# Patient Record
Sex: Female | Born: 1960 | Race: Black or African American | Hispanic: No | Marital: Married | Smoking: Never smoker
Health system: Southern US, Community
[De-identification: ages and names within clinical notes are randomized; demographics above are authoritative.]

## PROBLEM LIST (undated history)

## (undated) DIAGNOSIS — G4733 Obstructive sleep apnea (adult) (pediatric): Secondary | ICD-10-CM

## (undated) DIAGNOSIS — R0602 Shortness of breath: Secondary | ICD-10-CM

## (undated) DIAGNOSIS — M1990 Unspecified osteoarthritis, unspecified site: Secondary | ICD-10-CM

## (undated) DIAGNOSIS — I9589 Other hypotension: Secondary | ICD-10-CM

## (undated) DIAGNOSIS — D509 Iron deficiency anemia, unspecified: Secondary | ICD-10-CM

## (undated) DIAGNOSIS — E669 Obesity, unspecified: Secondary | ICD-10-CM

## (undated) DIAGNOSIS — Z9989 Dependence on other enabling machines and devices: Secondary | ICD-10-CM

## (undated) DIAGNOSIS — K219 Gastro-esophageal reflux disease without esophagitis: Secondary | ICD-10-CM

## (undated) DIAGNOSIS — N186 End stage renal disease: Secondary | ICD-10-CM

## (undated) DIAGNOSIS — Z992 Dependence on renal dialysis: Secondary | ICD-10-CM

## (undated) DIAGNOSIS — I10 Essential (primary) hypertension: Secondary | ICD-10-CM

## (undated) DIAGNOSIS — E1169 Type 2 diabetes mellitus with other specified complication: Secondary | ICD-10-CM

## (undated) DIAGNOSIS — I2699 Other pulmonary embolism without acute cor pulmonale: Secondary | ICD-10-CM

## (undated) DIAGNOSIS — I871 Compression of vein: Secondary | ICD-10-CM

## (undated) DIAGNOSIS — T4145XA Adverse effect of unspecified anesthetic, initial encounter: Secondary | ICD-10-CM

## (undated) DIAGNOSIS — J449 Chronic obstructive pulmonary disease, unspecified: Secondary | ICD-10-CM

## (undated) DIAGNOSIS — J189 Pneumonia, unspecified organism: Secondary | ICD-10-CM

## (undated) HISTORY — PX: THROMBECTOMY AND REVISION OF ARTERIOVENTOUS (AV) GORETEX  GRAFT: SHX6120

## (undated) HISTORY — PX: CARPAL TUNNEL RELEASE: SHX101

## (undated) HISTORY — PX: ARTERIOVENOUS GRAFT PLACEMENT: SUR1029

## (undated) HISTORY — PX: VASCULAR SURGERY: SHX849

---

## 1992-12-10 DIAGNOSIS — Z992 Dependence on renal dialysis: Secondary | ICD-10-CM | POA: Insufficient documentation

## 1993-02-07 HISTORY — PX: PARATHYROIDECTOMY: SHX19

## 1998-12-14 DIAGNOSIS — Z9851 Tubal ligation status: Secondary | ICD-10-CM | POA: Insufficient documentation

## 1998-12-14 DIAGNOSIS — I120 Hypertensive chronic kidney disease with stage 5 chronic kidney disease or end stage renal disease: Secondary | ICD-10-CM | POA: Insufficient documentation

## 1998-12-14 DIAGNOSIS — G5600 Carpal tunnel syndrome, unspecified upper limb: Secondary | ICD-10-CM | POA: Insufficient documentation

## 1998-12-14 DIAGNOSIS — N269 Renal sclerosis, unspecified: Secondary | ICD-10-CM | POA: Insufficient documentation

## 1999-05-17 ENCOUNTER — Ambulatory Visit (HOSPITAL_COMMUNITY): Admission: RE | Admit: 1999-05-17 | Discharge: 1999-05-17 | Payer: Self-pay | Admitting: Nephrology

## 2000-03-20 ENCOUNTER — Ambulatory Visit: Admission: RE | Admit: 2000-03-20 | Discharge: 2000-03-20 | Payer: Self-pay | Admitting: Internal Medicine

## 2000-11-11 ENCOUNTER — Encounter: Admission: RE | Admit: 2000-11-11 | Discharge: 2001-02-09 | Payer: Self-pay | Admitting: *Deleted

## 2001-02-27 ENCOUNTER — Emergency Department (HOSPITAL_COMMUNITY): Admission: EM | Admit: 2001-02-27 | Discharge: 2001-02-27 | Payer: Self-pay | Admitting: Emergency Medicine

## 2001-02-28 ENCOUNTER — Encounter: Payer: Self-pay | Admitting: Nephrology

## 2001-02-28 ENCOUNTER — Inpatient Hospital Stay (HOSPITAL_COMMUNITY): Admission: EM | Admit: 2001-02-28 | Discharge: 2001-03-02 | Payer: Self-pay | Admitting: Emergency Medicine

## 2001-03-01 ENCOUNTER — Encounter: Payer: Self-pay | Admitting: Nephrology

## 2001-03-19 ENCOUNTER — Ambulatory Visit (HOSPITAL_COMMUNITY): Admission: RE | Admit: 2001-03-19 | Discharge: 2001-03-19 | Payer: Self-pay | Admitting: Nephrology

## 2001-03-19 ENCOUNTER — Encounter: Payer: Self-pay | Admitting: Nephrology

## 2001-05-16 ENCOUNTER — Encounter: Payer: Self-pay | Admitting: Nephrology

## 2001-05-16 ENCOUNTER — Encounter: Admission: RE | Admit: 2001-05-16 | Discharge: 2001-05-16 | Payer: Self-pay | Admitting: Nephrology

## 2001-07-02 ENCOUNTER — Ambulatory Visit (HOSPITAL_COMMUNITY): Admission: RE | Admit: 2001-07-02 | Discharge: 2001-07-02 | Payer: Self-pay | Admitting: Nephrology

## 2002-11-17 ENCOUNTER — Ambulatory Visit (HOSPITAL_COMMUNITY): Admission: RE | Admit: 2002-11-17 | Discharge: 2002-11-17 | Payer: Self-pay | Admitting: Nephrology

## 2003-01-25 ENCOUNTER — Encounter: Payer: Self-pay | Admitting: Vascular Surgery

## 2003-01-25 ENCOUNTER — Ambulatory Visit (HOSPITAL_COMMUNITY): Admission: RE | Admit: 2003-01-25 | Discharge: 2003-01-26 | Payer: Self-pay | Admitting: Vascular Surgery

## 2003-03-08 ENCOUNTER — Ambulatory Visit (HOSPITAL_COMMUNITY): Admission: RE | Admit: 2003-03-08 | Discharge: 2003-03-08 | Payer: Self-pay | Admitting: Nephrology

## 2003-03-19 ENCOUNTER — Emergency Department (HOSPITAL_COMMUNITY): Admission: EM | Admit: 2003-03-19 | Discharge: 2003-03-19 | Payer: Self-pay | Admitting: Emergency Medicine

## 2003-04-28 ENCOUNTER — Ambulatory Visit (HOSPITAL_COMMUNITY): Admission: RE | Admit: 2003-04-28 | Discharge: 2003-04-28 | Payer: Self-pay | Admitting: Nephrology

## 2003-05-14 ENCOUNTER — Ambulatory Visit (HOSPITAL_COMMUNITY): Admission: RE | Admit: 2003-05-14 | Discharge: 2003-05-14 | Payer: Self-pay | Admitting: Nephrology

## 2003-05-14 ENCOUNTER — Encounter: Payer: Self-pay | Admitting: Nephrology

## 2003-05-17 ENCOUNTER — Ambulatory Visit (HOSPITAL_COMMUNITY): Admission: RE | Admit: 2003-05-17 | Discharge: 2003-05-17 | Payer: Self-pay | Admitting: Vascular Surgery

## 2003-06-02 ENCOUNTER — Encounter: Payer: Self-pay | Admitting: Vascular Surgery

## 2003-06-02 ENCOUNTER — Ambulatory Visit (HOSPITAL_COMMUNITY): Admission: RE | Admit: 2003-06-02 | Discharge: 2003-06-02 | Payer: Self-pay | Admitting: Vascular Surgery

## 2003-06-04 ENCOUNTER — Ambulatory Visit (HOSPITAL_COMMUNITY): Admission: RE | Admit: 2003-06-04 | Discharge: 2003-06-04 | Payer: Self-pay | Admitting: Vascular Surgery

## 2003-07-21 ENCOUNTER — Ambulatory Visit (HOSPITAL_COMMUNITY): Admission: RE | Admit: 2003-07-21 | Discharge: 2003-07-21 | Payer: Self-pay | Admitting: *Deleted

## 2003-08-11 ENCOUNTER — Encounter: Payer: Self-pay | Admitting: Nephrology

## 2003-08-11 ENCOUNTER — Ambulatory Visit (HOSPITAL_COMMUNITY): Admission: RE | Admit: 2003-08-11 | Discharge: 2003-08-11 | Payer: Self-pay | Admitting: Nephrology

## 2004-01-24 ENCOUNTER — Ambulatory Visit (HOSPITAL_COMMUNITY): Admission: RE | Admit: 2004-01-24 | Discharge: 2004-01-24 | Payer: Self-pay | Admitting: Nephrology

## 2004-01-25 ENCOUNTER — Inpatient Hospital Stay (HOSPITAL_COMMUNITY): Admission: RE | Admit: 2004-01-25 | Discharge: 2004-01-27 | Payer: Self-pay | Admitting: Vascular Surgery

## 2004-02-28 ENCOUNTER — Ambulatory Visit (HOSPITAL_COMMUNITY): Admission: RE | Admit: 2004-02-28 | Discharge: 2004-02-28 | Payer: Self-pay | Admitting: Nephrology

## 2004-03-02 ENCOUNTER — Ambulatory Visit (HOSPITAL_COMMUNITY): Admission: RE | Admit: 2004-03-02 | Discharge: 2004-03-02 | Payer: Self-pay | Admitting: Vascular Surgery

## 2004-03-22 ENCOUNTER — Ambulatory Visit (HOSPITAL_COMMUNITY): Admission: RE | Admit: 2004-03-22 | Discharge: 2004-03-22 | Payer: Self-pay | Admitting: Nephrology

## 2004-03-23 ENCOUNTER — Encounter: Payer: Self-pay | Admitting: Vascular Surgery

## 2004-03-23 ENCOUNTER — Inpatient Hospital Stay (HOSPITAL_COMMUNITY): Admission: AD | Admit: 2004-03-23 | Discharge: 2004-03-23 | Payer: Self-pay | Admitting: Nephrology

## 2004-05-19 ENCOUNTER — Ambulatory Visit (HOSPITAL_COMMUNITY): Admission: RE | Admit: 2004-05-19 | Discharge: 2004-05-21 | Payer: Self-pay | Admitting: Vascular Surgery

## 2004-06-17 ENCOUNTER — Inpatient Hospital Stay (HOSPITAL_COMMUNITY): Admission: EM | Admit: 2004-06-17 | Discharge: 2004-06-22 | Payer: Self-pay | Admitting: Emergency Medicine

## 2004-09-22 ENCOUNTER — Ambulatory Visit (HOSPITAL_COMMUNITY): Admission: RE | Admit: 2004-09-22 | Discharge: 2004-09-22 | Payer: Self-pay | Admitting: Nephrology

## 2004-09-28 ENCOUNTER — Ambulatory Visit (HOSPITAL_COMMUNITY)
Admission: RE | Admit: 2004-09-28 | Discharge: 2004-09-28 | Payer: Self-pay | Admitting: Physical Medicine & Rehabilitation

## 2004-09-29 ENCOUNTER — Inpatient Hospital Stay (HOSPITAL_COMMUNITY): Admission: AD | Admit: 2004-09-29 | Discharge: 2004-10-06 | Payer: Self-pay | Admitting: Nephrology

## 2004-10-09 ENCOUNTER — Inpatient Hospital Stay (HOSPITAL_COMMUNITY): Admission: AD | Admit: 2004-10-09 | Discharge: 2004-10-11 | Payer: Self-pay | Admitting: Vascular Surgery

## 2004-10-10 ENCOUNTER — Encounter (INDEPENDENT_AMBULATORY_CARE_PROVIDER_SITE_OTHER): Payer: Self-pay | Admitting: Cardiology

## 2004-11-15 ENCOUNTER — Ambulatory Visit (HOSPITAL_COMMUNITY): Admission: RE | Admit: 2004-11-15 | Discharge: 2004-11-16 | Payer: Self-pay | Admitting: Nephrology

## 2004-12-13 ENCOUNTER — Ambulatory Visit (HOSPITAL_COMMUNITY): Admission: RE | Admit: 2004-12-13 | Discharge: 2004-12-13 | Payer: Self-pay | Admitting: Vascular Surgery

## 2005-01-11 ENCOUNTER — Inpatient Hospital Stay (HOSPITAL_COMMUNITY): Admission: AD | Admit: 2005-01-11 | Discharge: 2005-01-15 | Payer: Self-pay | Admitting: *Deleted

## 2005-01-11 ENCOUNTER — Encounter: Payer: Self-pay | Admitting: Nephrology

## 2005-01-29 ENCOUNTER — Ambulatory Visit (HOSPITAL_COMMUNITY): Admission: RE | Admit: 2005-01-29 | Discharge: 2005-01-29 | Payer: Self-pay | Admitting: Nephrology

## 2005-02-10 ENCOUNTER — Ambulatory Visit (HOSPITAL_COMMUNITY): Admission: AD | Admit: 2005-02-10 | Discharge: 2005-02-10 | Payer: Self-pay | Admitting: Vascular Surgery

## 2005-02-28 ENCOUNTER — Ambulatory Visit (HOSPITAL_COMMUNITY): Admission: RE | Admit: 2005-02-28 | Discharge: 2005-02-28 | Payer: Self-pay | Admitting: Nephrology

## 2005-03-14 ENCOUNTER — Ambulatory Visit (HOSPITAL_COMMUNITY): Admission: RE | Admit: 2005-03-14 | Discharge: 2005-03-15 | Payer: Self-pay | Admitting: Nephrology

## 2005-03-28 ENCOUNTER — Ambulatory Visit (HOSPITAL_COMMUNITY): Admission: RE | Admit: 2005-03-28 | Discharge: 2005-03-28 | Payer: Self-pay | Admitting: Nephrology

## 2005-04-20 ENCOUNTER — Ambulatory Visit (HOSPITAL_COMMUNITY): Admission: RE | Admit: 2005-04-20 | Discharge: 2005-04-20 | Payer: Self-pay | Admitting: Vascular Surgery

## 2005-04-27 ENCOUNTER — Ambulatory Visit (HOSPITAL_COMMUNITY): Admission: RE | Admit: 2005-04-27 | Discharge: 2005-04-27 | Payer: Self-pay | Admitting: Nephrology

## 2005-04-30 ENCOUNTER — Ambulatory Visit (HOSPITAL_COMMUNITY): Admission: RE | Admit: 2005-04-30 | Discharge: 2005-04-30 | Payer: Self-pay | Admitting: Vascular Surgery

## 2005-05-03 ENCOUNTER — Emergency Department (HOSPITAL_COMMUNITY): Admission: EM | Admit: 2005-05-03 | Discharge: 2005-05-03 | Payer: Self-pay | Admitting: Emergency Medicine

## 2005-05-09 ENCOUNTER — Inpatient Hospital Stay (HOSPITAL_COMMUNITY): Admission: EM | Admit: 2005-05-09 | Discharge: 2005-05-13 | Payer: Self-pay | Admitting: Emergency Medicine

## 2005-06-06 IMAGING — XA IR VENTRICULOPERITONEALSHUNT EVAL/INJ
1 series · 15 of 24 positions shown · non-contrast
Comparison: none

CLINICAL DATA: The patient has a history of end stage renal disease and is dialyzed through a left thigh gortex graft.  She has decreased clearances from the graft during hemodialysis.
PROCEDURE
LEFT THIGH DIALYSIS LEFT AV SHUNTOGRAM
ANGIOPLASTY OF ARTERIAL ANASTOMOSIS
ANGIOPLASTY OF VENOUS LIMB OF DIALYSIS GRAFT, 01/24/04
Comparison prior shuntogram performed at [HOSPITAL] on 08/11/03.
LEFT THIGH AV SHUNTOGRAM
An 18-gauge Angiocath was introduced along the arterial side of a loop graft in the left thigh. Contrast shuntogram was performed with full visualization of venous outflow through to the level of the right atrium of the heart.  The arterial anastomosis was studied during temporary pressure held at the apex of the graft.

[Series 1: run · 15 of 38 slices shown]
[im 1/38]
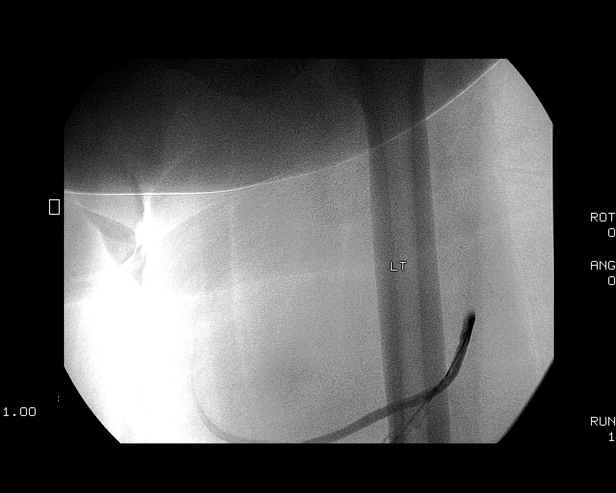
[im 4/38]
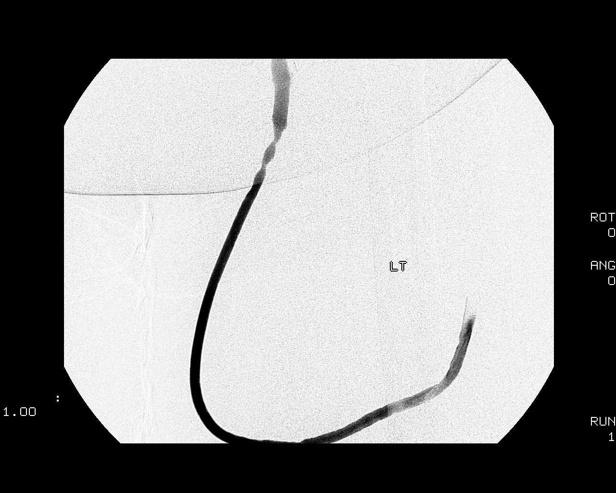
[im 7/38]
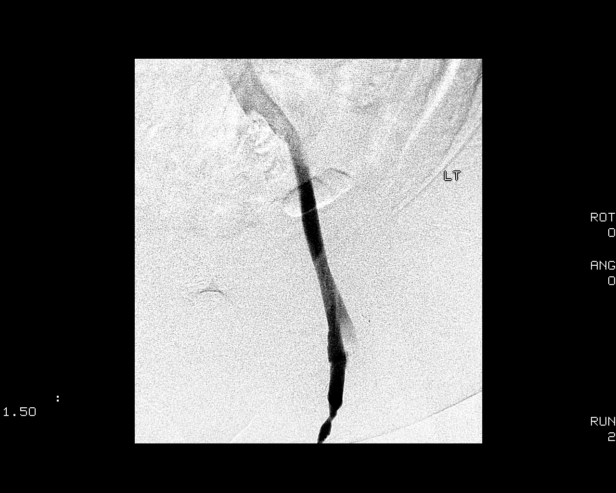
[im 9/38]
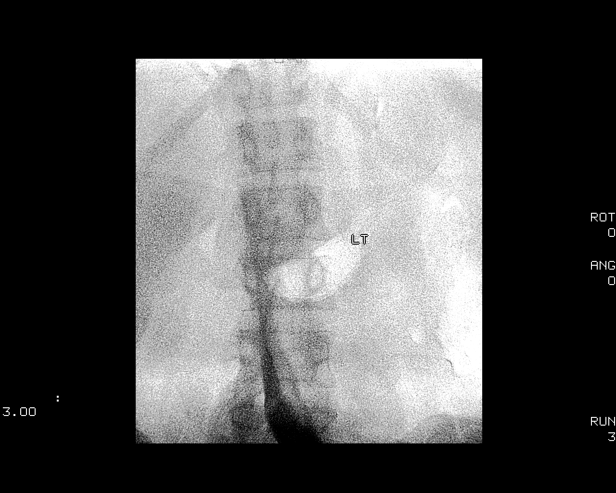
[im 12/38]
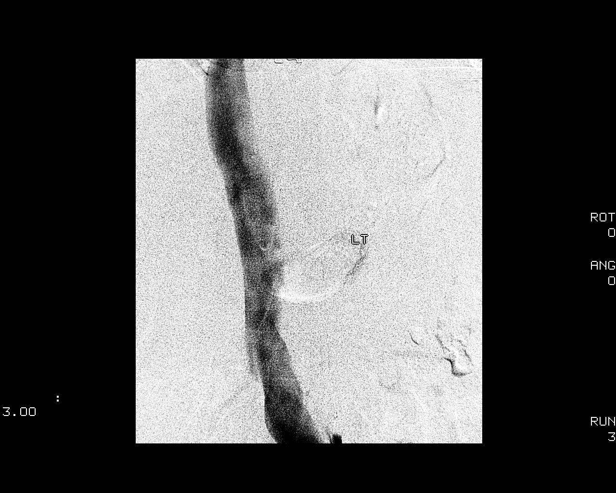
[im 13/38]
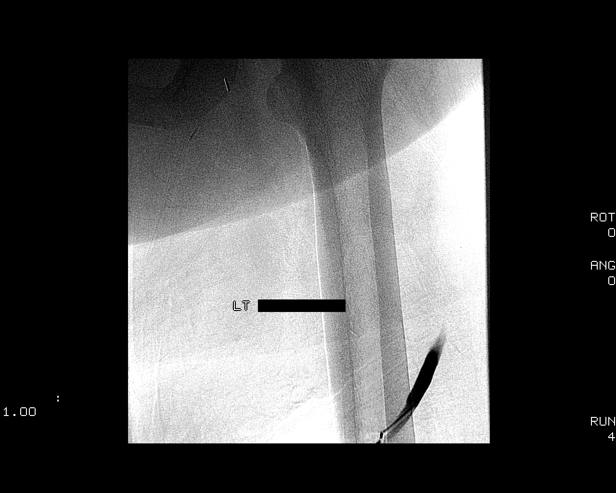
[im 17/38]
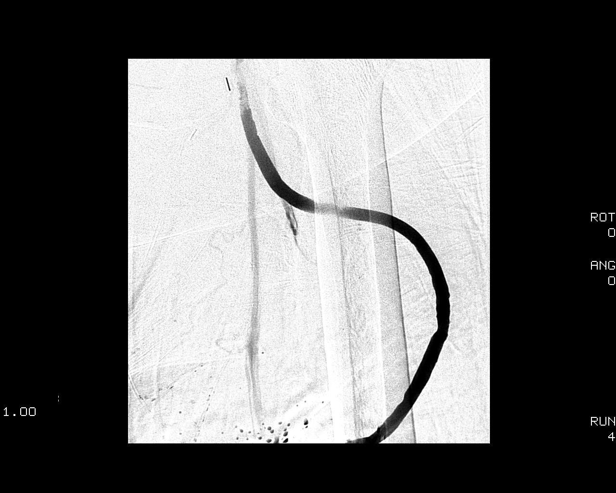
[im 20/38]
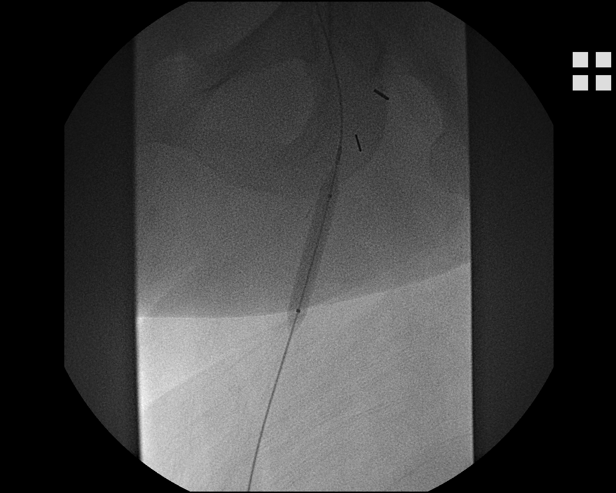
[im 21/38]
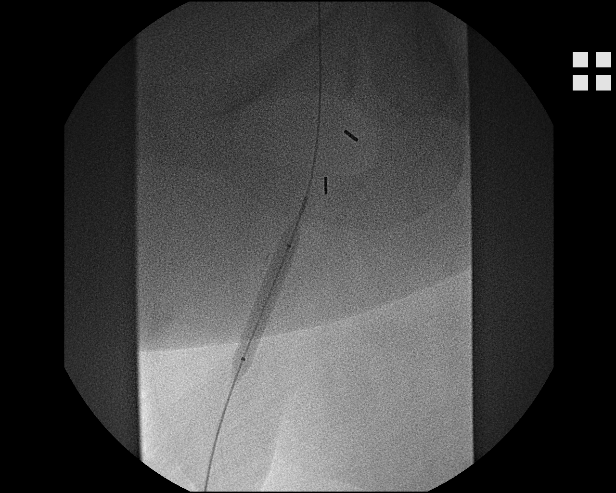
[im 25/38]
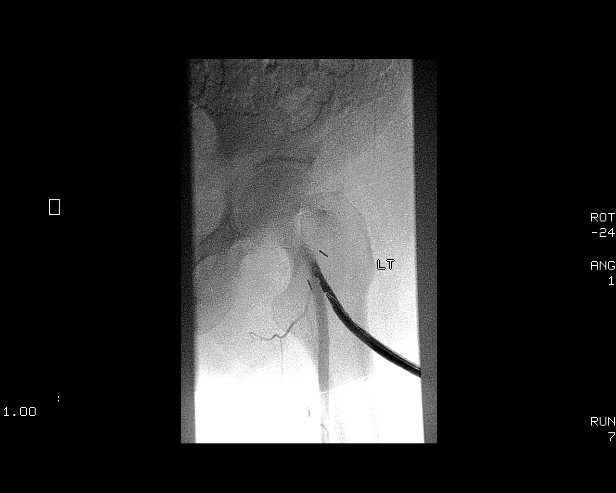
[im 26/38]
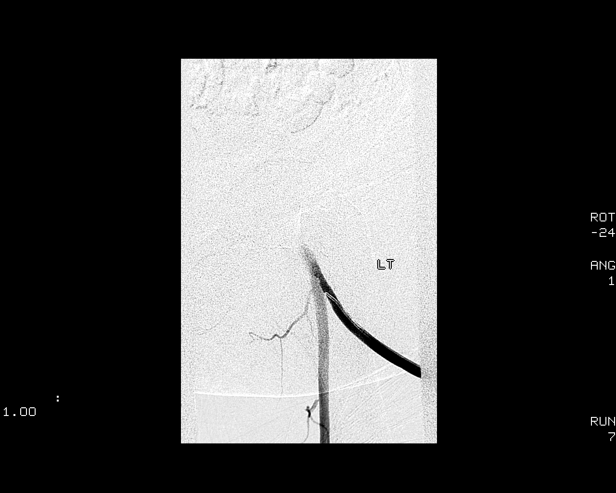
[im 29/38]
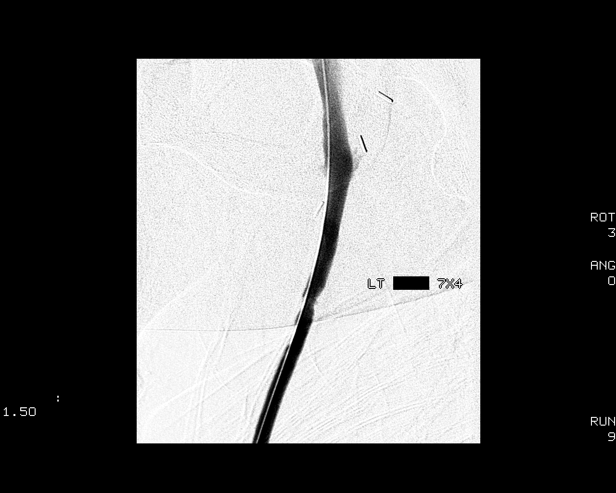
[im 33/38]
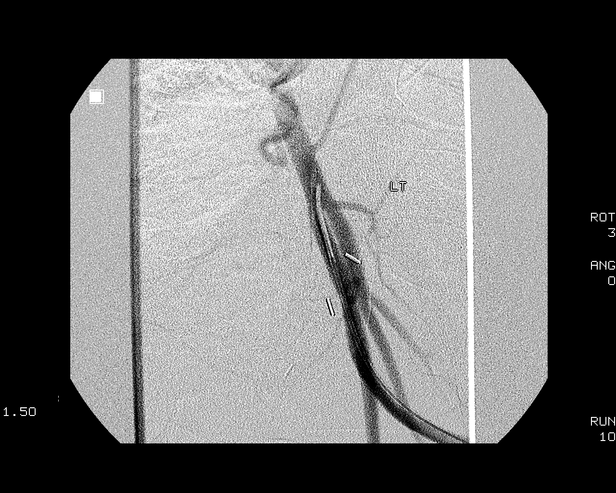
[im 34/38]
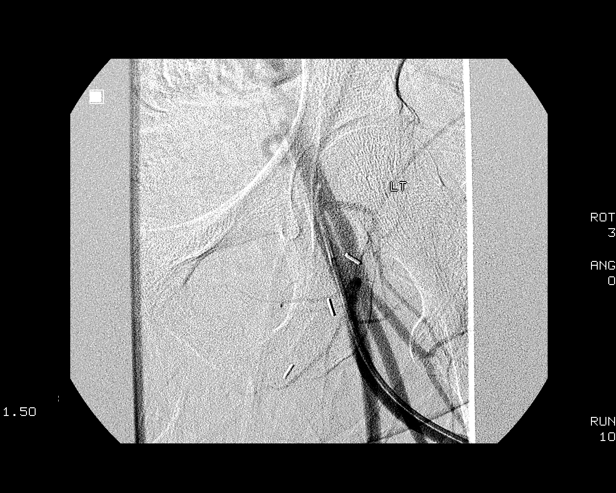
[im 38/38]
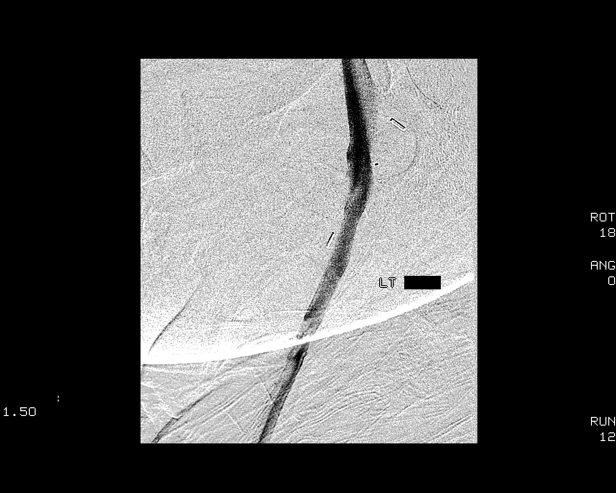

[15 of 24 positions shown; findings below may reference images not displayed]

FINDINGS: There are two significant tandem areas of stenosis, each approaching 75 percent in caliber within the distal end of the loop graft just prior to the venous anastomosis.  These are new when compared to the prior shuntogram.  The rest of the venous outflow is widely patent including the entire IVC.  
There remains irregular stenosis at the arterial anastomosis of the graft which was further characterized with a diagnostic catheter injection prior to angioplasty.  This stenosis does appear to have progressed since the prior shuntogram in [DATE] and is now estimated to be approximately 75 percent in caliber.  
IMPRESSION 
Significant stenosis at the arterial anastomosis which has progressed since the prior shuntogram and now is estimated at 75 percent.  
Tandem areas of 75 percent stenosis involving the distal end of the bypass graft just proximal to the venous anastomosis.
ANGIOPLASTY OF ARTERIAL ANASTOMOSIS
From a retrograde stick along the arterial limb of the graft used for the shuntogram, a guidewire was advanced into the graft.  A 6 French vascular sheath was placed.  A diagnostic catheter was advanced to the level of the arterial anastomosis and additional oblique angiography performed.  Balloon angioplasty was then performed across the arterial anastomosis utilizing a 6 mm x 4 cm high pressure Conquest angioplasty balloon.  This was inflated a single time then removed.  Additional follow-up angiography was then performed through a diagnostic catheter advanced just below the arterial anastomosis.
Prior to angioplasty, the patient received 4999 units of heparin through the graft.
FINDINGS: The arterial anastomotic stenosis did show response to angioplasty improving from an irregular roughly 75 percent stenosis to a final mild and smoother estimated 30 percent stenosis.  Although the graft material is 7 mm in diameter, the patient did experience focal pain during inflation of a 6 mm balloon and further dilatation to 7 mm was not performed, given the improved result.  
At some point after angioplasty of the arterial anastomosis, the patient did start to experience an allergic type reaction, complaining of throat swelling and itching.  This did improve after administration of 50 mg of Benadryl directly into the graft.  It is unclear whether this mild allergic response occurred as a result of the heparin administered or contrast.  The patient was observed for over one hour following the procedure and was stable.
IMPRESSION 
Improvement in stenosis of the arterial anastomosis after 6 mm angioplasty with reduction in stenosis from 75 percent to approximately 30 percent final residual estimated stenosis.  As above, the patient did have a mild allergic reaction to either heparin or contrast which did respond to administration of intravenous Benadryl.
ANGIOPLASTY OF VENOUS LIMB OF DIALYSIS GRAFT
A second puncture of the dialysis graft was performed near the apex in an antegrade direction with a micropuncture needle.  Eventually, a 6 French sheath was placed over a guidewire and angiography perfor[REDACTED]ed over the distal segment of the thigh graft.  Balloon angioplasty was performed of   the tandem areas of stenosis within the distal graft utilizing a 7 mm x 4 cm Conquest angioplasty balloon.  After two sustained inflations, the balloon was removed and repeat angiography performed through the sheath.
Upon completion of the procedure, both 6 French sheaths placed into the graft were removed and 0 silk purse string sutures at the exit sites.
FINDINGS: Areas of stenosis within the distal graft did respond to 7 mm angioplasty.  The higher of the two areas of stenosis showed essentially complete response with no residual stenosis remaining.  The area of stenosis lower in the thigh did show improvement with residual 25 to 30 percent stenosis remaining.  
IMPRESSION 
Improvement in tandem areas of stenosis within the distal segment of the dialysis graft.  One of these areas completely resolved after 7 mm angioplasty and the second area showed mild residual 25 to 30 percent stenosis remaining after balloon angioplasty.
cc: [REDACTED]

## 2005-06-08 IMAGING — CR DG CHEST 1V PORT
1 series · 1 of 1 positions shown · non-contrast
Comparison: 06/02/03.

CLINICAL DATA: Diatek catheter insertion.   
C-ARM FLUOROSCOPY 1-60 MINUTES
Fluoroscopy was utilized by the requesting physician.
IMPRESSION
No radiographic interpretation.
PORTABLE CHEST

[view not recorded]
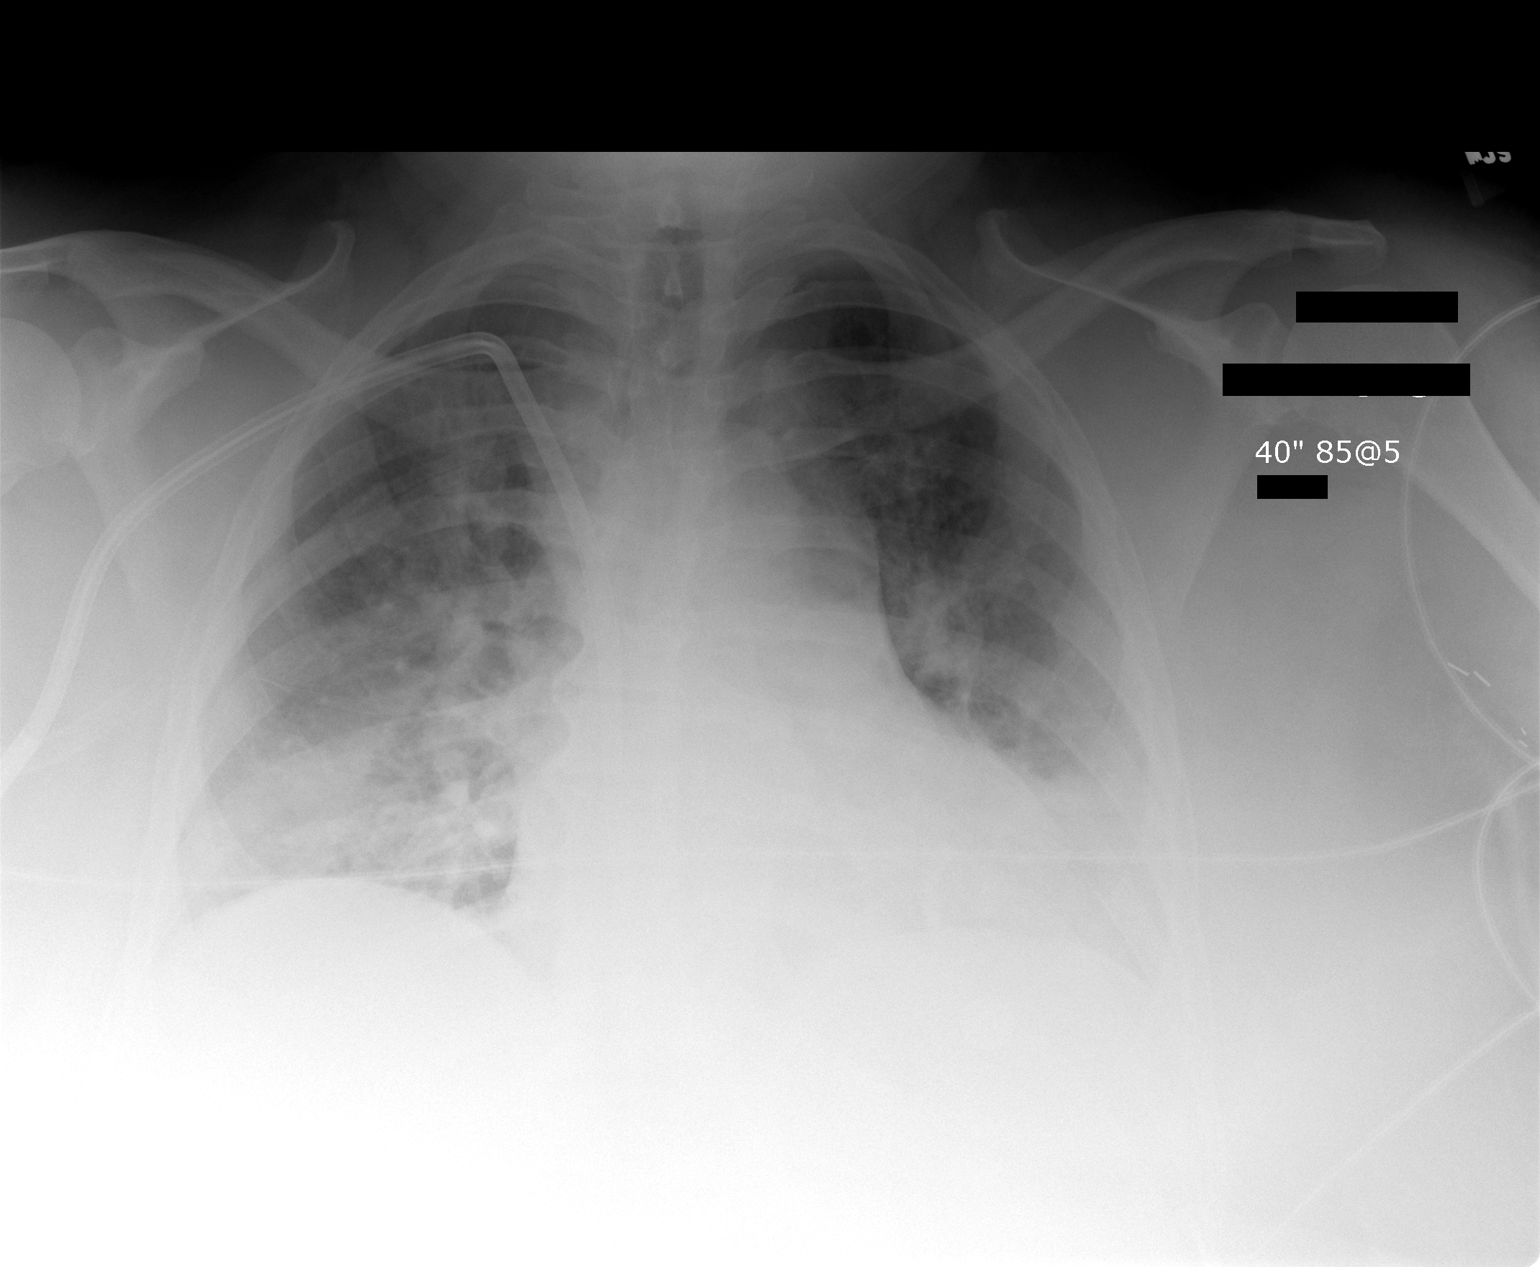

[1 of 1 positions shown; findings below may reference images not displayed]

AP semiupright chest obtained at [DATE] hours shows a left subclavian dialysis catheter in place.  The catheter extends into the right atrium although the extreme distal tip of the catheter cannot be resolved.  There is cardiomegaly with pulmonary edema and low lung volumes.  No evidence for pneumothorax. 
IMPRESSION
Right subclavian dialysis catheter extends into the right atrium although the very tip of the catheter cannot be discerned.  There is no evidence for pneumothorax. 
Pulmonary edema

## 2005-06-27 ENCOUNTER — Ambulatory Visit (HOSPITAL_COMMUNITY): Admission: RE | Admit: 2005-06-27 | Discharge: 2005-06-27 | Payer: Self-pay | Admitting: Nephrology

## 2005-06-28 ENCOUNTER — Inpatient Hospital Stay (HOSPITAL_COMMUNITY): Admission: AD | Admit: 2005-06-28 | Discharge: 2005-07-03 | Payer: Self-pay | Admitting: *Deleted

## 2005-08-04 IMAGING — XA IR VENTRICULOPERITONEALSHUNT EVAL/INJ
1 series · 12 of 24 positions shown · non-contrast
Comparison: none

CLINICAL DATA: Left thigh graft thrombosed.  
 LEFT THIGH AV GRAFT THROMBECTOMY AND THROMBOLYSIS, VENOUS ANGIOPLASTY, AV SHUNTOGRAM ([DATE] HOURS)
 Procedure:
 The procedure, its benefits and alternatives were explained to the patient.  She understood and consented. 
 The left thigh was prepped and draped in a sterile fashion.  Lidocaine was utilized for local anesthesia.  A micropuncture needle was inserted into the graft directed towards the venous anastomosis.  An 018 wire was advanced through the needle into the graft.  A micropuncture dilator was advanced over the wire to the graft.  A similar procedure was performed towards the arterial anastomosis.  4 mg TPA was administered into the graft. 
 The micropuncture dilators were exchanged over Bentson wire for six French sheaths.  Kumpe catheters were then utilized to advance Tiger wires across the venous end arterial anastomosis.  Zorniza Gonnelli catheter was advanced over the wire into the left common iliac vein.  Central venography was performed demonstrating patency.
 A 7 mm angioplasty device was then advanced to the venous anastomosis.  Angioplasty was performed. 
 A Fogarty balloon was then utilized to sweep the arterial limb of the graft and arterial plug.  A Fogarty balloon was then utilized to sweep the graft in an antegrade fashion across the venous anastomosis.  Repeat imaging demonstrates a focal persistent stenosis in the arterial limb of the graft.  Repeat angioplasty of the arterial limb of the graft was performed in this focal stenosis.  Repeat imaging demonstrates patency.  The sheaths were removed and hemostasis was achieved with two 3-0 Monocryl figure-of-eight stitches.  No complications were encountered.

[Series 1: run · 12 of 56 slices shown]
[im 3/56]
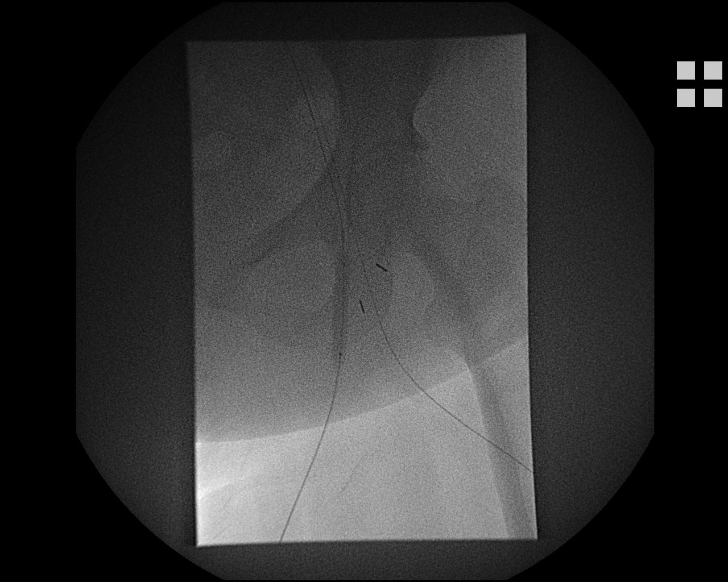
[im 8/56]
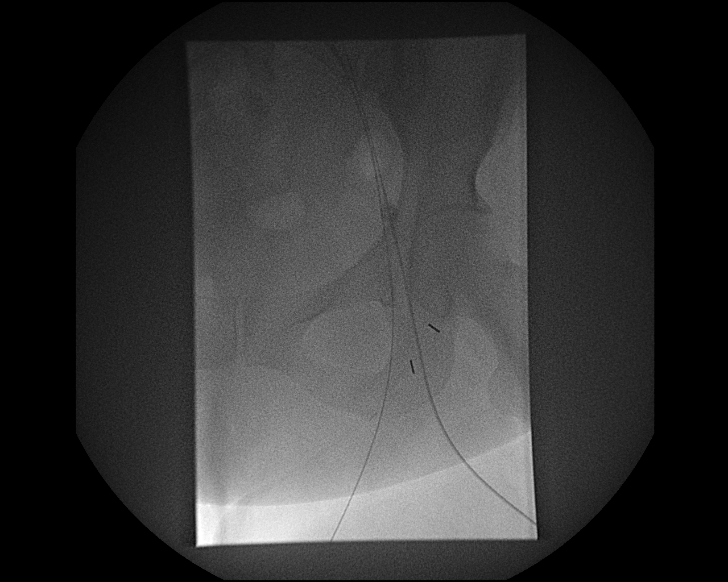
[im 12/56]
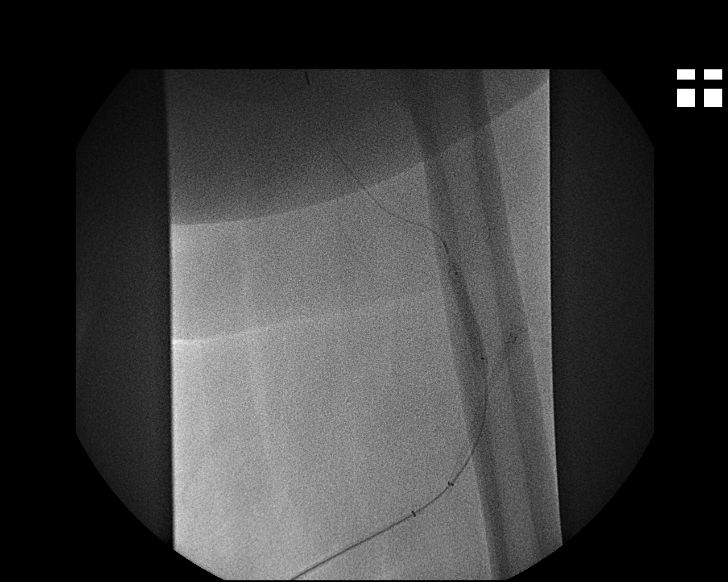
[im 17/56]
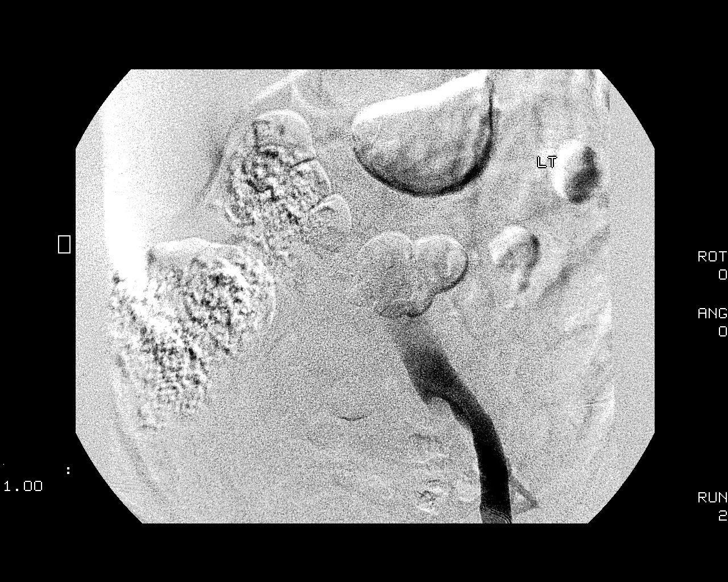
[im 22/56]
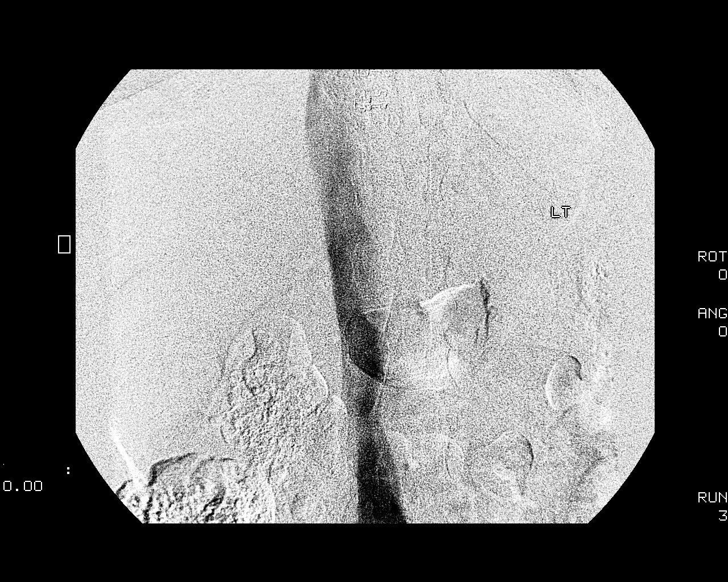
[im 27/56]
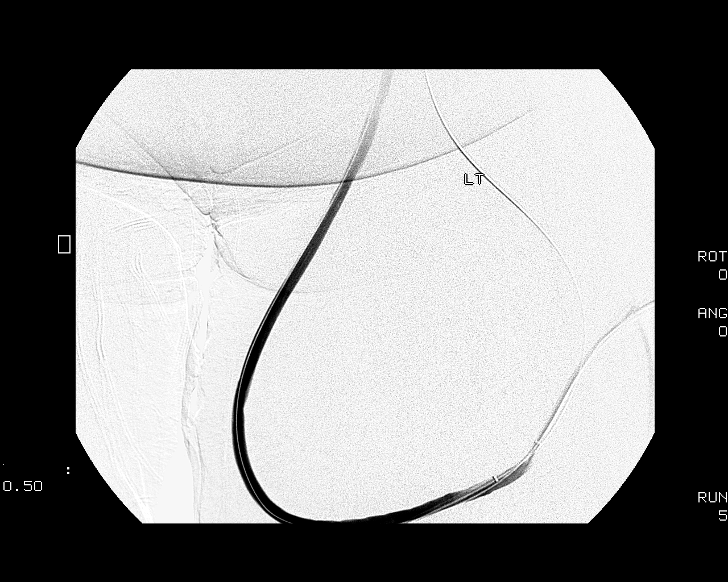
[im 32/56]
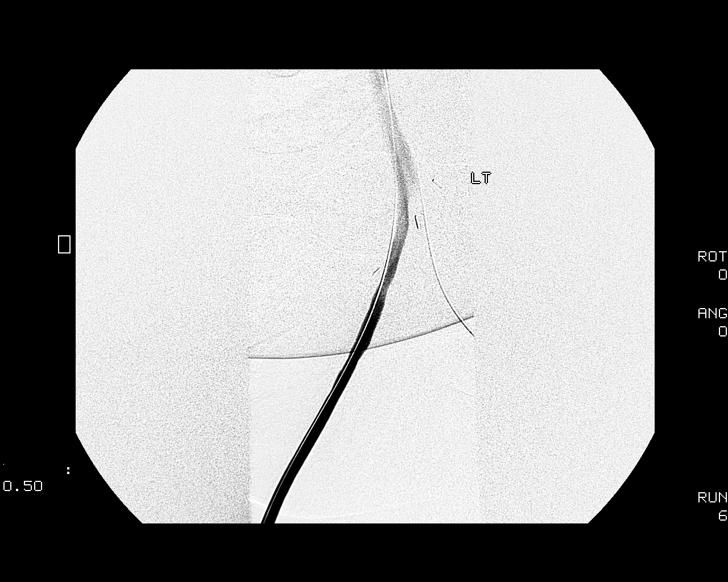
[im 36/56]
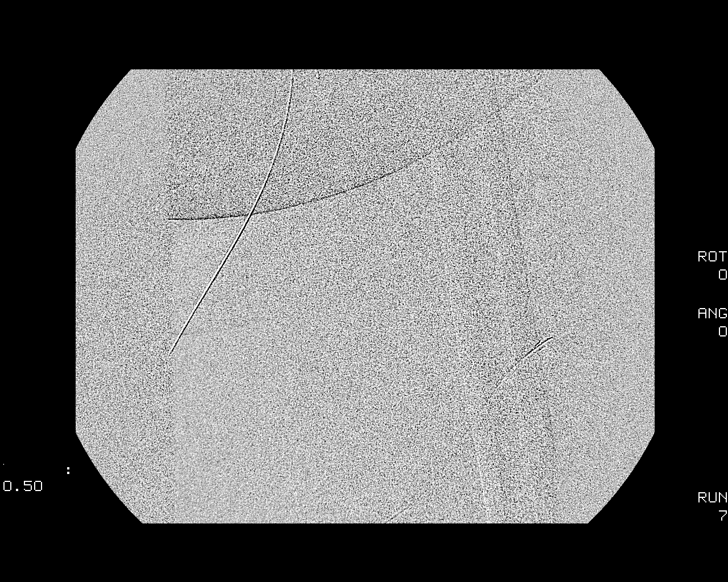
[im 41/56]
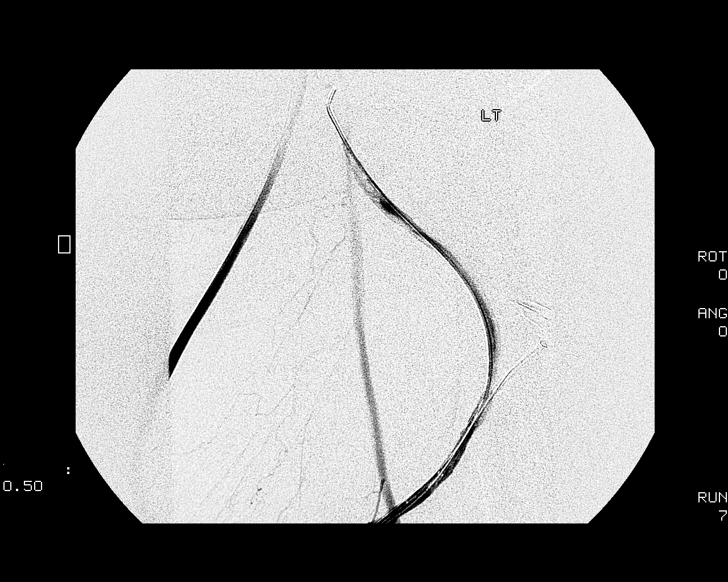
[im 46/56]
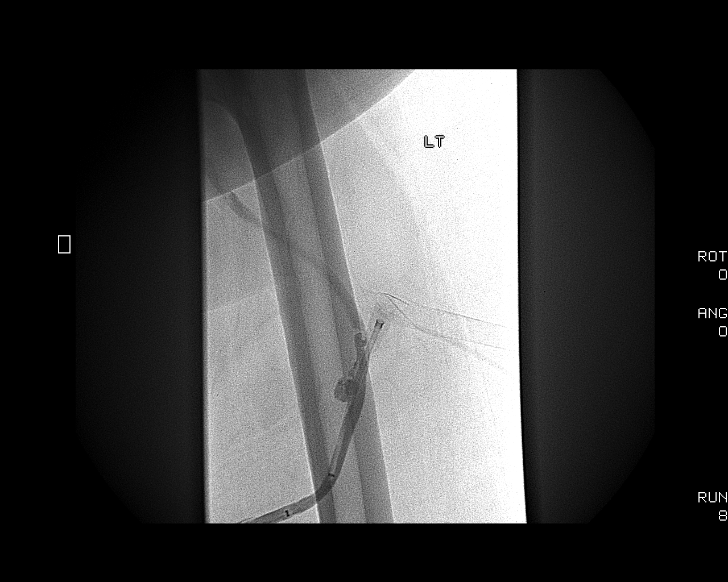
[im 51/56]
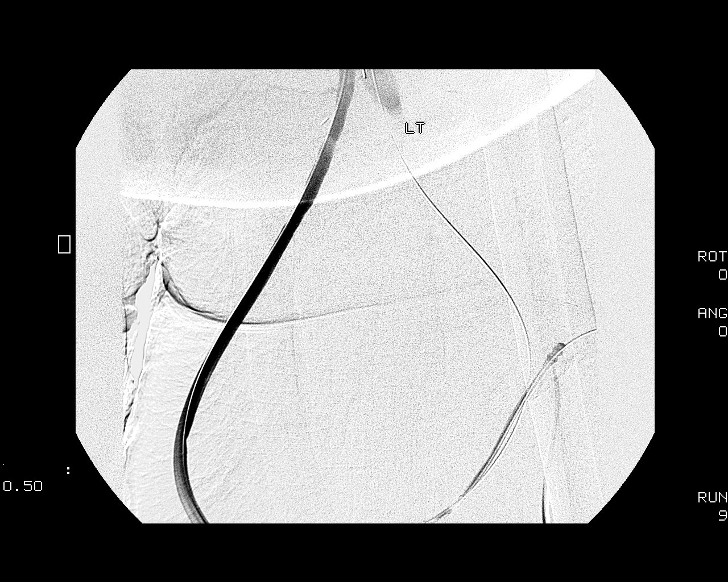
[im 56/56]
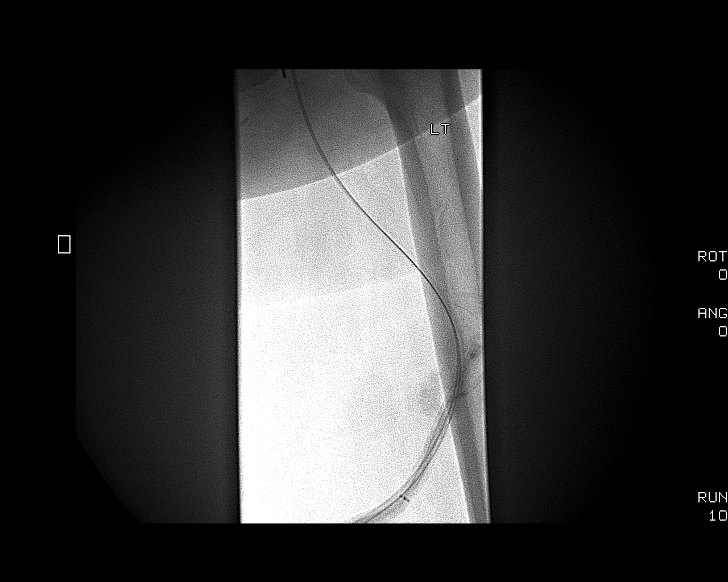

[12 of 24 positions shown; findings below may reference images not displayed]

FINDINGS: Central venography demonstrates patency of the left iliac venous system and IVC. 
 The initial post-thrombectomy angiogram demonstrates patency of the arterial and venous anastomosis and a focal stenosis in the arterial limb of the graft.  
 After angioplasty of the graft stenosis in the arterial limb, post-angioplasty imaging demonstrates wide patency of the graft. 
 IMPRESSION
 Successful left thigh AV graft thrombectomy and venous angioplasty as described.

## 2005-08-04 IMAGING — CR DG CHEST 1V PORT
1 series · 1 of 1 positions shown · non-contrast
Comparison: none

CLINICAL DATA: Clotted graft. Diatek catheter
 PORTABLE CHEST ONE VIEW (0334)
 Right side dialysis catheter is in place.  The tips are in the right atrium.  No pneumothorax.  There is mild vascular congestion and pulmonary edema, which is decreased since the prior study from 01/26/04.
 IMPRESSION
 Right Diatek catheter as above.  No pneumothorax.
 Mild edema.

[view not recorded]
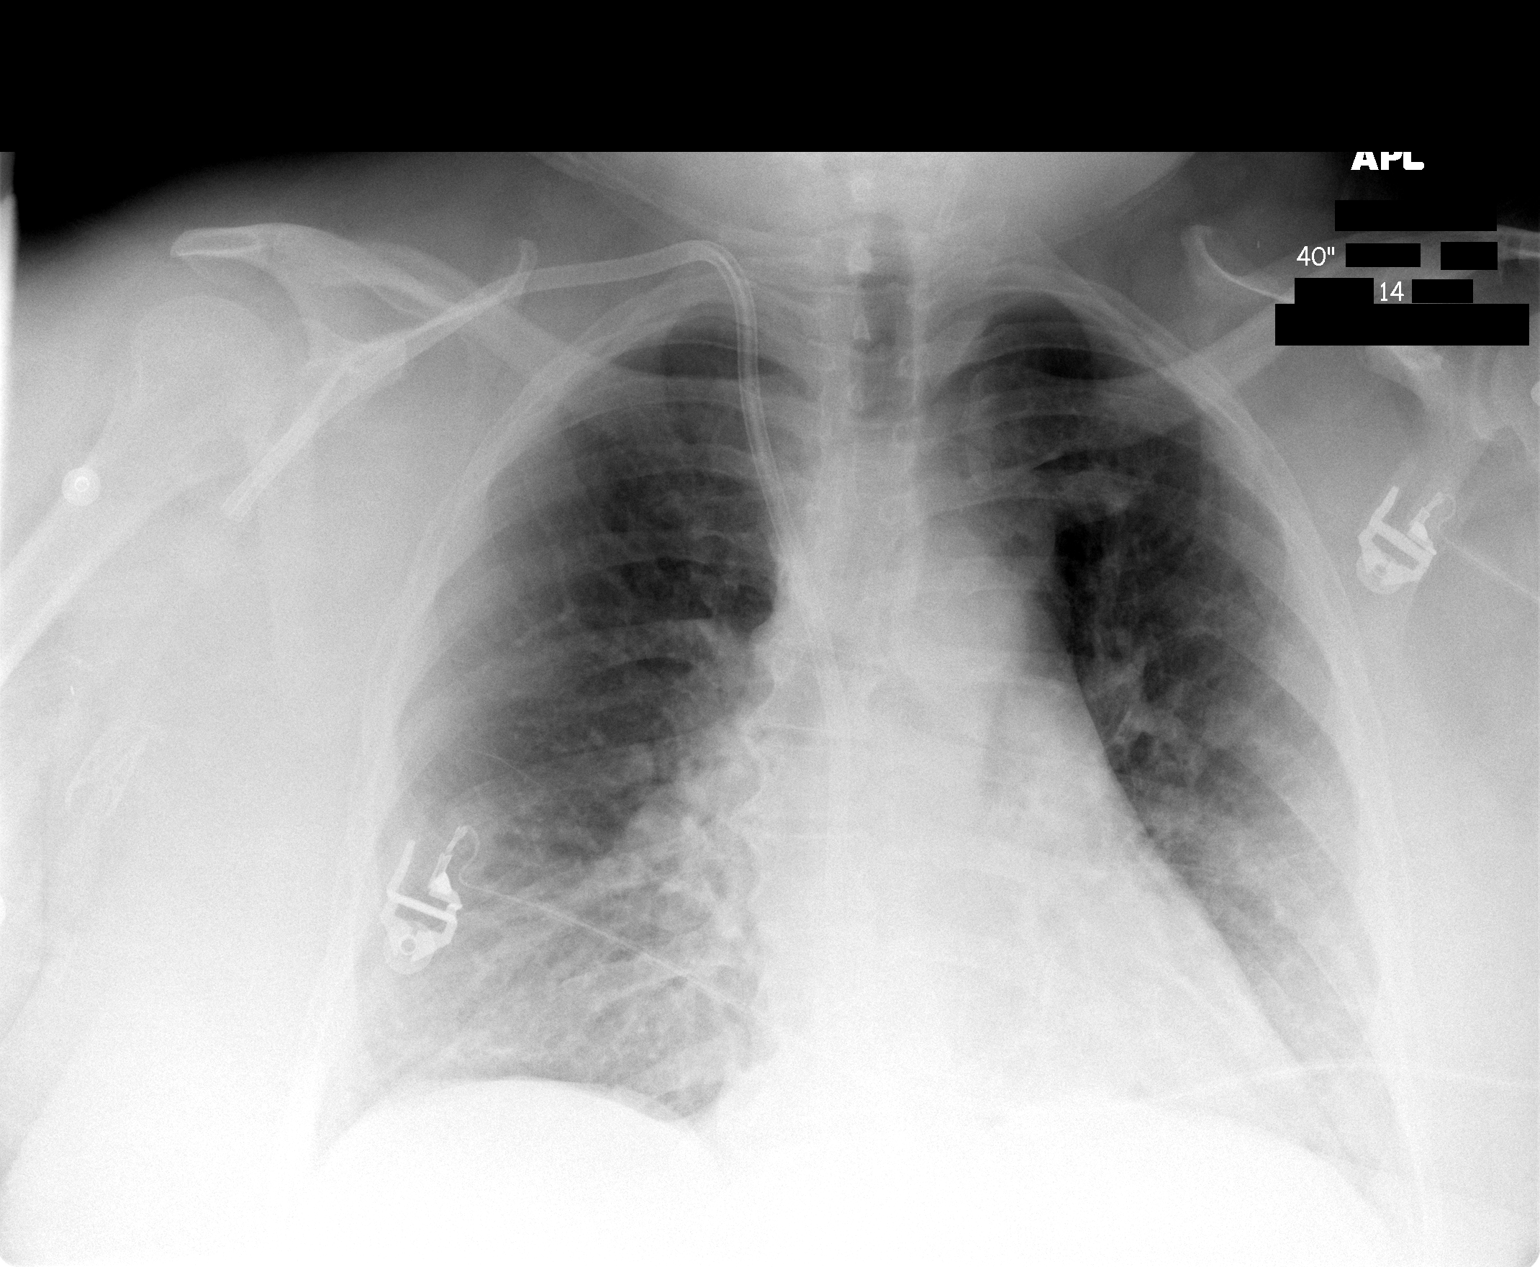

[1 of 1 positions shown; findings below may reference images not displayed]

## 2005-08-17 ENCOUNTER — Ambulatory Visit (HOSPITAL_COMMUNITY): Admission: RE | Admit: 2005-08-17 | Discharge: 2005-08-17 | Payer: Self-pay | Admitting: Nephrology

## 2005-08-28 ENCOUNTER — Inpatient Hospital Stay (HOSPITAL_COMMUNITY): Admission: AD | Admit: 2005-08-28 | Discharge: 2005-09-01 | Payer: Self-pay | Admitting: Nephrology

## 2005-08-29 ENCOUNTER — Ambulatory Visit: Payer: Self-pay | Admitting: Cardiology

## 2005-08-29 ENCOUNTER — Encounter: Payer: Self-pay | Admitting: Cardiology

## 2005-10-10 ENCOUNTER — Emergency Department (HOSPITAL_COMMUNITY): Admission: EM | Admit: 2005-10-10 | Discharge: 2005-10-10 | Payer: Self-pay | Admitting: Emergency Medicine

## 2005-10-12 ENCOUNTER — Ambulatory Visit (HOSPITAL_COMMUNITY): Admission: RE | Admit: 2005-10-12 | Discharge: 2005-10-12 | Payer: Self-pay | Admitting: Vascular Surgery

## 2005-10-29 ENCOUNTER — Inpatient Hospital Stay (HOSPITAL_COMMUNITY): Admission: EM | Admit: 2005-10-29 | Discharge: 2005-11-07 | Payer: Self-pay | Admitting: Emergency Medicine

## 2005-10-29 IMAGING — CR DG CHEST 1V PORT
1 series · 1 of 1 positions shown · non-contrast
Comparison: none

CLINICAL DATA: Dyspnea.  History of asthma.  Renal failure on dialysis.  
 PORTABLE CHEST ONE VIEW
 Portable exam at 0705 hours without priors for comparison.  Right jugular central venous catheter tip SVC.  Numerous cardiac monitoring lines project over chest.  Cardiomegaly.  No gross infiltrate or effusion.  However exam is under penetrated due to patient?s size.  
 IMPRESSION
 Cardiomegaly.  No definite acute process.

[view not recorded]
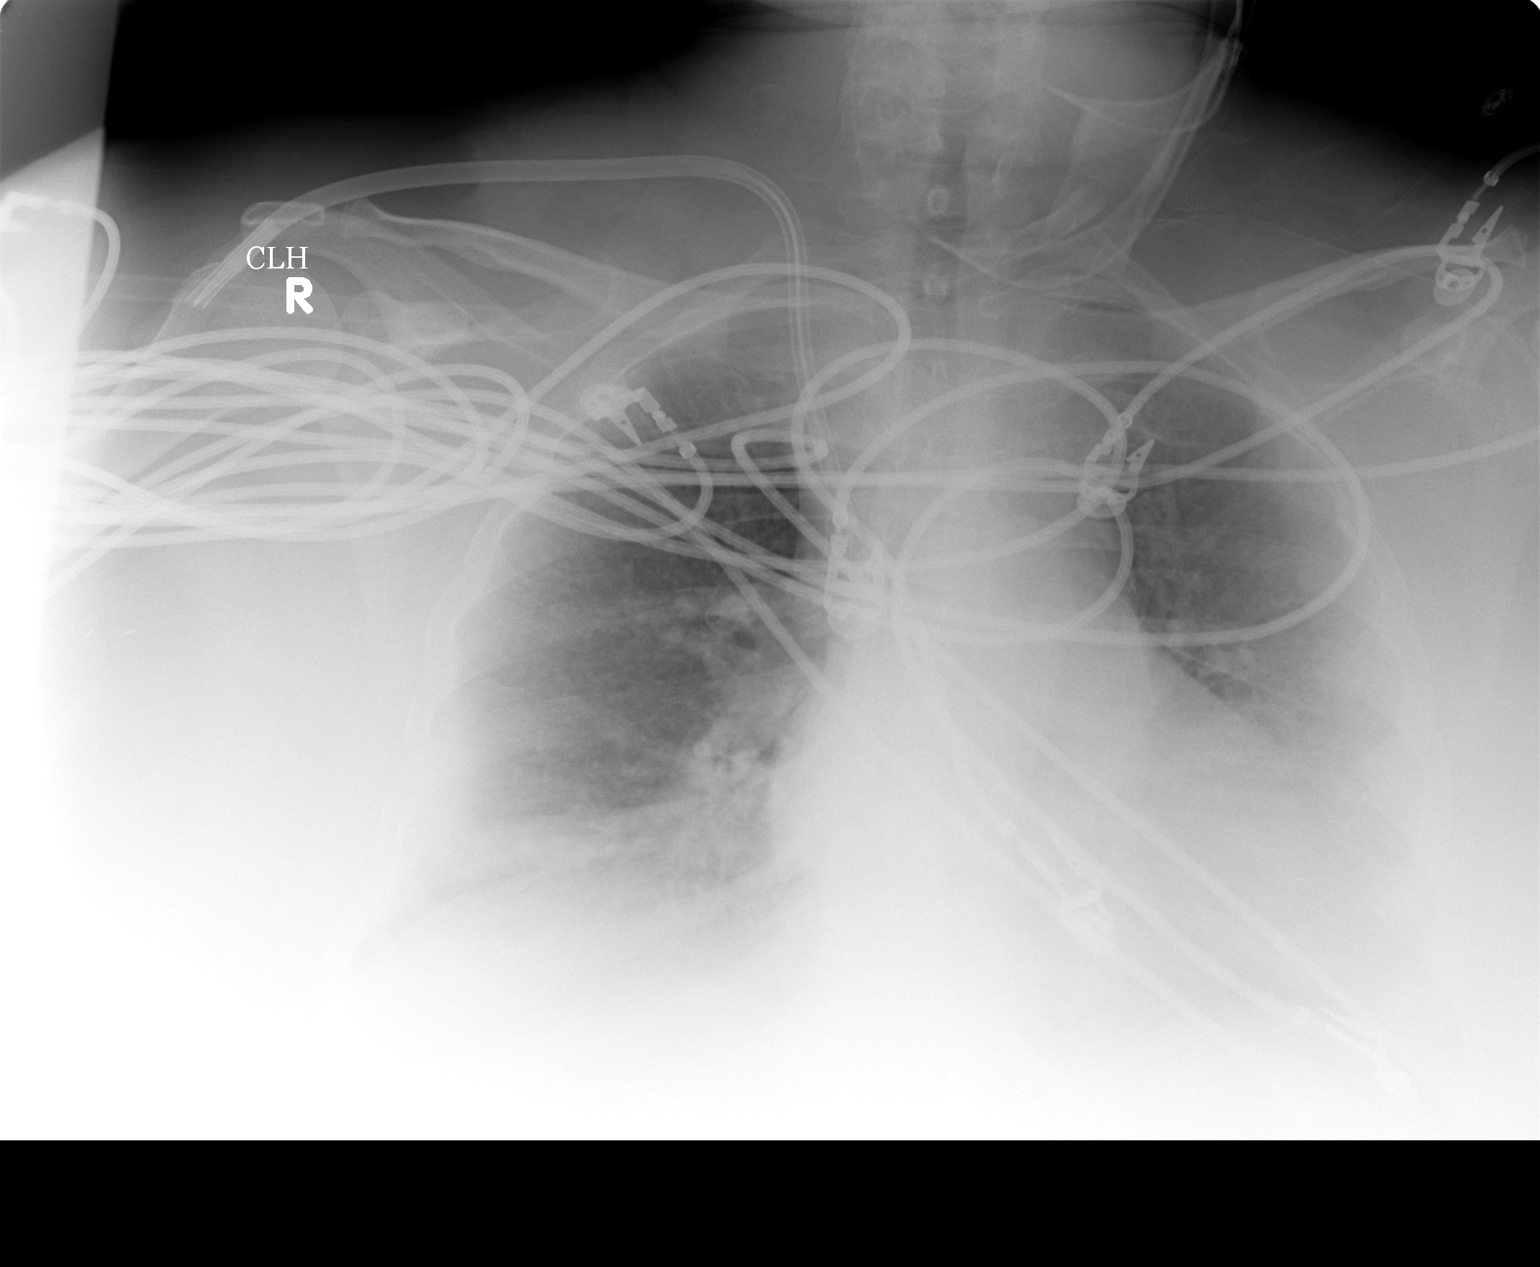

[1 of 1 positions shown; findings below may reference images not displayed]

## 2005-10-30 ENCOUNTER — Encounter (INDEPENDENT_AMBULATORY_CARE_PROVIDER_SITE_OTHER): Payer: Self-pay | Admitting: Cardiology

## 2005-10-30 IMAGING — CR DG CHEST 1V PORT
1 series · 1 of 1 positions shown · non-contrast
Comparison: none

CLINICAL DATA: Sepsis, central line placement. 
 PORTABLE CHEST (ONE VIEW)
 Portable exam 1515 hours compared to 06/17/2004.  Right jugular central venous catheter tip right atrium.  Left jugular central venous catheter, tip right subclavian vein.  No pneumothorax.  Cardiomegaly.  Bilateral infiltrates increased since previous study. 
 IMPRESSION
 Diffuse bilateral pulmonary infiltrates new since previous exam. 
 Stable appearance right internal jugular catheter tip right atrium. 
 Tip of left jugular central venous catheter is in the right subclavian vein.

[view not recorded]
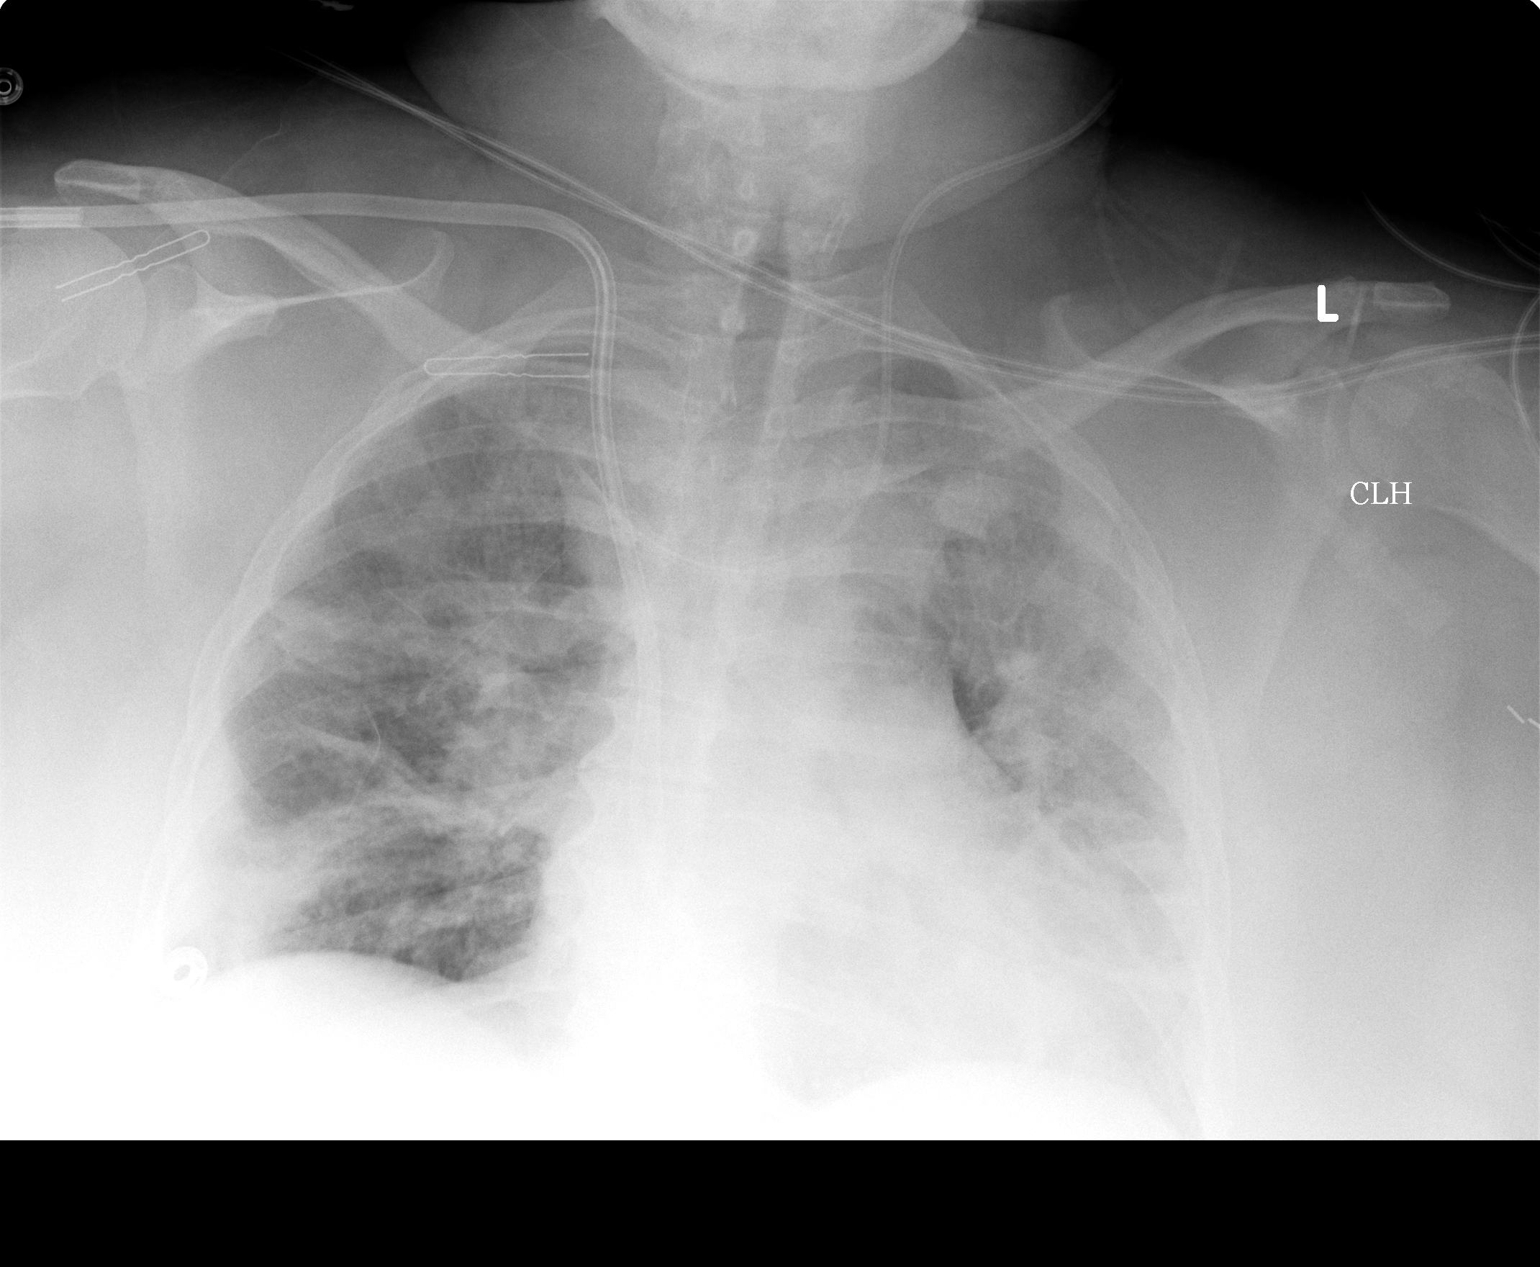

[1 of 1 positions shown; findings below may reference images not displayed]

## 2005-10-30 IMAGING — CR DG CHEST 1V PORT
1 series · 1 of 1 positions shown · non-contrast
Comparison: none

CLINICAL DATA: Renal failure.  Status post dialysis.  Sepsis. 
 PORTABLE CHEST 06/19/03, [DATE] HOURS
 Compared to the prior study today at 4846 hours, diffuse, patchy, bilateral airspace disease has not specifically changed.  Cardiomegaly is stable.  Bilateral central venous catheters remain in stable position.

[view not recorded]
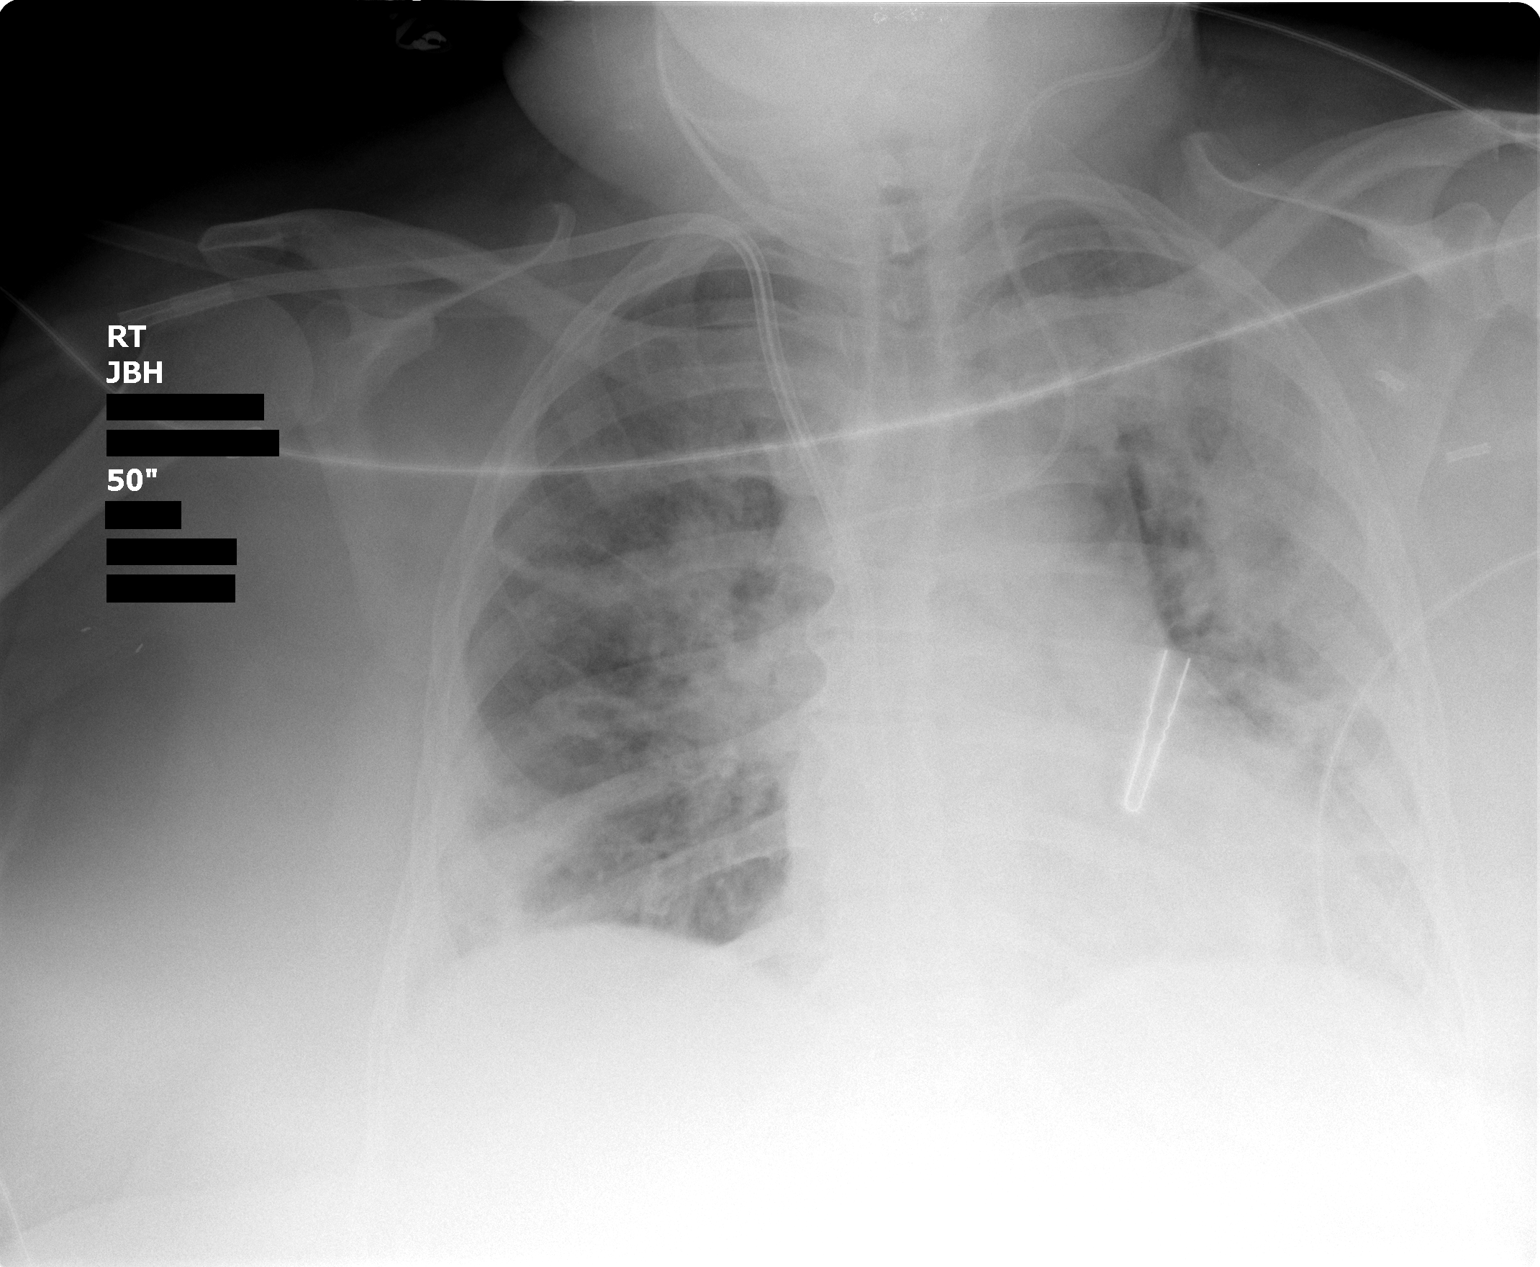

[1 of 1 positions shown; findings below may reference images not displayed]

IMPRESSION: Heterogeneous bilateral airspace opacity, without significant interval change.  Stable mild cardiomegaly.

## 2005-11-01 IMAGING — CT CT ANGIO CHEST
4 of 8 series · 16 of 32 positions shown · IV contrast (TEST INJECTION)
Comparison: none

CLINICAL DATA: Chest pain, dyspnea, sepsis. 
CT ANGIO CHEST WITH CONTRAST ? 06/20/04
FINDINGS
TECHNIQUE 
Multidetector helical CT scanning obtained through the chest following administration of 150 cc of intravenous Omnipaque 300 using pulmonary embolus protocol.   Sagittal and coronal reformatted images of the chest performed. The patient was premedicated with prednisone and Benadryl prior to the study secondary to patient?s IV dye allergy history.

[Series 5: r/o pe · axial · 0.70mm/px · z∈[-186,-21]mm · 5 of 220 slices shown (1 of 2)]
[im 44/220  lung]
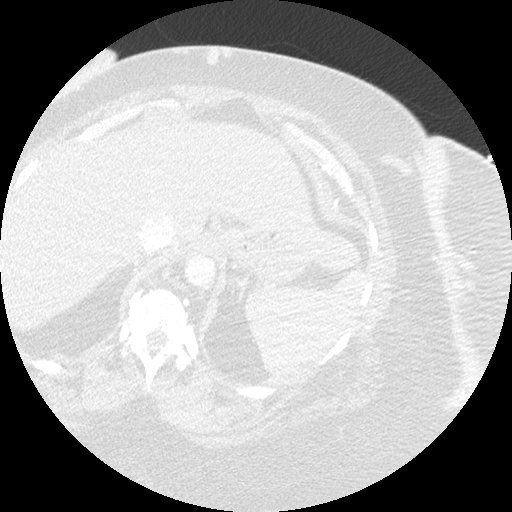
[im 88/220  lung]
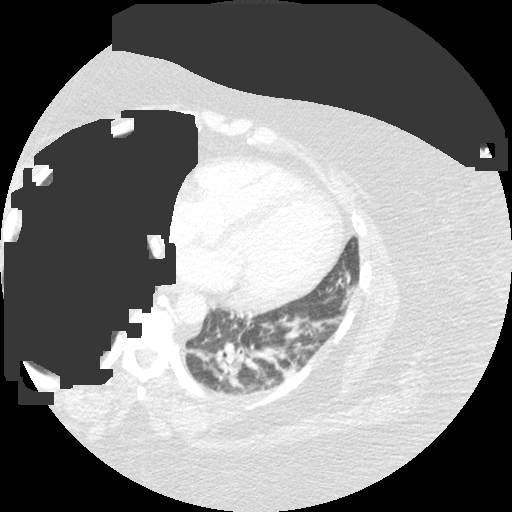
[im 128/220  lung]
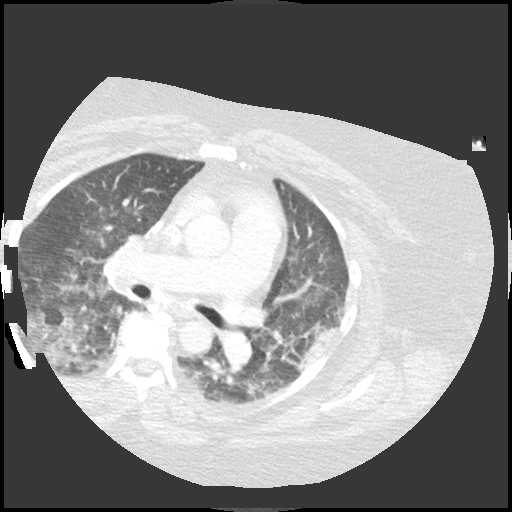
[im 132/220  lung]
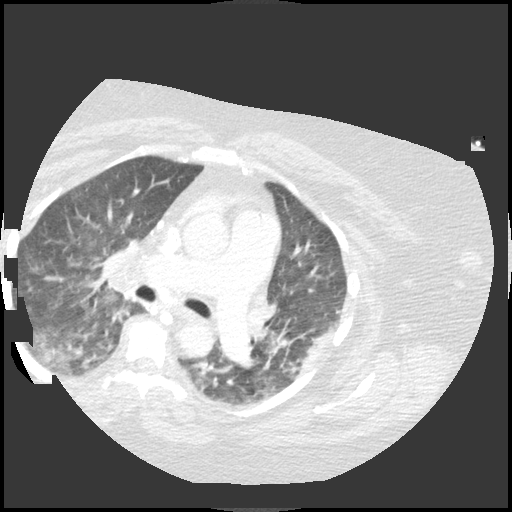
[im 176/220  lung]
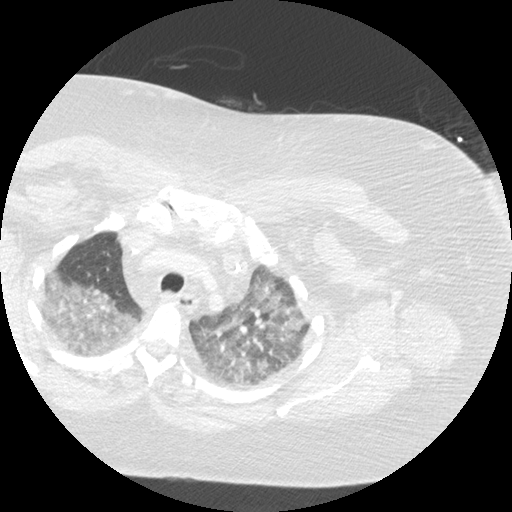

[Series 6: recon 2: r/o pe · axial · 0.70mm/px · z∈[-148,-58]mm · 3 of 110 slices shown]
[im 37/110  lung]
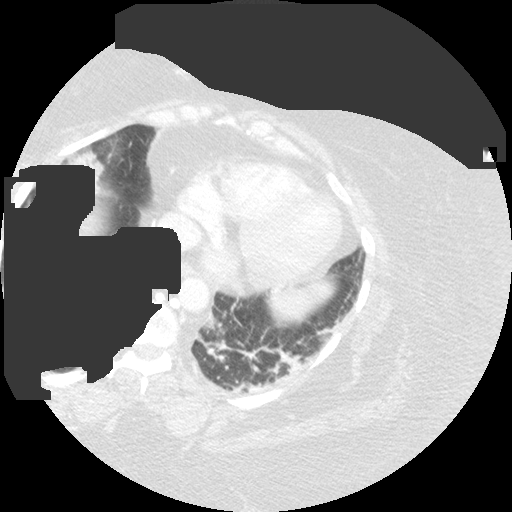
[im 64/110  lung]
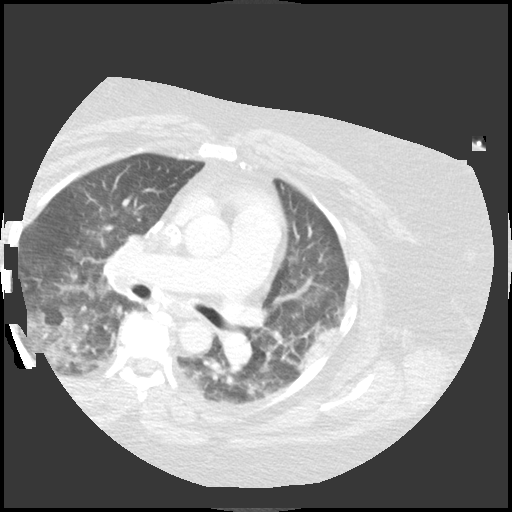
[im 73/110  lung]
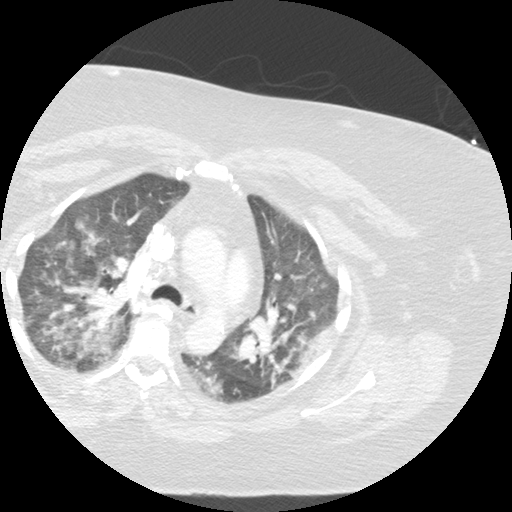

[Series 105: r/o pe · axial · 0.78mm/px · z∈[-194,-12]mm · 6 of 220 slices shown (2 of 2)]
[im 37/220  lung]
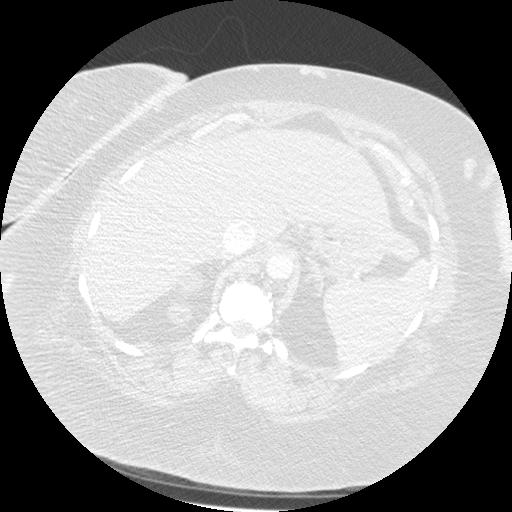
[im 74/220  mediastinal]
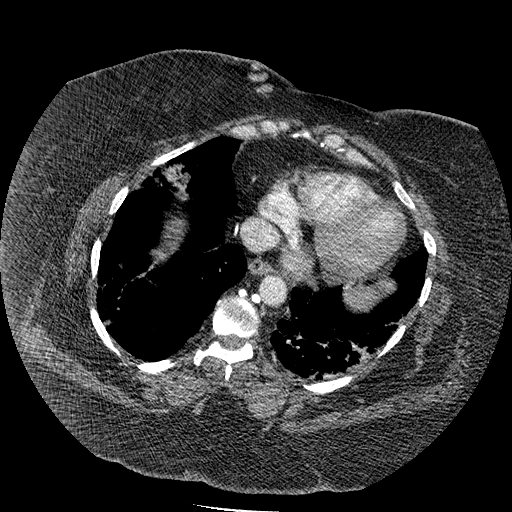
[im 110/220  lung]
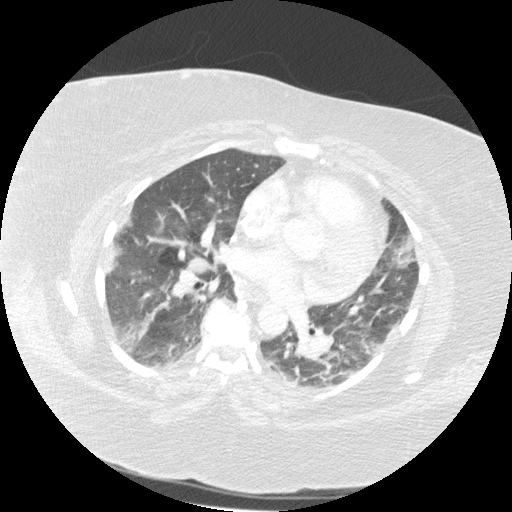
[im 128/220  mediastinal]
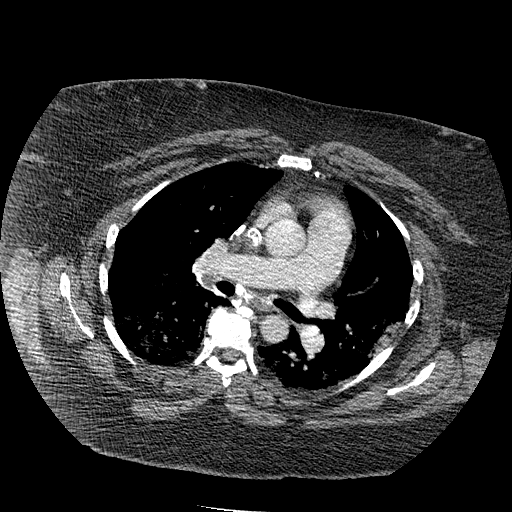
[im 147/220  lung]
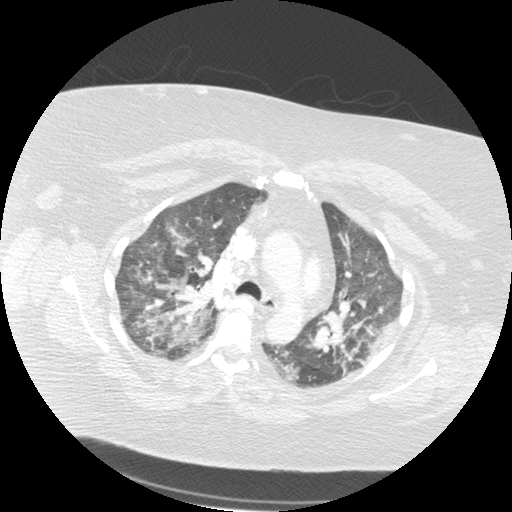
[im 183/220  mediastinal]
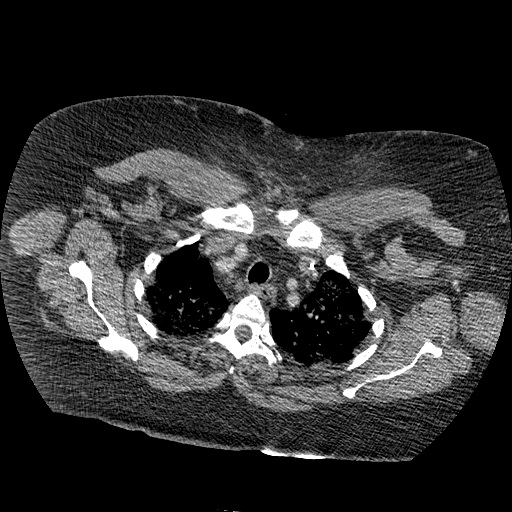

[Series 106: reformatted · sagittal · 0.78mm/px · 2 of 130 slices shown]
[im 44/130  lung]
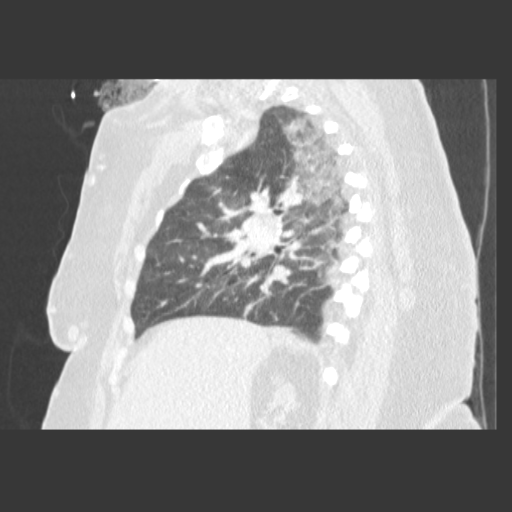
[im 87/130  lung]
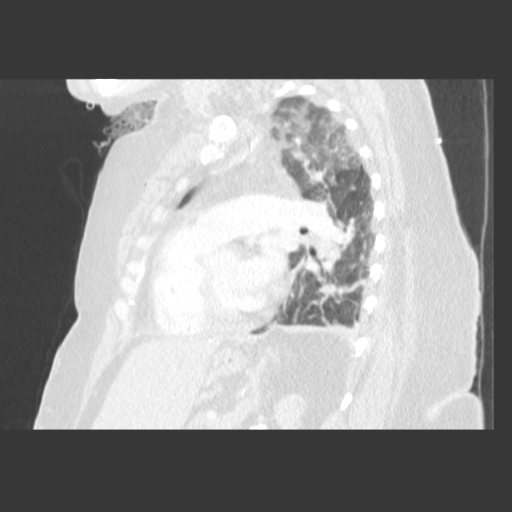

[16 of 32 positions shown; findings below may reference images not displayed]

FINDINGS: No definite filling defects within the central pulmonary arteries are noted to suggest pulmonary embolus.  Some segmental arteries are not well evaluated secondary to patient body habitus, decreased rate of contrast injection (injected through central venous catheter) and small motion artifact.  No evidence of pleural or pericardial effusions. Shotty mediastinal axillary lymph nodes noted without enlarged lymph nodes present. There is no evidence of aortic dissection of aneurysm.  Chest wall venous collaterals are present.  Scattered air space disease, ground-glass opacities within the lungs; greatest in the upper lung is noted.   Scattered areas of atelectasis/scarring are noted.  Peribronchial thickening is present.   Small atrophic kidneys are noted with probable renal cysts.  Reflux of intravenous contrast in the azygos system into the abdomen, left renal vein and IVC noted.  

IMPRESSION 
No evidence of large/central pulmonary embolus. Please note sensitivity is decreased for segmental arteries and smaller as described. 
Scattered diffuse air space and ground glass opacities.  
Scattered areas of scarring/atelectasis.    
Mild cardiomegaly, atrophic kidneys bilaterally with probable renal cysts.

## 2005-12-07 ENCOUNTER — Ambulatory Visit (HOSPITAL_COMMUNITY): Admission: RE | Admit: 2005-12-07 | Discharge: 2005-12-07 | Payer: Self-pay | Admitting: Nephrology

## 2006-01-09 ENCOUNTER — Ambulatory Visit (HOSPITAL_COMMUNITY): Admission: RE | Admit: 2006-01-09 | Discharge: 2006-01-09 | Payer: Self-pay | Admitting: Nephrology

## 2006-01-16 ENCOUNTER — Ambulatory Visit: Admission: RE | Admit: 2006-01-16 | Discharge: 2006-01-16 | Payer: Self-pay | Admitting: Nephrology

## 2006-02-03 IMAGING — XA IR ANGIO/A/V SHUNT*R*
1 series · 13 of 24 positions shown · IV contrast (omnipaque)
Comparison: none

CLINICAL DATA: History of end-stage renal disease.  Clotted right thigh dialysis graft.
 PROCEDURES ? 09/22/04
 1.  ULTRASOUND GUIDANCE FOR DIALYSIS GRAFT ACCESS X 2
 2.  DIALYSIS GRAFT DECLOT PROCEDURE INCLUDING TPA DWELL, BALLOON MACERATION AND FOGARTY THROMBECTOMY
 3.  VENOUS ANGIOPLASTY OF VENOUS ANASTOMOSIS OF GRAFT 
 4.  COMPLETION SHUNTOGRAM AFTER DECLOT
 Informed consent was obtained.
 Contrast:  140 cc Omnipaque 300.
 Fluoro Time:  6.8 minutes.
 Initial palpation was performed of the right thigh Gortex graft.  The loop graft was sterilely prepped and draped.  Local anesthesia was provided with 1% lidocaine.  
 Under direct ultrasound guidance, 18 gauge Angiocath were introduced into both arterial and venous lumens of the graft directed towards the apex.  After confirming intraluminal position, 1 mg of TPA in 1 cc volume was injected via each Angiocath.  
 These catheters were then removed over guidewires and 6 French vascular sheaths placed at both access sites.  A diagnostic catheter was advanced out to the level of the patent right common femoral vein.  The patient received 5222 units of heparin as well as 50 mg of IV Benadryl for known allergy to pork heparin.  Contrast outflow venogram was then performed of the femoral vein, iliac veins, and entire IVC.
 The catheter was then brought back to the venous anastomosis and contrast injection performed.
 Venous angioplasty was then performed utilizing a 7 mm x 4 cm Conquest balloon at the level of the anastomosis.  After sustained inflation the balloon was deflated.  The balloon was then inflated serially along the venous limb of the graft and aspiration performed through the sheath.
 Fogarty thrombectomy was performed with a 4 French Fogarty catheter advanced through the arterial anastomosis to the level of patent artery.  The balloon was inflated and brought across the arterial plug and also used to performed thrombectomy in the arterial limb to the level of the sheath where aspiration was performed.  Additional shuntogram evaluation was performed via catheter positioned just beyond the arterial anastomosis. 
 Balloon maceration was performed along the venous limb.  Additional Fogarty thrombectomy was also performed near the apex of the graft.  A second balloon dilatation of the venous anastomosis and distal graft was also performed with the 7 mm Conquest balloon.  
 Completion shuntogram was performed.  Both sheaths were removed and 0 silk purse string sutures applied.
 Complications:  None.

[Series 1000: run · 0.10mm/px · 13 of 37 slices shown]
[im 1/37]
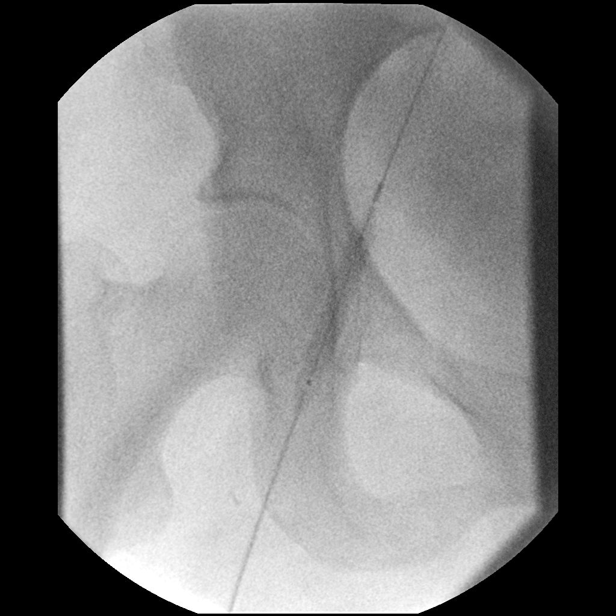
[im 4/37]
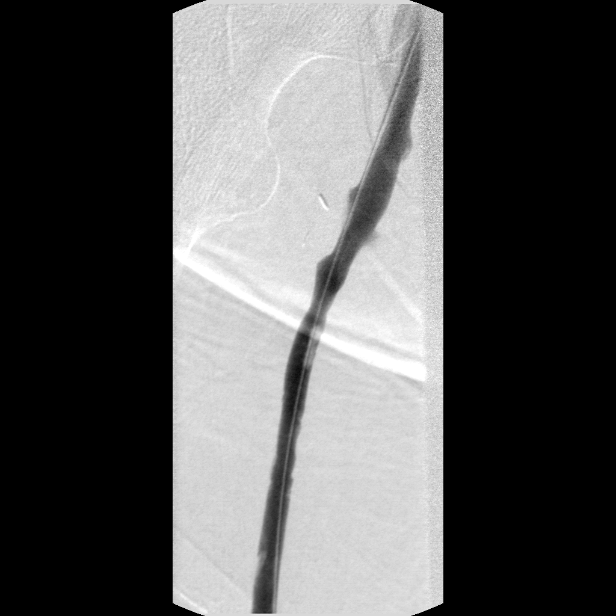
[im 7/37]
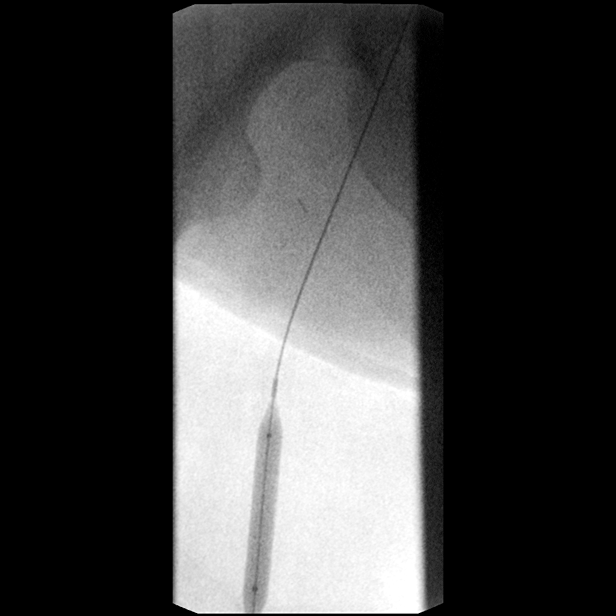
[im 10/37]
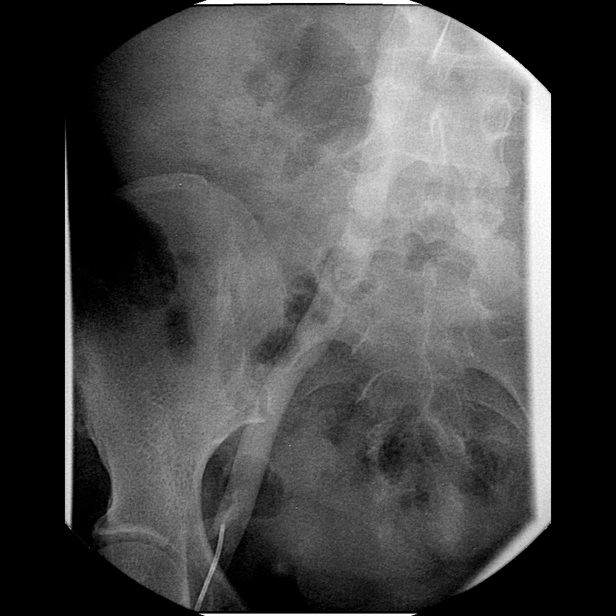
[im 13/37]
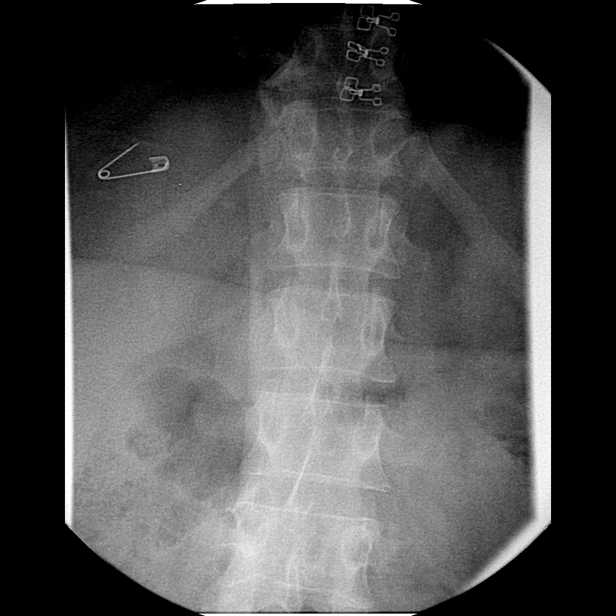
[im 16/37]
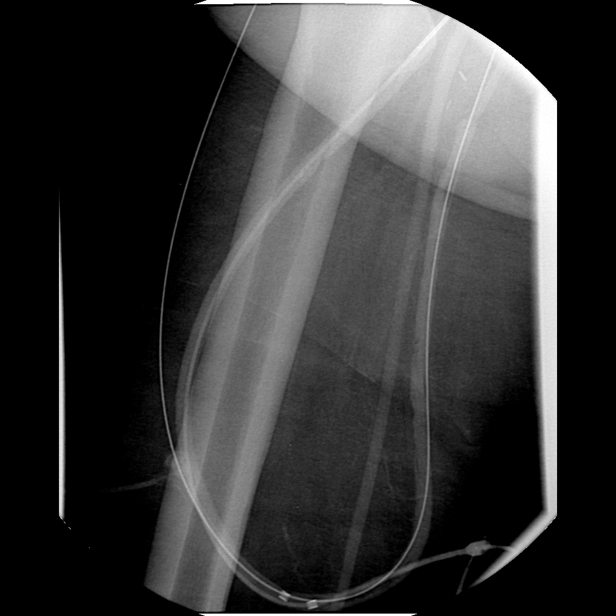
[im 19/37]
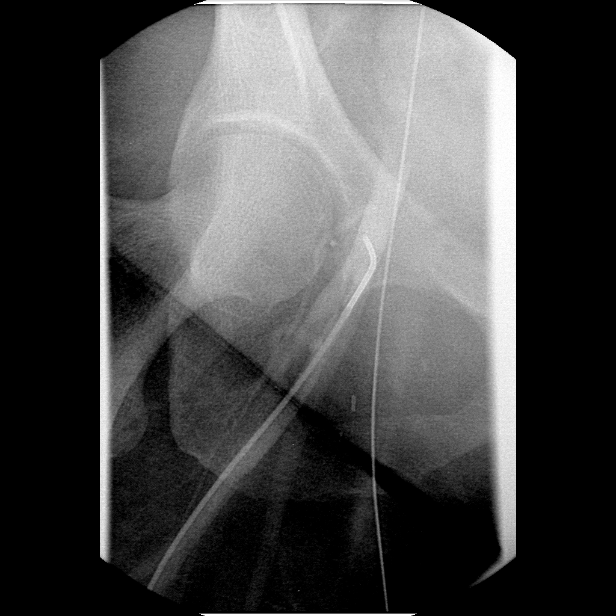
[im 21/37]
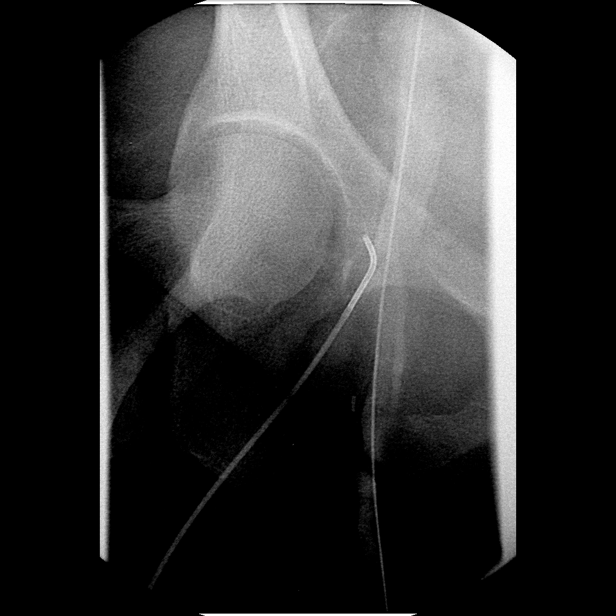
[im 24/37]
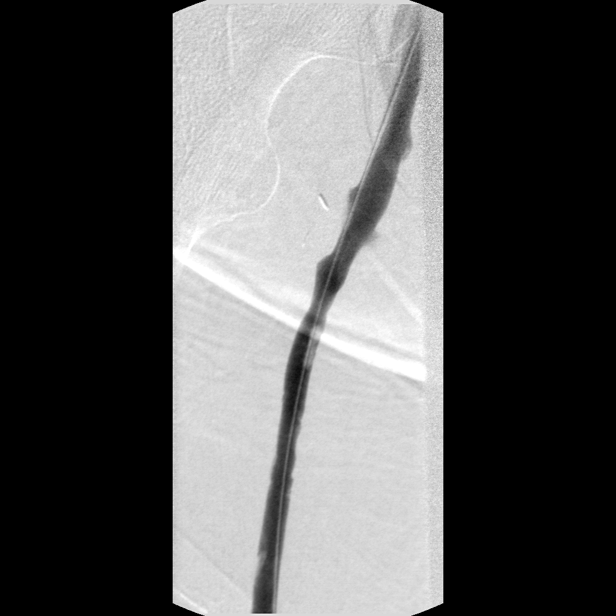
[im 27/37]
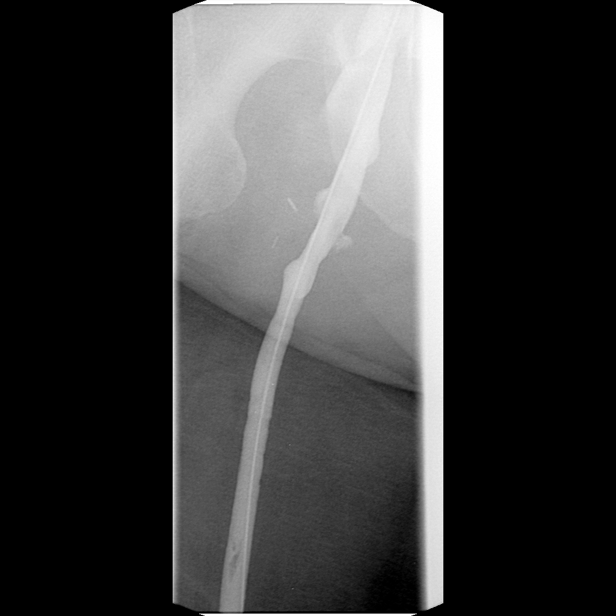
[im 30/37]
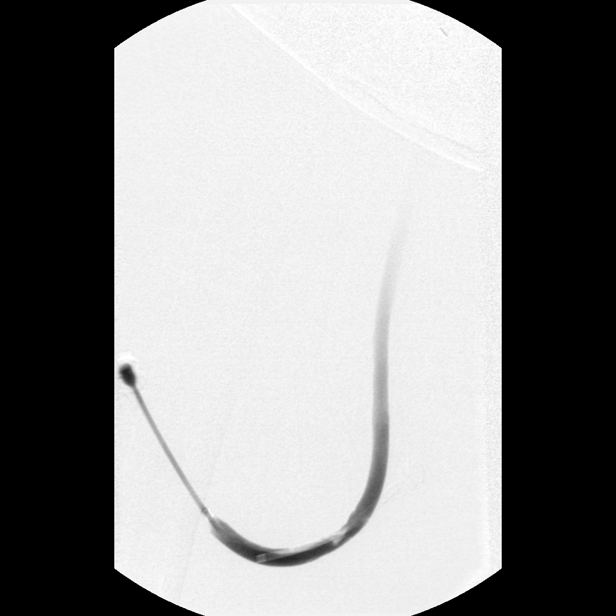
[im 33/37]
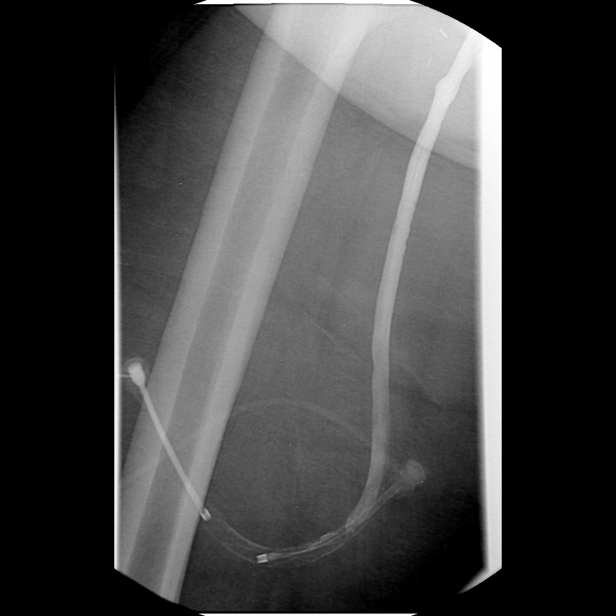
[im 37/37]
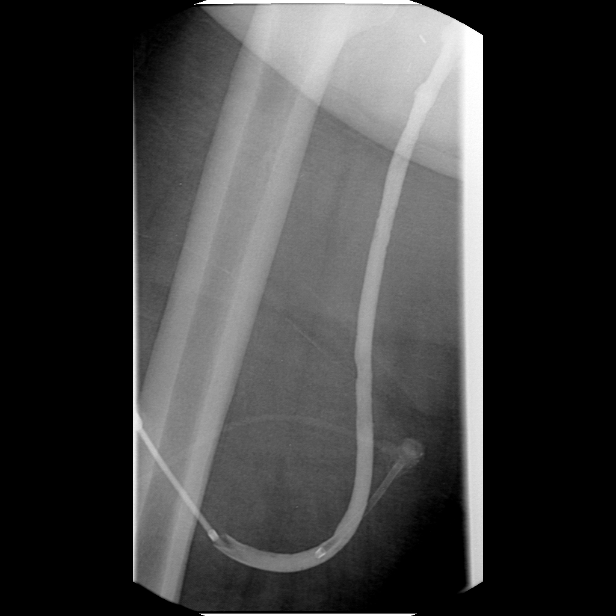

[13 of 24 positions shown; findings below may reference images not displayed]

FINDINGS: Initial palpation confirms thrombosis of the right thigh graft.  Contrast injection shows a tight stenosis at the venous anastomosis as the cause of thrombosis.  After establishing graft patency and performing additional thrombectomy and balloon maceration the graft is now widely patent.  After two separate sustained dilatations across the venous anastomotic stenosis, there now is a widely patent anastomosis present with no evidence of residual stenosis.
IMPRESSION: Successful declot procedure of thrombosed right thigh graft as above.  Underlying etiology was secondary to a tight stenosis at the venous anastomosis which responded very well to 7 mm balloon angioplasty with no further residual stenosis remaining.

## 2006-02-03 IMAGING — US IR US GUIDE VASC ACCESS RIGHT
1 series · 2 of 2 positions shown · IV contrast (omnipaque)
Comparison: none

CLINICAL DATA: History of end-stage renal disease.  Clotted right thigh dialysis graft.
 PROCEDURES ? 09/22/04
 1.  ULTRASOUND GUIDANCE FOR DIALYSIS GRAFT ACCESS X 2
 2.  DIALYSIS GRAFT DECLOT PROCEDURE INCLUDING TPA DWELL, BALLOON MACERATION AND FOGARTY THROMBECTOMY
 3.  VENOUS ANGIOPLASTY OF VENOUS ANASTOMOSIS OF GRAFT 
 4.  COMPLETION SHUNTOGRAM AFTER DECLOT
 Informed consent was obtained.
 Contrast:  140 cc Omnipaque 300.
 Fluoro Time:  6.8 minutes.
 Initial palpation was performed of the right thigh Gortex graft.  The loop graft was sterilely prepped and draped.  Local anesthesia was provided with 1% lidocaine.  
 Under direct ultrasound guidance, 18 gauge Angiocath were introduced into both arterial and venous lumens of the graft directed towards the apex.  After confirming intraluminal position, 1 mg of TPA in 1 cc volume was injected via each Angiocath.  
 These catheters were then removed over guidewires and 6 French vascular sheaths placed at both access sites.  A diagnostic catheter was advanced out to the level of the patent right common femoral vein.  The patient received 5222 units of heparin as well as 50 mg of IV Benadryl for known allergy to pork heparin.  Contrast outflow venogram was then performed of the femoral vein, iliac veins, and entire IVC.
 The catheter was then brought back to the venous anastomosis and contrast injection performed.
 Venous angioplasty was then performed utilizing a 7 mm x 4 cm Conquest balloon at the level of the anastomosis.  After sustained inflation the balloon was deflated.  The balloon was then inflated serially along the venous limb of the graft and aspiration performed through the sheath.
 Fogarty thrombectomy was performed with a 4 French Fogarty catheter advanced through the arterial anastomosis to the level of patent artery.  The balloon was inflated and brought across the arterial plug and also used to performed thrombectomy in the arterial limb to the level of the sheath where aspiration was performed.  Additional shuntogram evaluation was performed via catheter positioned just beyond the arterial anastomosis. 
 Balloon maceration was performed along the venous limb.  Additional Fogarty thrombectomy was also performed near the apex of the graft.  A second balloon dilatation of the venous anastomosis and distal graft was also performed with the 7 mm Conquest balloon.  
 Completion shuntogram was performed.  Both sheaths were removed and 0 silk purse string sutures applied.
 Complications:  None.

[Series 1: sp us guide vasc access*right* · 2 of 2 slices shown]
[im 1/2]
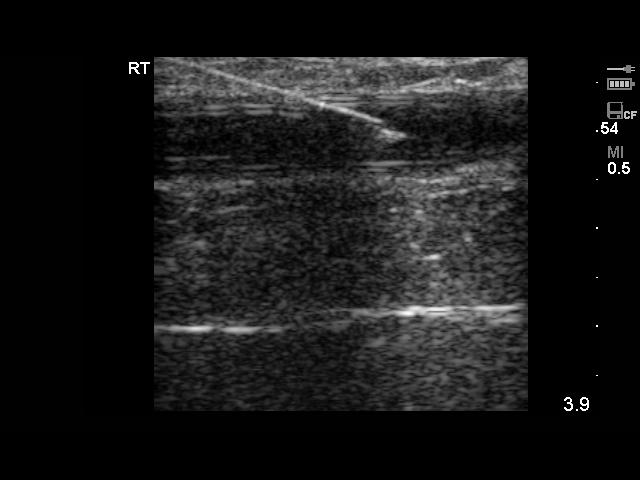
[im 2/2]
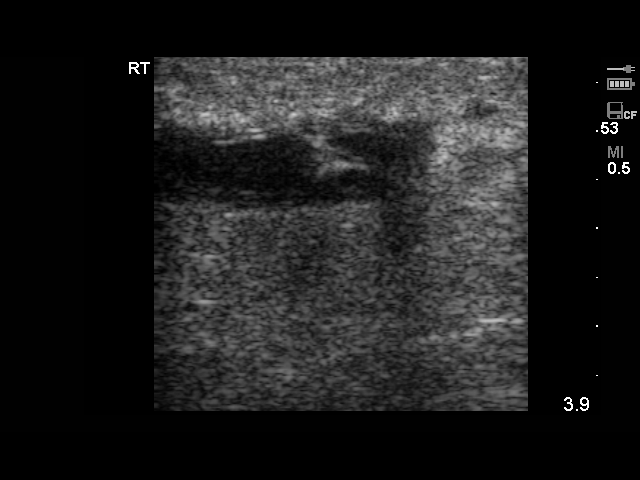

[2 of 2 positions shown; findings below may reference images not displayed]

FINDINGS: Initial palpation confirms thrombosis of the right thigh graft.  Contrast injection shows a tight stenosis at the venous anastomosis as the cause of thrombosis.  After establishing graft patency and performing additional thrombectomy and balloon maceration the graft is now widely patent.  After two separate sustained dilatations across the venous anastomotic stenosis, there now is a widely patent anastomosis present with no evidence of residual stenosis.
IMPRESSION: Successful declot procedure of thrombosed right thigh graft as above.  Underlying etiology was secondary to a tight stenosis at the venous anastomosis which responded very well to 7 mm balloon angioplasty with no further residual stenosis remaining.

## 2006-02-10 IMAGING — XA DG AV DIALYSIS GRAFT DECLOT OR
1 series · 12 of 24 positions shown · non-contrast
Comparison: none

CLINICAL DATA: Recurrent thrombosis of right thigh dialysis graft.  This was declotted on 09/22/04 as well as 09/28/04.  She is now in need of emergent dialysis due to fluid overload.  
 PROCEDURES ? 09/29/04
 1.  DIALYSIS GRAFT DECLOT PROCEDURE
 2.  VENOUS ANGIOPLASTY
 3.  COMPLETION SHUNTOGRAM
 Informed consent was obtained.  The entire right thigh graft was sterilely prepped and draped.  An 18 gauge Angiocath was introduced in antegrade direction in both limbs and directed towards the apex.  1 mg of TPA was then injected via both Angiocaths for a total dose of 2 mg.  The Angiocaths were exchanged for 6 French sheaths over guidewires.  A diagnostic catheter was advanced into patent femoral venous outflow.  The patient received 50 mg IV Versed, 7000 units IV heparin, 0.5 mg IV Versed, and 50 mcg IV fentanyl.  Outflow venogram was performed.  7 mm balloon angioplasty was performed across the venous anastomosis and into the venous limb of the dialysis graft.  Fogarty balloon thrombectomy was then performed twice across the arterial anastomosis and through the entire arterial limb.  Additional balloon maceration was performed at one portion of the venous limb of the graft.  Completion shuntogram was performed through a diagnostic catheter positioned at the arterial anastomosis.  
 After the procedure the sheaths were removed and 0 silk purse string sutures applied.

[Series 1: run · 12 of 26 slices shown]
[im 2/26]
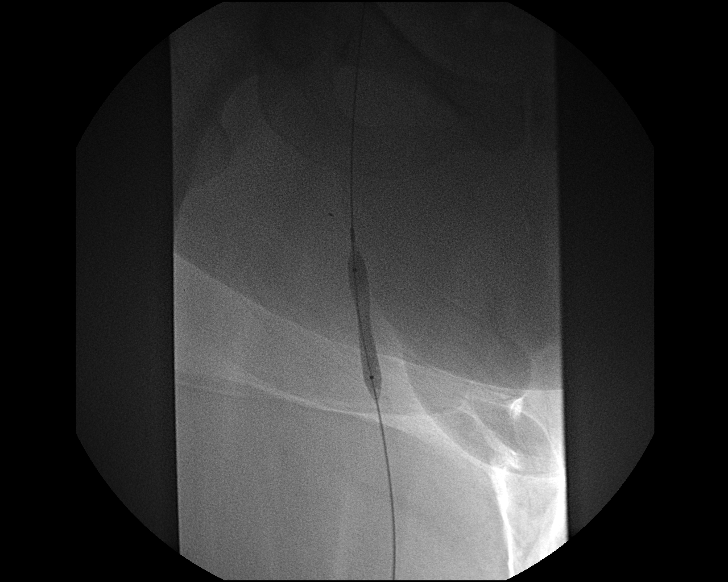
[im 4/26]
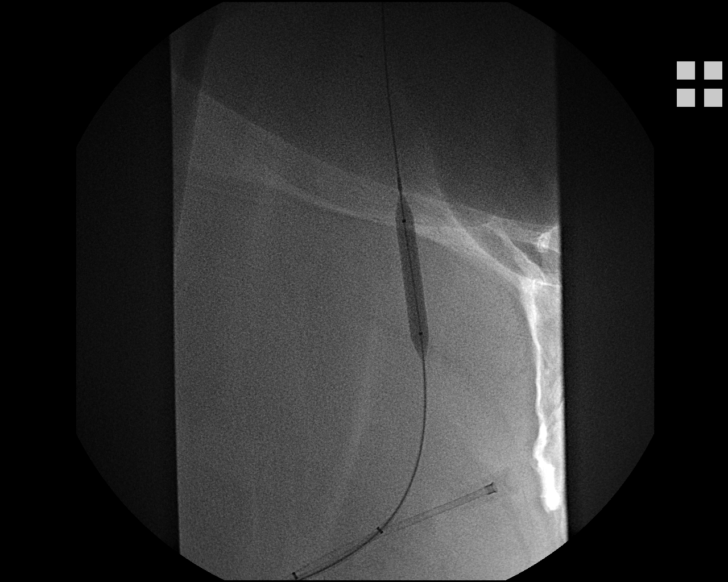
[im 6/26]
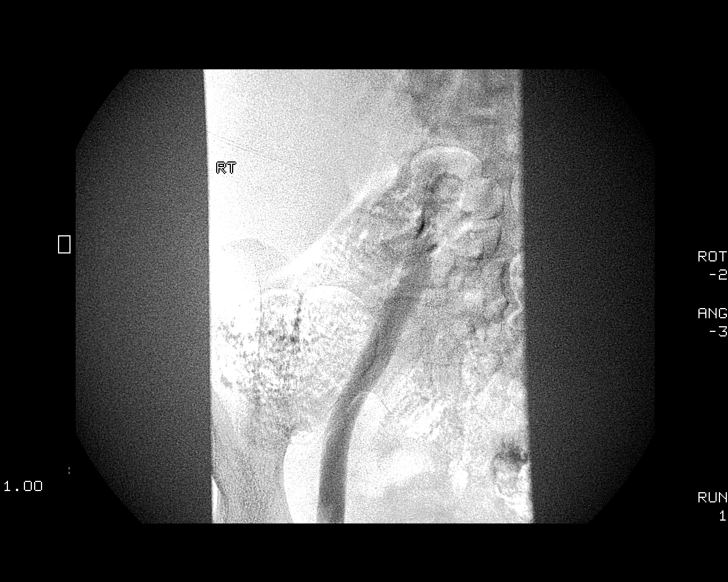
[im 8/26]
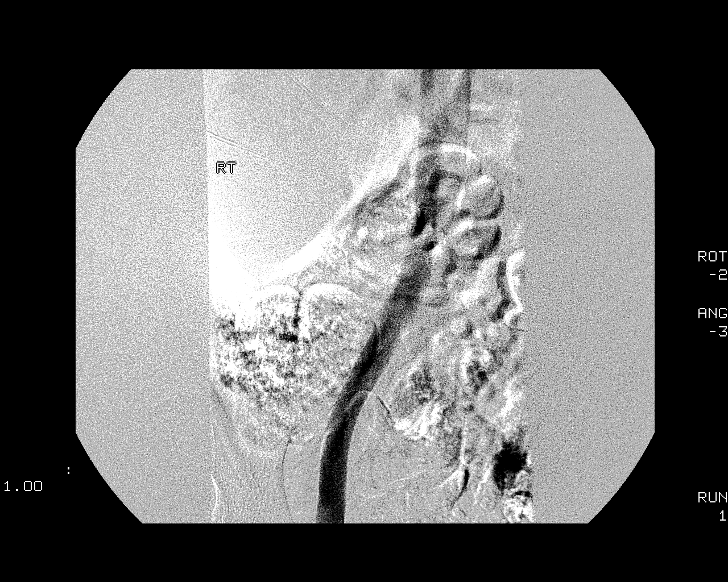
[im 10/26]
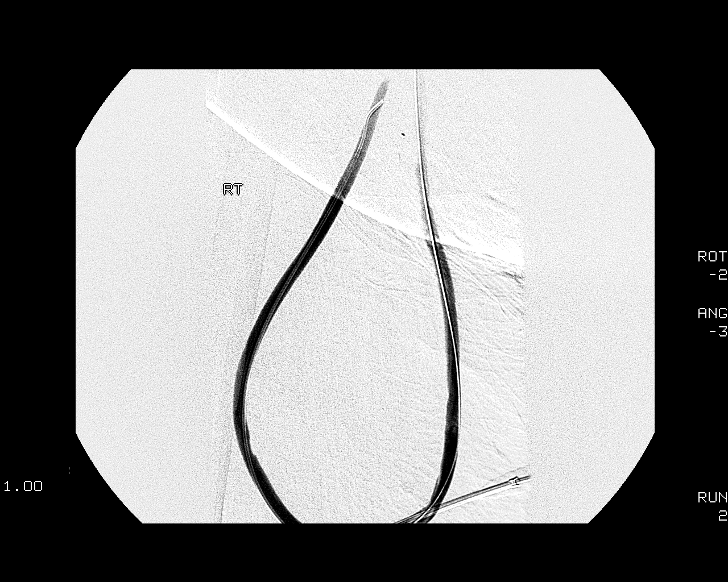
[im 12/26]
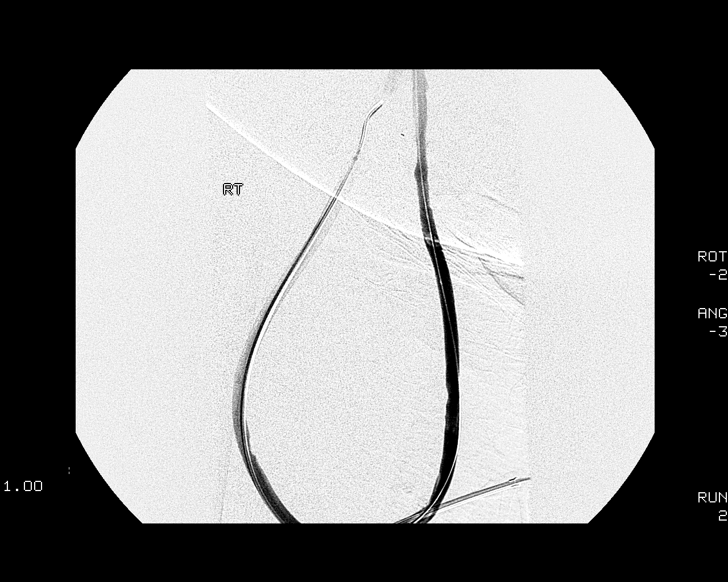
[im 15/26]
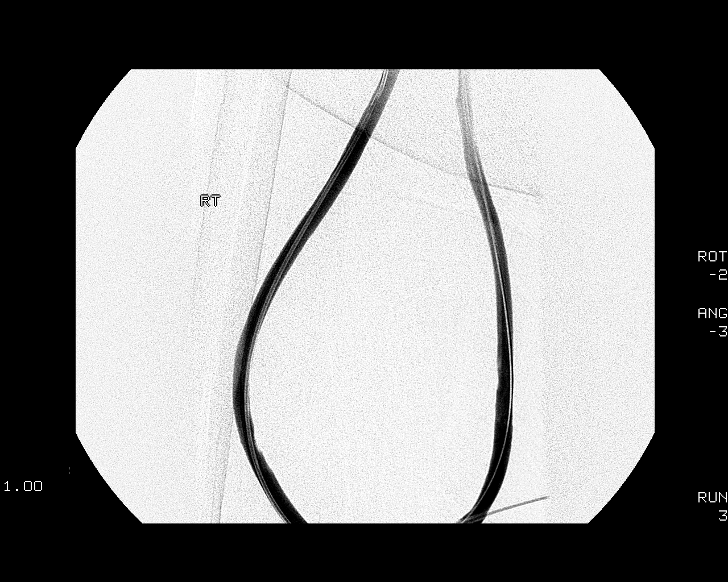
[im 17/26]
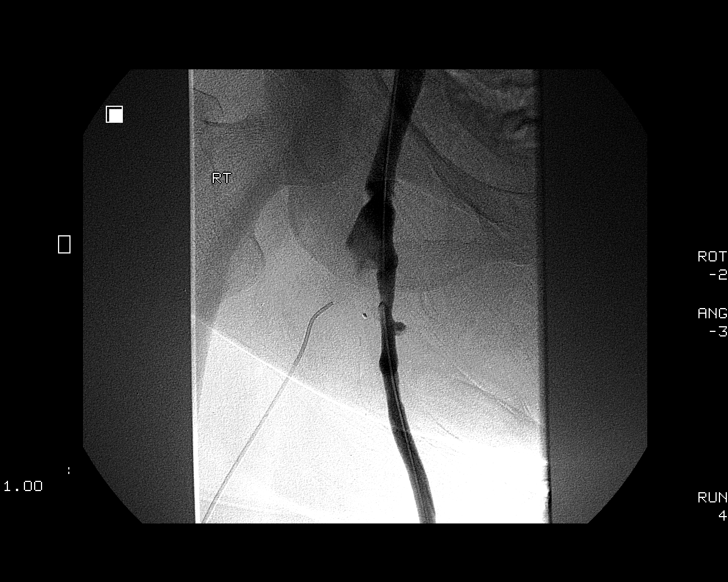
[im 19/26]
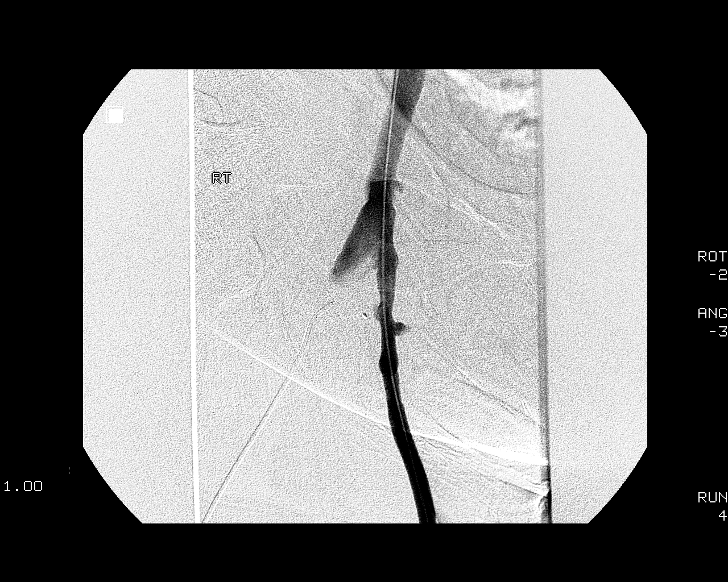
[im 21/26]
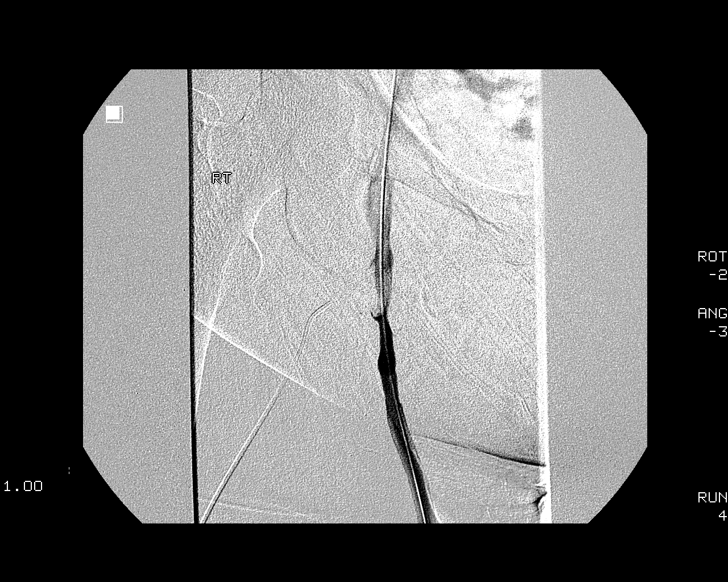
[im 23/26]
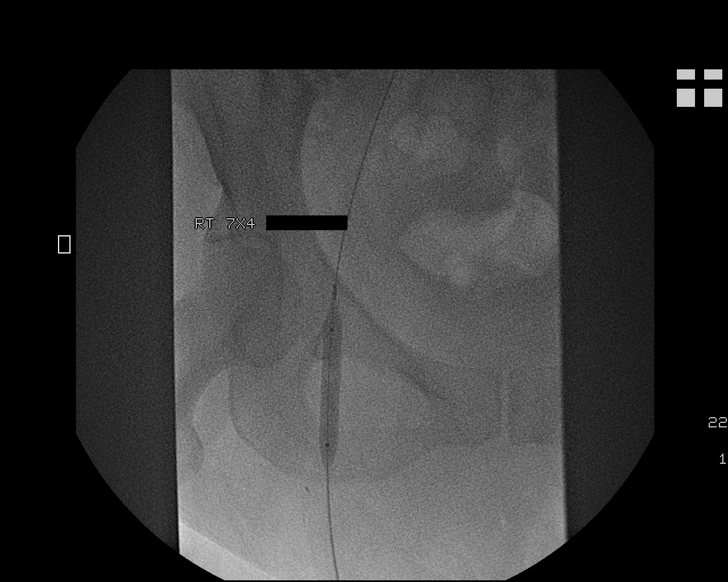
[im 26/26]
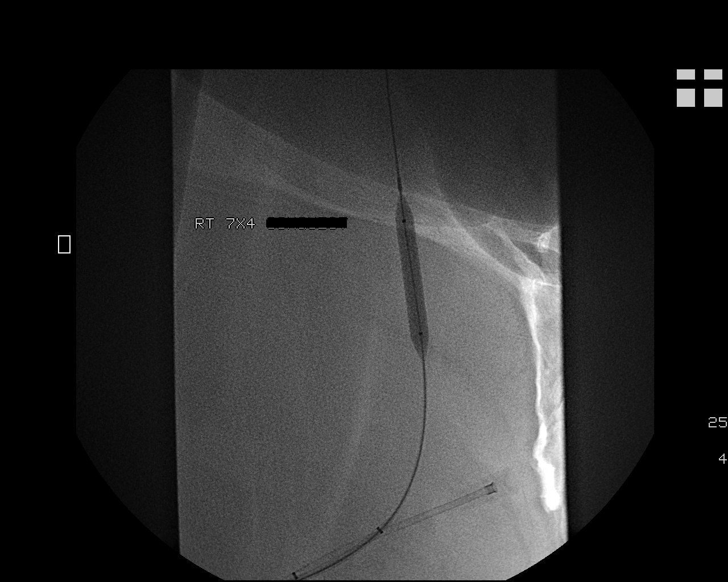

[12 of 24 positions shown; findings below may reference images not displayed]

FINDINGS: The graft shows recurrent thrombosis in less than 24 hours after the most recent declot procedure.  Acute thrombus was very rapidly eliminated and there did not appear to be a significant stenosis at the venous anastomosis at this time.  The arterial anastomosis is widely patent after thrombectomy and the graft now shows normal patency without areas of stenosis.  Venous outflow also remains widely patent.  Appearance of graft and acute rethrombosis does suggest that recurrent thrombosis is likely due to a combination of poor inflow and known low blood pressure.
IMPRESSION: Dialysis graft declot procedure as detailed above.  The angioplasty balloon did not show a substantial waist of venous anastomosis and the graft was easily cleared of acute thrombus.  Based on thrombosis in less than 24 hours, it is felt likely that the cause of thrombosis is due to poor arterial inflow and hypotension rather than any structural flow limitation at the level of the graft or venous outflow.

## 2006-02-12 IMAGING — CR DG CHEST 1V PORT
1 series · 1 of 1 positions shown · non-contrast
Comparison: none

CLINICAL DATA: Clotted graft.  Diatek insertion.
 PORTABLE CHEST -  10/01/04 AT 2609 HOURS
 Compared to a portable chest x-ray from yesterday, a Diatek catheter has been inserted from the left.  The tip is in the region of the left innominate vein.  No pneumothorax is seen.  There is cardiomegaly with mild congestion.

[view not recorded]
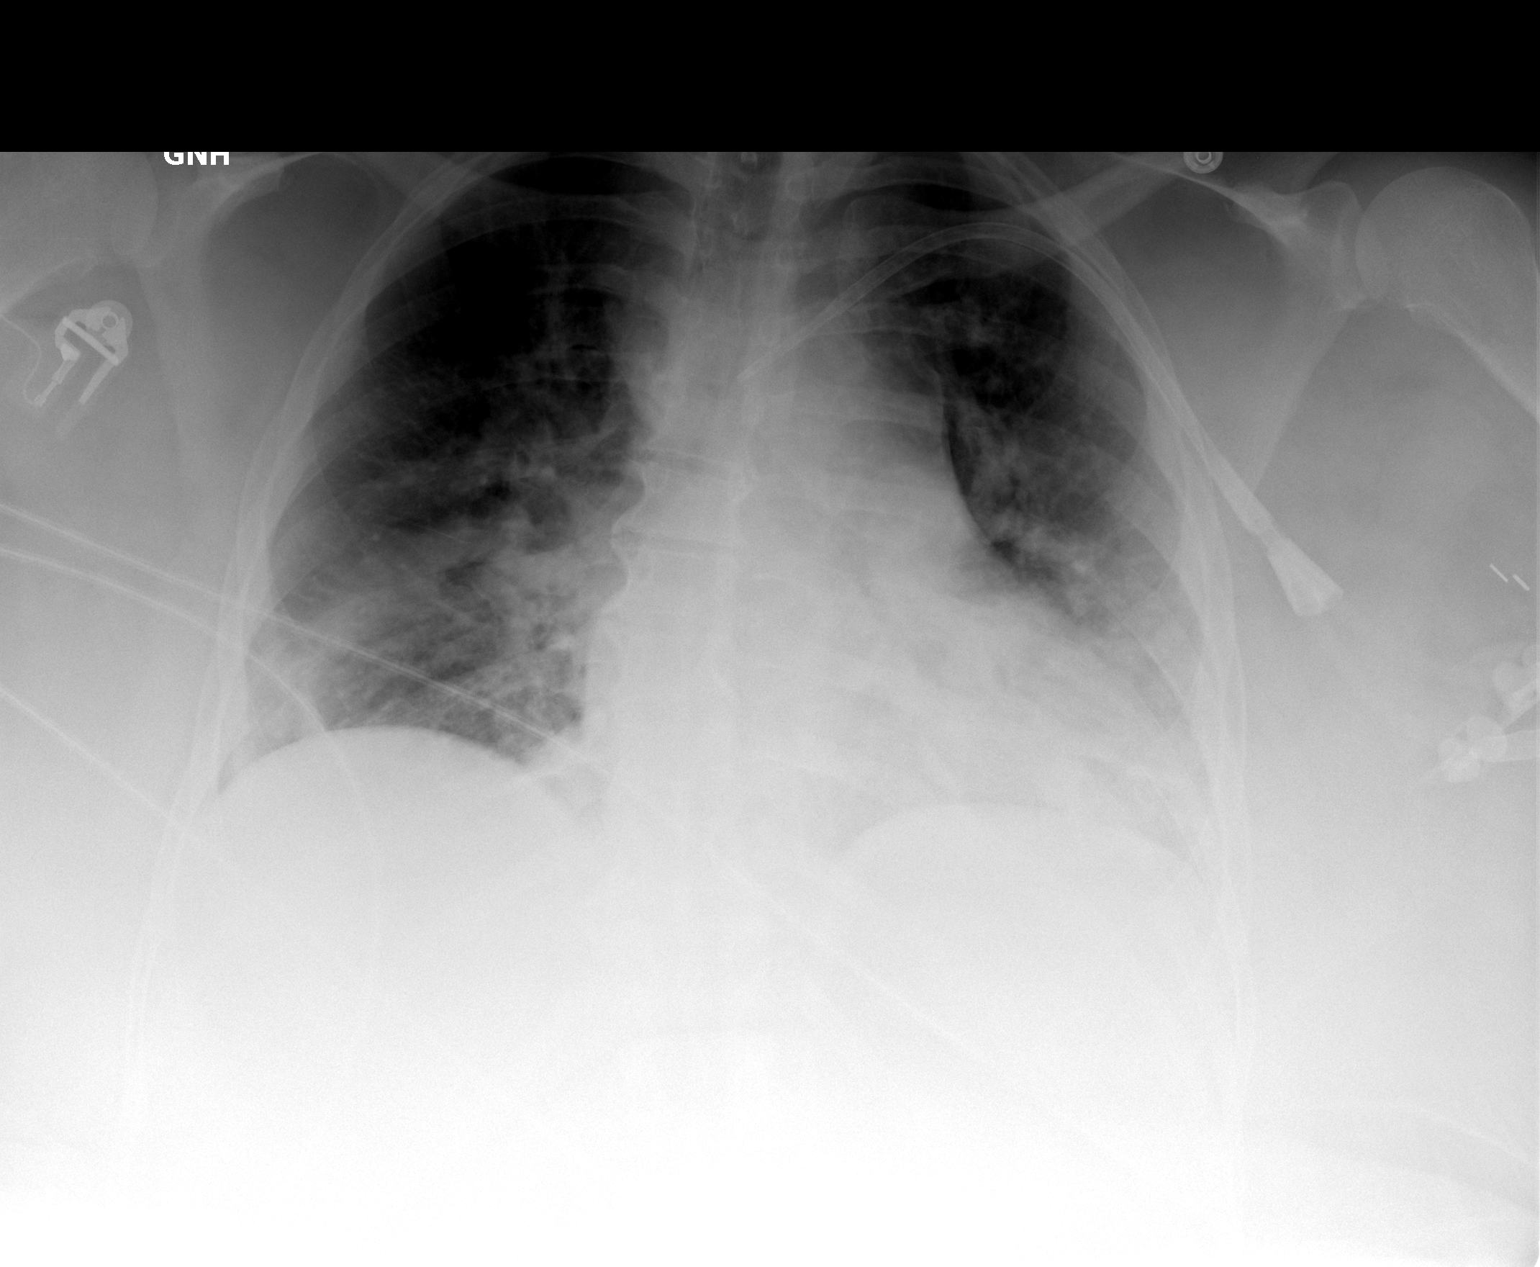

[1 of 1 positions shown; findings below may reference images not displayed]

IMPRESSION: 1.  Diatek catheter tip in left innominate vein.  No pneumothorax.
 2.  No change in cardiomegaly and congestion.

## 2006-02-14 IMAGING — CR DG CHEST 1V PORT
1 series · 1 of 1 positions shown · non-contrast
Comparison: 06/18/2004.

CLINICAL DATA: Status post Diatek catheter placement.

PORTABLE CHEST - 1 VIEW

[view not recorded]
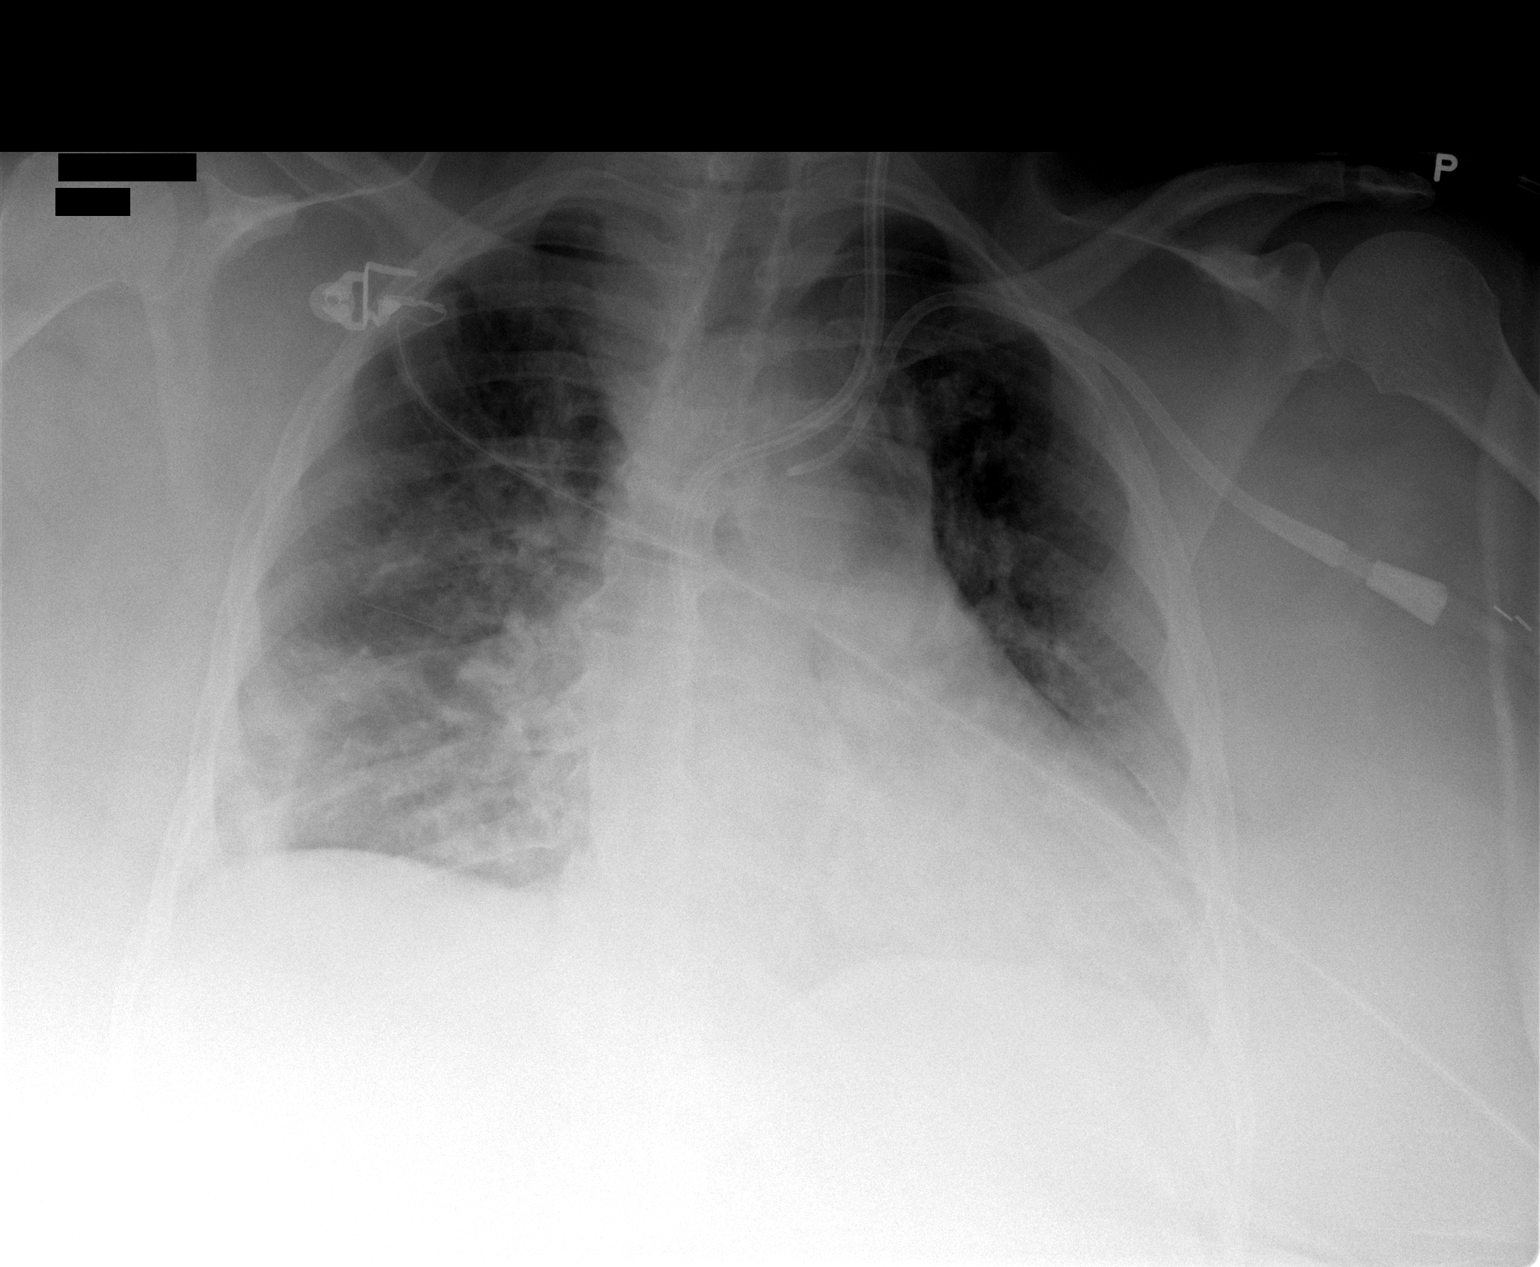

[1 of 1 positions shown; findings below may reference images not displayed]

FINDINGS: Left jugular Diatek catheter lumen tips in the superior vena cava. Left subclavian
catheter tip in the left innominate vein. No pneumothorax. Stable enlarged cardiac silhouette.
Diffusely prominent pulmonary vasculature and interstitial markings with resolved bilateral
airspace opacity. Stable mild opacity at the right lung base laterally. Stable thoracic spine
degenerative changes.  

IMPRESSION

1. Left jugular Diatek catheter lumen tips in the superior vena cava. 

2. Left subclavian catheter tip in the left innominate vein.

3. Stable cardiomegaly, pulmonary vascular congestion, chronic interstitial lung disease and
scarring at the right lateral lung base.

## 2006-02-15 IMAGING — XA IR [PERSON_NAME]/EXT/BI
1 series · 12 of 24 positions shown · non-contrast
Comparison: none

CLINICAL DATA: End stage renal disease, access planning.  Clotted AV graft. 
ULTRASOUND GUIDED BILATERAL UPPER EXTREMITY VENOGRAPHY 
Radiologist:  Vivianne Nacarate, M.D.
Guidance:  Ultrasound and fluoroscopic.
Complications:  No immediate complications.
Procedure/Findings:
Initially, the right brachial vein was visualized with ultrasound. Under sterile conditions and local anesthesia, micropuncture access was performed. Imaging was obtained for documentation.  An 018 guidewire was advanced centrally followed by a stiff microdilator set.  The catheter was secured to the skin with [place].  
  In a similar fashion, the left brachial vein was identified and patent. Under sterile conditions and local anesthesia, the left brachial vein was accessed with a micropuncture needle. Images were obtained for documentation.  An 018 guidewire was advanced centrally followed by a stiff microdilator set.  
Contrast was injected for bilateral upper extremity and central venography.  The catheters were removed.  Hemostasis was obtained with compression. 
RIGHT UPPER EXTREMITY
The right brachial vein, axillary vein, and subclavian vein are all patent. The right innominate vein is patent.  At the level of the azygos vein there appears to be a moderately severe nearly occlusive stenosis of the SVC through which a double lumen catheter passes.  The azygos vein is enlarged and does opacify.  The majority of the venous outflow centrally is through the azygos system.
LEFT UPPER EXTREMITY 
The left brachial venous system is paired.  The left brachial, axillary, and subclavian veins are patent. The subclavian vein at the junction of the internal jugular vein has two catheters passing through it.  One is via the left internal jugular vein and the second catheter through the left subclavian vein.  Through this region there is significant narrowing related to the two catheters and there is surrounding nonocclusive thrombus where the catheters pass through the subclavian internal jugular venous junction.  The innominate vein is mildly narrowed distally.  More centrally, the innominate vein is patent to the level of the azygos vein.  Again, the azygos vein is enlarged and well opacified. There is a small amount of contrast flow into a narrowed SVC as described. 
IMPRESSION
1.  Patent right upper extremity venous anatomy to the level of the azygos vein.
2.  Suspect moderately severe nearly occlusive stenosis of the SVC through which a double lumen catheter has been inserted.  The azygos vein is enlarged and is the dominant central collateral vessel in the chest. 
3.  Nonocclusive thrombus at the subclavian internal jugular venous junction on the left side where there are also two intravenous catheters with narrowing in this region.  Despite this, the left upper extremity venous anatomy remains patent again to the level of the azygos vein.

[Series 1: run · 12 of 43 slices shown]
[im 2/43]
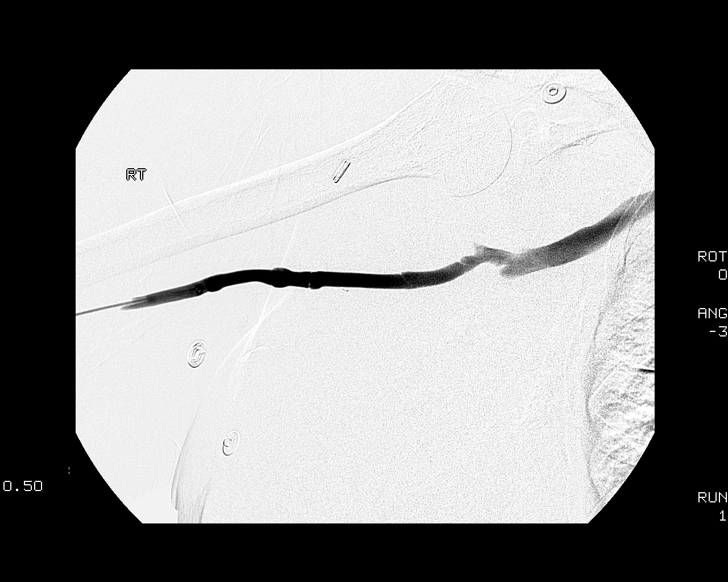
[im 6/43]
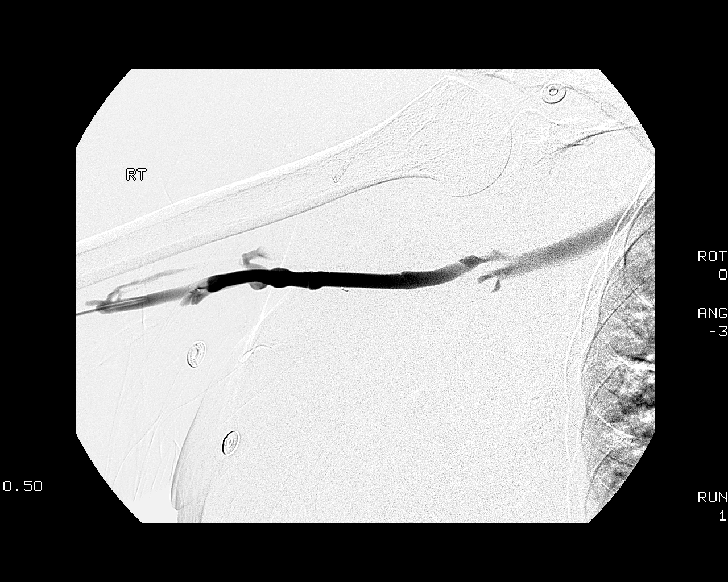
[im 10/43]
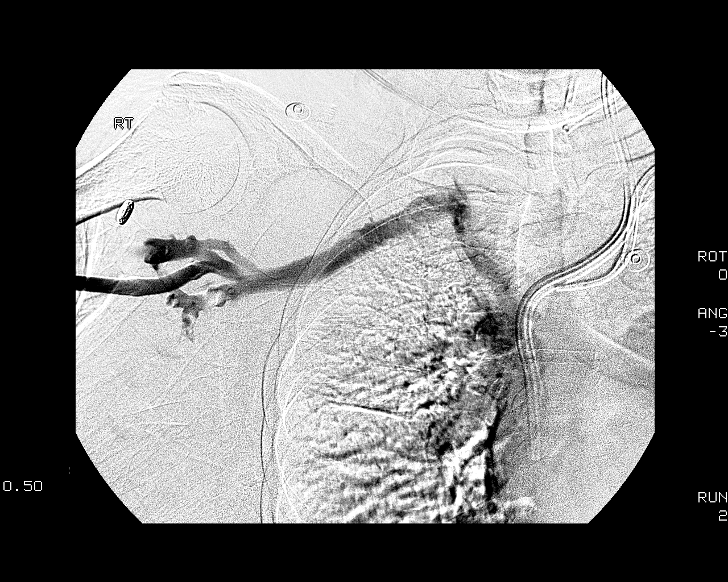
[im 13/43]
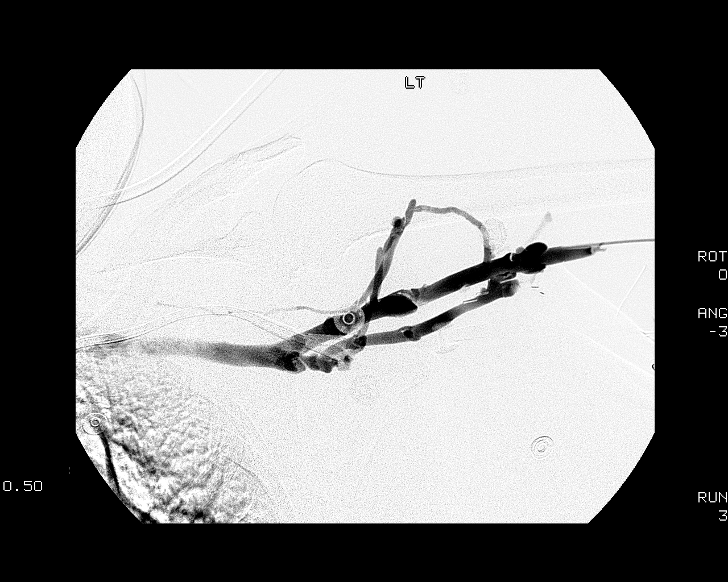
[im 17/43]
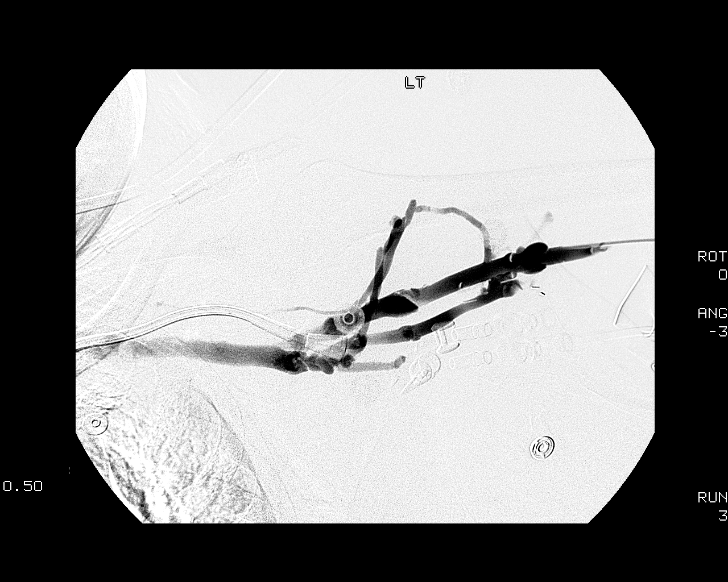
[im 21/43]
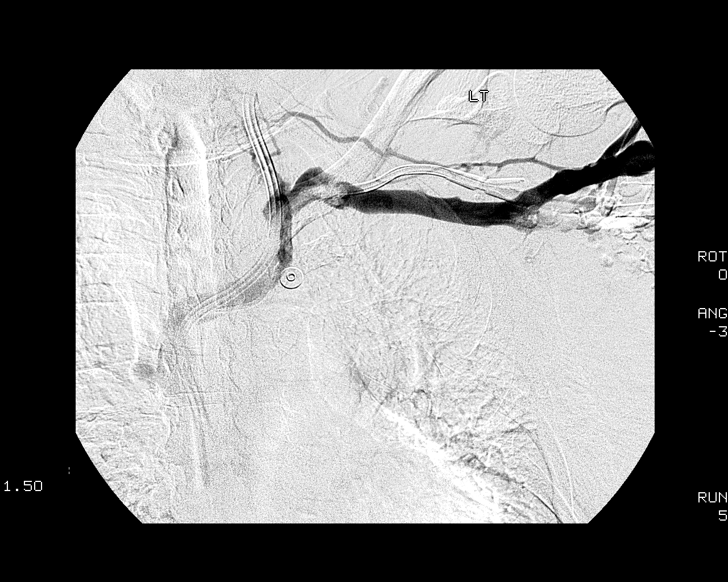
[im 24/43]
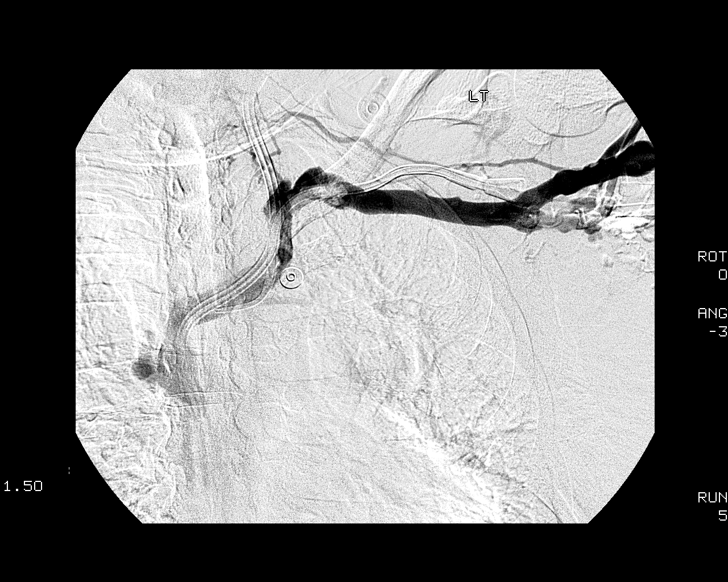
[im 28/43]
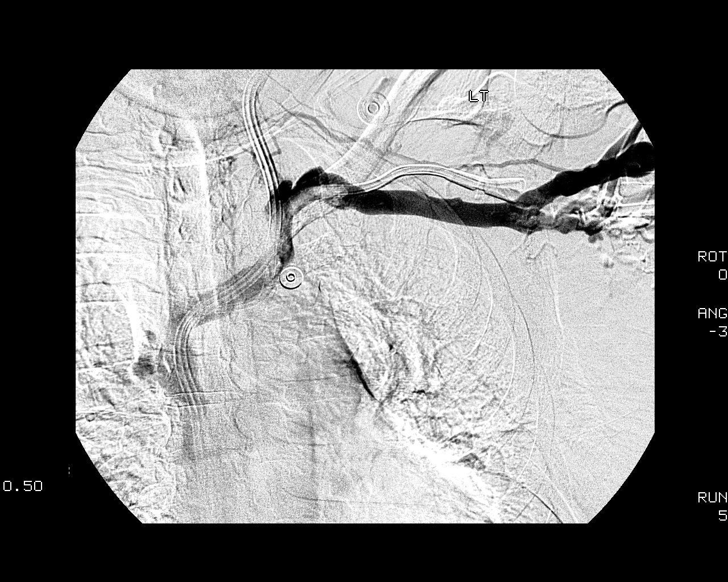
[im 32/43]
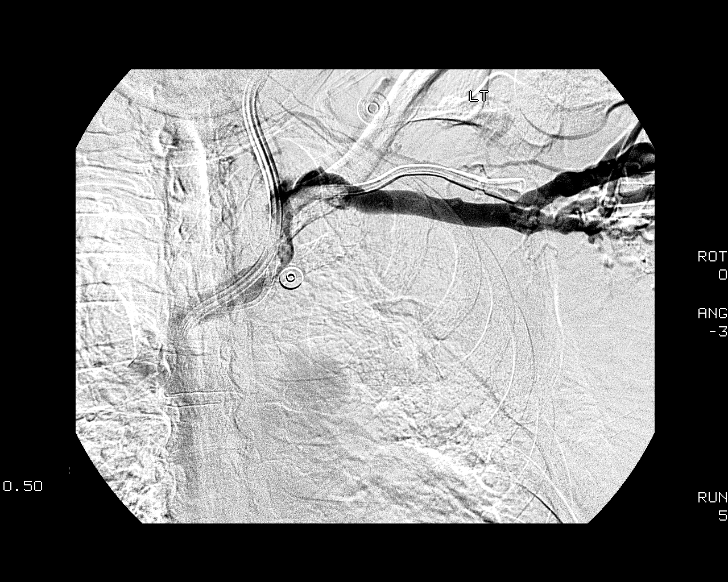
[im 35/43]
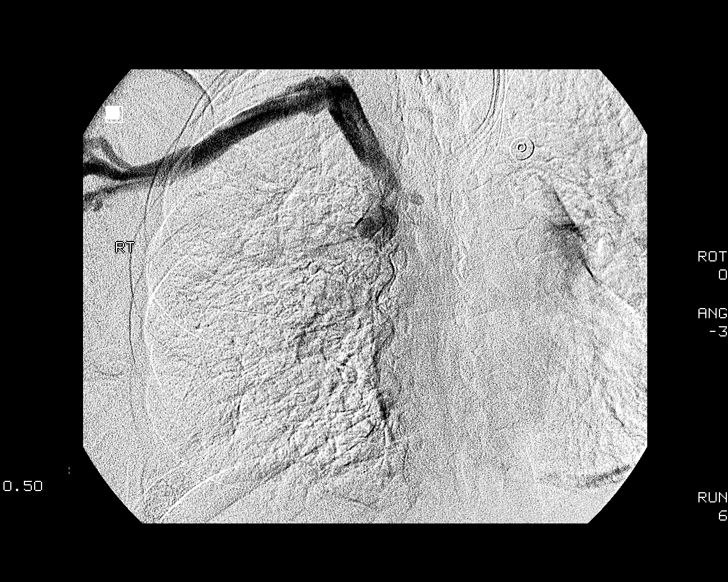
[im 39/43]
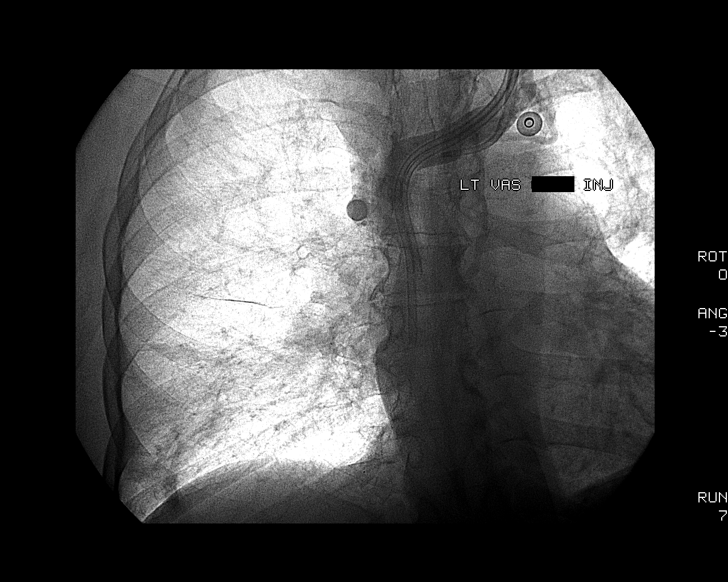
[im 43/43]
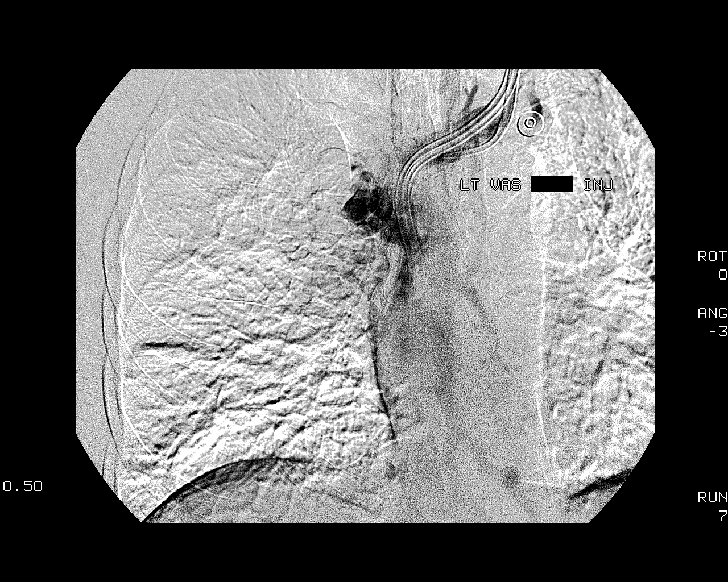

[12 of 24 positions shown; findings below may reference images not displayed]

## 2006-02-18 ENCOUNTER — Ambulatory Visit (HOSPITAL_COMMUNITY): Admission: RE | Admit: 2006-02-18 | Discharge: 2006-02-18 | Payer: Self-pay | Admitting: Nephrology

## 2006-02-20 IMAGING — CR DG CHEST 2V
2 series · 2 of 2 positions shown · non-contrast
Comparison: none

CLINICAL DATA: Renal failure.  History of   congestive heart failure.  Non-smoker.  Preop for operating [HOSPITAL]/[DATE].  
 TWO VIEW CHEST:
 PA and lateral views of the chest are made and are compared to previous study of 10/03/04 at 5753 hours using portable techniques and show the Diatek catheters to remain in good position in the superior vena cava.  There is no pneumothorax.  The heart is within normal limits.  There is no definite edema.  There is generalized peribronchial thickening and mild flattening of the hemidiaphragms.  There is some increase in AP diameter of the chest, suggesting a mild degree of chronic obstructive pulmonary disease.

[view not recorded (1 of 2)]
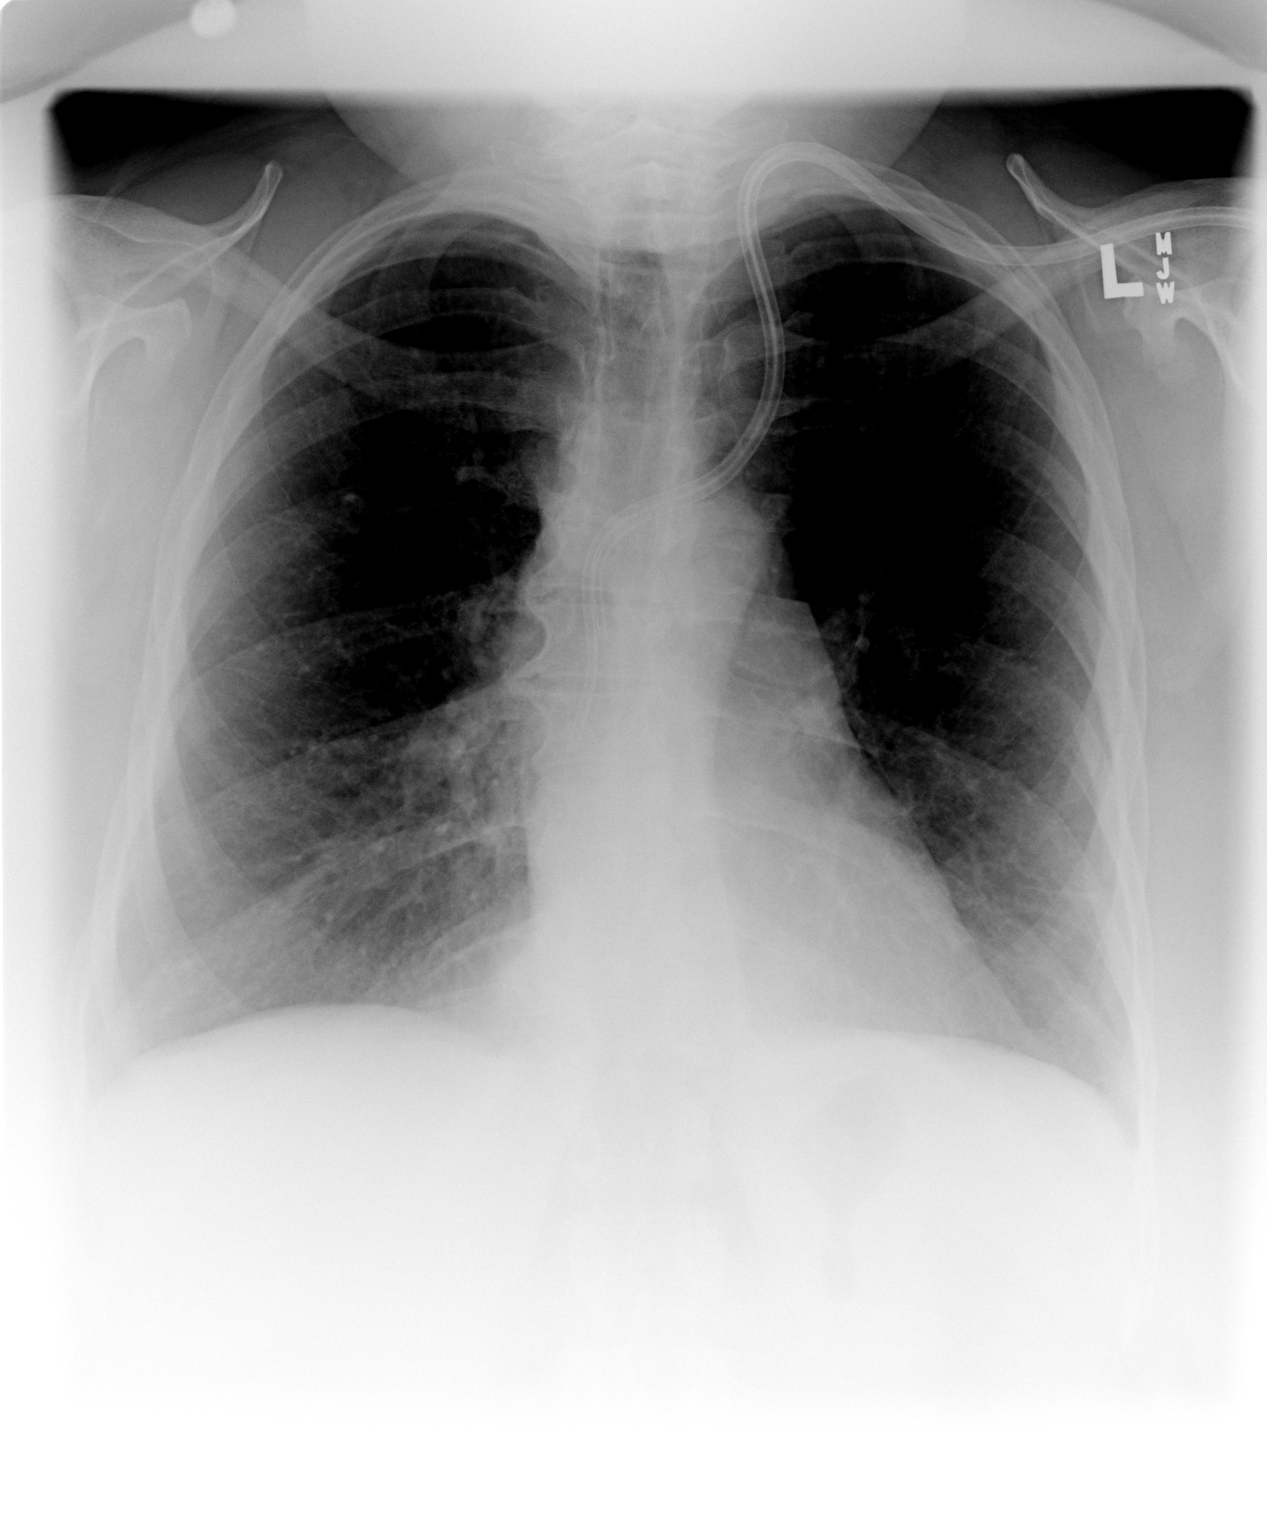

[view not recorded (2 of 2)]
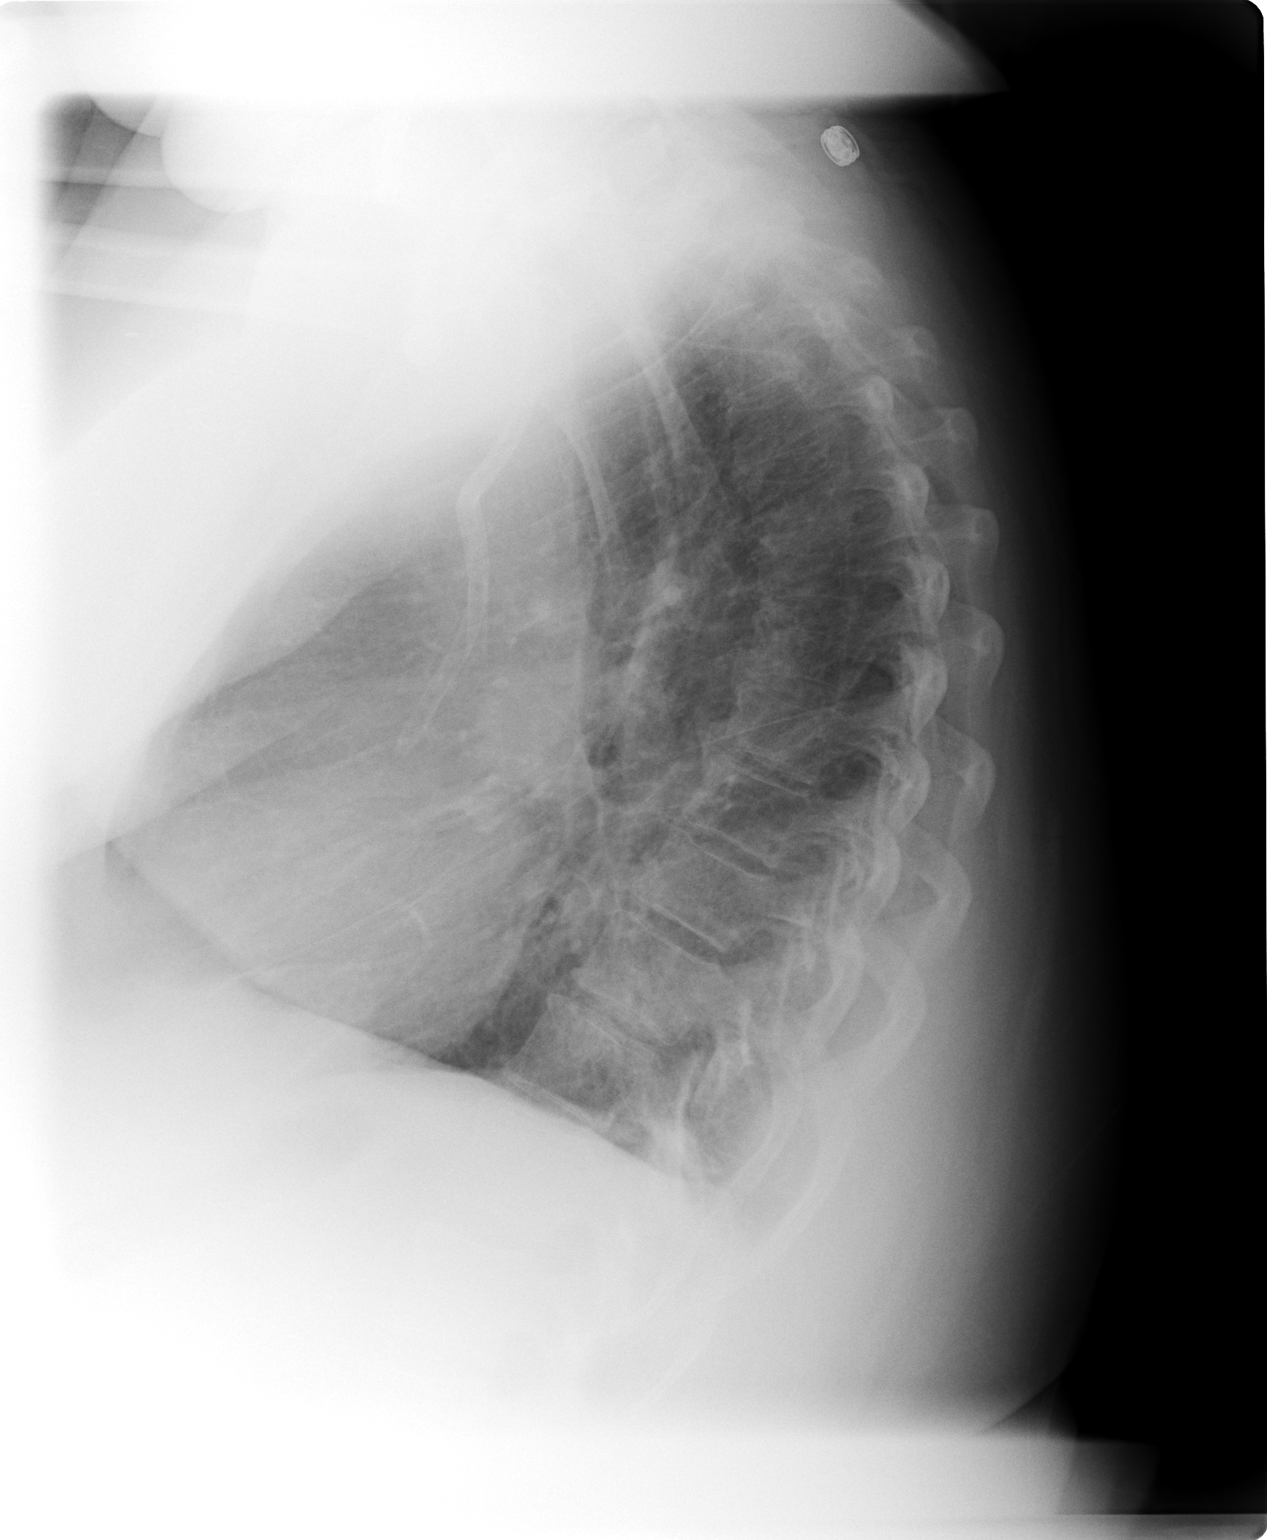

[2 of 2 positions shown; findings below may reference images not displayed]

IMPRESSION: No evidence of active cardiopulmonary disease.  Diatek catheters appear to be in good position.

## 2006-05-25 IMAGING — CR DG CHEST 1V PORT
1 series · 1 of 1 positions shown · non-contrast
Comparison: 2 view chest x-ray 10/09/2004.

CLINICAL DATA: Cujic catheter placement. Ventilator dependent respiratory
failure.

PORTABLE CHEST - 1 VIEW  [DATE]/4000 9000 hours:

[view not recorded]
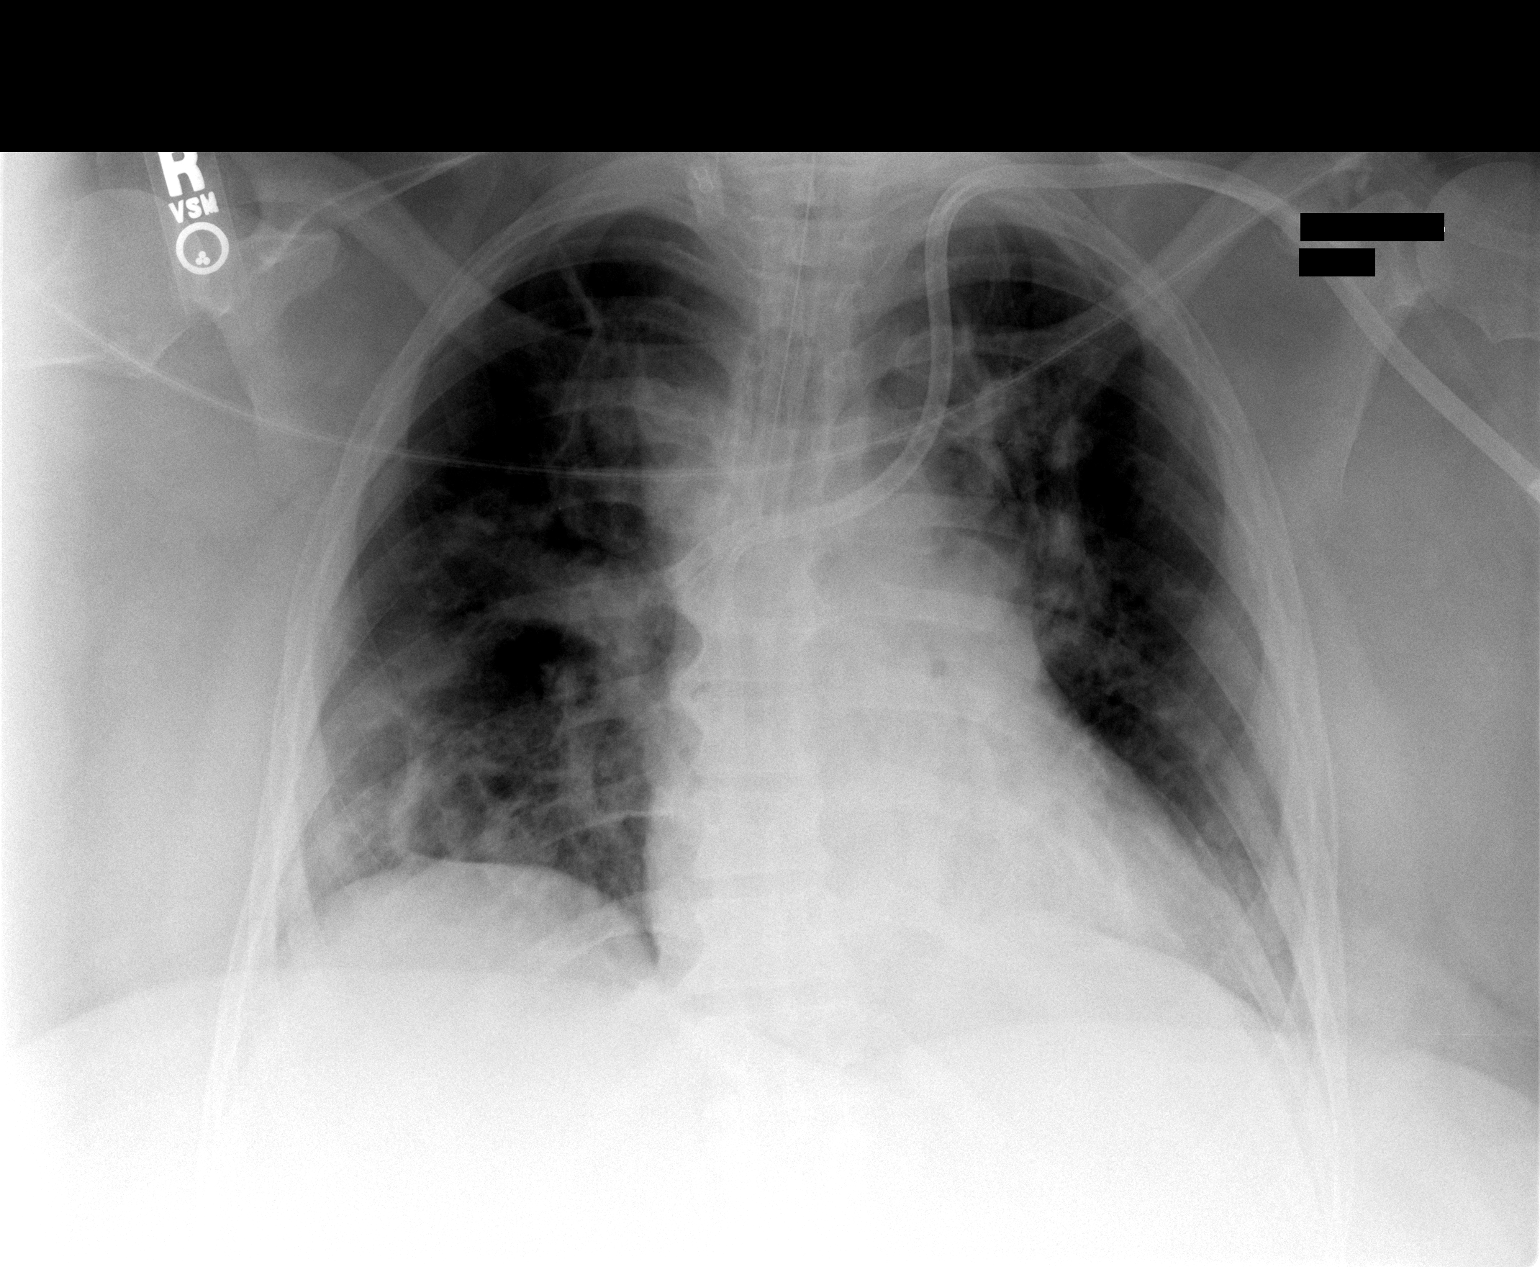

[1 of 1 positions shown; findings below may reference images not displayed]

FINDINGS: The left jugular dialysis catheter tips are at the junction of the
left innominate vein with the SVC and in the mid SVC. There is no pneumothorax
or mediastinal hematoma. The endotracheal tube is low, at the carina. Airspace
opacities are present in both lungs. The heart is upper normal in size perhaps
slightly large for the AP portable technique.
IMPRESSION: 1. The dialysis catheter tips are at the junction of the left innominate vein
with the SVC and in the mid SVC. No pneumothorax.

2. Endotracheal tube low, at the level of the carina. This should be withdrawn
approximately 4 cm.

3. Bilateral airspace opacities which are more consistent with pneumonia than
edema.

## 2006-05-25 IMAGING — CR DG CHEST 1V PORT
1 series · 1 of 1 positions shown · non-contrast
Comparison: none

CLINICAL DATA: Reintubation. 
PORTABLE CHEST ? 01/11/05 ([DATE] HOURS):
Compared to a portable film from earlier today, the tip of the endotracheal tube is approximately 3.3 cm above the carina.   Patchy opacities remain bilaterally, which may represent atelectasis, edema or pneumonia.  The central venous catheter is unchanged.

[view not recorded]
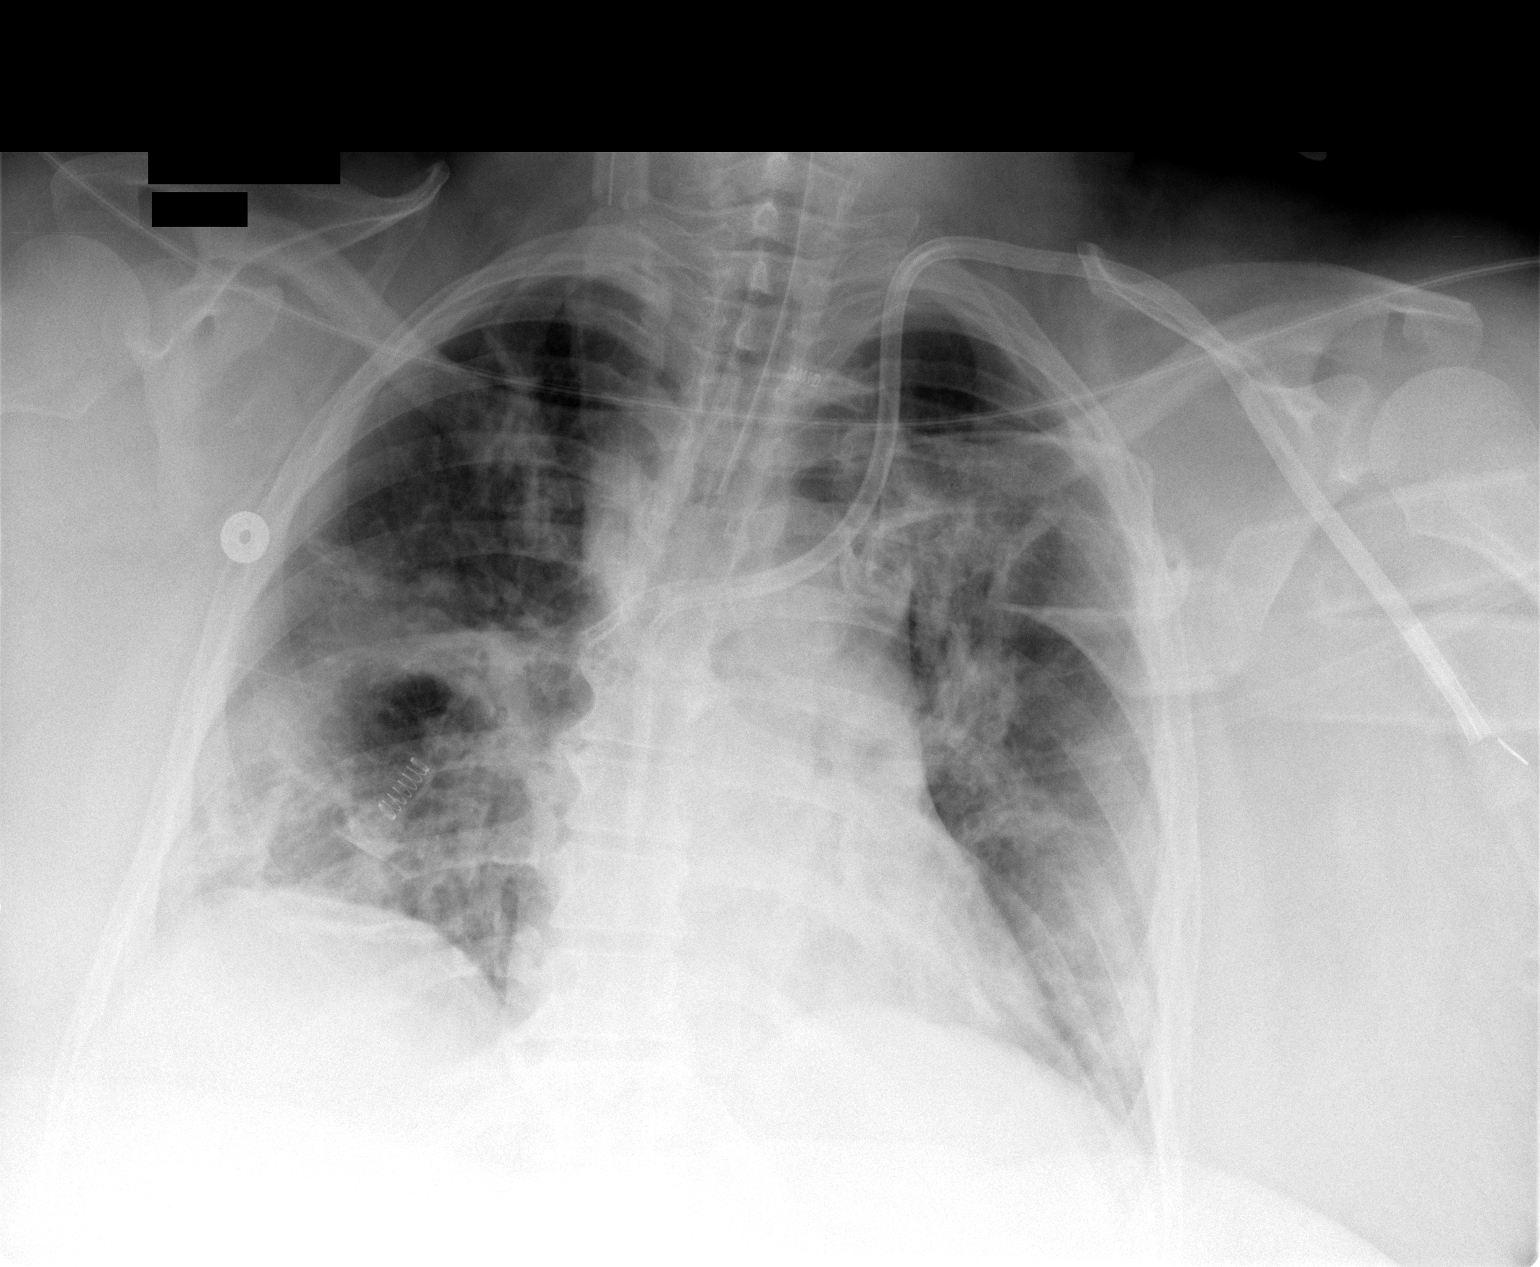

[1 of 1 positions shown; findings below may reference images not displayed]

IMPRESSION: Endotracheal tube tip 3.3 cm above the carina.  Increase in patchy lung opacities consistent with atelectasis, pneumonia or asymmetrical edema.

## 2006-05-28 IMAGING — CR DG CHEST 1V PORT
1 series · 1 of 1 positions shown · non-contrast
Comparison: none

CLINICAL DATA: 43 year-old with clotted graft
 PORTABLE CHEST:
 Single portable view of the chest is compared to previous study from yesterday.  The left IJ catheter is stable.  Endotracheal tube has been removed.  Persistent patchy interstitial and airspace process probably a combination of edema and infiltrates.

[view not recorded]
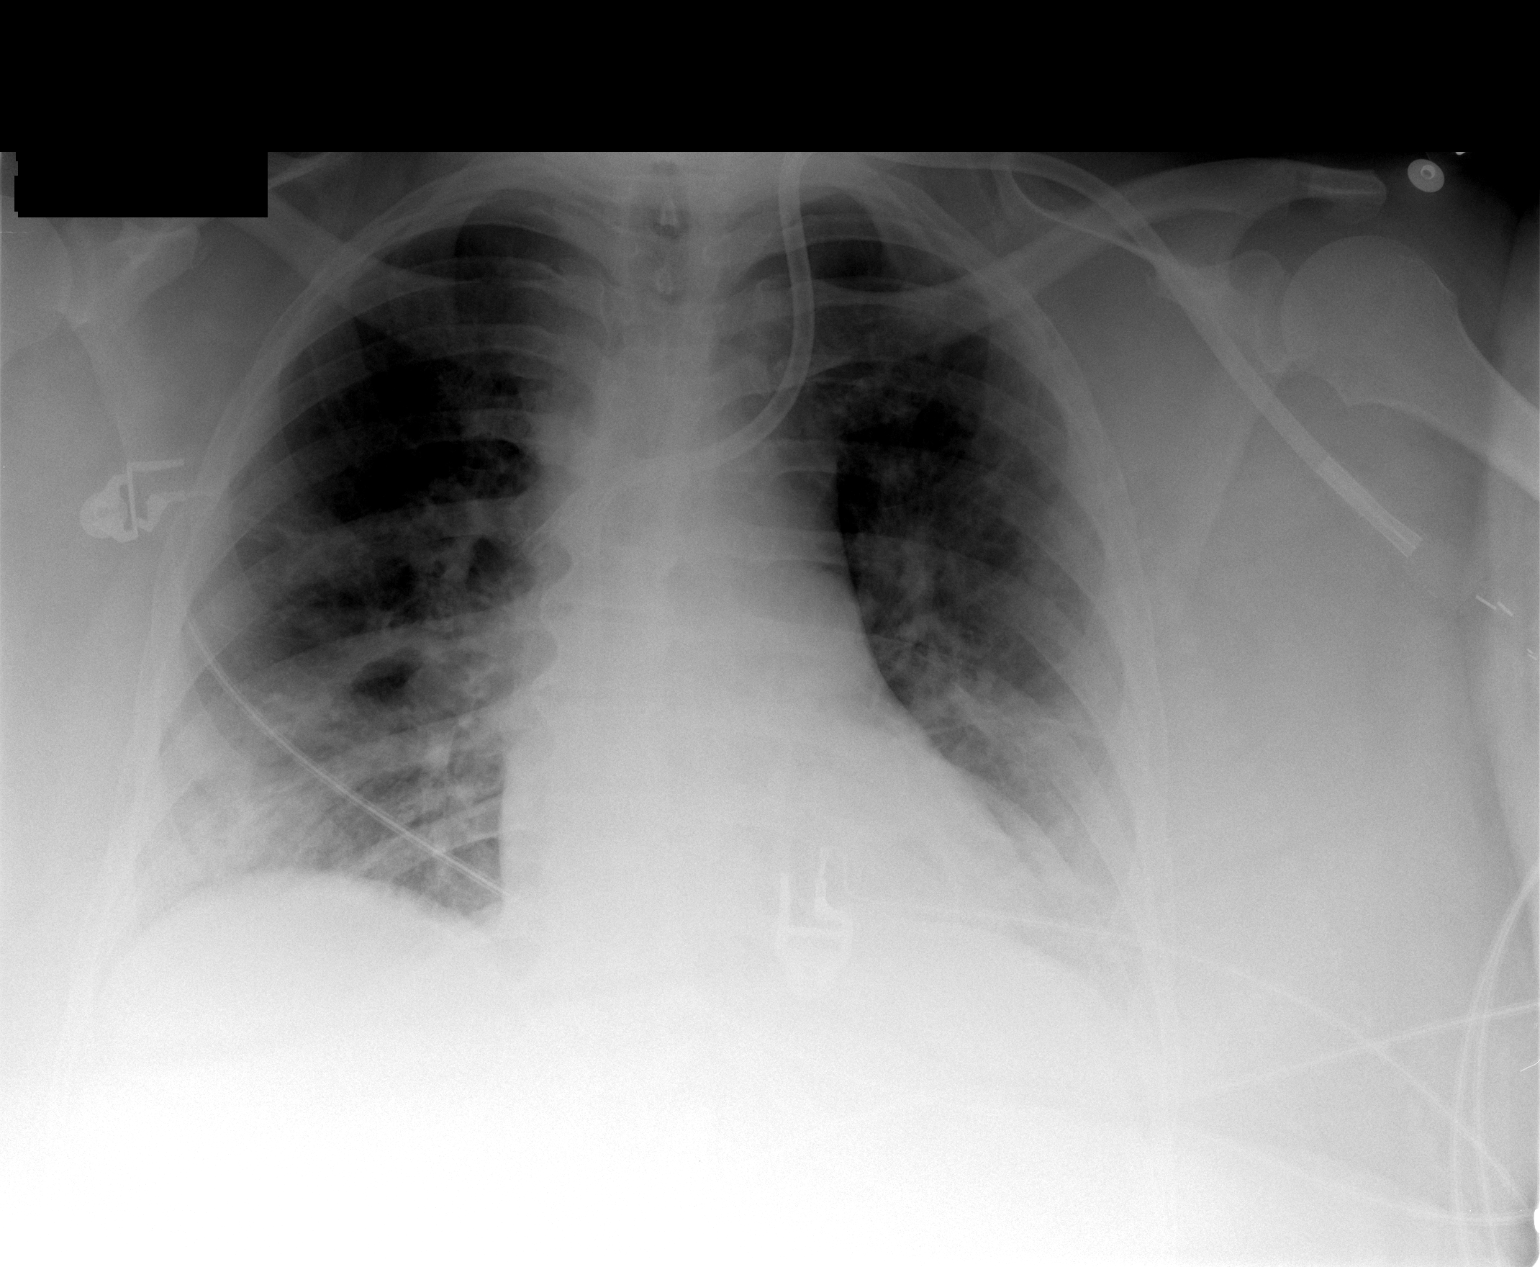

[1 of 1 positions shown; findings below may reference images not displayed]

IMPRESSION: Persistent interstitial and airspace process probably a combination of edema and infiltrates.

## 2006-06-22 ENCOUNTER — Inpatient Hospital Stay (HOSPITAL_COMMUNITY): Admission: EM | Admit: 2006-06-22 | Discharge: 2006-06-28 | Payer: Self-pay | Admitting: Emergency Medicine

## 2006-08-20 ENCOUNTER — Emergency Department (HOSPITAL_COMMUNITY): Admission: EM | Admit: 2006-08-20 | Discharge: 2006-08-21 | Payer: Self-pay | Admitting: Emergency Medicine

## 2006-08-21 ENCOUNTER — Encounter: Payer: Self-pay | Admitting: Vascular Surgery

## 2006-08-21 ENCOUNTER — Inpatient Hospital Stay (HOSPITAL_COMMUNITY): Admission: EM | Admit: 2006-08-21 | Discharge: 2006-08-23 | Payer: Self-pay | Admitting: Emergency Medicine

## 2006-08-21 ENCOUNTER — Ambulatory Visit (HOSPITAL_COMMUNITY): Admission: RE | Admit: 2006-08-21 | Discharge: 2006-08-21 | Payer: Self-pay | Admitting: Emergency Medicine

## 2006-08-28 ENCOUNTER — Encounter: Admission: RE | Admit: 2006-08-28 | Discharge: 2006-08-28 | Payer: Self-pay | Admitting: Nephrology

## 2006-09-01 IMAGING — XA IR [PERSON_NAME]/EXT/BI
1 series · 15 of 24 positions shown · IV contrast (omnipaque)
Comparison: none

CLINICAL DATA: The patient has a history of end-stage renal disease and numerous failed dialysis grafts.  She is currently dialyzed through a left jugular tunneled catheter.  Further venogram assessment is now performed prior to planned dialysis graft placement.  The patient has also had a failed prior graft placement in the left thigh.
1.  BILATERAL UPPER EXTREMITY VENOGRAPHY.
2.  BILATERAL LOWER EXTREMITY VENOGRAPHY WITH ULTRASOUND-GUIDED VASCULAR ACCESS OF BOTH FEMORAL VEINS:
3.  IVC VENOGRAM:
Date:  04/20/05: 
Contrast:  75 cc Omnipaque 300.
Fluoro Time:  3.1 minutes.
Due to known contrast allergy the patient was fully premedicated per protocol which included administration of prednisone and Benadryl.  During the procedure the patient also received 25 mg of IV Benadryl.
PROCEDURE:
Bilateral upper extremity venous access was established and upper extremity venograms performed with visualization of venous flow into the chest.
The femoral veins were then localized with ultrasound.  Both groins were sterilely prepped and draped.  Local anesthesia was provided with 1% lidocaine.  Under direct ultrasound guidance both the right common femoral vein and the left common femoral vein were punctured with 21 gauge needles in their inferior aspects.  Image documentation was performed.  5 French micropuncture dilators were then place over guidewires.  These were utilized in performing bilateral lower extremity venography from the level of the common femoral veins to the IVC.  Additional full venographic assessment of the inferior vena cava was also performed.  After the procedure venous access was removed and compression held over both groin regions.

[Series 1: run · 15 of 139 slices shown]
[im 1/139]
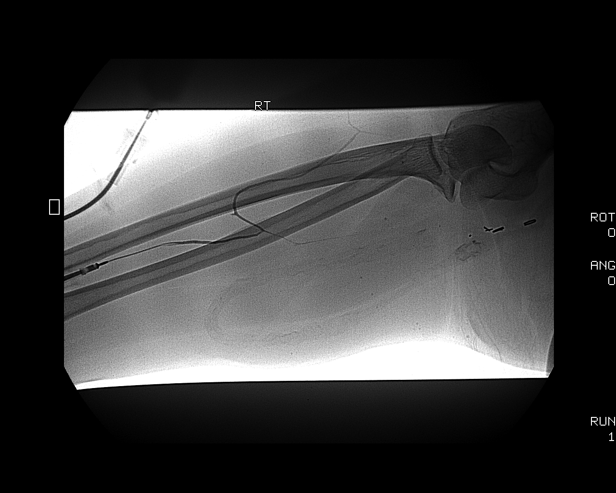
[im 13/139]
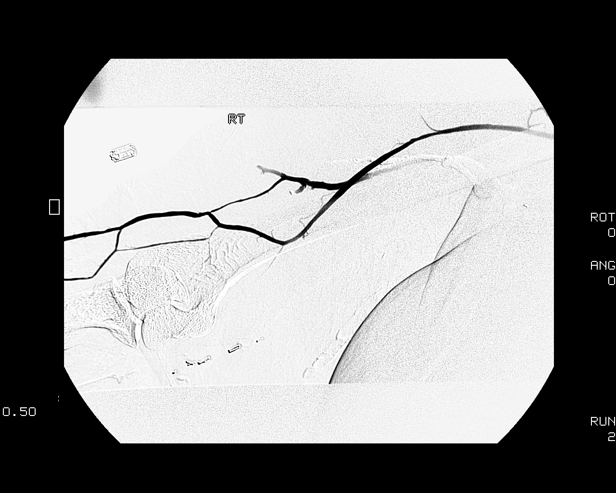
[im 25/139]
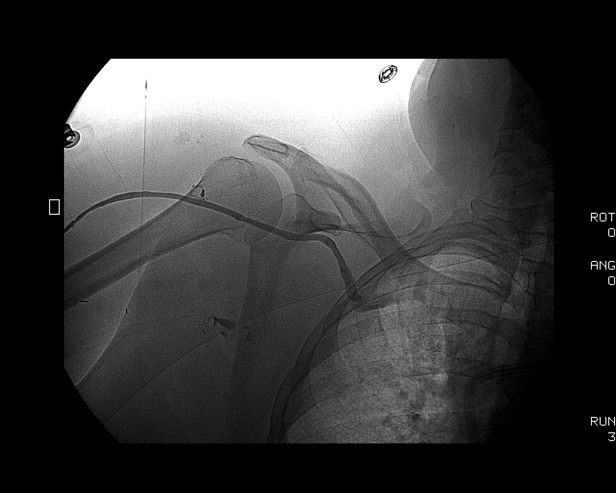
[im 31/139]
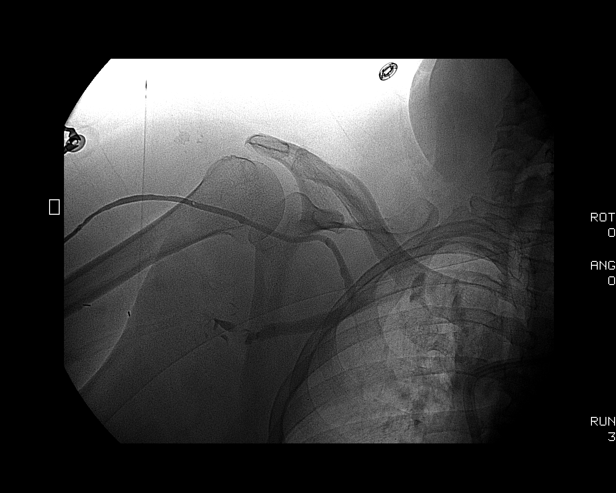
[im 43/139]
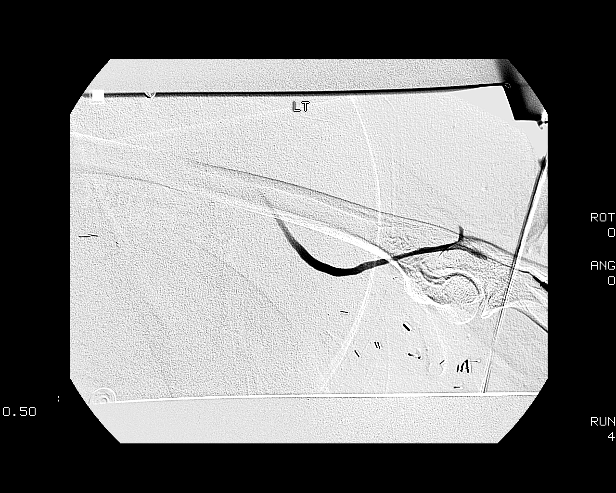
[im 49/139]
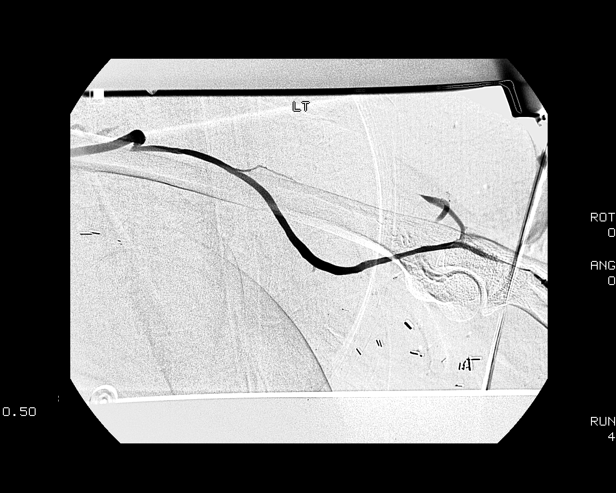
[im 61/139]
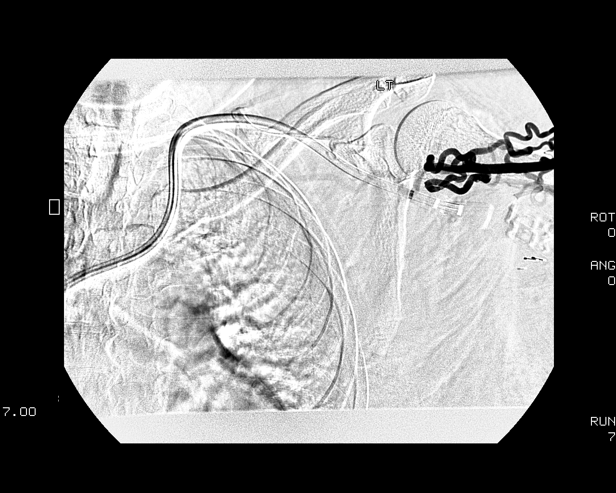
[im 73/139]
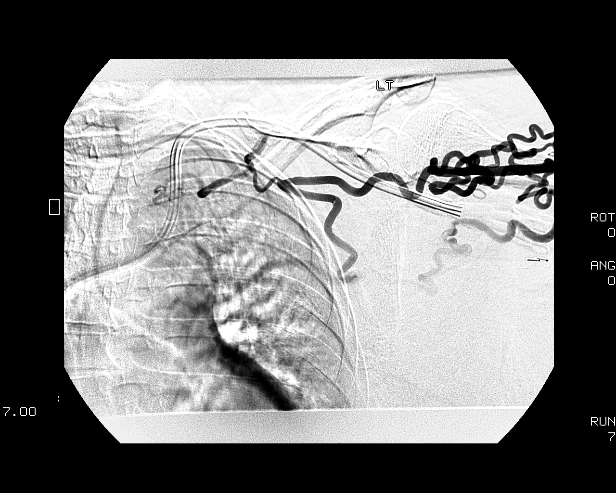
[im 79/139]
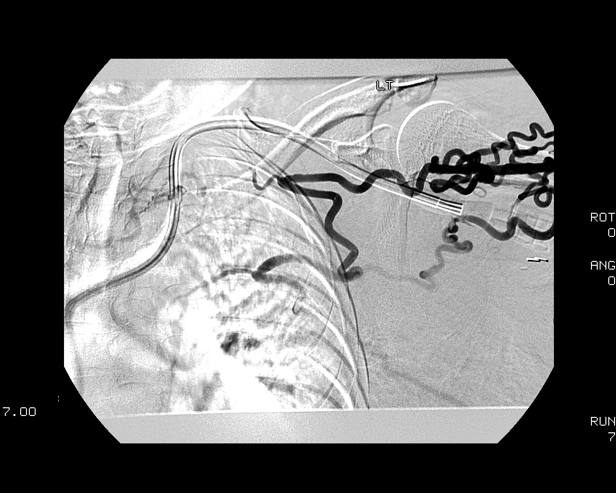
[im 91/139]
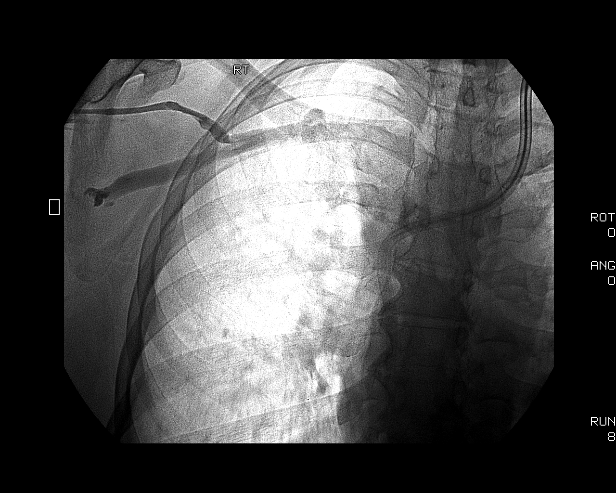
[im 97/139]
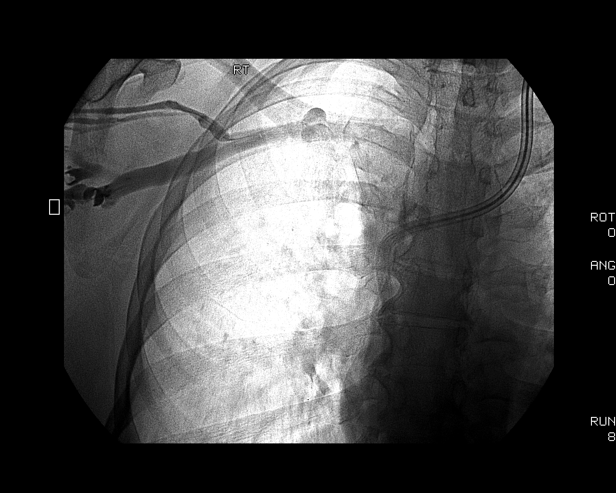
[im 109/139]
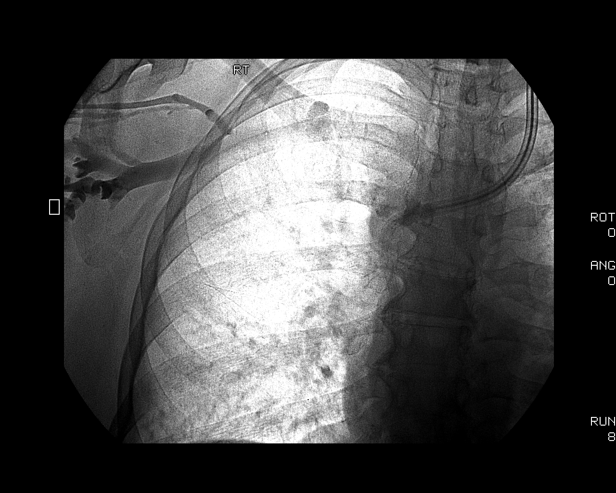
[im 121/139]
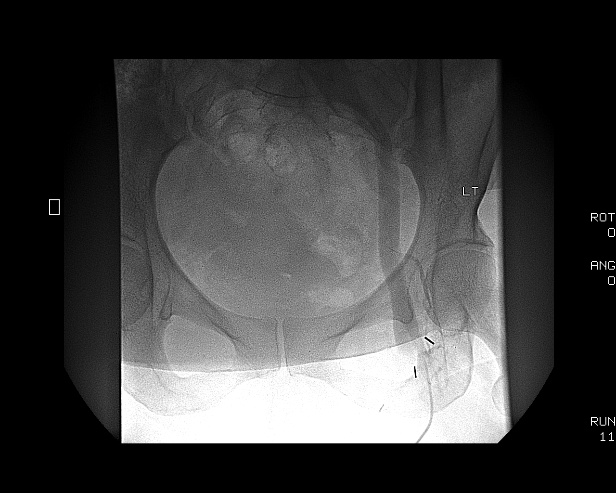
[im 127/139]
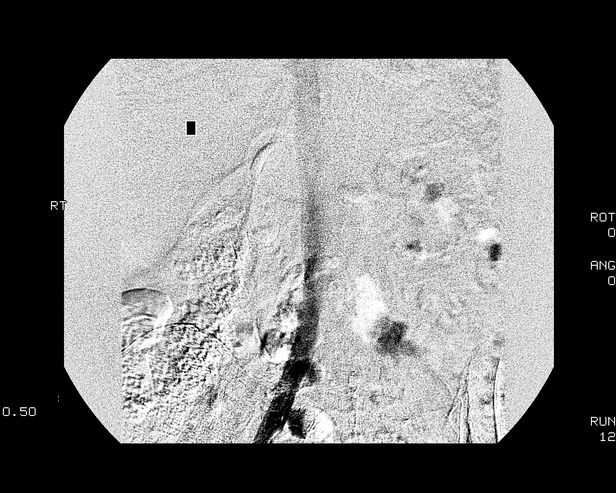
[im 139/139]
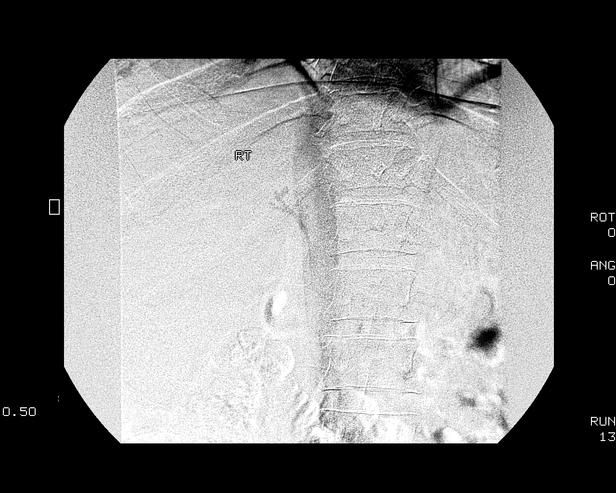

[15 of 24 positions shown; findings below may reference images not displayed]

FINDINGS: RIGHT UPPER EXTREMITY:
Access was accomplished in a very tiny forearm vein.  With injection extremely small veins are noted primarily filling superficial channels and eventually draining via the cephalic vein into the chest.  This cephalic vein is noted to be very diffusely small in caliber.  As contrast enters the right axillary and subclavian venous segments, there is very slow flow present.  The right subclavian vein is not stenotic.  The right brachiocephalic vein appears mildly and diffusely narrowed.  It was not however, able to be completely filled with contrast due to slow flow.  No collateral flow was present.  The SVC was also difficult to fully opacify but did show normal flow.
LEFT UPPER EXTREMITY:
A vein near the antecubital fossa was able to be cannulated.  Injection shows filling of a fairly small caliber upper arm vein which is superficial in location.  Numerous superficial collaterals then begin to fill in a very slow fashion around the shoulder and injected contrast then fills only small collateral veins.  There is no significant reconstitution of the left subclavian or brachiocephalic veins and the findings are consistent with chronic left axillo-subclavian occlusion.  The left brachiocephalic vein may also be essentially occluded around the pre-existing dialysis catheter.  
RIGHT LOWER EXTREMITY:
The right femoral vein was able to be punctured well below the femoral head and likely just inferior to the saphenofemoral junction.  The common femoral vein, external iliac vein, and common iliac vein are widely patent and show no evidence of stenosis.  Rapid flow is present into the inferior vena cava.
LEFT LOWER EXTREMITY:
The left common femoral vein, external iliac vein, and common iliac vein are widely patent.  Ultrasound did show a short segment of residual old graft material in the left thigh.  Clips are also seen related to prior graft placement.  Subjectively flow in the left femoral vein and iliac veins did not appear quite as rapid as on the right side although a part of this may relate to slightly longer and more tortuous course of the iliac veins on the left compared to the right.  
INFERIOR VENA CAVA:
The IVC is well demonstrated and widely patent throughout its entire course all the way to the level of the right atrium.
IMPRESSION: 1.  Right upper extremity shows very poor small caliber veins and slow flow through the subclavian and brachiocephalic venous segments without identifiable significant central venous stenosis or occlusion.  It likely would be a poor candidate for a graft given the appearance of the veins.
2.  Left upper extremity shows chronic axillosubclavian venous occlusion with filling of only collateral veins.  The brachiocephalic vein may also be essentially occluded and/or stenotic around a pre-existing left jugular dialysis catheter.
3.  Widely patent right-sided femoral and iliac veins.  The right thigh appears to be the best site for a dialysis graft.  
4.  Patent left common femoral and iliac veins.  The patient has had a prior failed graft in the left thigh.  Widely patent flow through the left iliac system is noted to be subjectively slightly slower than on the right.
5.  Normally patent IVC.

## 2006-09-07 DIAGNOSIS — M25419 Effusion, unspecified shoulder: Secondary | ICD-10-CM | POA: Insufficient documentation

## 2006-09-09 ENCOUNTER — Inpatient Hospital Stay (HOSPITAL_COMMUNITY): Admission: EM | Admit: 2006-09-09 | Discharge: 2006-09-17 | Payer: Self-pay | Admitting: Emergency Medicine

## 2006-09-11 IMAGING — CR DG CHEST 1V PORT
1 series · 1 of 1 positions shown · non-contrast
Comparison: 01/14/05.

CLINICAL DATA: Renal failure.  Right femoral Diatek catheter placement.
PORTABLE [id number] VIEW - 04/30/05 - 5404 HOURS:

[view not recorded]
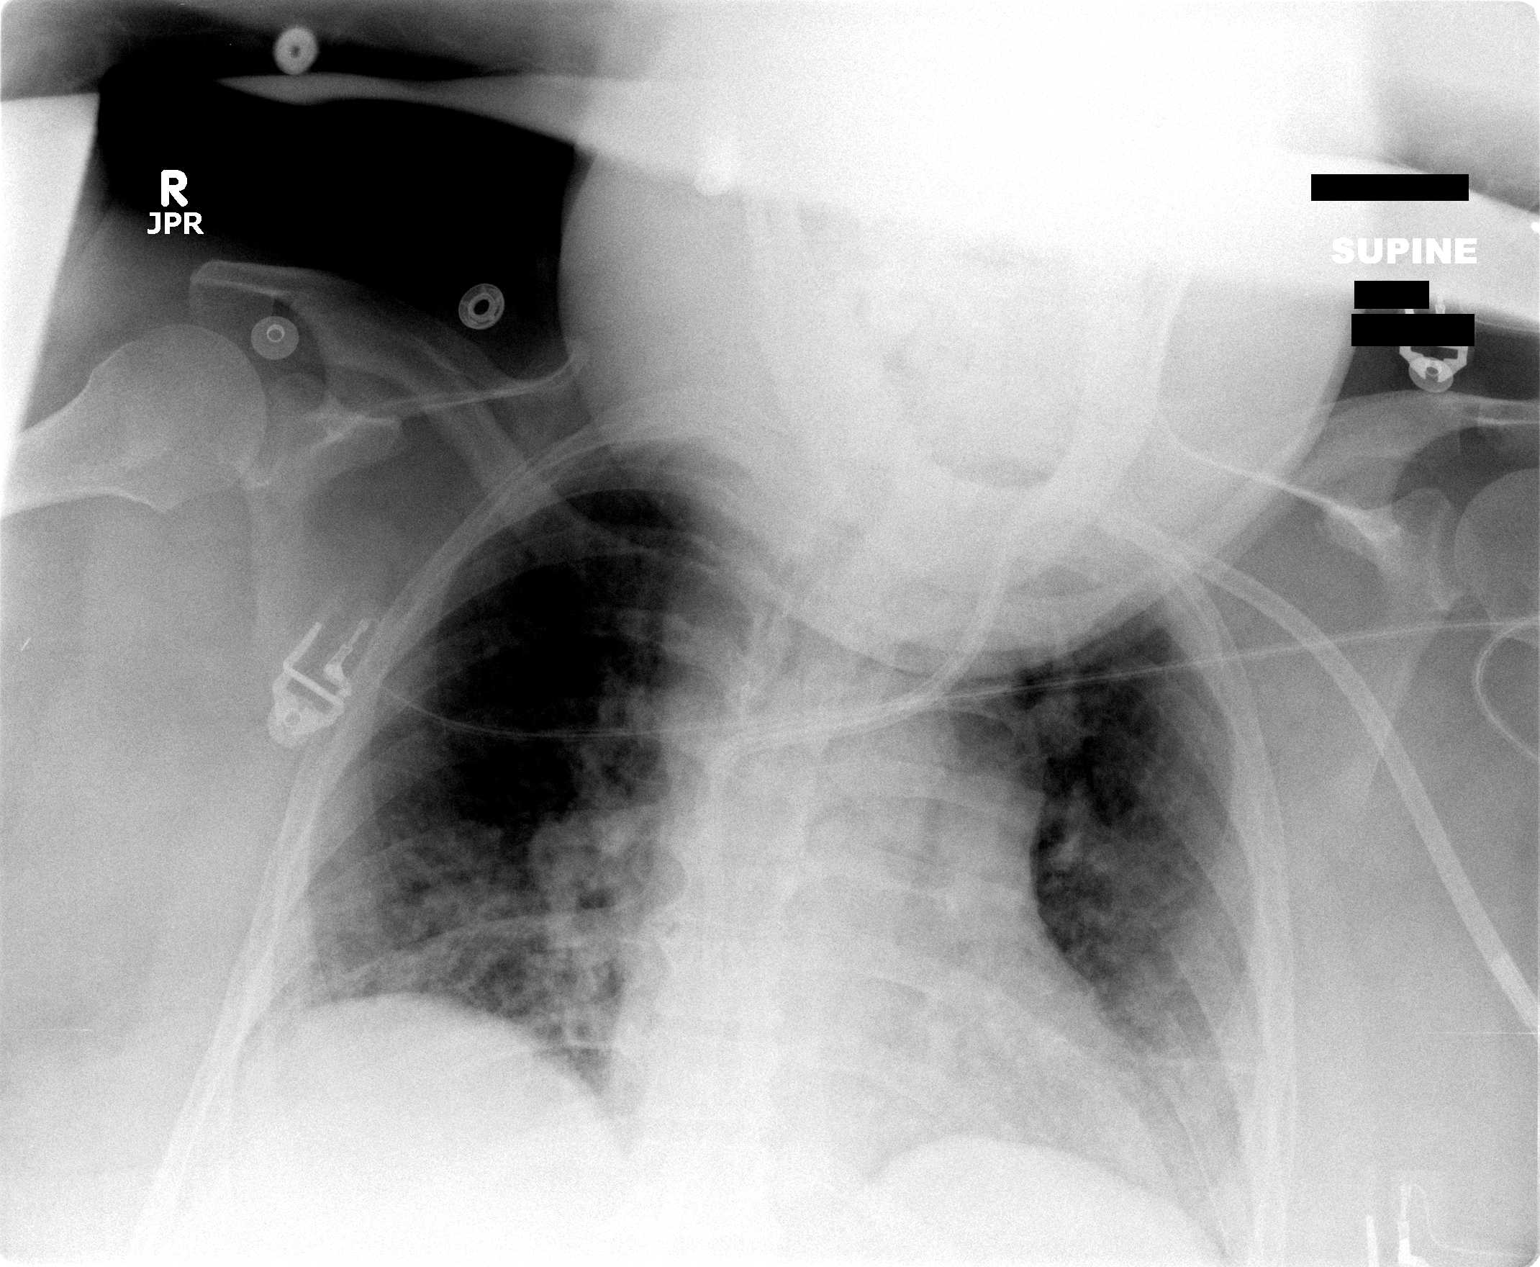

[1 of 1 positions shown; findings below may reference images not displayed]

There appears to have been interval revision of the pre-existing left internal jugular dialysis catheter with tips extending into the right atrium.  There does appear to be a second catheter extending superiorly from below the diaphragm, compatible with the given history of recently placed femoral Diatek catheter.  This appears to overlap the aforementioned catheter in the right atrium.  These catheter tips are difficult to visualize on this portable exam due to the patient?s body habitus.  There are low lung volumes with bibasilar atelectasis and vascular congestion.  Cardiomediastinal contours appear stable.
IMPRESSION: Both the left internal jugular and the right femoral dialysis catheter tips appear to be within the right atrium.  Visualization is limited and radiographic followup is suggested.  There is bibasilar atelectasis with probable mild edema.

## 2006-09-12 ENCOUNTER — Encounter (INDEPENDENT_AMBULATORY_CARE_PROVIDER_SITE_OTHER): Payer: Self-pay | Admitting: *Deleted

## 2006-09-20 IMAGING — CR DG CHEST 2V
3 series · 3 of 3 positions shown · non-contrast
Comparison: none

CLINICAL DATA: 43 year-old, chest pain, shortness of breath. Patient had dialysis yesterday.
 [id number] VIEW:
 Two views of the chest compared with prior films from 01/14/05 and 04/30/05.
 The left IJ Diatek catheter is in good position.  The heart size is normal.  No definite edema or effusions. Chronic stable interstitial scarring changes.

[view not recorded (1 of 3)]
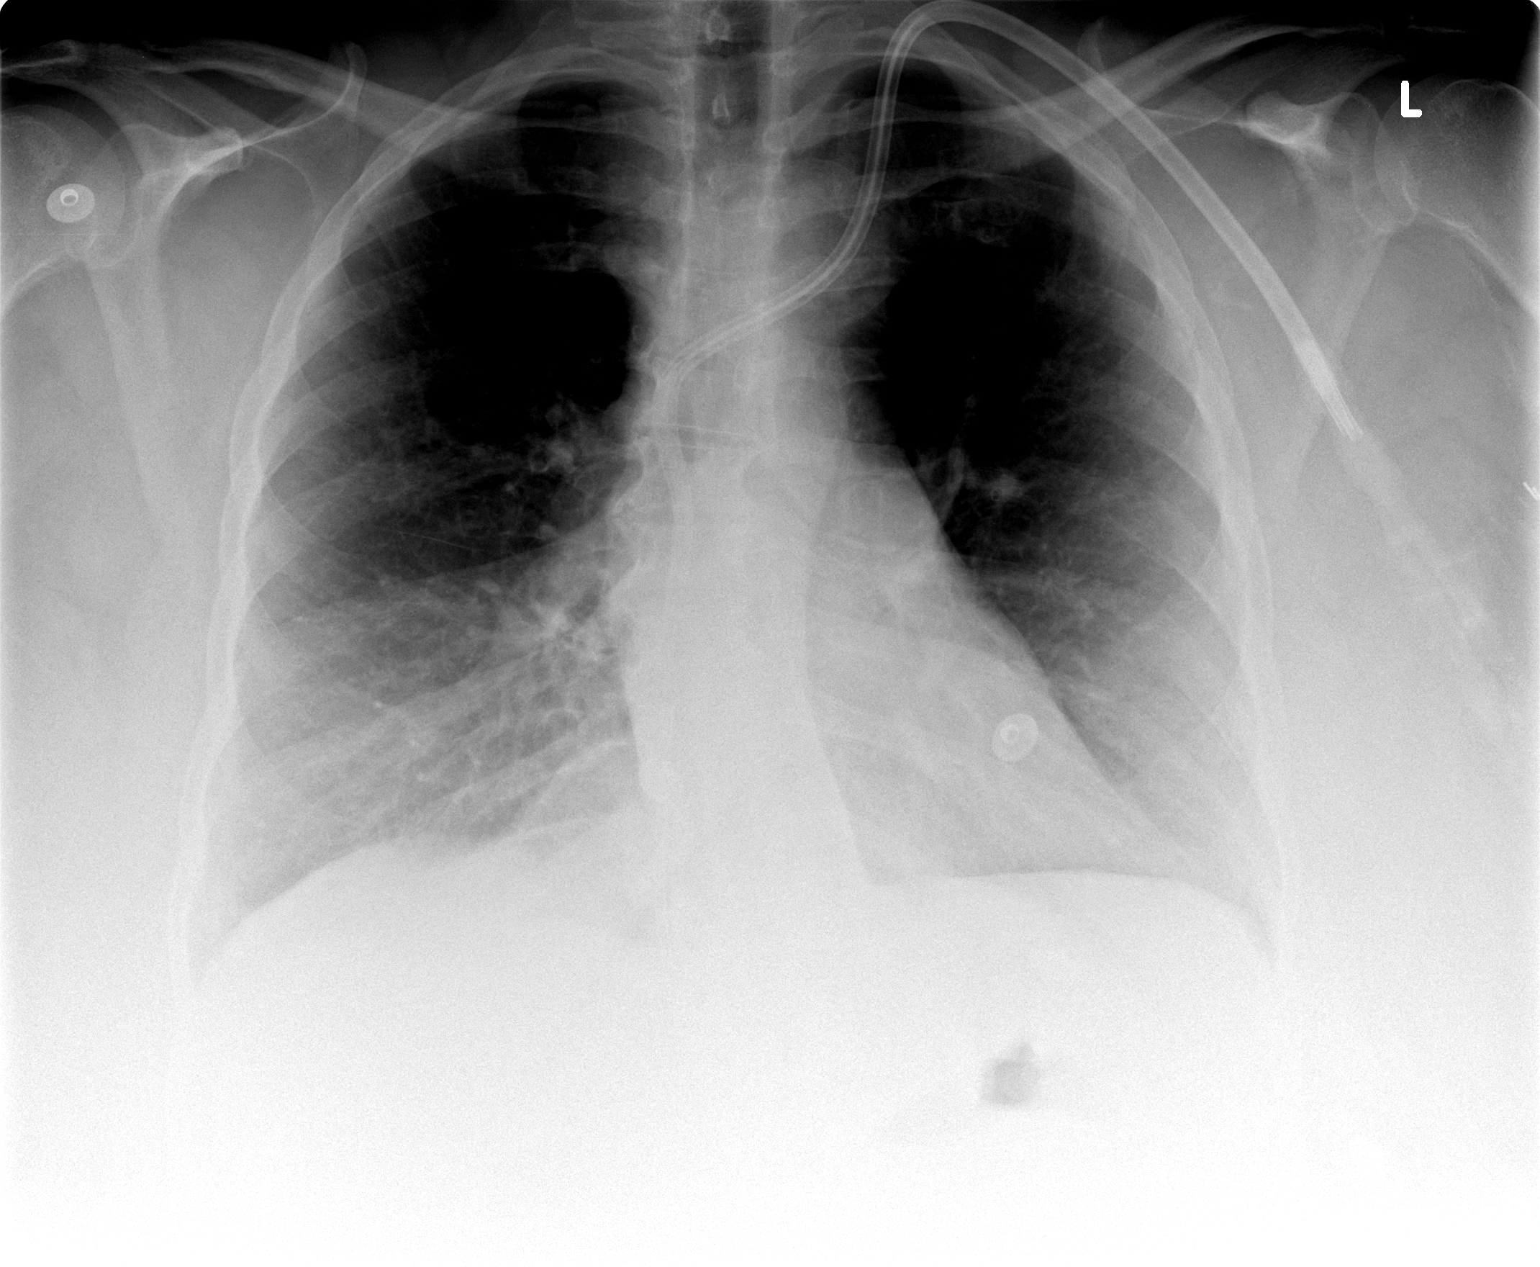

[view not recorded (2 of 3)]
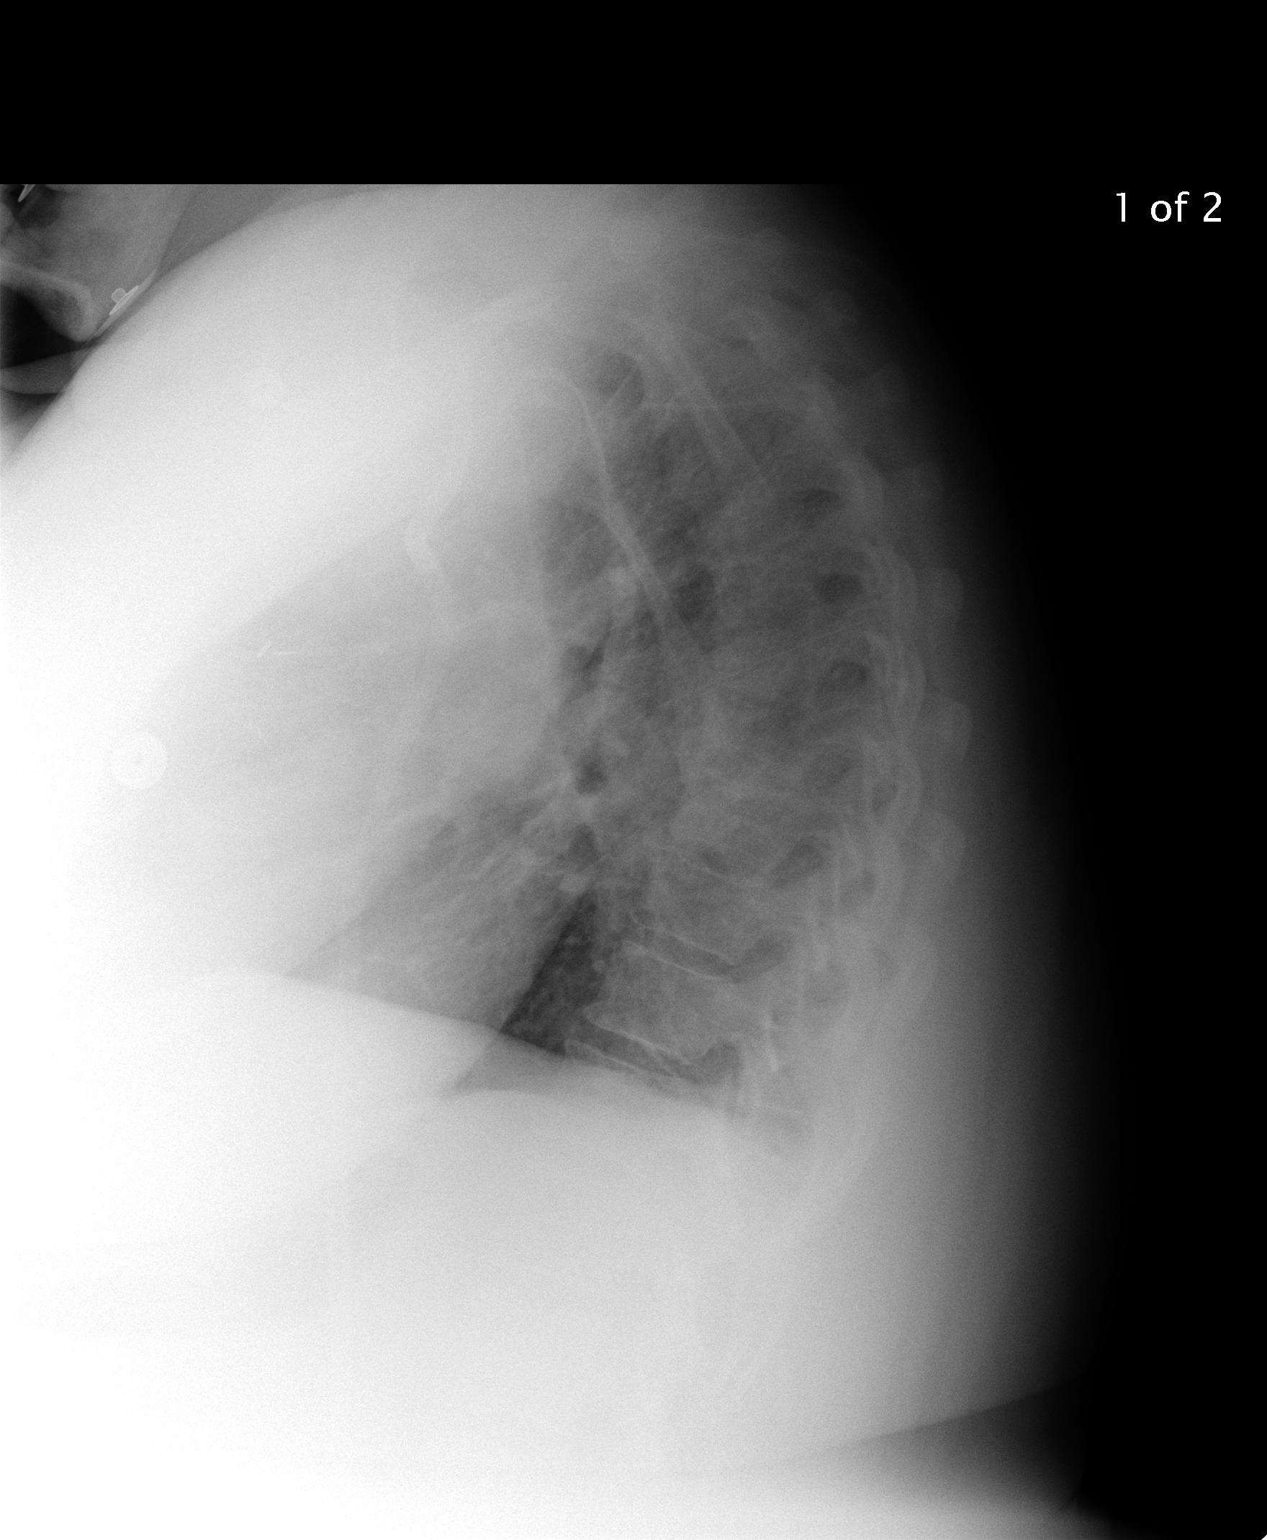

[view not recorded (3 of 3)]
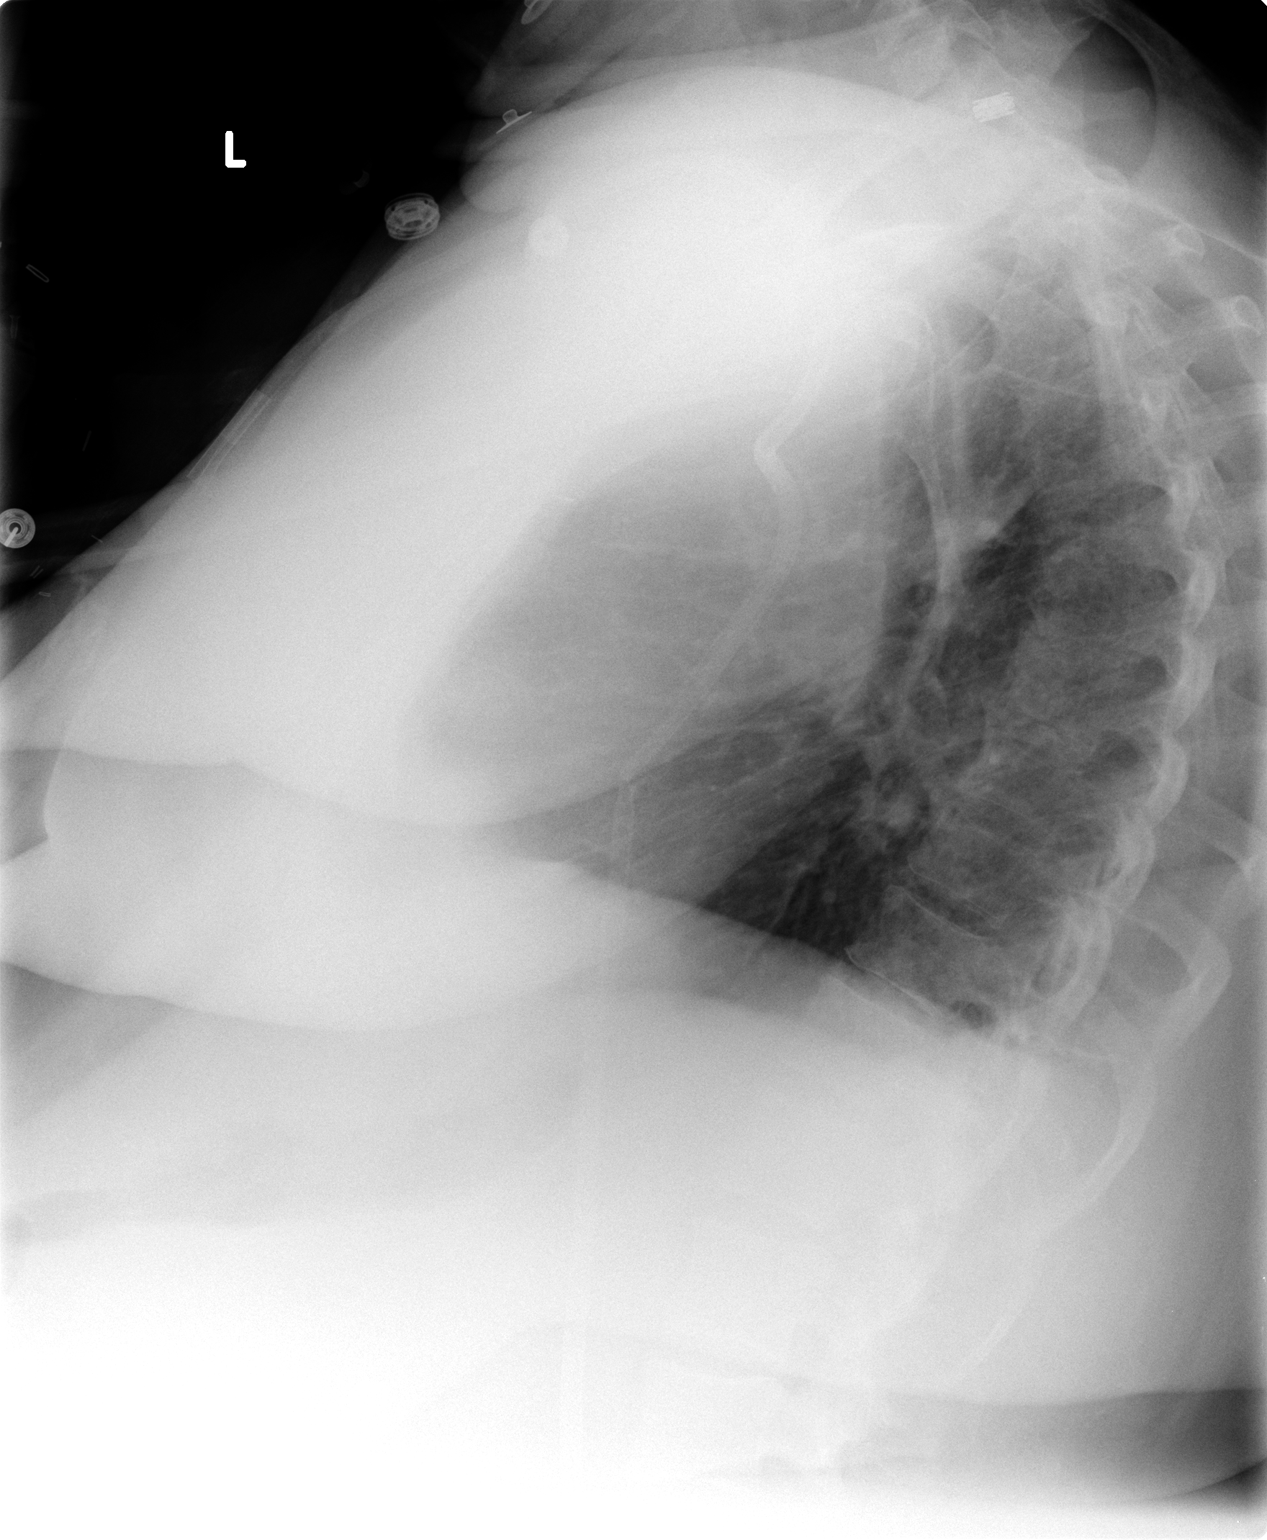

[3 of 3 positions shown; findings below may reference images not displayed]

IMPRESSION: No infiltrates or edema. Stable scarring changes.
 Normal heart size.

## 2006-11-09 IMAGING — CR DG CHEST 2V
2 series · 2 of 2 positions shown · non-contrast
Comparison: [DATE].

CLINICAL DATA: Dialysis catheter exchange, preoperative evaluation. 
 CHEST - 2 VIEW:

[view not recorded (1 of 2)]
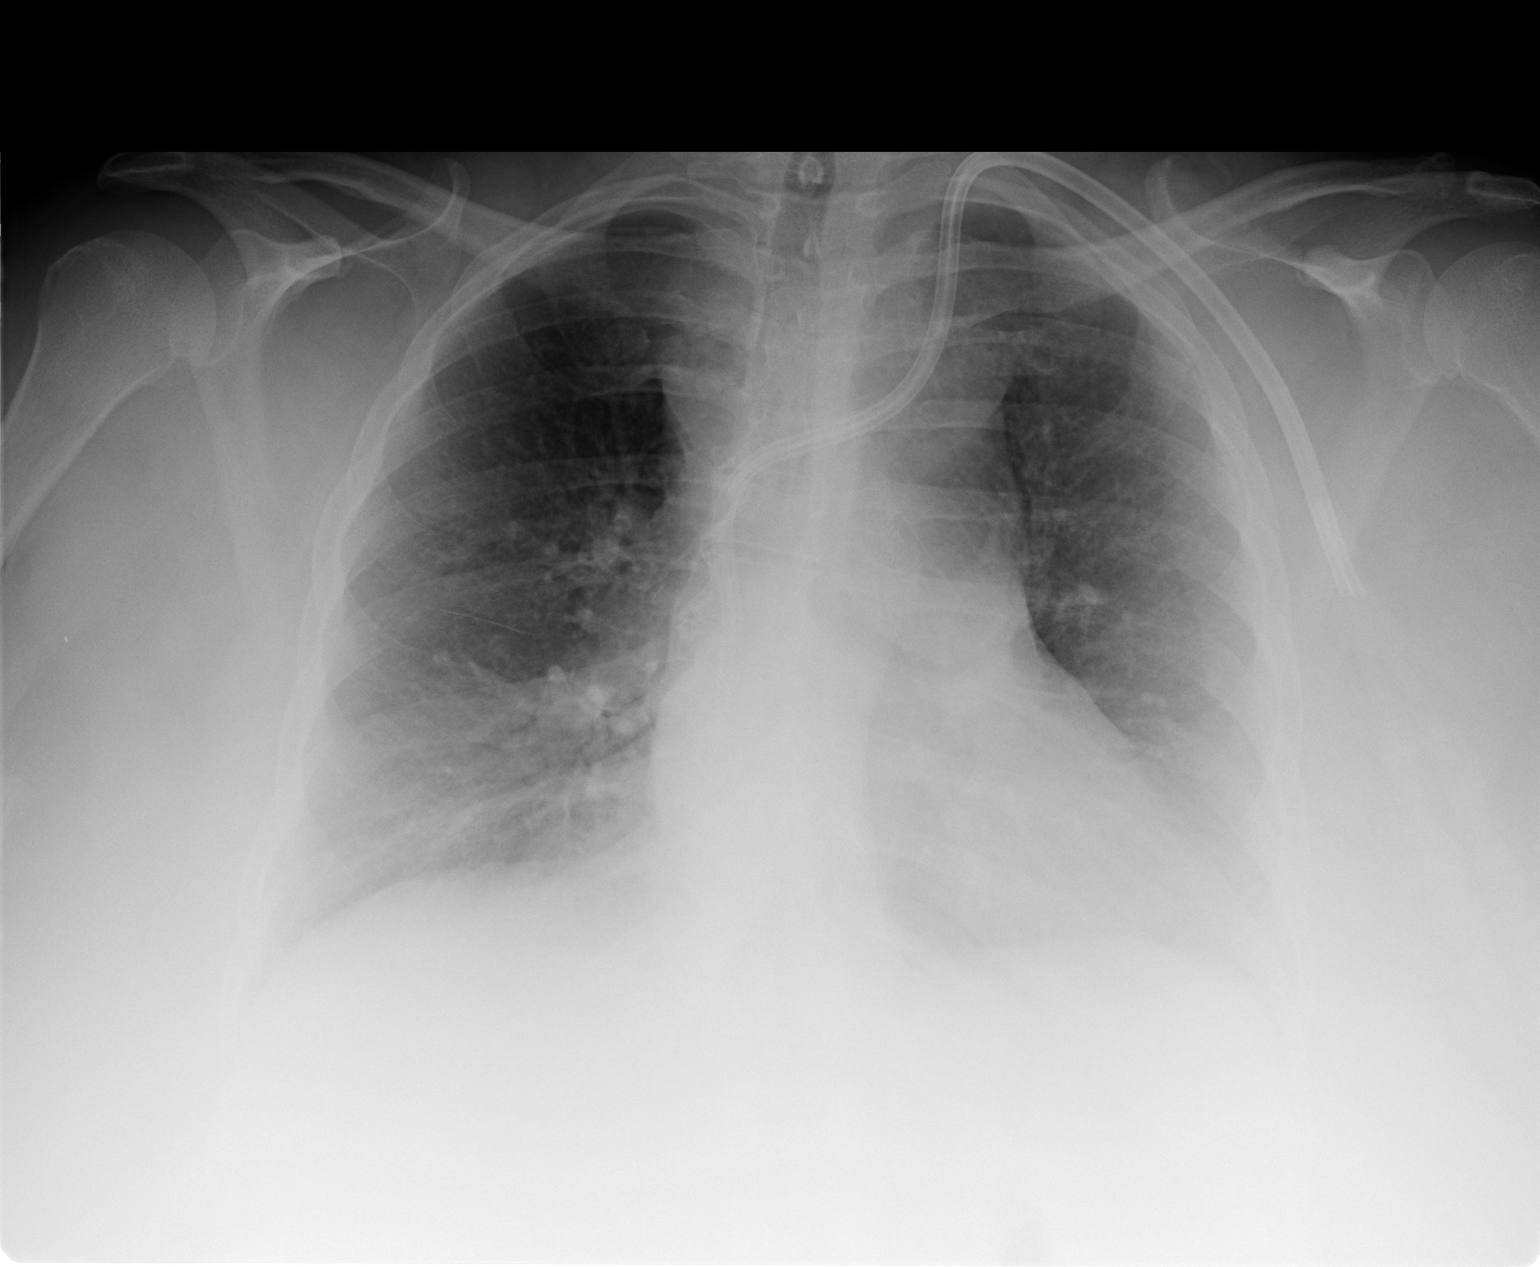

[view not recorded (2 of 2)]
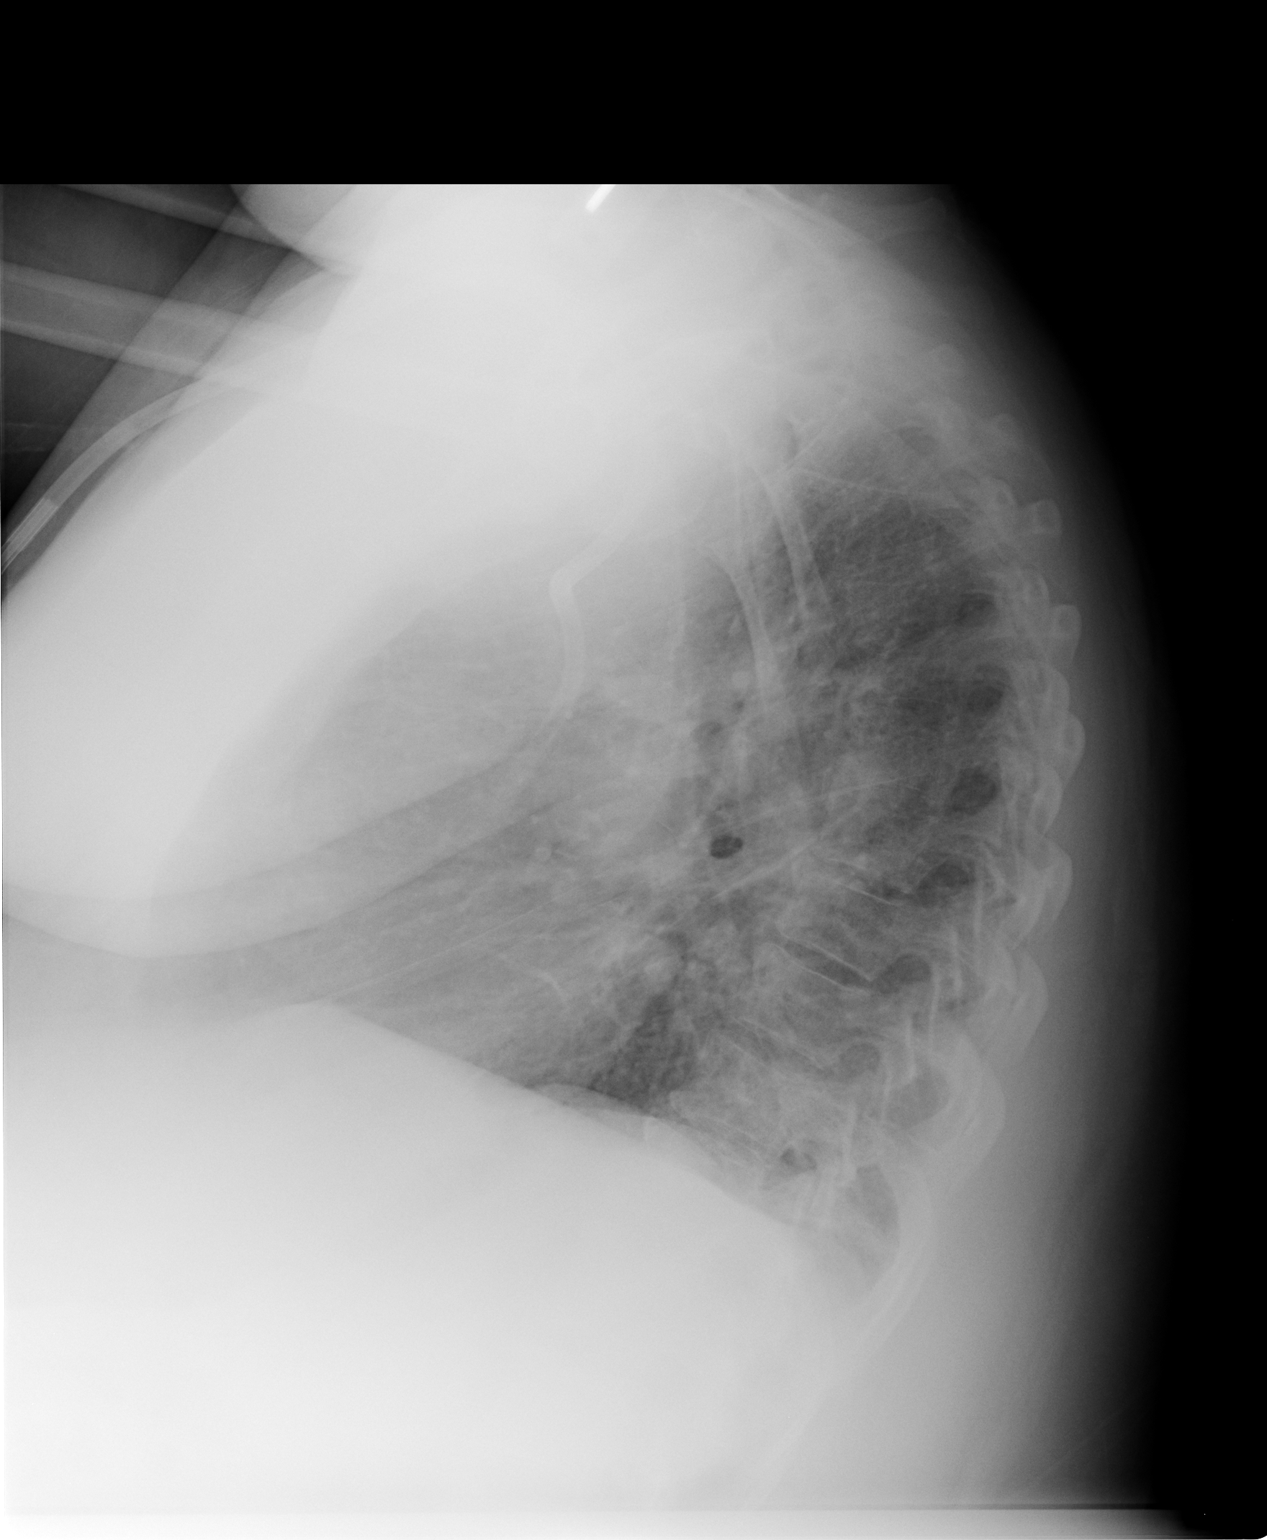

[2 of 2 positions shown; findings below may reference images not displayed]

The heart size is normal. The mediastinum is unremarkable. The dual lumen catheter has its tip at the SVC/RA junction. The lungs are clear except for some chronically increased interstitial markings. No effusions.  Bony structures are unremarkable.
IMPRESSION: No active disease.

## 2006-11-13 ENCOUNTER — Ambulatory Visit (HOSPITAL_COMMUNITY): Admission: RE | Admit: 2006-11-13 | Discharge: 2006-11-13 | Payer: Self-pay | Admitting: Nephrology

## 2006-11-13 DIAGNOSIS — K219 Gastro-esophageal reflux disease without esophagitis: Secondary | ICD-10-CM | POA: Insufficient documentation

## 2006-11-13 DIAGNOSIS — T82898D Other specified complication of vascular prosthetic devices, implants and grafts, subsequent encounter: Secondary | ICD-10-CM | POA: Insufficient documentation

## 2006-11-13 IMAGING — CR DG CHEST 1V PORT
2 series · 2 of 2 positions shown · non-contrast
Comparison: none

CLINICAL DATA: Dialysis catheter exchange.
 PORTABLE CHEST - 1 VIEW - 07/02/05:

[view not recorded (1 of 2)]
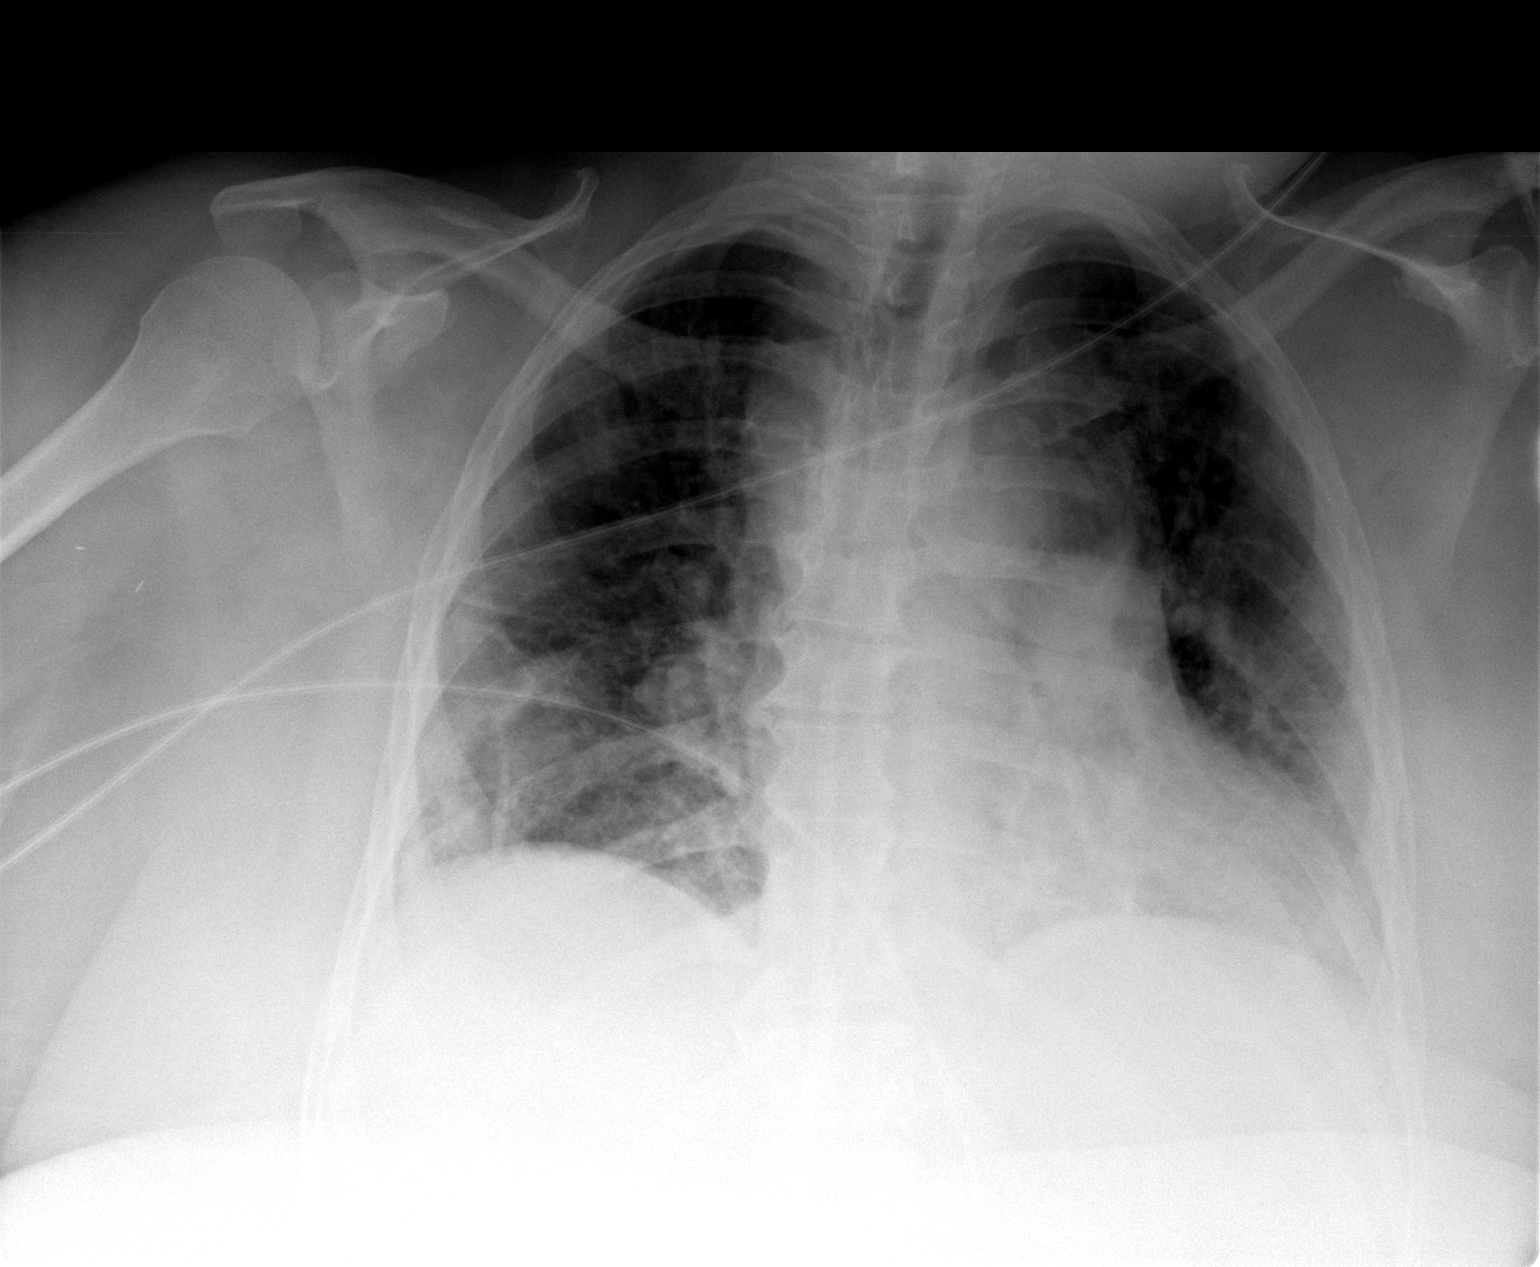

[view not recorded (2 of 2)]
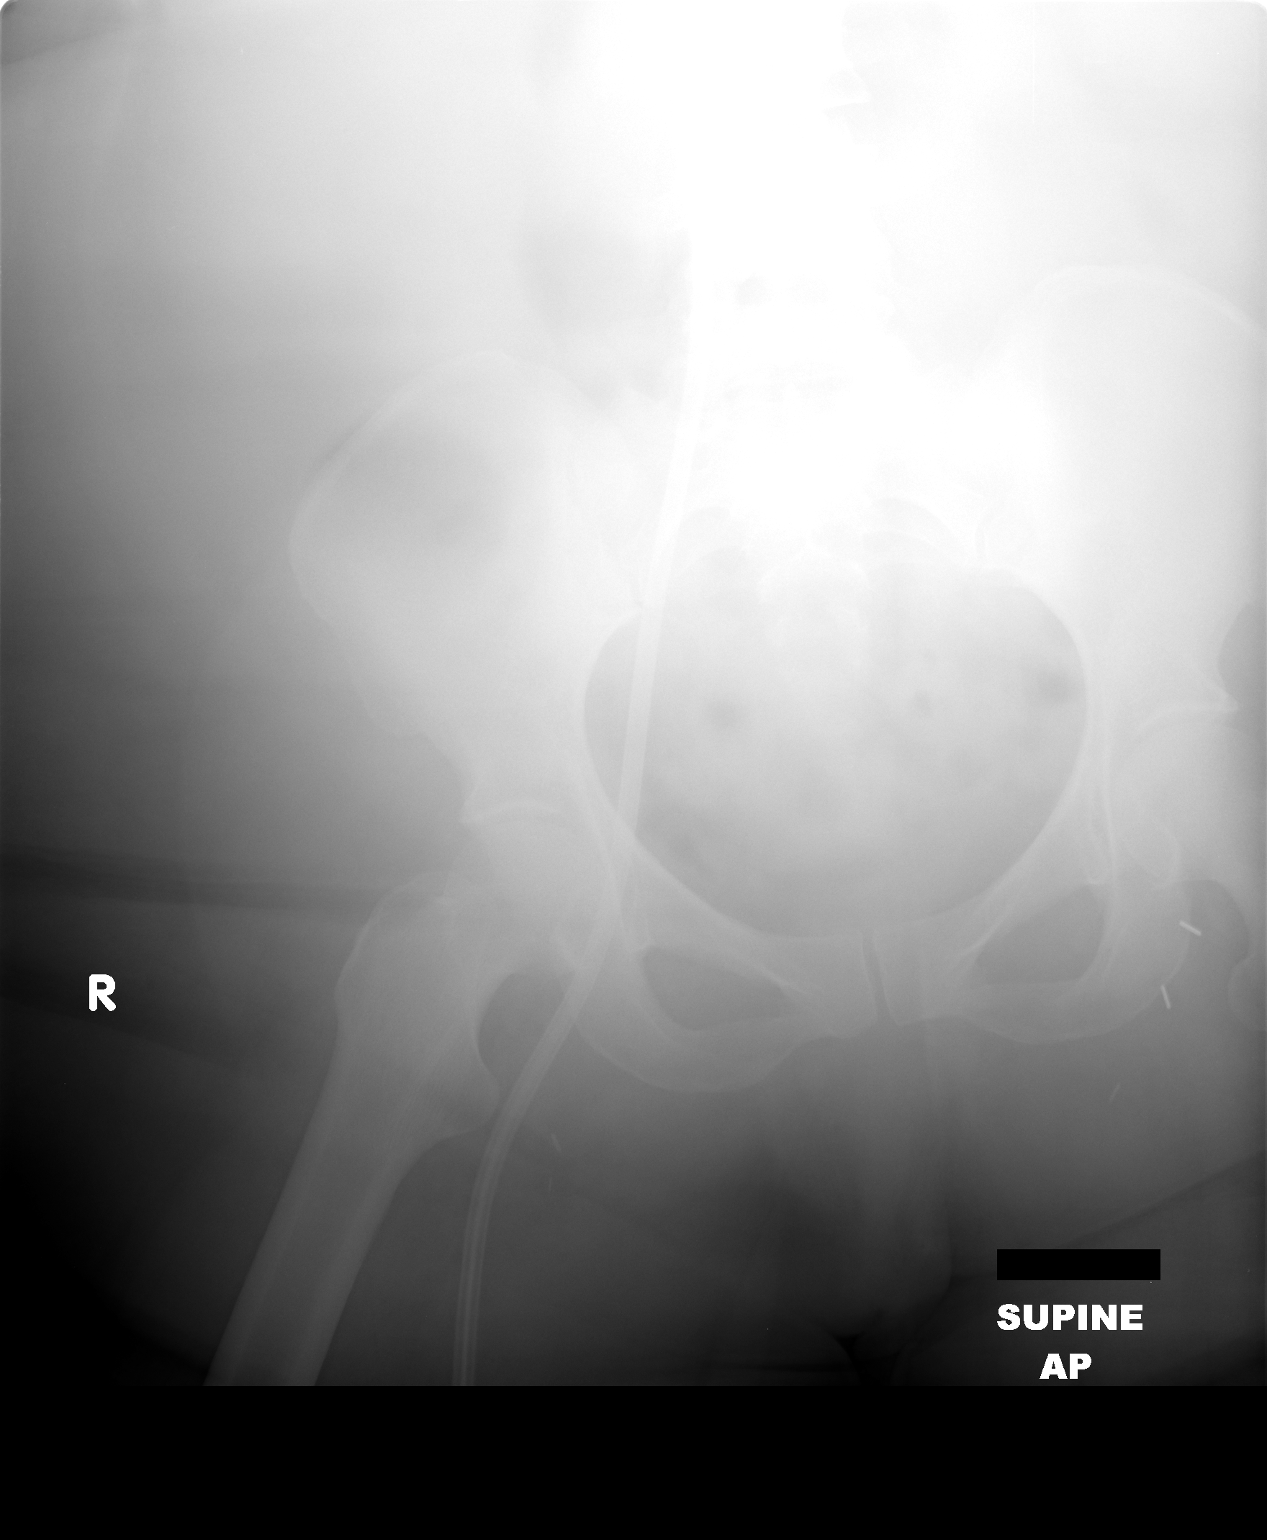

[2 of 2 positions shown; findings below may reference images not displayed]

FINDINGS: The patient has a right common femoral approach Diatek catheter in place.  Both tips lie within the right atrium.  There is pulmonary edema.  An abdominal view was also obtained in error.  This shows the right femoral catheter and is otherwise unremarkable.
IMPRESSION: 1.  Diatek catheter in good position.
 2.  Pulmonary edema.

## 2006-11-27 ENCOUNTER — Inpatient Hospital Stay (HOSPITAL_COMMUNITY): Admission: EM | Admit: 2006-11-27 | Discharge: 2006-11-30 | Payer: Self-pay | Admitting: Emergency Medicine

## 2006-12-20 ENCOUNTER — Ambulatory Visit (HOSPITAL_COMMUNITY): Admission: RE | Admit: 2006-12-20 | Discharge: 2006-12-20 | Payer: Self-pay | Admitting: Nephrology

## 2006-12-22 ENCOUNTER — Inpatient Hospital Stay (HOSPITAL_COMMUNITY): Admission: EM | Admit: 2006-12-22 | Discharge: 2006-12-27 | Payer: Self-pay | Admitting: Emergency Medicine

## 2007-01-10 IMAGING — CR DG CHEST 2V
2 series · 2 of 2 positions shown · non-contrast
Comparison: 06/22/05.

CLINICAL DATA: Fever.  Hypotension.
 CHEST - 2 VIEW:

[w chest pa]
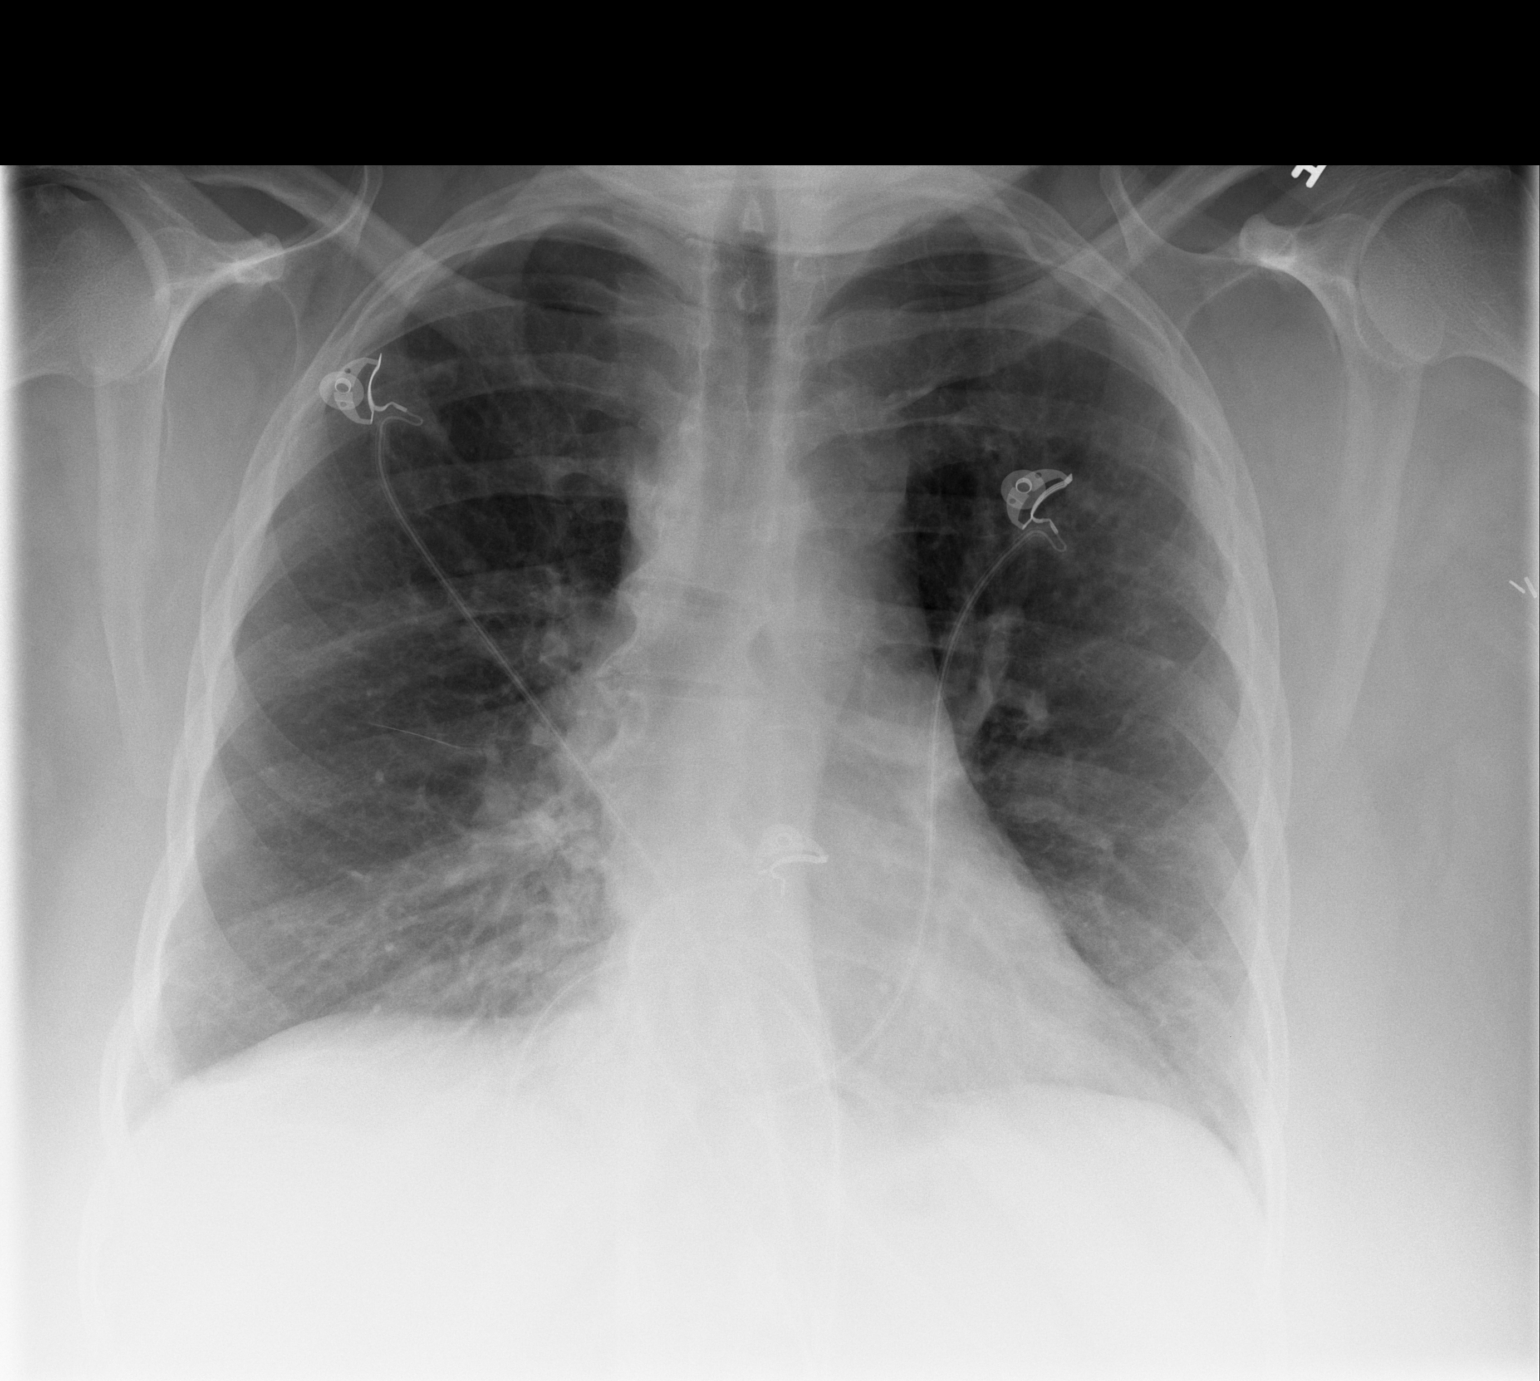

[w chest lat]
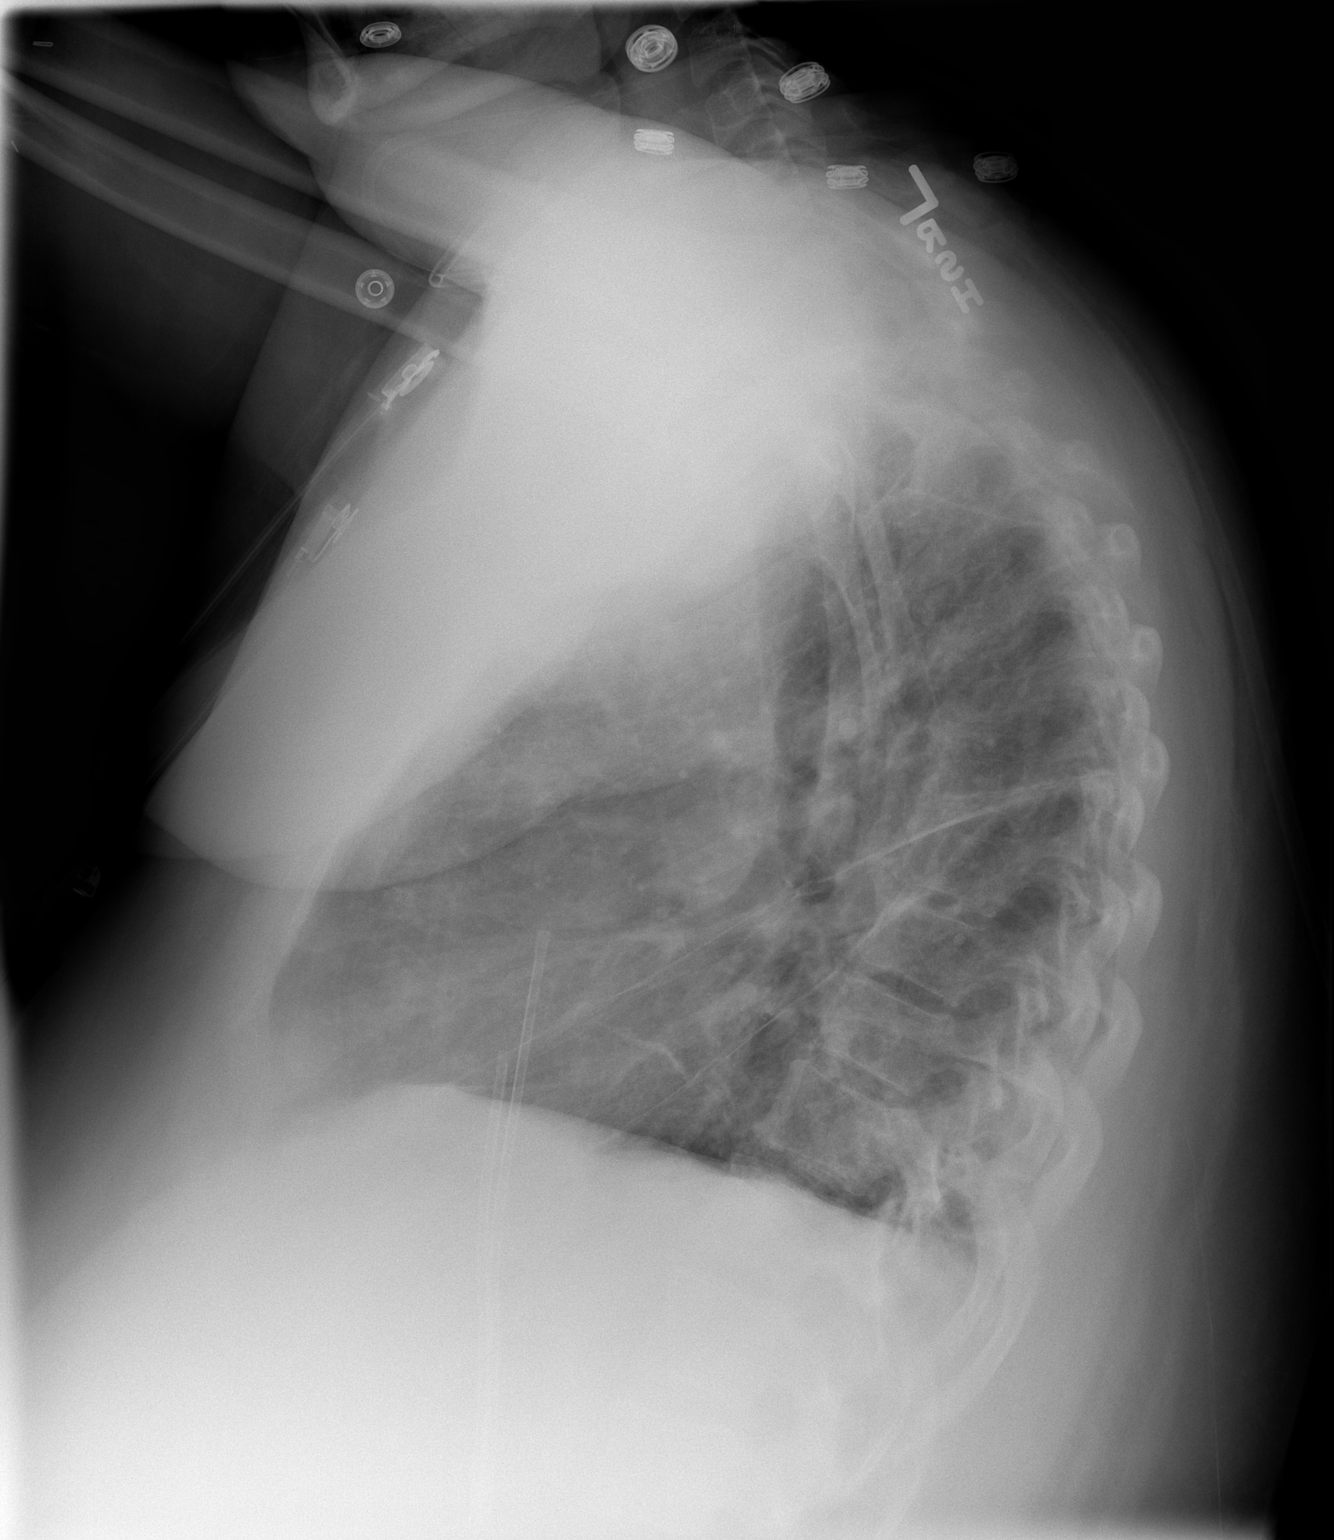

[2 of 2 positions shown; findings below may reference images not displayed]

Heart size is normal.   There is no heart failure.  There is COPD and chronic lung disease.  There is mild bibasilar atelectasis or scarring.  Central venous catheter from the femoral approach has both tips in the region of the right atrium.
IMPRESSION: Chronic lung disease without edema or effusion.

## 2007-02-23 IMAGING — CR DG CHEST 1V PORT
1 series · 1 of 1 positions shown · non-contrast
Comparison: 08/29/05.

CLINICAL DATA: Renal failure/Diatek catheter placement.
PORTABLE CHEST - 1 VIEW - 10/12/05:

[view not recorded]
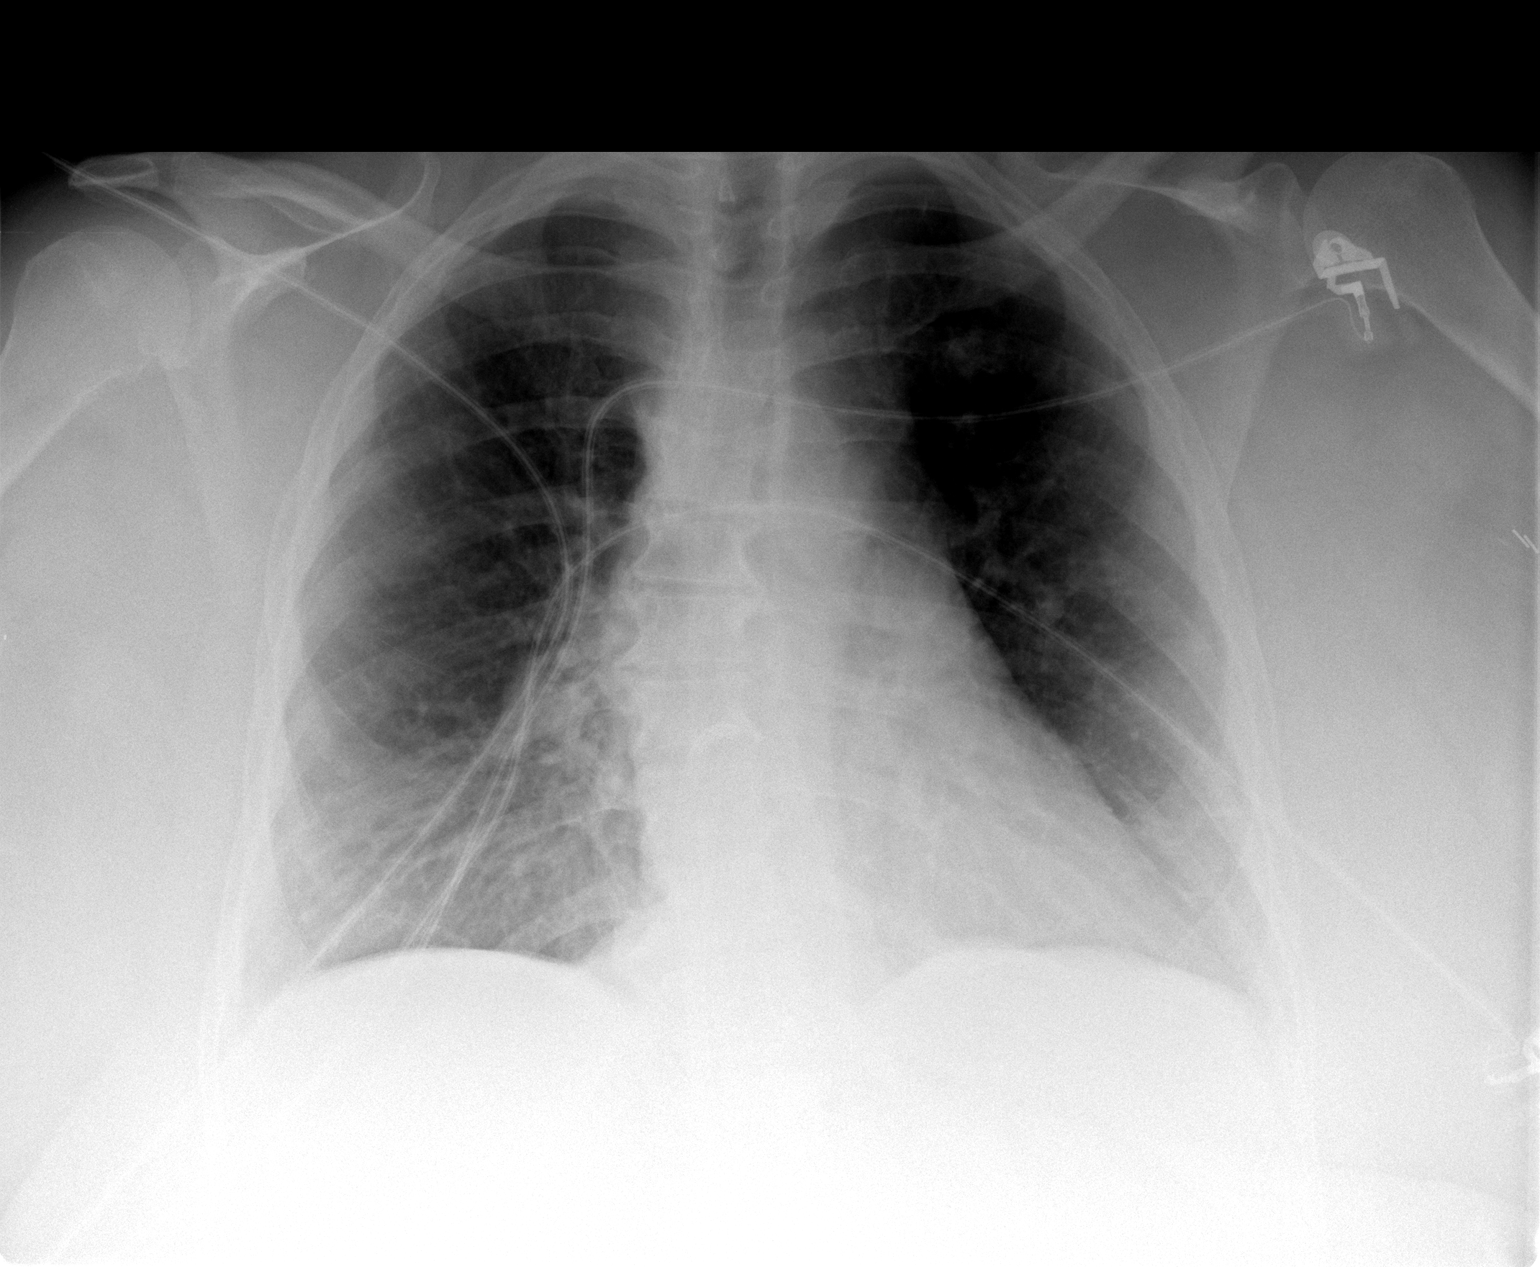

[1 of 1 positions shown; findings below may reference images not displayed]

FINDINGS: A Diatek catheter has been placed from the femoral approach.  The long tip of the catheter appears to be in the upper right atrium.  The distal aspect of the catheter appears to be curved medially.  This may be an artifact.  Recommend cone-down view of this area for further assessment.  No congestive heart failure or active disease.
IMPRESSION: Diatek catheter placement from the femoral approach to the right atrium ? the distal aspect of the long lumen appears to be curved in its orientation.  Recommend cone-down view of this area for further assessment.

## 2007-03-12 IMAGING — CR DG CHEST 1V PORT
1 series · 1 of 1 positions shown · non-contrast
Comparison: 2012 hours.

CLINICAL DATA: Central line placement. Shortness of breath, sepsis. 
 PORTABLE CHEST ? 10/29/05 ([DATE] HOURS):

[view not recorded]
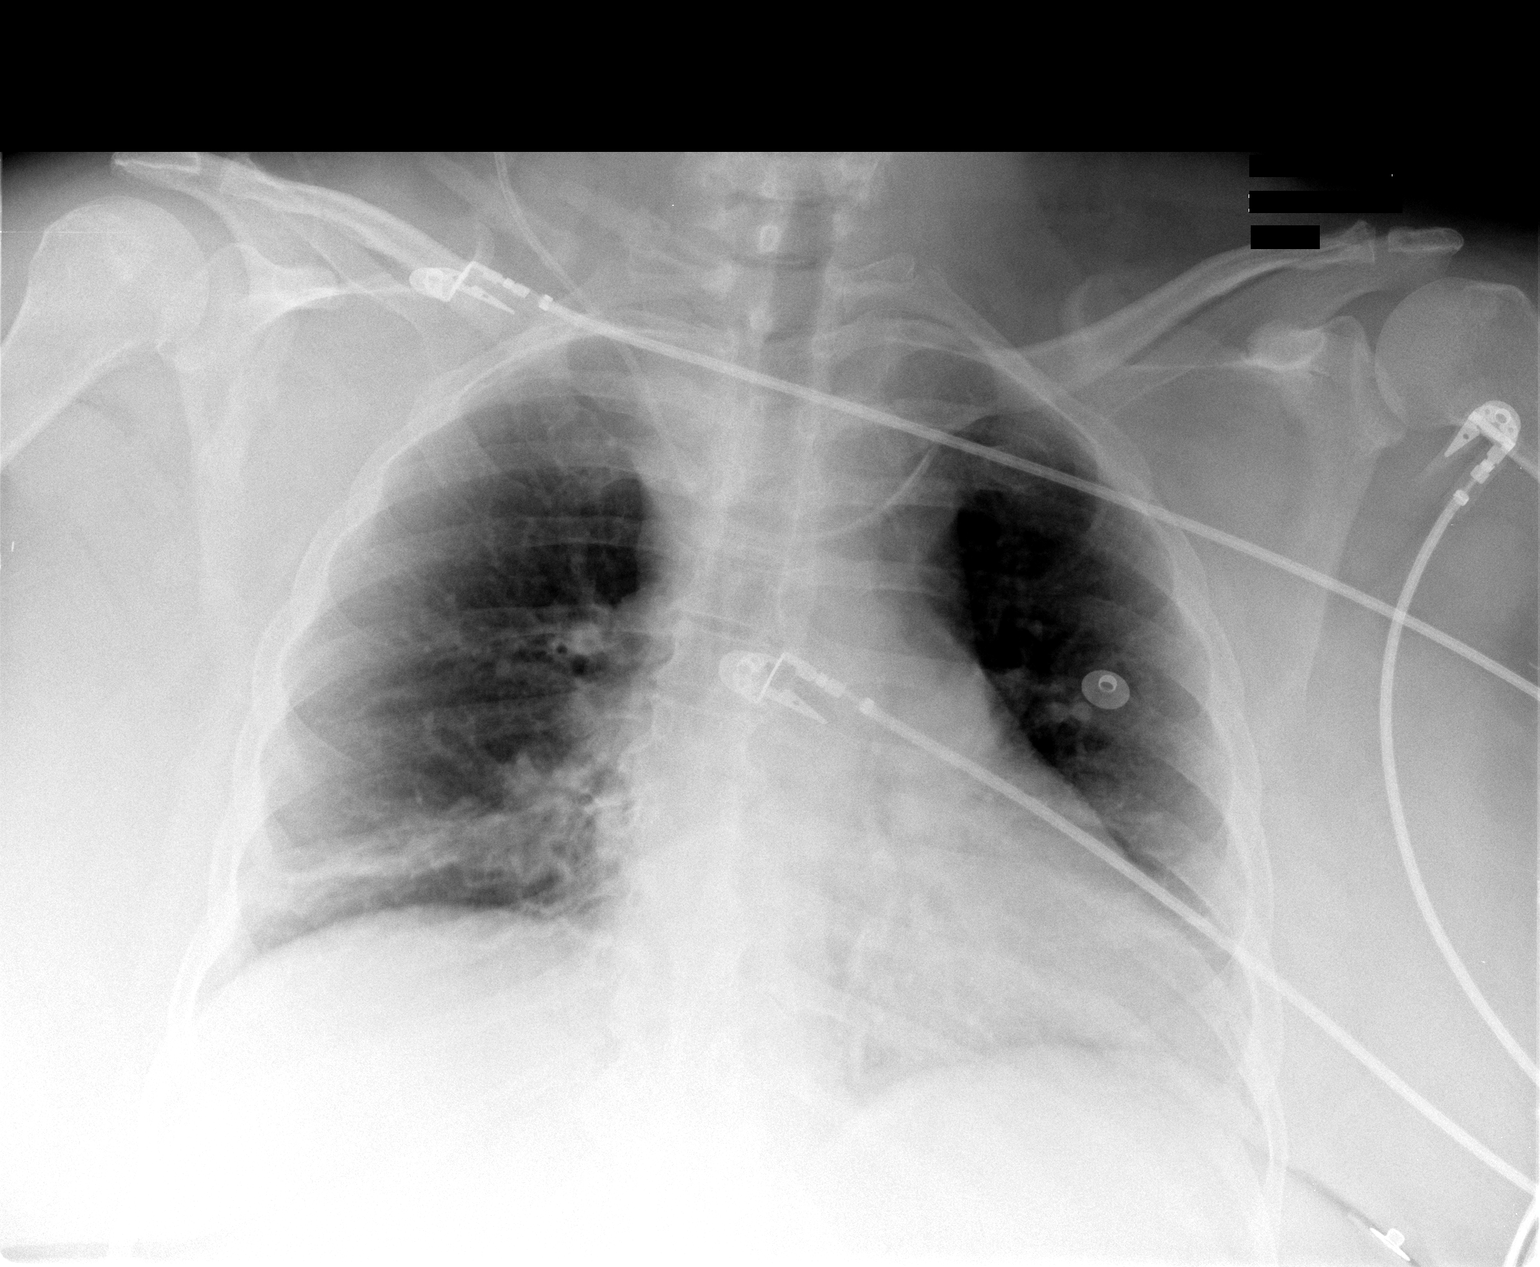

[1 of 1 positions shown; findings below may reference images not displayed]

FINDINGS: Right internal jugular central line crosses the midline and terminates along the left side of the mediastinum oriented cephalad.  This could be within the left internal jugular vein or in the upper margin of the left subclavian vein.   Low lung volumes are present.  There is right basilar atelectasis opacity consistent with atelectasis or pneumonia.
IMPRESSION: 1.  Central line crosses the midline and its tip is either in the proximal aspect of the left internal jugular vein or in the left subclavian vein. 
 2.  Right basilar airspace opacity.

## 2007-03-12 IMAGING — CR DG CHEST 1V PORT
1 series · 1 of 1 positions shown · non-contrast
Comparison: 10/12/2005

CLINICAL DATA: Respiratory distress, shortness of breath

PORTABLE CHEST - 1 VIEW:

[view not recorded]
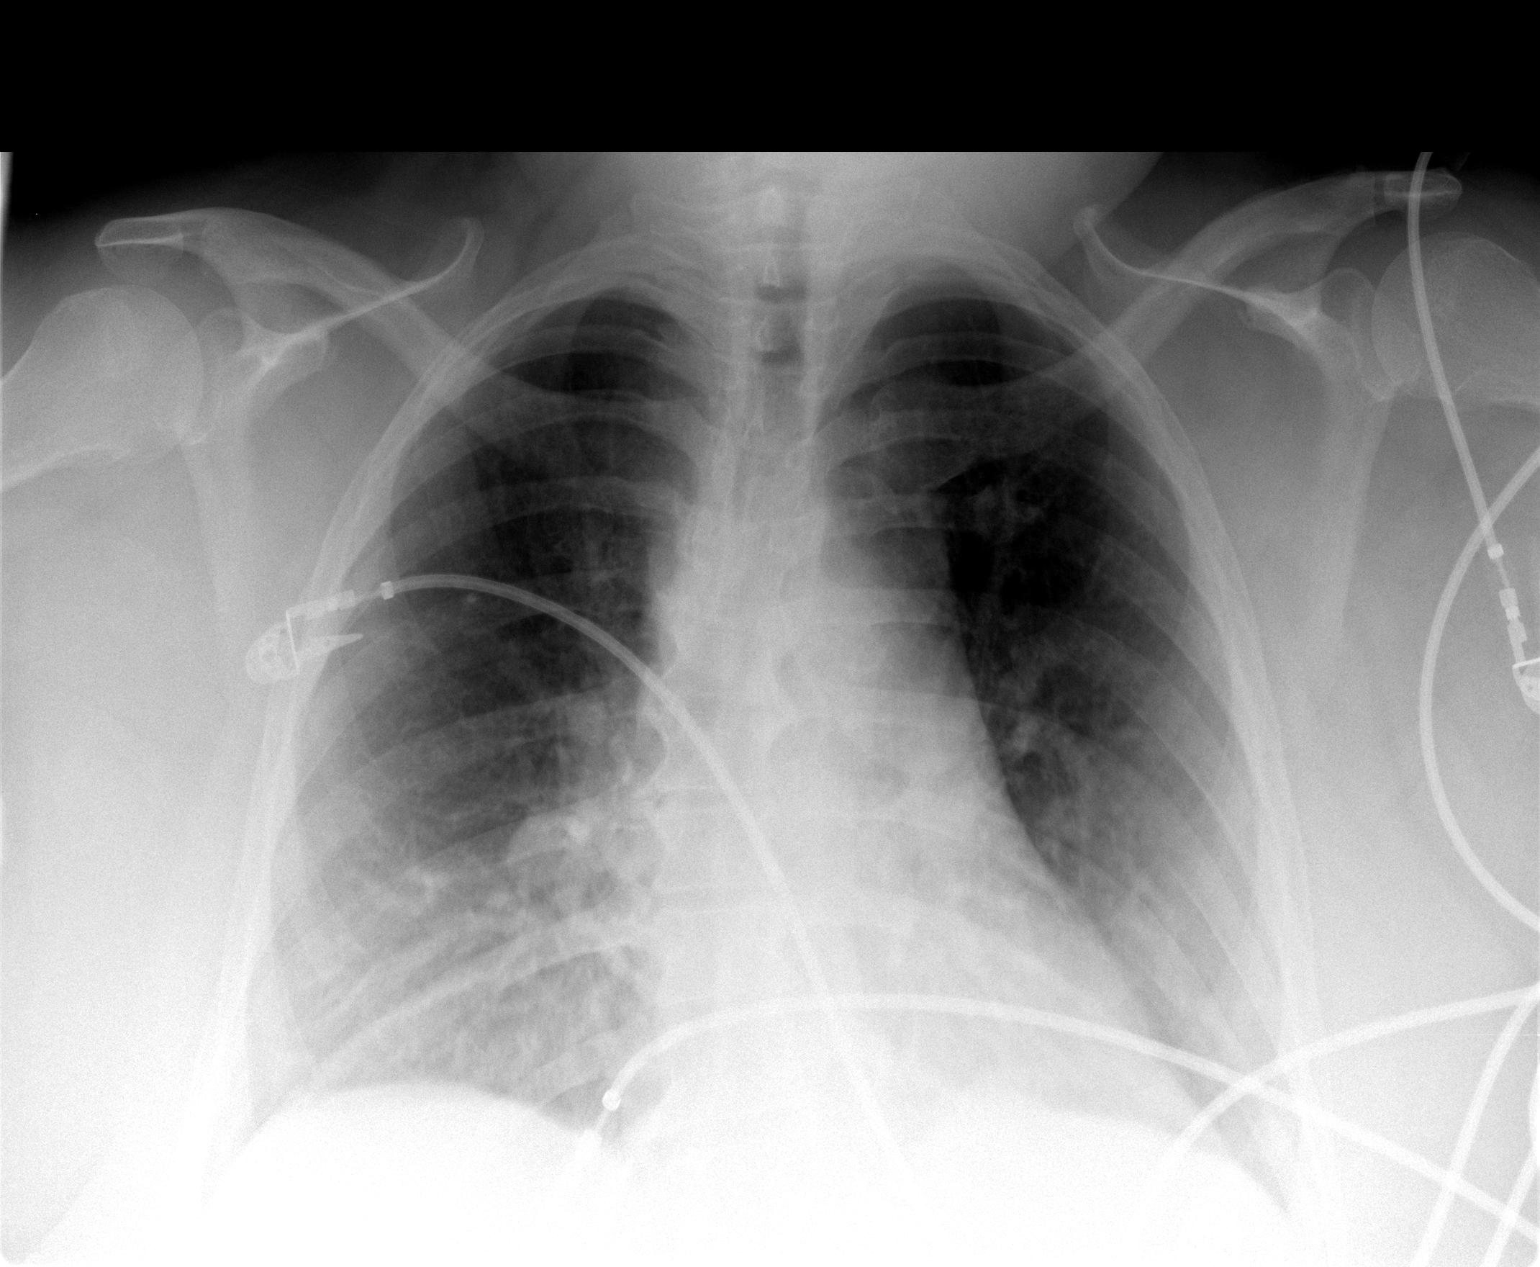

[1 of 1 positions shown; findings below may reference images not displayed]

FINDINGS: Heart and mediastinal contours are within normal limits. Mild
vascular congestion and bibasilar atelectasis. No significant change since prior
study.
IMPRESSION: Mild vascular congestion, bibasilar atelectasis.

## 2007-03-13 IMAGING — CT CT ANGIO CHEST
2 of 3 series · 19 of 36 positions shown · IV contrast (APPLIED)
Comparison: none

CLINICAL DATA: Short of breath.  Fever.  
 CT ANGIOGRAPHY OF CHEST:
TECHNIQUE: Multidetector CT imaging of the chest was performed during bolus injection of intravenous contrast.  Multiplanar CT angiographic image reconstructions were generated to evaluate the vascular anatomy.
 Contrast:  120 cc Omnipaque 300

[Series 5: pulm embolism 2.0 b31f st · axial · 0.60mm/px · z∈[-302,-20]mm · 16 of 153 slices shown]
[im 6/153  lung]
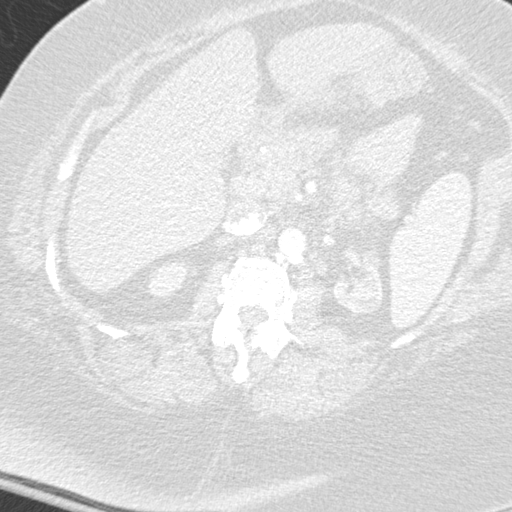
[im 17/153  mediastinal]
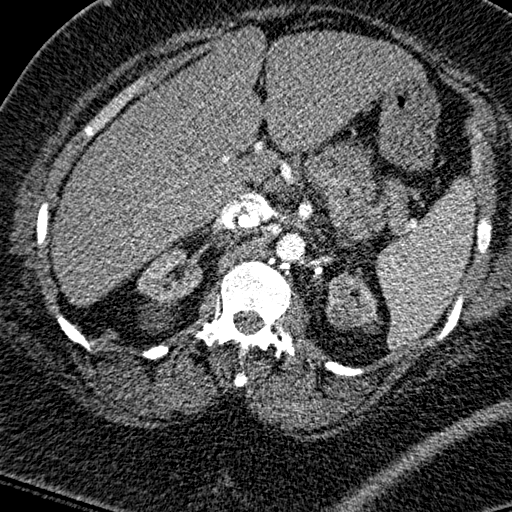
[im 29/153  lung]
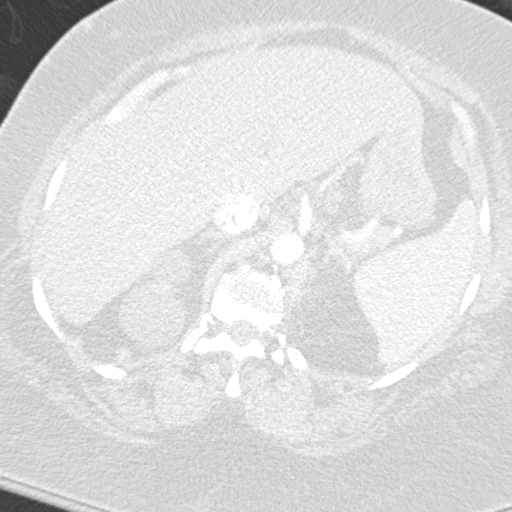
[im 34/153  mediastinal]
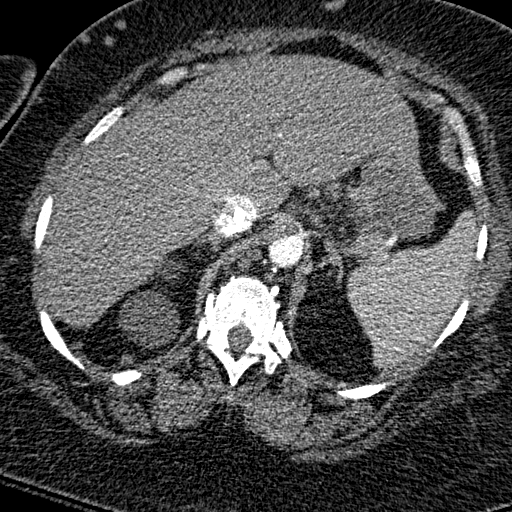
[im 46/153  lung]
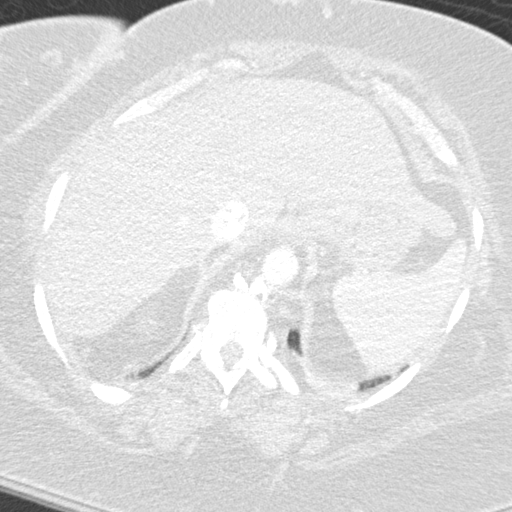
[im 51/153  mediastinal]
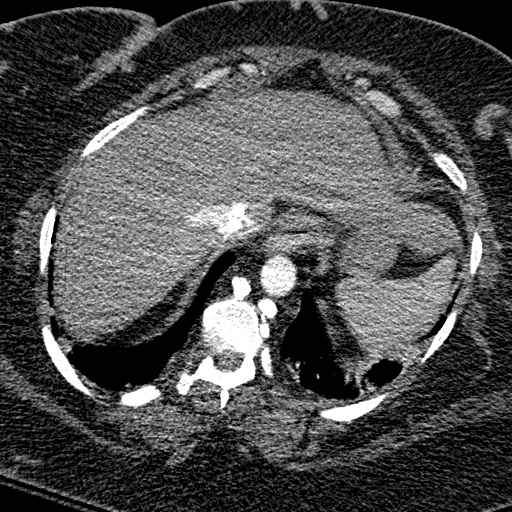
[im 62/153  lung]
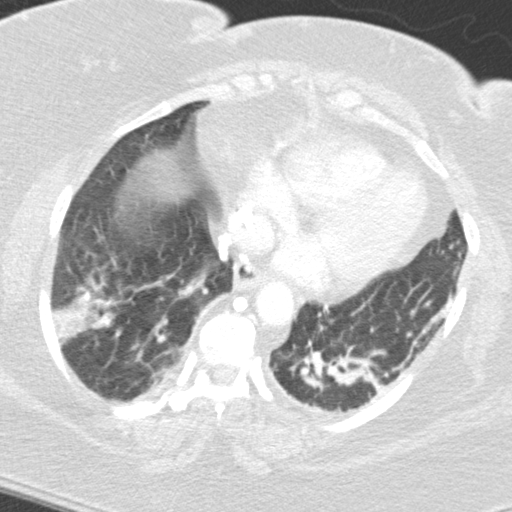
[im 74/153  mediastinal]
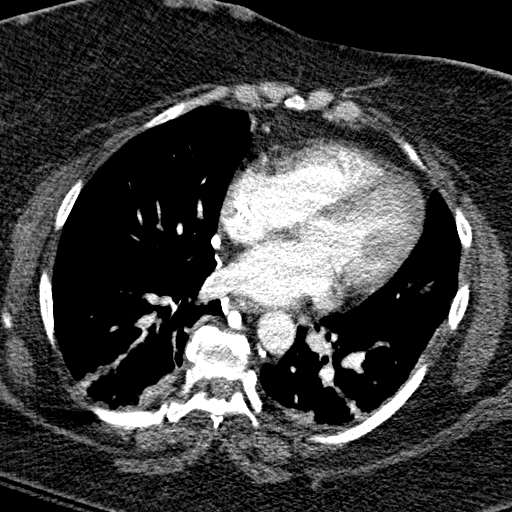
[im 79/153  lung]
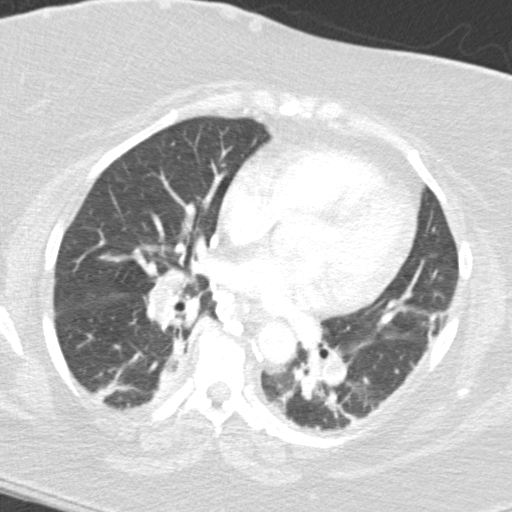
[im 91/153  mediastinal]
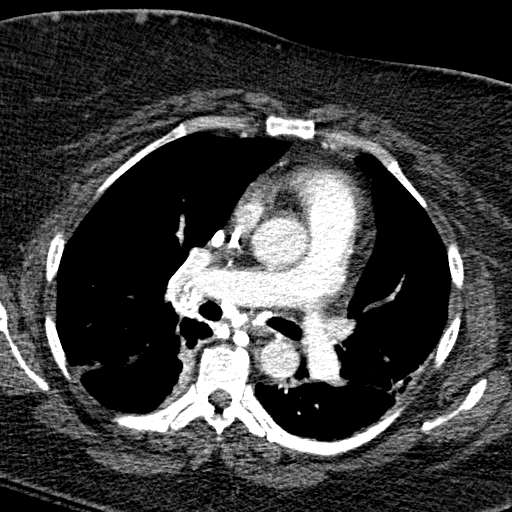
[im 102/153  lung]
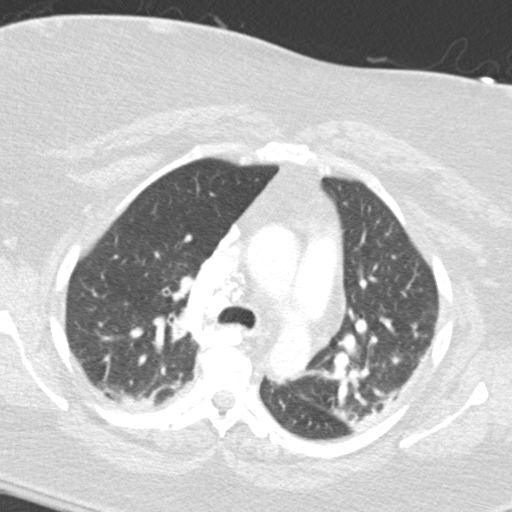
[im 107/153  mediastinal]
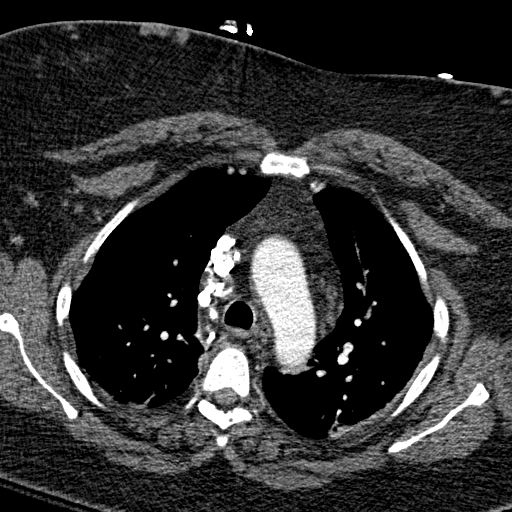
[im 119/153  lung]
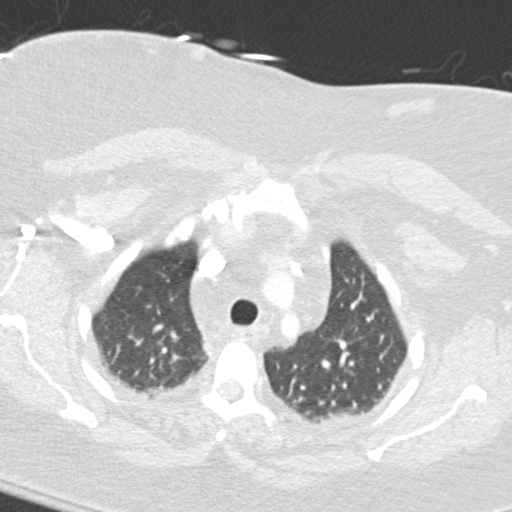
[im 124/153  mediastinal]
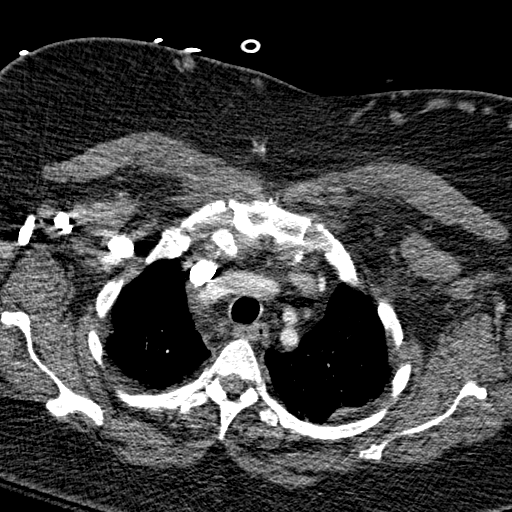
[im 136/153  lung]
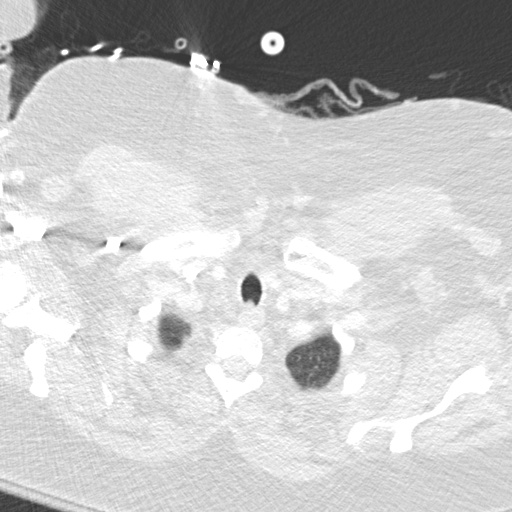
[im 147/153  mediastinal]
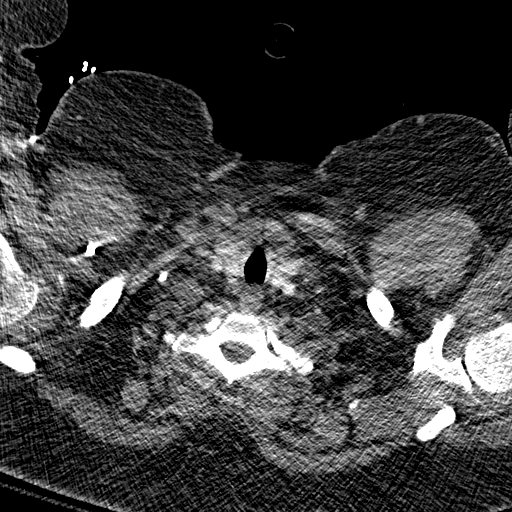

[Series 603: coronal pe · coronal · 0.60mm/px · 3 of 77 slices shown]
[im 16/77  mediastinal]
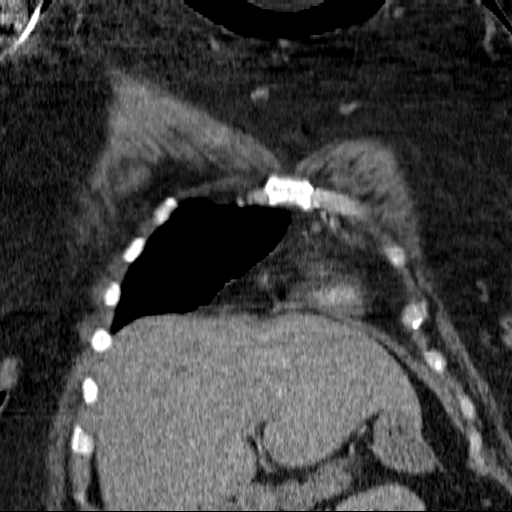
[im 31/77  mediastinal]
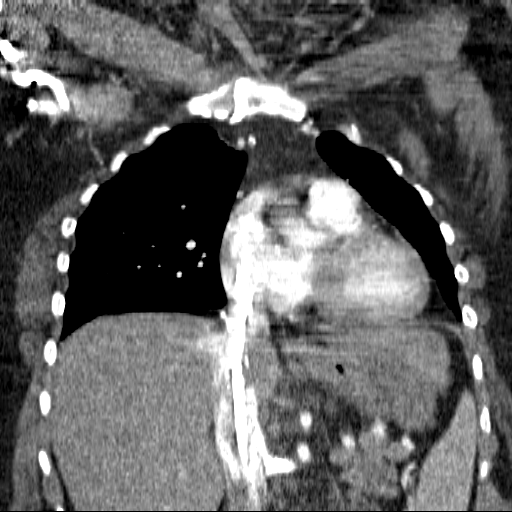
[im 46/77  mediastinal]
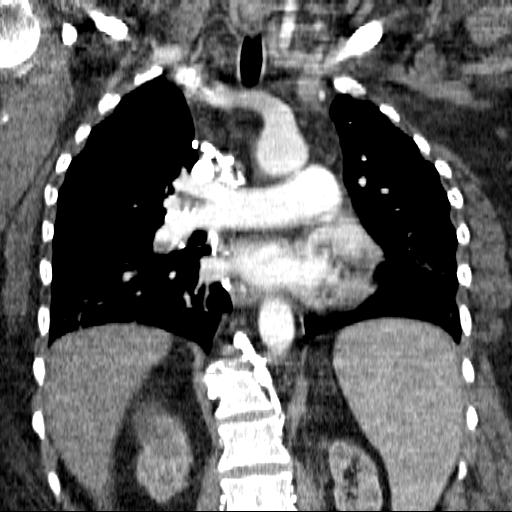

[19 of 36 positions shown; findings below may reference images not displayed]

FINDINGS: Patient was pre-medicated with a 23-hour prep due to prior allergy to contrast.  
 There are bilateral pulmonary emboli which are moderately large.  
 There is bibasilar atelectasis.  No significant effusion is noted.  There is no adenopathy.  There is occlusion of the superior vena cava with numerous azygous vein collaterals present extending below the diaphragm.
IMPRESSION: 1.  The study is positive for bilateral pulmonary emboli.  
 2.  Bibasilar atelectasis.  
 3.  Superior vena cava obstruction with multiple venous collaterals.

## 2007-03-13 IMAGING — CR DG CHEST 1V PORT
1 series · 1 of 1 positions shown · non-contrast
Comparison: none

CLINICAL DATA: Fever, sepsis.  Leukocytosis.
 PORTABLE CHEST - 1 VIEW ? 10/30/05 ? 4324 HOURS:
 Comparison to yesterday?s exam.

[view not recorded]
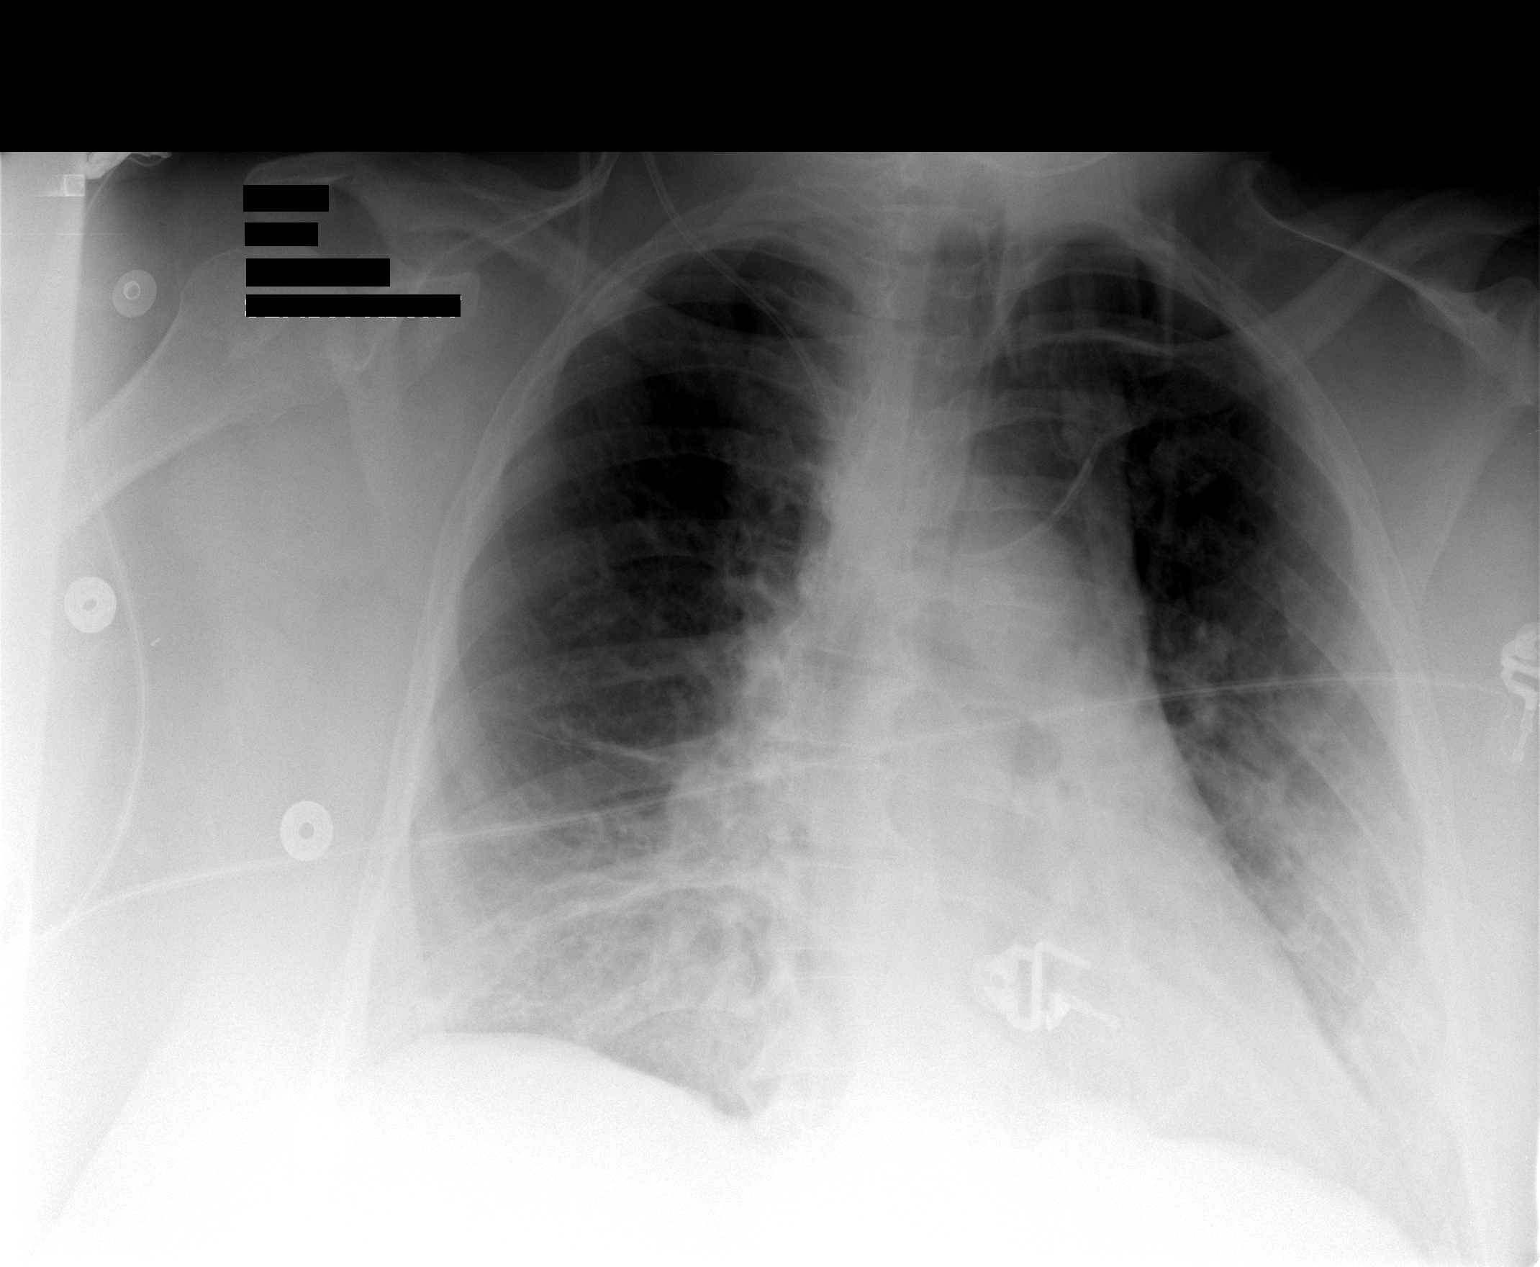

[1 of 1 positions shown; findings below may reference images not displayed]

FINDINGS: Right jugular central venous catheter is again noted crossing the midline with the tip in the region of the confluence of the left subclavian and internal jugular veins.  Pulmonary vascular congestion and perivascular edema.  Mild atelectasis at the lung bases.
IMPRESSION: Again suboptimal position of the right IJ central venous catheter.  Pulmonary vascular congestion and mild pulmonary edema.

## 2007-03-14 IMAGING — XA IR CV CATH FLUORO GUIDE
1 series · 12 of 20 positions shown · non-contrast
Comparison: none

CLINICAL DATA: Infected right femoral hemodialysis catheter.

[Series 1: run · 12 of 20 slices shown]
[im 1/20]
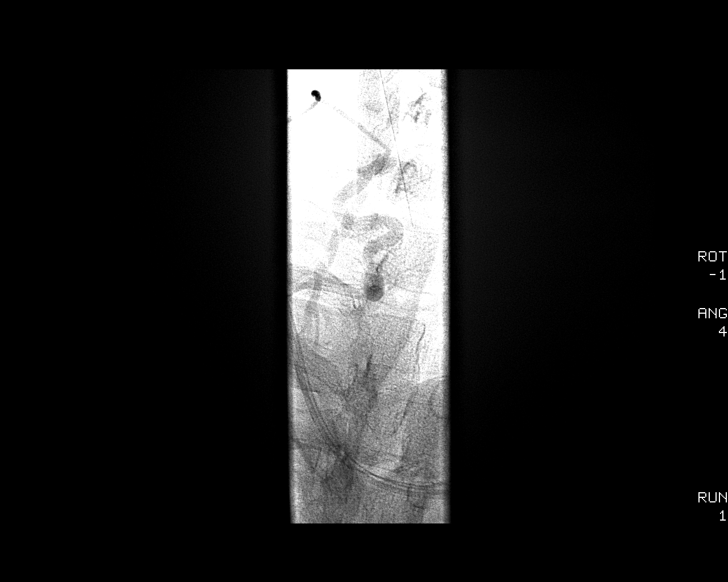
[im 3/20]
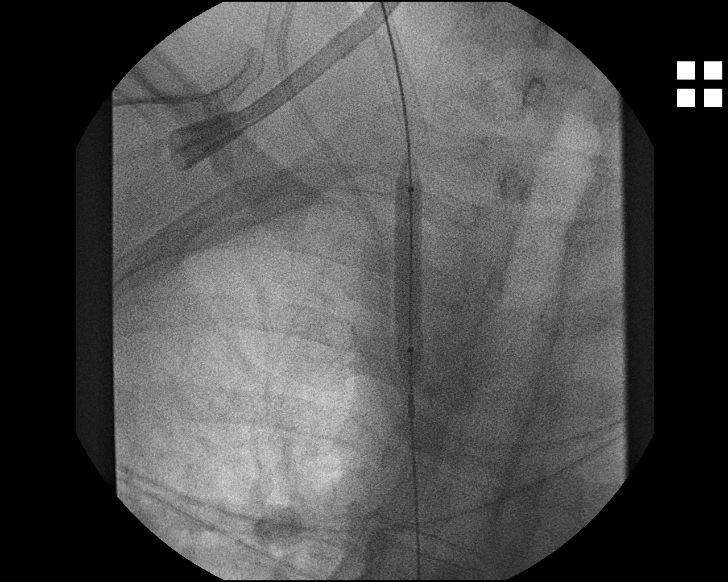
[im 5/20]
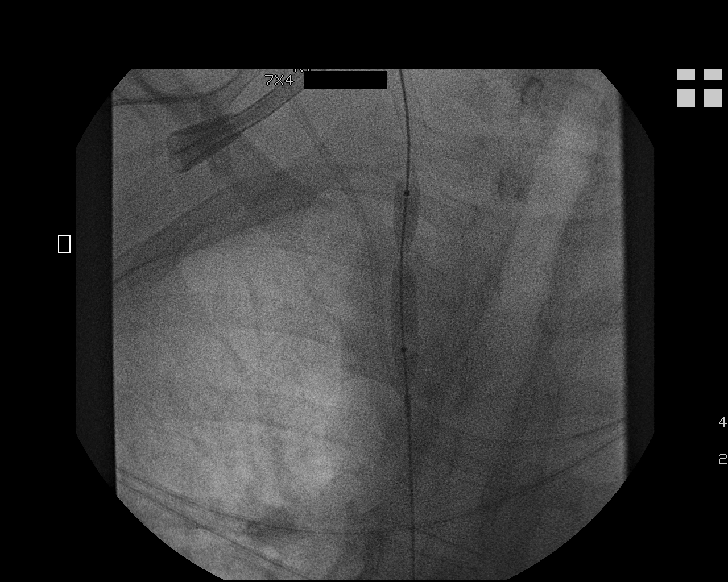
[im 6/20]
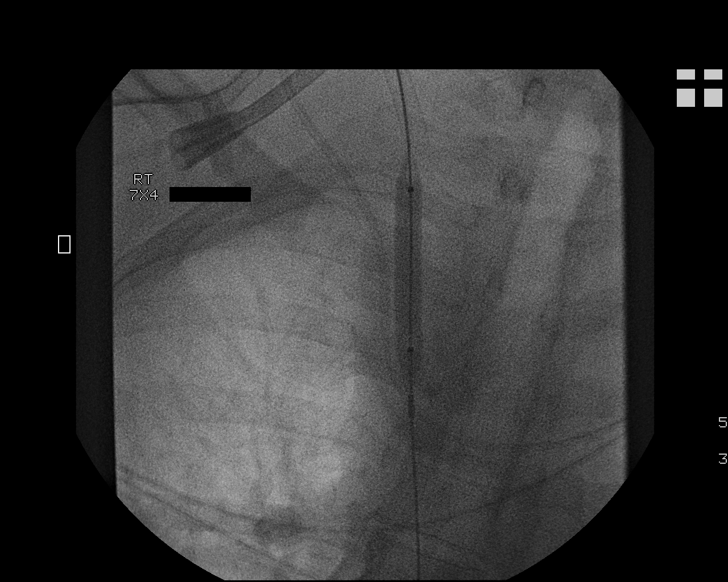
[im 8/20]
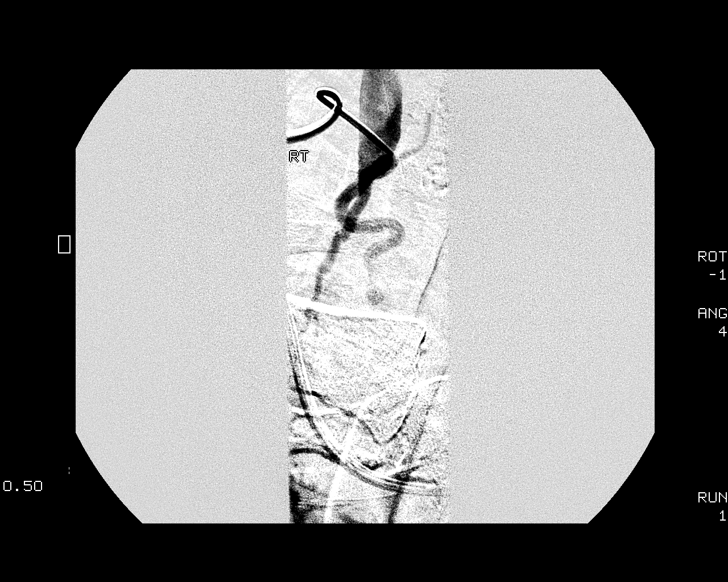
[im 10/20]
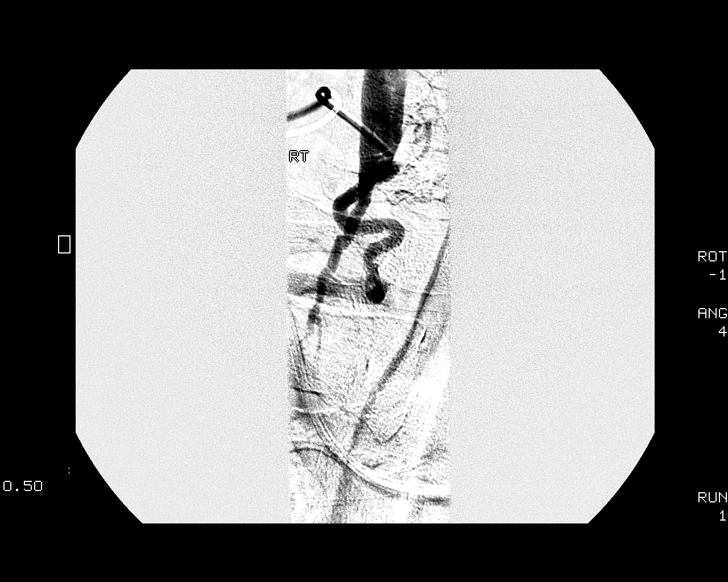
[im 11/20]
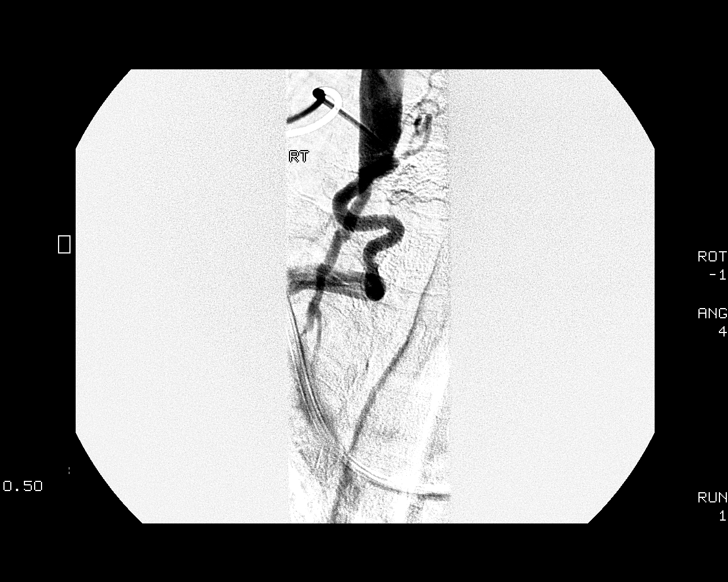
[im 13/20]
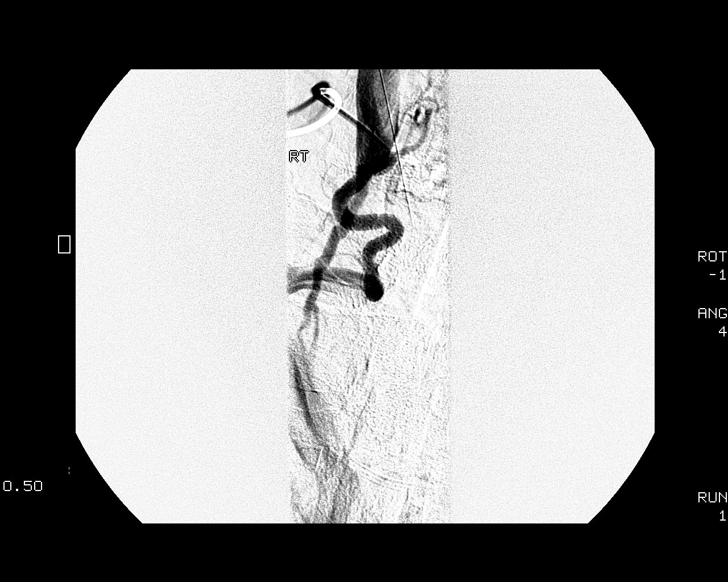
[im 15/20]
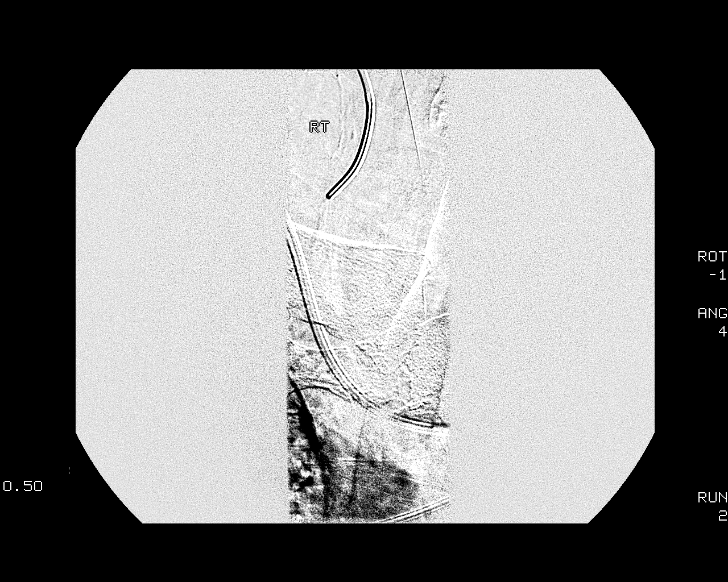
[im 16/20]
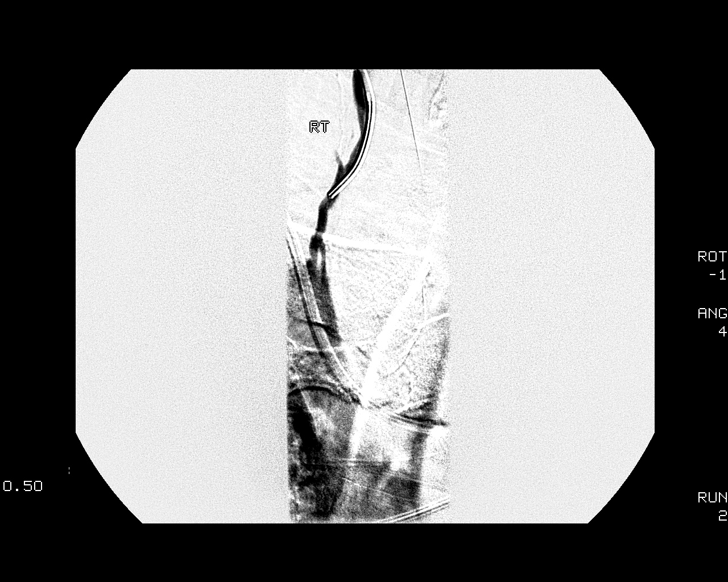
[im 18/20]
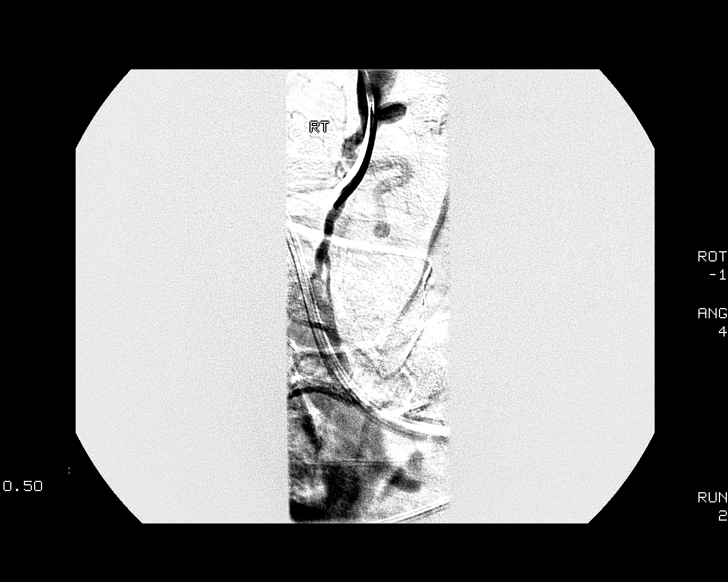
[im 20/20]
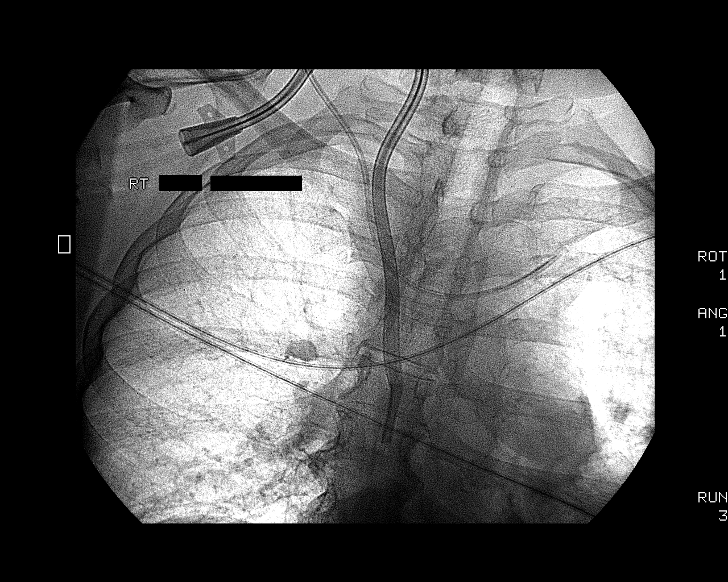

[12 of 20 positions shown; findings below may reference images not displayed]

Tunneled hemodialysis catheter placement with ultrasound and fluoroscopic
guidance:
Venous angioplasty:
Removal of femoral tunneled venous catheter:

Patency of the right IJ vein was confirmed with ultrasound. The previously
placed triple lumen catheter was noted to be in an external jugular collateral.
An appropriate skin site was determined. Skin site was marked, prepped with
Chloraprep and draped in usual sterile fashion, and infiltrated locally with 1%
lidocaine. Intravenous fentanyl and Versed were administered as conscious
sedation during continuous cardiorespiratory monitoring by the radiology RN.
Under real-time ultrasound guidance, the right IJ vein was accessed with a 21
gauge micropuncture needle. Needle exchanged over the 018 guidewire for
transitional dilator, which could not be advanced distally. Right IJ venogram
demonstrated central internal jugular vein occlusion with small irregular
channels over a short segment providing drainage into the right brachiocephalic
vein. The transitional dilator was upsized to allow advancement of a 035
Glidewire. Over this, a 5 French Kumpe catheter was advanced and access across
the occlusion was achieved. The Glidewire and Kumpe were advanced into the IVC
to allow exchange for an Amplatz superstiff wire.   A Hemosplit 23 hemodialysis
catheter was tunneled from the right anterior chest wall approach to the right
IJ dermatotomy site. The Kumpe catheter   was exchanged over the Amplatz wire
for serial vascular dilators which allow placement of a peel-away sheath,
through which the Conquest 7 mm angioplasty balloon was advanced to the level of
the central right IJ vein occlusion for venous angioplasty. Peel away sheath was
advanced on into the brachiocephalic vein. Through this, the catheter was
advanced, positioned with its tips in the proximal and midright atrium. Spot
chest radiograph confirms good catheter position. No pneumothorax. Catheter was
flushed and primed per protocol. Catheter secured externally with O Prolene
sutures. The right IJ   dermatotomy site was closed with 3-0 Monocryl
subcutaneous suture and covered with Dermabond. 
The skin surrounding the right femoral tunneled dialysis catheter was prepped
with chlorhexidine. The catheter was removed intact. Site was covered with a
sterile dressing.

The patient tolerated the procedure well, with no immediate complication.
IMPRESSION: 1. Technically successful placement of tunneled right IJ hemodialysis catheter
with ultrasound and fluoroscopic guidance. Ready for routine use.
2. Successful 7 mm balloon angioplasty of central right internal jugular vein
occlusion.
3. Technically successful removal of tunneled femoral catheter

## 2007-03-15 IMAGING — CR DG CHEST 1V PORT
1 series · 1 of 1 positions shown · non-contrast
Comparison: 10/31/2005

CLINICAL DATA: Sepsis

PORTABLE CHEST - 1 VIEW:

[view not recorded]
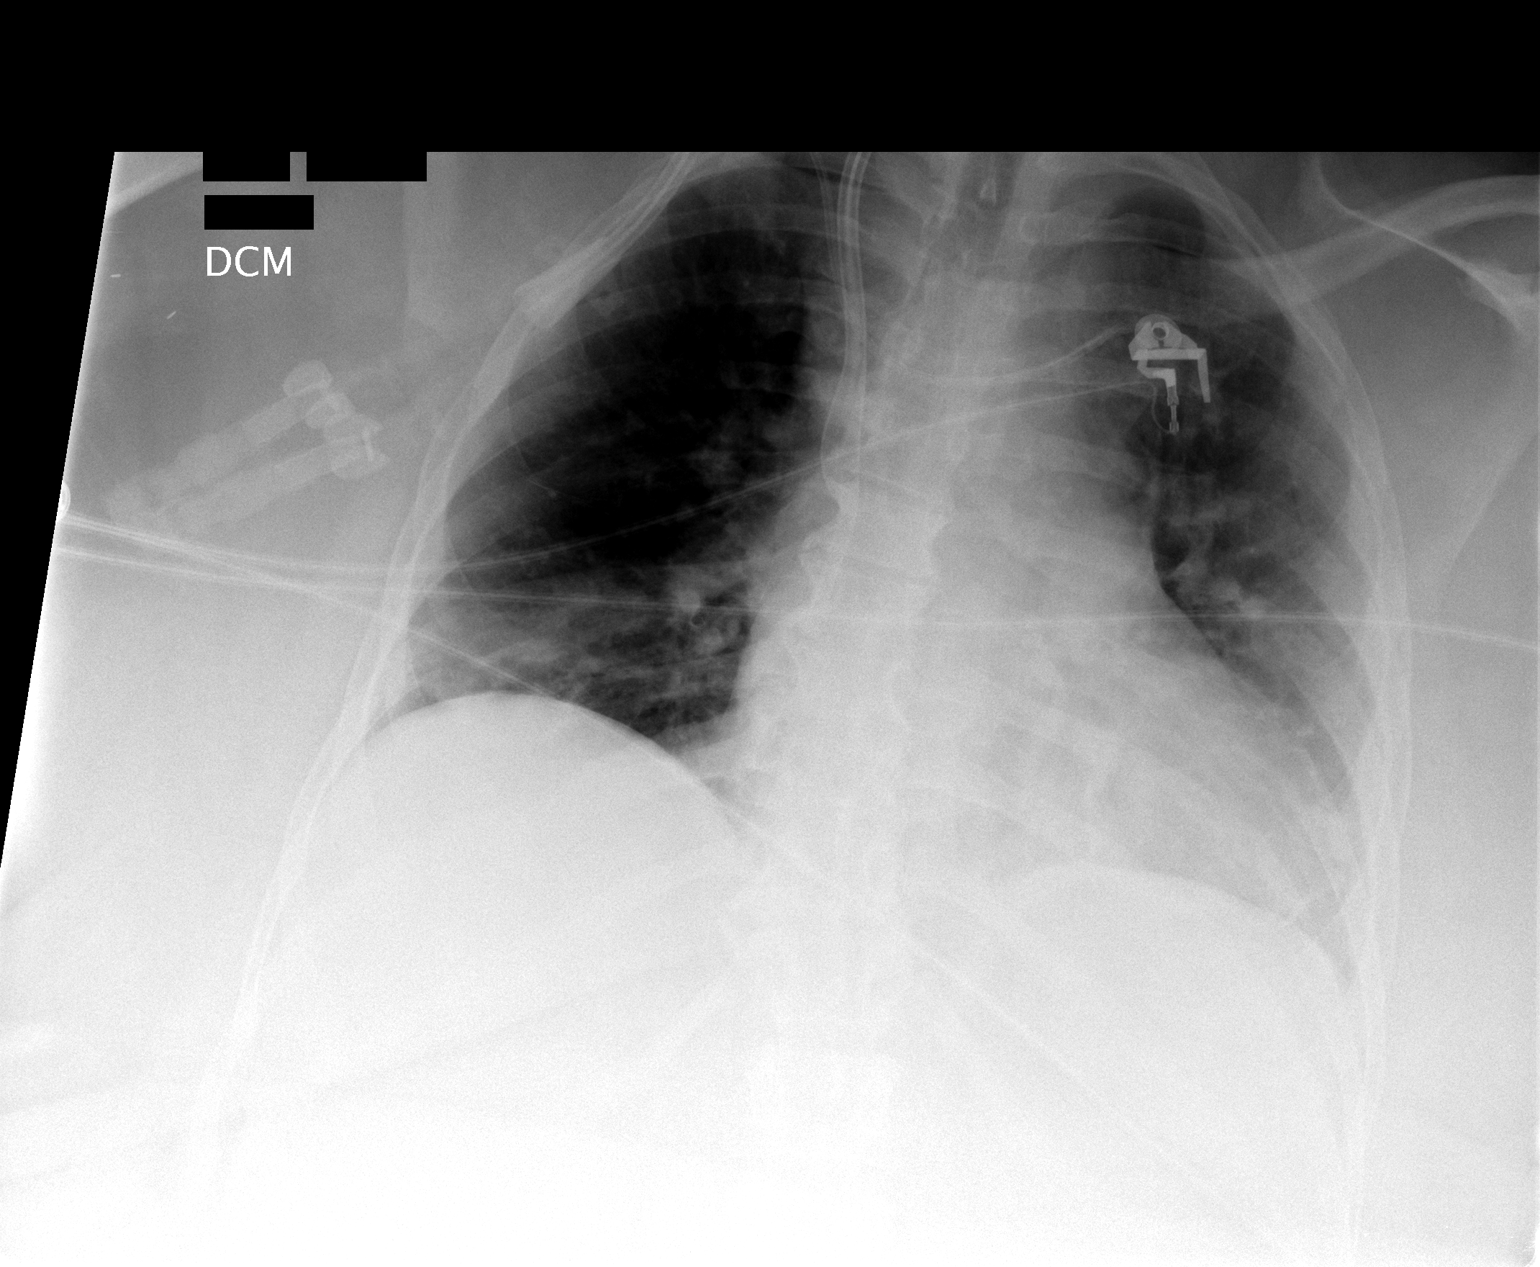

[1 of 1 positions shown; findings below may reference images not displayed]

FINDINGS: Right hemodialysis catheter and central line unchanged in position.
Slight decrease bibasilar atelectasis.
IMPRESSION: Slight decreased bibasilar atelectasis

## 2007-04-04 ENCOUNTER — Encounter (HOSPITAL_COMMUNITY): Admission: RE | Admit: 2007-04-04 | Discharge: 2007-07-03 | Payer: Self-pay | Admitting: Nephrology

## 2007-07-02 IMAGING — XA IR FLUORO GUIDE CV LINE*R*
1 series · 13 of 24 positions shown · non-contrast
Comparison: none

CLINICAL DATA: 1.    REMOVAL OF POOR FUNCTIONING RIGHT IJ TUNNELED DIALYSIS CATHETER:
2.    SVC CATHETERIZATION AND SVC VENOGRAM:
3.    8 AND 10 MM SVC VENOUS ANGIOPLASTY:
4.  SVC INTRAVASCULAR STENT INSERTION:
5.  REPLACEMENT OF RIGHT IJ TUNNELED HEMODIALYSIS CATHETER (27 CM HEMOSPLIT CATHETER).
Radiologist:  Ophelie, Meilleure.   
Guidance:  Fluoroscopic.
Complications:  No immediate complications.   
Medications:  The patient was premedicated with prednisone and Benadryl because of a contrast allergy.  Versed and fentanyl were used for conscious sedation.
Procedure/Findings:  Informed consent was obtained from the patient.   Initially, the existing right IJ tunneled dialysis catheter was removed over two stiff glidewires.  There was difficulty passing the glidewire through the SVC where the catheter tips were positioned concerning for SVC stenosis or occlusion.  Over one of the glidewires, an 8 French sheath was inserted.  SVC venography demonstrated occlusion of the SVC below the azygos junction with extensive mediastinal and azygos collaterals.  The guidewire access was manipulated into the IVC.  Initially, through the 8 French sheath over one of the guidewires, 8 mm venous angioplasty was performed to recanalize the SVC occlusion.  A single inflation was performed for one minute to 30 atmospheres.  The balloon was deflated and removed.  A follow-up venogram demonstrated minimal improvement in the SVC occlusion.  Therefore repeat angioplasty was performed with a 10 mm x 4 cm Atlas balloon.  A single inflation was performed to 18 atmospheres for one minute.  This balloon was also deflated and removed.  Follow-up venogram demonstrated persistent SVC occlusion and large caliber mediastinal and azygos venous collaterals related to elastic recoil and chronic SVC obstruction.  For additional treatment, measurements were obtained for the appropriate stent length and diameter.  Initially a 14 mm x 60 mm cordis SmartStent was deployed in the SVC.  Post-stent angioplasty was performed to 14 mm with a 14 mm x 4 cm Atlas balloon.  There was a persistent distal SVC moderately severe stenosis.  To extend the stented segment, a 14 mm x 40 mm second cordis SmartStent was deployed with approximately 1.5 cm of overlap.  The combination of these two 14 mm stents completely cover the SVC obstruction.  The stents were inflated to profile with overlapping 14 mm inflations utilizing a 14 mm x 4 cm Atlas balloon.  The balloon was deflated and removed.  A follow-up venogram through the 8 French sheath demonstrated a widely patent recanalized SVC with no collateral venous opacification.
At this point, the guidewire access was exchanged for a stiff glidewire.  A second safety guidewire was inserted into the IVC as well.  Over the double stiff glidewire access, a new 27 cm tip-to-cuff Hemosplit catheter was advanced with some difficulty through the SVC stented segment with the tips positioned in the proximal to mid right atrium.  The catheter functioned well and was finally flushed with saline followed by the appropriate volume and strength of heparin.  The catheter was secured at the neck with a 0 Prolene retention suture.  The small incision overlying the retention cuff was closed with subcuticular 4-0 vicryl suture.  Overall the patient tolerated the procedure well.  There were no immediate complications.
SVC VENOGRAM:  There is occlusion of the SVC below the azygos junction with mediastinal and azygos collaterals.  Access was obtained across the SVC occlusion.  8 and 10 mm angioplasty was performed with no significant improvement in a persistent SVC obstruction with elastic recoil.  For additional treatment, 14 mm overlapping cordis SmartStents were deployed with recanalization of the SVC and no residual SVC stenosis or obstruction.

[Series 1000: run · 13 of 59 slices shown]
[im 1/59]
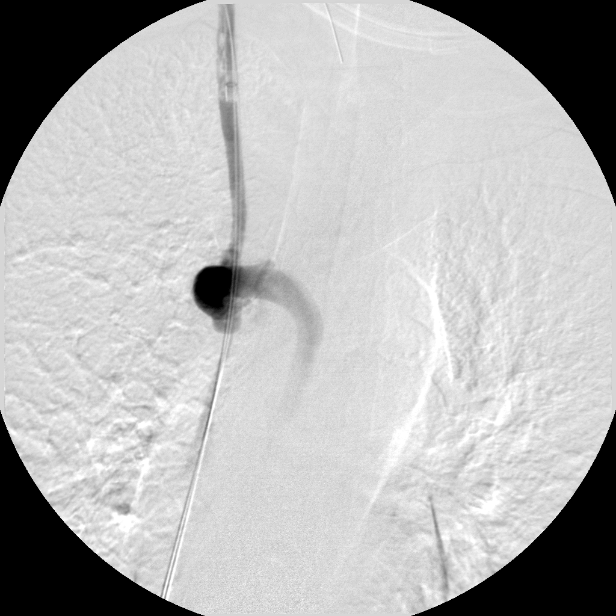
[im 6/59]
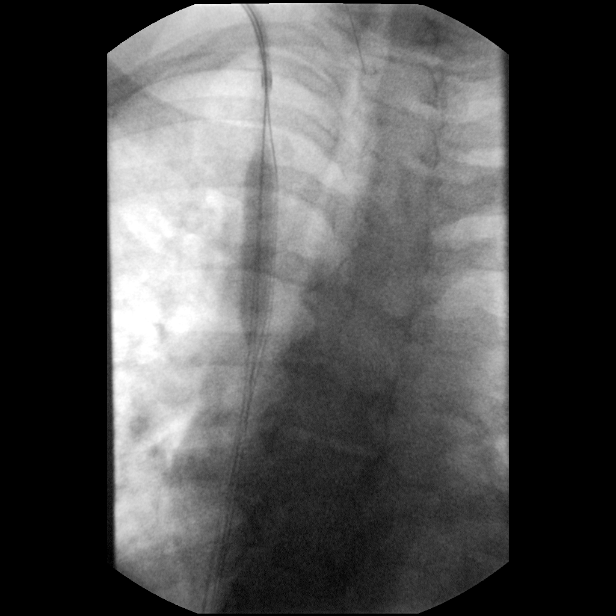
[im 11/59]
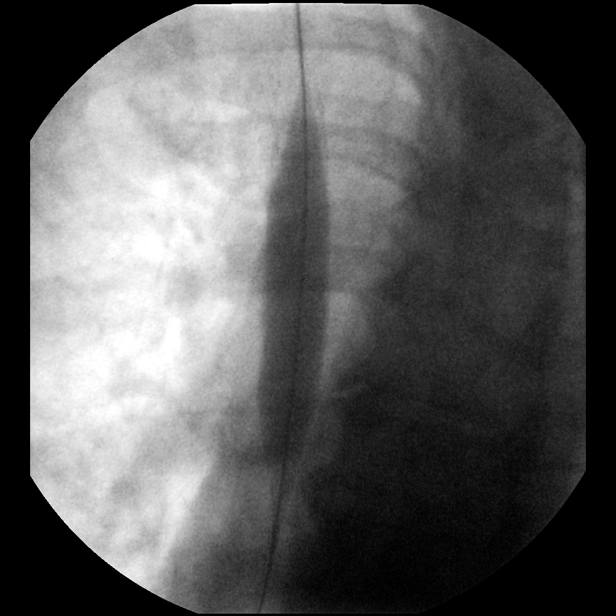
[im 16/59]
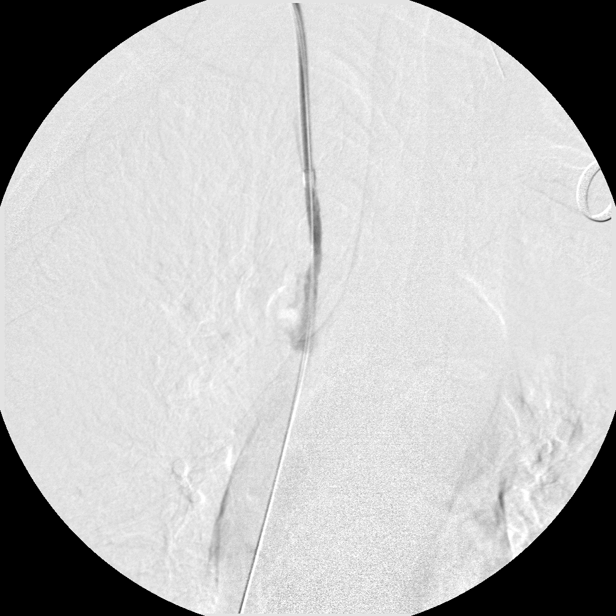
[im 21/59]
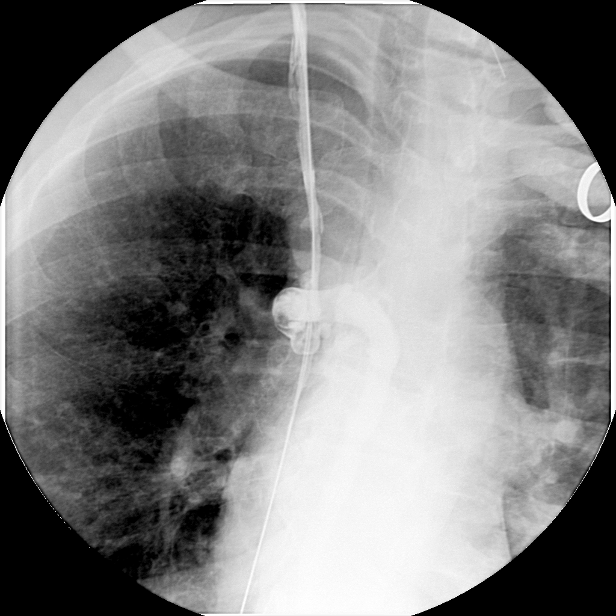
[im 26/59]
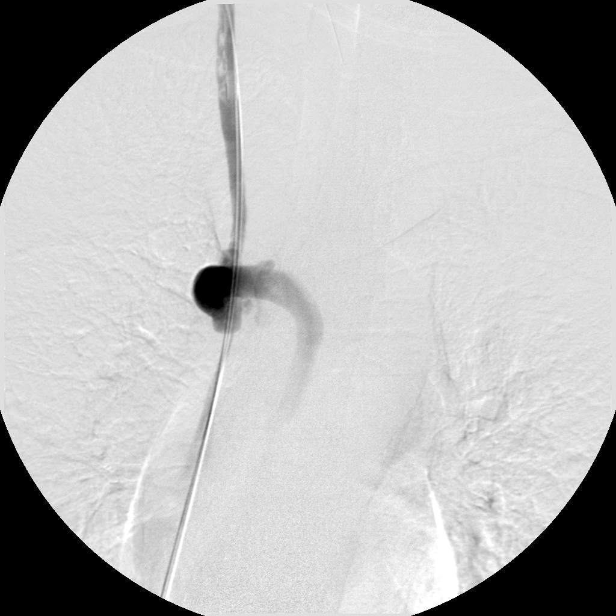
[im 31/59]
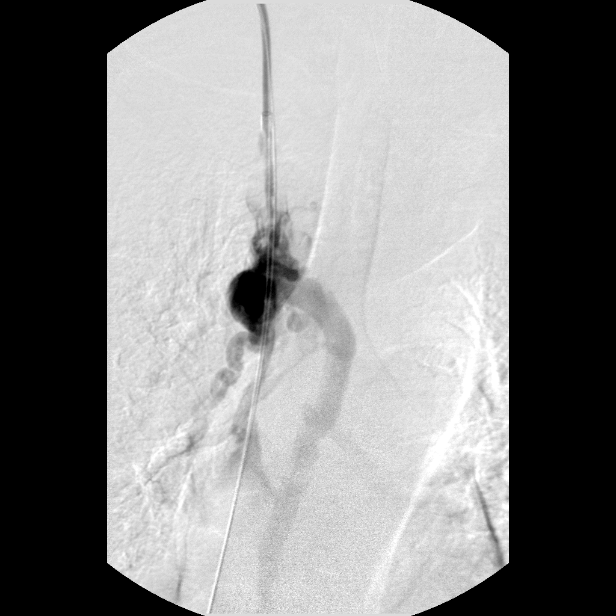
[im 33/59]
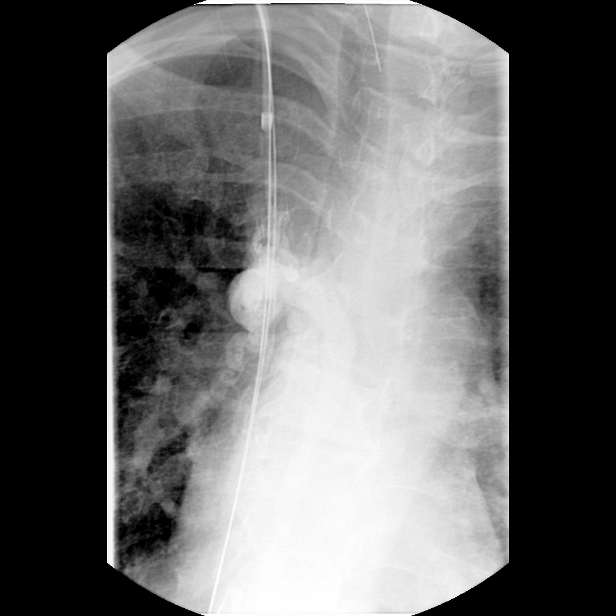
[im 38/59]
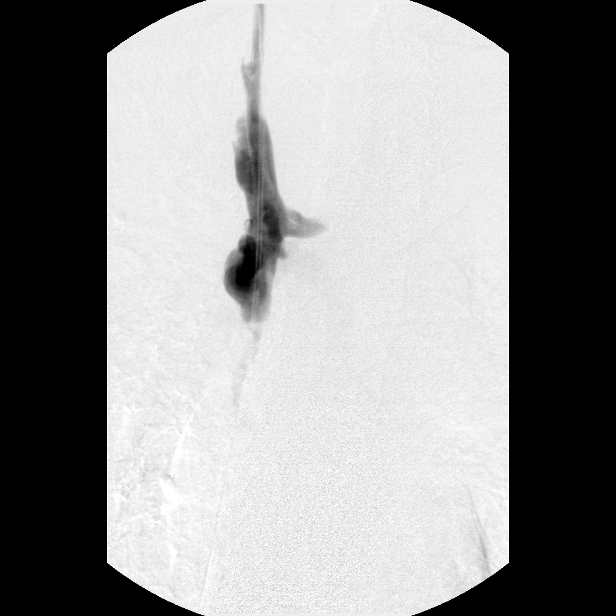
[im 43/59]
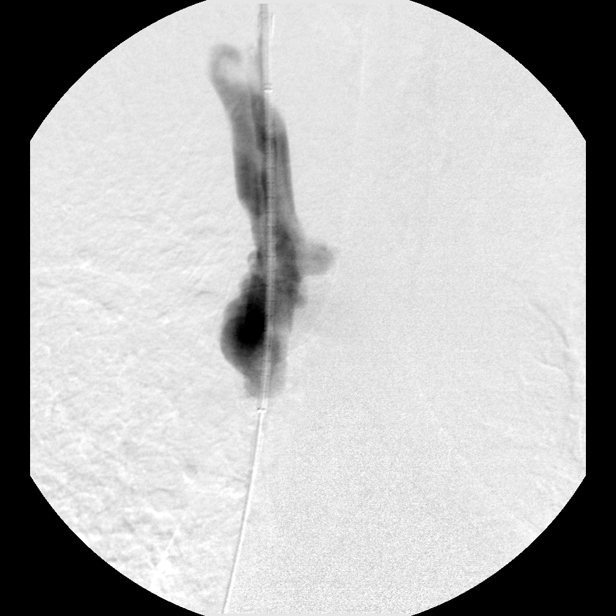
[im 48/59]
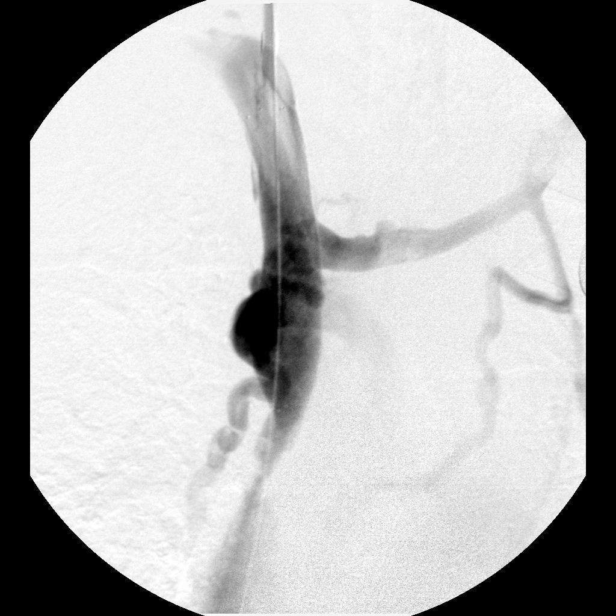
[im 53/59]
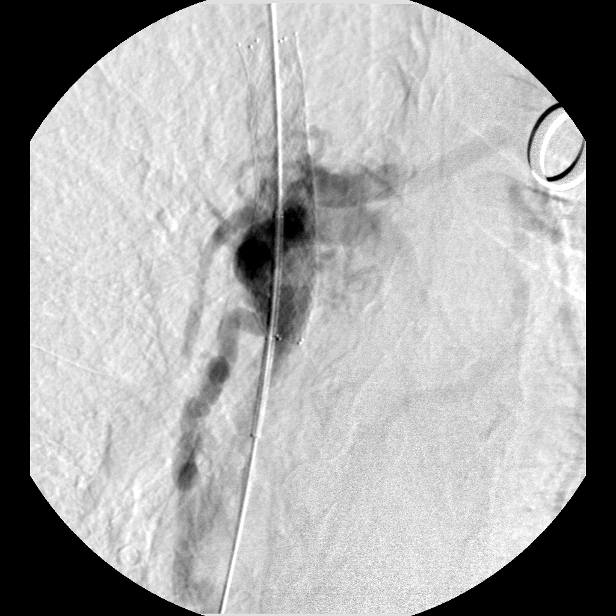
[im 59/59]
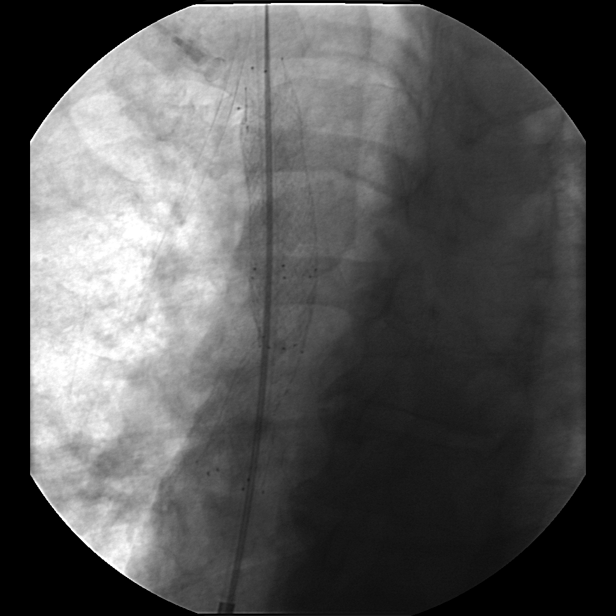

[13 of 24 positions shown; findings below may reference images not displayed]

IMPRESSION: 1.  SVC occlusion treated initially with angioplasty but ultimately requiring recanalization with 2 14mm stents as described.
2.  Reposition of the retracted Hemosplit catheter through the recanalized stented SVC with the tips now in the proximal to mid right atrium.
Plan:  The patient will be recovered for one hour and if remains stable discharged.  The catheter is ready for immediate use.

## 2007-08-23 ENCOUNTER — Ambulatory Visit (HOSPITAL_COMMUNITY): Admission: RE | Admit: 2007-08-23 | Discharge: 2007-08-23 | Payer: Self-pay | Admitting: Diagnostic Radiology

## 2007-08-25 ENCOUNTER — Ambulatory Visit (HOSPITAL_COMMUNITY): Admission: RE | Admit: 2007-08-25 | Discharge: 2007-08-25 | Payer: Self-pay | Admitting: Nephrology

## 2007-11-03 IMAGING — CR DG CHEST 1V PORT
1 series · 1 of 1 positions shown · non-contrast
Comparison: Earlier in the day at 8788 hours.

CLINICAL DATA: Sepsis.  Rule out pneumothorax.
 PORTABLE CHEST -  SINGLE VIEW - 06/22/06:

[view not recorded]
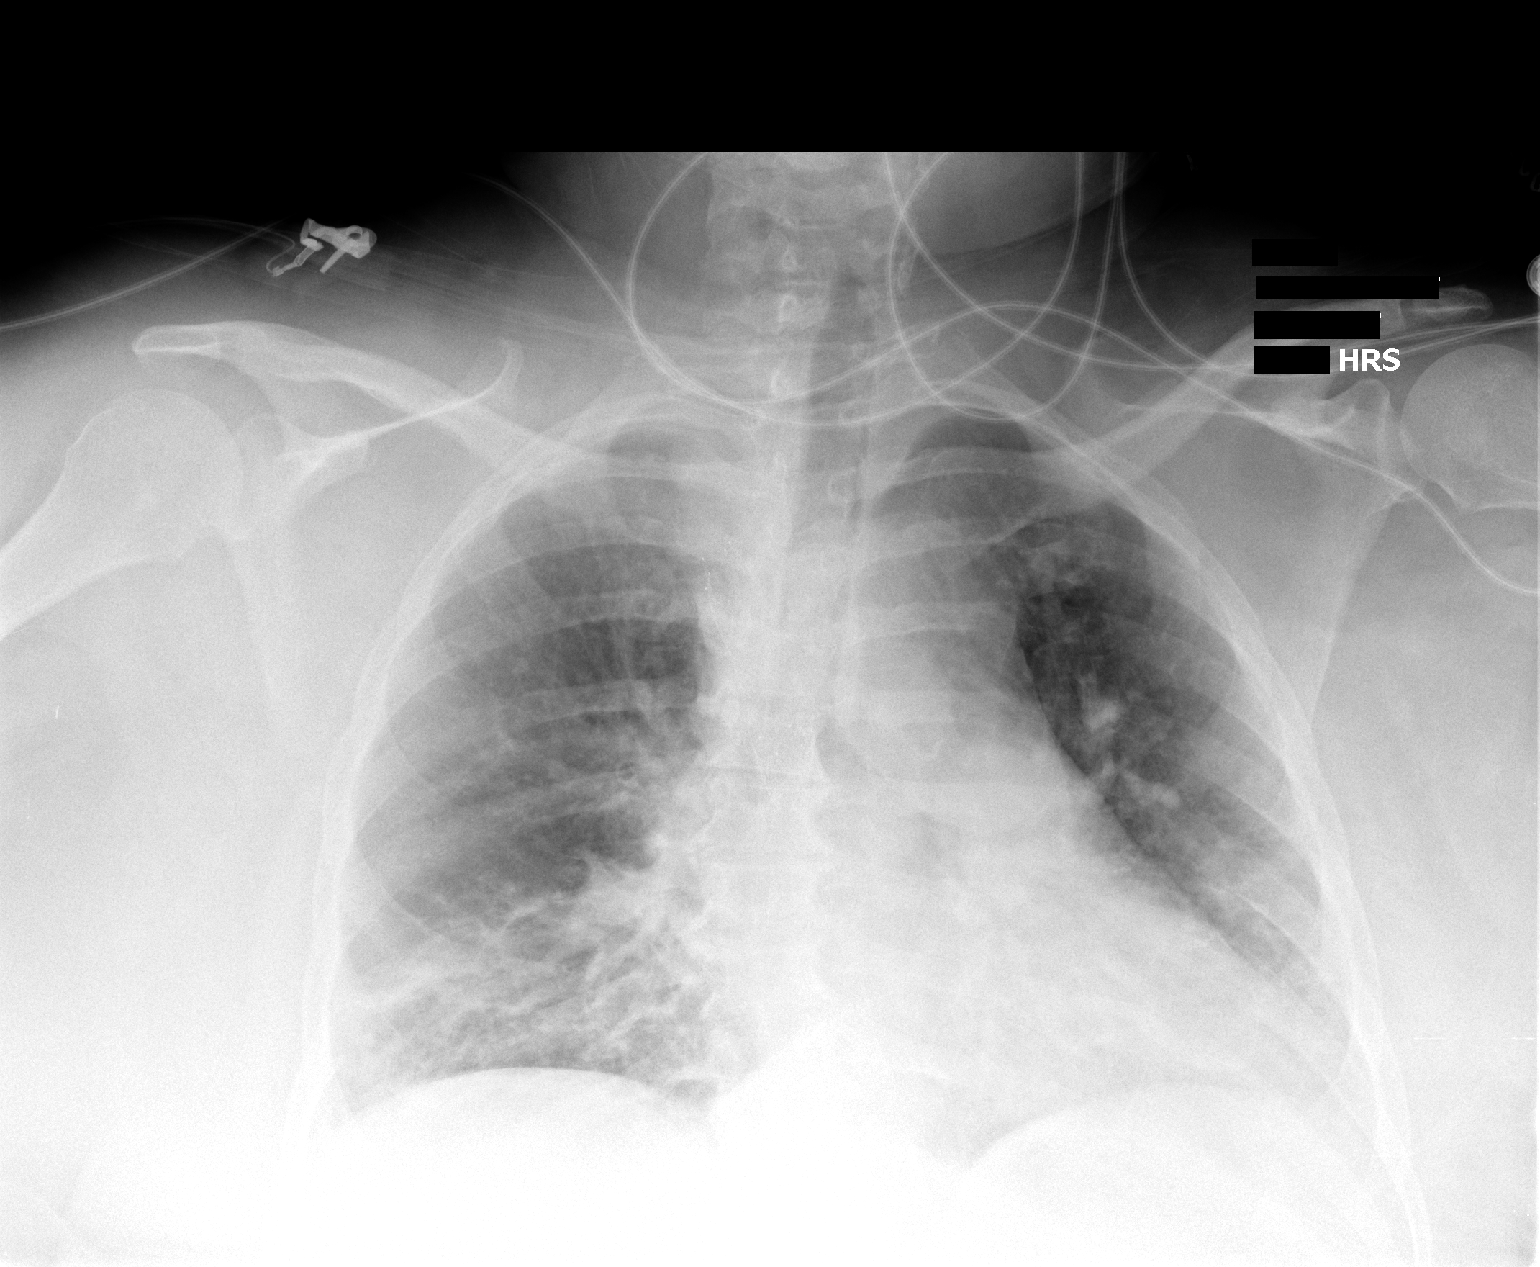

[1 of 1 positions shown; findings below may reference images not displayed]

FINDINGS: The patient is minimally rotated to the left.  The trachea is midline.   The heart is enlarged.  Lung volumes are further diminished.  This results in prominence of the pulmonary interstitium.  There is no overt failure.  There are increased air space opacities in bilateral lower lobes.  The costophrenic angles are sharp.  There is no pneumothorax.  
 There is mild pulmonary venous congestion.
IMPRESSION: 1.  Interval removal of dialysis catheter.  No evidence of pneumothorax.
 2.  Cardiomegaly with further diminished lung volumes.  Pulmonary venous congestion without overt congestive failure.
 3.  Increased bibasilar air space opacities most likely related to atelectasis.

## 2007-11-03 IMAGING — CR DG CHEST 2V
2 series · 2 of 2 positions shown · non-contrast
Comparison: 02/10/05.

CLINICAL DATA: Chest pain, weakness.
 CHEST - 2 VIEW: 
 PA and lateral chest -  06/22/06.

[w chest lat]
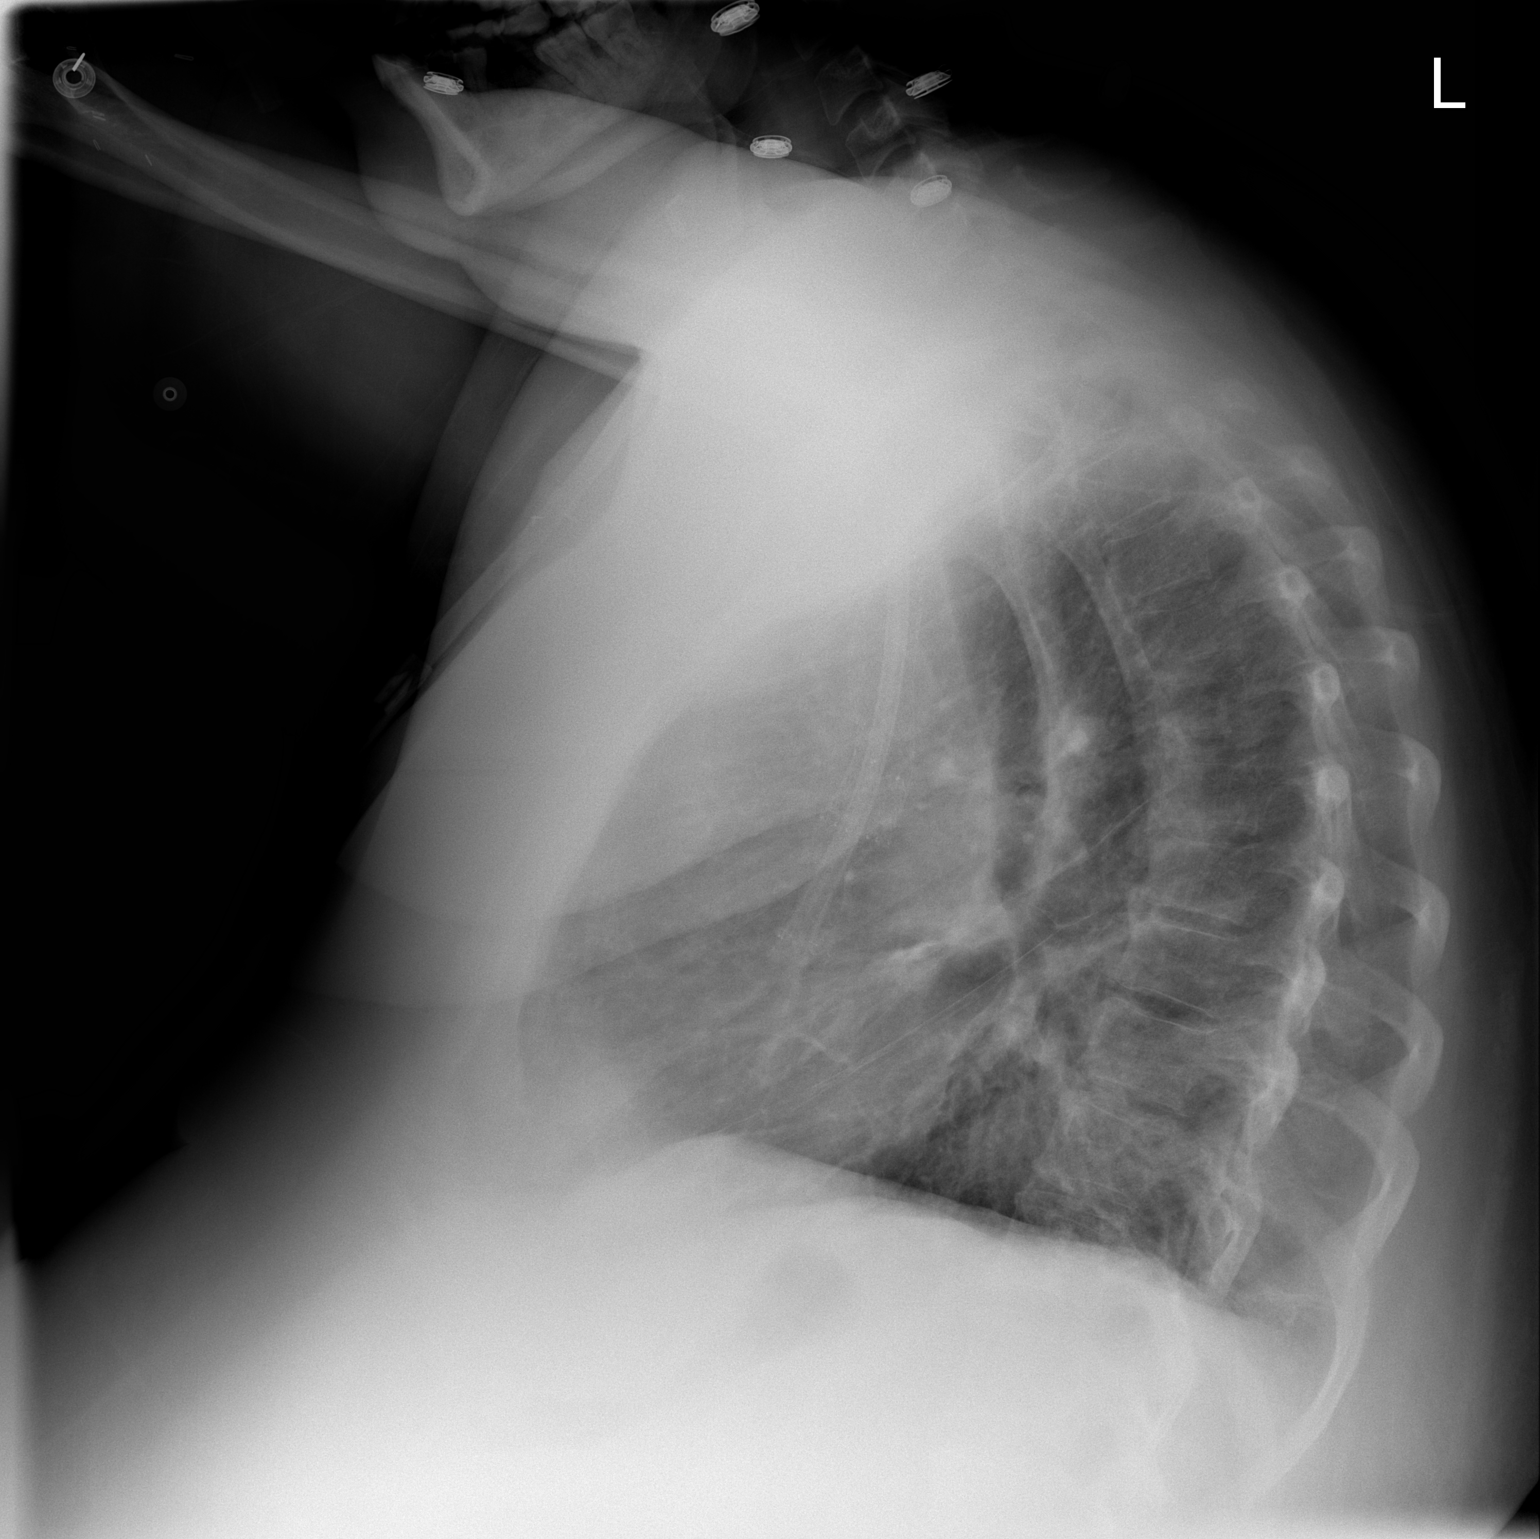

[w chest pa]
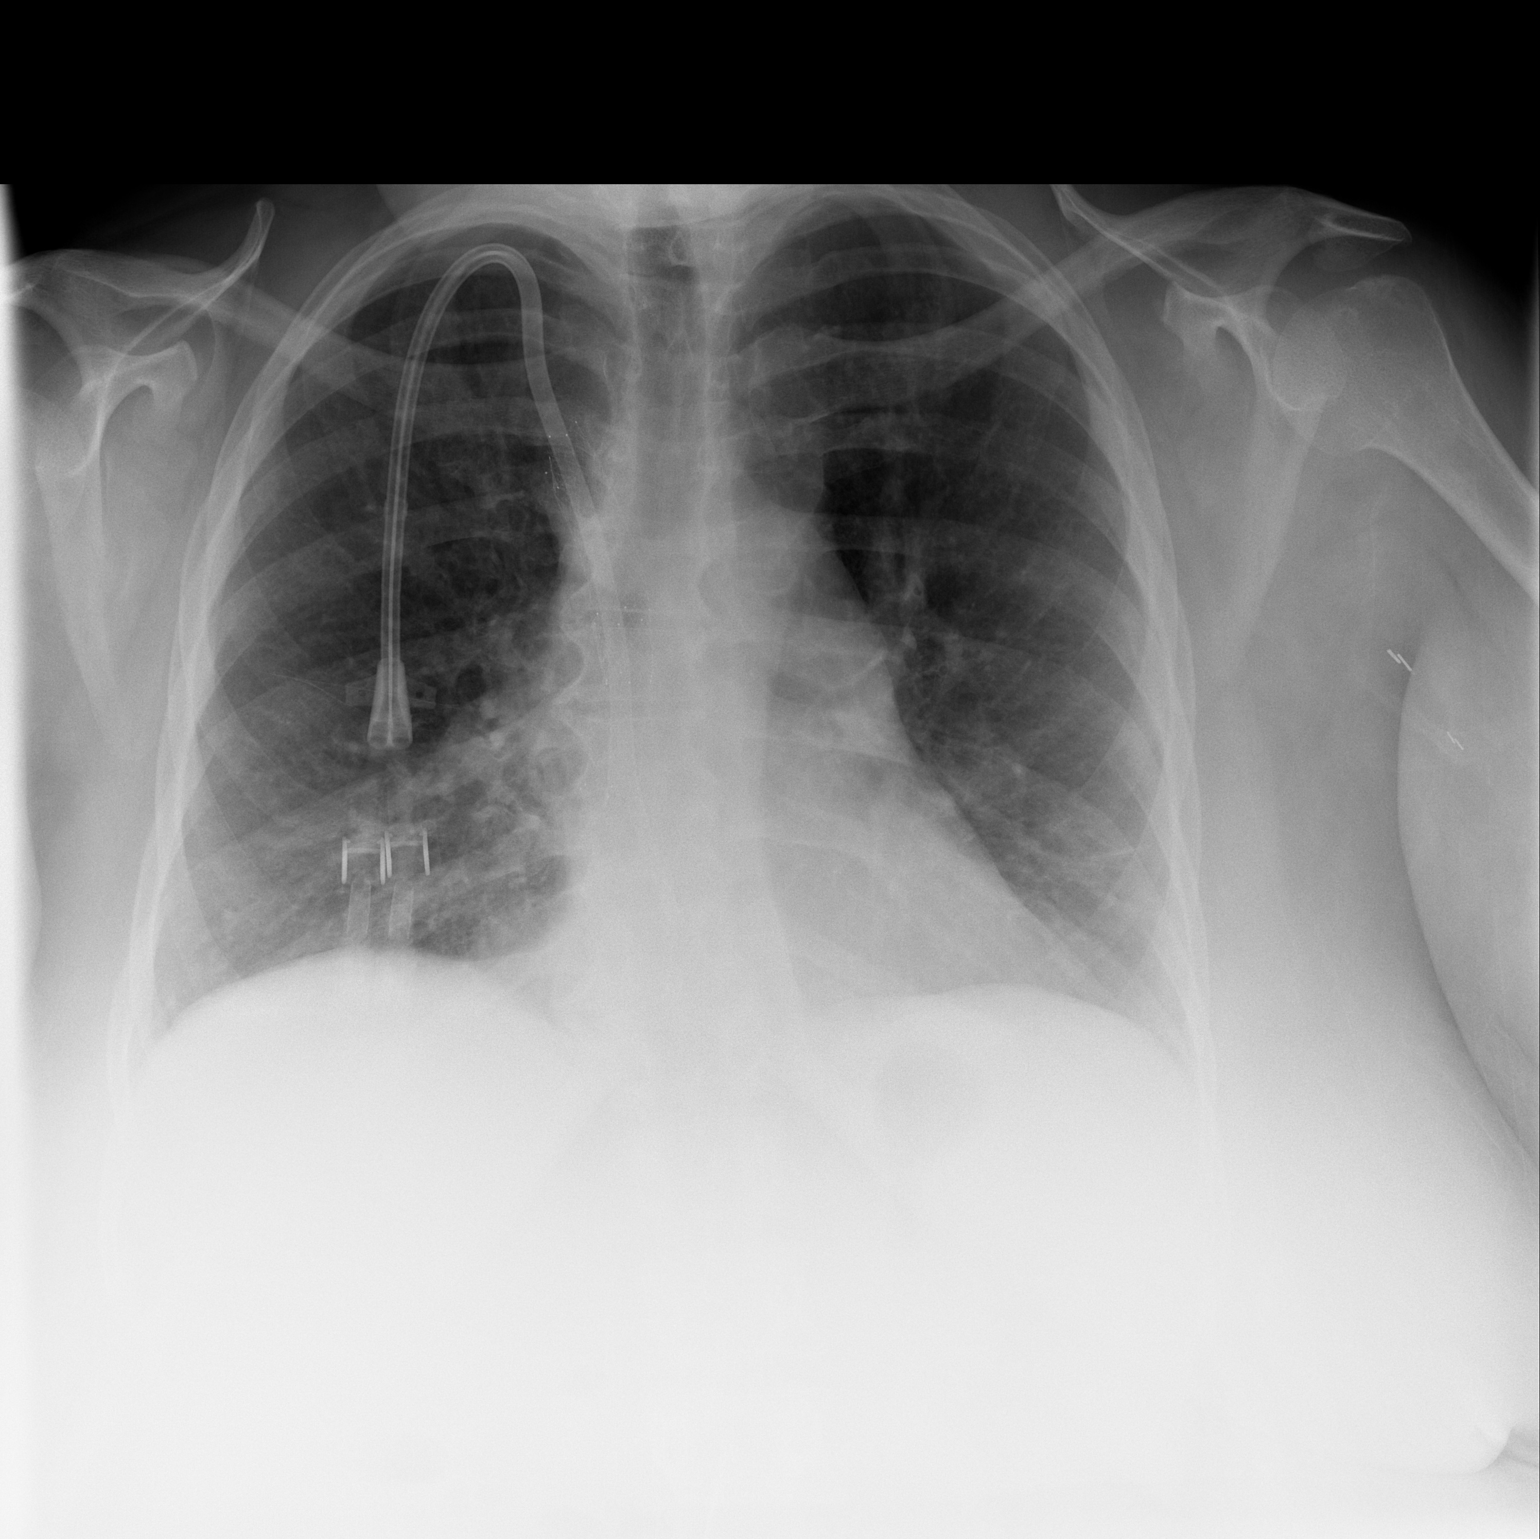

[2 of 2 positions shown; findings below may reference images not displayed]

FINDINGS: A right-sided hemodialysis dual-lumen catheter tip terminates over the right atrium.  The heart size is normal.   Lung volumes are low with bibasilar subsegmental atelectasis.  No effusion.  Spine degenerative change is present.
IMPRESSION: Low lung volumes with bibasilar atelectasis.

## 2007-11-07 IMAGING — CR DG CHEST 1V PORT
1 series · 1 of 1 positions shown · non-contrast
Comparison: none

CLINICAL DATA: Sepsis.  Diatek catheter insertion.
 PORTABLE CHEST ? 1 VIEW ? 06/26/06 ? 8768 HOURS:

[view not recorded]
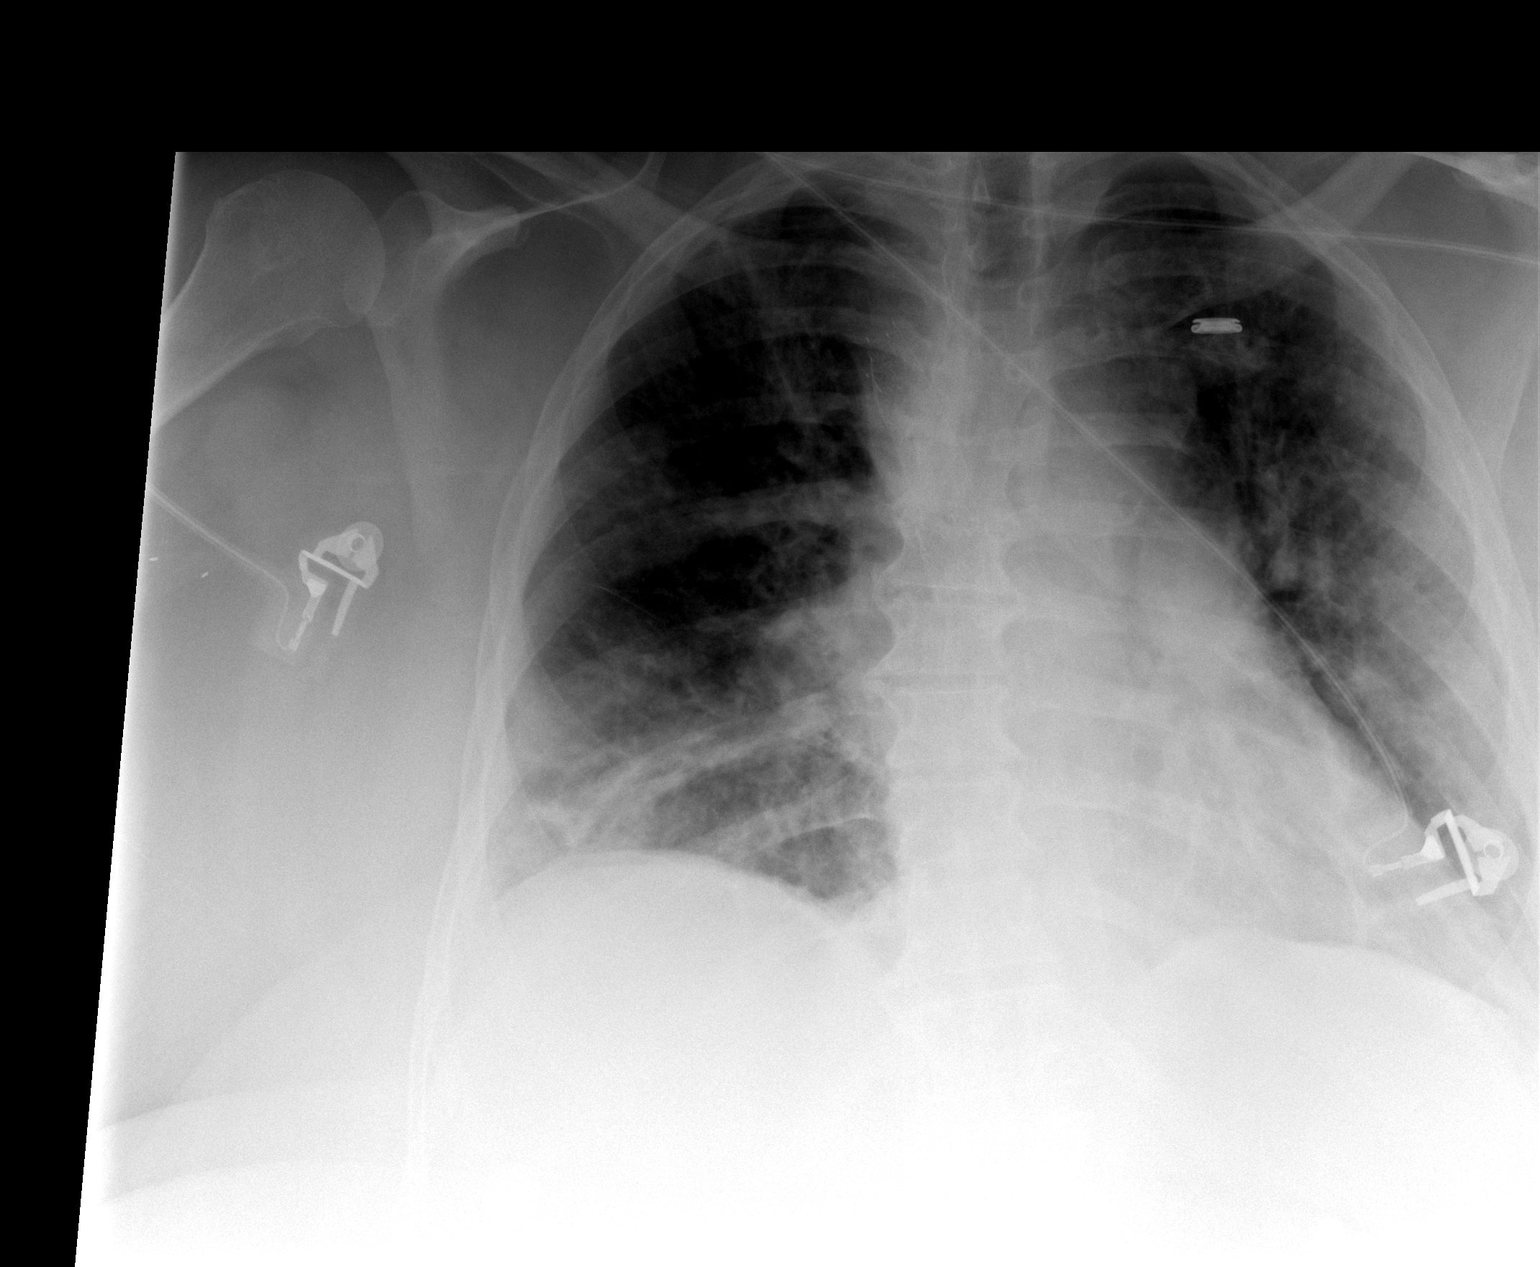

[1 of 1 positions shown; findings below may reference images not displayed]

FINDINGS: The Diatek catheter extends from the abdomen through the IVC and the tip is at the IVC/right atrial junction.  No pneumothoraces are seen.  Bibasilar atelectasis and vascular congestion are stable.
IMPRESSION: Interval placement of a femoral dialysis catheter with the tip at the IVC/right atrial junction.

## 2007-11-08 IMAGING — CR DG CHEST 2V
2 series · 2 of 2 positions shown · non-contrast
Comparison: [DATE].

CLINICAL DATA: Sepsis.  End stage renal disease.  Follow-up post dialysis. 
 CHEST - 2 VIEW:

[w chest pa]
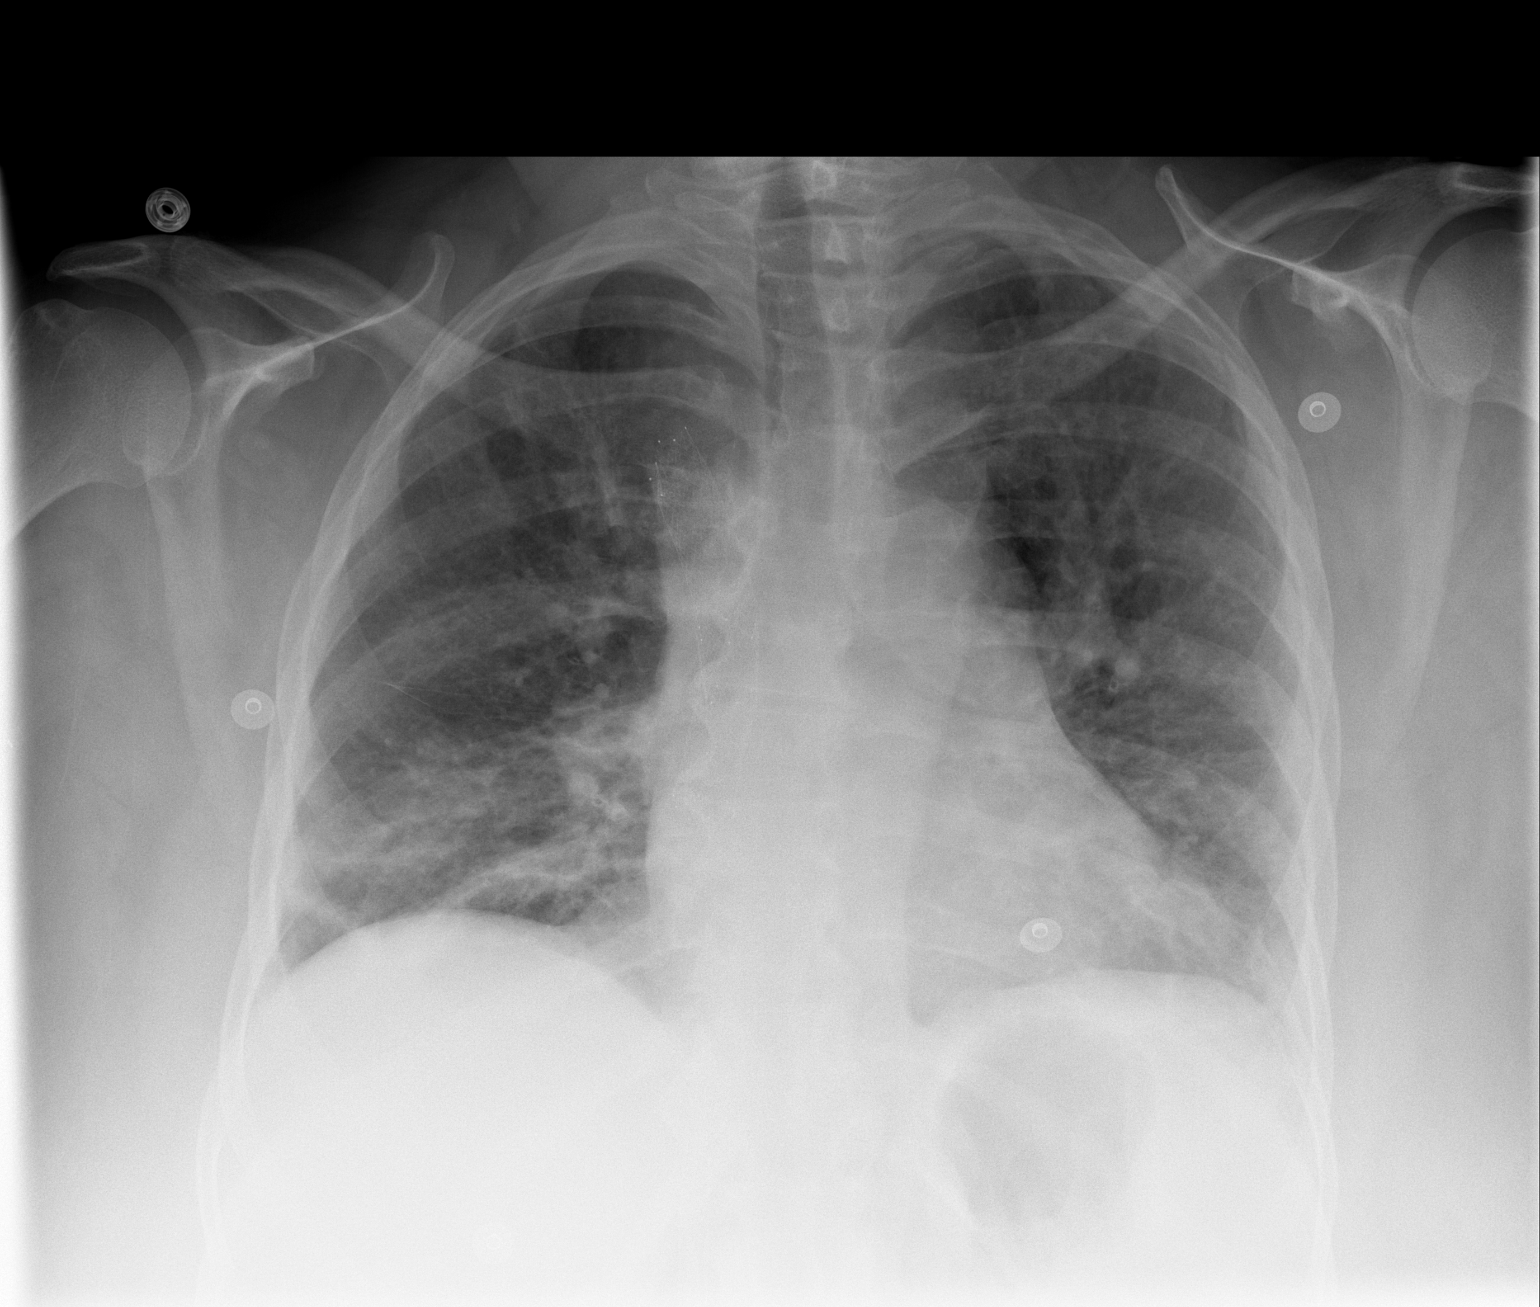

[w chest lat]
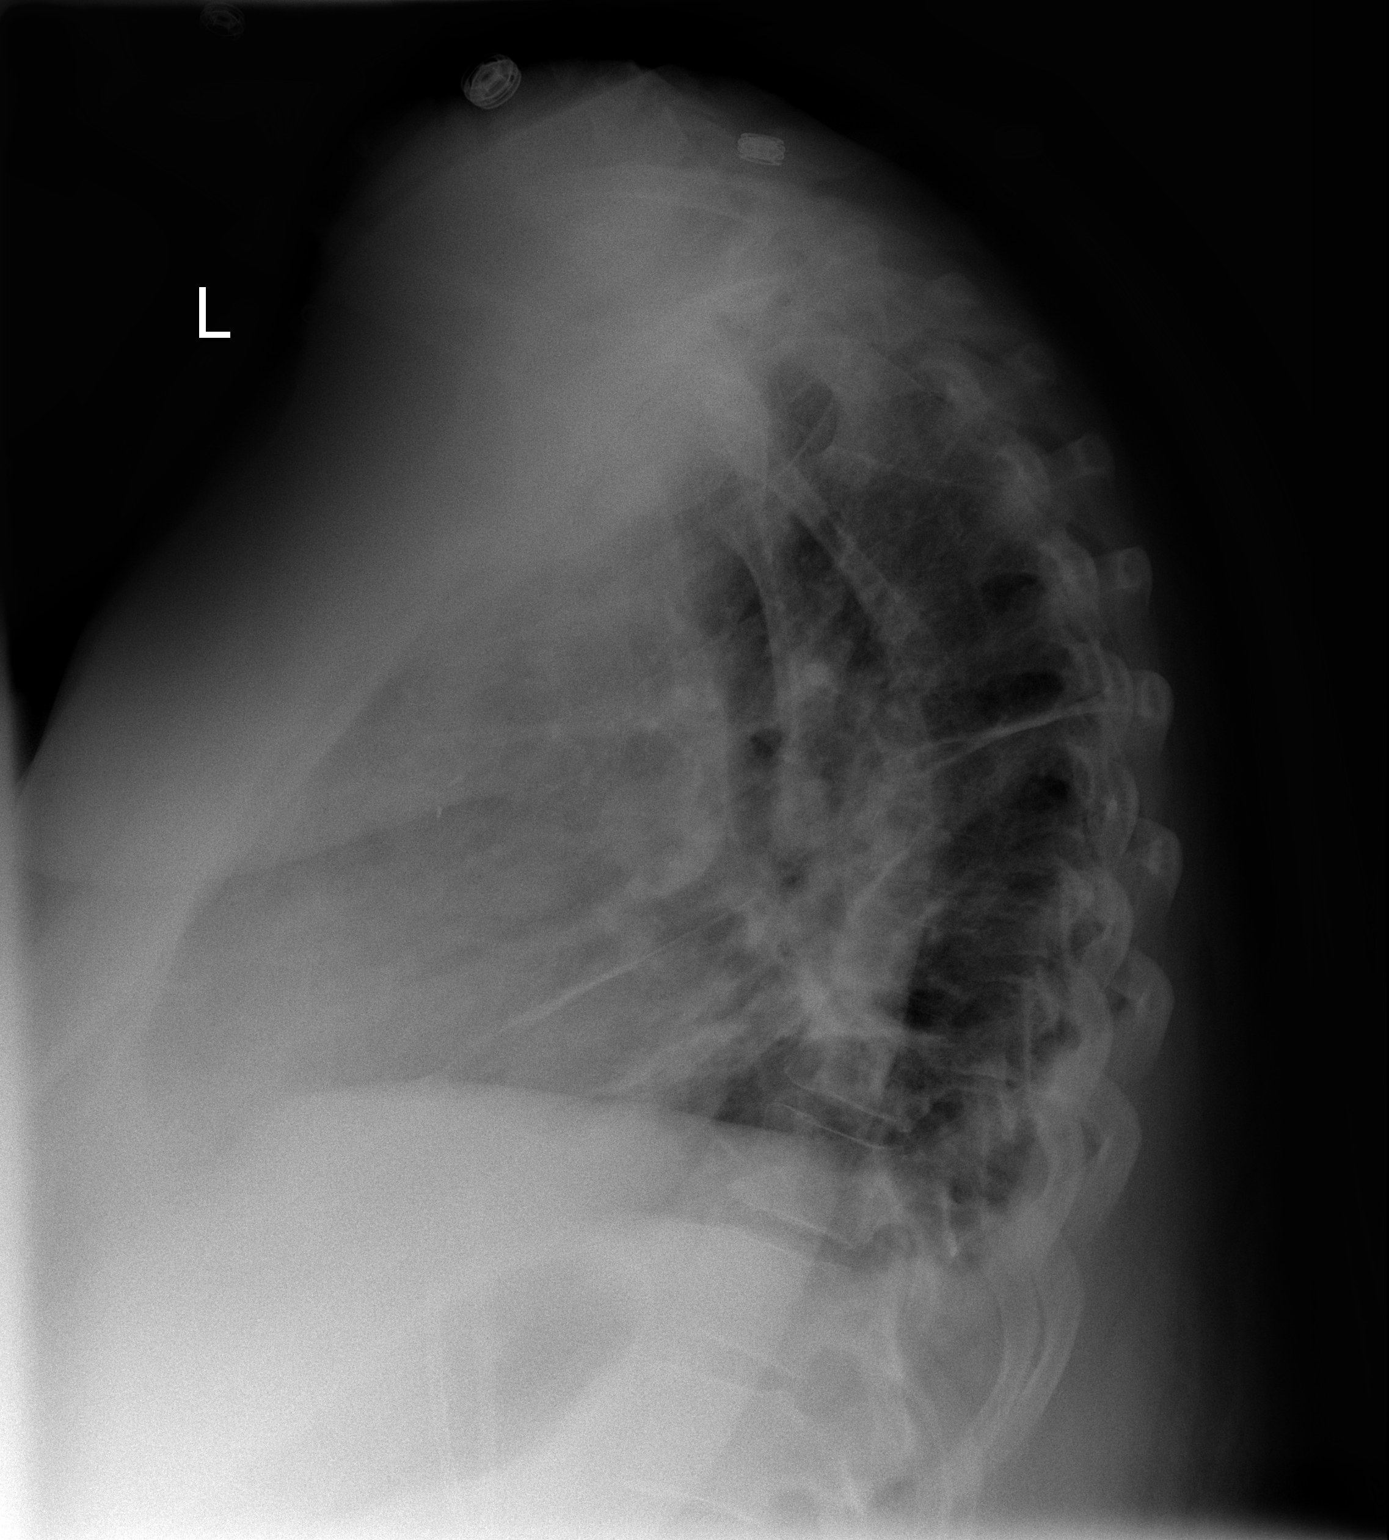

[2 of 2 positions shown; findings below may reference images not displayed]

FINDINGS: The heart remains mildly enlarged.  Vena caval stent remains in place.  Mild edema persists with mild basilar atelectasis.  No pronounced change since yesterday.
IMPRESSION: Mild edema and basilar atelectasis persists.

## 2008-01-01 IMAGING — CR DG CHEST 2V
2 series · 2 of 2 positions shown · non-contrast
Comparison: none

CLINICAL DATA: 44-year-old female, right arm swelling.  Chest pain.  Shoulder pain. 
 CHEST - 2 VIEW:

[view not recorded (1 of 2)]
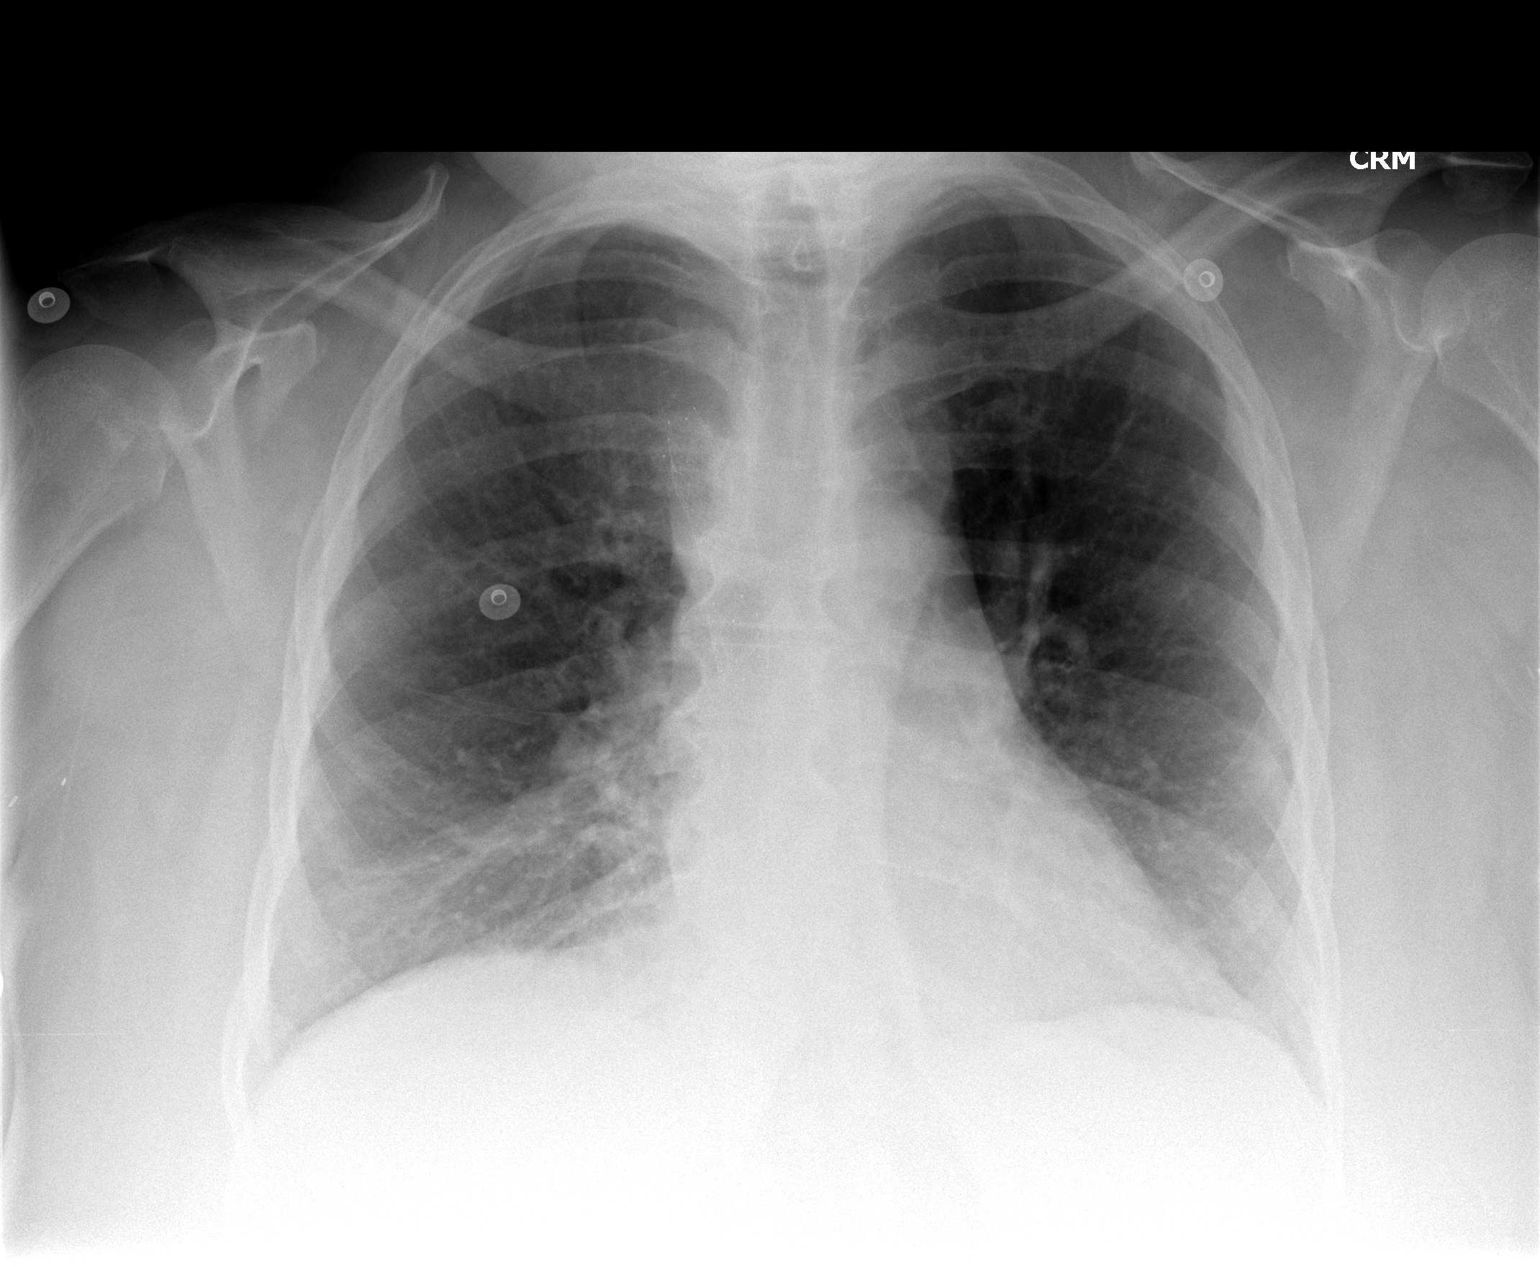

[view not recorded (2 of 2)]
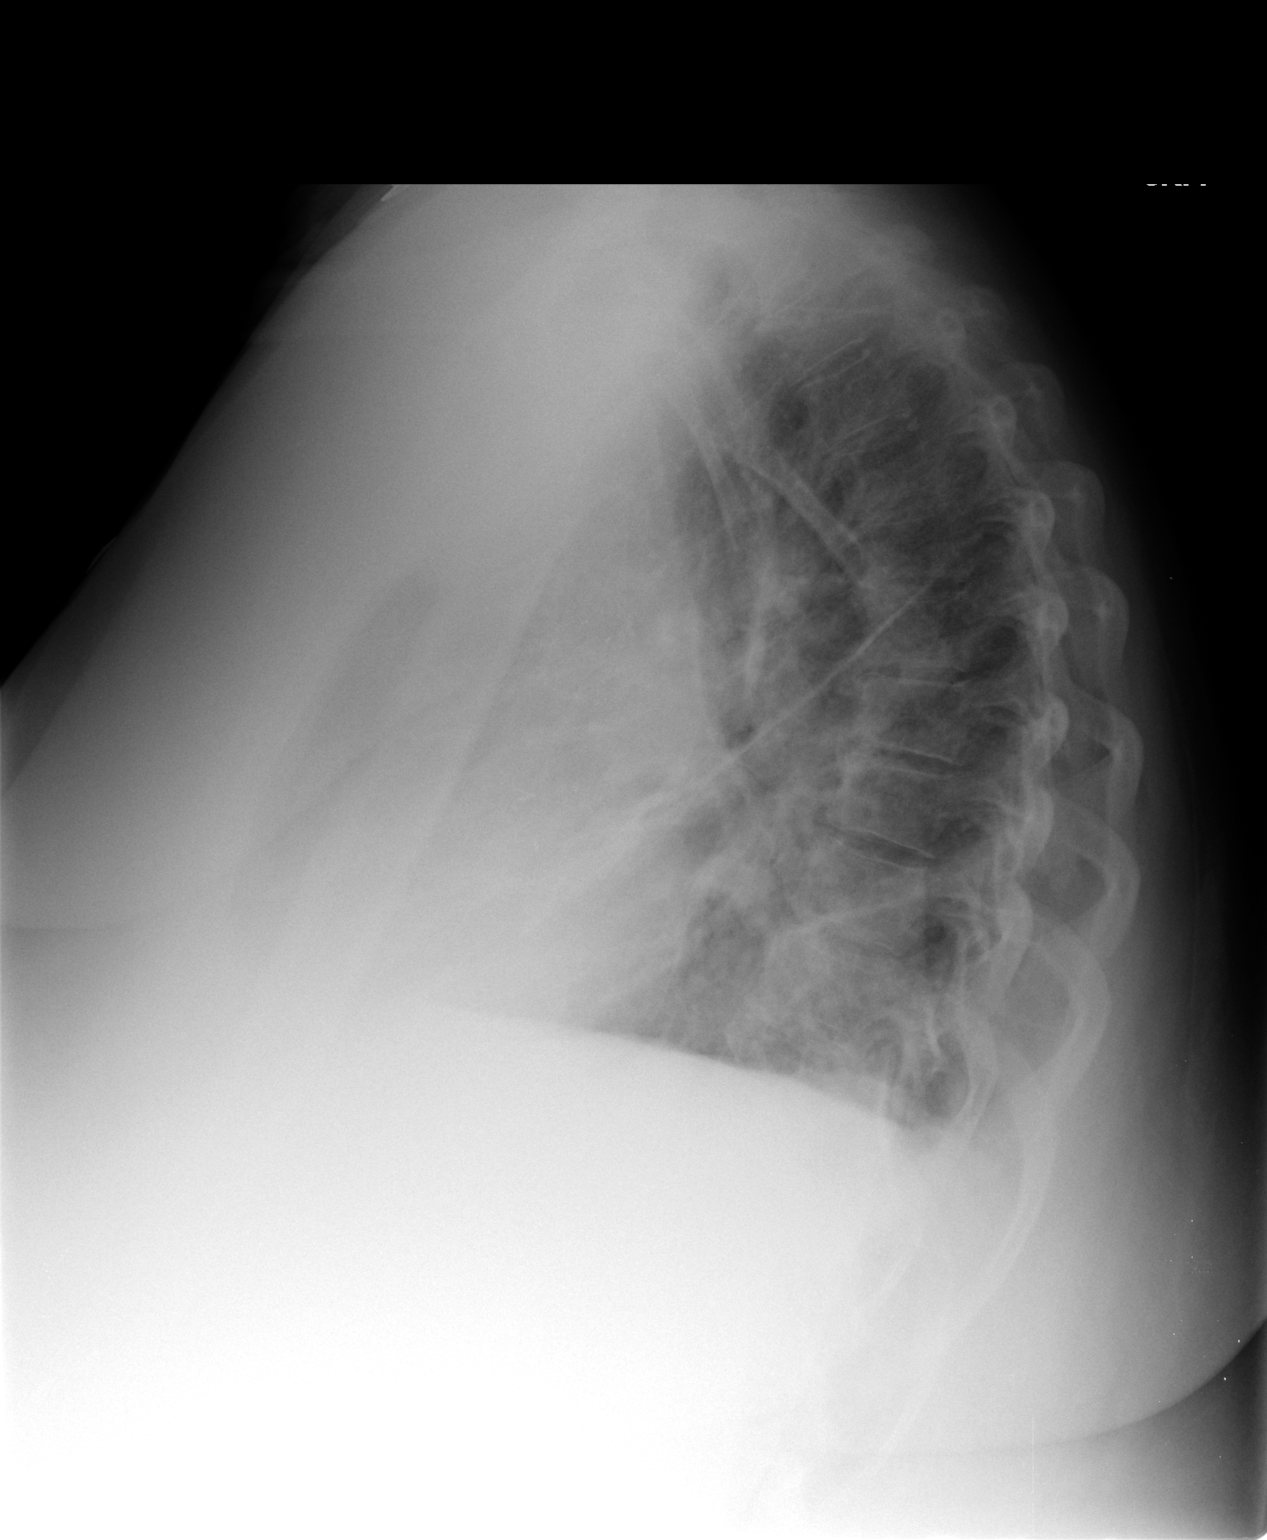

[2 of 2 positions shown; findings below may reference images not displayed]

FINDINGS: The cardiopericardial silhouette is within normal limits for size.  There is improved aeration with some residual bibasilar atelectasis.  AP image is at a slightly different angle than on prior study.  However, there is significant widening of the subacromial space right worse than left.  This raises the possibility of right shoulder dislocation accounting for the patient?s pain and swelling.
IMPRESSION: 1.  Persistent bibasilar atelectasis with improved aeration. 
 2.  Question dislocation right shoulder.  Recommended dedicated views of the shoulder.

## 2008-01-02 IMAGING — CR DG SHOULDER 2+V*R*
4 series · 4 of 4 positions shown · non-contrast
Comparison: none

CLINICAL DATA: 44-year-old female right arm swelling.  Pain.  
 RIGHT SHOULDER ? 3 VIEW:

[view not recorded (1 of 4)]
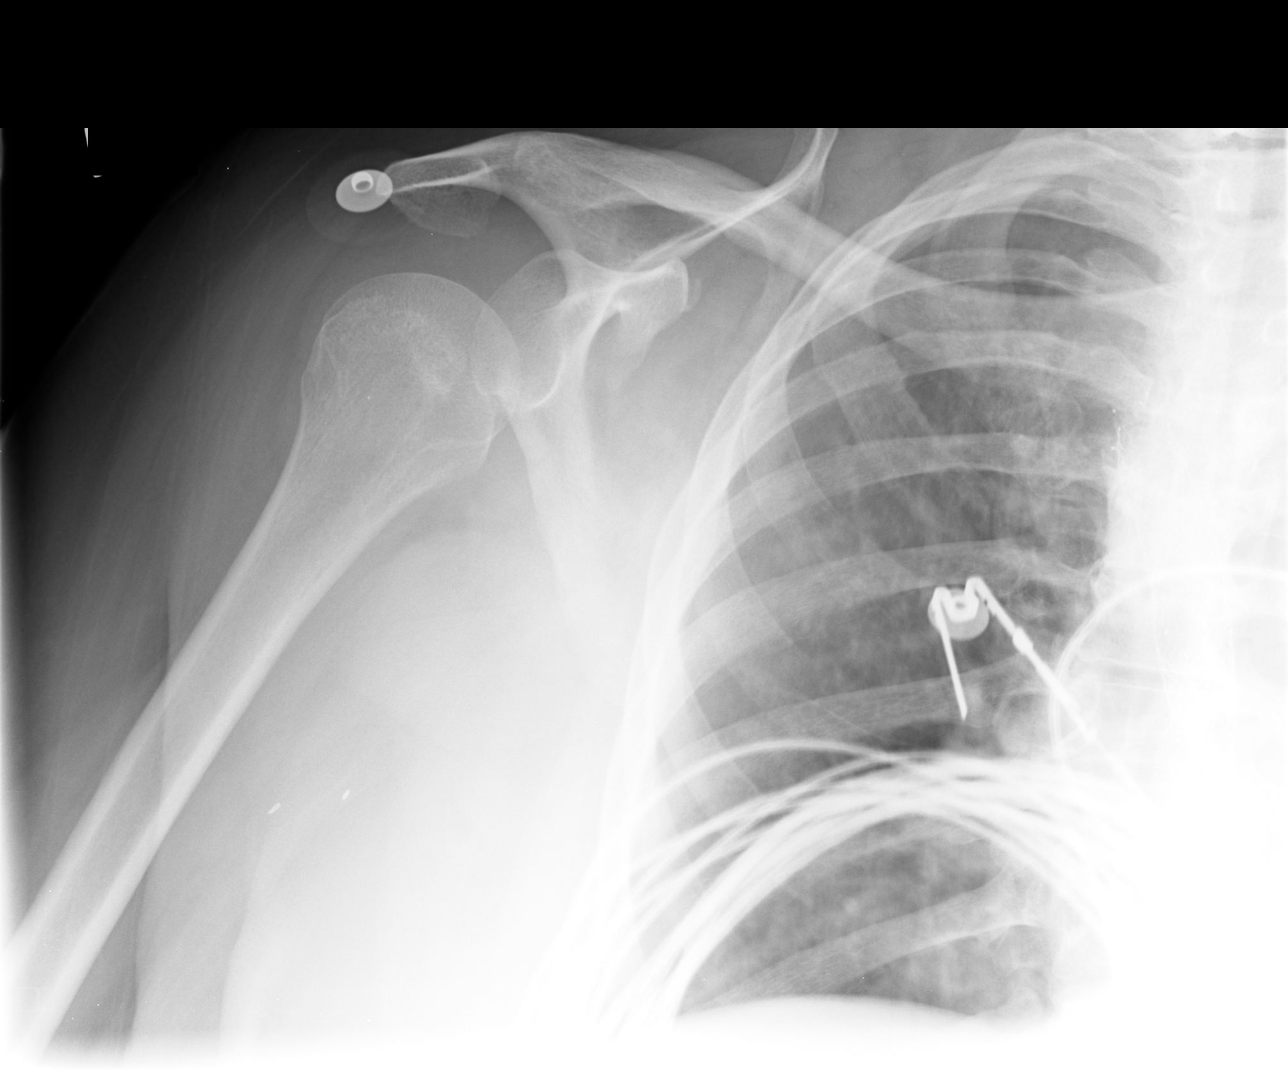

[view not recorded (2 of 4)]
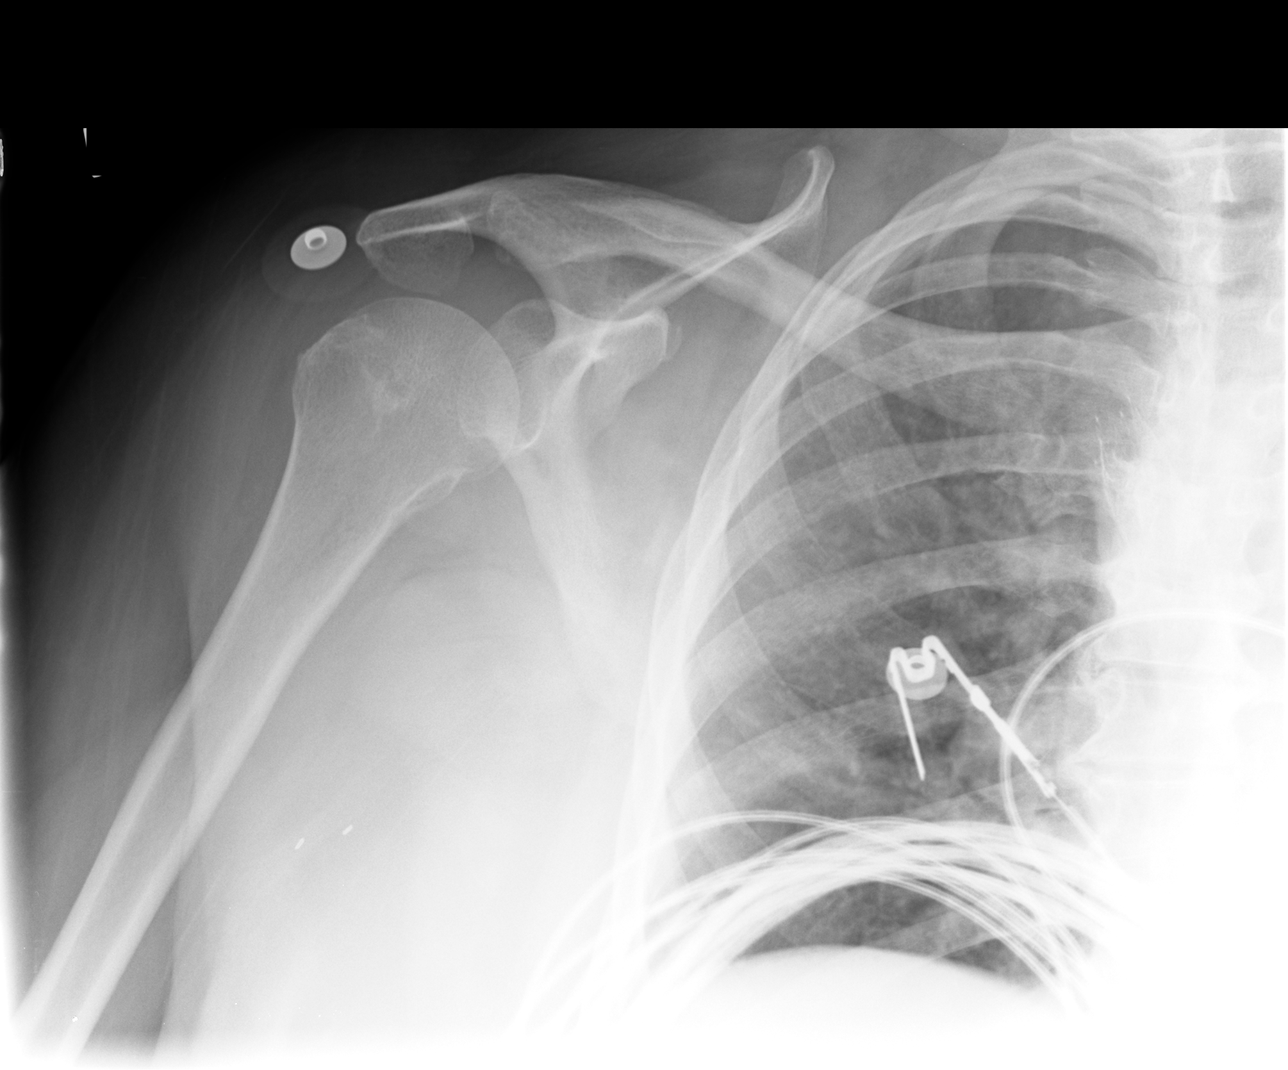

[view not recorded (3 of 4)]
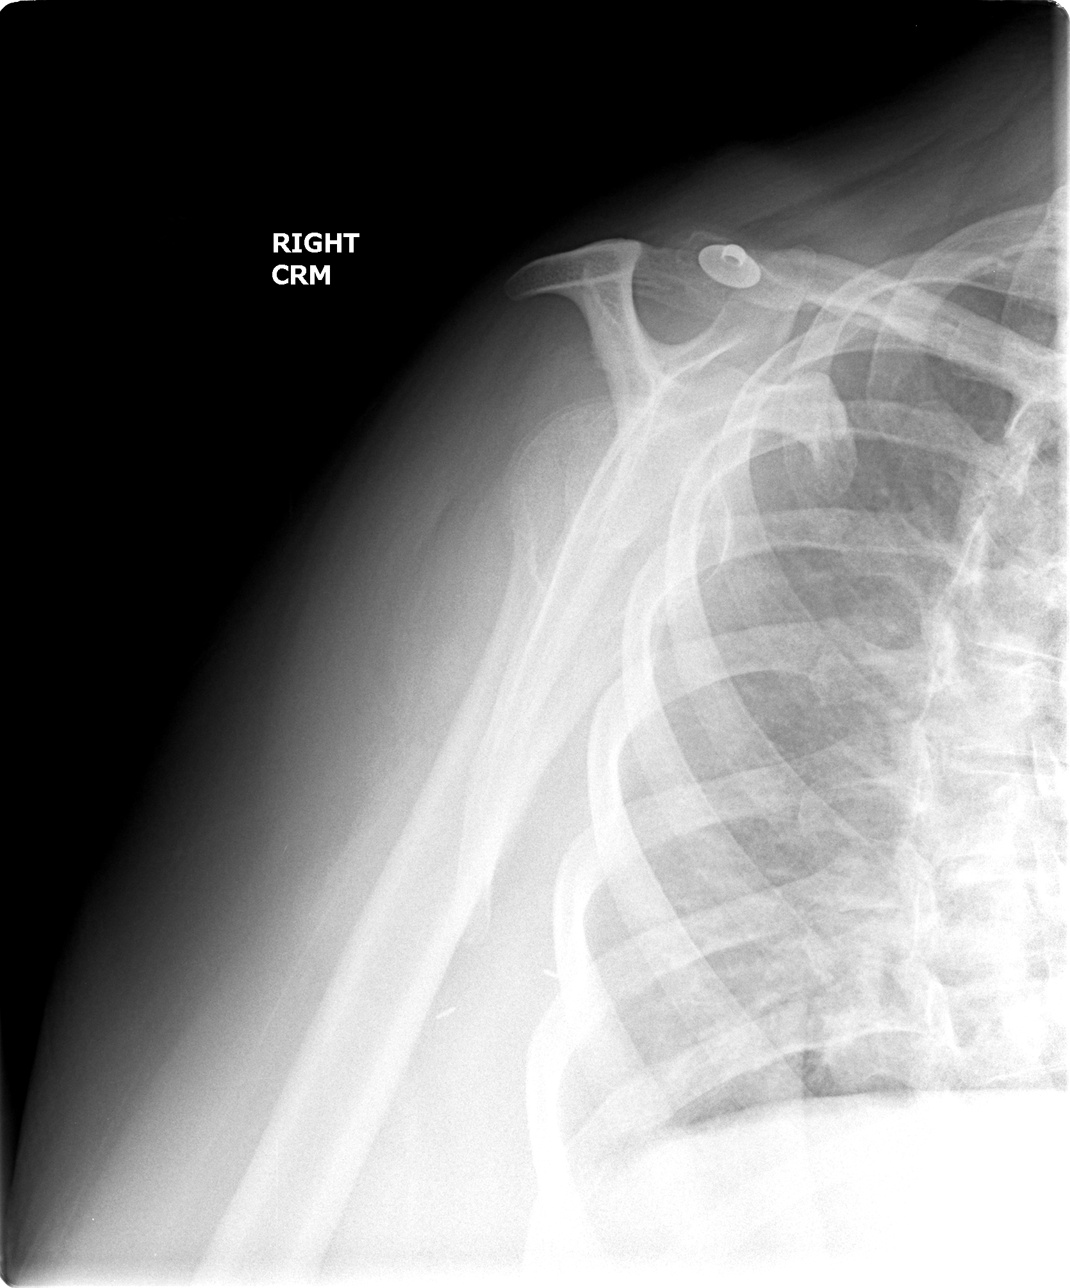

[view not recorded (4 of 4)]
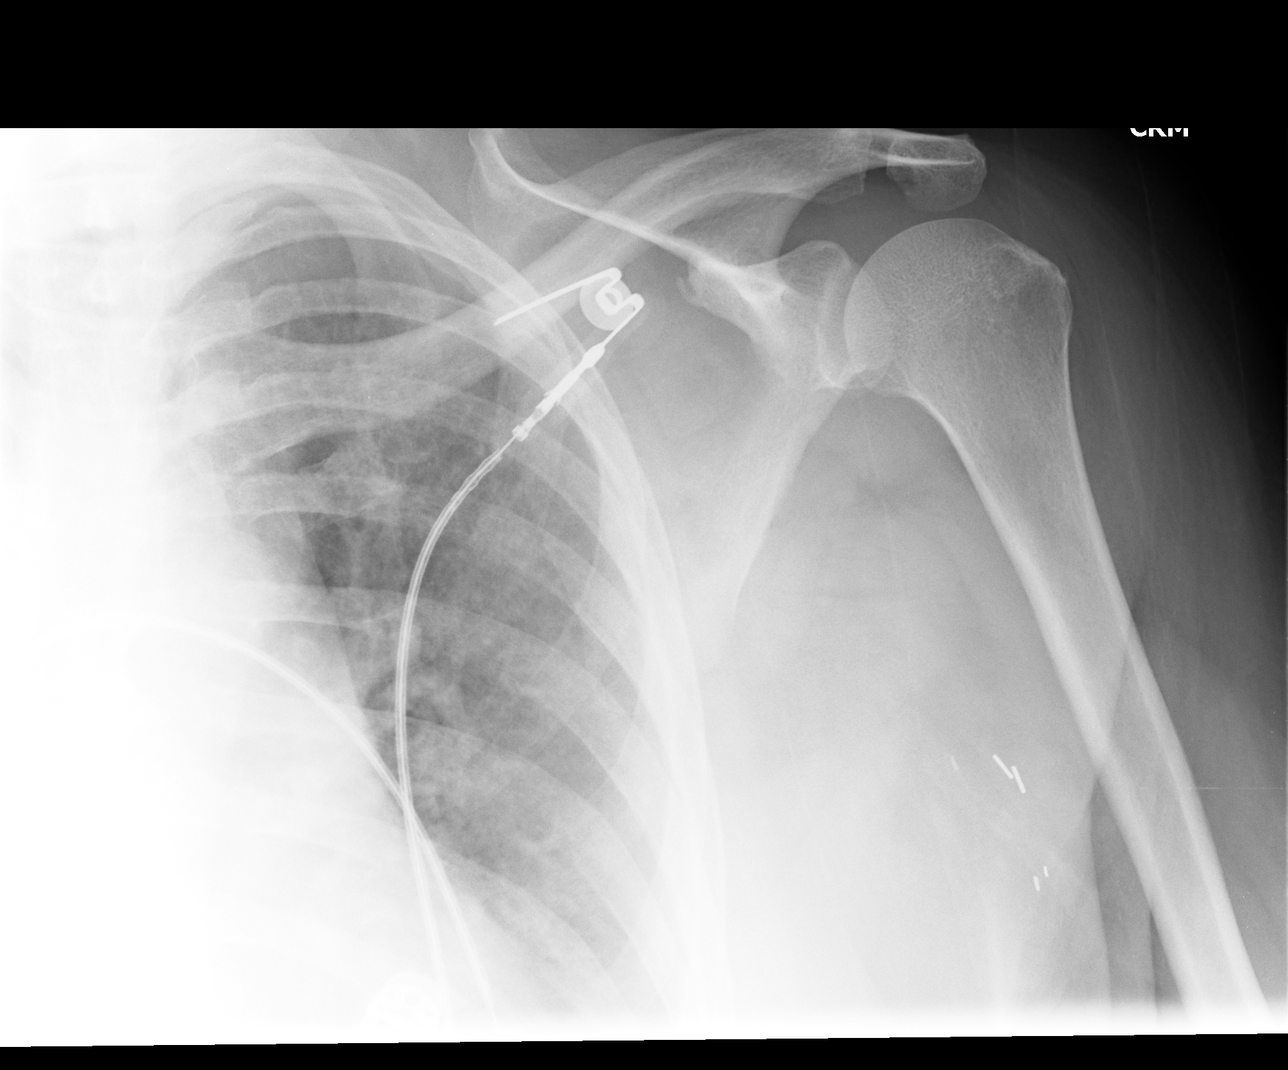

[4 of 4 positions shown; findings below may reference images not displayed]

FINDINGS: A single view of the left shoulder is obtained for comparison at the radiologist?s request.  
 There is prominent subacromial space.  The humeral head appears better positioned over the glenoid on the external rotation than on the internal rotation.  Y-view confirms that the shoulder is located.  Surgical clips are seen within the soft tissues.  SVC stent is noted.
IMPRESSION: 1.  Right shoulder is located.  
 2.  Prominent subacromial space.  There may be expanded joint capsule. 
 3.  Postoperative changes of the upper extremities bilaterally.

## 2008-01-21 IMAGING — CR DG CHEST 2V
2 series · 2 of 2 positions shown · non-contrast
Comparison: none

CLINICAL DATA: Status post dialysis.  Evaluate for pneumonia.  
 CHEST - 2 VIEW:

[view not recorded (1 of 2)]
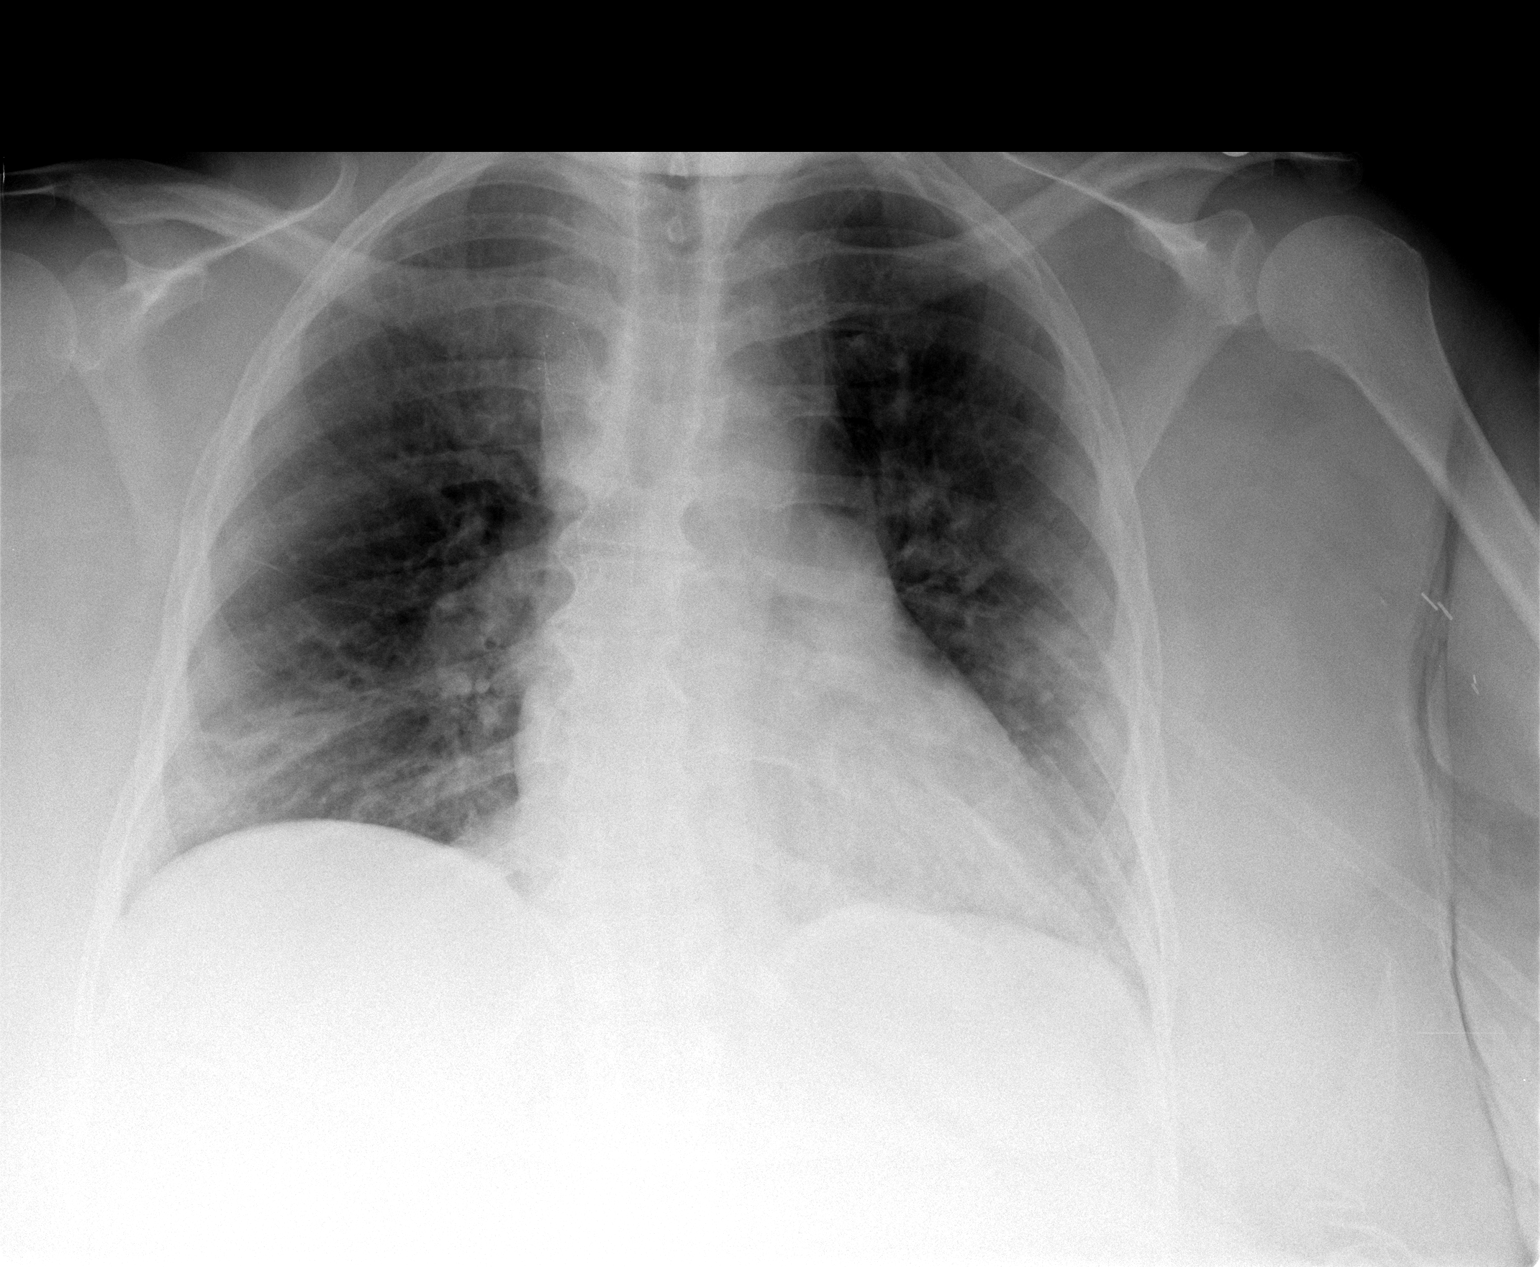

[view not recorded (2 of 2)]
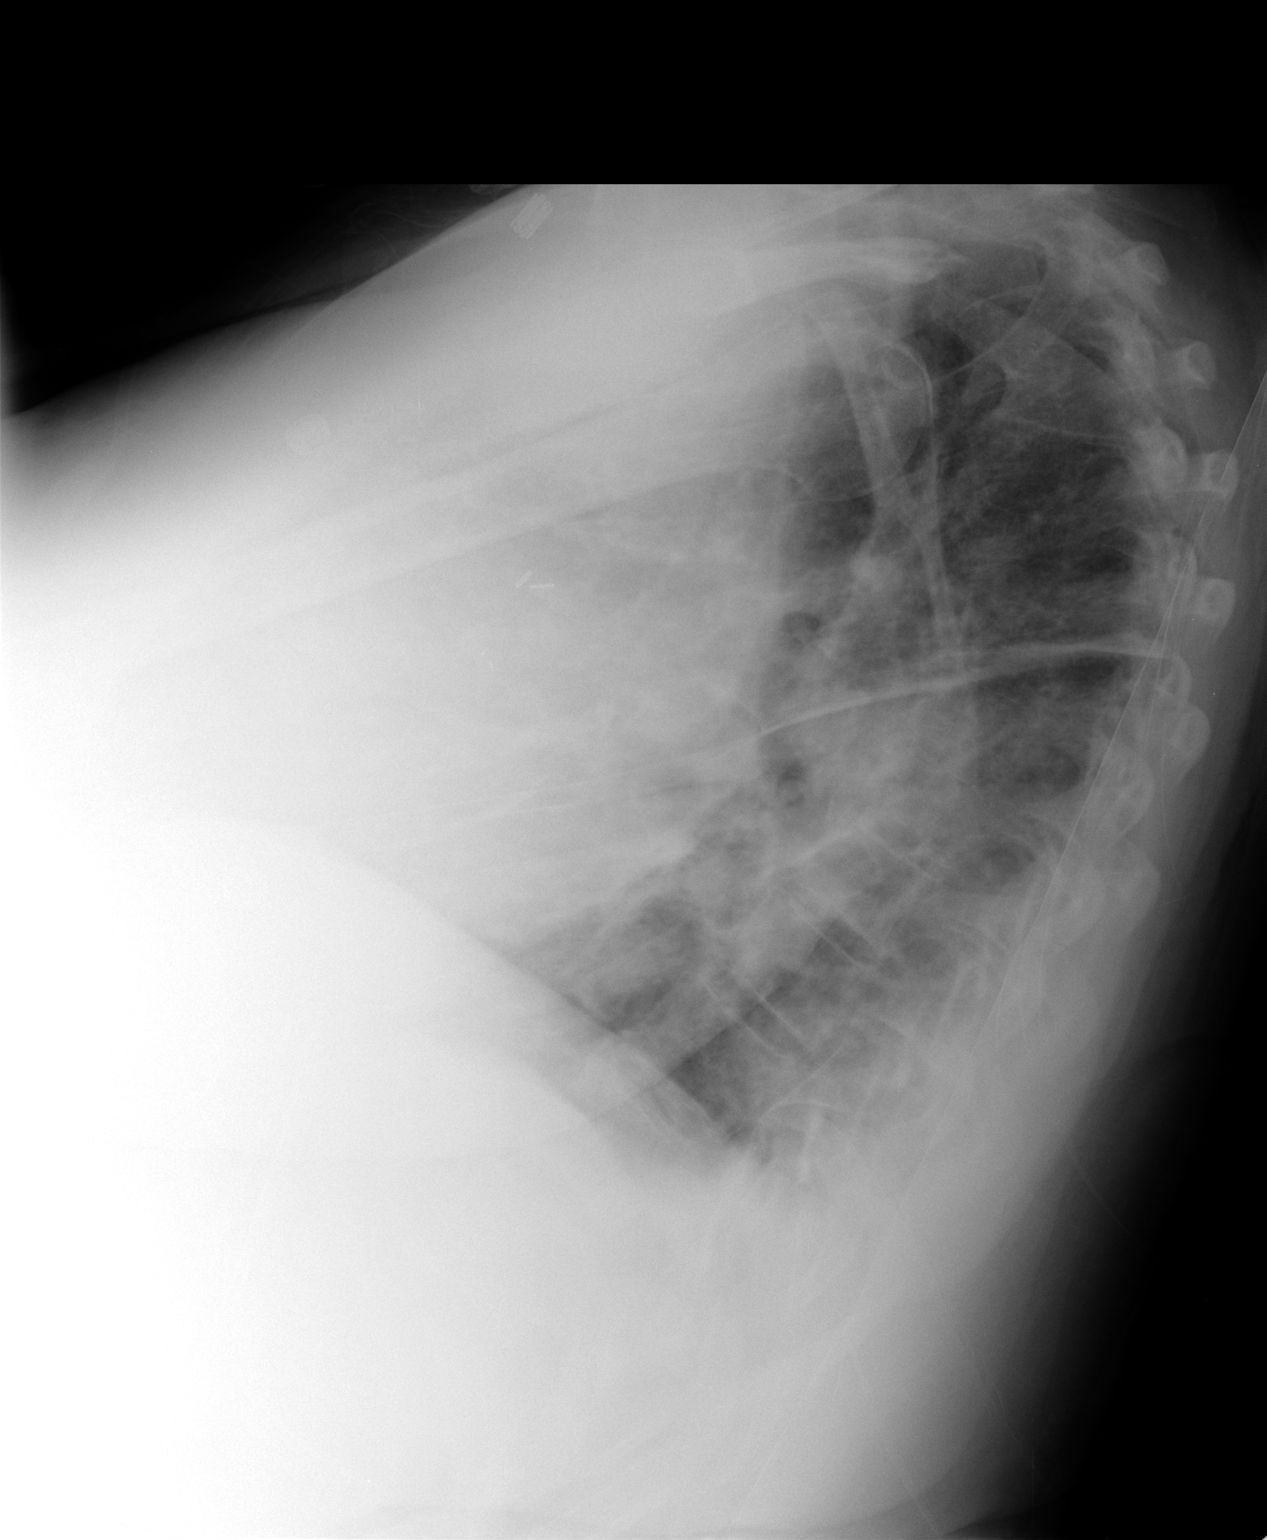

[2 of 2 positions shown; findings below may reference images not displayed]

FINDINGS: Cardiomegaly and pulmonary edema is unchanged.  Small effusions are seen on the lateral radiograph in the costophrenic angle posteriorly.  There are patchy areas of airspace disease within the RIGHT base and within the LEFT mid lung.
IMPRESSION: As discussed above.

## 2008-01-21 IMAGING — CR DG CHEST 2V
2 series · 2 of 2 positions shown · non-contrast
Comparison: 11/01/05.

CLINICAL DATA: Fever and weakness.  
 CHEST - 2 VIEW:

[w chest pa]
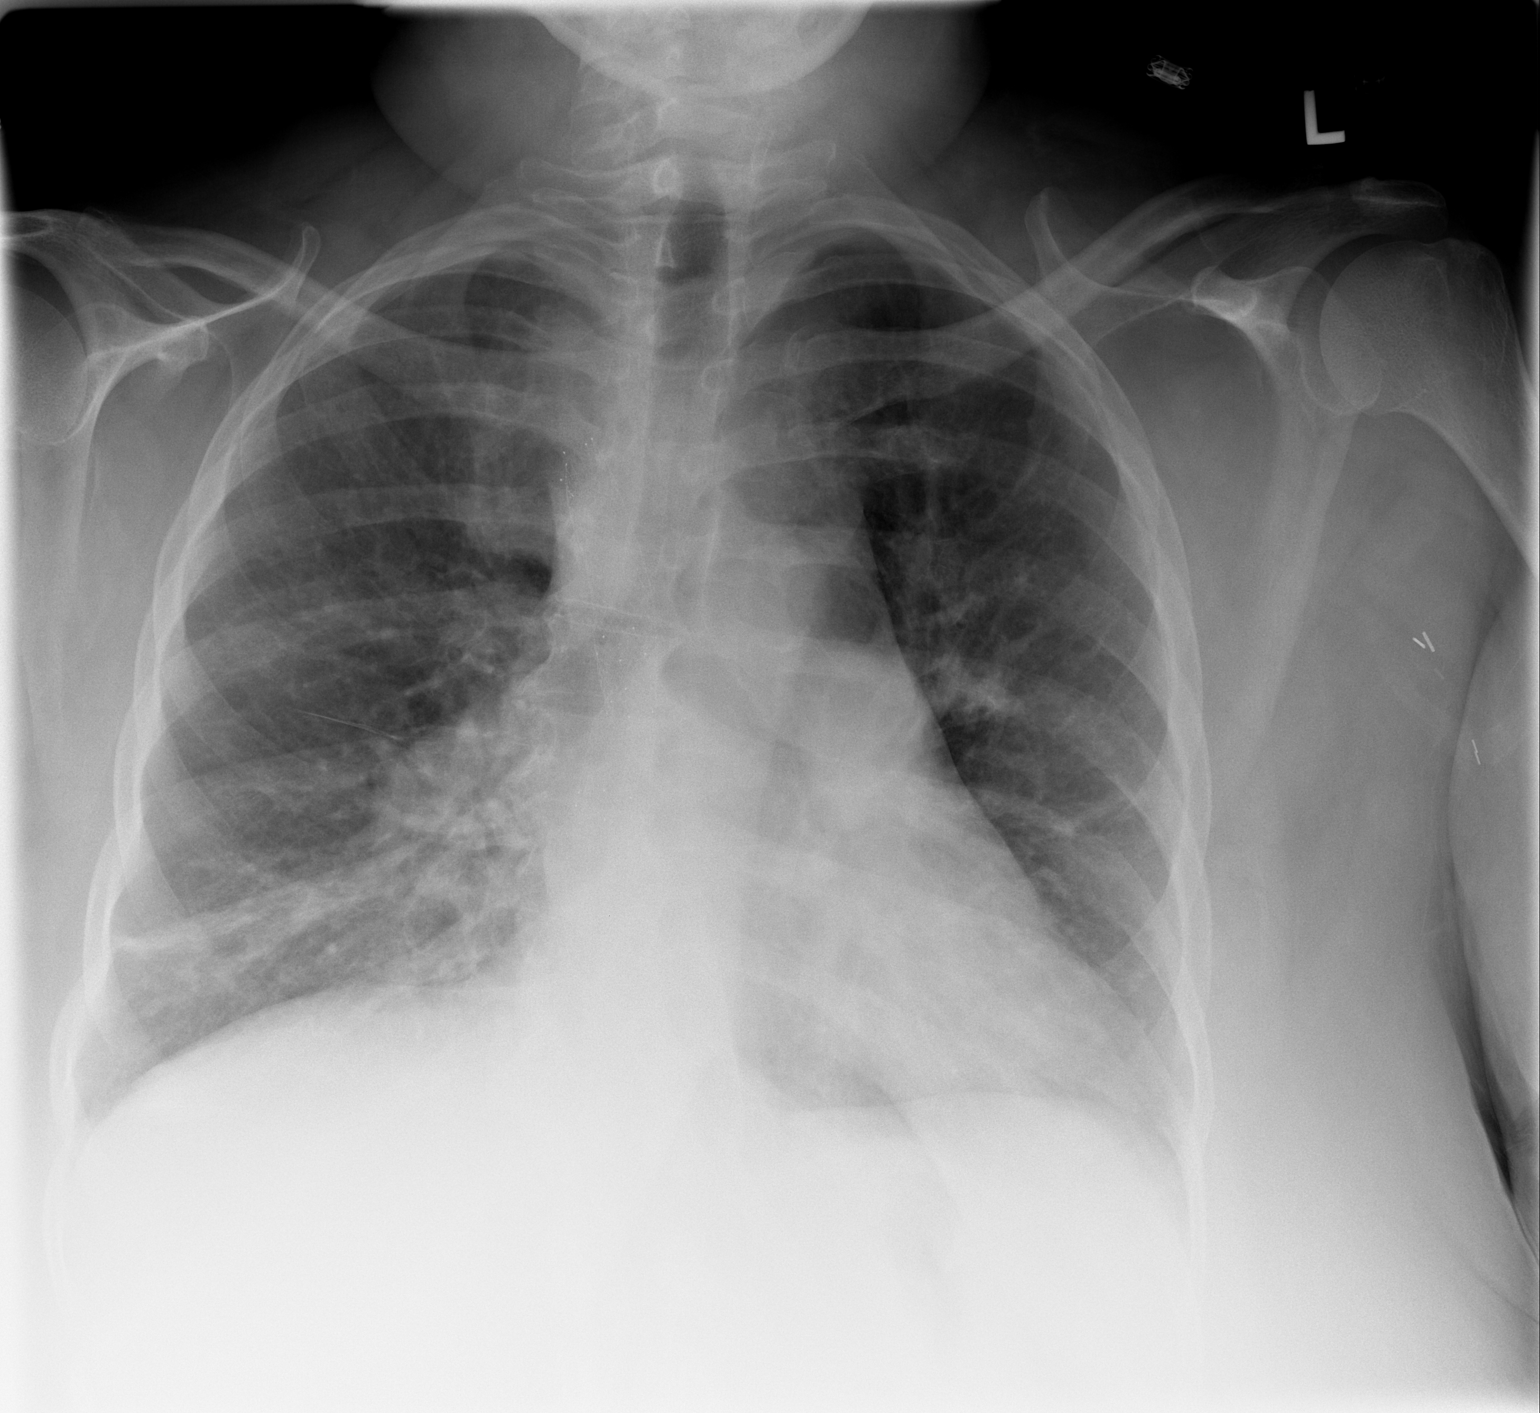

[w chest lat *]
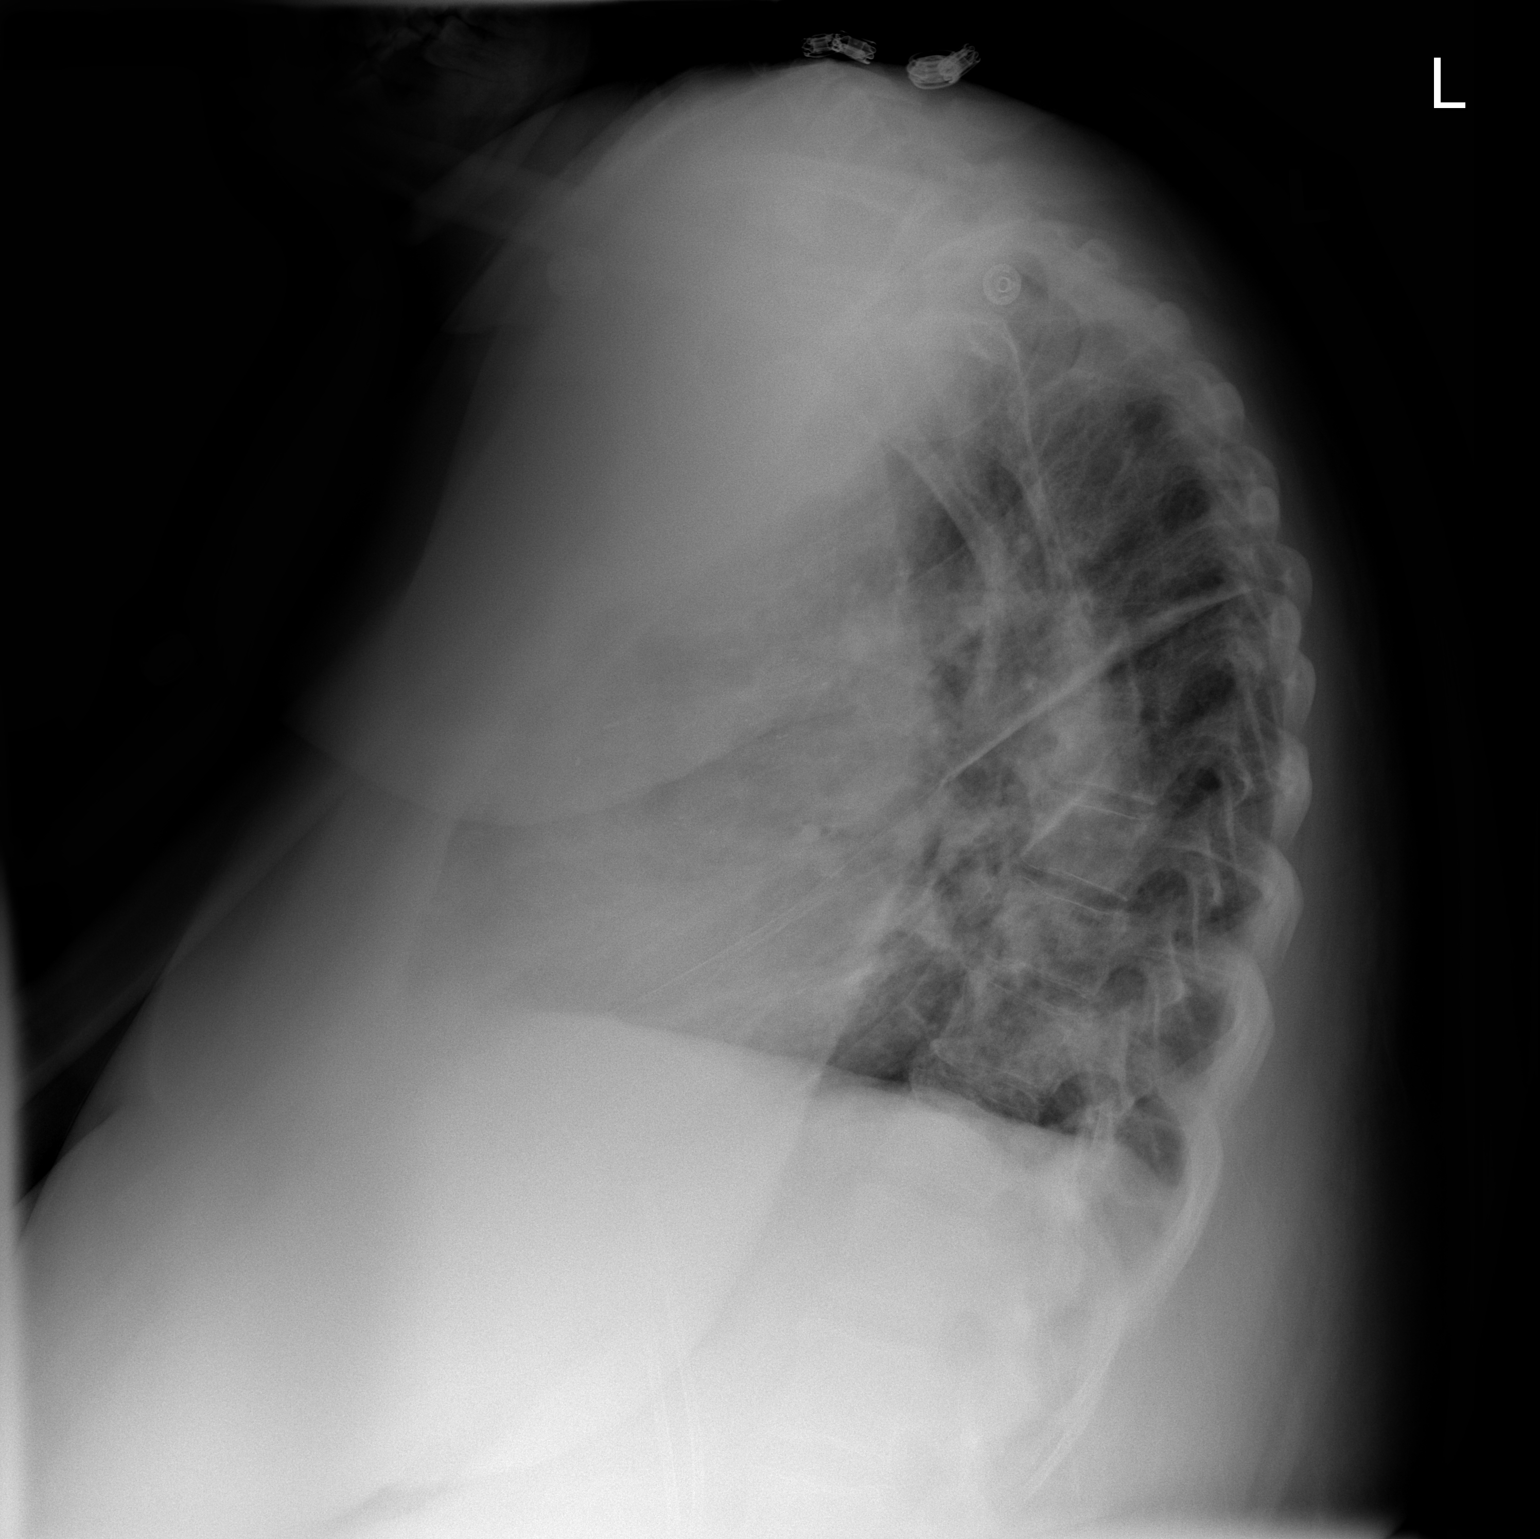

[2 of 2 positions shown; findings below may reference images not displayed]

FINDINGS: Stent is seen within the superior vena cava.  
 Heart size is normal.  There is mild interstitial edema.  Patchy bilateral areas of atelectasis are also noted.
IMPRESSION: 1.  Mild edema.
 2.  Patchy areas of atelectasis bilaterally.

## 2008-01-24 IMAGING — CR DG CHEST 2V
2 series · 2 of 2 positions shown · non-contrast
Comparison: 09/09/06.

CLINICAL DATA: CHF and pneumonia.
 CHEST - 2 VIEW:

[view not recorded (1 of 2)]
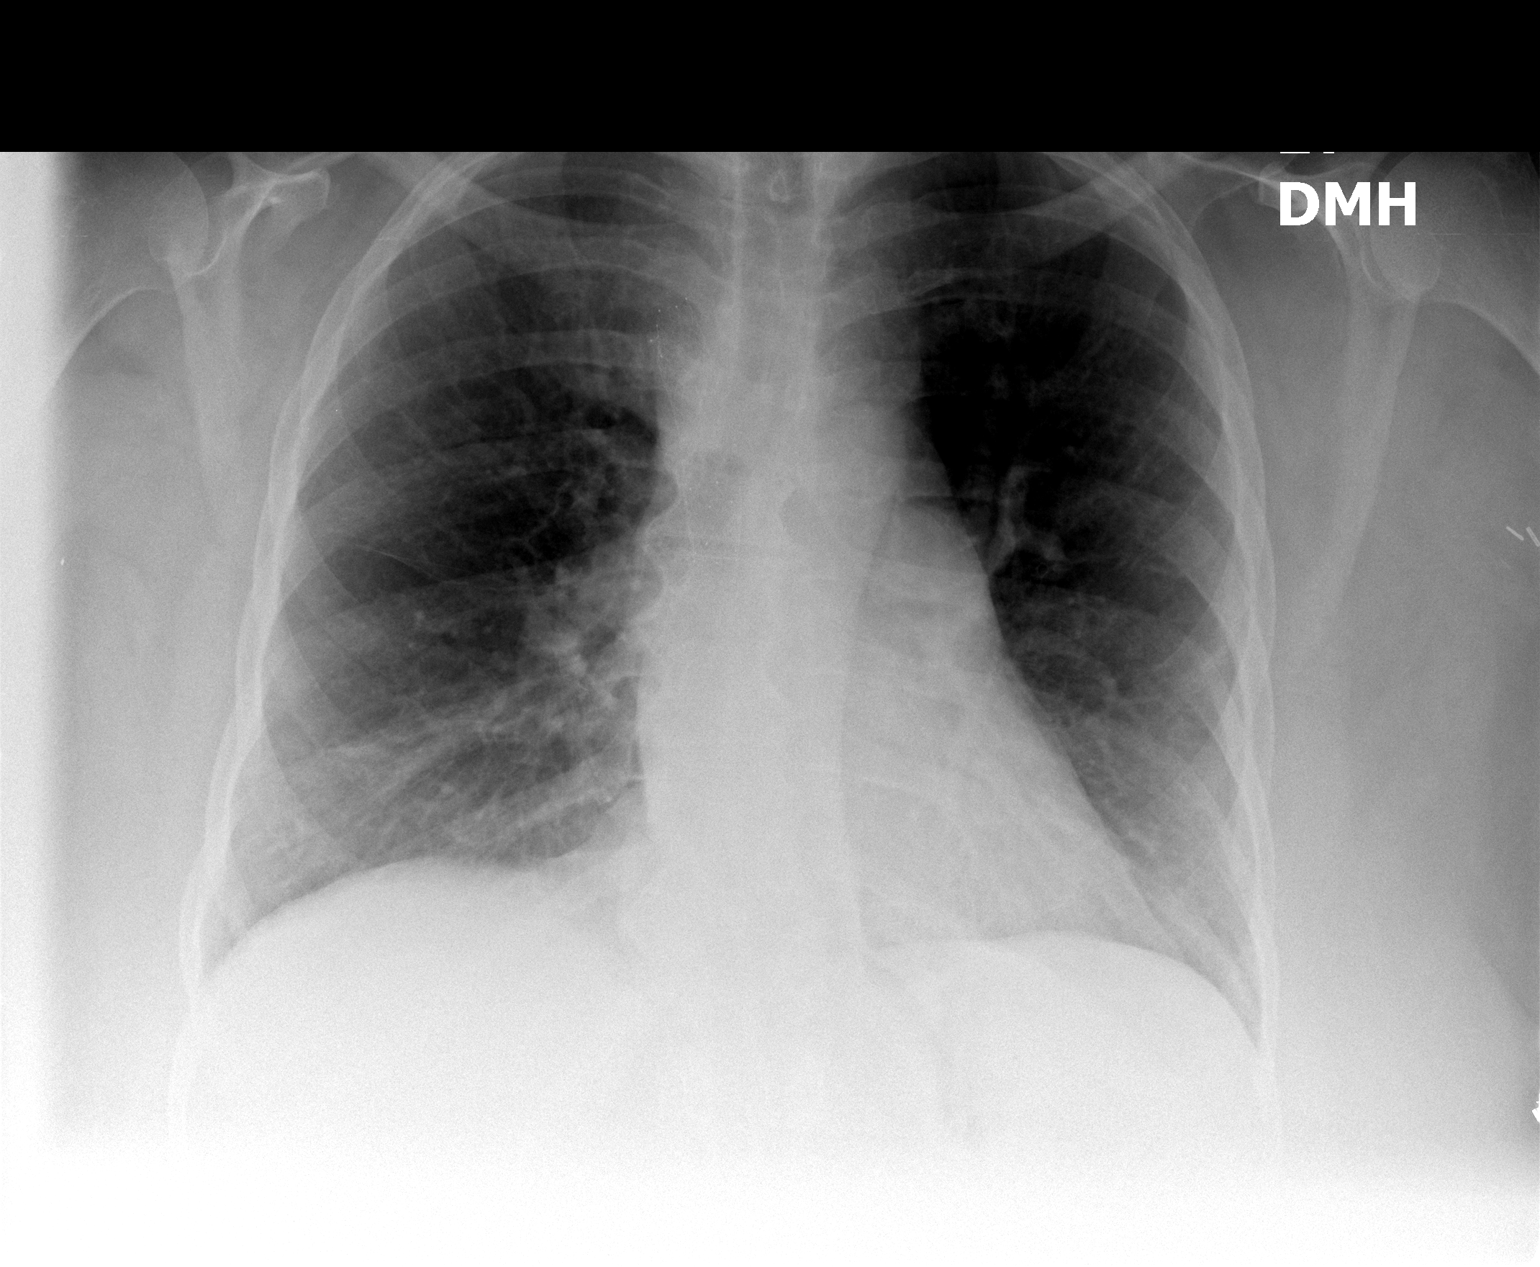

[view not recorded (2 of 2)]
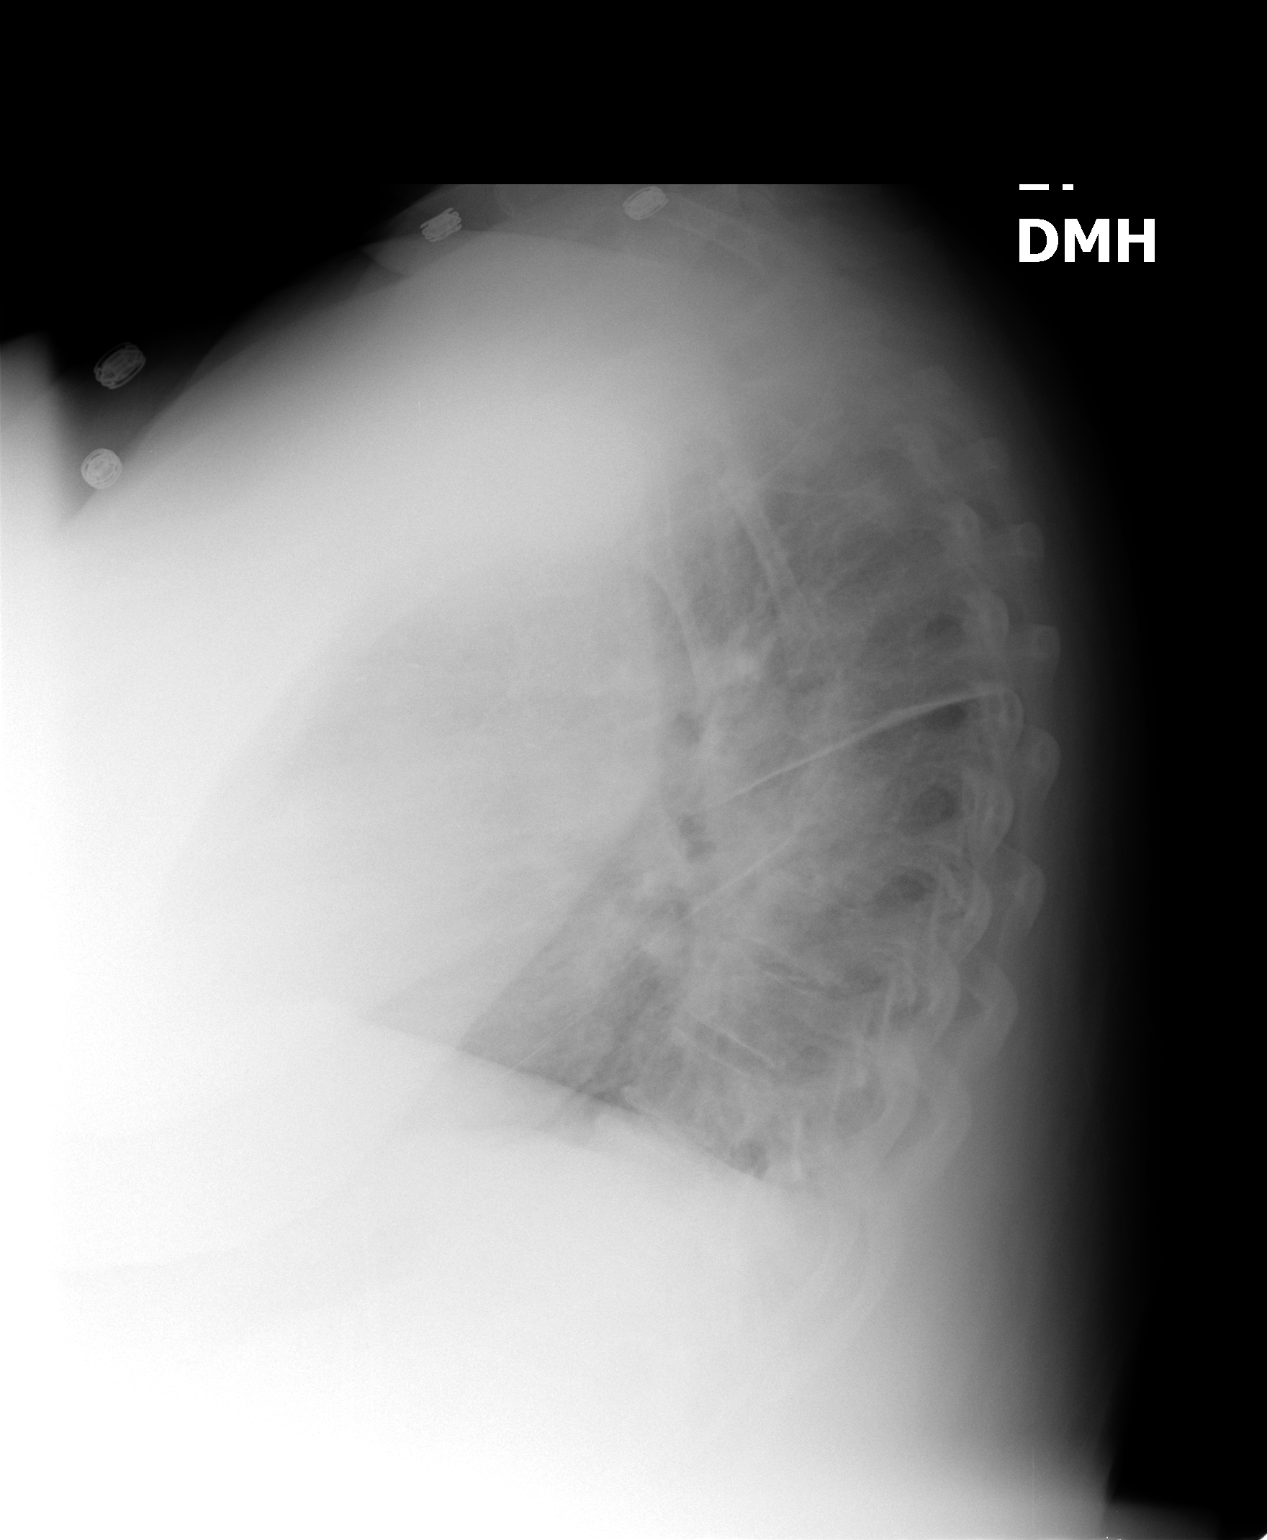

[2 of 2 positions shown; findings below may reference images not displayed]

FINDINGS: Heart size is mildly enlarged.  The mild pulmonary edema is unchanged.  There is improved atelectasis to the left mid lung and right base.
IMPRESSION: 1.  Improving bilateral areas of atelectasis.
 2.  Cardiomegaly. Mild edema.

## 2008-01-28 IMAGING — CR DG CHEST 1V PORT
1 series · 1 of 1 positions shown · non-contrast
Comparison: 09/12/06.

CLINICAL DATA: Pneumonia.  Femoral Diatek placement.
PORTABLE CHEST ? 1 VIEW ? 09/16/06:

[view not recorded]
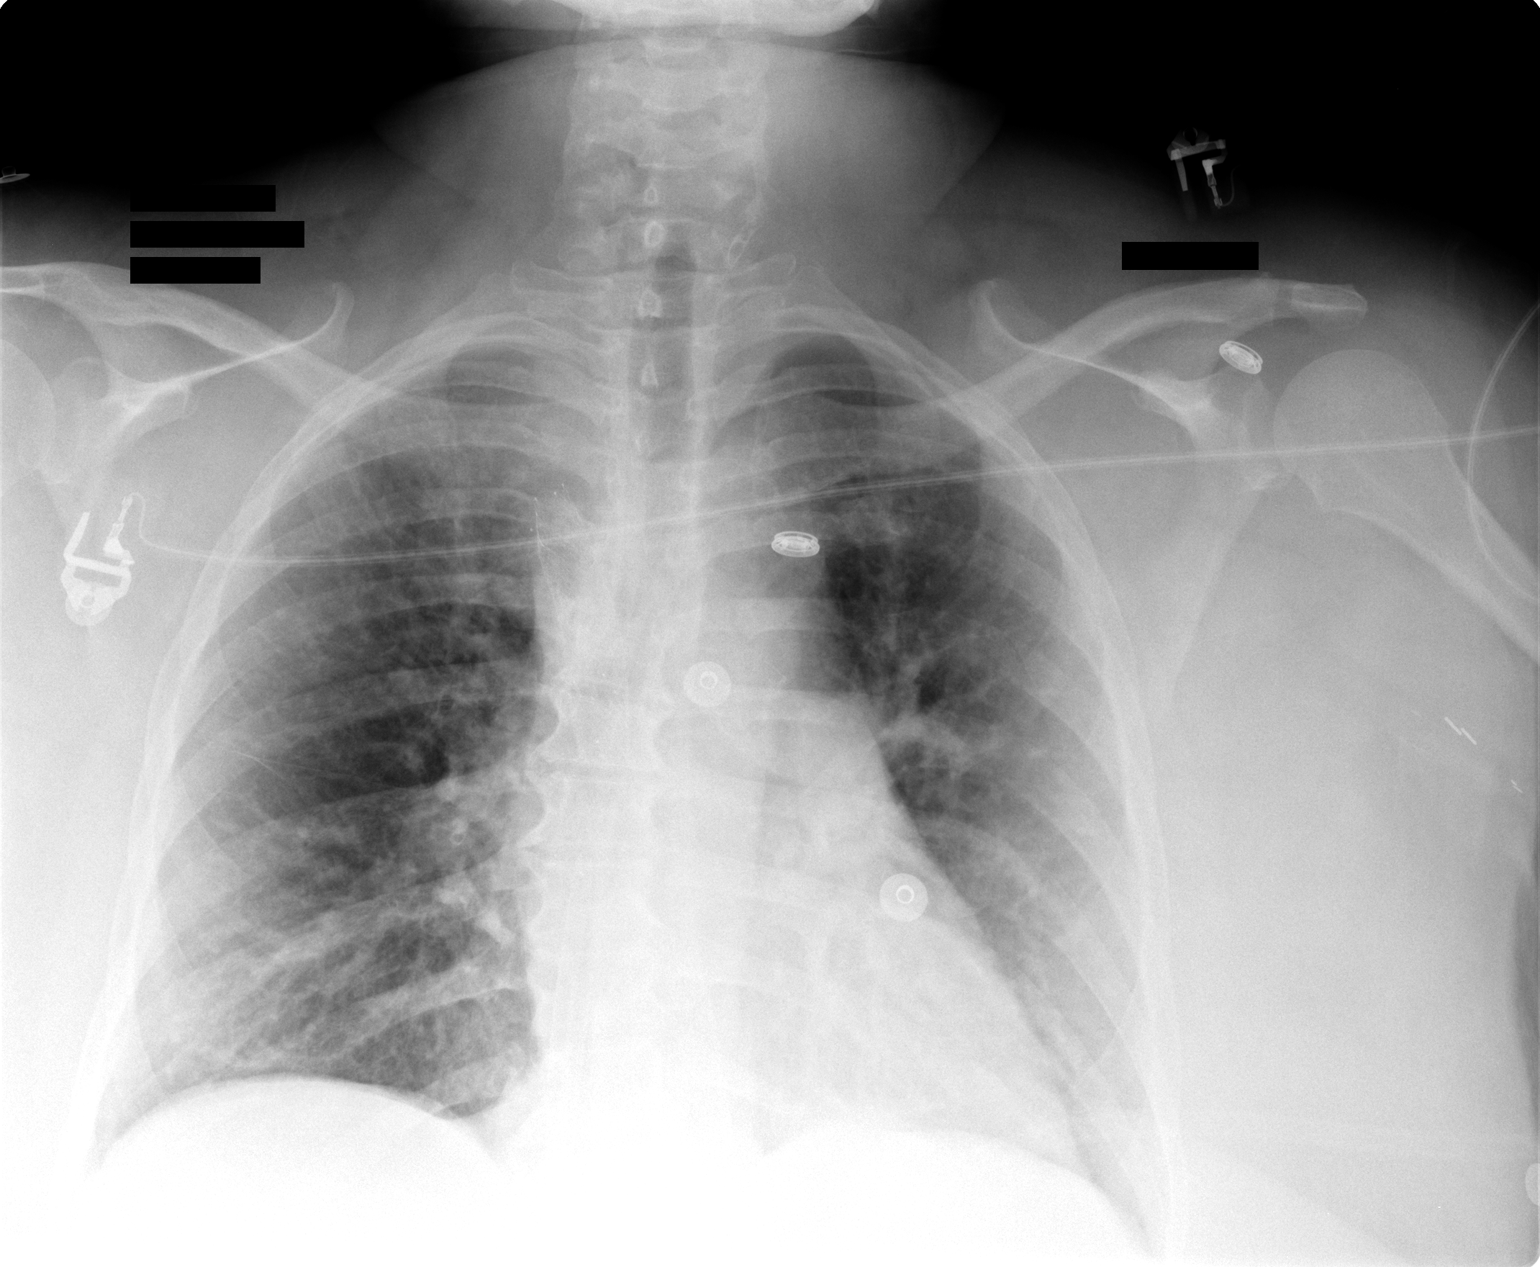

[1 of 1 positions shown; findings below may reference images not displayed]

FINDINGS: Diatek catheter is seen from an IVC approach with tips terminating in the right atrium and near the superior cavoatrial junction.  C stent is noted.  Heart size is normal.  Lungs are somewhat low in volume with mild interstitial prominence.
IMPRESSION: Mild interstitial prominence may be due to vascular crowding, although mild pulmonary edema is a consideration.

## 2008-01-28 IMAGING — US US PELVIS COMPLETE MODIFY
1 series · 14 of 25 positions shown · non-contrast
Comparison: None

CLINICAL DATA: Bleeding, anemia.
 TRANSABDOMINAL AND TRANSVAGINAL PELVIC ULTRASOUND:
TECHNIQUE: Both transabdominal and transvaginal ultrasound examinations of the pelvis were performed including evaluation of the uterus, ovaries, adnexal regions, and pelvic cul-de-sac.

[Series 1: unknown · 0.35mm/px · 14 of 42 slices shown]
[im 1/42]
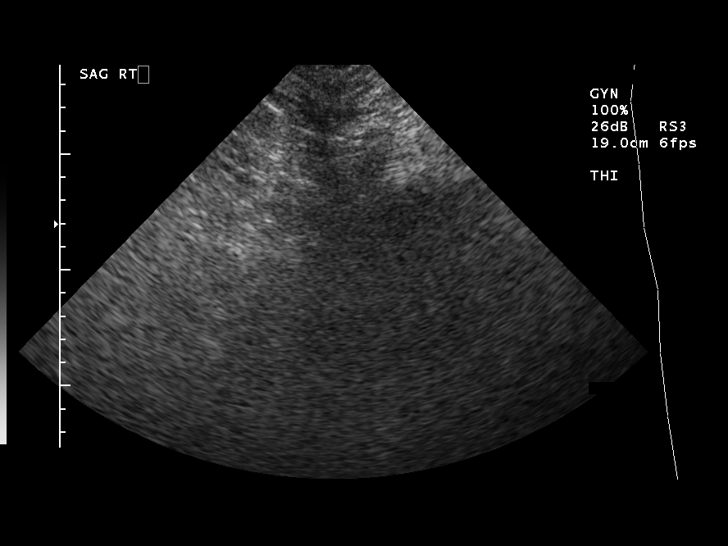
[im 4/42]
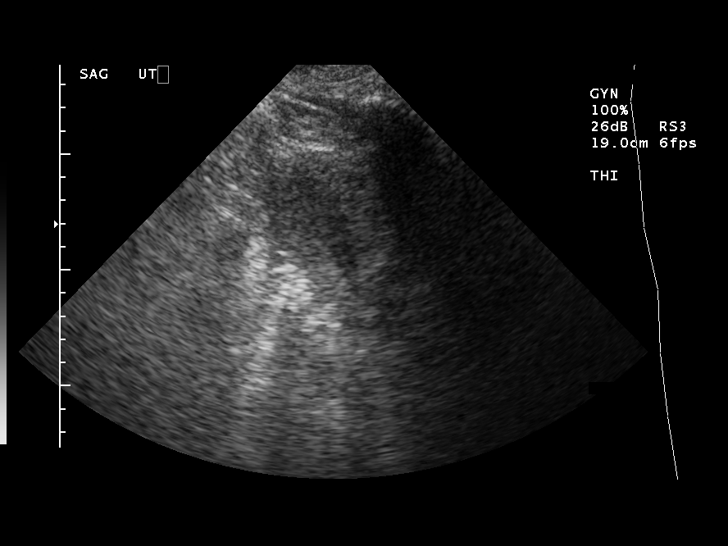
[im 7/42]
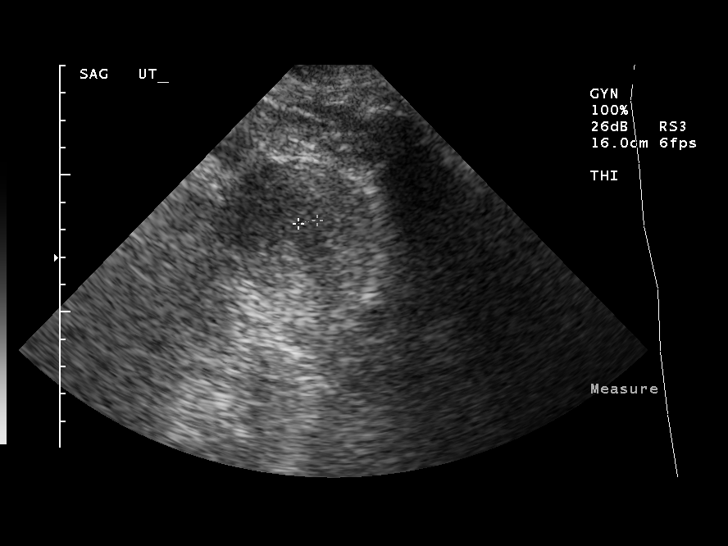
[im 11/42]
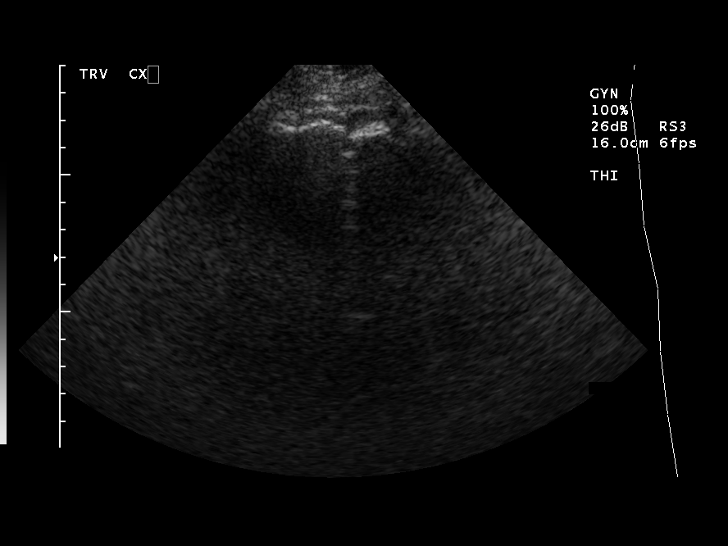
[im 14/42]
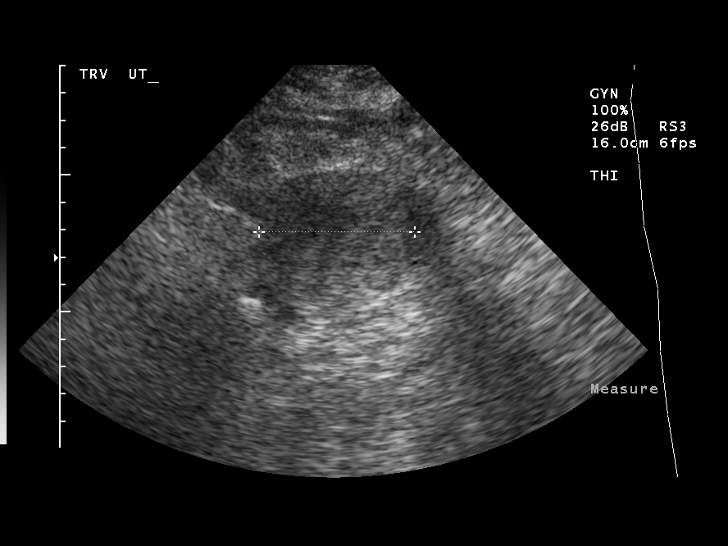
[im 16/42]
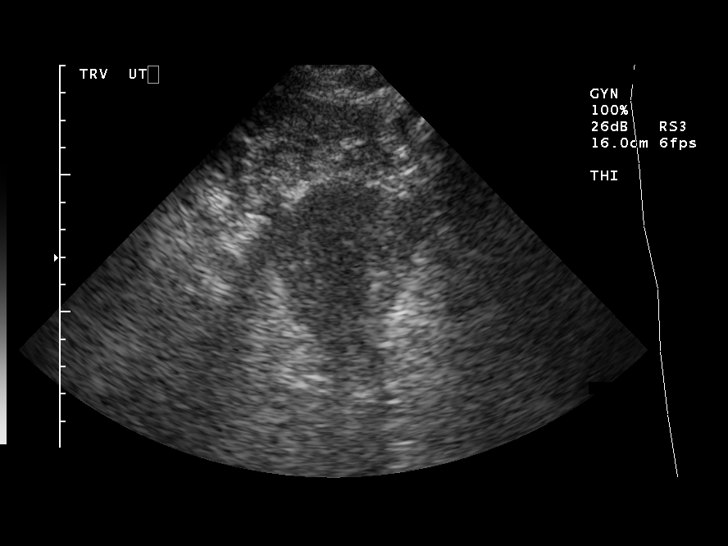
[im 19/42]
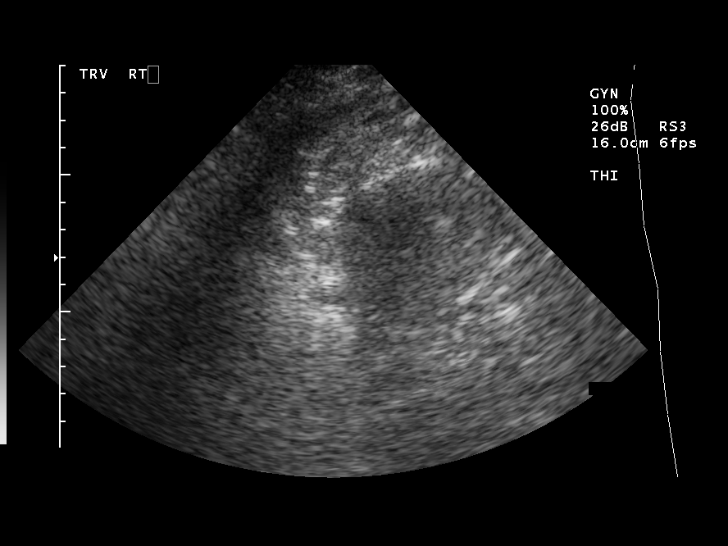
[im 23/42]
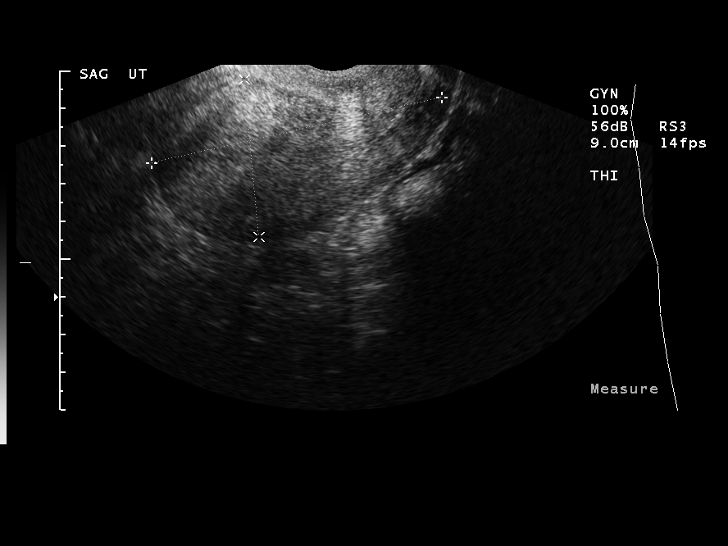
[im 26/42]
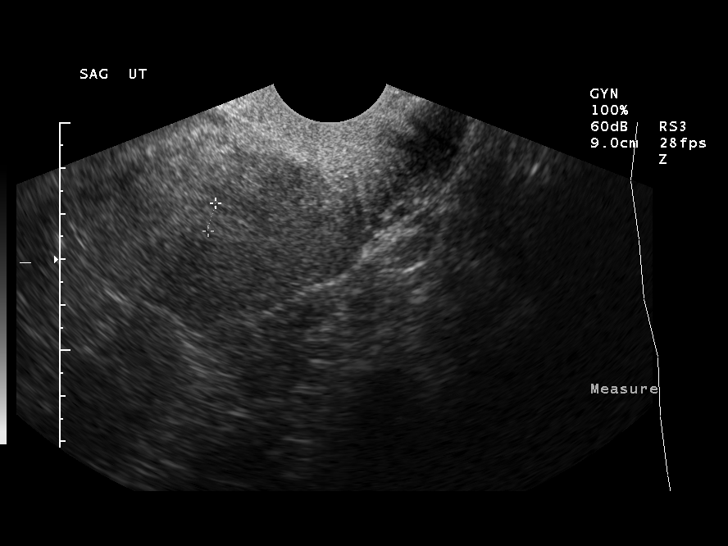
[im 28/42]
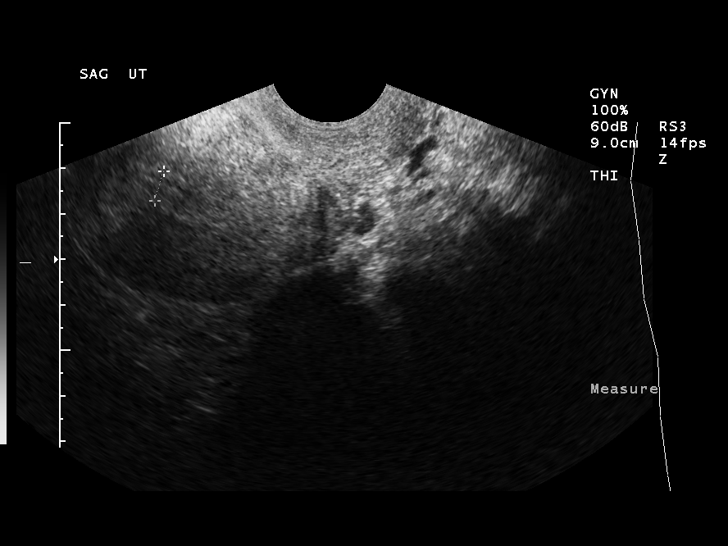
[im 31/42]
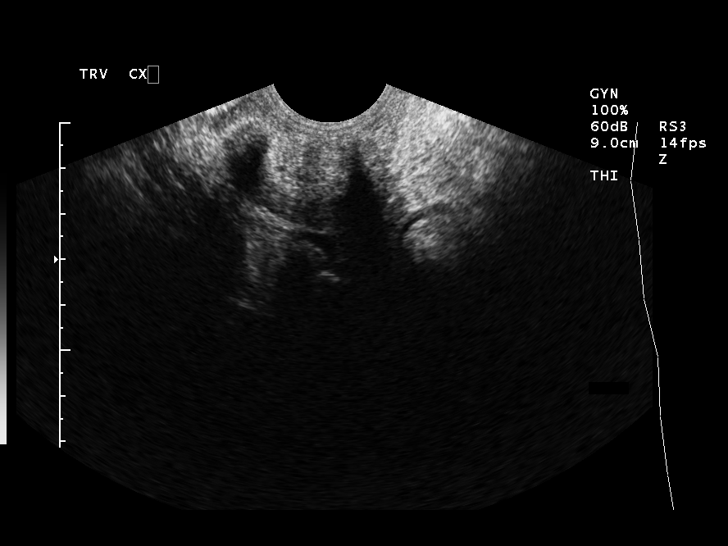
[im 35/42]
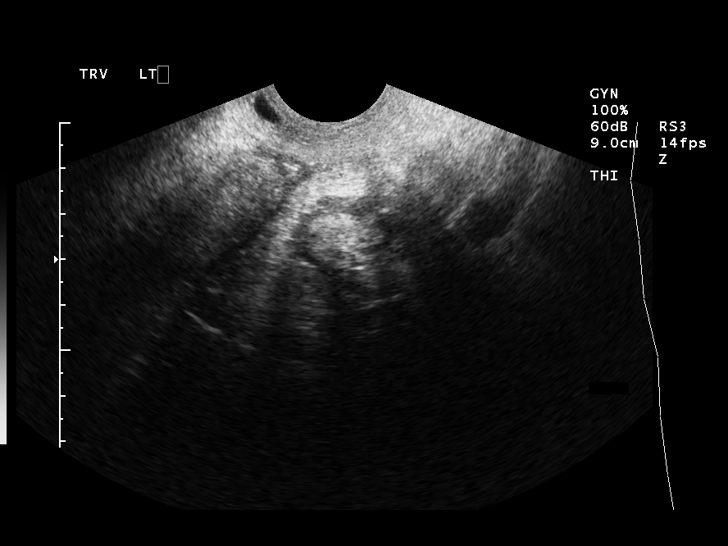
[im 38/42]
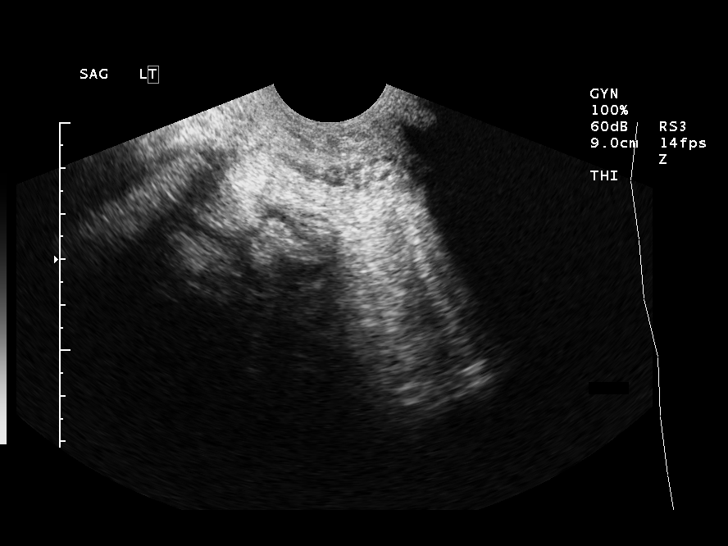
[im 42/42]
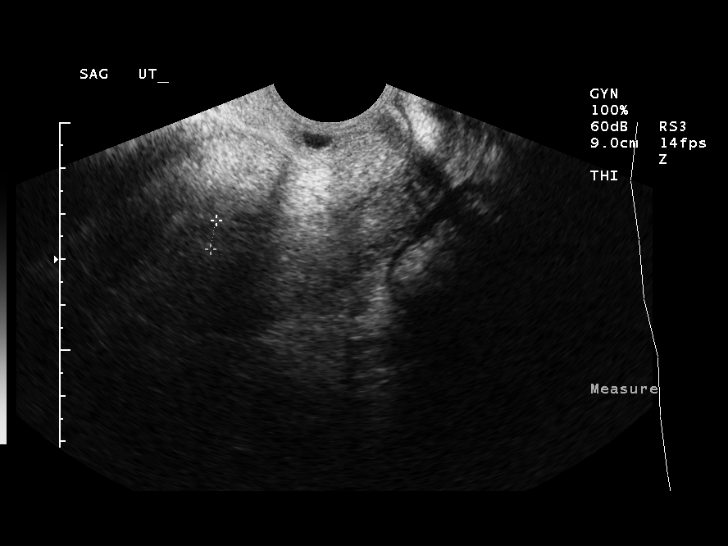

[14 of 25 positions shown; findings below may reference images not displayed]

FINDINGS: The uterus measures 8.6 x 4.9 x 6.4 cm and shows no focal abnormality.  Large patient size does limit scan quality.  The endometrium measures 7 mm and appears normal. The ovaries are not seen.  There is no free fluid.
IMPRESSION: 1.  Limited study due to patient?s size.
 2.  The uterus and endometrium appear normal.  The ovaries are not visualized.

## 2008-03-10 ENCOUNTER — Ambulatory Visit (HOSPITAL_COMMUNITY): Admission: RE | Admit: 2008-03-10 | Discharge: 2008-03-10 | Payer: Self-pay | Admitting: Nephrology

## 2008-04-09 IMAGING — CR DG CHEST 1V PORT
1 series · 1 of 1 positions shown · non-contrast
Comparison: 09/16/06.

CLINICAL DATA: Chills, back pain.  Diabetic. 
 PORTABLE CHEST - 1 VIEW ? 11/27/06:

[view not recorded]
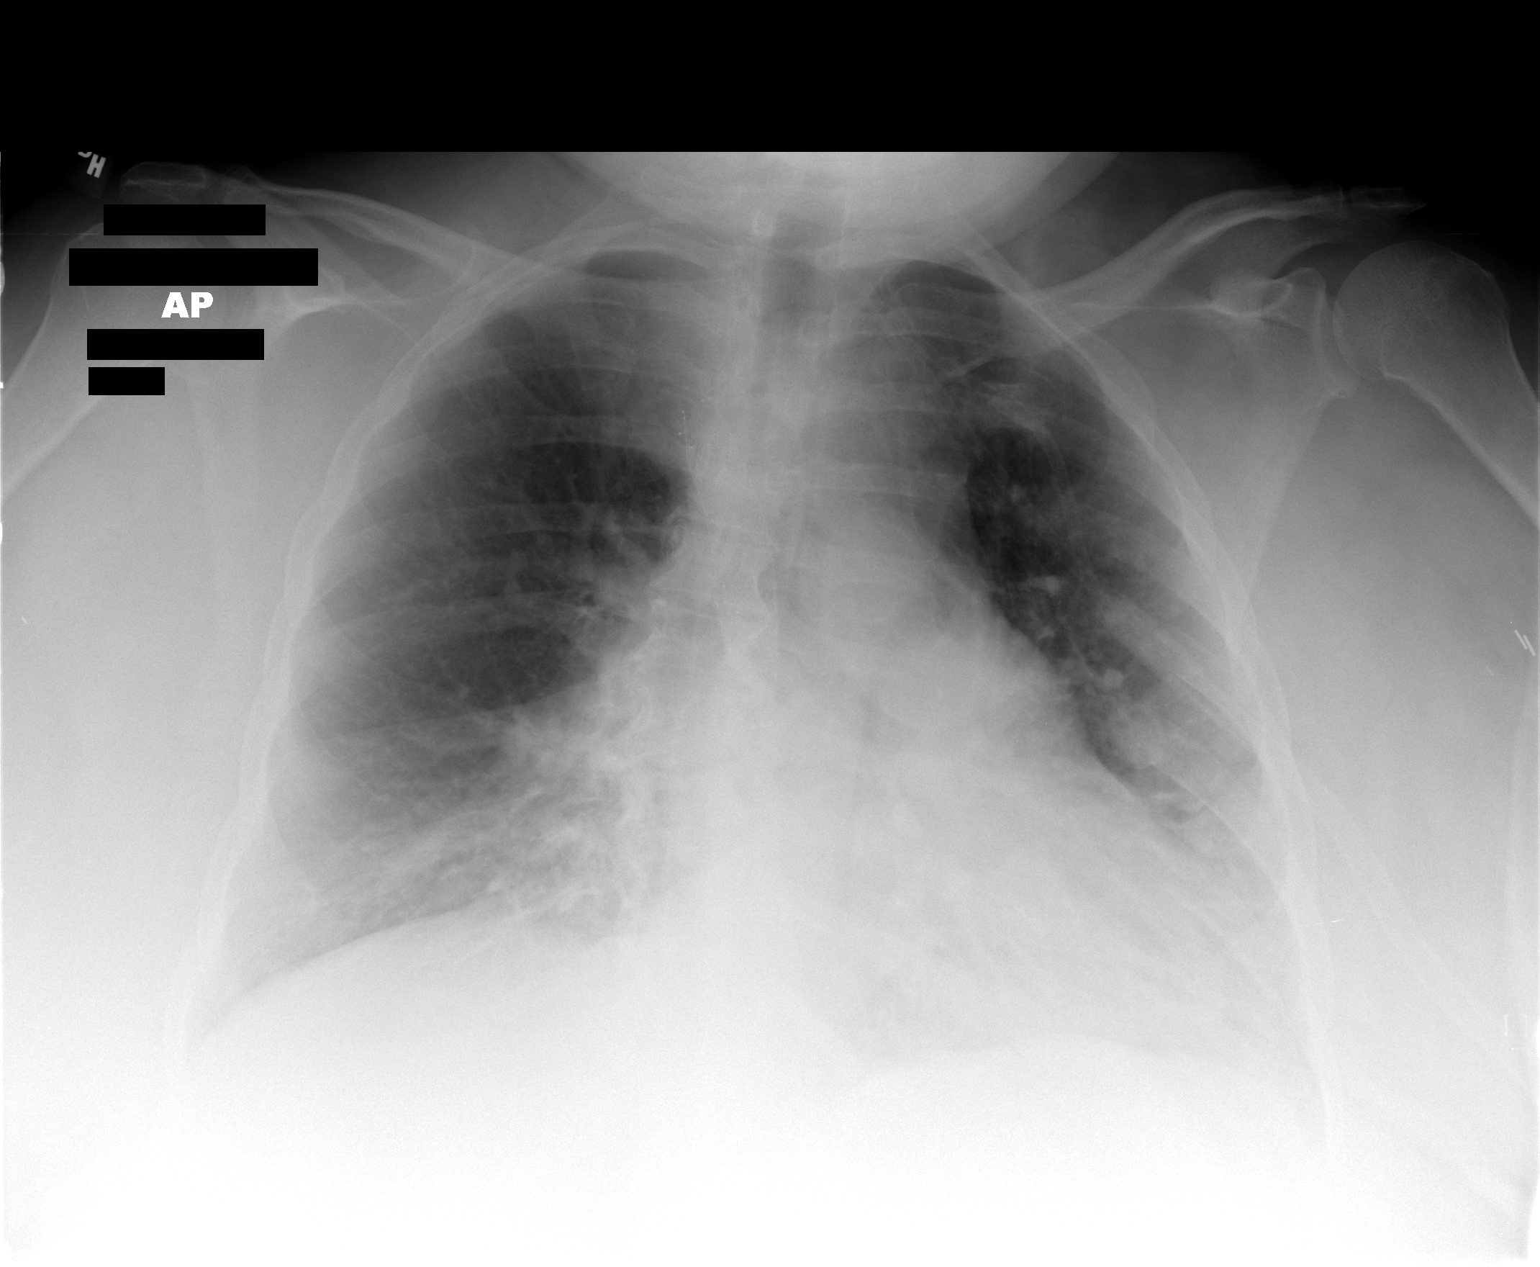

[1 of 1 positions shown; findings below may reference images not displayed]

FINDINGS: Cardiomegaly, pulmonary vascular congestion and bilateral lower lung atelectasis/airspace disease noted.  Pneumonia not entirely excluded.  Consider PA and lateral chest when able. 
 No definite effusion is noted.
IMPRESSION: 1.  Bilateral left lower lung atelectasis/airspace disease. 
 2.  Cardiomegaly and pulmonary vascular congestion

## 2008-04-10 IMAGING — CR DG CHEST 2V
2 series · 2 of 2 positions shown · non-contrast
Comparison: 11/27/2006

CLINICAL DATA: Fever, on dialysis. 
 CHEST - 2 VIEW:

[view not recorded (1 of 2)]
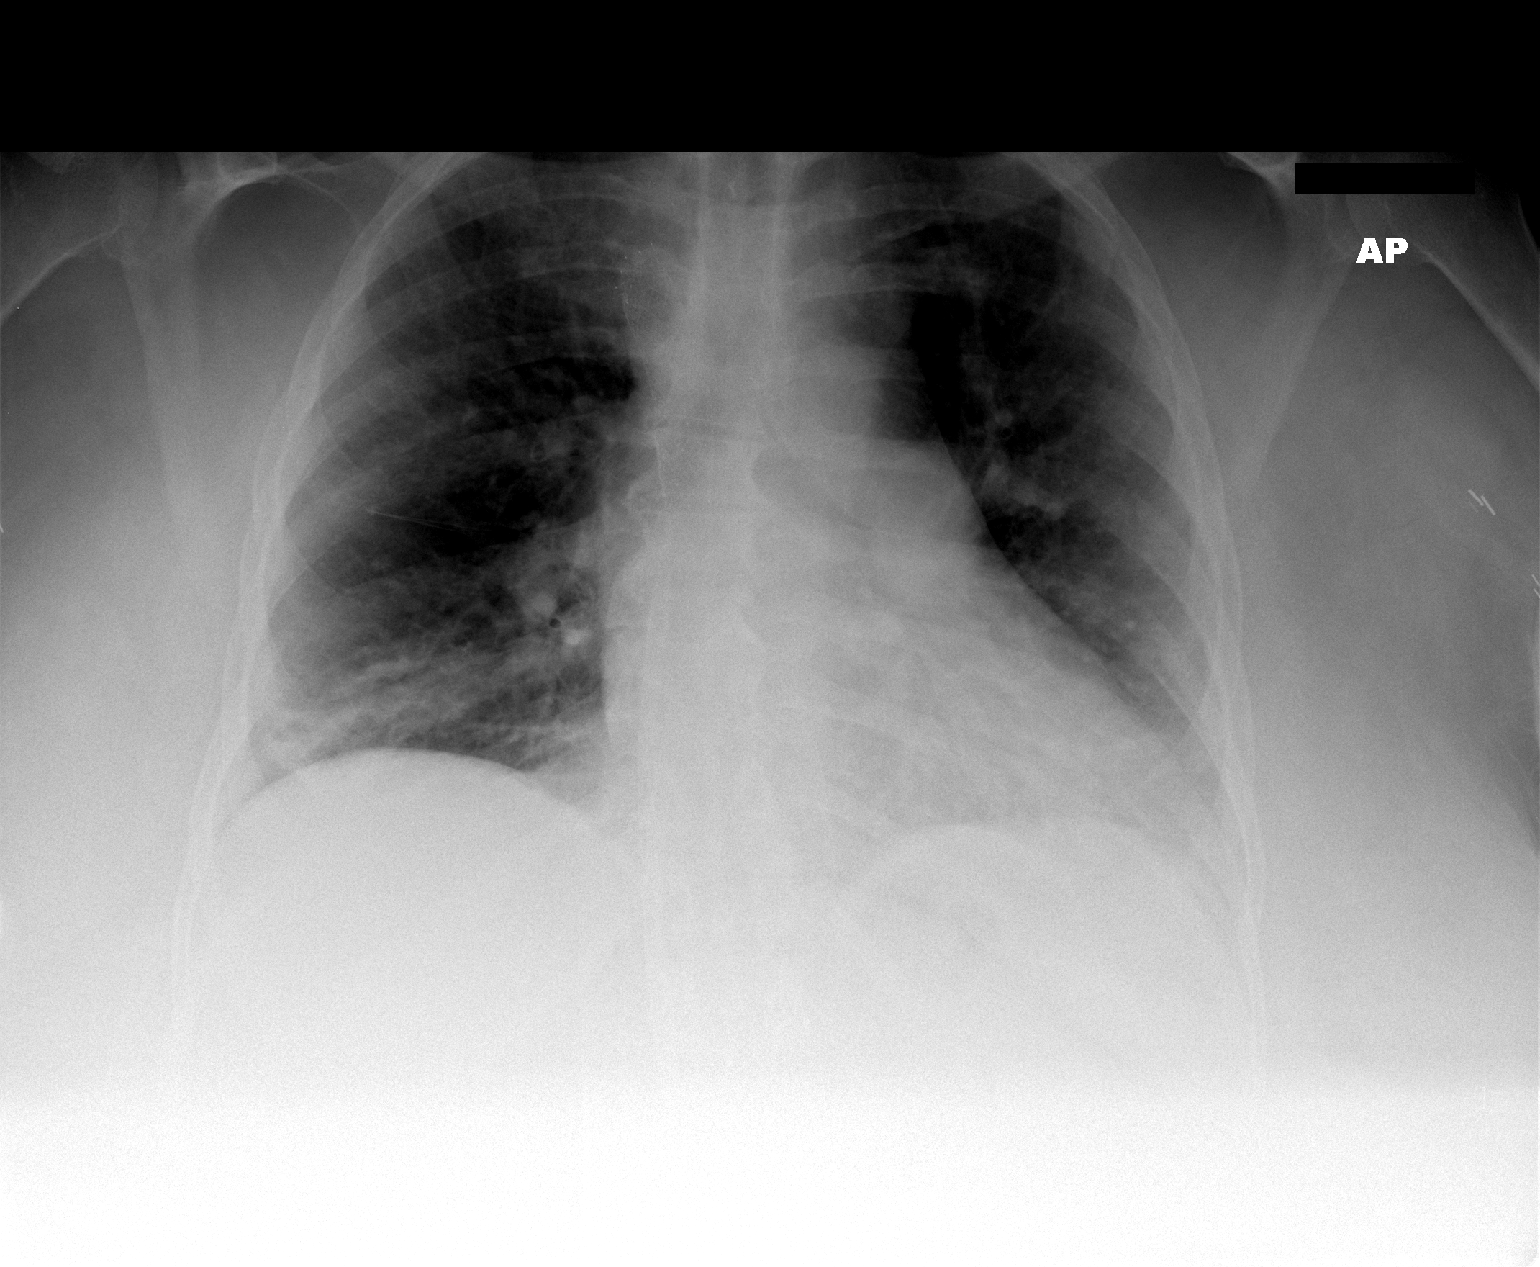

[view not recorded (2 of 2)]
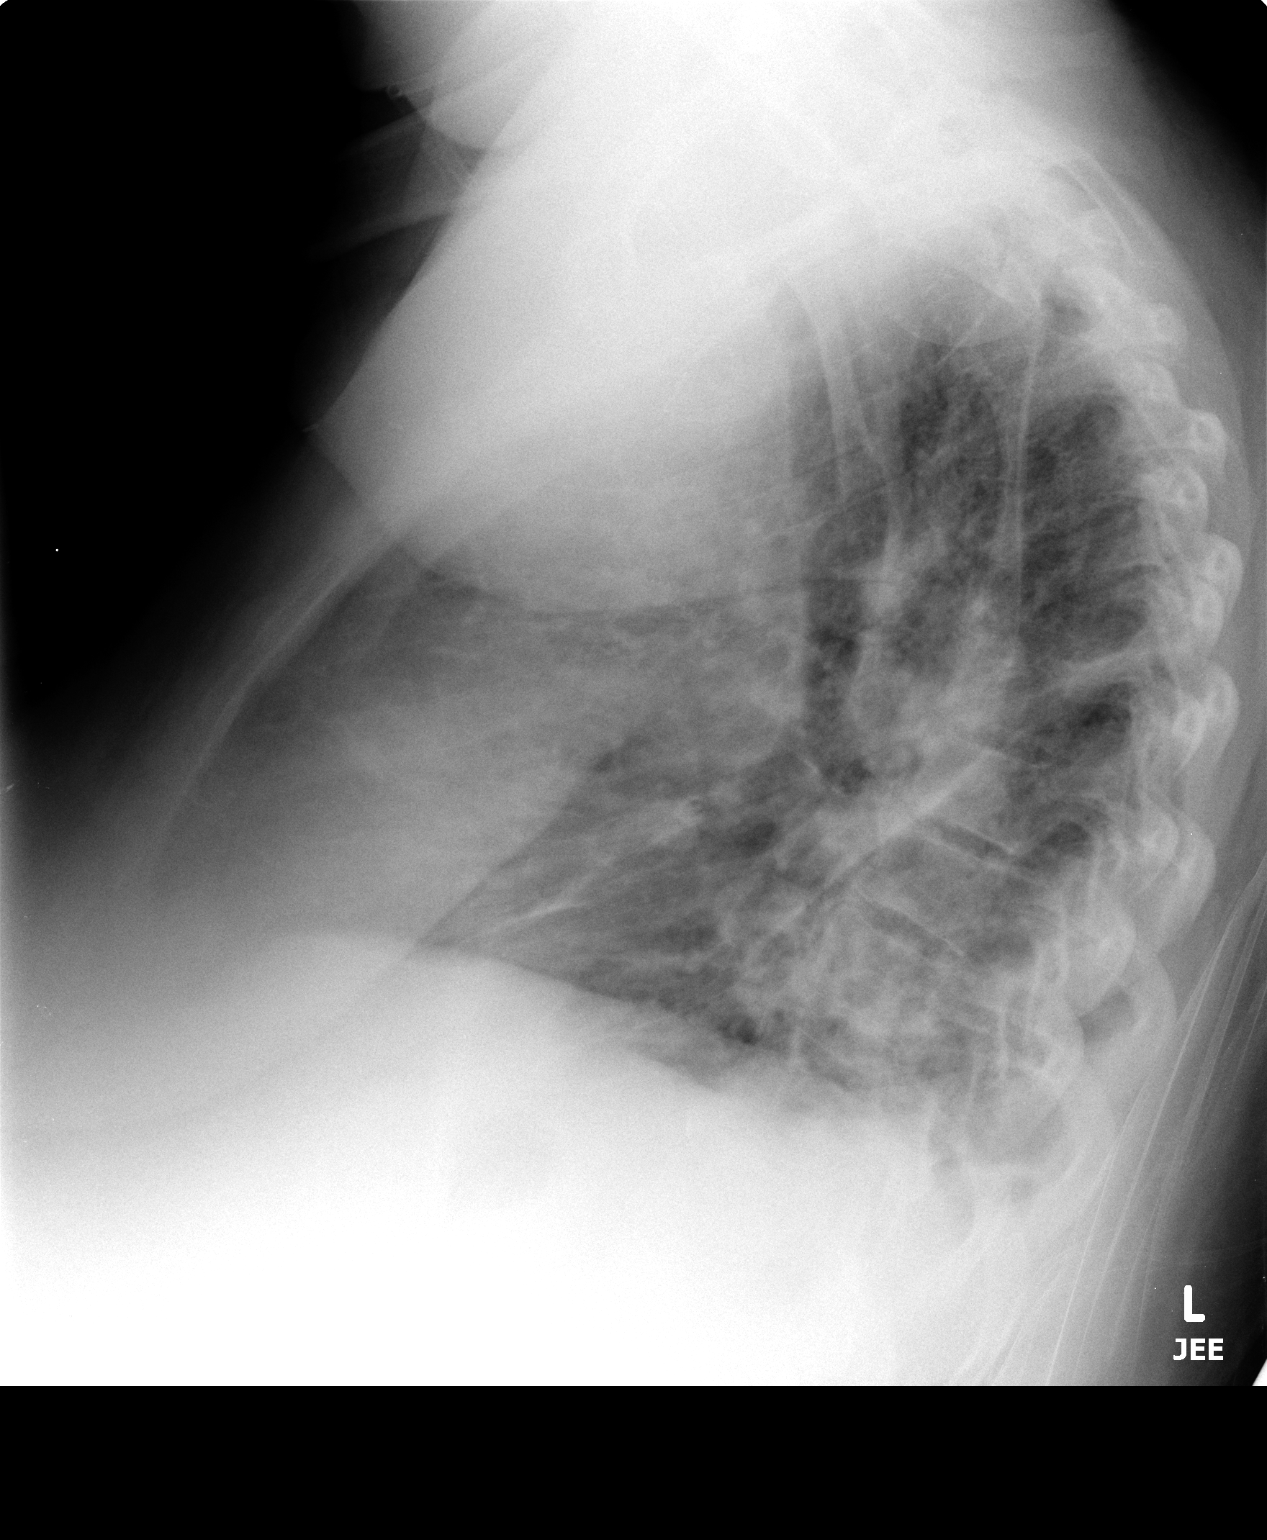

[2 of 2 positions shown; findings below may reference images not displayed]

FINDINGS: Two views of the chest show patchy opacity at the lung bases left greater than right.  Some of this opacity may represent atelectasis.  Pneumonia and small effusions are considerations.  Cardiomegaly is unchanged.
IMPRESSION: Patchy opacities at the lung bases.  Atelectasis or pneumonia and possible small effusions.

## 2008-05-02 IMAGING — XA IR CENTRAL VENOUS CATHETER
1 series · 15 of 15 positions shown · non-contrast
Comparison: none

CLINICAL DATA: Poor flows through dialysis catheter despite TPA dwell.

[Series 1000: run · 0.21mm/px · 15 of 15 slices shown]
[im 1/15]
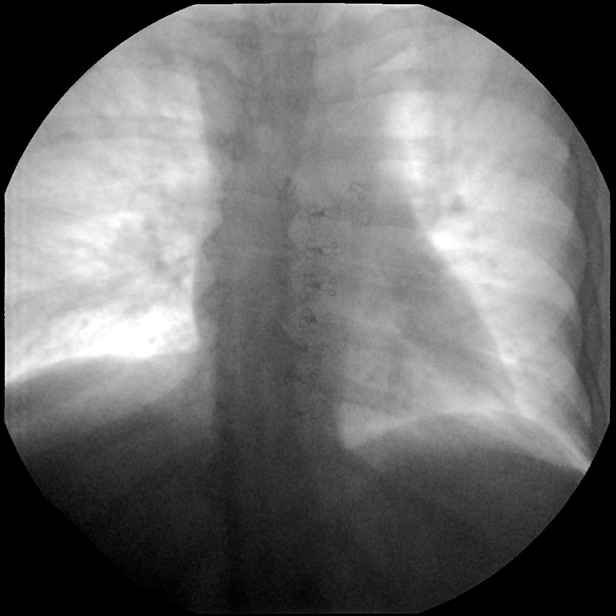
[im 2/15]
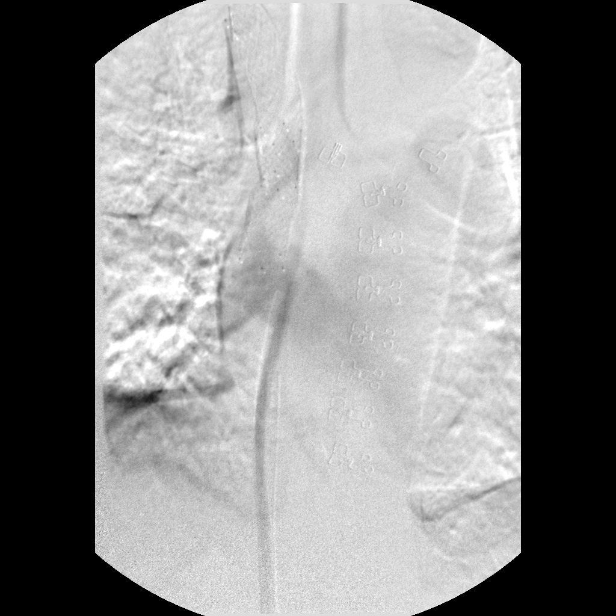
[im 3/15]
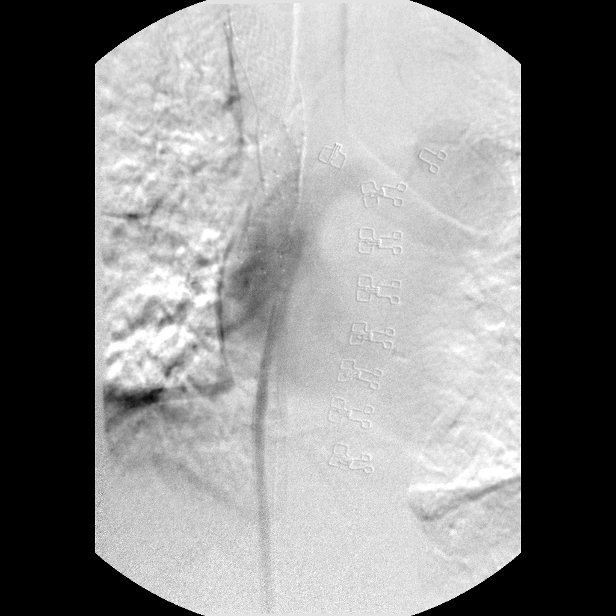
[im 4/15]
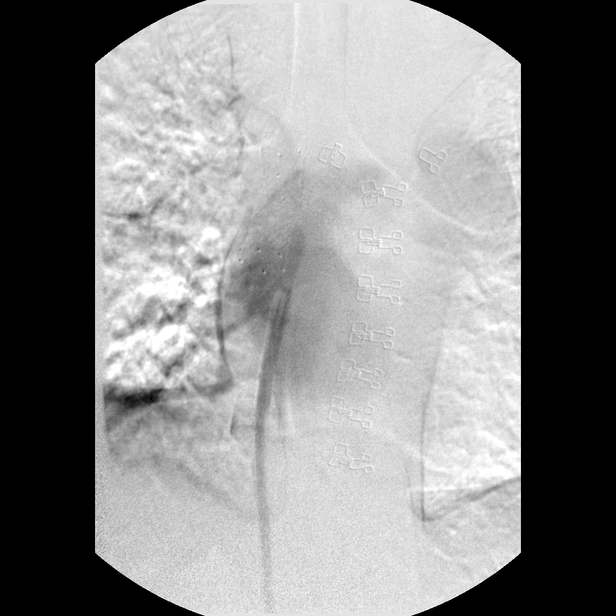
[im 5/15]
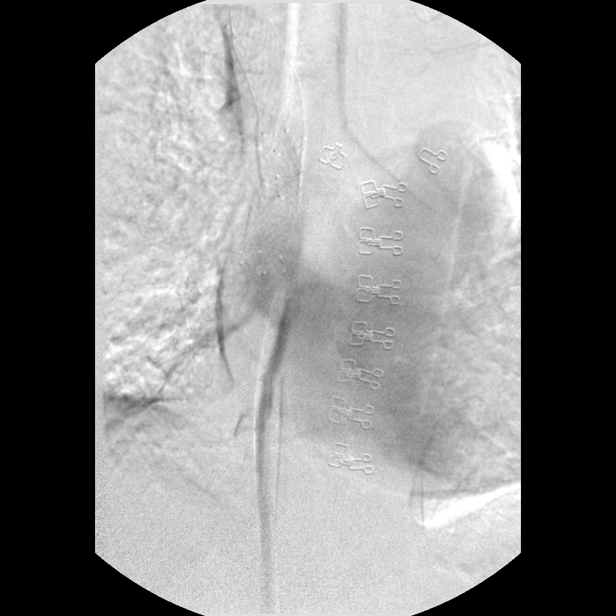
[im 6/15]
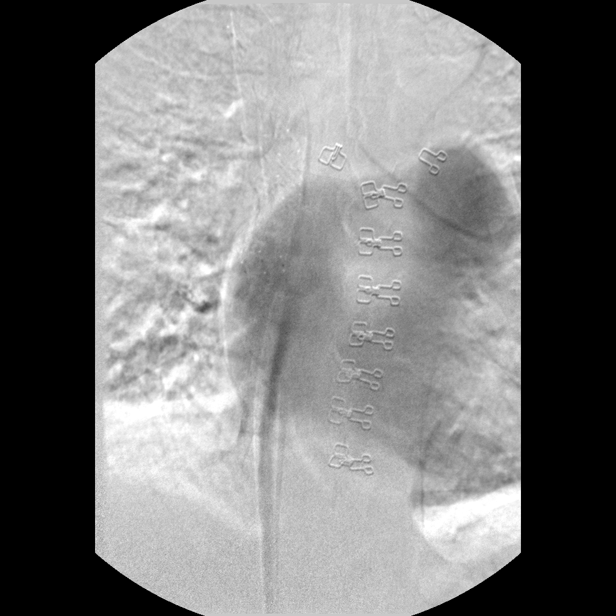
[im 7/15]
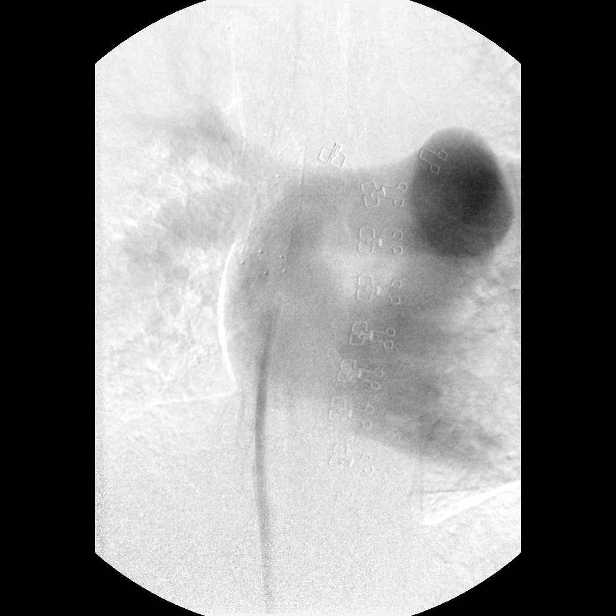
[im 8/15]
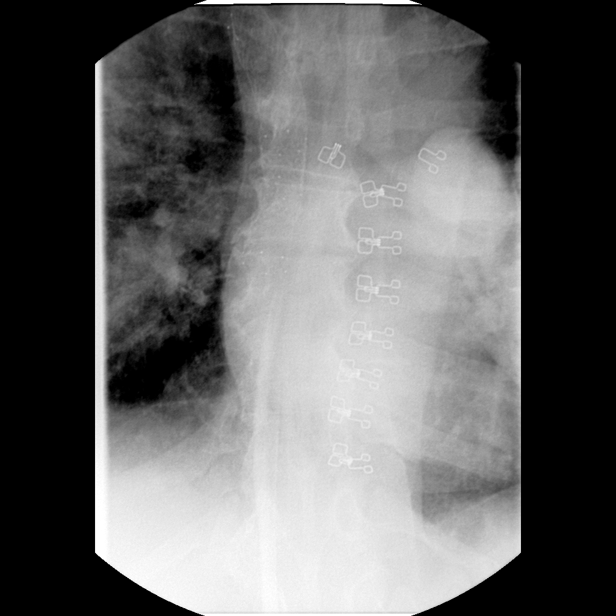
[im 9/15]
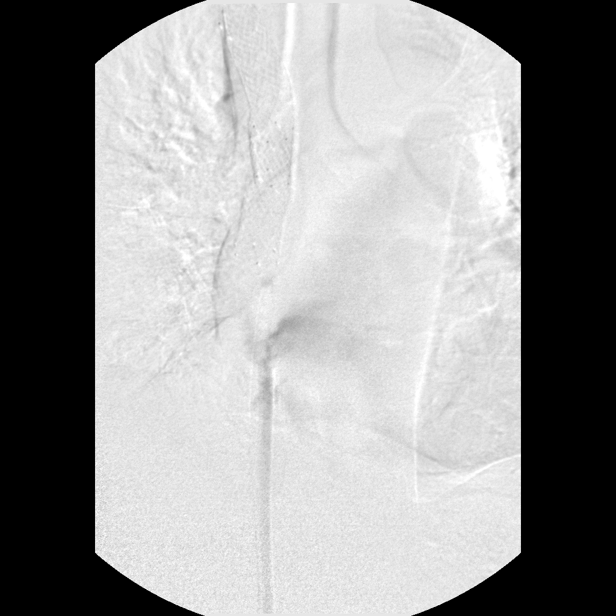
[im 10/15]
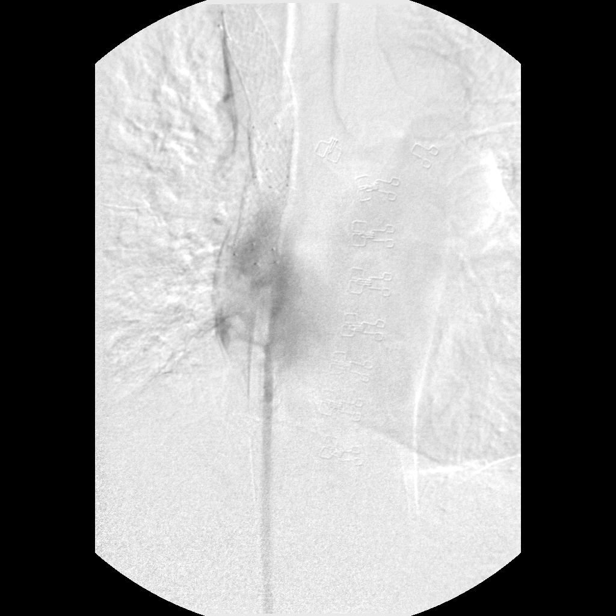
[im 11/15]
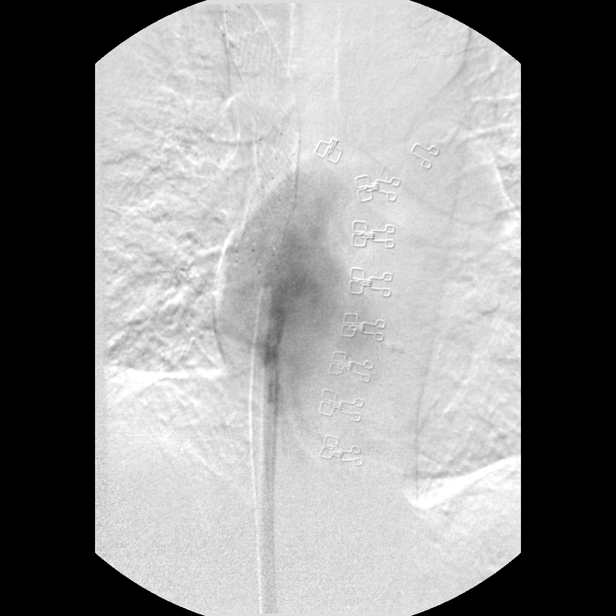
[im 12/15]
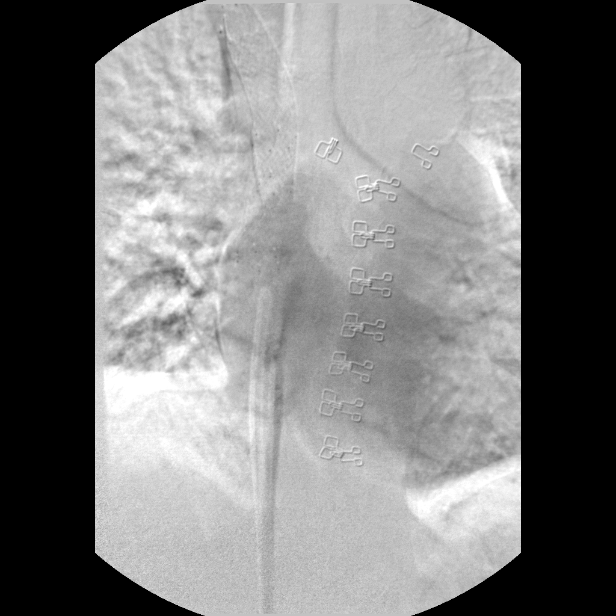
[im 13/15]
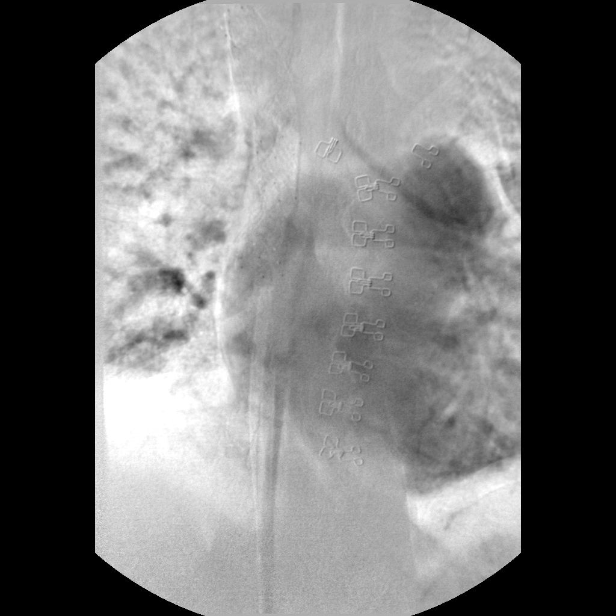
[im 14/15]
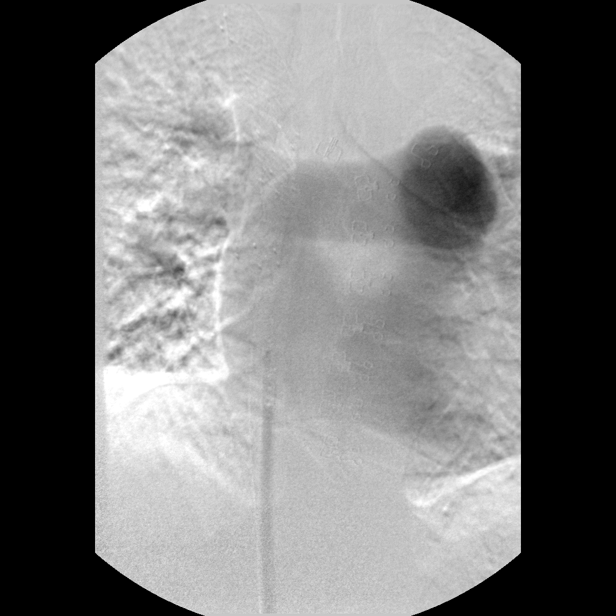
[im 15/15]
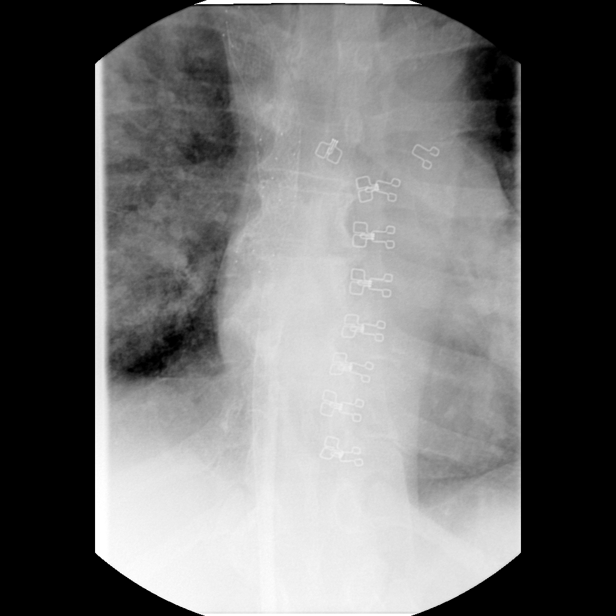

[15 of 15 positions shown; findings below may reference images not displayed]

Catheter injection under fluoroscopy:
Transcatheter thrombolytic infusion:

Fluoroscopic inspection shows proper position of the right femoral dialysis
catheter, tips in the low right atrium. Sequential injection through both limbs
of the catheter shows patency of the catheter with good flow to the right
atrium. No evidence malposition or thrombus. SVC stent is noted.

After screening and discussion with the patient, a protocol TPA infusion through
the catheter was initiated.
IMPRESSION: 1. Good position and patency of right femoral hemodialysis catheter. TPA
infusion was initiated.

## 2008-05-07 IMAGING — XA IR [PERSON_NAME]/EXT/UNI*R*
1 series · 10 of 10 positions shown · non-contrast
Comparison: none

CLINICAL DATA: infected right femoral hemodialysis catheter. Previous SVC stent
placement.

Superior venacavogram:
TECHNIQUE: Initially, survey ultrasound of the right internal, right external,
and left internal jugular veins was performed, demonstrating patency and
antegrade flow in all 3 vessels. Superior venacavogram performed via right arm
peripheral IV injection. This showed patency of the right subclavian vein and
SVC stent without any collateral filling.

[Series 1: run · 10 of 10 slices shown]
[im 1/10]
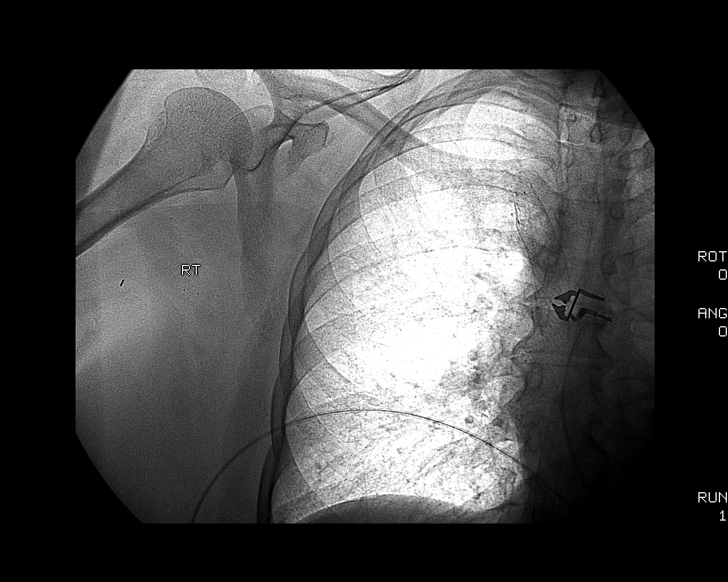
[im 2/10]
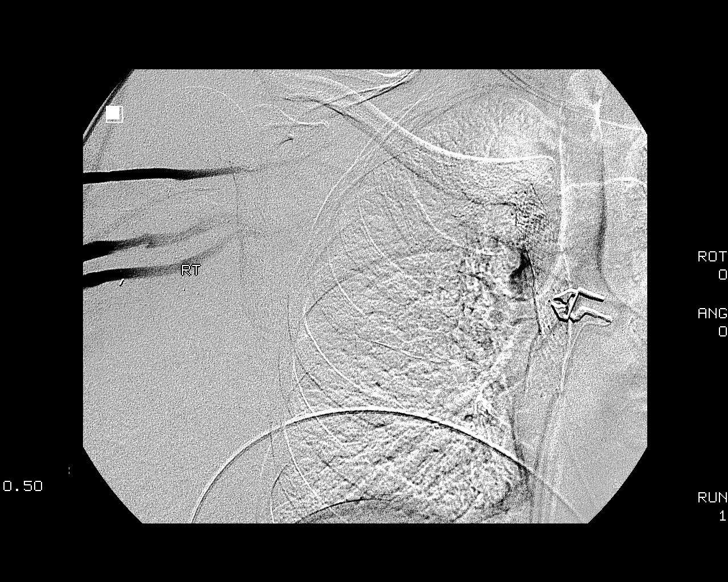
[im 3/10]
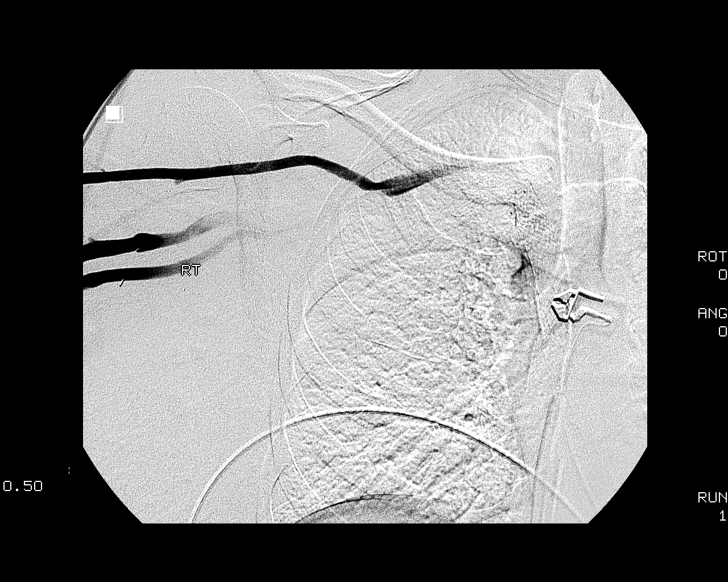
[im 4/10]
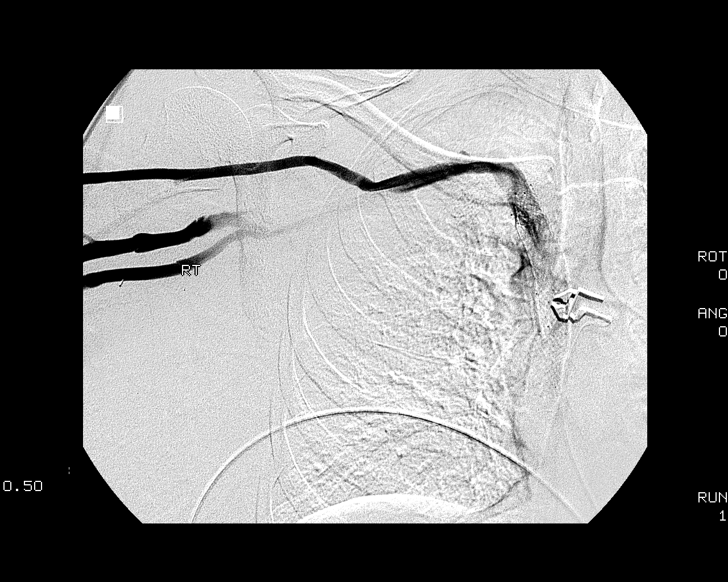
[im 5/10]
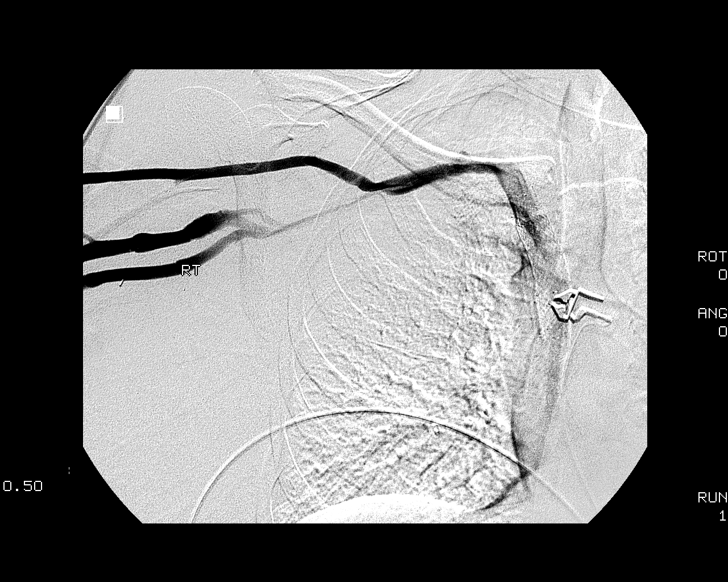
[im 6/10]
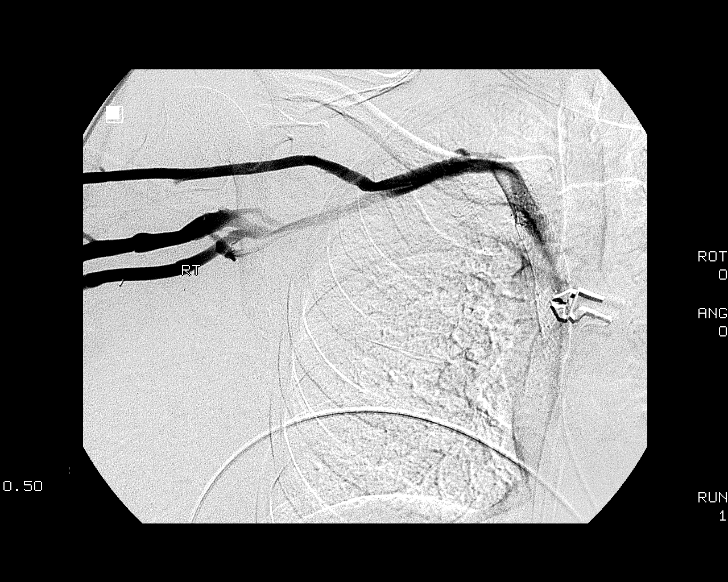
[im 7/10]
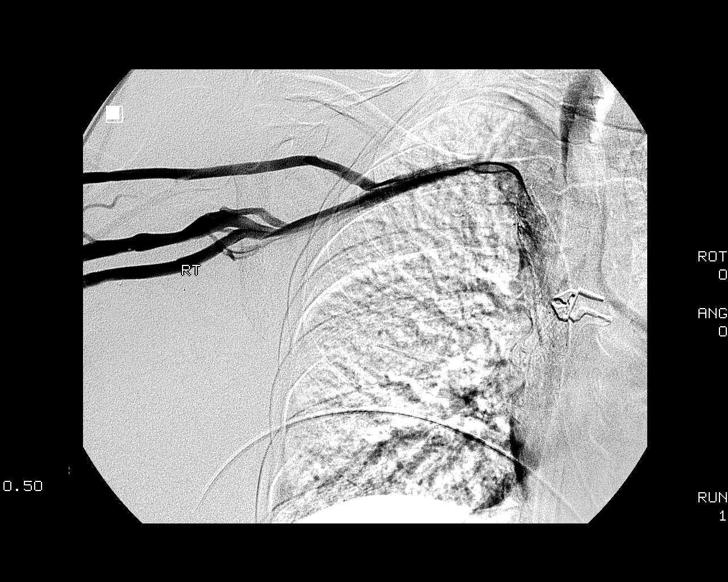
[im 8/10]
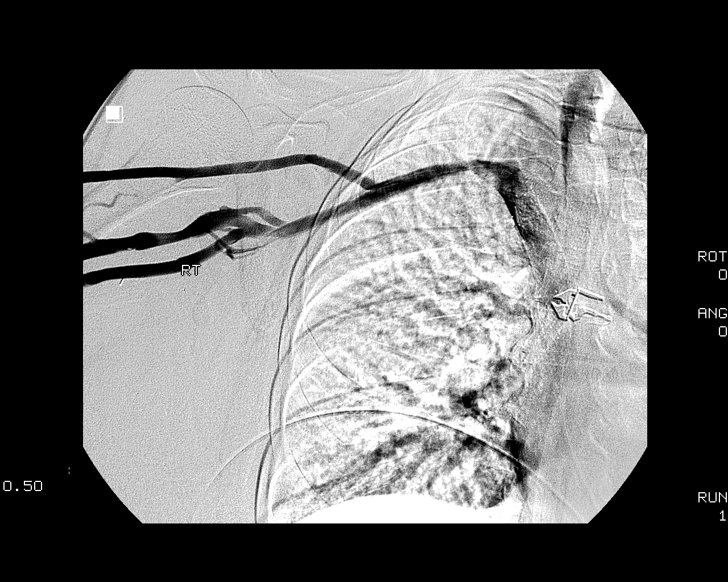
[im 9/10]
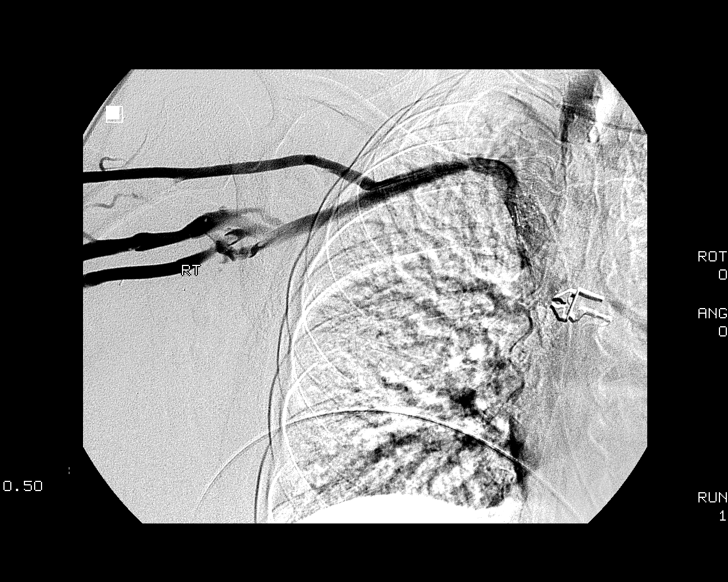
[im 10/10]
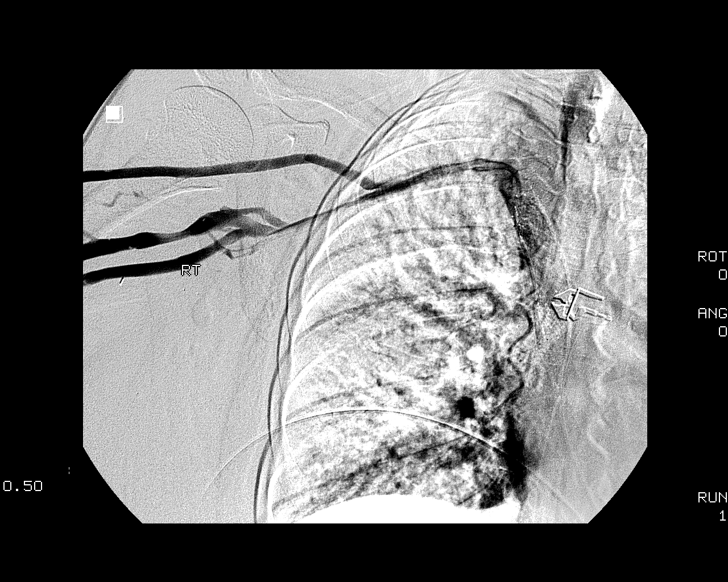

[10 of 10 positions shown; findings below may reference images not displayed]

IMPRESSION: 1. Patency of the right subclavian vein, SVC, and SVC stent. 
2. Antegrade flow in patent bilateral jugular venous systems on survey
ultrasound also noted.

Removal tunneled dialysis catheter:

The right femoral catheter and surrounding skin prepped with chlorhexidine,
draped in usual sterile fashion, infiltrated locally with 1% lidocaine. The
catheter was dissected free of the underlying soft tissues and removed intact.
Hemostasis achieved. No immediate complication.
IMPRESSION: 1. Successful removal of tunneled right femoral hemodialysis catheter.

## 2008-05-09 IMAGING — US IR FLUORO GUIDE CV LINE*R*
1 series · 2 of 2 positions shown · non-contrast
Comparison: none

CLINICAL DATA: 45 year old with end-stage renal disease and limited vascular access.  Recently removed femoral dialysis catheter.  Previous reconstruction of the superior vena cava with self-expanding stents.  The patient presents for new vascular access.
PLACEMENT OF TUNNELED DIALYSIS CATHETER ? 12/27/06: 
Sedation:  The patient was continuously monitored by the radiologic nurse who administered IV Versed 3 mg, and IV fentanyl 150 mcg for conscious sedation.  Total sedation time was 45 minutes.
Procedure:  Informed consent was obtained.  Review of a recent venogram demonstrated patency of the superior vena cava stents.  Ultrasound of the neck demonstrated a patent left internal jugular vein but this access was thought to be difficult due to the stents.  The right internal jugular vein was likely occluded but the right external jugular vein was patent.  The right neck and chest were prepped and draped in a sterile fashion.  The skin was anesthetized with Lidocaine.  A 21 gauge needle was directed into the patent right external jugular vein with ultrasound guidance.  A micropuncture wire was advanced through the stent into the right atrium.  A micropuncture dilator set was placed.  A series of venograms proved patency of the stent as well as the right atrium.  Contrast rapidly filled the pulmonary arterial structures.  The right chest was then anesthetized with lidocaine and a small incision was made with a #11 blade scalpel.  A subcutaneous tunnel was formed to the vein dermatotome site.  A 23 cm tip-to-cuff Hemosplit catheter was brought through the subcutaneous tunnel to the vein dermatotome site.  The micropuncture dilator set was exchanged for a valved Peel-Away sheath.  The catheter was then placed through the Peel-Away sheath and placed within the right atrium.  Both lumens were found to aspirate and flush very well.  Due to the patient?s heparin allergy, the catheter was flushed with sodium citrate.  The catheter was secured to the skin using suture.  The vein dermatotome site was closed using a subcutaneous absorbable suture and Dermabond.

[Series 1: ir fluoro guide cv line*right* · 2 of 2 slices shown]
[im 1/2]
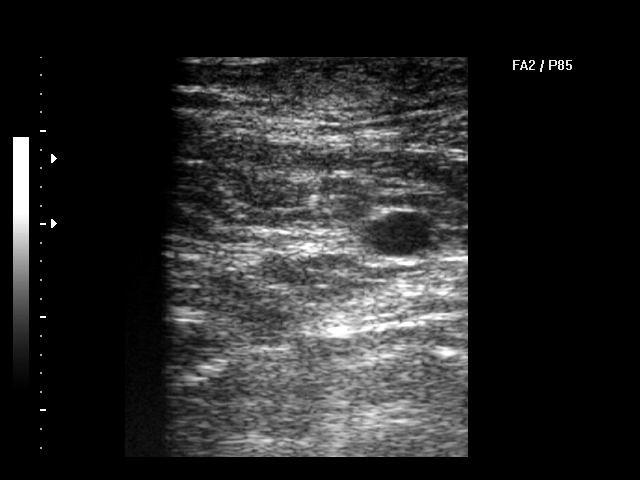
[im 2/2]
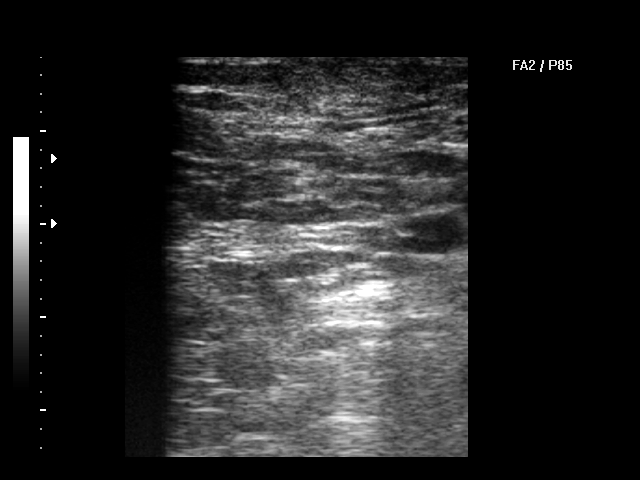

[2 of 2 positions shown; findings below may reference images not displayed]

IMPRESSION: Successful placement of a tunneled dialysis catheter through the right external jugular vein and through the SVC stent.  The tips of the catheter were in the right atrium and ready to be used.

## 2008-12-27 ENCOUNTER — Ambulatory Visit (HOSPITAL_COMMUNITY): Admission: RE | Admit: 2008-12-27 | Discharge: 2008-12-27 | Payer: Self-pay | Admitting: Nephrology

## 2009-01-03 ENCOUNTER — Ambulatory Visit: Payer: Self-pay | Admitting: Surgery

## 2009-01-05 IMAGING — XA IR FLUORO GUIDE CV LINE*R*
1 series · 7 of 7 positions shown · non-contrast
Comparison: none

CLINICAL DATA: 48 hours ago, a right IJ vein tunneled dialysis catheter was removed.  It was noted to be grossly infected.  The insertion site was below the right clavicle.  Examination today demonstrates swelling and mild erythema below the clavicle but no pus or fluctuance.  Above the clavicle, the skin is normal without erythema or swelling.  The catheter was then determined to be placed in the right supraclavicular region tunneling laterally and posteriorly away from the previously noted site of infection.  She is currently on vancomycin.
 1.  ULTRASOUND GUIDANCE FOR VASCULAR ACCESS:
 2.  FLUOROSCOPIC GUIDANCE FOR RIGHT IJ VEIN TUNNELED DIALYSIS CATHETER PLACEMENT 
 Procedure:  The right neck was prepped and draped in a sterile fashion.  Lidocaine was utilized for local anesthesia.   Under sonographic guidance, a micropuncture needle was inserted into a collateral venous structure in the right supraclavicular region.  It was removed over an 018 wire which was coiled in the collateral.  The 5 French transitional dilator was inserted.  A stiff glidewire was then advanced through the transitional dilator across a suspected occlusion into the right IJ vein and into the IVC.  A 5 French dilator was inserted.  It was removed over an Amplatz wire.  A second incision was made in the supraclavicular region posteriorly and laterally from the venipuncture site.  The tunneled dialysis catheter, 23 cm tip-to-cuff, was then pulled from the lateral incision out the venipuncture site incision. The Peel-Away sheath was inserted.  The tunneled catheter was then advanced through the Peel-Away sheath.  The Peel-Away sheath was removed. The catheter was flushed. The venipuncture incision was closed with a 4-0 Vicryl subcuticular stitch.  The lateral incision was closed with an 0 Prolene purse-string knot.   Dermabond was applied.  No complications.

[Series 1: run · 7 of 7 slices shown]
[im 1/7]
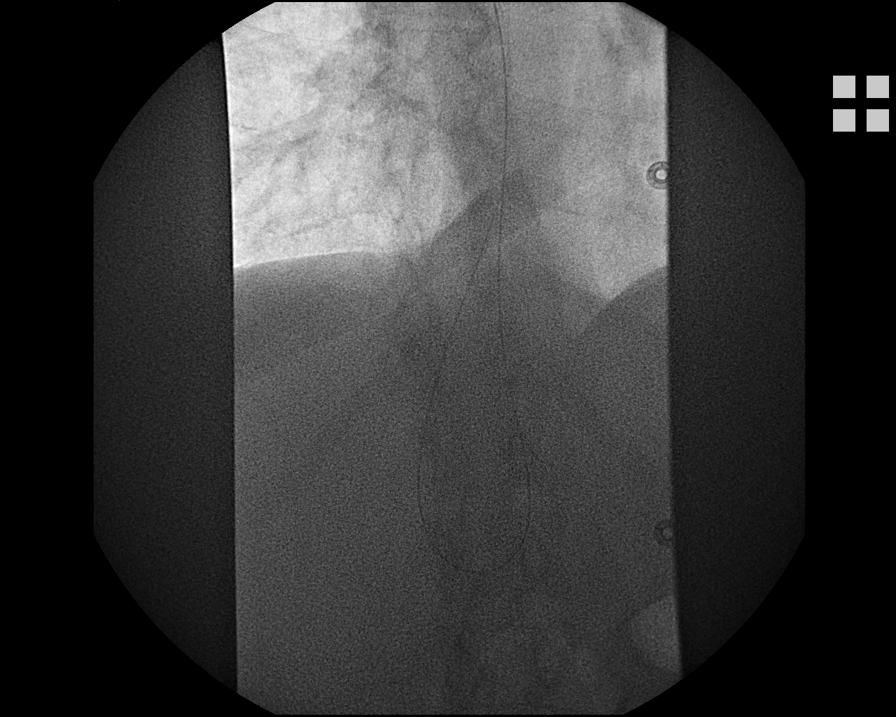
[im 2/7]
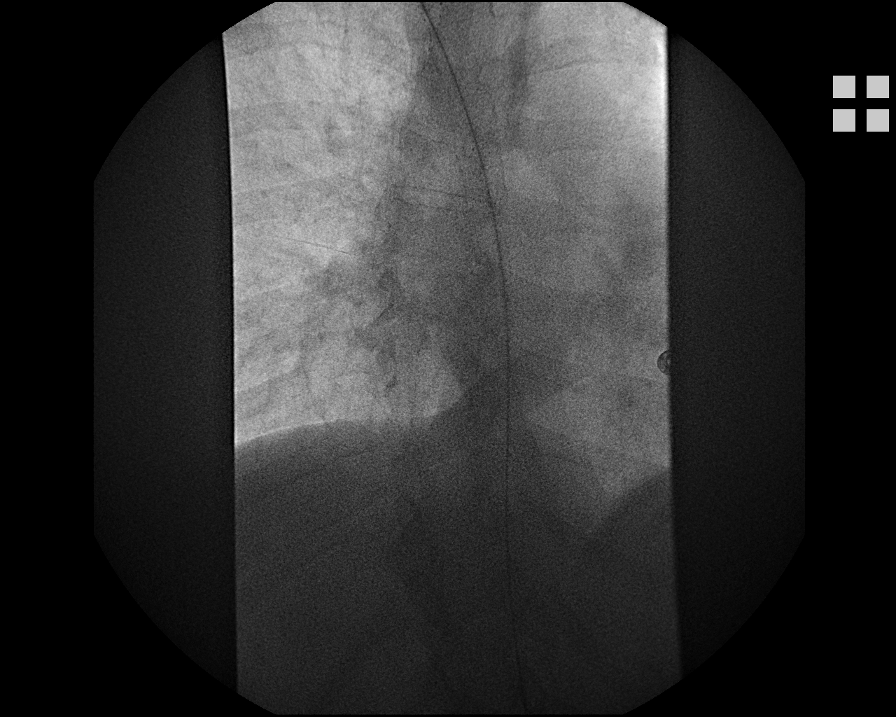
[im 3/7]
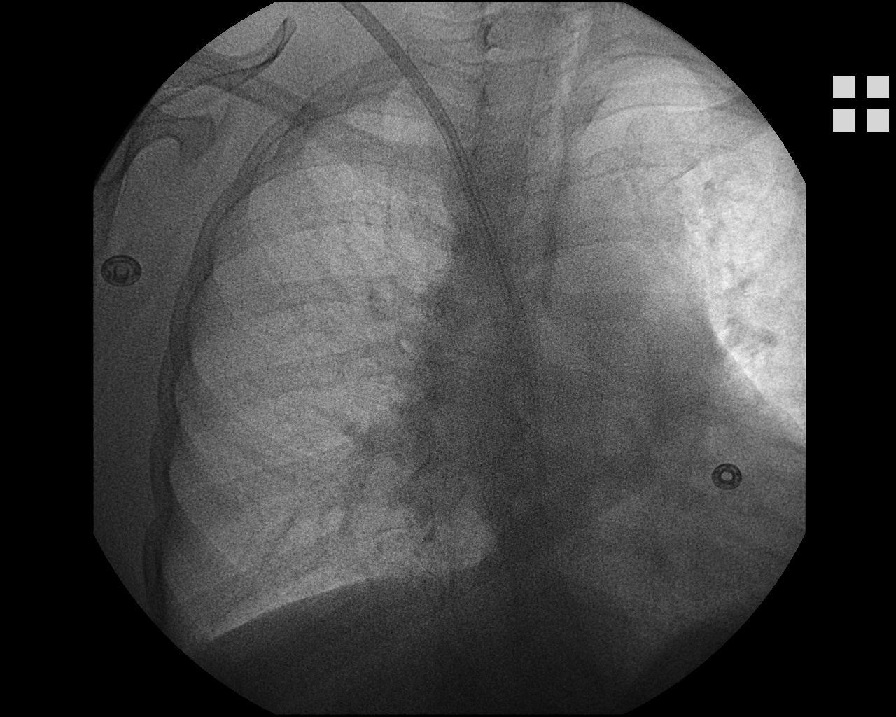
[im 4/7]
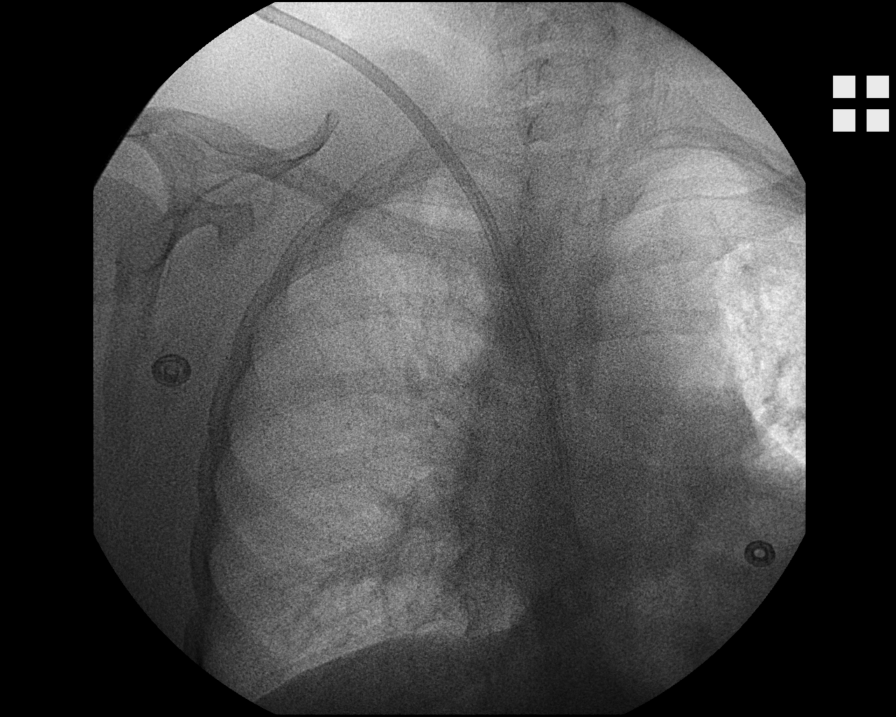
[im 5/7]
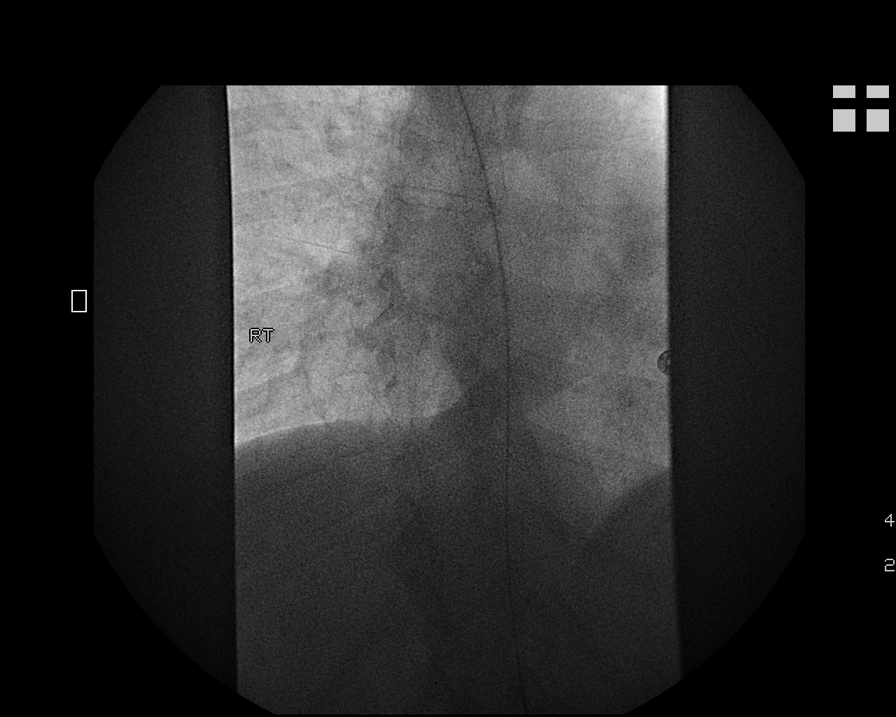
[im 6/7]
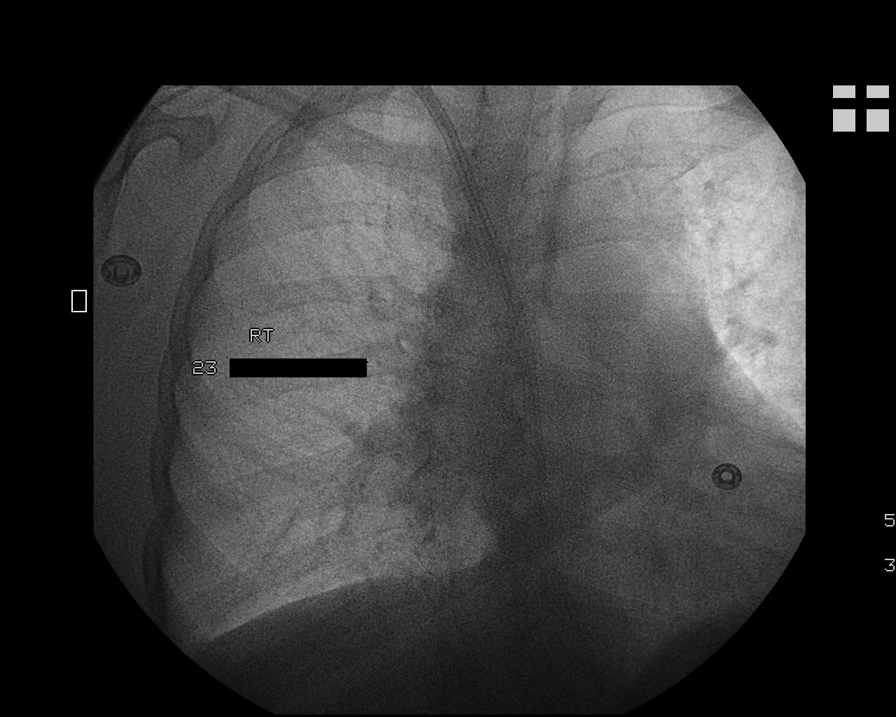
[im 7/7]
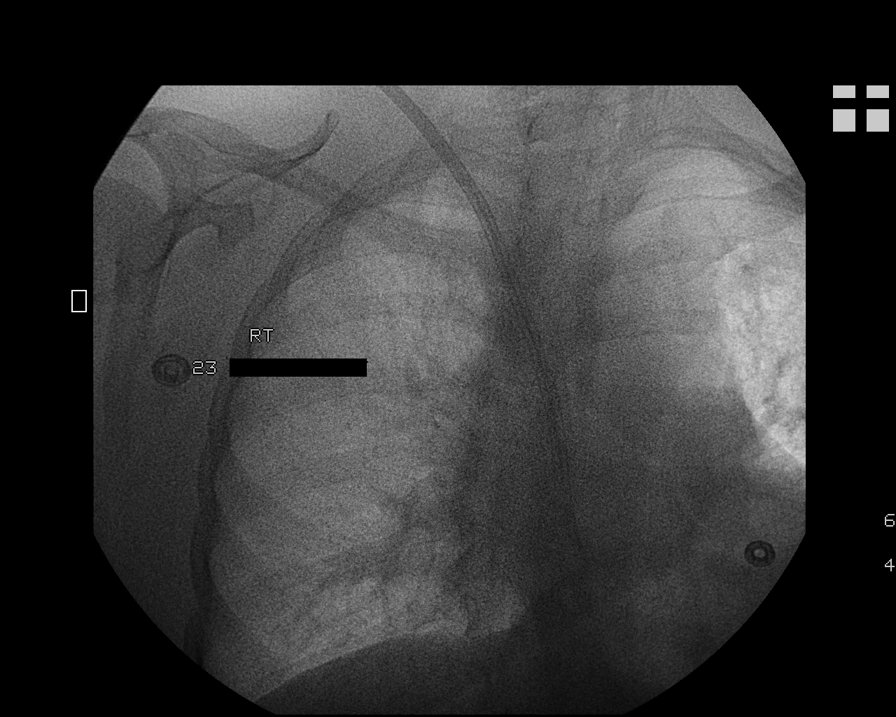

[7 of 7 positions shown; findings below may reference images not displayed]

FINDINGS: Images demonstrate placement of a tunneled dialysis catheter with the tips in the right atrium.
IMPRESSION: Successful right IJ vein tunneled dialysis catheter.

## 2009-01-06 ENCOUNTER — Telehealth: Payer: Self-pay | Admitting: Internal Medicine

## 2009-01-07 ENCOUNTER — Ambulatory Visit: Payer: Self-pay | Admitting: Surgery

## 2009-01-09 ENCOUNTER — Inpatient Hospital Stay (HOSPITAL_COMMUNITY): Admission: AD | Admit: 2009-01-09 | Discharge: 2009-01-14 | Payer: Self-pay | Admitting: Surgery

## 2009-02-02 ENCOUNTER — Ambulatory Visit: Payer: Self-pay | Admitting: Vascular Surgery

## 2009-02-14 ENCOUNTER — Ambulatory Visit: Payer: Self-pay | Admitting: Surgery

## 2009-03-08 ENCOUNTER — Ambulatory Visit (HOSPITAL_COMMUNITY): Admission: RE | Admit: 2009-03-08 | Discharge: 2009-03-08 | Payer: Self-pay | Admitting: Nephrology

## 2009-03-23 ENCOUNTER — Ambulatory Visit: Payer: Self-pay | Admitting: Vascular Surgery

## 2009-03-23 ENCOUNTER — Ambulatory Visit (HOSPITAL_COMMUNITY): Admission: RE | Admit: 2009-03-23 | Discharge: 2009-03-23 | Payer: Self-pay | Admitting: Surgery

## 2009-03-25 ENCOUNTER — Inpatient Hospital Stay (HOSPITAL_COMMUNITY): Admission: EM | Admit: 2009-03-25 | Discharge: 2009-03-31 | Payer: Self-pay | Admitting: Emergency Medicine

## 2009-03-25 ENCOUNTER — Ambulatory Visit: Payer: Self-pay | Admitting: Pulmonary Disease

## 2009-03-28 ENCOUNTER — Encounter: Payer: Self-pay | Admitting: Pulmonary Disease

## 2009-04-15 ENCOUNTER — Inpatient Hospital Stay (HOSPITAL_COMMUNITY): Admission: EM | Admit: 2009-04-15 | Discharge: 2009-04-28 | Payer: Self-pay | Admitting: Emergency Medicine

## 2009-04-16 ENCOUNTER — Encounter (INDEPENDENT_AMBULATORY_CARE_PROVIDER_SITE_OTHER): Payer: Self-pay | Admitting: *Deleted

## 2009-04-16 ENCOUNTER — Ambulatory Visit: Payer: Self-pay | Admitting: Surgery

## 2009-04-18 ENCOUNTER — Encounter (INDEPENDENT_AMBULATORY_CARE_PROVIDER_SITE_OTHER): Payer: Self-pay | Admitting: *Deleted

## 2009-04-20 ENCOUNTER — Ambulatory Visit: Payer: Self-pay | Admitting: Surgery

## 2009-04-25 ENCOUNTER — Encounter: Payer: Self-pay | Admitting: Cardiovascular Disease

## 2009-04-26 ENCOUNTER — Ambulatory Visit: Payer: Self-pay | Admitting: Infectious Diseases

## 2009-06-08 ENCOUNTER — Ambulatory Visit: Payer: Self-pay | Admitting: *Deleted

## 2009-06-08 ENCOUNTER — Ambulatory Visit (HOSPITAL_COMMUNITY): Admission: RE | Admit: 2009-06-08 | Discharge: 2009-06-08 | Payer: Self-pay | Admitting: Vascular Surgery

## 2009-06-28 ENCOUNTER — Ambulatory Visit (HOSPITAL_COMMUNITY): Admission: RE | Admit: 2009-06-28 | Discharge: 2009-06-28 | Payer: Self-pay | Admitting: Nephrology

## 2009-06-30 ENCOUNTER — Ambulatory Visit (HOSPITAL_COMMUNITY): Admission: RE | Admit: 2009-06-30 | Discharge: 2009-06-30 | Payer: Self-pay | Admitting: Nephrology

## 2009-07-22 IMAGING — XA IR FLUORO GUIDE CV LINE*R*
1 series · 3 of 3 positions shown · non-contrast
Comparison: none

TUNNEL CATHETER rePLACEMENT WITH ULTRASOUND
CLINICAL DATA: Poor function of existing dialysis catheter

Date of Procedure: 03/10/2008
Reason for exam/procedure: Hemodialysis
Medications: 1% lidocaine into the skin and subcutaneous tissue.
Fluoroscopy time: 0.4 minutes
TECHNIQUE: After written informed consent was obtained, patient was
placed in the supine position on angiographic table.  The right
Upper hemithorax was prepped and draped in a sterile fashion.
Utilizing blunt dissection, the cuff of the existing catheter was
freed from the underlying subcutaneous tissue.  It was then
exchanged over an Amplatz wire for a new 23 cm tip to cough
tunneled dialysis catheter.  The cuff was positioned in the
subcutaneous tissue.  Was flushed and sewn to the skin with a
pursestring not.  No complications.
Findings the tip of the new dialysis catheter is at the cavoatrial
junction.

[Series 1: run · 3 of 3 slices shown]
[im 1/3]
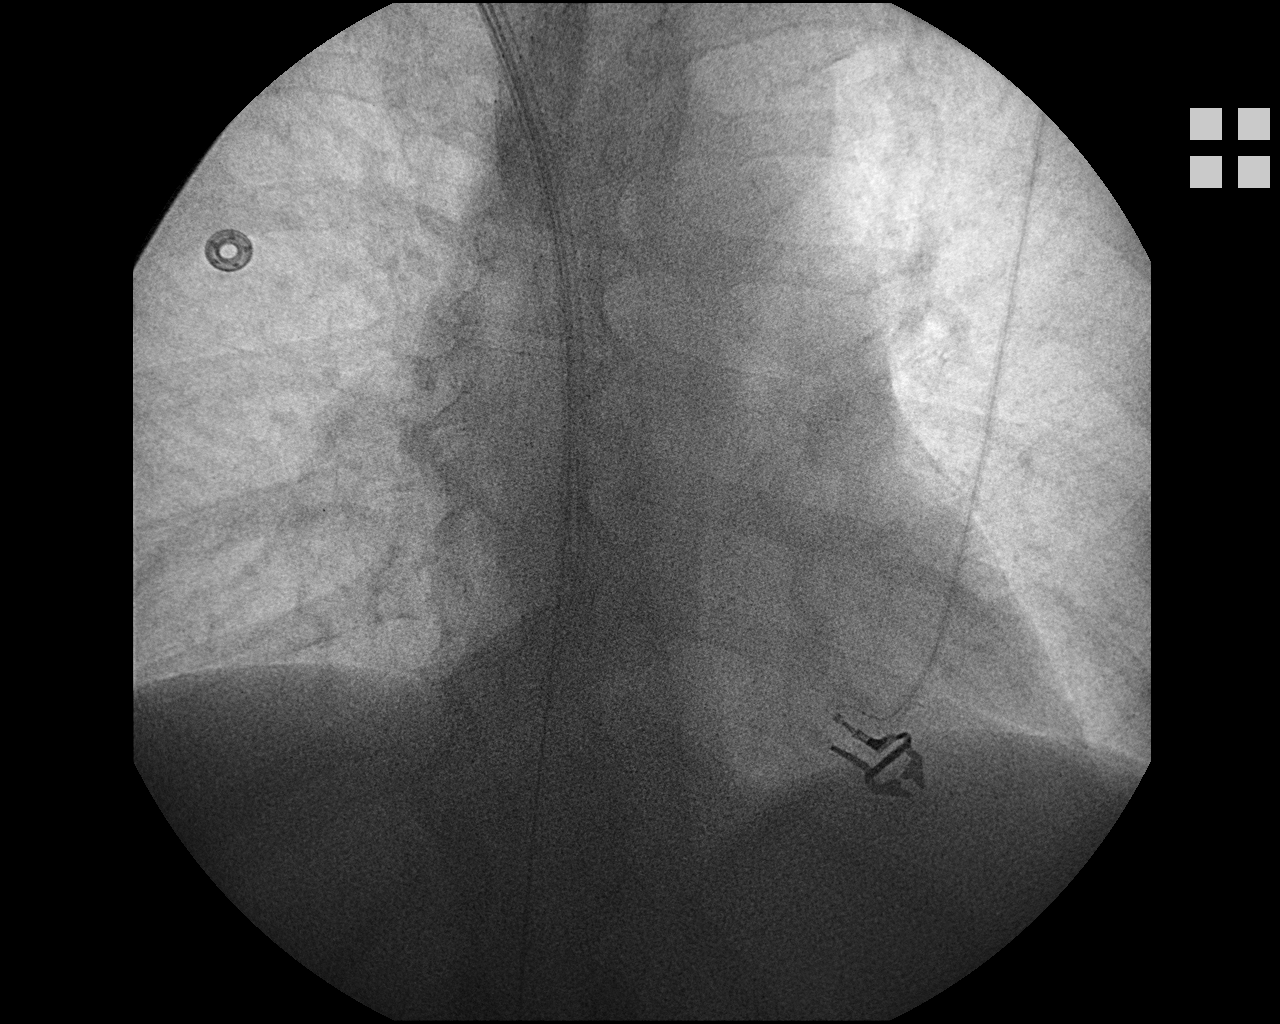
[im 2/3]
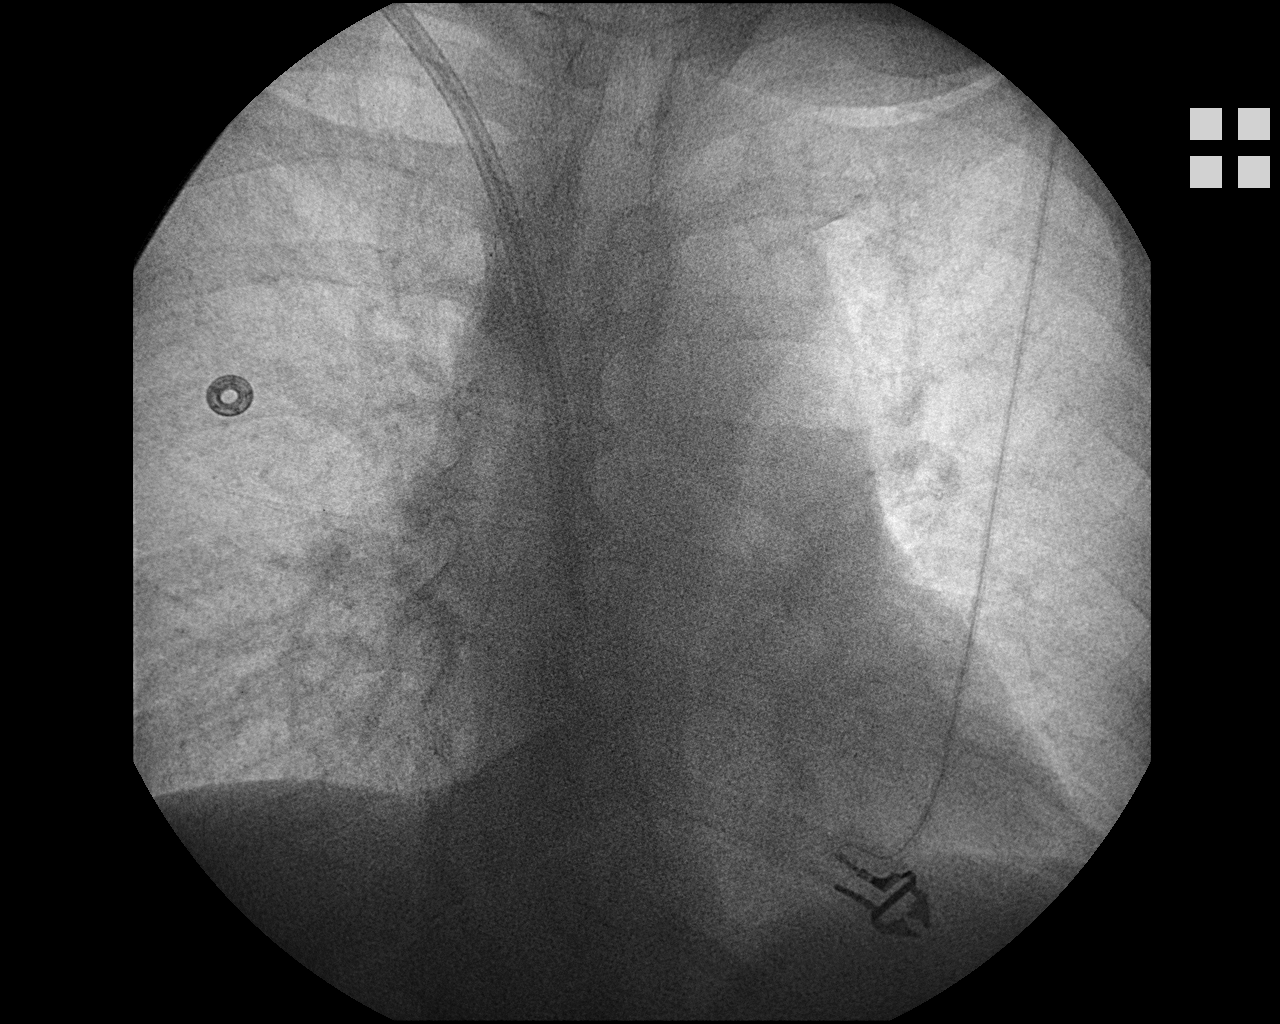
[im 3/3]
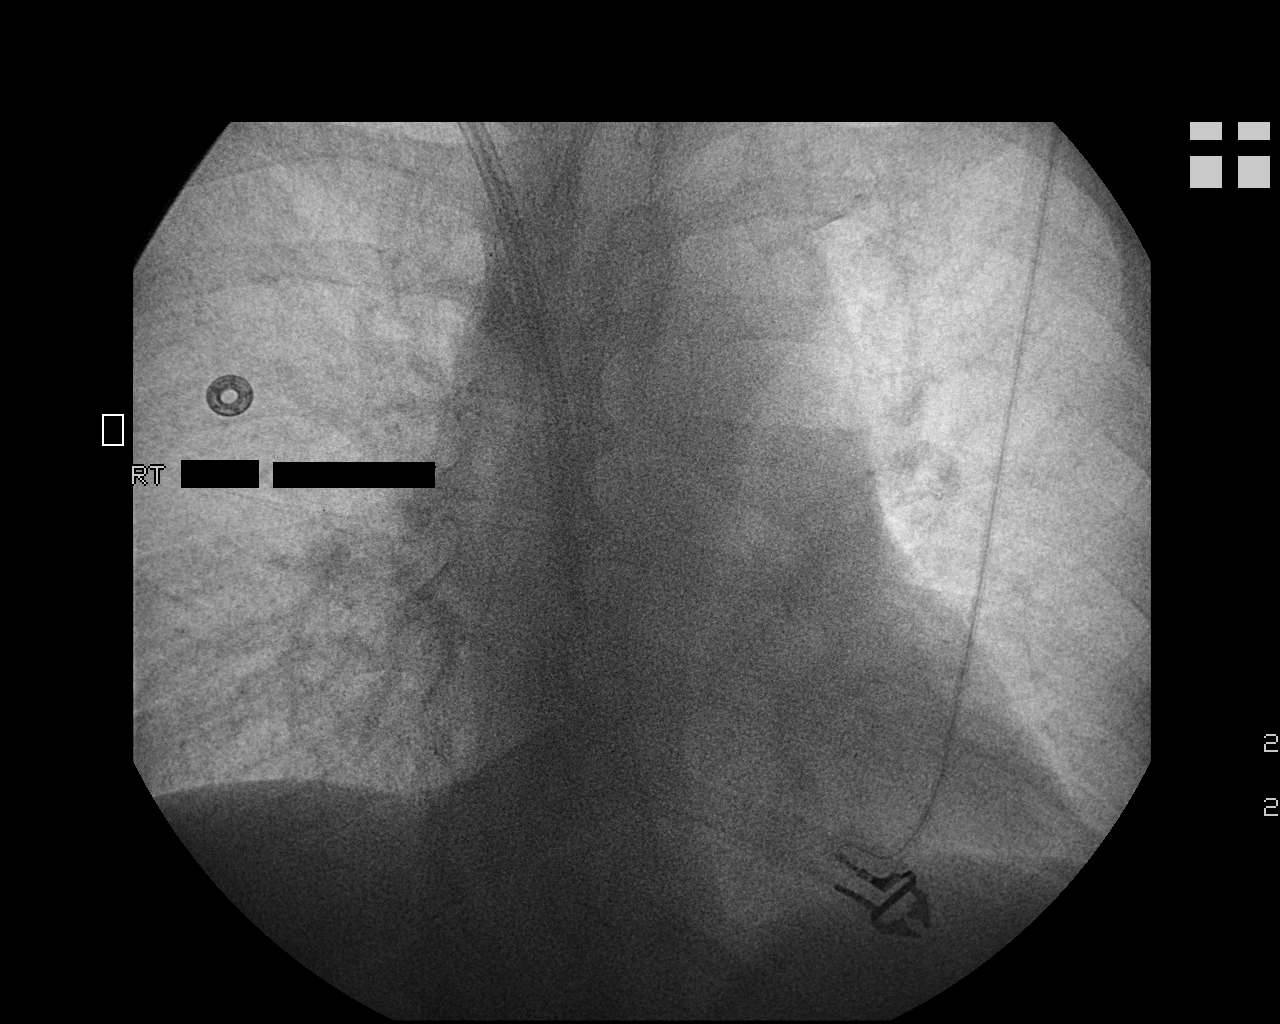

[3 of 3 positions shown; findings below may reference images not displayed]

IMPRESSION: Successful tunneled dialysis catheter exchange.

## 2009-07-27 ENCOUNTER — Ambulatory Visit (HOSPITAL_COMMUNITY): Admission: RE | Admit: 2009-07-27 | Discharge: 2009-07-27 | Payer: Self-pay | Admitting: Nephrology

## 2009-08-31 ENCOUNTER — Ambulatory Visit (HOSPITAL_COMMUNITY): Admission: RE | Admit: 2009-08-31 | Discharge: 2009-08-31 | Payer: Self-pay | Admitting: Surgery

## 2009-08-31 ENCOUNTER — Ambulatory Visit: Payer: Self-pay | Admitting: Surgery

## 2009-10-31 ENCOUNTER — Emergency Department (HOSPITAL_COMMUNITY): Admission: EM | Admit: 2009-10-31 | Discharge: 2009-10-31 | Payer: Self-pay | Admitting: Emergency Medicine

## 2009-12-08 ENCOUNTER — Ambulatory Visit (HOSPITAL_COMMUNITY): Admission: RE | Admit: 2009-12-08 | Discharge: 2009-12-08 | Payer: Self-pay | Admitting: Nephrology

## 2009-12-08 ENCOUNTER — Emergency Department (HOSPITAL_COMMUNITY): Admission: EM | Admit: 2009-12-08 | Discharge: 2009-12-09 | Payer: Self-pay | Admitting: Emergency Medicine

## 2009-12-09 DIAGNOSIS — Z79899 Other long term (current) drug therapy: Secondary | ICD-10-CM | POA: Insufficient documentation

## 2009-12-09 DIAGNOSIS — Z7721 Contact with and (suspected) exposure to potentially hazardous body fluids: Secondary | ICD-10-CM | POA: Insufficient documentation

## 2009-12-09 DIAGNOSIS — Z86718 Personal history of other venous thrombosis and embolism: Secondary | ICD-10-CM | POA: Insufficient documentation

## 2009-12-09 DIAGNOSIS — Z8614 Personal history of Methicillin resistant Staphylococcus aureus infection: Secondary | ICD-10-CM | POA: Insufficient documentation

## 2009-12-09 DIAGNOSIS — Z86011 Personal history of benign neoplasm of the brain: Secondary | ICD-10-CM | POA: Insufficient documentation

## 2009-12-09 DIAGNOSIS — Z91041 Radiographic dye allergy status: Secondary | ICD-10-CM | POA: Insufficient documentation

## 2009-12-21 ENCOUNTER — Ambulatory Visit (HOSPITAL_COMMUNITY): Admission: RE | Admit: 2009-12-21 | Discharge: 2009-12-21 | Payer: Self-pay | Admitting: Nephrology

## 2010-04-03 ENCOUNTER — Emergency Department (HOSPITAL_COMMUNITY): Admission: EM | Admit: 2010-04-03 | Discharge: 2010-04-03 | Payer: Self-pay | Admitting: Emergency Medicine

## 2010-05-10 IMAGING — XA DG MYELOGRAM CERVICAL
1 series · 12 of 24 positions shown · non-contrast
Comparison: none

CLINICAL HISTORY: 39-year with end-stage renal disease and evaluate
for future venous access.

[Series 1: run · 12 of 50 slices shown]
[im 3/50]
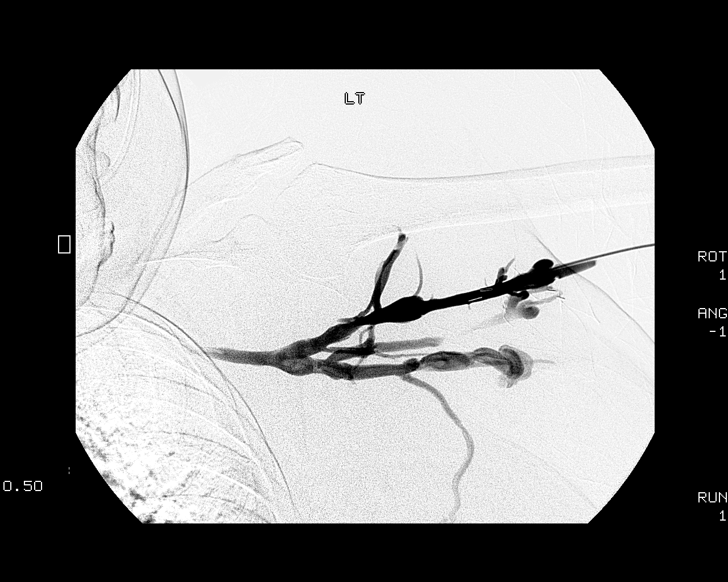
[im 7/50]
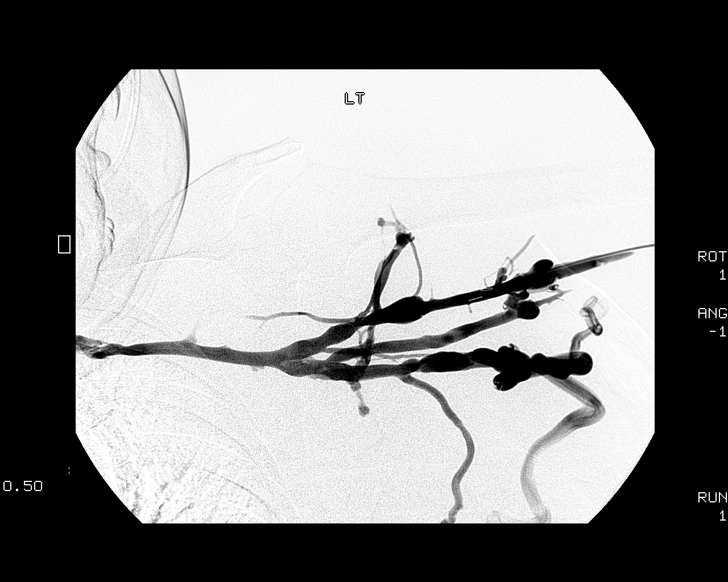
[im 11/50]
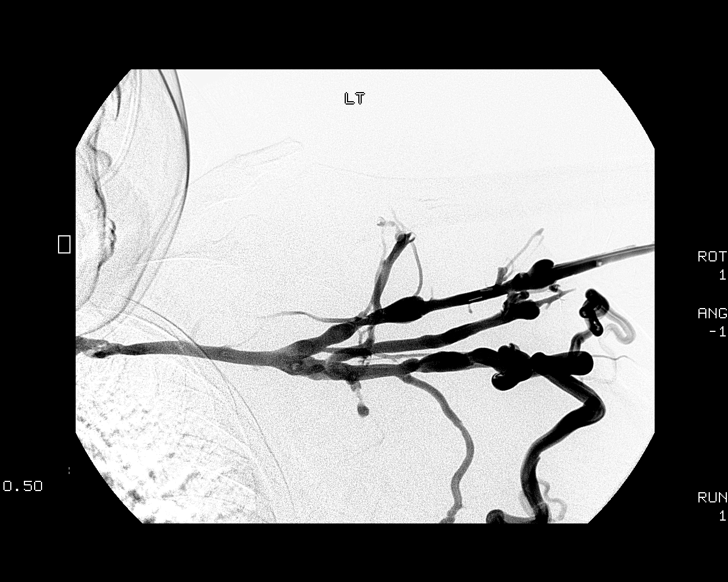
[im 15/50]
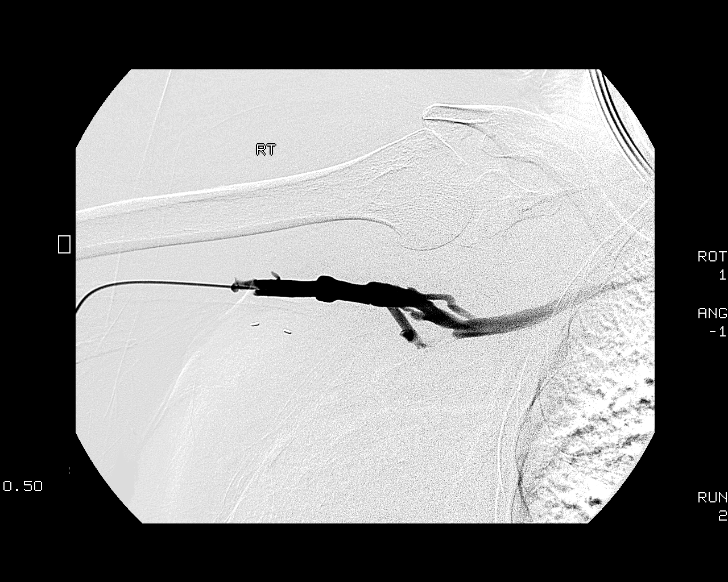
[im 20/50]
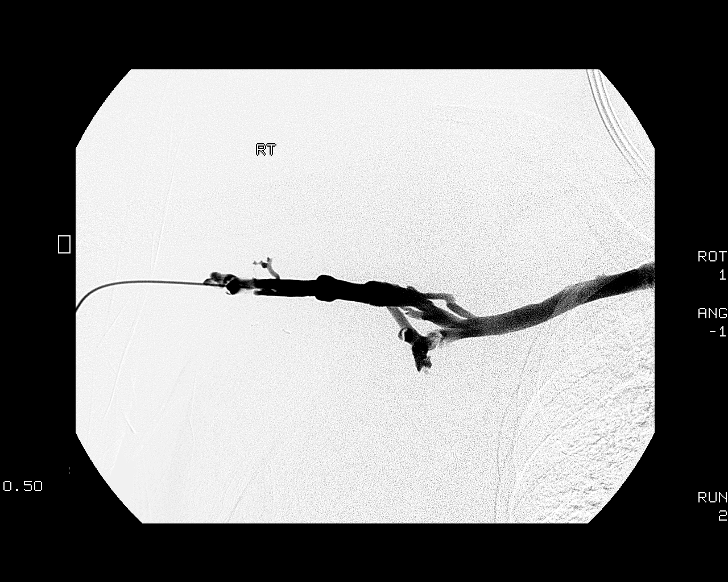
[im 24/50]
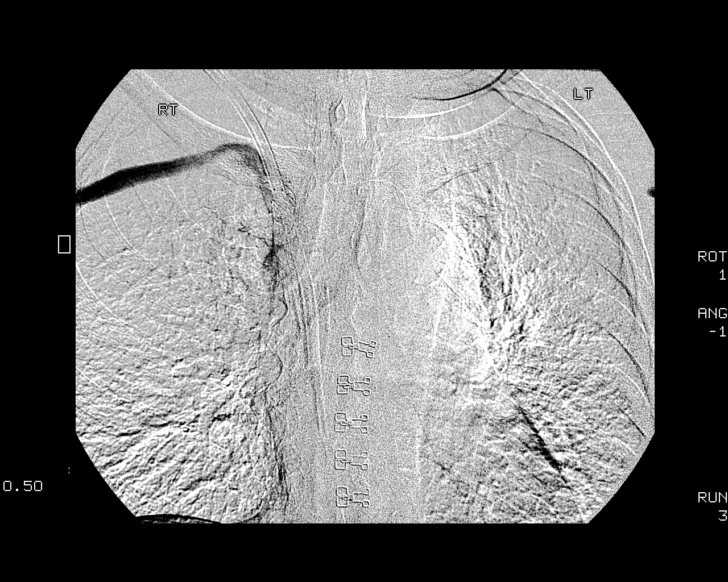
[im 28/50]
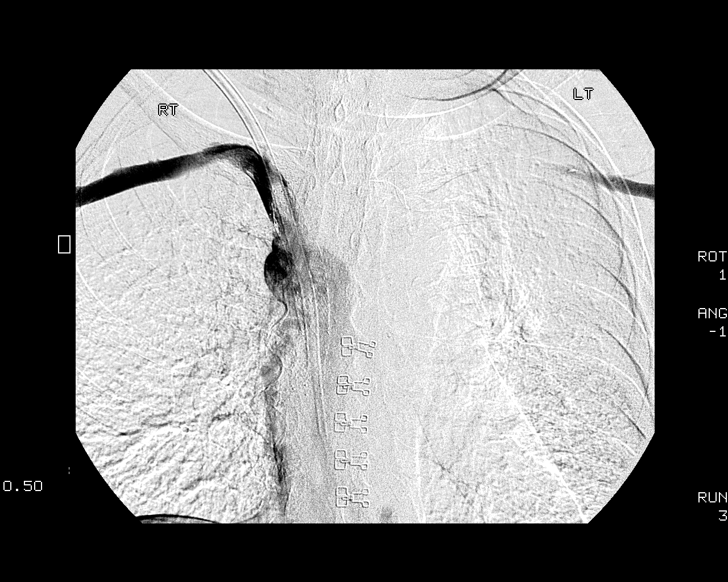
[im 32/50]
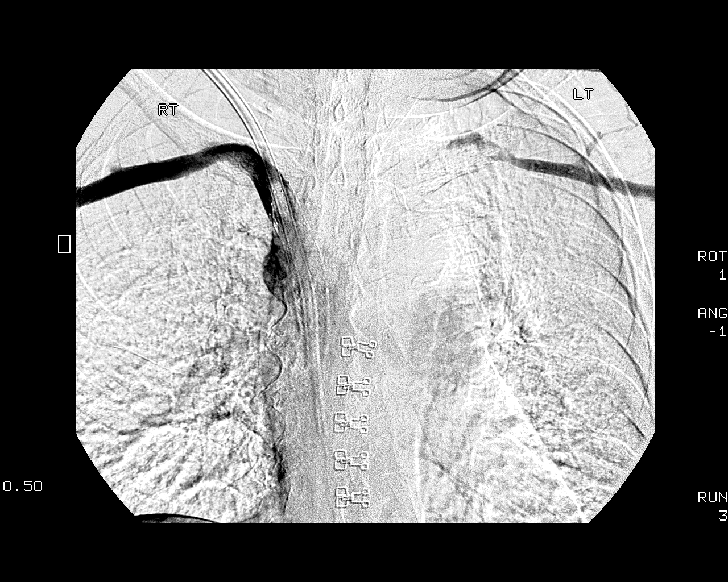
[im 37/50]
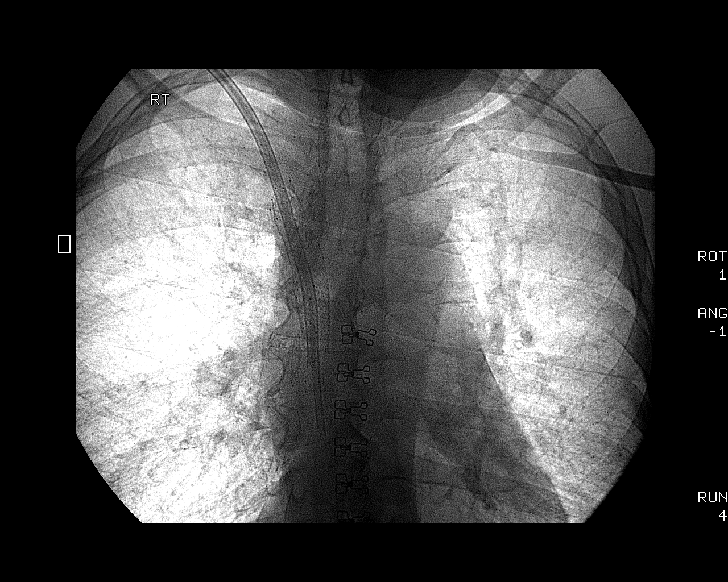
[im 41/50]
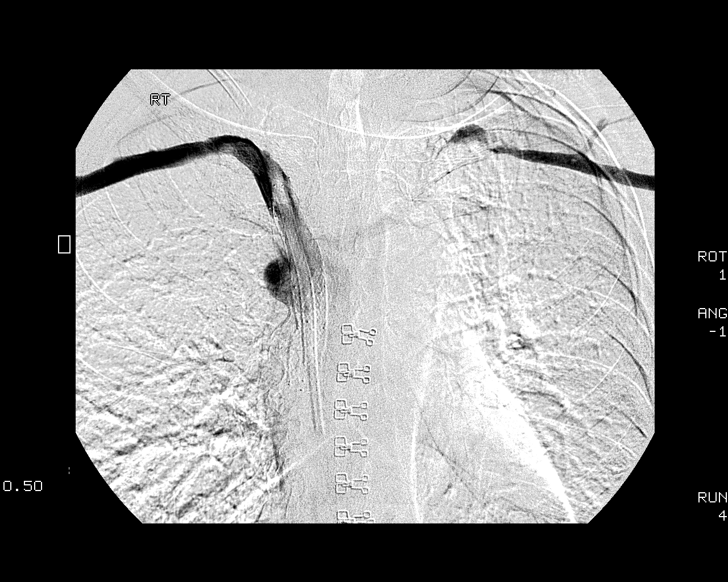
[im 45/50]
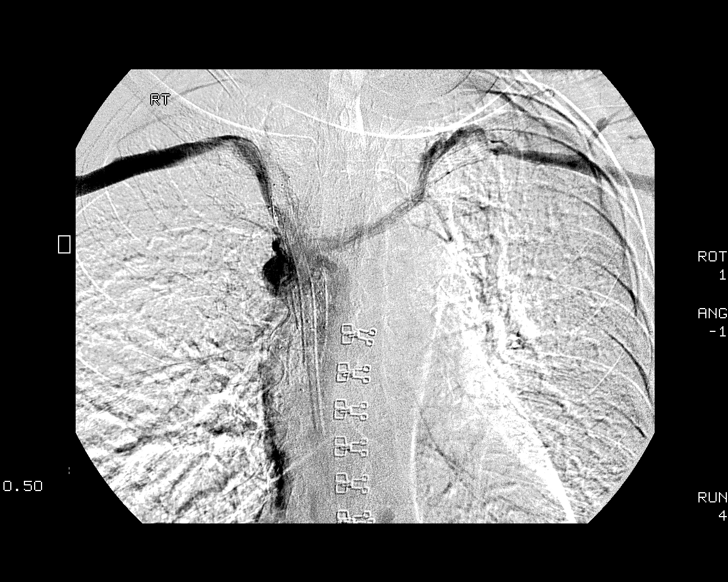
[im 50/50]
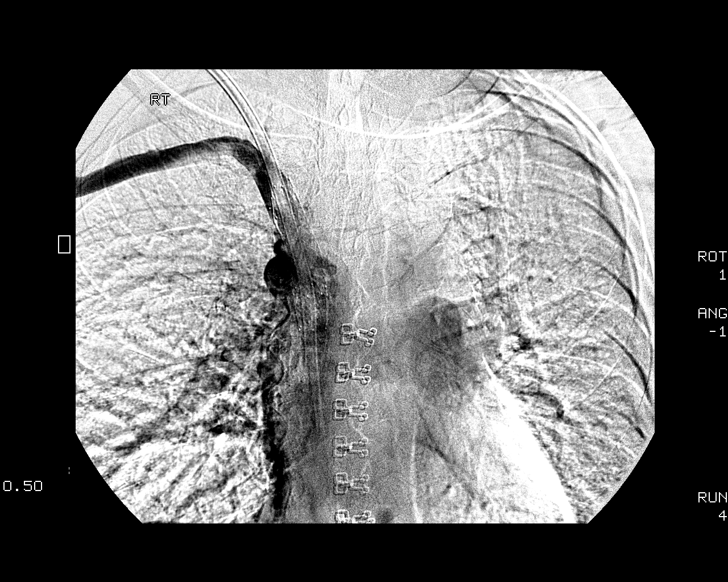

[12 of 24 positions shown; findings below may reference images not displayed]

PROCEDURE(S): BILATERAL CENTRAL VENOGRAMS; ULTRASOUND GUIDANCE FOR
VASCULAR ACCESS.

Medications:The patient has a contrast allergy received the 13 hour
steroid prophylaxis.

Sedation time:None

Fluoroscopy time: 0.9 minutes

Contrast:  80 ml Omnipaque

Procedure:The patient was placed supine on the interventional
table.  Ultrasound was used to evaluate both upper arms.  The
patient was found to have a patent right brachial vein.  The arm
was prepped and draped in a sterile fashion.  The skin and
subcutaneous tissues were anesthetized with 1% lidocaine.  A 21
gauge needle was directed into the brachial vein with ultrasound
guidance and a micropuncture dilator set was placed.  The left arm
was also found have a patent left brachial vein with ultrasound.
Left arm was prepped and draped in a sterile fashion.  The skin was
anesthetized with 1% lidocaine.  21 gauge needle was directed into
the left brachial vein with ultrasound guidance.  Micropuncture
dilator set was placed.  Bilateral central venograms were
performed.  Catheters were removed with manual compression.
FINDINGS: The patient has a right jugular dialysis catheter with the
tip at the cavoatrial junction.  The patient also has a metallic
stent that involves the right innominate vein and extends into the
superior vena cava.  The right axillary and subclavian veins are
patent.  The metallic stent appears to be occluded and the central
drainage appears to be involving the azygos venous system.

The patient has multiple collateral formations in the left upper
arm.  The left axillary vein and left subclavian vein are small but
patent.  There is a critical stenosis or thrombus in the left
subclavian vein at the level of the left clavicle.  The left
innominate vein is small but patent.
IMPRESSION: Dialysis catheter extends through the central metallic
stents.  There is no significant flow through this stent and the
central drainage is predominately through the azygos system.

Right subclavian vein critical stenosis or chronic thrombus as
described.

## 2010-05-20 IMAGING — CR DG CHEST 2V
2 series · 2 of 2 positions shown · non-contrast
Comparison: Chest x-ray of 12/22/2006

CLINICAL DATA: Preop for dialysis catheter insertion

CHEST - 2 VIEW

[view not recorded (1 of 2)]
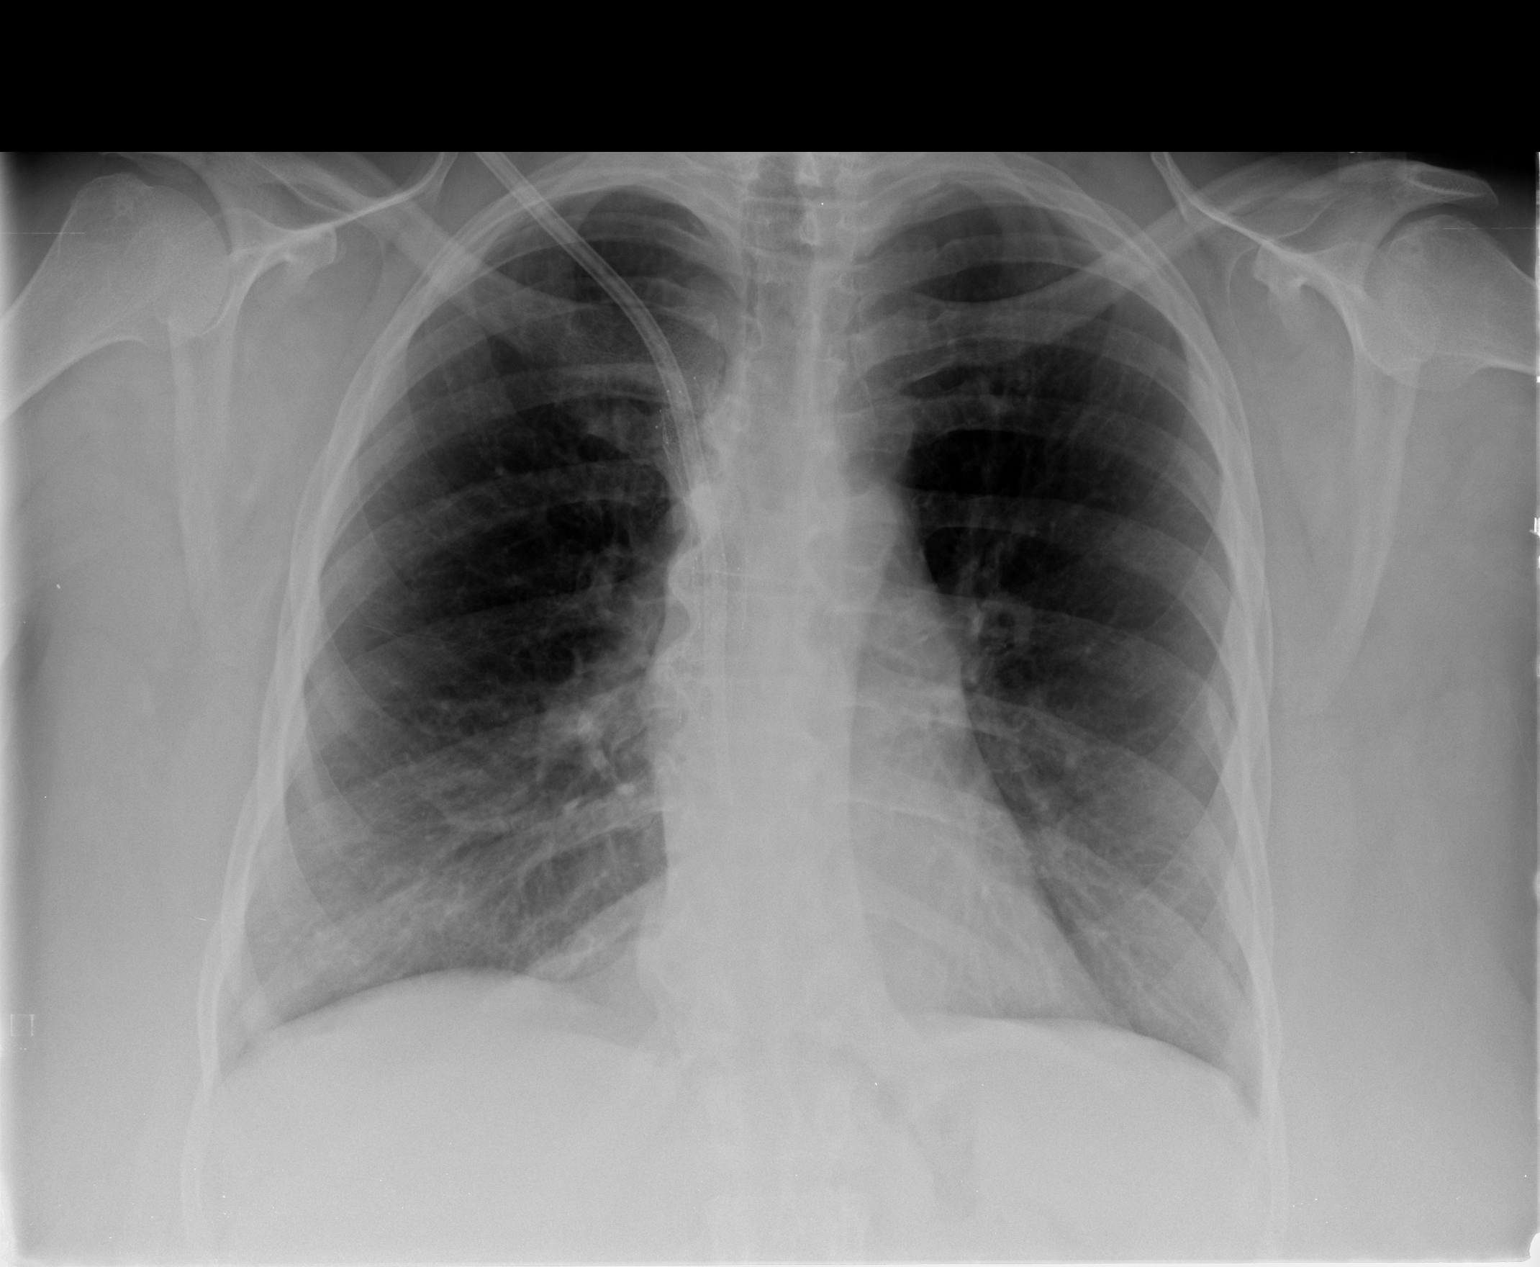

[view not recorded (2 of 2)]
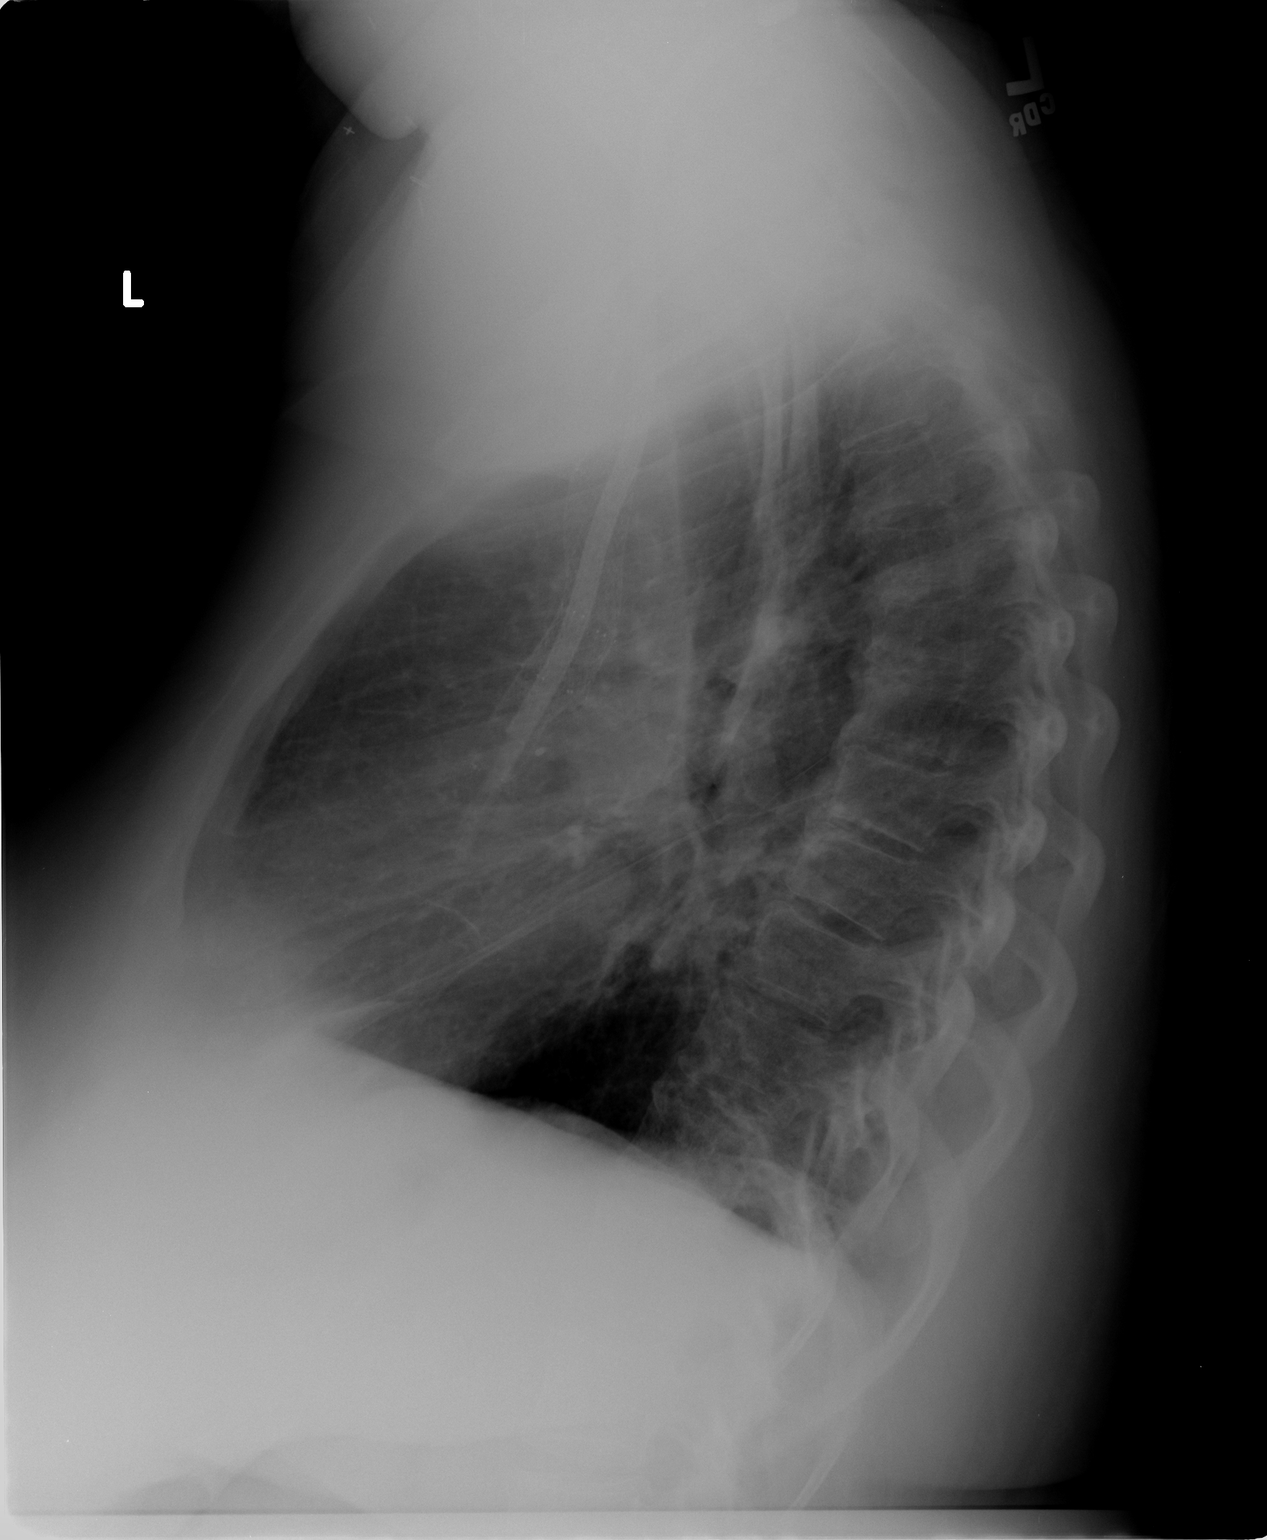

[2 of 2 positions shown; findings below may reference images not displayed]

FINDINGS: Right dialysis catheter is present through the SVC
vascular stent with the tip in the lower SVC near the RA junction.
The lungs are clear.  Heart is within normal limits in size.
IMPRESSION: Right-sided dialysis catheter tips in lower SVC.  No pneumothorax.

## 2010-05-21 IMAGING — CR DG CHEST 1V PORT
1 series · 1 of 1 positions shown · non-contrast
Comparison: 01/06/2009

CLINICAL DATA: Status post dialysis catheter placement.

PORTABLE CHEST - 1 VIEW

[view not recorded]
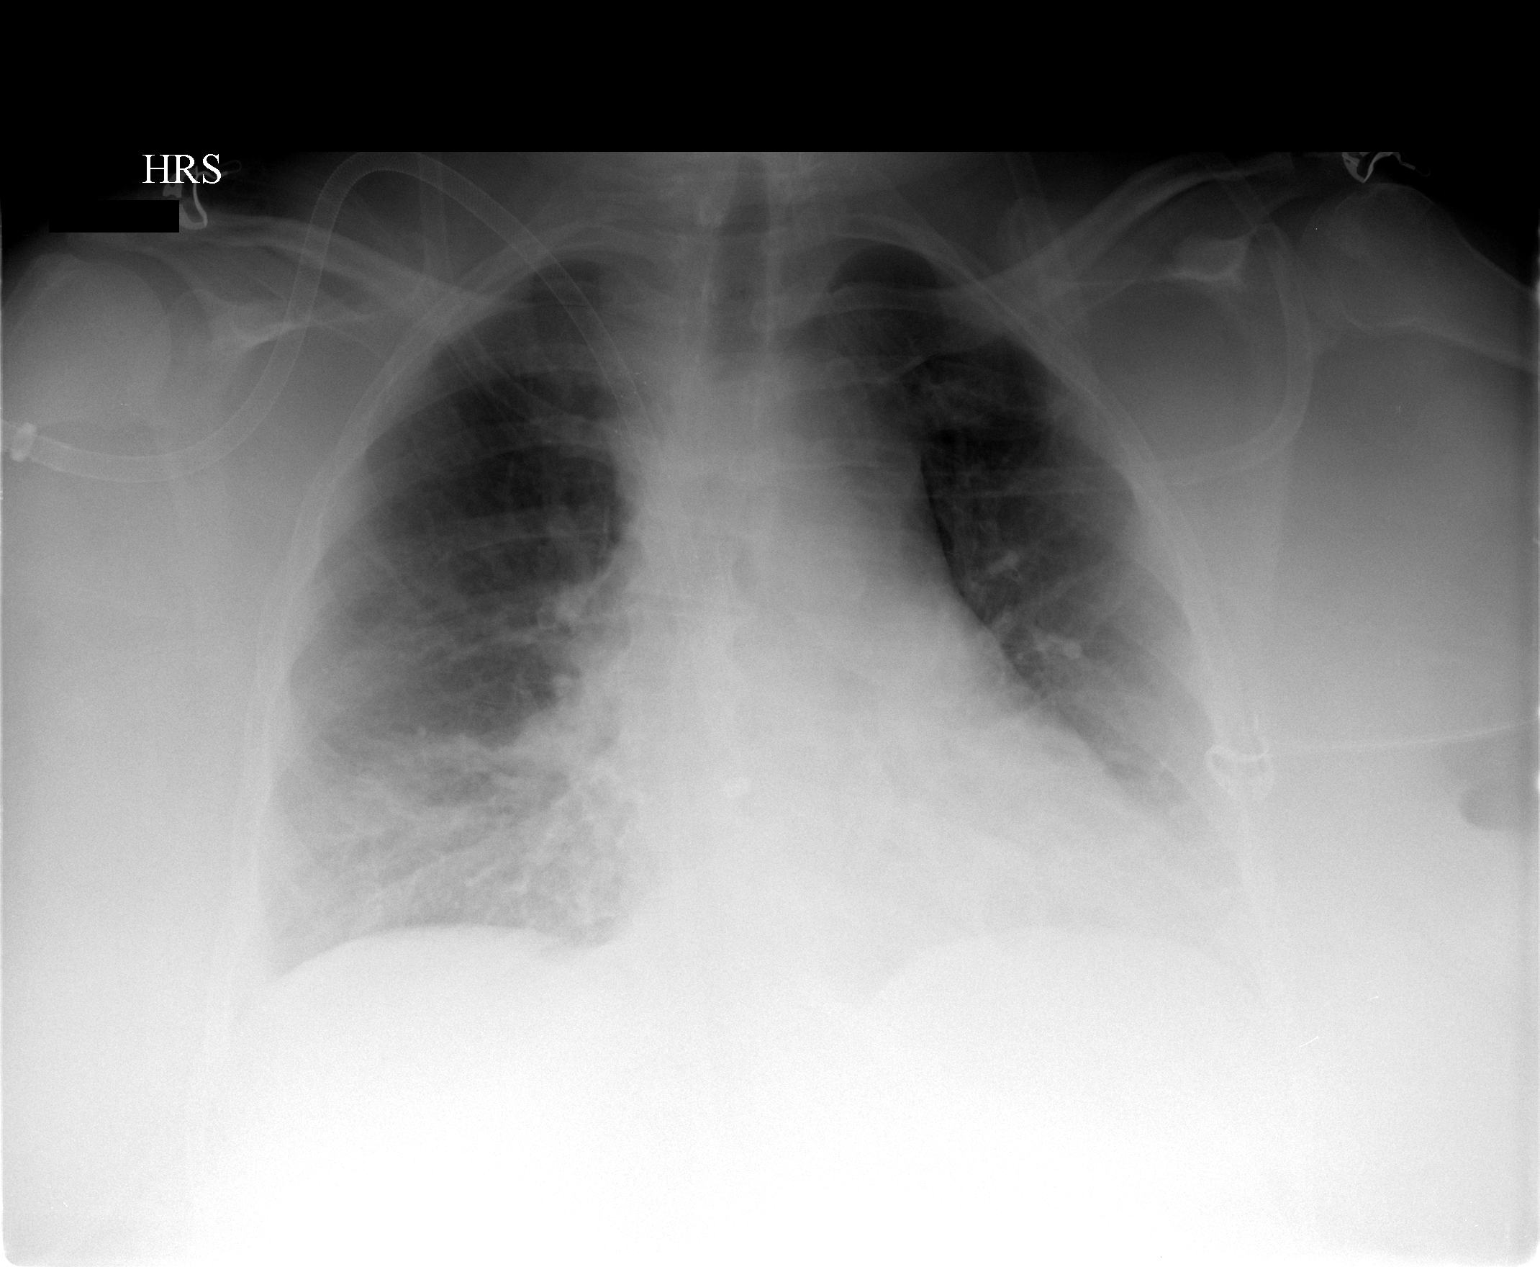

[1 of 1 positions shown; findings below may reference images not displayed]

FINDINGS: A right IJ dialysis catheter is in place.  The tip is at
or just beyond the cavoatrial junction, likely in the right eighth.
There is no pneumothorax.  Lung volumes are low.  Heart is
enlarged.  There is mild edema. A second dialysis catheter enters
from the IVC and terminates in the right atrium.
IMPRESSION: 1.  Status post placement of right IJ dialysis catheter with the
tip in the right atrium.
2.  Low lung volumes and mild pulmonary edema.
3.  A second dialysis catheter enters via the IVC and terminates in
the right atrium.

## 2010-05-21 IMAGING — CR DG ABD PORTABLE 1V
2 series · 2 of 2 positions shown · non-contrast
Comparison: Chest x-ray of same day.

CLINICAL DATA: Dialysis catheter placement.

ABDOMEN - 1 VIEW

[view not recorded (1 of 2)]
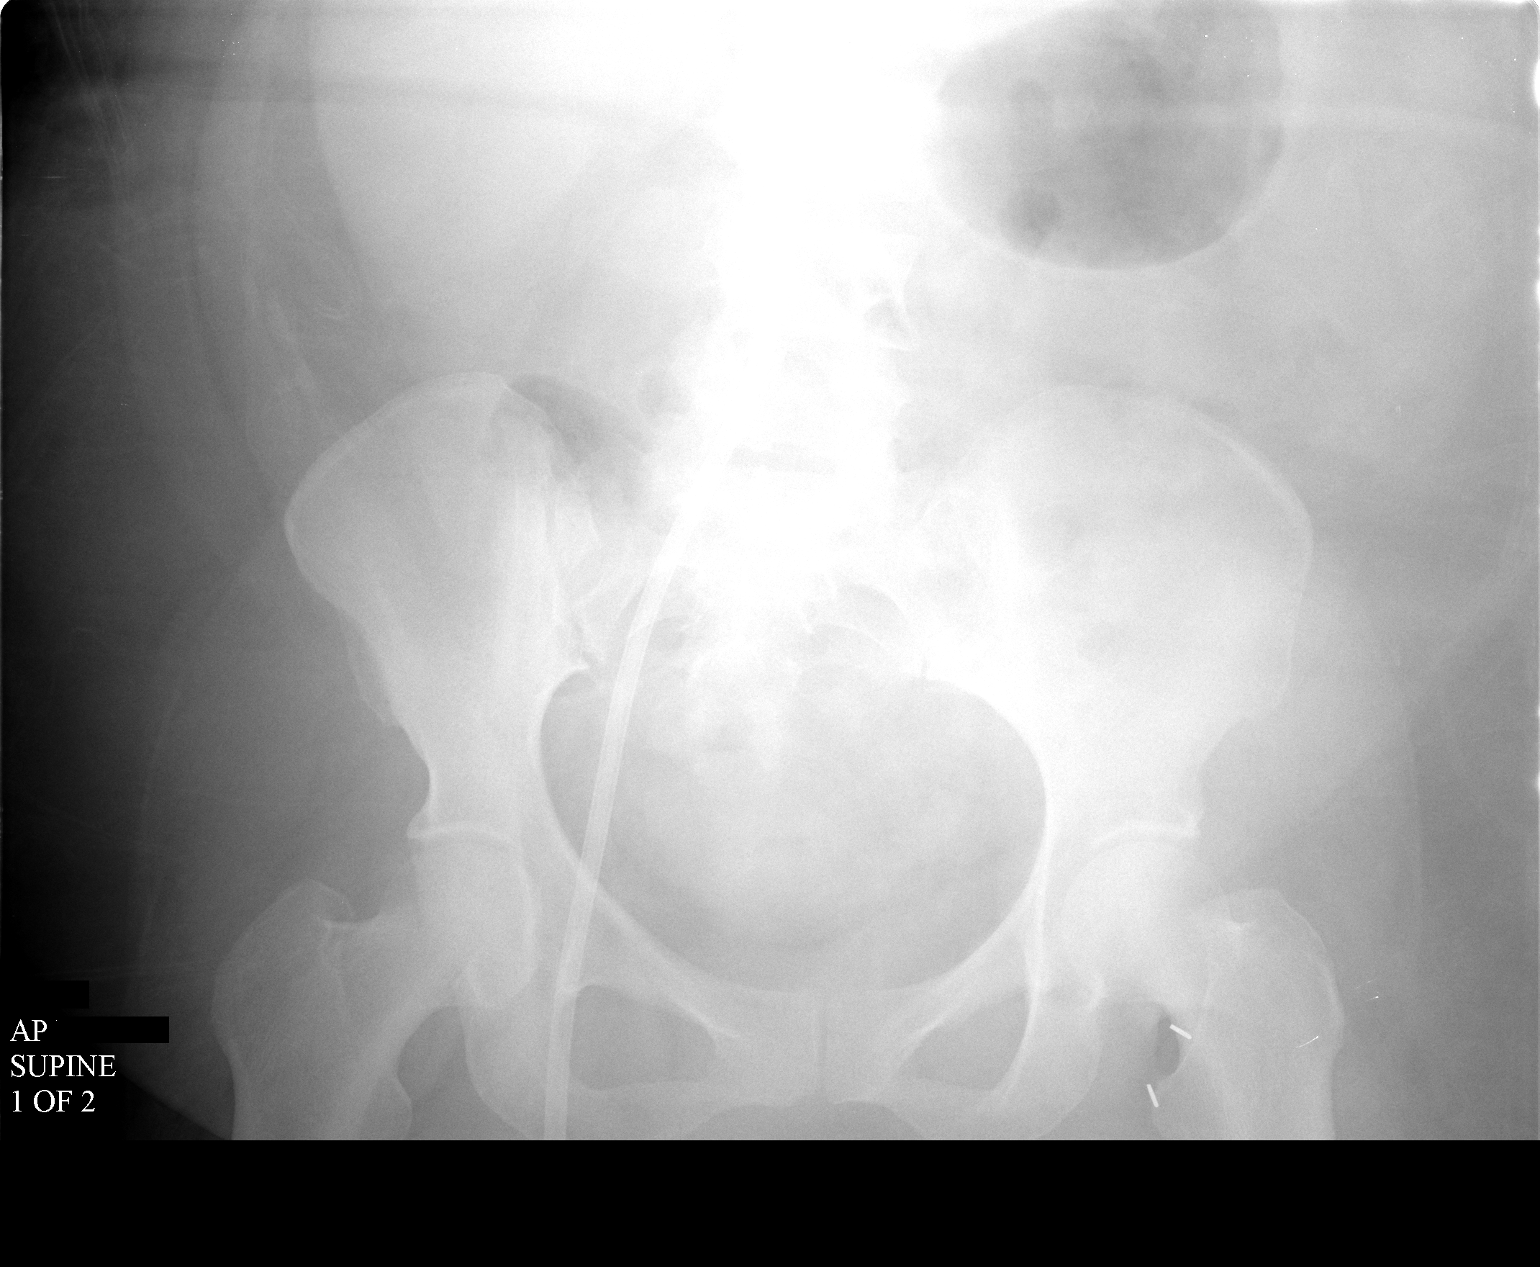

[view not recorded (2 of 2)]
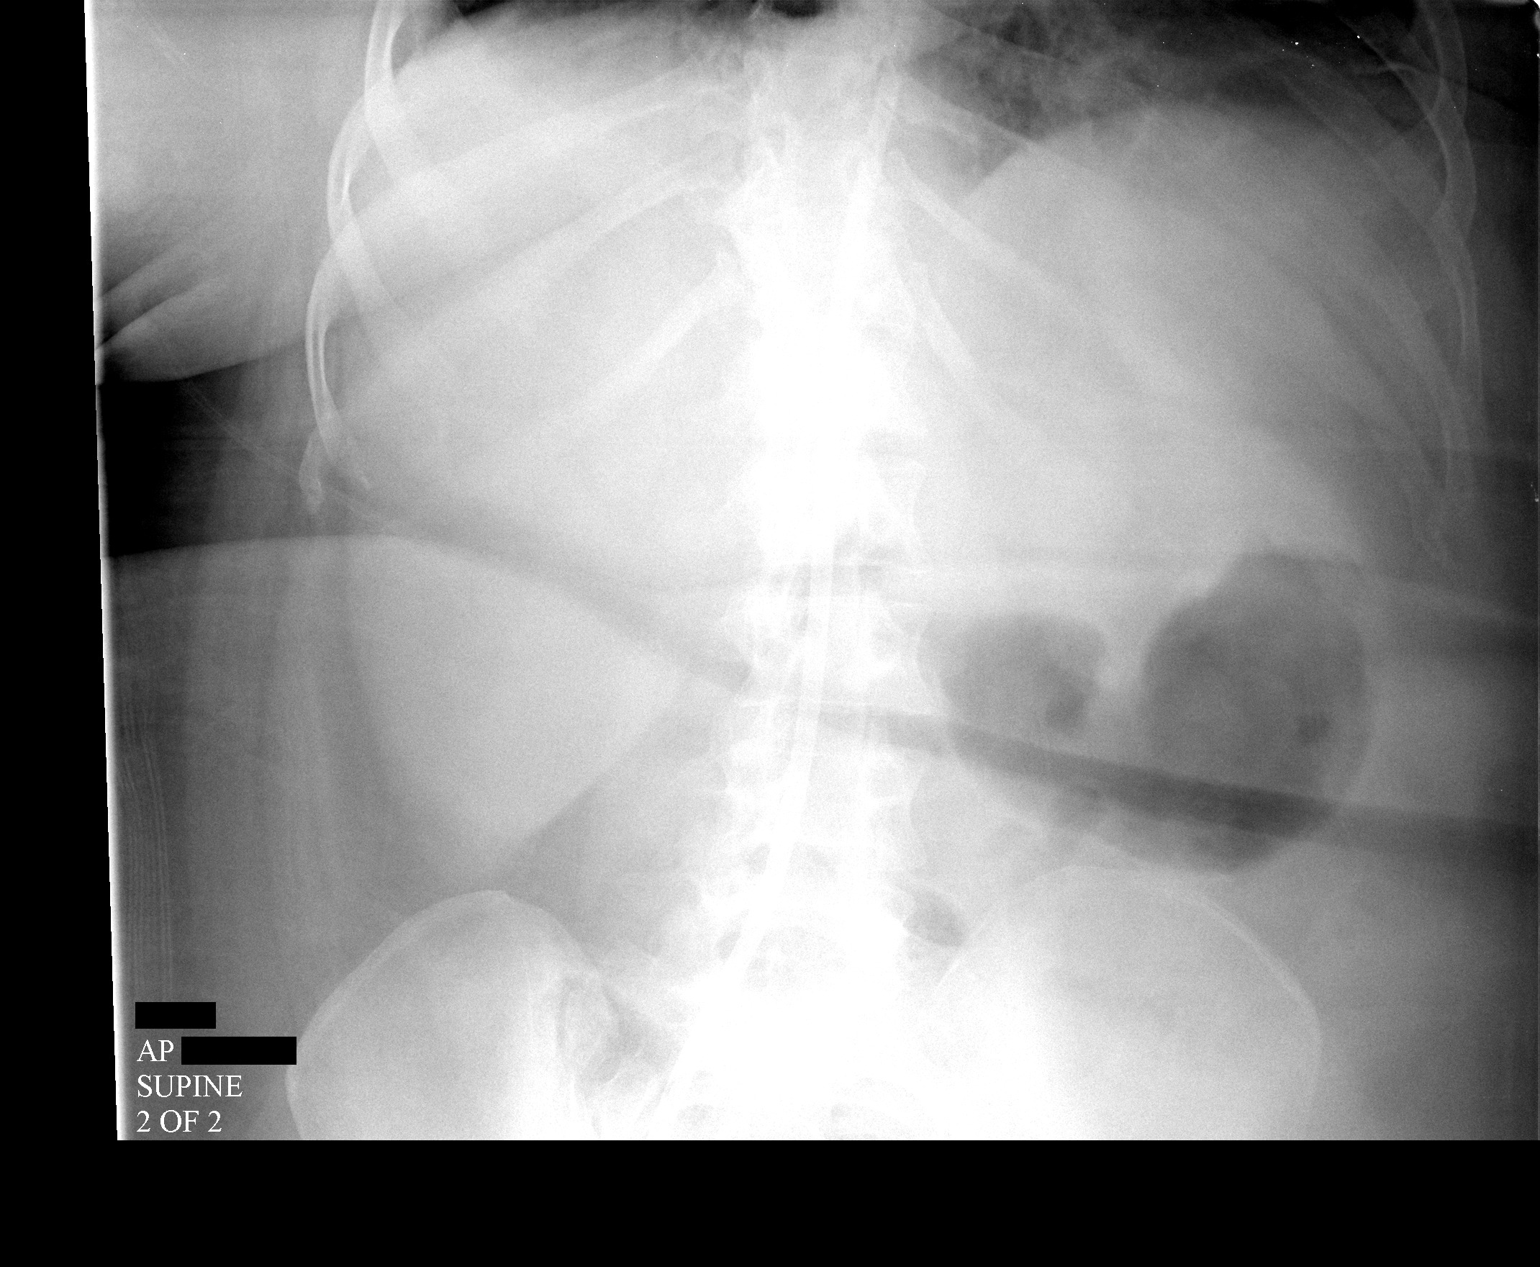

[2 of 2 positions shown; findings below may reference images not displayed]

FINDINGS: A right frontal dialysis catheter is in place.  The tip
is not seen on the STIR images.  The bowel gas pattern is
unremarkable.
IMPRESSION: Right femoral dialysis catheter.  The tip is not imaged.  Please
see chest report of same date.

## 2010-07-07 ENCOUNTER — Ambulatory Visit (HOSPITAL_COMMUNITY): Admission: RE | Admit: 2010-07-07 | Discharge: 2010-07-07 | Payer: Self-pay | Admitting: Nephrology

## 2010-07-19 ENCOUNTER — Ambulatory Visit (HOSPITAL_COMMUNITY): Admission: RE | Admit: 2010-07-19 | Discharge: 2010-07-19 | Payer: Self-pay | Admitting: Nephrology

## 2010-07-20 IMAGING — XA IR AV DIALYSIS GRAFT DECLOT
1 series · 15 of 24 positions shown · non-contrast
Comparison: None

Addendum Begins

After the repositioning of the HeRO catheter tip, the right femoral
sheath was exchanged over a stiff glidewire for a new 55 cm
HemoSplit hemodialysis catheter, positioned with its tips in the
lower right atrium.  The catheter was flushed per protocol and
secured externally with zero Prolene sutures.
Impression addendum:
4.  Successful exchange of right femoral tunneled hemodialysis
catheter.
Addendum Ends
CLINICAL DATA: Occluded right arm HeRO catheter.
ARTERIOVENOUS DIALYSIS GRAFT DECLOT,ULTRASOUND VENOUS
ACCESS,ARTERIOVENOUS SHUNT FOR DIALYSIS; ADDITIONAL ACCESS,PTA
VENOUS,CENTRAL VENOUS CATHETER WITH FLUOROSCOPY
,
TECHNIQUE: The graft just central to the arterial anastomosis was
accessed antegrade with 21-gauge micropuncture needle under real-
time ultrasonic guidance after the overlying skin prepped with
Betadine, draped in usual sterile fashion, infiltrated locally with
1% lidocaine.  Needle exchanged over 018 guidewire for transitional
dilator through which 2 mg t-PA was administered. In similar
fashion, the graft slightly more centrally was accessed retrograde
under ultrasound with a micropuncture needle, exchanged for a
transitional dilator. Ultrasound images were stored. Through the
antegrade dilator, a Bentson wire was advanced centrally. Over this
a 6F sheath was placed, through which a 5 French Kumpe catheter was
advanced for outflow venography. This showed that the intravenous
tip of the catheter had pulled back 4-5 cm from its placement on
previous chest radiograph of 01/07/2009.  The SVC and associated
stents were   thrombosed below the tip of the catheter, with
retrograde flow into the azygos venous system providing drainage of
the upper extremities.  The images were reviewed with Dr. Charanjit
and a plan was formulated.

[Series 1: run · 15 of 65 slices shown]
[im 1/65]
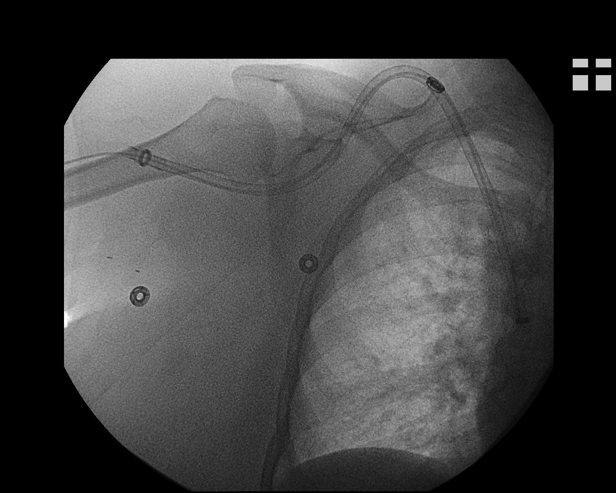
[im 6/65]
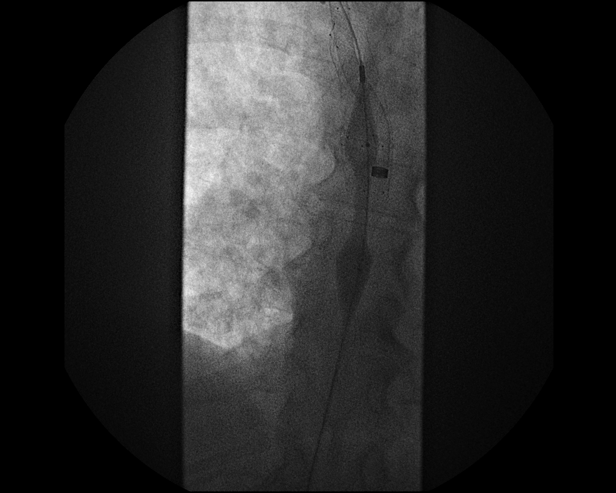
[im 12/65]
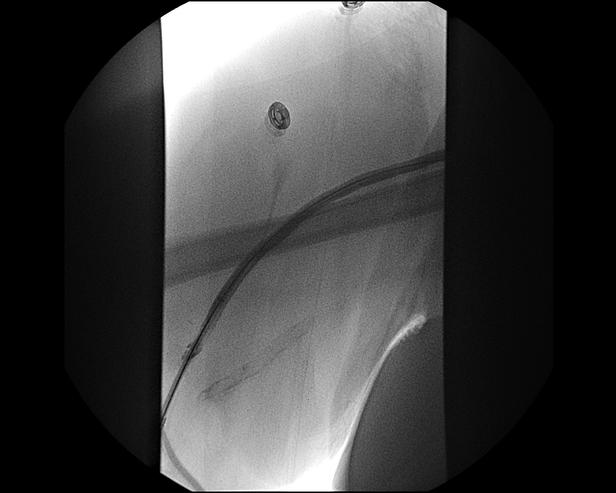
[im 14/65]
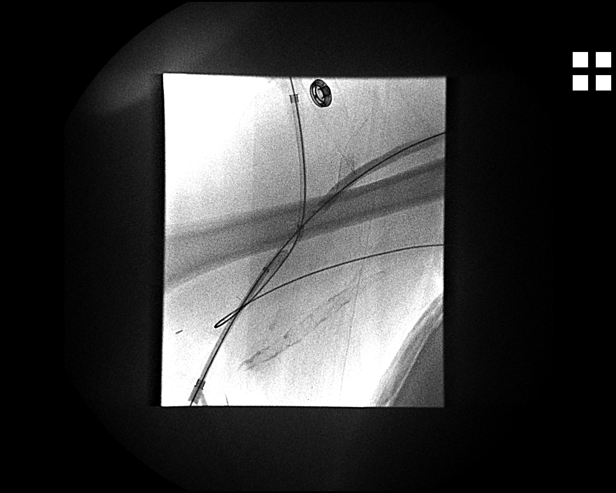
[im 20/65]
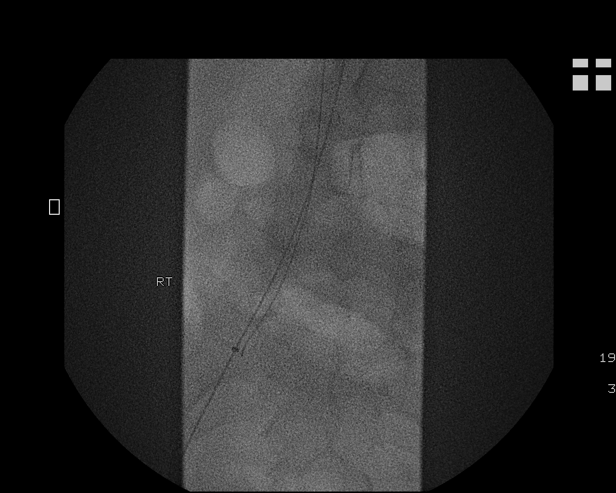
[im 23/65]
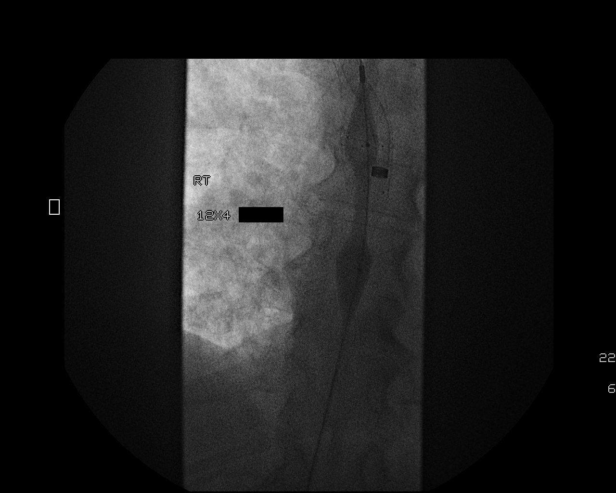
[im 28/65]
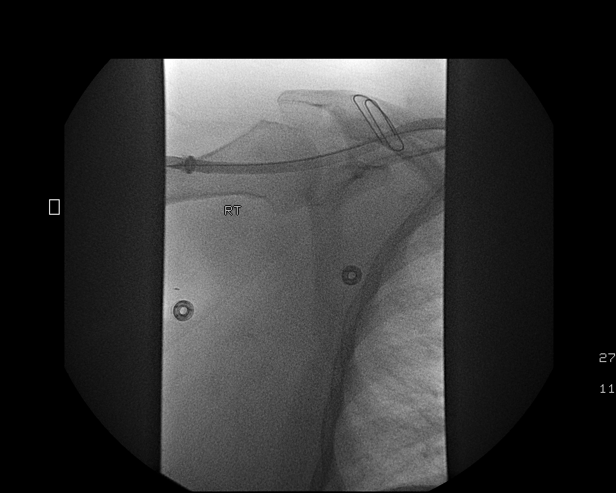
[im 34/65]
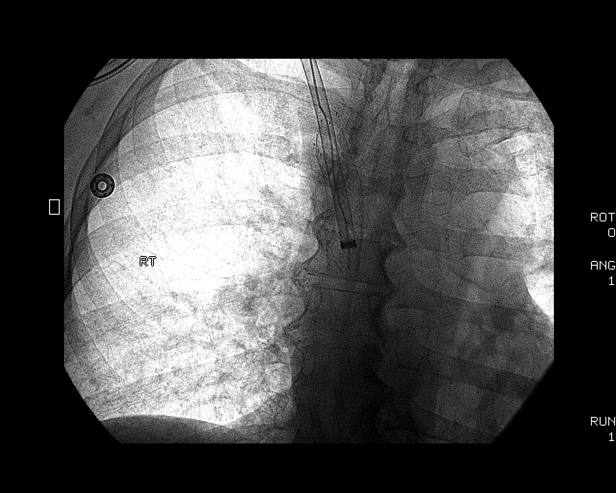
[im 37/65]
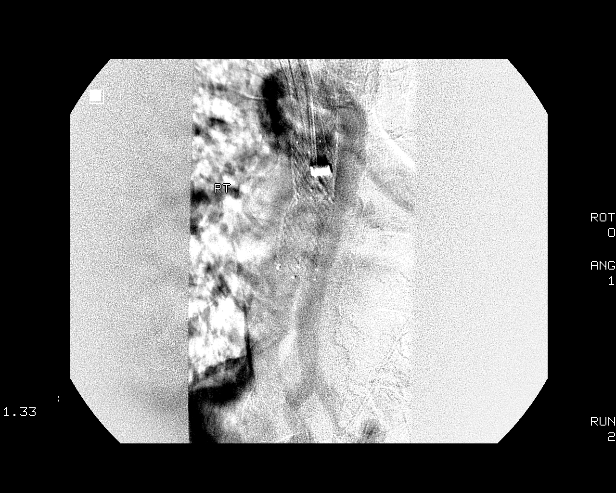
[im 42/65]
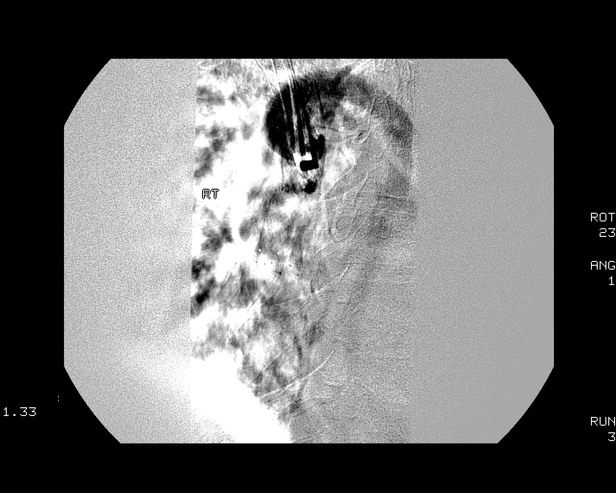
[im 45/65]
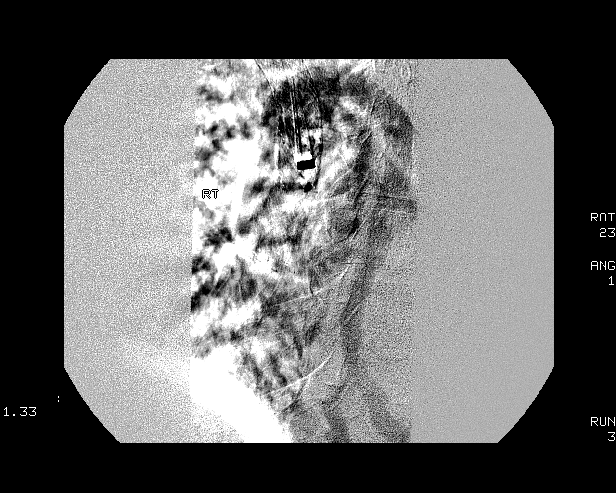
[im 51/65]
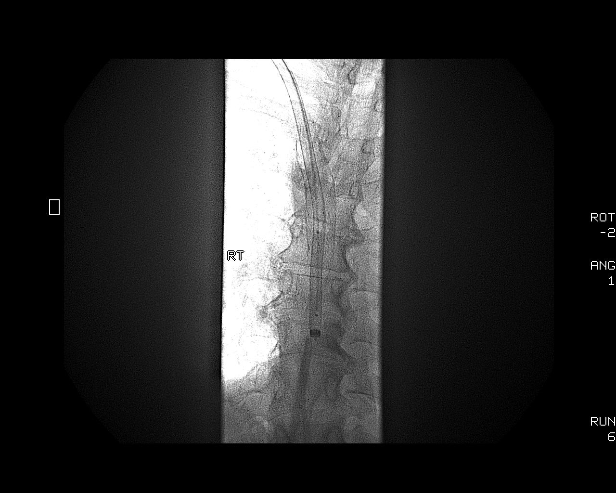
[im 56/65]
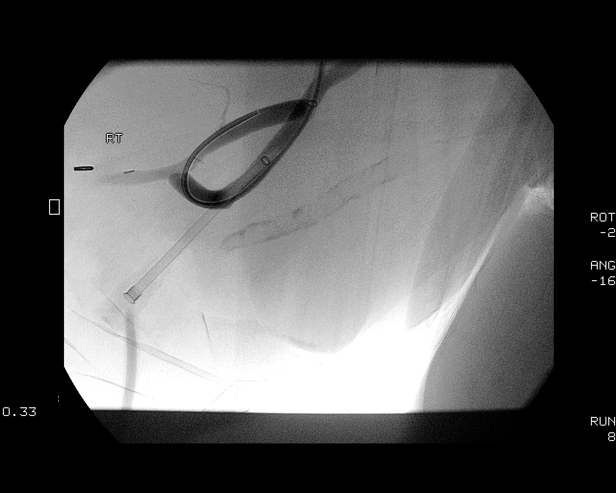
[im 59/65]
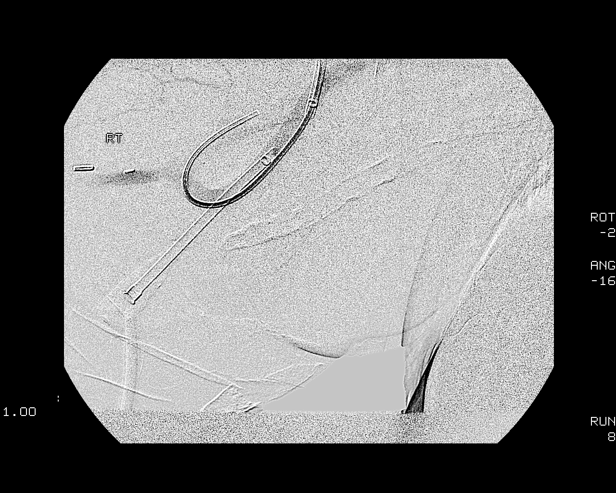
[im 65/65]
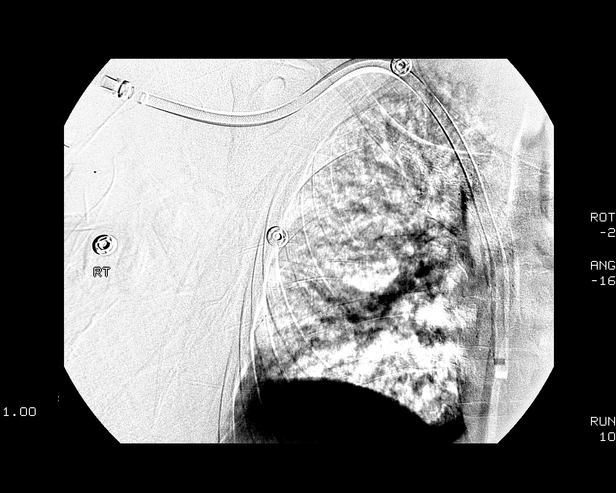

[15 of 24 positions shown; findings below may reference images not displayed]

The through the angiographic catheter an angled glide wire was
advanced and this was used to traverse the SVC occlusion.

The right femoral tunneled hemodialysis catheter and surrounding
skin were then prepped with betadine, draped in usual sterile
fashion, infiltrated locally with 1% lidocaine.  The right femoral
tunneled hemodialysis catheter was cut and dissected free of the
subcutaneous tissues and removed over an angled glide wire.  A 9-
French vascular sheath was placed.  The tulip snare device was
advanced and used to snare the guide wire passed through the HeRO
catheter into the IVC.  This allowed retrograde passage of a 5-
French angiographic catheter   across the SVC occlusion.  A 12 mm
Atlas angioplasty balloon would not advance across the occlusion.
For this reason, a 5 mm Nivirus angioplasty balloon was advanced and
used to predilate the lesion.  This facilitated advancement of the
12 mm Atlas angioplasty balloon, allowing more complete dilatation
of the area of the SVC thrombosis and occlusion.  The use of the 12
mm balloon   adjacent to the tip of the HeRO catheter resulted in
some apparent mild distortion of the metal marker at the tip the
catheter, so the 5 mm Dorado balloon was advanced through the HeRO
catheter and inflated at the  tip.  The   snare from a right
femoral approach was advanced over the Dorado balloon and catheter,
and used to pull the tip of the HeRO  catheter back down to the
proximal right atrium.  The snare was disengaged and removed.
Through its sheath, contrast injection was performed demonstrating
good position of the HeRO catheter tip.

6999 units heparin were administered IV. The Kumpe was exchanged
over guidewire for the AngioJet device, used to mechanically
thrombolyse the graft and remove the thrombus through the outflow.
Injection showed   clearance of thrombus from the graft. No
extravasation or apparent complication of angioplasty.  The venous
limb dilator was exchanged in similar fashion for a 6 French
vascular sheath. The Fogarty catheter   was advanced over a Bentson
wire across the arterial anastomosis and used to dislodge the
platelet plug from the arterial anastomosis.   Followup shuntogram
showed   a partially occlusive filling defect in the graft near the
entry site of the retrograde sheath.  This did not clear with
localized use of the AngioJet device nor with 5 mm balloon
maceration.  Ultimately the AngioJet catheter was utilized
throughout the length of the graft to clear this residual piece of
presumed platelet plug. Reflux across the arterial anastomosis
showed this to be widely patent with unremarkable native arterial
circulation.  Final outflow venography shows wide patency of the
HeRO catheter to the proximal right atrium.  The catheter and
sheaths were then removed and hemostasis achieved with 2-0 Ethilon
sutures. Patient tolerated procedure well.
IMPRESSION: 1. Technically successful declot of right upper extremity Yongil
hemodialysis graft.

2. Technically successful balloon angioplasty of the SVC occlusion.
3.  Successful repositioning of the tip of the HeRO catheter to the
proximal right atrium.

Access management: Should be the tip migrate retrograde in the
short term, surgical revision will likely be required.

## 2010-07-20 IMAGING — US IR AV DIALYSIS GRAFT DECLOT
1 series · 1 of 1 positions shown · non-contrast
Comparison: None

Addendum Begins

After the repositioning of the HeRO catheter tip, the right femoral
sheath was exchanged over a stiff glidewire for a new 55 cm
HemoSplit hemodialysis catheter, positioned with its tips in the
lower right atrium.  The catheter was flushed per protocol and
secured externally with zero Prolene sutures.
Impression addendum:
4.  Successful exchange of right femoral tunneled hemodialysis
catheter.
Addendum Ends
CLINICAL DATA: Occluded right arm HeRO catheter.
ARTERIOVENOUS DIALYSIS GRAFT DECLOT,ULTRASOUND VENOUS
ACCESS,ARTERIOVENOUS SHUNT FOR DIALYSIS; ADDITIONAL ACCESS,PTA
VENOUS,CENTRAL VENOUS CATHETER WITH FLUOROSCOPY
,
TECHNIQUE: The graft just central to the arterial anastomosis was
accessed antegrade with 21-gauge micropuncture needle under real-
time ultrasonic guidance after the overlying skin prepped with
Betadine, draped in usual sterile fashion, infiltrated locally with
1% lidocaine.  Needle exchanged over 018 guidewire for transitional
dilator through which 2 mg t-PA was administered. In similar
fashion, the graft slightly more centrally was accessed retrograde
under ultrasound with a micropuncture needle, exchanged for a
transitional dilator. Ultrasound images were stored. Through the
antegrade dilator, a Bentson wire was advanced centrally. Over this
a 6F sheath was placed, through which a 5 French Kumpe catheter was
advanced for outflow venography. This showed that the intravenous
tip of the catheter had pulled back 4-5 cm from its placement on
previous chest radiograph of 01/07/2009.  The SVC and associated
stents were   thrombosed below the tip of the catheter, with
retrograde flow into the azygos venous system providing drainage of
the upper extremities.  The images were reviewed with Dr. Charanjit
and a plan was formulated.

[Series 1: ir av dialysis graft declot · 1 of 1 slices shown]
[im 1/1]
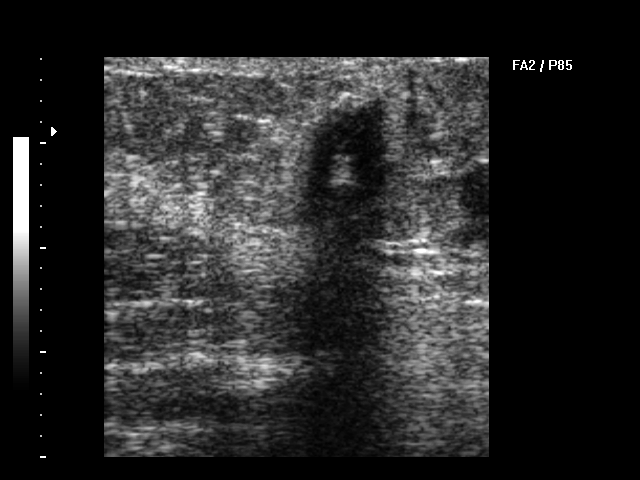

[1 of 1 positions shown; findings below may reference images not displayed]

The through the angiographic catheter an angled glide wire was
advanced and this was used to traverse the SVC occlusion.

The right femoral tunneled hemodialysis catheter and surrounding
skin were then prepped with betadine, draped in usual sterile
fashion, infiltrated locally with 1% lidocaine.  The right femoral
tunneled hemodialysis catheter was cut and dissected free of the
subcutaneous tissues and removed over an angled glide wire.  A 9-
French vascular sheath was placed.  The tulip snare device was
advanced and used to snare the guide wire passed through the HeRO
catheter into the IVC.  This allowed retrograde passage of a 5-
French angiographic catheter   across the SVC occlusion.  A 12 mm
Atlas angioplasty balloon would not advance across the occlusion.
For this reason, a 5 mm Nivirus angioplasty balloon was advanced and
used to predilate the lesion.  This facilitated advancement of the
12 mm Atlas angioplasty balloon, allowing more complete dilatation
of the area of the SVC thrombosis and occlusion.  The use of the 12
mm balloon   adjacent to the tip of the HeRO catheter resulted in
some apparent mild distortion of the metal marker at the tip the
catheter, so the 5 mm Dorado balloon was advanced through the HeRO
catheter and inflated at the  tip.  The   snare from a right
femoral approach was advanced over the Dorado balloon and catheter,
and used to pull the tip of the HeRO  catheter back down to the
proximal right atrium.  The snare was disengaged and removed.
Through its sheath, contrast injection was performed demonstrating
good position of the HeRO catheter tip.

6999 units heparin were administered IV. The Kumpe was exchanged
over guidewire for the AngioJet device, used to mechanically
thrombolyse the graft and remove the thrombus through the outflow.
Injection showed   clearance of thrombus from the graft. No
extravasation or apparent complication of angioplasty.  The venous
limb dilator was exchanged in similar fashion for a 6 French
vascular sheath. The Fogarty catheter   was advanced over a Bentson
wire across the arterial anastomosis and used to dislodge the
platelet plug from the arterial anastomosis.   Followup shuntogram
showed   a partially occlusive filling defect in the graft near the
entry site of the retrograde sheath.  This did not clear with
localized use of the AngioJet device nor with 5 mm balloon
maceration.  Ultimately the AngioJet catheter was utilized
throughout the length of the graft to clear this residual piece of
presumed platelet plug. Reflux across the arterial anastomosis
showed this to be widely patent with unremarkable native arterial
circulation.  Final outflow venography shows wide patency of the
HeRO catheter to the proximal right atrium.  The catheter and
sheaths were then removed and hemostasis achieved with 2-0 Ethilon
sutures. Patient tolerated procedure well.
IMPRESSION: 1. Technically successful declot of right upper extremity Yongil
hemodialysis graft.

2. Technically successful balloon angioplasty of the SVC occlusion.
3.  Successful repositioning of the tip of the HeRO catheter to the
proximal right atrium.

Access management: Should be the tip migrate retrograde in the
short term, surgical revision will likely be required.

## 2010-07-24 ENCOUNTER — Inpatient Hospital Stay (HOSPITAL_COMMUNITY): Admission: EM | Admit: 2010-07-24 | Discharge: 2010-08-01 | Payer: Self-pay | Admitting: Emergency Medicine

## 2010-07-27 ENCOUNTER — Ambulatory Visit: Payer: Self-pay | Admitting: Cardiology

## 2010-07-27 ENCOUNTER — Encounter (INDEPENDENT_AMBULATORY_CARE_PROVIDER_SITE_OTHER): Payer: Self-pay | Admitting: Internal Medicine

## 2010-08-03 DIAGNOSIS — D509 Iron deficiency anemia, unspecified: Secondary | ICD-10-CM | POA: Insufficient documentation

## 2010-08-04 IMAGING — CR DG CHEST 1V PORT
1 series · 1 of 1 positions shown · non-contrast
Comparison: 01/07/2009.

CLINICAL DATA: End-stage renal disease. HeRO catheter insertion.

PORTABLE CHEST - 1 VIEW

[AP]
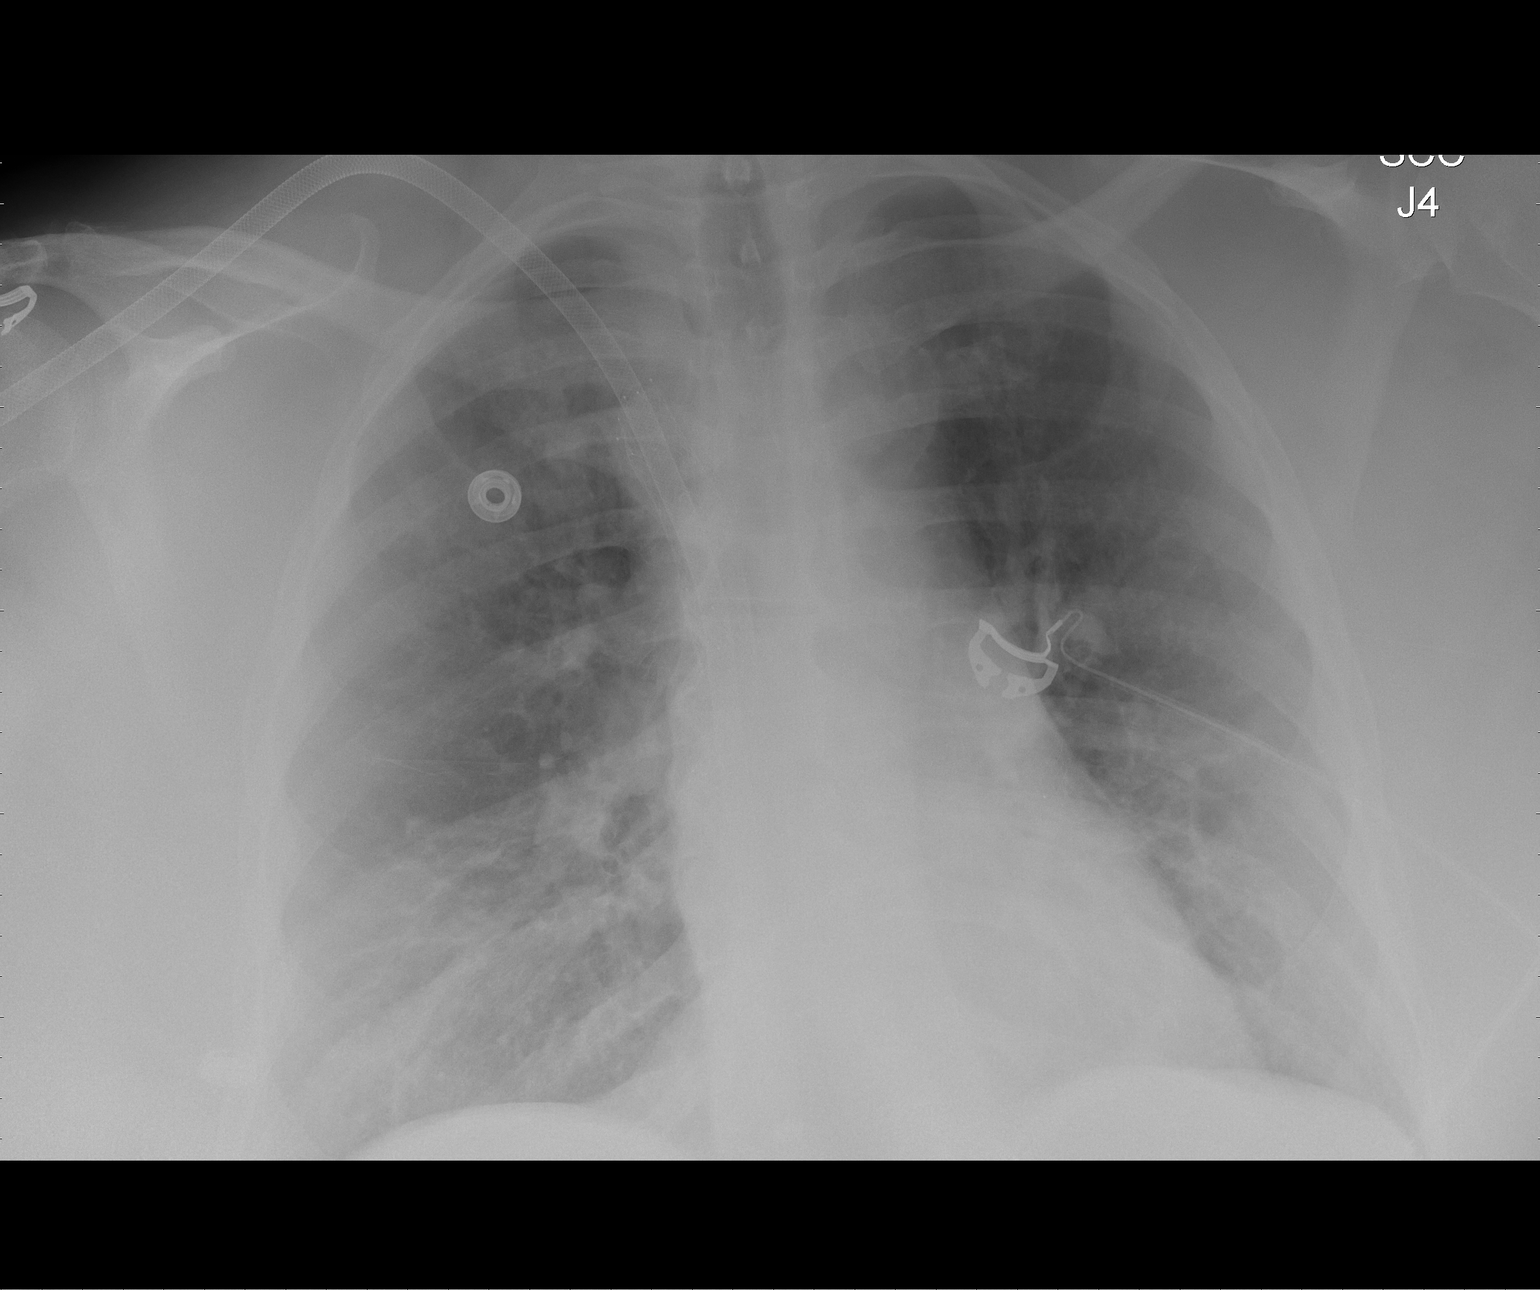

[1 of 1 positions shown; findings below may reference images not displayed]

FINDINGS: 2227 hours.  The right upper extremity Shayla dialysis
catheter is in a similar location to that demonstrated previously,
near the SVC right atrial junction.  SVC stent is noted.  The heart
size and mediastinal contours are stable.  The lungs are clear with
interval improved aeration of the lung bases.  There is no
pneumothorax or significant pleural effusion.
IMPRESSION: Stable hemodialysis catheter position.  Improved basilar aeration.

## 2010-08-06 IMAGING — CR DG CHEST 2V
2 series · 2 of 2 positions shown · non-contrast
Comparison: 03/23/2009

CLINICAL DATA: Short of breath.  Chest pain.  Asthma.

CHEST - 2 VIEW

[w chest pa *]
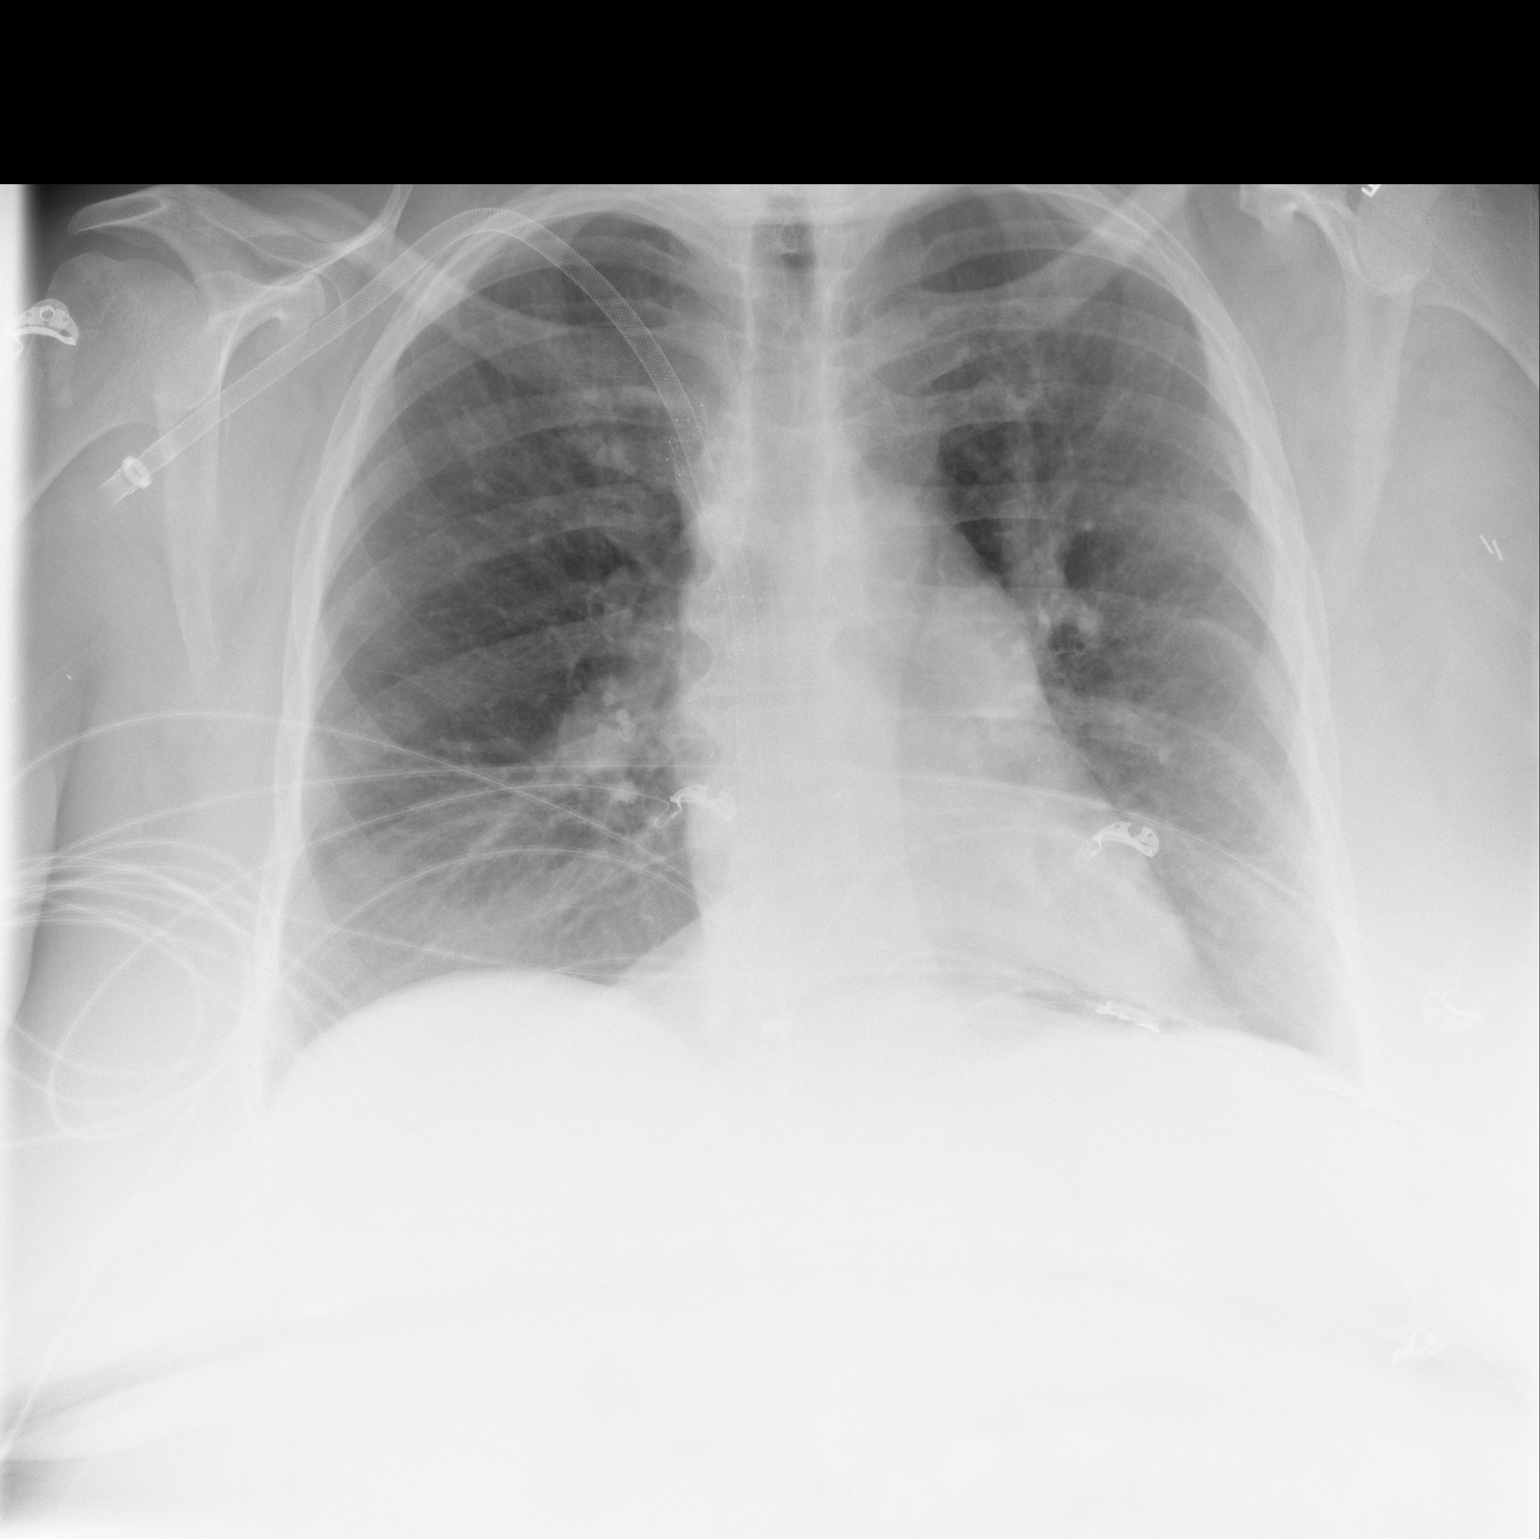

[w chest lat *]
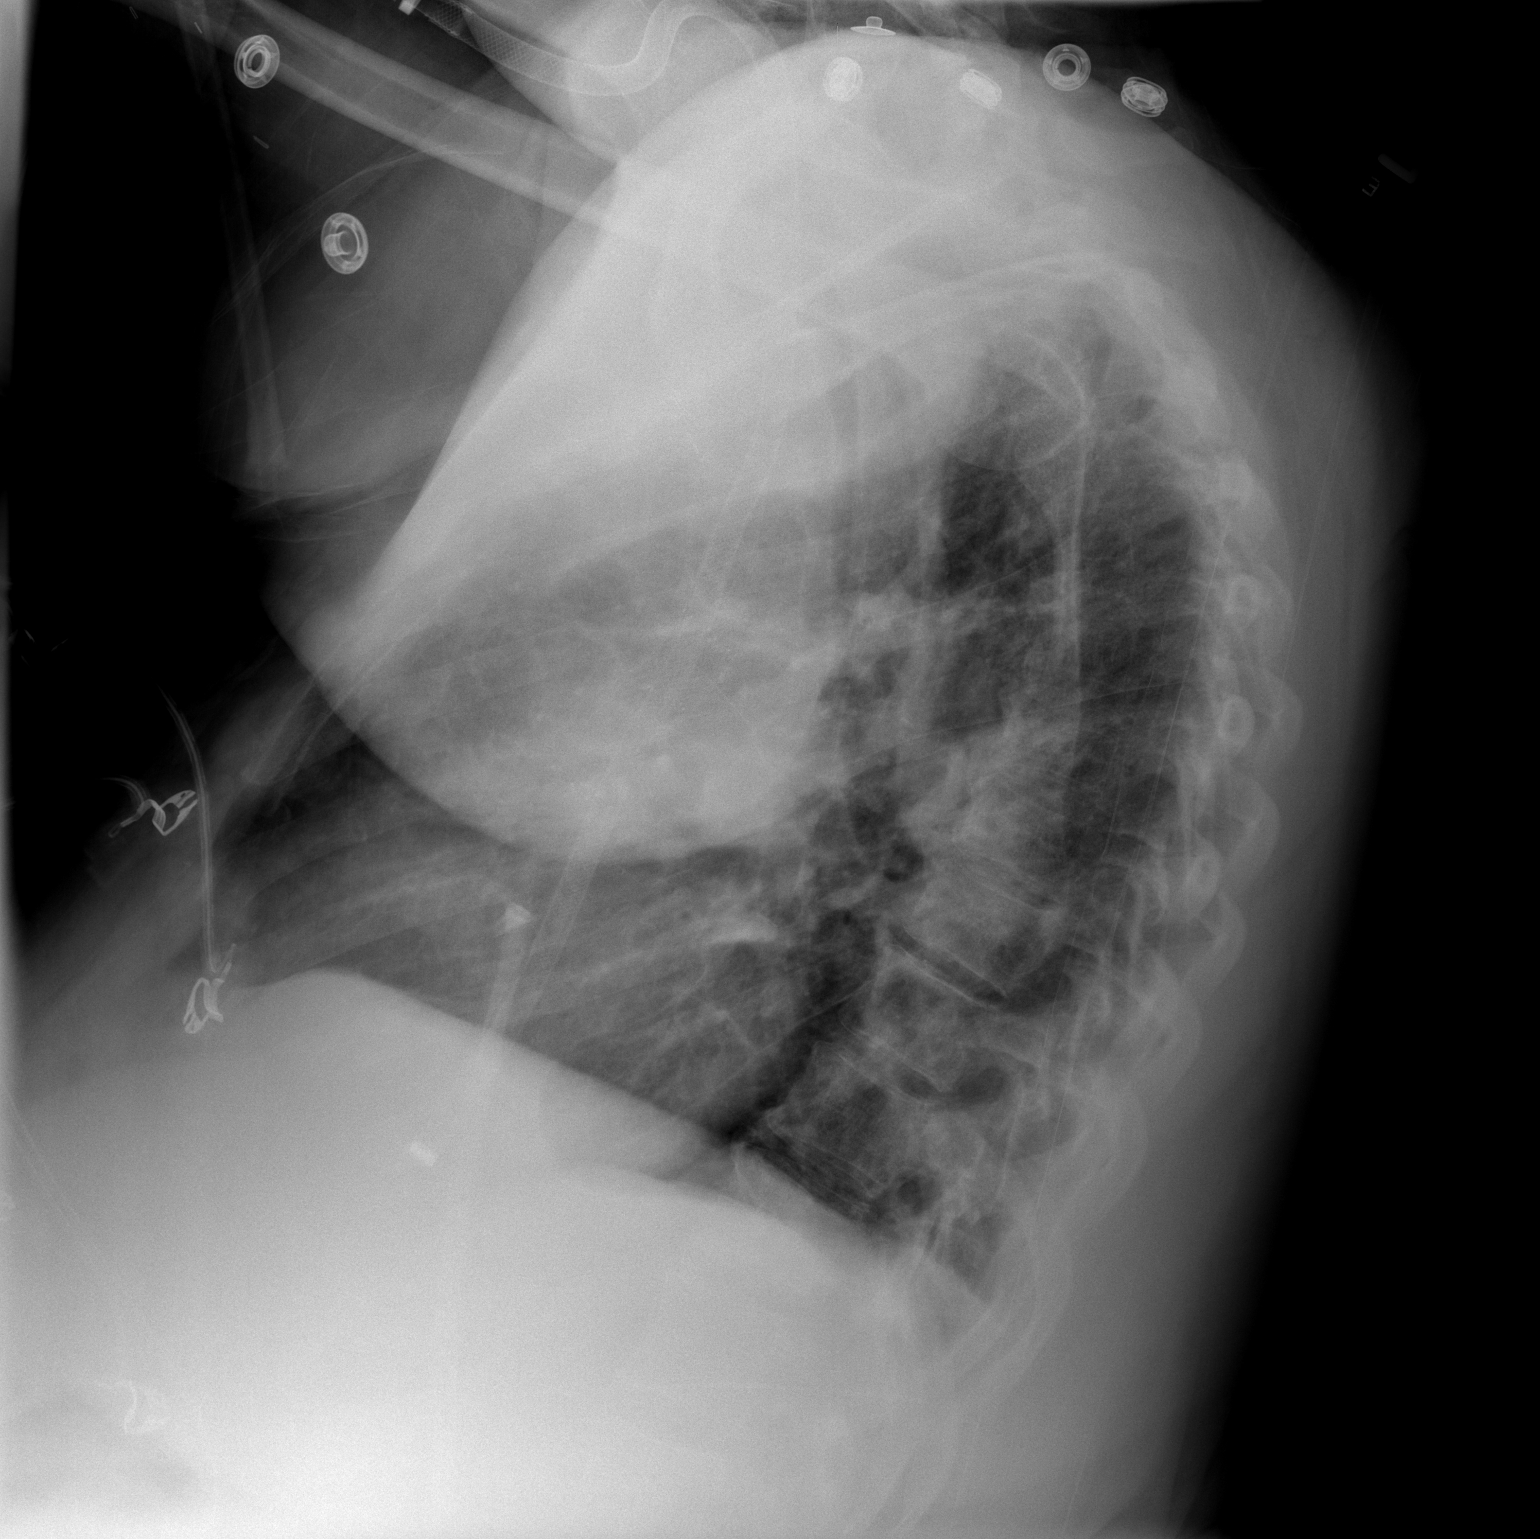

[2 of 2 positions shown; findings below may reference images not displayed]

FINDINGS: Heart size is normal.  Both lungs remain clear.  There is
no evidence of pulmonary edema or consolidation.  There is no
evidence of pleural effusion.  Right-sided center venous catheter
remains in place, as well as stent within the superior vena cava.
IMPRESSION: No active cardiopulmonary disease.

## 2010-08-07 IMAGING — NM NM PULM PERFUSION & VENT (REBREATHING & WASHOUT)
2 series · 12 of 12 positions shown · non-contrast
Comparison: 10/29/2005

CLINICAL DATA: Chest pain and shortness breath.  Cough.

NUCLEAR MEDICINE VENTILATION AND PERFUSION SCAN
TECHNIQUE: Wash-in, equilibrium, and wash-out phase ventilation
images were obtained using Te-JYY gas.  Perfusion images were
obtained in multiple projections after intravenous injection of Tc-
99m MAA.
Radiopharmaceutical: 11.1 mCi xenon-611 and 6.6 mCi technetium 99m
MAA

[vq scan · 2.52mm/px · 6 of 20 frames shown (1 of 2)]
[frame 2/20  full-range]
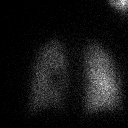
[frame 5/20  full-range]
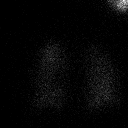
[frame 9/20  full-range]
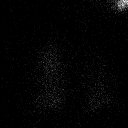
[frame 12/20]
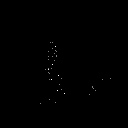
[frame 15/20]
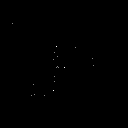
[frame 19/20]
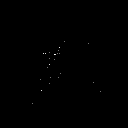

[vq scan · 2.52mm/px · 6 of 20 frames shown (2 of 2)]
[frame 2/20  full-range]
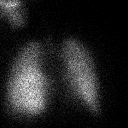
[frame 5/20  full-range]
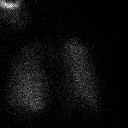
[frame 9/20  full-range]
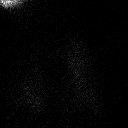
[frame 12/20]
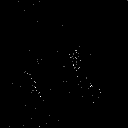
[frame 15/20]
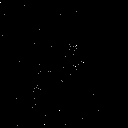
[frame 19/20]
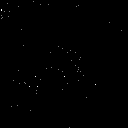

[12 of 12 positions shown; findings below may reference images not displayed]

FINDINGS: Ventilation imaging shows symmetric distribution of xenon
activity throughout both lungs.  Washout phase shows mild xenon
retention in the lung bases bilaterally.

Perfusion images show numerous moderate and large segmental
perfusion defects throughout both lungs, which are un-matched by
ventilatory abnormality.  Majority of these perfusion defects are
new since prior exam.  These findings are consistent with high
probability for pulmonary embolism.
IMPRESSION: High probability for pulmonary embolism.

## 2010-08-08 IMAGING — CR DG CHEST 1V PORT
1 series · 1 of 1 positions shown · non-contrast
Comparison: 03/25/2009

CLINICAL DATA: Pulmonary embolism

PORTABLE CHEST - 1 VIEW

[AP]
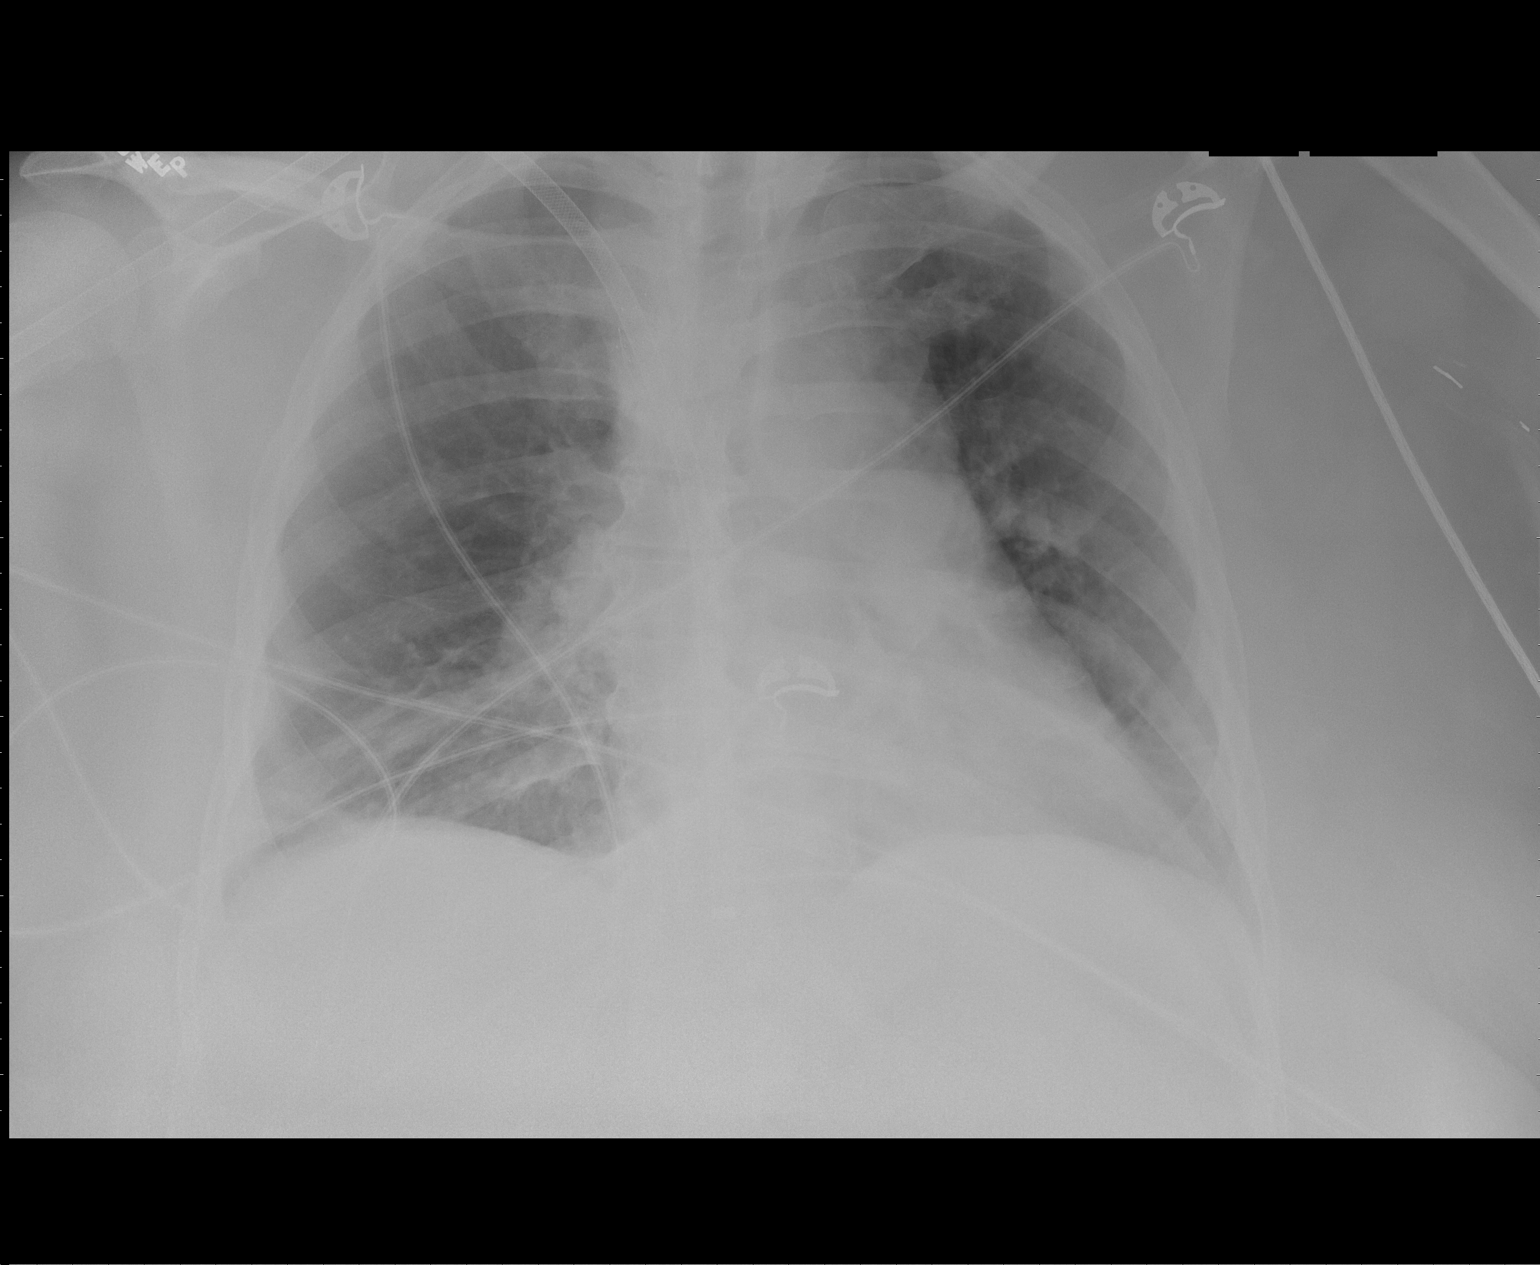

[1 of 1 positions shown; findings below may reference images not displayed]

FINDINGS: Right internal jugular zero catheter is stable.  The
heart is mildly enlarged.  Vascular congestion without pulmonary
edema.  Minimal basilar atelectasis.
IMPRESSION: Minimal basilar atelectasis.  Otherwise stable.

## 2010-08-27 IMAGING — CR DG CHEST 2V
1 series · 1 of 1 positions shown · non-contrast
Comparison: 03/27/2009

CLINICAL DATA: Chest pain

CHEST - 2 VIEW

[view not recorded]
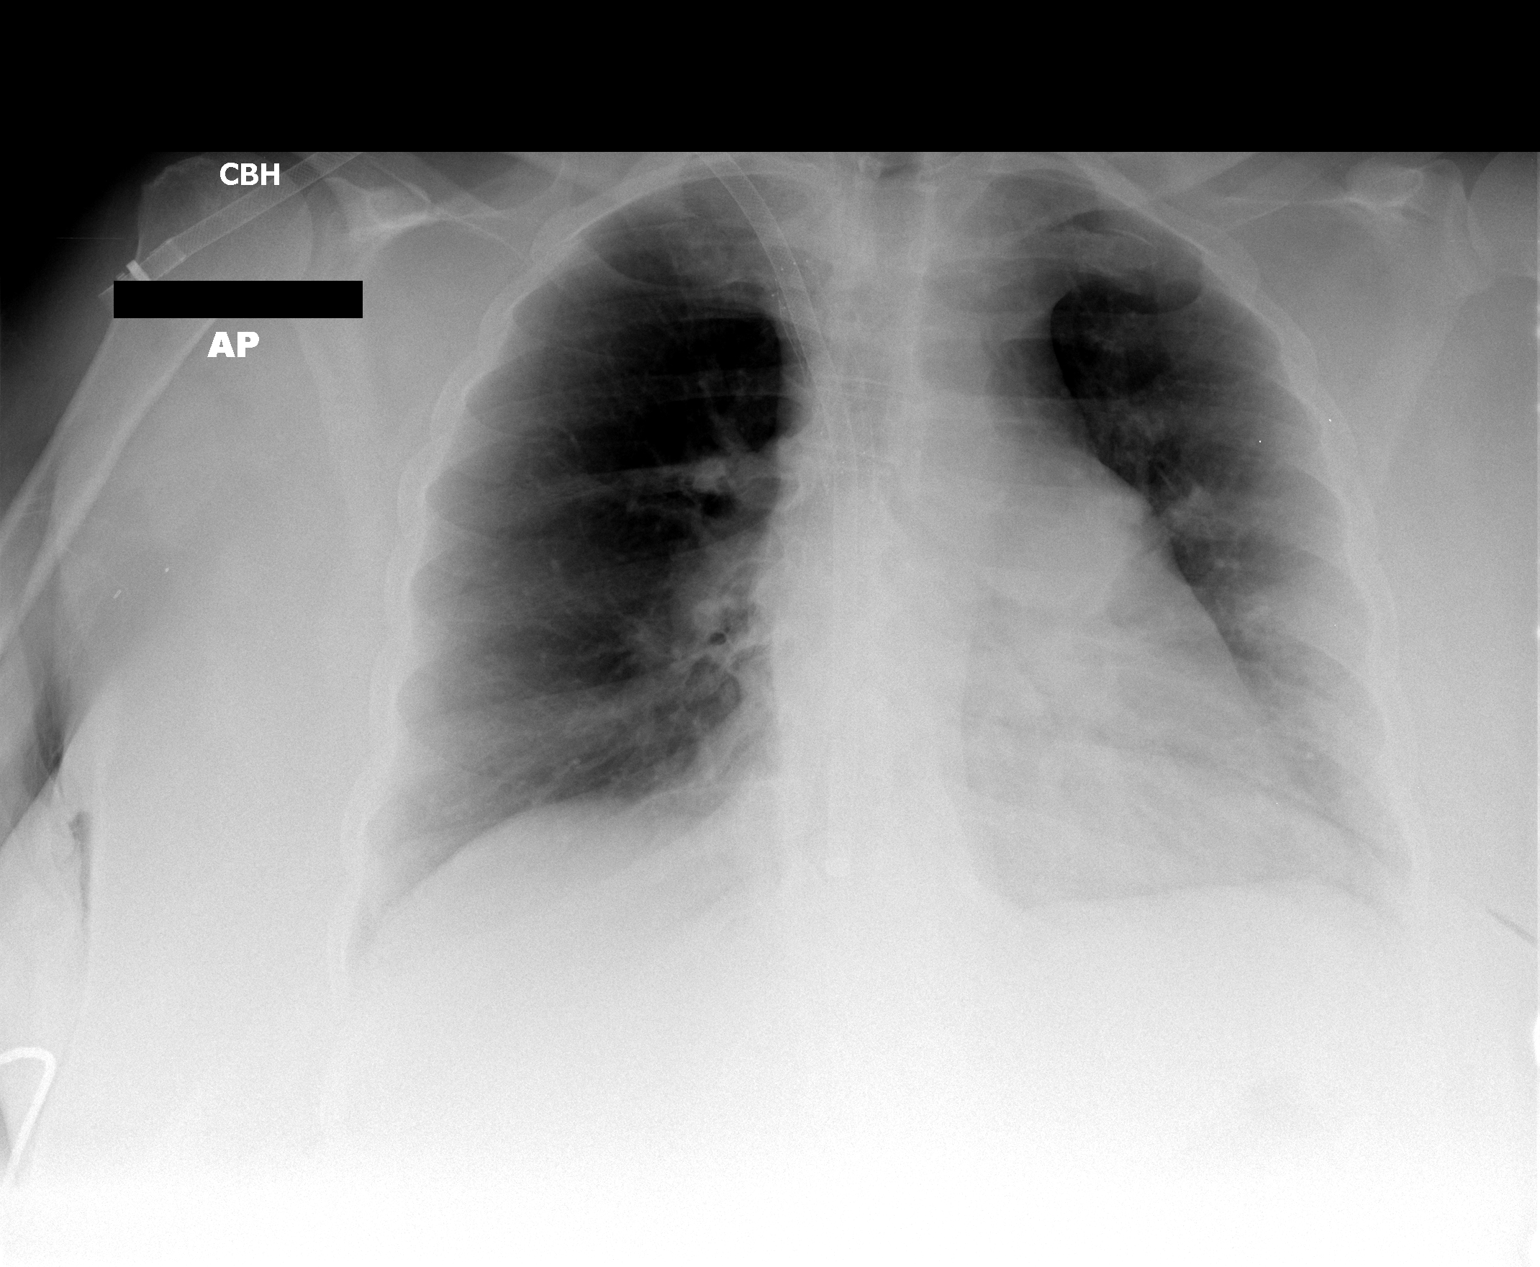

[1 of 1 positions shown; findings below may reference images not displayed]

FINDINGS: The right IJ catheter is unchanged in position.
Cardiomediastinal silhouette is stable.  Stable central vascular
congestion without pulmonary edema. No acute infiltrate.  No
pneumothorax.
IMPRESSION: No active disease.  No significant change.

## 2010-08-28 ENCOUNTER — Ambulatory Visit: Payer: Self-pay | Admitting: Surgery

## 2010-08-28 IMAGING — CR DG CHEST 2V
1 series · 1 of 1 positions shown · non-contrast
Comparison: 04/15/2009

CLINICAL DATA: Chest pain and shortness of breath.

CHEST - 2 VIEW

[view not recorded]
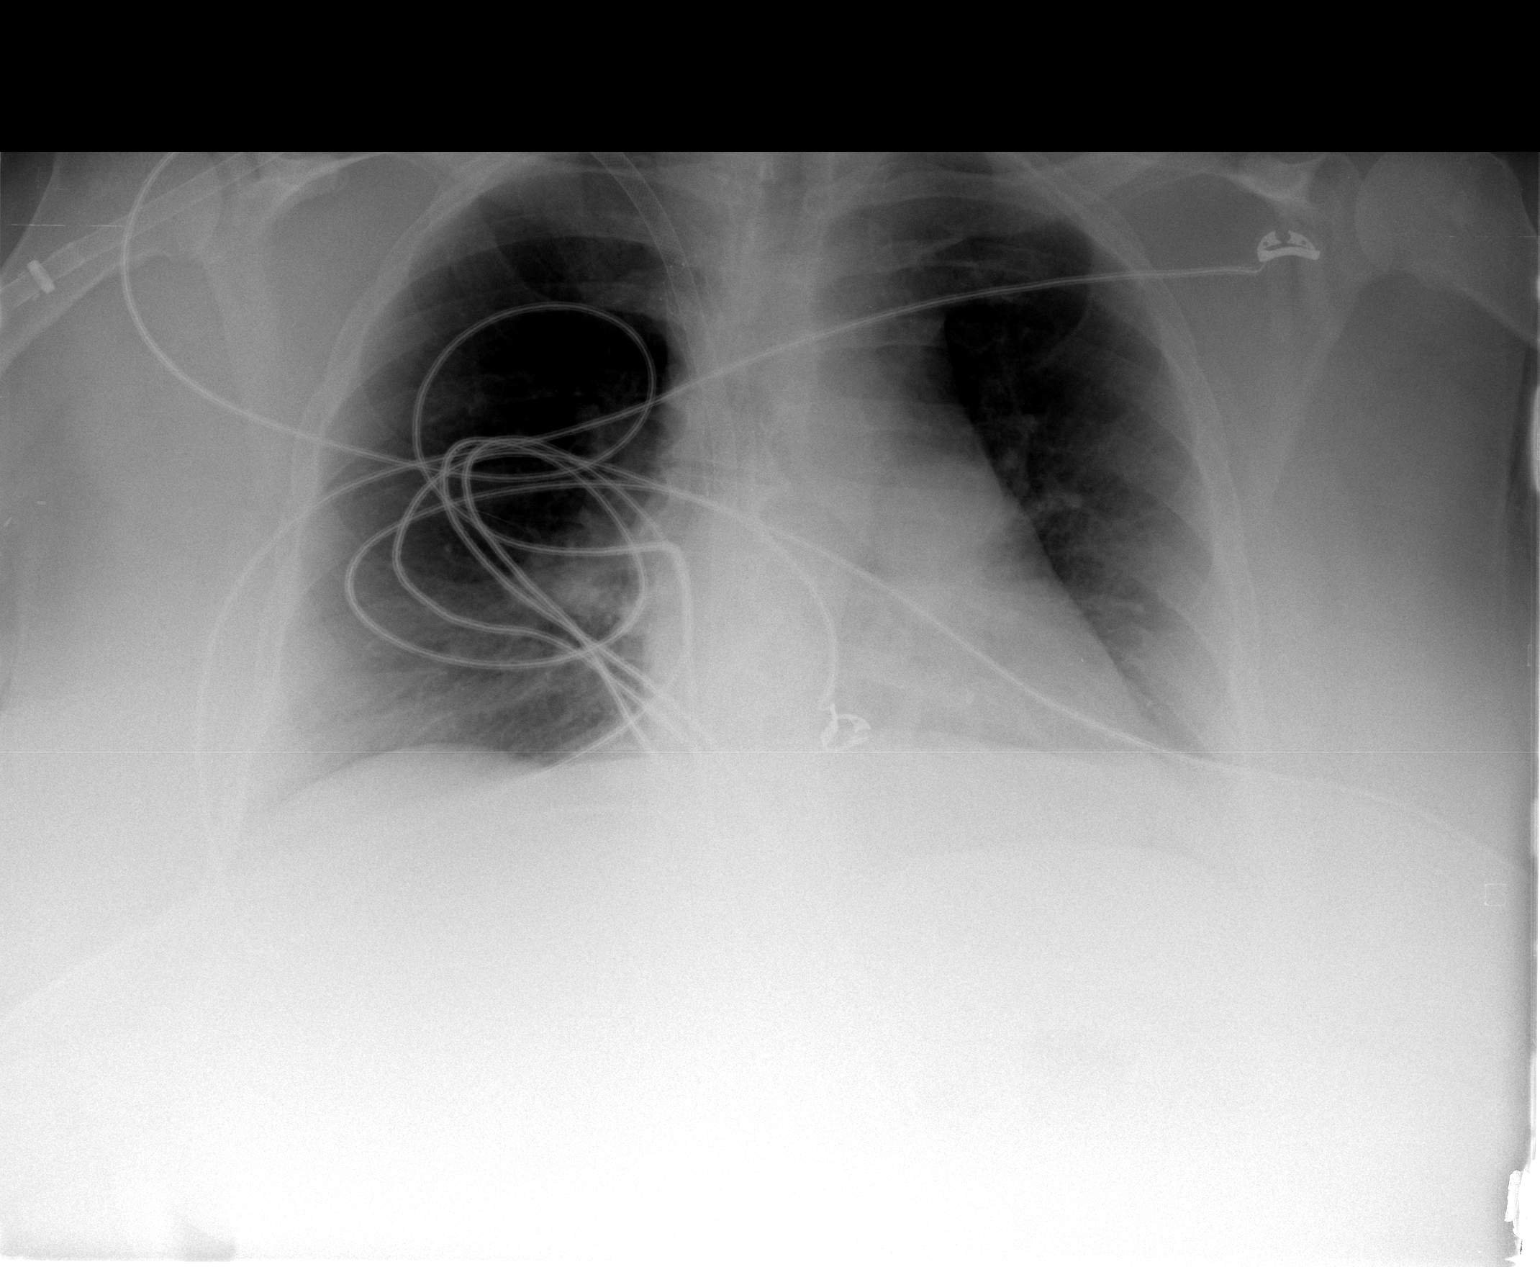

[1 of 1 positions shown; findings below may reference images not displayed]

FINDINGS: Stable right-sided catheter and stent.  Low lung volumes
with vascular crowding and bibasilar atelectasis.  No edema or
effusions.
IMPRESSION: Stable chest x-ray except for slightly lower lung volumes with
vascular crowding and bibasilar atelectasis.

## 2010-08-29 IMAGING — NM NM PULM PERFUSION & VENT (REBREATHING & WASHOUT)
2 series · 12 of 12 positions shown · non-contrast
Comparison: Chest x-ray of 04/16/2009 and nuclear medicine
ventilation perfusion lung scan of 03/26/2009

CLINICAL DATA: Short of breath, hypertension, end-stage renal
disease

NUCLEAR MEDICINE VENTILATION AND PERFUSION SCAN
TECHNIQUE: Wash-in, equilibrium, and wash-out phase ventilation
images were obtained using [id number] gas.  Perfusion images were
obtained in multiple projections after intravenous injection of Tc-
99m MAA.
Radiopharmaceutical: 10 mCi of xenon-488, 6 mCi of technetium 99m
MAA.

[vq scan · 3.20mm/px · 6 of 20 frames shown (1 of 2)]
[frame 2/20]
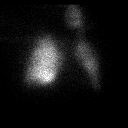
[frame 5/20]
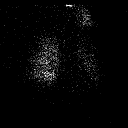
[frame 9/20]
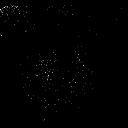
[frame 12/20]
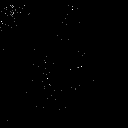
[frame 15/20]
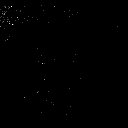
[frame 19/20]
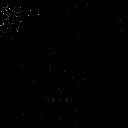

[vq scan · 3.20mm/px · 6 of 20 frames shown (2 of 2)]
[frame 2/20  full-range]
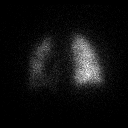
[frame 5/20  full-range]
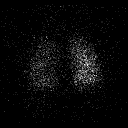
[frame 9/20]
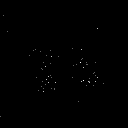
[frame 12/20]
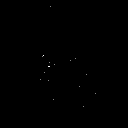
[frame 15/20]
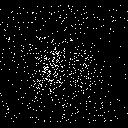
[frame 19/20]
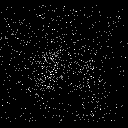

[12 of 12 positions shown; findings below may reference images not displayed]

FINDINGS: On the perfusion phase, there has been a change compared
to the prior ventilation perfusion lung scan. Perfusion of the left
upper lung field has improved somewhat.  However, there are new
perfusion defects with diminished perfusion of much of the right
upper lobe, and a new defect in the anterior basal left lower lobe.
On ventilation there is normal wash-in of the radionuclide with
only minimal gas trapping at the bases.

Comparing to chest x-ray of admission day, no abnormality is
detected.  Therefore these new perfusion defects are consistent
with high probability of pulmonary emboli.
IMPRESSION: High probability of pulmonary emboli with new areas of perfusion
deficit when compared to the  ventilation perfusion lung scan of
03/26/2009, with no corresponding abnormality on chest x-ray.

## 2010-08-30 ENCOUNTER — Ambulatory Visit: Payer: Self-pay | Admitting: Surgery

## 2010-08-30 ENCOUNTER — Ambulatory Visit (HOSPITAL_COMMUNITY): Admission: RE | Admit: 2010-08-30 | Discharge: 2010-08-30 | Payer: Self-pay | Admitting: Surgery

## 2010-09-03 IMAGING — CR DG CHEST 1V PORT
1 series · 1 of 1 positions shown · non-contrast
Comparison: 04/16/2009 and earlier.

CLINICAL DATA: 47-year-old female status post catheter removal from
the right side.

PORTABLE CHEST - 1 VIEW

[view not recorded]
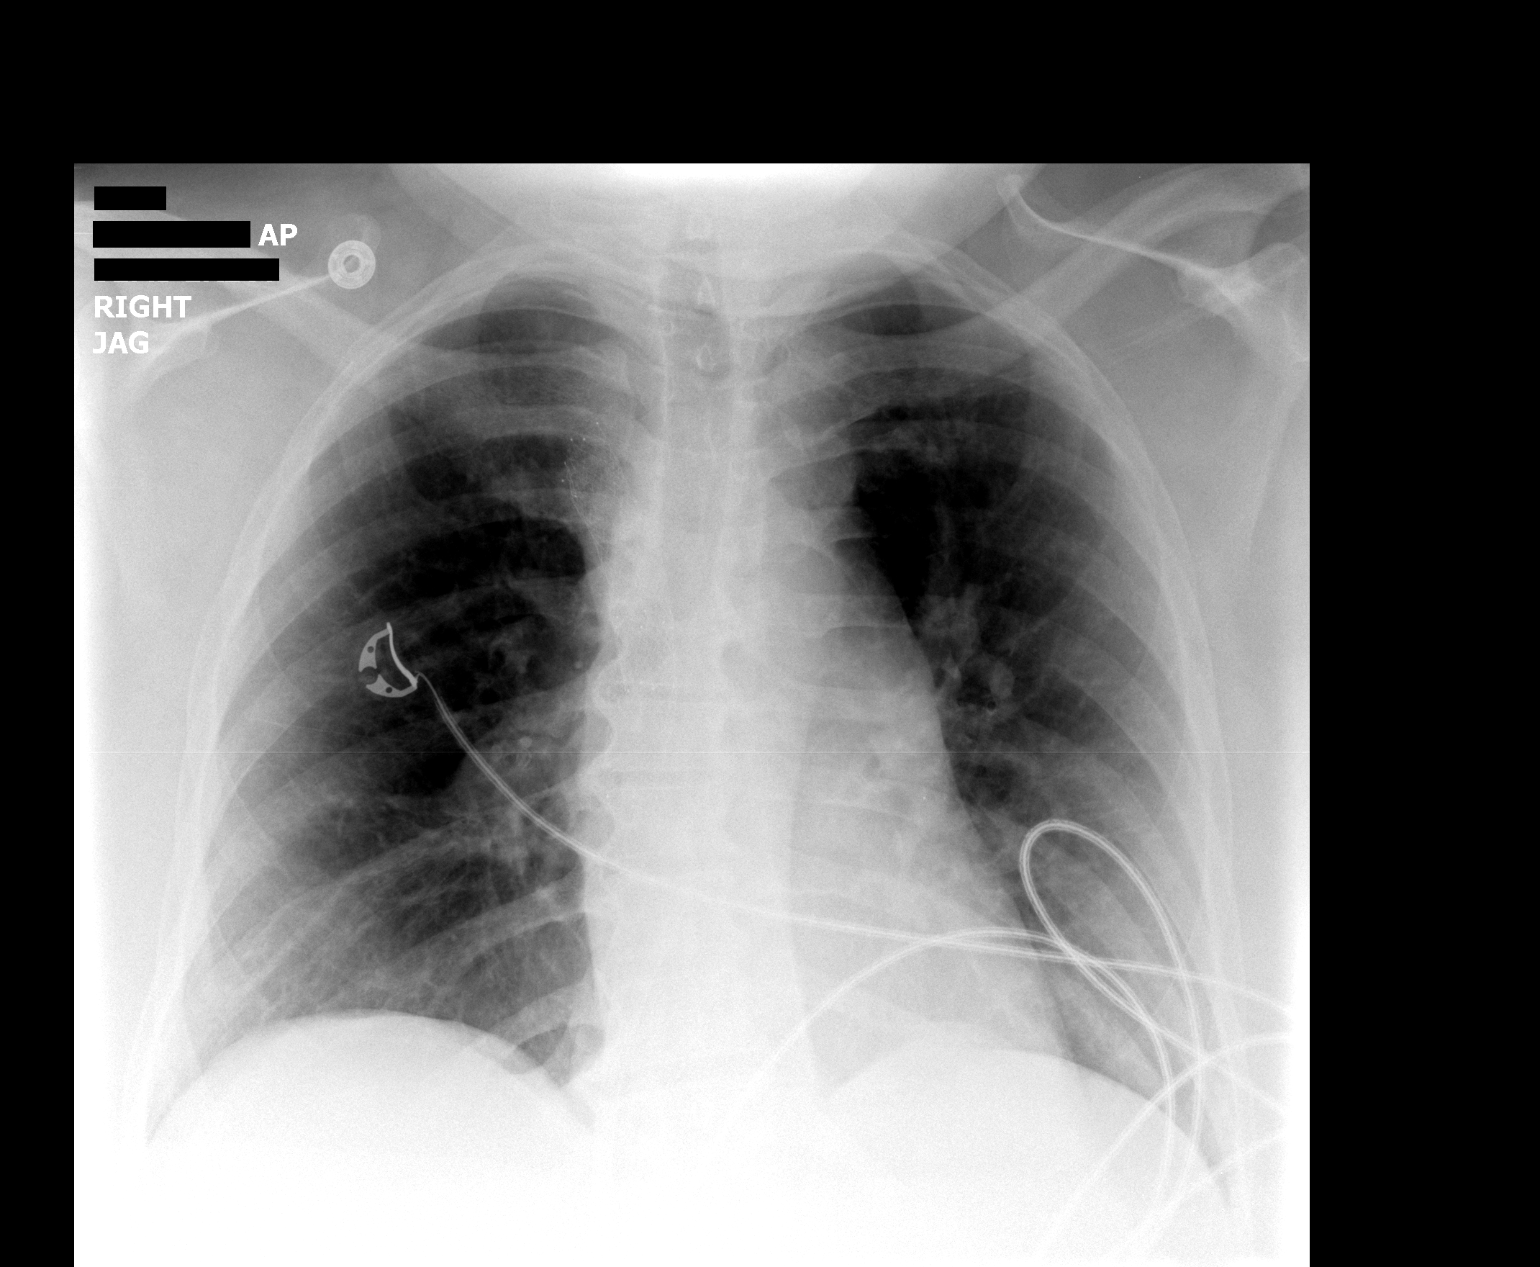

[1 of 1 positions shown; findings below may reference images not displayed]

FINDINGS: Portable AP semi upright view 6601 hours.  Superior vena
cava metallic venous stent re-identified.  Interval removal of
right internal jugular approach venous catheter.  No pneumothorax,
pulmonary edema, pleural effusion, or confluent airspace opacity.
Stable cardiac size and mediastinal contours.
IMPRESSION: Right IJ approach venous catheter removed, otherwise stable chest.

## 2010-09-07 IMAGING — CR DG CHEST 1V PORT
1 series · 1 of 1 positions shown · non-contrast
Comparison: Portable chest x-ray of 04/22/2009.

CLINICAL DATA: Short of breath

PORTABLE CHEST - 1 VIEW

[view not recorded]
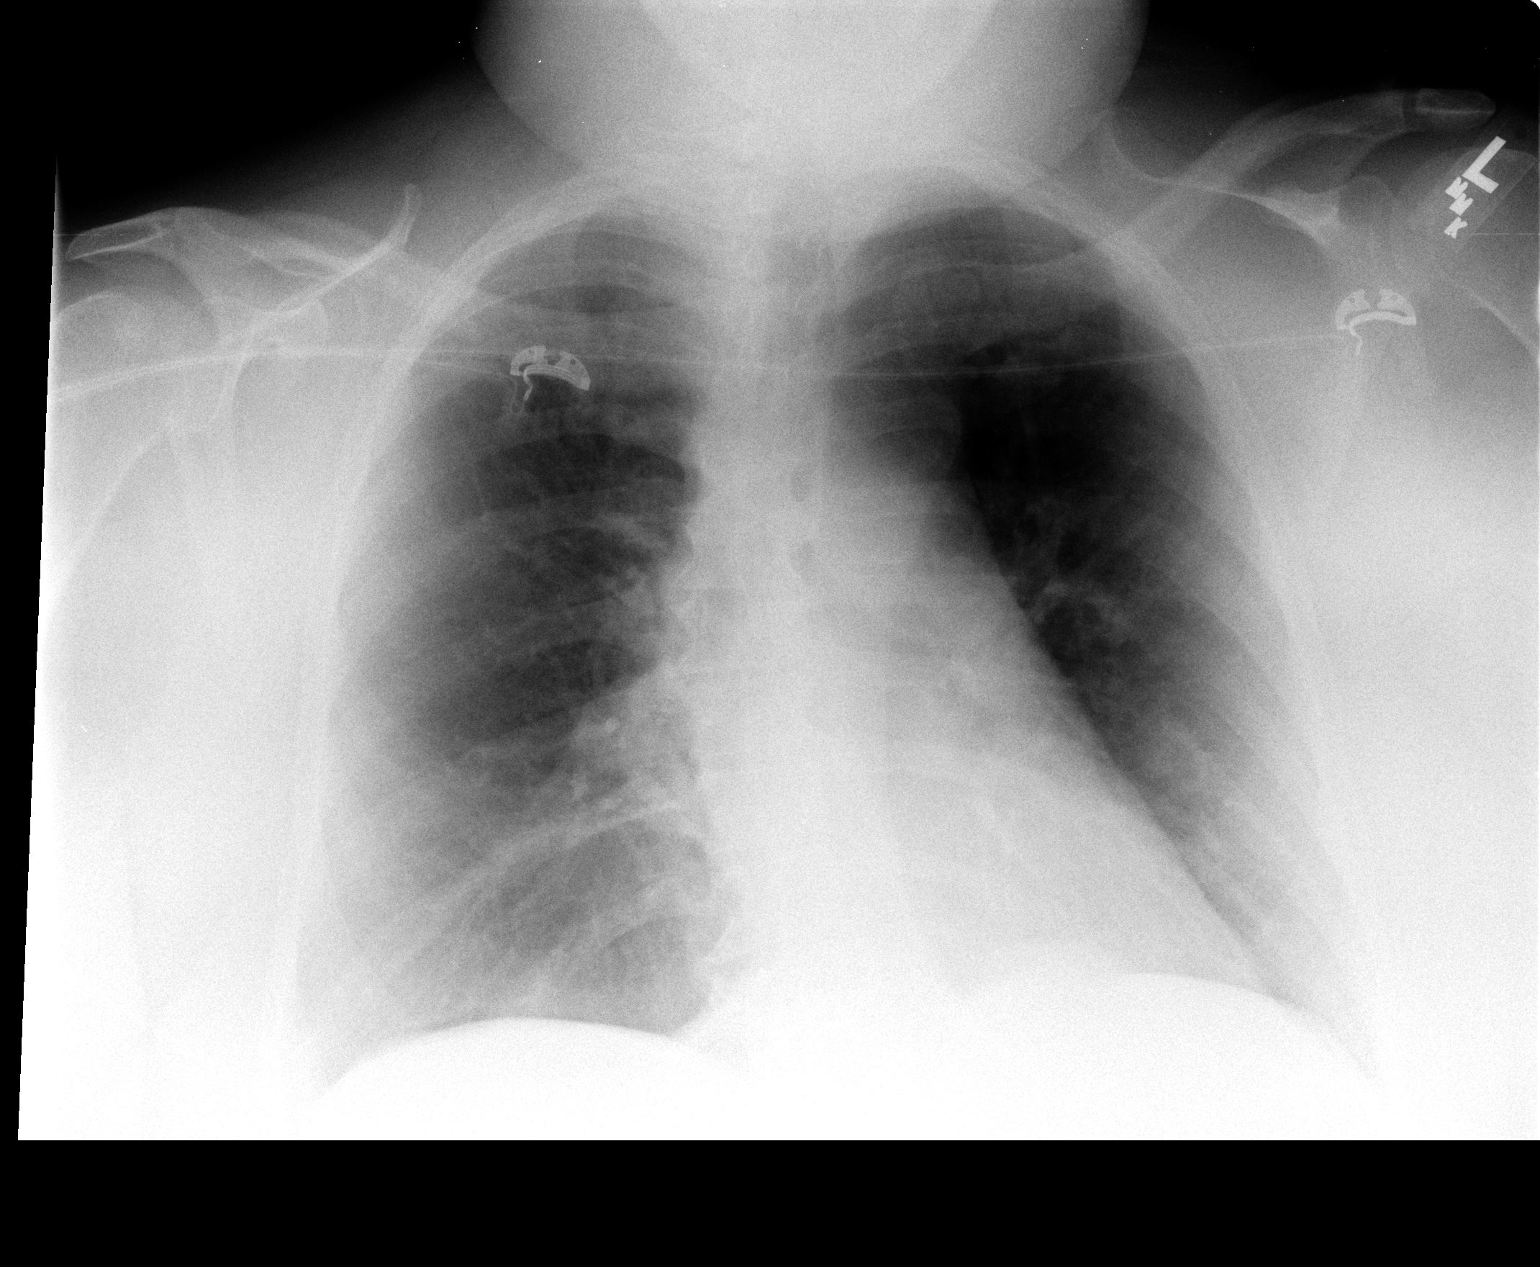

[1 of 1 positions shown; findings below may reference images not displayed]

FINDINGS: The lungs are clear.  Heart size is stable.  Mild
thoracic scoliosis is noted with diffuse degenerative change
IMPRESSION: No active lung disease.

## 2010-09-07 IMAGING — CR DG ABD PORTABLE 1V
1 series · 1 of 1 positions shown · non-contrast
Comparison: 01/07/2009

CLINICAL DATA: Femoral catheter placement.

ABDOMEN - 1 VIEW

[view not recorded]
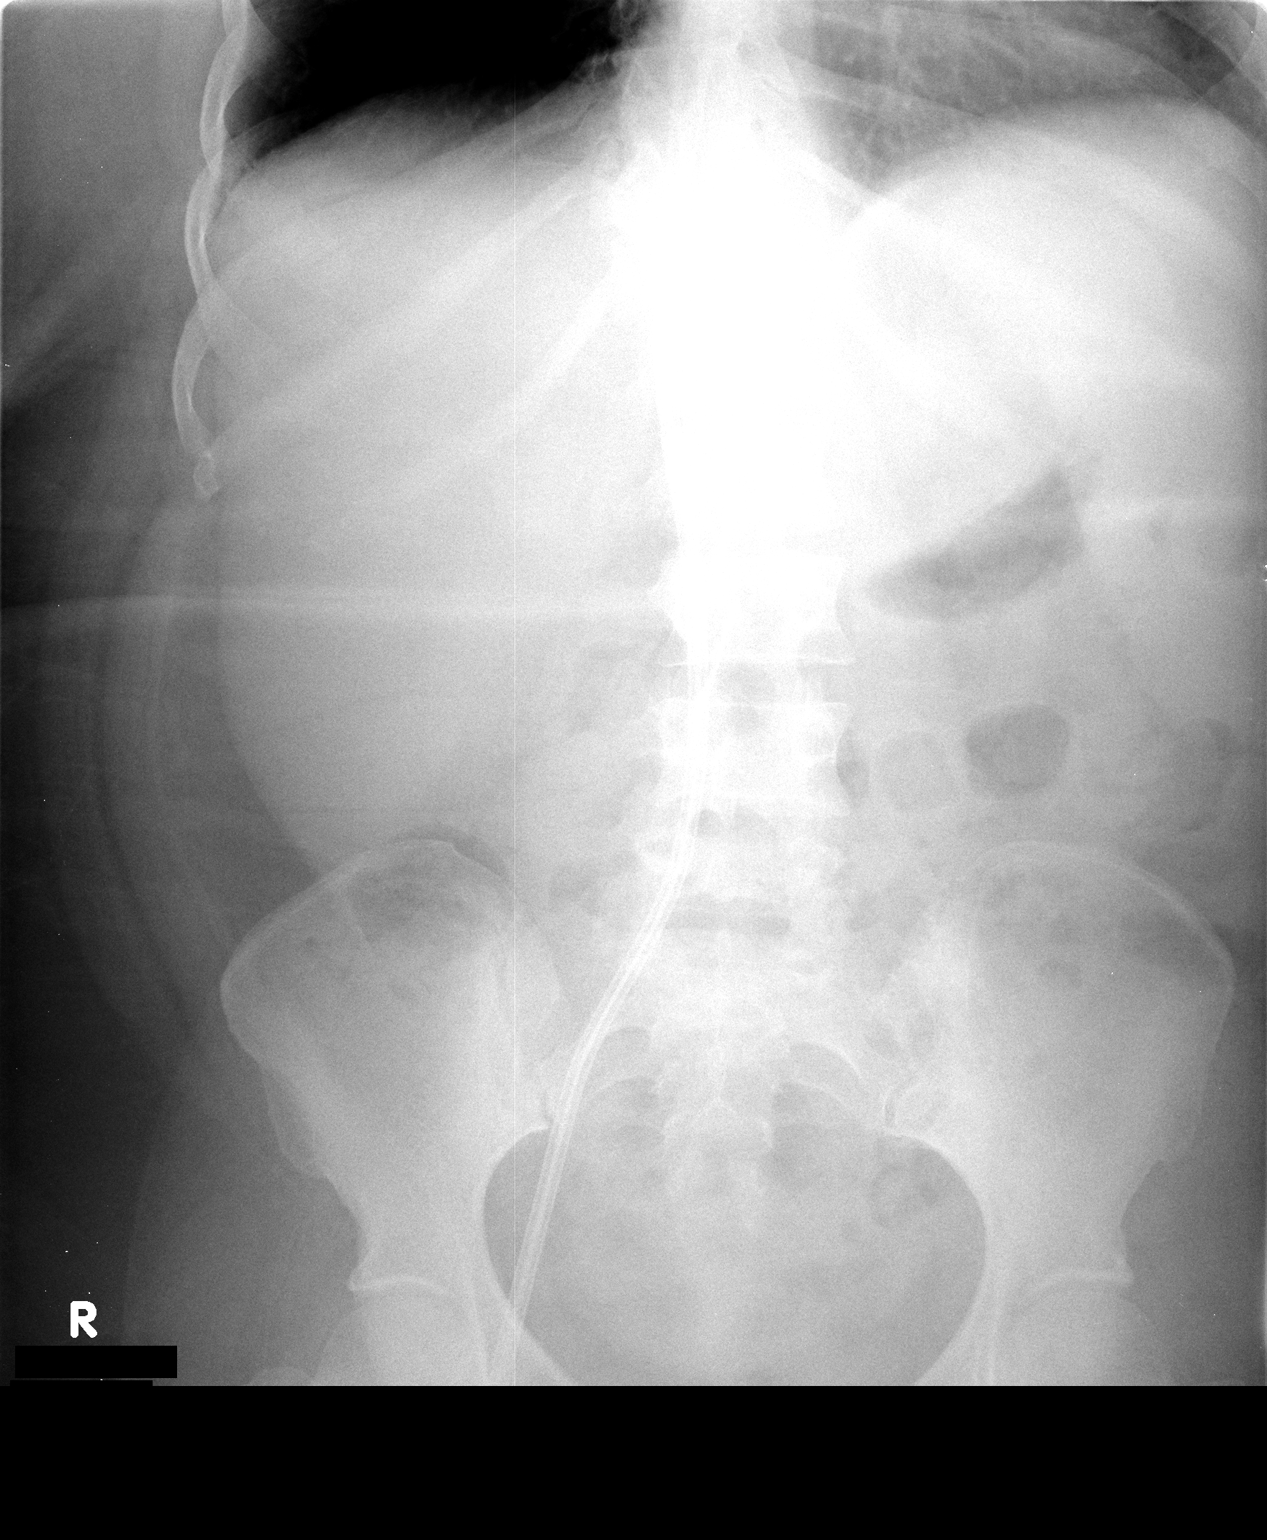

[1 of 1 positions shown; findings below may reference images not displayed]

FINDINGS: There is a right double lumen femoral catheter with the
tip in the right atrium.
IMPRESSION: Right femoral catheter courses up the IVC and into the right
atrium.

## 2010-09-18 ENCOUNTER — Ambulatory Visit: Payer: Self-pay | Admitting: Surgery

## 2010-10-13 ENCOUNTER — Inpatient Hospital Stay (HOSPITAL_COMMUNITY): Admission: RE | Admit: 2010-10-13 | Discharge: 2010-10-14 | Payer: Self-pay | Admitting: Surgery

## 2010-10-13 ENCOUNTER — Ambulatory Visit: Payer: Self-pay | Admitting: Surgery

## 2010-10-20 IMAGING — CR DG CHEST 1V PORT
1 series · 1 of 1 positions shown · non-contrast
Comparison: 04/26/2009

CLINICAL DATA: End-stage renal disease.  Postop for right leg
dialysis catheter.

PORTABLE CHEST - 1 VIEW

[AP]
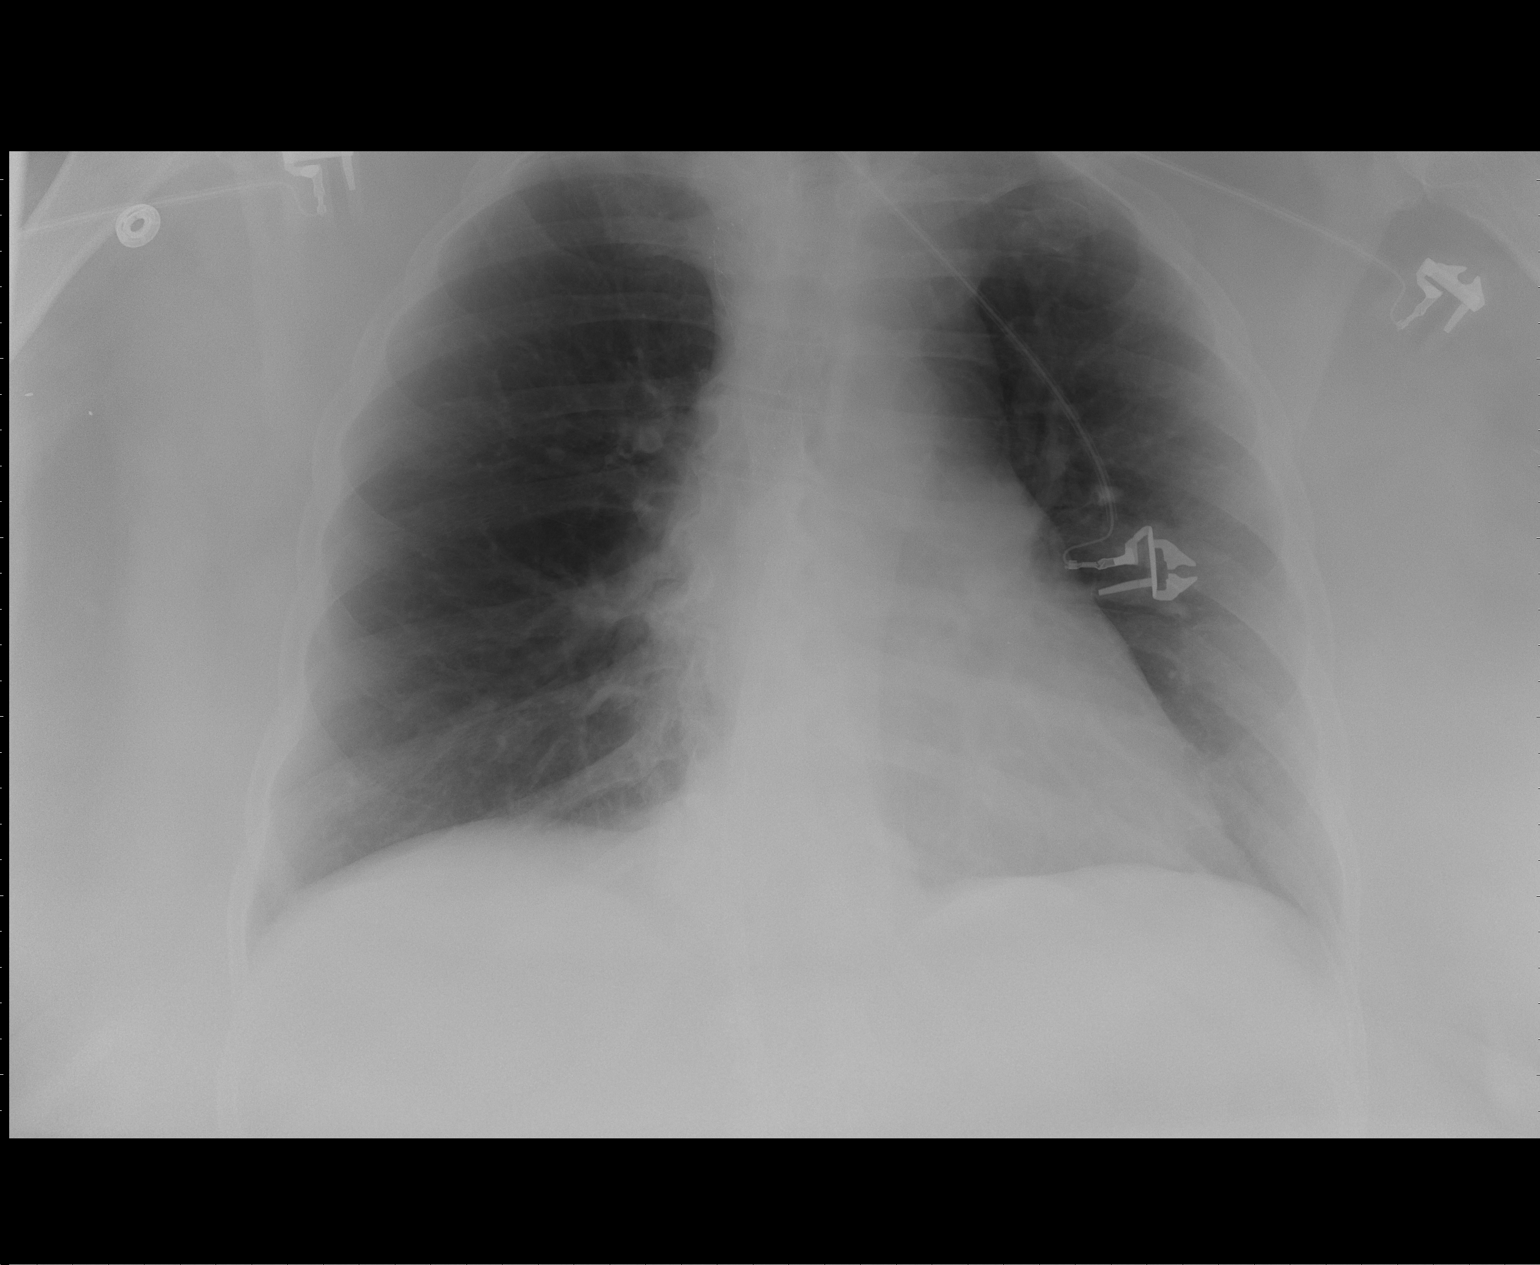

[1 of 1 positions shown; findings below may reference images not displayed]

FINDINGS: Vascular stent is likely within the SVC.  Minimal convex
right thoracic spine curvature. Midline trachea. Normal heart size
for level of inspiration.  No pleural effusion or pneumothorax. No
congestive failure.  A large bore catheter from an  inferior
approach terminates at or just above the superior caval/atrial
junction.  Low SVC position cannot be entirely excluded.
IMPRESSION: 1.  Large bore catheter from an inferior approach terminates either
at or just above the superior caval/atrial junction.  If mid right
atrial positioning is desired, consider retraction approximately 3
cm.
2. No acute cardiopulmonary disease.

## 2010-10-20 IMAGING — CR DG ABD PORTABLE 1V
1 series · 1 of 1 positions shown · non-contrast
Comparison: CXR today.

CLINICAL DATA: ESRD.  Post femoral palidrome catheter insertion.

ABDOMEN - 1 VIEW

[view not recorded]
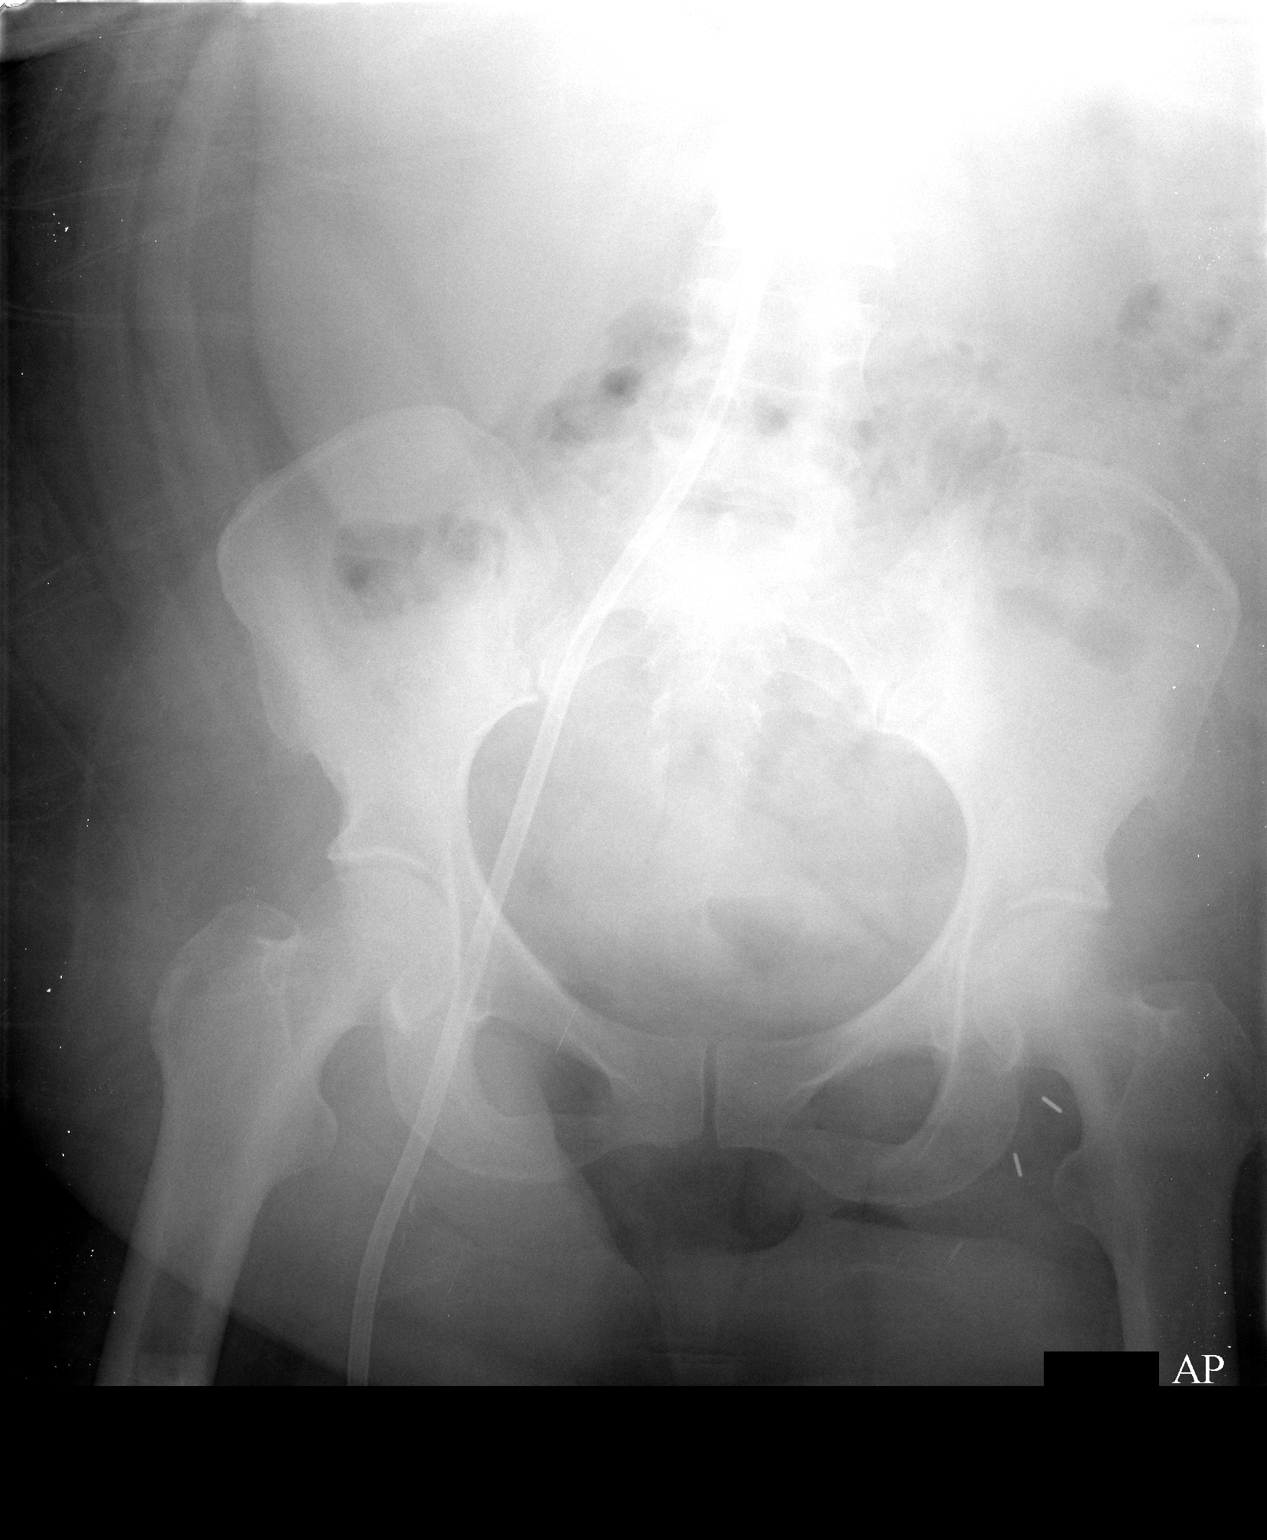

[1 of 1 positions shown; findings below may reference images not displayed]

FINDINGS: Central venous catheter enters via right common femoral
vein approach and can be followed to the L1-2 level.  Chest
radiograph today revealed the distal aspect of the catheter.  Bowel
gas pattern is unremarkable.
IMPRESSION: Right femoral central venous catheter is in place as described
above

## 2010-11-06 ENCOUNTER — Ambulatory Visit: Payer: Self-pay | Admitting: Surgery

## 2010-11-11 IMAGING — US IR FLUORO GUIDE CV LINE*L*
1 series · 2 of 2 positions shown · non-contrast
Comparison: none

INDICATION: 47-year female with end-stage renal disease.  Recently
removed right groin catheter for infection.  The patient has
limited vascular access.

[Series 1: ir fluoro guide cv line*left* · 2 of 2 slices shown]
[im 1/2]
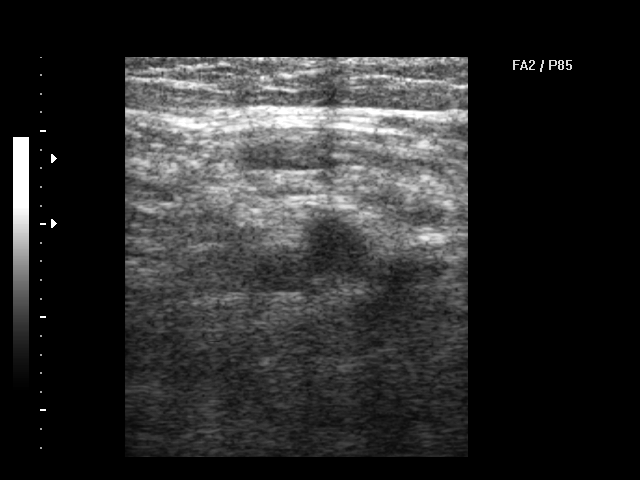
[im 2/2]
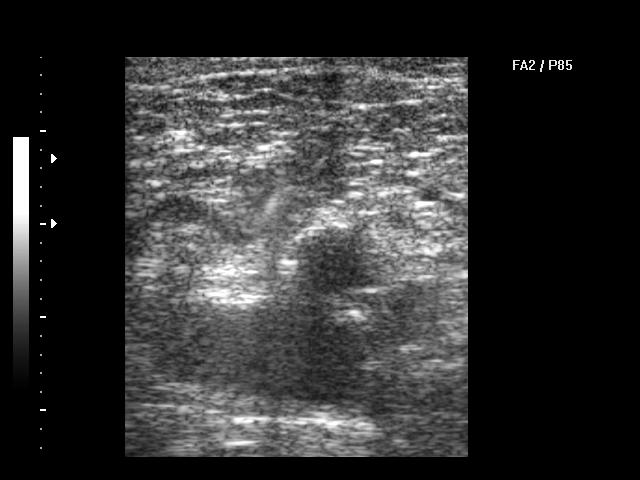

[2 of 2 positions shown; findings below may reference images not displayed]

PROCEDURE(S): FLUOROSCOPIC AND ULTRASOUND GUIDED PLACEMENT OF A
TUNNELED DIAYSIS CATHETER

Medications: Vancomycin 1 gm was given in an appropriate time frame
for this procedure.  Versed 2.5 mg, Fentanyl 125 mcg

Sedation time: 35 minutes

Fluoroscopy time: 4.4minutes

Procedure:Informed consent was obtained for placement of a tunneled
dialysis catheter.  The patient was placed supine on the
interventional table.  Ultrasound was used to evaluate both groins
and both sides of the neck.  Both internal jugular veins were
occluded.  The common femoral veins were patent by ultrasound.
Small amount of drainage in the right groin dressing.  The left
groin was selected for access.  Ultrasound confirmed a patent left
common femoral vein.  Ultrasound images were obtained for
documentation.  The left groin was prepped and draped in a sterile
fashion.  The left groin was anesthetized with 1% lidocaine.  A
small incision was made with #11 blade scalpel.  A 21 gauge needle
directed into the left common femoral vein with ultrasound
guidance.  A micropuncture dilator set was placed.  A 55 cm tip to
cuff Ash Split catheter was selected.  The skin below the left
groin was anesthetized and a small incision was made with #11 blade
scalpel.  A subcutaneous tunnel was formed to the vein dermatotomy
site.  The catheter was brought through the tunnel.  The vein
dermatotomy site was dilated to accommodate a peel-away sheath.
The catheter was placed through the peel-away sheath and directed
into the central venous structures.  The tip of the catheter was
placed in the right atrium with fluoroscopy.  Fluoroscopic images
were obtained for documentation.  Both lumens were found aspirate
and flush well.  The proper amount of heparin was placed in both
lumens.  The vein dermatotomy site was closed using a single layer
of absorbable suture and Dermabond.  The catheter was secured to
the skin using Prolene suture.
FINDINGS: Catheter tip in the right atrium
IMPRESSION: Successful placement of a left groin tunneled dialysis
catheter using ultrasound and fluoroscopic guidance.

## 2010-11-11 IMAGING — XA IR FLUORO GUIDE CV LINE*L*
1 series · 2 of 2 positions shown · non-contrast
Comparison: none

INDICATION: 47-year female with end-stage renal disease.  Recently
removed right groin catheter for infection.  The patient has
limited vascular access.

[Series 1: run · 2 of 2 slices shown]
[im 1/2]
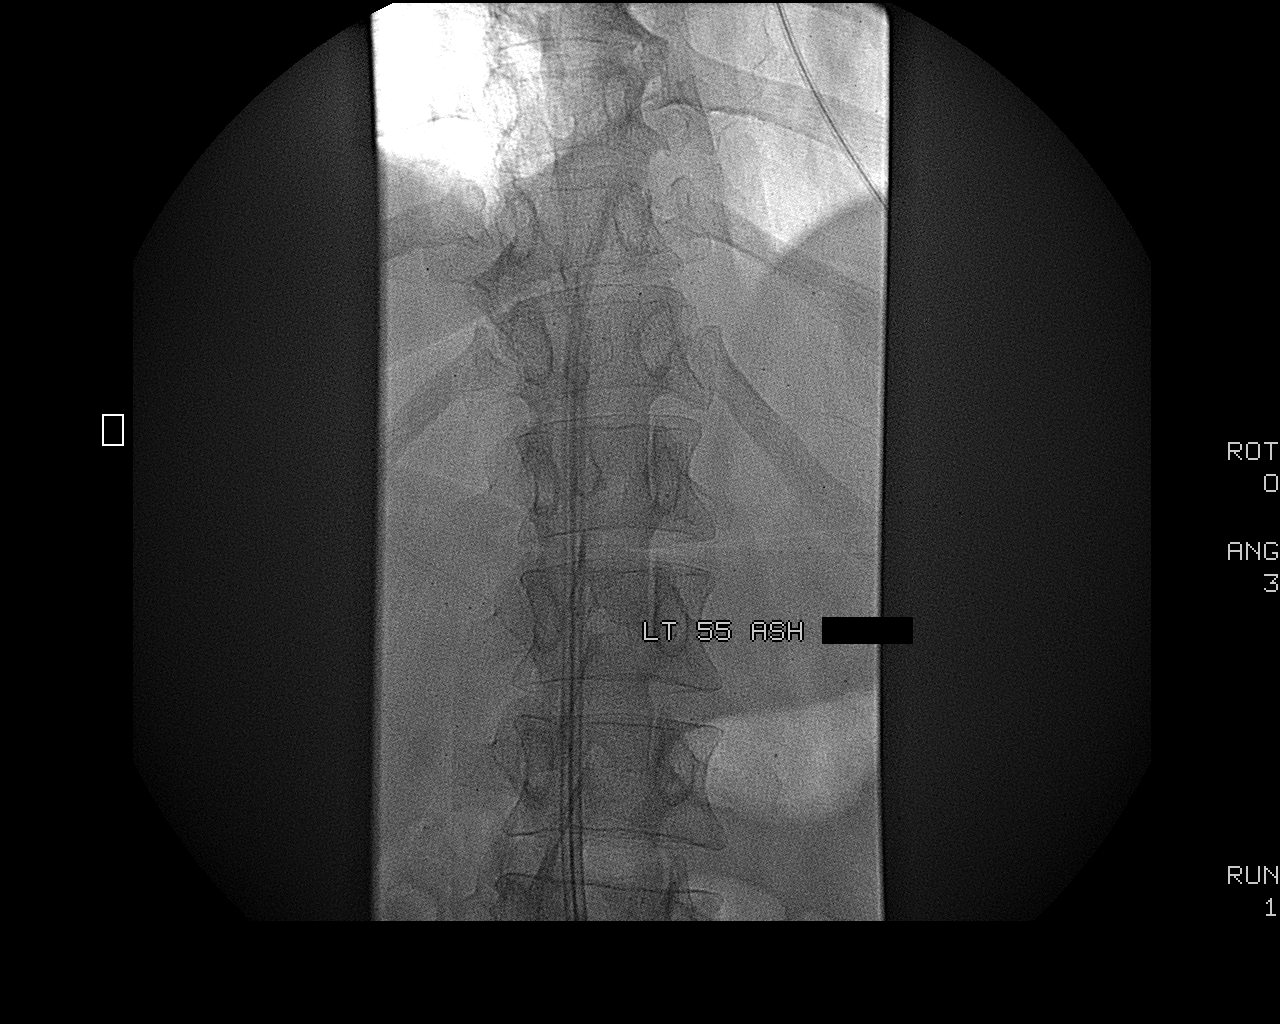
[im 2/2]
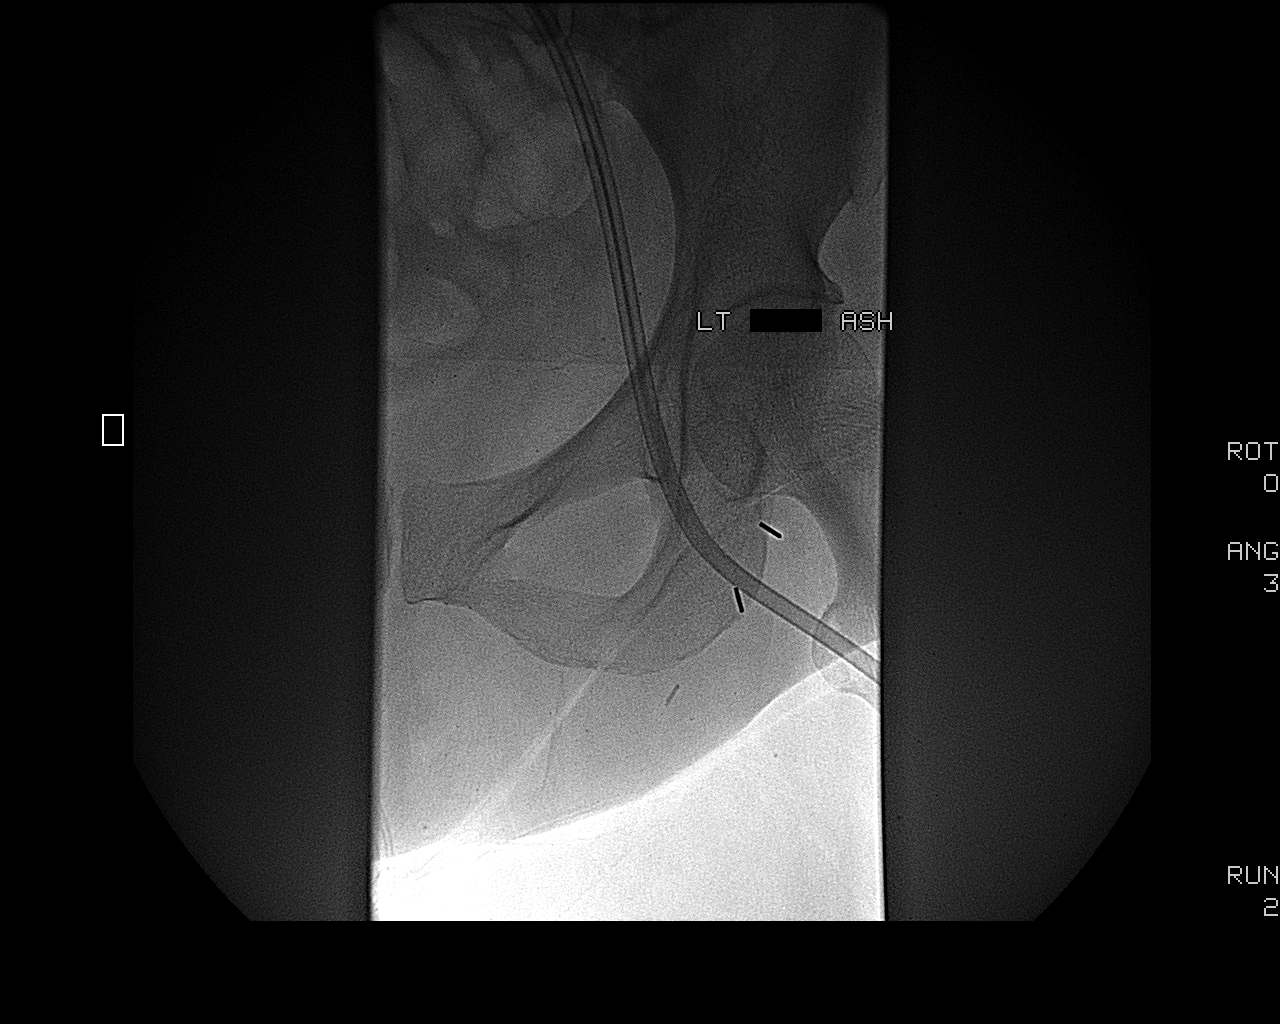

[2 of 2 positions shown; findings below may reference images not displayed]

PROCEDURE(S): FLUOROSCOPIC AND ULTRASOUND GUIDED PLACEMENT OF A
TUNNELED DIAYSIS CATHETER

Medications: Vancomycin 1 gm was given in an appropriate time frame
for this procedure.  Versed 2.5 mg, Fentanyl 125 mcg

Sedation time: 35 minutes

Fluoroscopy time: 4.4minutes

Procedure:Informed consent was obtained for placement of a tunneled
dialysis catheter.  The patient was placed supine on the
interventional table.  Ultrasound was used to evaluate both groins
and both sides of the neck.  Both internal jugular veins were
occluded.  The common femoral veins were patent by ultrasound.
Small amount of drainage in the right groin dressing.  The left
groin was selected for access.  Ultrasound confirmed a patent left
common femoral vein.  Ultrasound images were obtained for
documentation.  The left groin was prepped and draped in a sterile
fashion.  The left groin was anesthetized with 1% lidocaine.  A
small incision was made with #11 blade scalpel.  A 21 gauge needle
directed into the left common femoral vein with ultrasound
guidance.  A micropuncture dilator set was placed.  A 55 cm tip to
cuff Ash Split catheter was selected.  The skin below the left
groin was anesthetized and a small incision was made with #11 blade
scalpel.  A subcutaneous tunnel was formed to the vein dermatotomy
site.  The catheter was brought through the tunnel.  The vein
dermatotomy site was dilated to accommodate a peel-away sheath.
The catheter was placed through the peel-away sheath and directed
into the central venous structures.  The tip of the catheter was
placed in the right atrium with fluoroscopy.  Fluoroscopic images
were obtained for documentation.  Both lumens were found aspirate
and flush well.  The proper amount of heparin was placed in both
lumens.  The vein dermatotomy site was closed using a single layer
of absorbable suture and Dermabond.  The catheter was secured to
the skin using Prolene suture.
FINDINGS: Catheter tip in the right atrium
IMPRESSION: Successful placement of a left groin tunneled dialysis
catheter using ultrasound and fluoroscopic guidance.

## 2010-11-24 ENCOUNTER — Ambulatory Visit: Payer: Self-pay | Admitting: Vascular Surgery

## 2010-12-30 ENCOUNTER — Encounter: Payer: Self-pay | Admitting: Nephrology

## 2010-12-31 ENCOUNTER — Encounter: Payer: Self-pay | Admitting: *Deleted

## 2010-12-31 ENCOUNTER — Encounter: Payer: Self-pay | Admitting: Nephrology

## 2011-01-01 ENCOUNTER — Encounter: Payer: Self-pay | Admitting: Nephrology

## 2011-01-12 IMAGING — CR DG ABD PORTABLE 1V
1 series · 1 of 1 positions shown · non-contrast
Comparison: None

CLINICAL DATA: Dialysis catheter placement

ABDOMEN - 1 VIEW

[view not recorded]
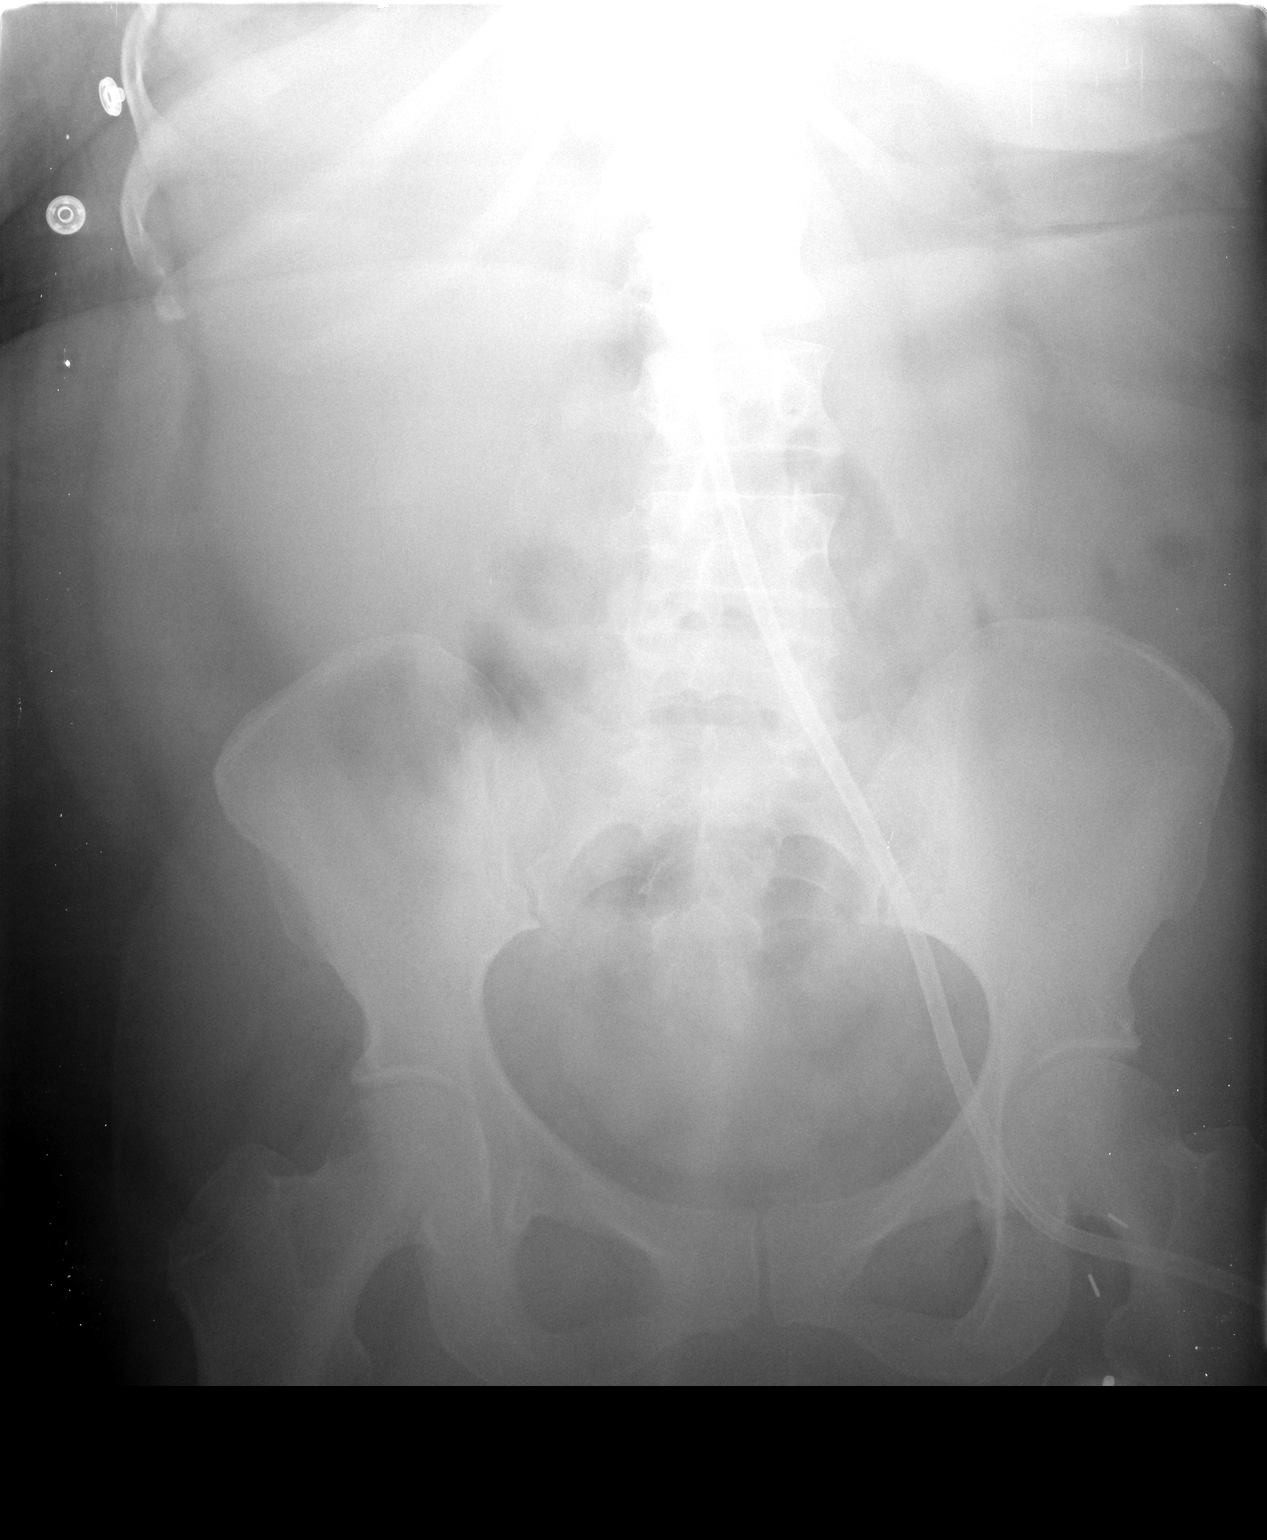

[1 of 1 positions shown; findings below may reference images not displayed]

FINDINGS: Left femoral dialysis catheter extends towards the right
atrium, distal tip not visualized.  Normal bowel gas pattern,
detailed degraded by patient motion.  Vascular clips project over
the left common femoral region.  Visualized bones unremarkable.
IMPRESSION: 1.  Left femoral dialysis catheter placement as above

## 2011-01-12 IMAGING — CR DG CHEST 1V PORT
1 series · 1 of 1 positions shown · non-contrast
Comparison: None

CLINICAL DATA: Pre admit for insertion of hemodialysis catheter

PORTABLE CHEST - 1 VIEW

[view not recorded]
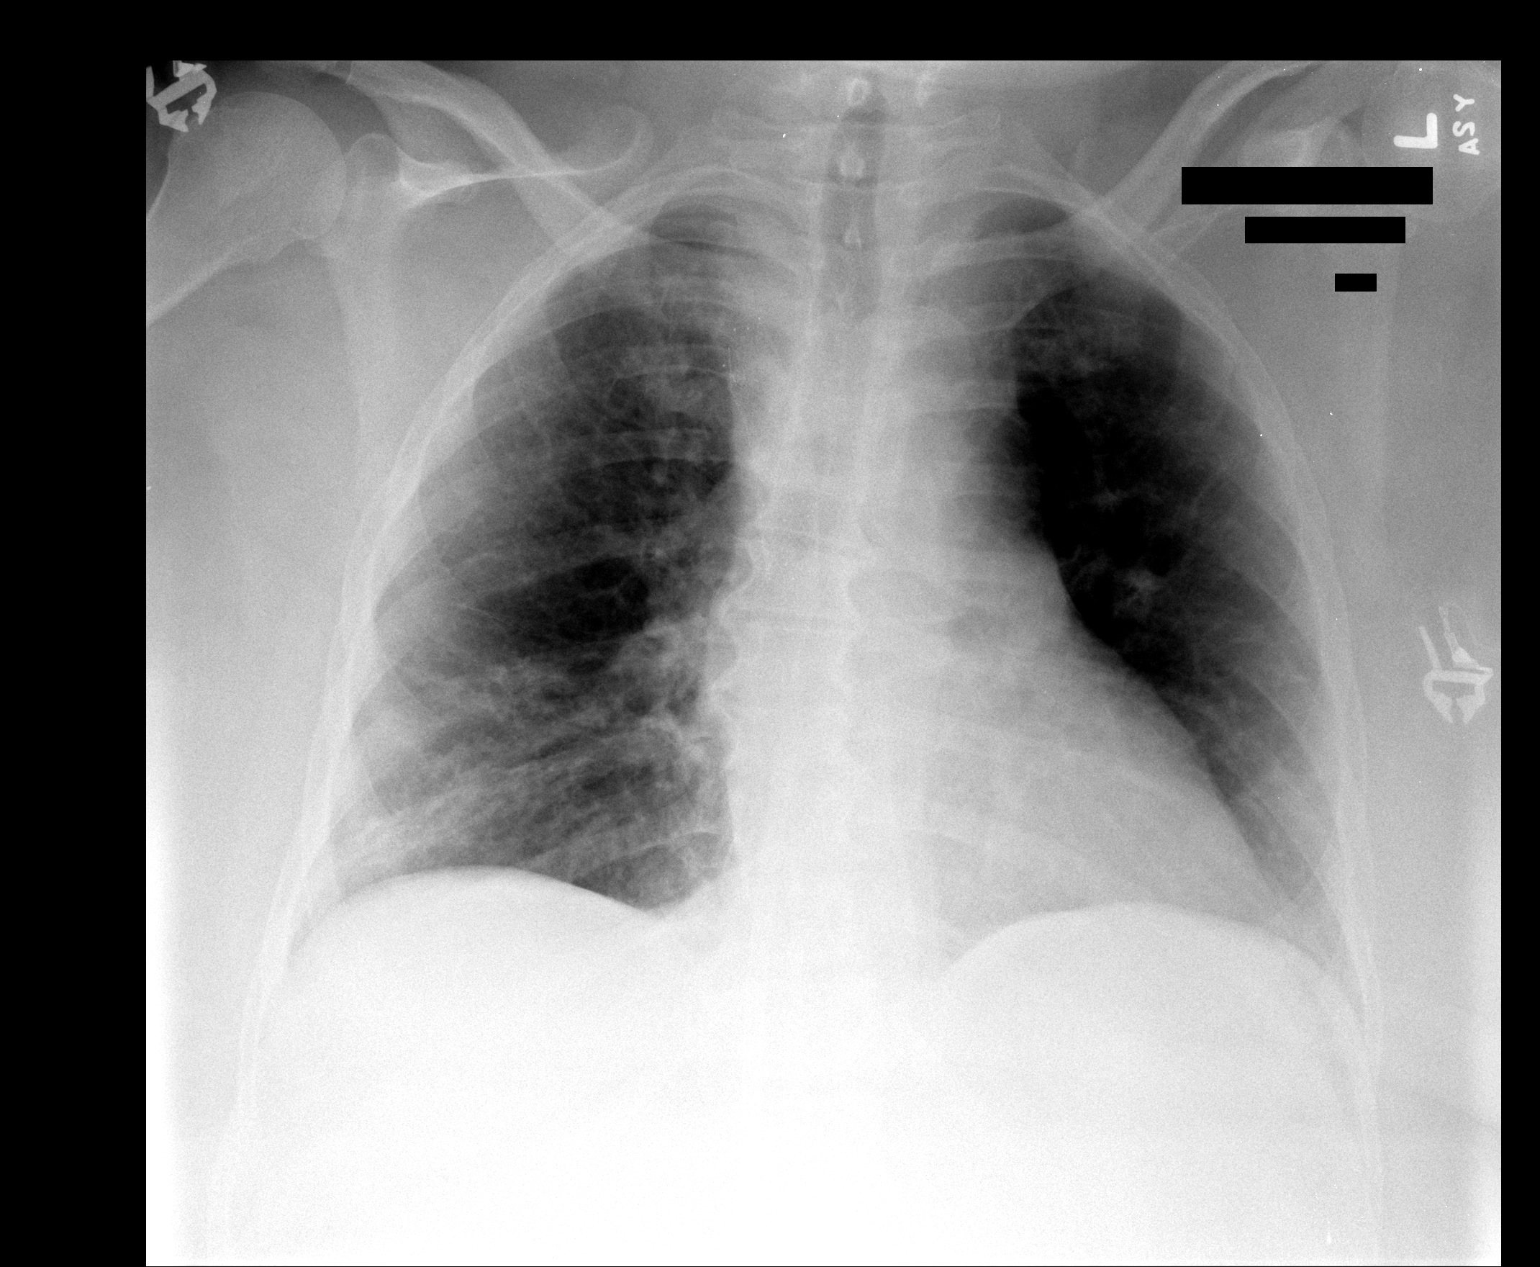

[1 of 1 positions shown; findings below may reference images not displayed]

FINDINGS: Heart and mediastinal contours within normal limits for
AP projection.  There are prominent markings at the right lung
base.  It is possible that this is a chronic finding, but without
prior films, right lower lobe pneumonia cannot be excluded.  No
pleural fluid in one-view.
IMPRESSION: Abnormal density at the right lung base - cannot exclude acute
pneumonia.  See report.

## 2011-01-16 ENCOUNTER — Other Ambulatory Visit (HOSPITAL_COMMUNITY): Payer: Self-pay | Admitting: Nephrology

## 2011-01-16 DIAGNOSIS — N186 End stage renal disease: Secondary | ICD-10-CM

## 2011-01-19 ENCOUNTER — Ambulatory Visit (HOSPITAL_COMMUNITY)
Admission: RE | Admit: 2011-01-19 | Discharge: 2011-01-19 | Disposition: A | Discharge status: Home / Self Care | Payer: Medicare Other | Source: Ambulatory Visit | Attending: Nephrology | Admitting: Nephrology

## 2011-01-19 ENCOUNTER — Inpatient Hospital Stay (HOSPITAL_COMMUNITY): Admission: RE | Admit: 2011-01-19 | Payer: Self-pay | Source: Ambulatory Visit

## 2011-01-19 DIAGNOSIS — N186 End stage renal disease: Secondary | ICD-10-CM | POA: Insufficient documentation

## 2011-01-19 DIAGNOSIS — Z452 Encounter for adjustment and management of vascular access device: Secondary | ICD-10-CM | POA: Insufficient documentation

## 2011-01-19 LAB — PROTIME-INR: INR: 1.1 (ref 0.00–1.49)

## 2011-01-19 LAB — APTT: aPTT: 57 seconds — ABNORMAL HIGH (ref 24–37)

## 2011-02-10 ENCOUNTER — Emergency Department (HOSPITAL_COMMUNITY)
Admission: EM | Admit: 2011-02-10 | Discharge: 2011-02-10 | Disposition: A | Discharge status: Home / Self Care | Payer: Medicare Other | Attending: Emergency Medicine | Admitting: Emergency Medicine

## 2011-02-10 DIAGNOSIS — R58 Hemorrhage, not elsewhere classified: Secondary | ICD-10-CM | POA: Insufficient documentation

## 2011-02-10 DIAGNOSIS — N186 End stage renal disease: Secondary | ICD-10-CM | POA: Insufficient documentation

## 2011-02-10 DIAGNOSIS — G4730 Sleep apnea, unspecified: Secondary | ICD-10-CM | POA: Insufficient documentation

## 2011-02-10 DIAGNOSIS — Z86711 Personal history of pulmonary embolism: Secondary | ICD-10-CM | POA: Insufficient documentation

## 2011-02-10 DIAGNOSIS — E119 Type 2 diabetes mellitus without complications: Secondary | ICD-10-CM | POA: Insufficient documentation

## 2011-02-10 DIAGNOSIS — Z794 Long term (current) use of insulin: Secondary | ICD-10-CM | POA: Insufficient documentation

## 2011-02-10 DIAGNOSIS — Z992 Dependence on renal dialysis: Secondary | ICD-10-CM | POA: Insufficient documentation

## 2011-02-10 DIAGNOSIS — M7989 Other specified soft tissue disorders: Secondary | ICD-10-CM | POA: Insufficient documentation

## 2011-02-10 DIAGNOSIS — M79609 Pain in unspecified limb: Secondary | ICD-10-CM | POA: Insufficient documentation

## 2011-02-10 LAB — BASIC METABOLIC PANEL
BUN: 13 mg/dL (ref 6–23)
Calcium: 8.8 mg/dL (ref 8.4–10.5)
Creatinine, Ser: 3.64 mg/dL — ABNORMAL HIGH (ref 0.4–1.2)
GFR calc non Af Amer: 13 mL/min — ABNORMAL LOW (ref >60)
Glucose, Bld: 125 mg/dL — ABNORMAL HIGH (ref 70–99)
Sodium: 135 mEq/L (ref 135–145)

## 2011-02-10 LAB — DIFFERENTIAL
Basophils Absolute: 0 10*3/uL (ref 0.0–0.1)
Eosinophils Absolute: 0.3 10*3/uL (ref 0.0–0.7)
Eosinophils Relative: 5 % (ref 0–5)
Lymphs Abs: 0.9 10*3/uL (ref 0.7–4.0)
Monocytes Absolute: 0.5 10*3/uL (ref 0.1–1.0)

## 2011-02-10 LAB — CBC
MCHC: 31.5 g/dL (ref 30.0–36.0)
MCV: 87.7 fL (ref 78.0–100.0)
Platelets: 291 10*3/uL (ref 150–400)
RDW: 15.4 % (ref 11.5–15.5)
WBC: 6.5 10*3/uL (ref 4.0–10.5)

## 2011-02-20 LAB — CBC
HCT: 36.2 % (ref 36.0–46.0)
MCH: 27 pg (ref 26.0–34.0)
MCV: 88.1 fL (ref 78.0–100.0)
Platelets: 225 10*3/uL (ref 150–400)
RDW: 14.6 % (ref 11.5–15.5)

## 2011-02-20 LAB — POCT I-STAT, CHEM 8
BUN: 12 mg/dL (ref 6–23)
Calcium, Ion: 1.06 mmol/L — ABNORMAL LOW (ref 1.12–1.32)
Chloride: 99 mEq/L (ref 96–112)
Creatinine, Ser: 5.4 mg/dL — ABNORMAL HIGH (ref 0.4–1.2)
Glucose, Bld: 111 mg/dL — ABNORMAL HIGH (ref 70–99)
TCO2: 31 mmol/L (ref 0–100)

## 2011-02-20 LAB — GLUCOSE, CAPILLARY
Glucose-Capillary: 179 mg/dL — ABNORMAL HIGH (ref 70–99)
Glucose-Capillary: 184 mg/dL — ABNORMAL HIGH (ref 70–99)

## 2011-02-20 LAB — RENAL FUNCTION PANEL
Albumin: 3.9 g/dL (ref 3.5–5.2)
BUN: 22 mg/dL (ref 6–23)
Creatinine, Ser: 7.68 mg/dL — ABNORMAL HIGH (ref 0.4–1.2)
Glucose, Bld: 164 mg/dL — ABNORMAL HIGH (ref 70–99)
Phosphorus: 4.1 mg/dL (ref 2.3–4.6)
Potassium: 5 mEq/L (ref 3.5–5.1)

## 2011-02-20 LAB — PROTIME-INR: Prothrombin Time: 14.6 seconds (ref 11.6–15.2)

## 2011-02-20 LAB — APTT: aPTT: 32 seconds (ref 24–37)

## 2011-02-22 LAB — CBC
HCT: 28.2 % — ABNORMAL LOW (ref 36.0–46.0)
HCT: 28.2 % — ABNORMAL LOW (ref 36.0–46.0)
HCT: 28.5 % — ABNORMAL LOW (ref 36.0–46.0)
HCT: 29.2 % — ABNORMAL LOW (ref 36.0–46.0)
HCT: 29.5 % — ABNORMAL LOW (ref 36.0–46.0)
HCT: 30.9 % — ABNORMAL LOW (ref 36.0–46.0)
Hemoglobin: 8.6 g/dL — ABNORMAL LOW (ref 12.0–15.0)
Hemoglobin: 9.1 g/dL — ABNORMAL LOW (ref 12.0–15.0)
Hemoglobin: 9.3 g/dL — ABNORMAL LOW (ref 12.0–15.0)
Hemoglobin: 9.3 g/dL — ABNORMAL LOW (ref 12.0–15.0)
Hemoglobin: 9.5 g/dL — ABNORMAL LOW (ref 12.0–15.0)
Hemoglobin: 9.6 g/dL — ABNORMAL LOW (ref 12.0–15.0)
MCH: 28.1 pg (ref 26.0–34.0)
MCH: 28.6 pg (ref 26.0–34.0)
MCH: 28.8 pg (ref 26.0–34.0)
MCH: 29.1 pg (ref 26.0–34.0)
MCHC: 31.1 g/dL (ref 30.0–36.0)
MCHC: 31.5 g/dL (ref 30.0–36.0)
MCHC: 31.8 g/dL (ref 30.0–36.0)
MCHC: 31.9 g/dL (ref 30.0–36.0)
MCHC: 31.9 g/dL (ref 30.0–36.0)
MCHC: 31.9 g/dL (ref 30.0–36.0)
MCHC: 32 g/dL (ref 30.0–36.0)
MCHC: 32.5 g/dL (ref 30.0–36.0)
MCHC: 32.7 g/dL (ref 30.0–36.0)
MCV: 87.4 fL (ref 78.0–100.0)
MCV: 88.3 fL (ref 78.0–100.0)
MCV: 88.3 fL (ref 78.0–100.0)
MCV: 88.7 fL (ref 78.0–100.0)
MCV: 91.2 fL (ref 78.0–100.0)
Platelets: 179 10*3/uL (ref 150–400)
Platelets: 186 10*3/uL (ref 150–400)
Platelets: 188 10*3/uL (ref 150–400)
Platelets: 195 10*3/uL (ref 150–400)
Platelets: 197 10*3/uL (ref 150–400)
RBC: 3 MIL/uL — ABNORMAL LOW (ref 3.87–5.11)
RBC: 3.17 MIL/uL — ABNORMAL LOW (ref 3.87–5.11)
RBC: 3.18 MIL/uL — ABNORMAL LOW (ref 3.87–5.11)
RBC: 3.26 MIL/uL — ABNORMAL LOW (ref 3.87–5.11)
RDW: 14.7 % (ref 11.5–15.5)
RDW: 14.8 % (ref 11.5–15.5)
RDW: 14.8 % (ref 11.5–15.5)
RDW: 14.8 % (ref 11.5–15.5)
RDW: 15 % (ref 11.5–15.5)
RDW: 15 % (ref 11.5–15.5)
RDW: 15.2 % (ref 11.5–15.5)
WBC: 10.2 10*3/uL (ref 4.0–10.5)
WBC: 5.8 10*3/uL (ref 4.0–10.5)
WBC: 6 10*3/uL (ref 4.0–10.5)
WBC: 6.1 10*3/uL (ref 4.0–10.5)
WBC: 6.7 10*3/uL (ref 4.0–10.5)
WBC: 7.3 10*3/uL (ref 4.0–10.5)
WBC: 9.9 10*3/uL (ref 4.0–10.5)

## 2011-02-22 LAB — COMPREHENSIVE METABOLIC PANEL
ALT: 14 U/L (ref 0–35)
AST: 15 U/L (ref 0–37)
Albumin: 3.7 g/dL (ref 3.5–5.2)
Calcium: 10.6 mg/dL — ABNORMAL HIGH (ref 8.4–10.5)
Creatinine, Ser: 8.07 mg/dL — ABNORMAL HIGH (ref 0.4–1.2)
GFR calc Af Amer: 6 mL/min — ABNORMAL LOW (ref >60)
GFR calc non Af Amer: 5 mL/min — ABNORMAL LOW (ref >60)
Sodium: 133 mEq/L — ABNORMAL LOW (ref 135–145)
Total Protein: 8.5 g/dL — ABNORMAL HIGH (ref 6.0–8.3)

## 2011-02-22 LAB — GLUCOSE, CAPILLARY
Glucose-Capillary: 104 mg/dL — ABNORMAL HIGH (ref 70–99)
Glucose-Capillary: 107 mg/dL — ABNORMAL HIGH (ref 70–99)
Glucose-Capillary: 108 mg/dL — ABNORMAL HIGH (ref 70–99)
Glucose-Capillary: 115 mg/dL — ABNORMAL HIGH (ref 70–99)
Glucose-Capillary: 115 mg/dL — ABNORMAL HIGH (ref 70–99)
Glucose-Capillary: 116 mg/dL — ABNORMAL HIGH (ref 70–99)
Glucose-Capillary: 117 mg/dL — ABNORMAL HIGH (ref 70–99)
Glucose-Capillary: 117 mg/dL — ABNORMAL HIGH (ref 70–99)
Glucose-Capillary: 117 mg/dL — ABNORMAL HIGH (ref 70–99)
Glucose-Capillary: 121 mg/dL — ABNORMAL HIGH (ref 70–99)
Glucose-Capillary: 142 mg/dL — ABNORMAL HIGH (ref 70–99)
Glucose-Capillary: 143 mg/dL — ABNORMAL HIGH (ref 70–99)
Glucose-Capillary: 143 mg/dL — ABNORMAL HIGH (ref 70–99)
Glucose-Capillary: 156 mg/dL — ABNORMAL HIGH (ref 70–99)
Glucose-Capillary: 164 mg/dL — ABNORMAL HIGH (ref 70–99)
Glucose-Capillary: 203 mg/dL — ABNORMAL HIGH (ref 70–99)
Glucose-Capillary: 205 mg/dL — ABNORMAL HIGH (ref 70–99)
Glucose-Capillary: 211 mg/dL — ABNORMAL HIGH (ref 70–99)
Glucose-Capillary: 256 mg/dL — ABNORMAL HIGH (ref 70–99)
Glucose-Capillary: 319 mg/dL — ABNORMAL HIGH (ref 70–99)
Glucose-Capillary: 355 mg/dL — ABNORMAL HIGH (ref 70–99)
Glucose-Capillary: 91 mg/dL (ref 70–99)

## 2011-02-22 LAB — RENAL FUNCTION PANEL
Albumin: 2.8 g/dL — ABNORMAL LOW (ref 3.5–5.2)
Albumin: 2.9 g/dL — ABNORMAL LOW (ref 3.5–5.2)
BUN: 14 mg/dL (ref 6–23)
BUN: 23 mg/dL (ref 6–23)
BUN: 36 mg/dL — ABNORMAL HIGH (ref 6–23)
CO2: 22 mEq/L (ref 19–32)
CO2: 23 mEq/L (ref 19–32)
Calcium: 6.7 mg/dL — ABNORMAL LOW (ref 8.4–10.5)
Calcium: 7.3 mg/dL — ABNORMAL LOW (ref 8.4–10.5)
Chloride: 103 mEq/L (ref 96–112)
Chloride: 103 mEq/L (ref 96–112)
Chloride: 94 mEq/L — ABNORMAL LOW (ref 96–112)
Chloride: 99 mEq/L (ref 96–112)
Chloride: 99 mEq/L (ref 96–112)
Creatinine, Ser: 10.5 mg/dL — ABNORMAL HIGH (ref 0.4–1.2)
Creatinine, Ser: 4.4 mg/dL — ABNORMAL HIGH (ref 0.4–1.2)
Creatinine, Ser: 6.51 mg/dL — ABNORMAL HIGH (ref 0.4–1.2)
Creatinine, Ser: 8.64 mg/dL — ABNORMAL HIGH (ref 0.4–1.2)
GFR calc Af Amer: 5 mL/min — ABNORMAL LOW (ref >60)
GFR calc Af Amer: 6 mL/min — ABNORMAL LOW (ref >60)
GFR calc Af Amer: 6 mL/min — ABNORMAL LOW (ref >60)
GFR calc non Af Amer: 4 mL/min — ABNORMAL LOW (ref >60)
GFR calc non Af Amer: 5 mL/min — ABNORMAL LOW (ref >60)
GFR calc non Af Amer: 7 mL/min — ABNORMAL LOW (ref >60)
Glucose, Bld: 114 mg/dL — ABNORMAL HIGH (ref 70–99)
Glucose, Bld: 140 mg/dL — ABNORMAL HIGH (ref 70–99)
Glucose, Bld: 372 mg/dL — ABNORMAL HIGH (ref 70–99)
Phosphorus: 1.8 mg/dL — ABNORMAL LOW (ref 2.3–4.6)
Phosphorus: 3.7 mg/dL (ref 2.3–4.6)
Potassium: 3.2 mEq/L — ABNORMAL LOW (ref 3.5–5.1)
Potassium: 3.9 mEq/L (ref 3.5–5.1)
Sodium: 128 mEq/L — ABNORMAL LOW (ref 135–145)

## 2011-02-22 LAB — PROTIME-INR
INR: 0.99 (ref 0.00–1.49)
INR: 1.12 (ref 0.00–1.49)
INR: 1.25 (ref 0.00–1.49)
INR: 1.44 (ref 0.00–1.49)
INR: 1.53 — ABNORMAL HIGH (ref 0.00–1.49)
INR: 1.54 — ABNORMAL HIGH (ref 0.00–1.49)
INR: 1.73 — ABNORMAL HIGH (ref 0.00–1.49)
Prothrombin Time: 17.7 seconds — ABNORMAL HIGH (ref 11.6–15.2)
Prothrombin Time: 18.6 seconds — ABNORMAL HIGH (ref 11.6–15.2)
Prothrombin Time: 18.7 seconds — ABNORMAL HIGH (ref 11.6–15.2)
Prothrombin Time: 19.6 seconds — ABNORMAL HIGH (ref 11.6–15.2)

## 2011-02-22 LAB — BASIC METABOLIC PANEL
BUN: 19 mg/dL (ref 6–23)
CO2: 26 mEq/L (ref 19–32)
Calcium: 7.7 mg/dL — ABNORMAL LOW (ref 8.4–10.5)
Chloride: 102 mEq/L (ref 96–112)
GFR calc Af Amer: 10 mL/min — ABNORMAL LOW (ref >60)
GFR calc Af Amer: 6 mL/min — ABNORMAL LOW (ref >60)
GFR calc Af Amer: 7 mL/min — ABNORMAL LOW (ref >60)
GFR calc non Af Amer: 5 mL/min — ABNORMAL LOW (ref >60)
GFR calc non Af Amer: 9 mL/min — ABNORMAL LOW (ref >60)
Glucose, Bld: 188 mg/dL — ABNORMAL HIGH (ref 70–99)
Potassium: 3.8 mEq/L (ref 3.5–5.1)
Potassium: 3.9 mEq/L (ref 3.5–5.1)
Potassium: 4.1 mEq/L (ref 3.5–5.1)
Sodium: 133 mEq/L — ABNORMAL LOW (ref 135–145)
Sodium: 141 mEq/L (ref 135–145)

## 2011-02-22 LAB — MRSA PCR SCREENING: MRSA by PCR: NEGATIVE

## 2011-02-22 LAB — CARDIAC PANEL(CRET KIN+CKTOT+MB+TROPI)
CK, MB: 0.7 ng/mL (ref 0.3–4.0)
CK, MB: 0.8 ng/mL (ref 0.3–4.0)
Relative Index: INVALID (ref 0.0–2.5)
Relative Index: INVALID (ref 0.0–2.5)
Total CK: 31 U/L (ref 7–177)
Troponin I: 0.02 ng/mL (ref 0.00–0.06)
Troponin I: 0.04 ng/mL (ref 0.00–0.06)

## 2011-02-22 LAB — DIFFERENTIAL
Basophils Absolute: 0 10*3/uL (ref 0.0–0.1)
Basophils Relative: 0 % (ref 0–1)
Eosinophils Relative: 1 % (ref 0–5)
Lymphocytes Relative: 10 % — ABNORMAL LOW (ref 12–46)
Lymphs Abs: 1 10*3/uL (ref 0.7–4.0)
Monocytes Absolute: 0.2 10*3/uL (ref 0.1–1.0)
Monocytes Relative: 3 % (ref 3–12)
Neutro Abs: 11.9 10*3/uL — ABNORMAL HIGH (ref 1.7–7.7)
Neutrophils Relative %: 89 % — ABNORMAL HIGH (ref 43–77)

## 2011-02-22 LAB — POCT I-STAT, CHEM 8
BUN: 32 mg/dL — ABNORMAL HIGH (ref 6–23)
Chloride: 104 mEq/L (ref 96–112)
Creatinine, Ser: 6.7 mg/dL — ABNORMAL HIGH (ref 0.4–1.2)
Sodium: 135 mEq/L (ref 135–145)
TCO2: 22 mmol/L (ref 0–100)

## 2011-02-22 LAB — HEPARIN LEVEL (UNFRACTIONATED)
Heparin Unfractionated: 0.24 IU/mL — ABNORMAL LOW (ref 0.30–0.70)
Heparin Unfractionated: 0.57 IU/mL (ref 0.30–0.70)
Heparin Unfractionated: 0.67 IU/mL (ref 0.30–0.70)
Heparin Unfractionated: 0.88 IU/mL — ABNORMAL HIGH (ref 0.30–0.70)
Heparin Unfractionated: 0.91 IU/mL — ABNORMAL HIGH (ref 0.30–0.70)

## 2011-02-22 LAB — CULTURE, BLOOD (ROUTINE X 2)

## 2011-02-22 LAB — LACTIC ACID, PLASMA: Lactic Acid, Venous: 1.5 mmol/L (ref 0.5–2.2)

## 2011-02-22 LAB — PROCALCITONIN: Procalcitonin: 1.31 ng/mL

## 2011-02-23 LAB — POTASSIUM: Potassium: 5.2 mEq/L — ABNORMAL HIGH (ref 3.5–5.1)

## 2011-02-23 LAB — PROTIME-INR
INR: 1.09 (ref 0.00–1.49)
Prothrombin Time: 14.3 seconds (ref 11.6–15.2)

## 2011-02-23 LAB — APTT: aPTT: 27 seconds (ref 24–37)

## 2011-02-24 LAB — CBC
HCT: 33.9 % — ABNORMAL LOW (ref 36.0–46.0)
Hemoglobin: 11.3 g/dL — ABNORMAL LOW (ref 12.0–15.0)
MCH: 29.6 pg (ref 26.0–34.0)
MCHC: 33.2 g/dL (ref 30.0–36.0)
MCV: 89.2 fL (ref 78.0–100.0)
Platelets: 266 10*3/uL (ref 150–400)
RBC: 3.8 MIL/uL — ABNORMAL LOW (ref 3.87–5.11)
RDW: 14.5 % (ref 11.5–15.5)
WBC: 6 10*3/uL (ref 4.0–10.5)

## 2011-03-12 LAB — PROTIME-INR
INR: 2.78 — ABNORMAL HIGH (ref 0.00–1.49)
Prothrombin Time: 29.1 seconds — ABNORMAL HIGH (ref 11.6–15.2)

## 2011-03-12 LAB — CBC
HCT: 39.4 % (ref 36.0–46.0)
Hemoglobin: 13.2 g/dL (ref 12.0–15.0)
MCHC: 33.4 g/dL (ref 30.0–36.0)
MCV: 91.5 fL (ref 78.0–100.0)
Platelets: 183 10*3/uL (ref 150–400)
RBC: 4.3 MIL/uL (ref 3.87–5.11)
RDW: 15.7 % — ABNORMAL HIGH (ref 11.5–15.5)
WBC: 5.1 K/uL (ref 4.0–10.5)

## 2011-03-12 LAB — APTT: aPTT: 47 s — ABNORMAL HIGH (ref 24–37)

## 2011-03-14 IMAGING — CR DG FINGER THUMB 2+V*R*
3 series · 3 of 3 positions shown · non-contrast
Comparison: None

CLINICAL DATA: Right thumb pain.  No known injury.

RIGHT THUMB 2+V

[x finger pa right]
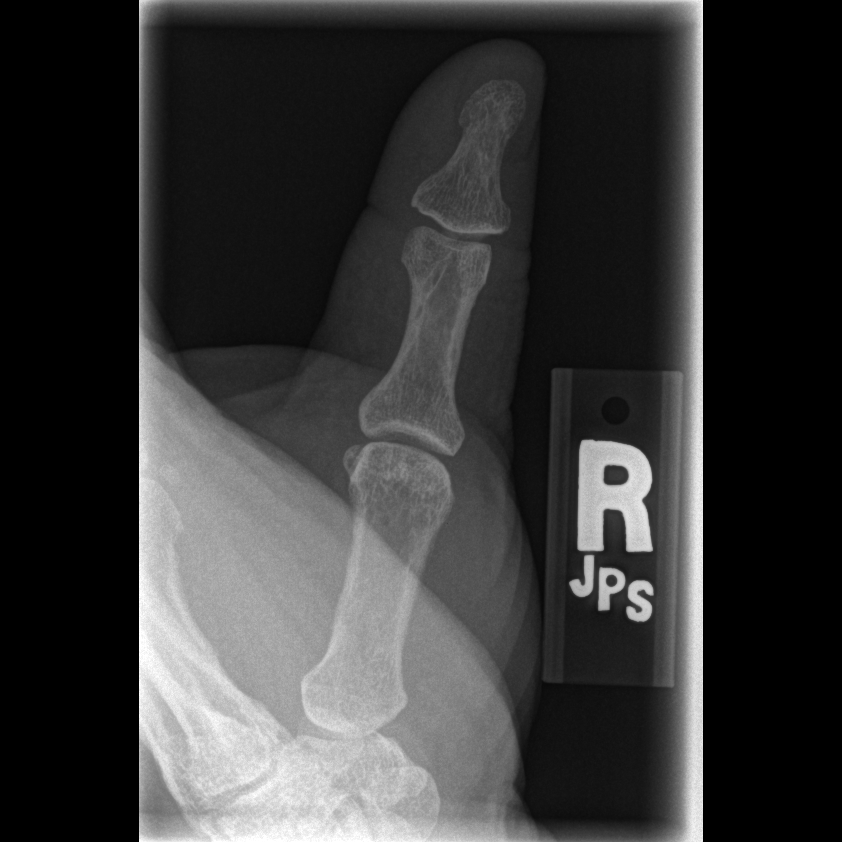

[x finger obl. right]
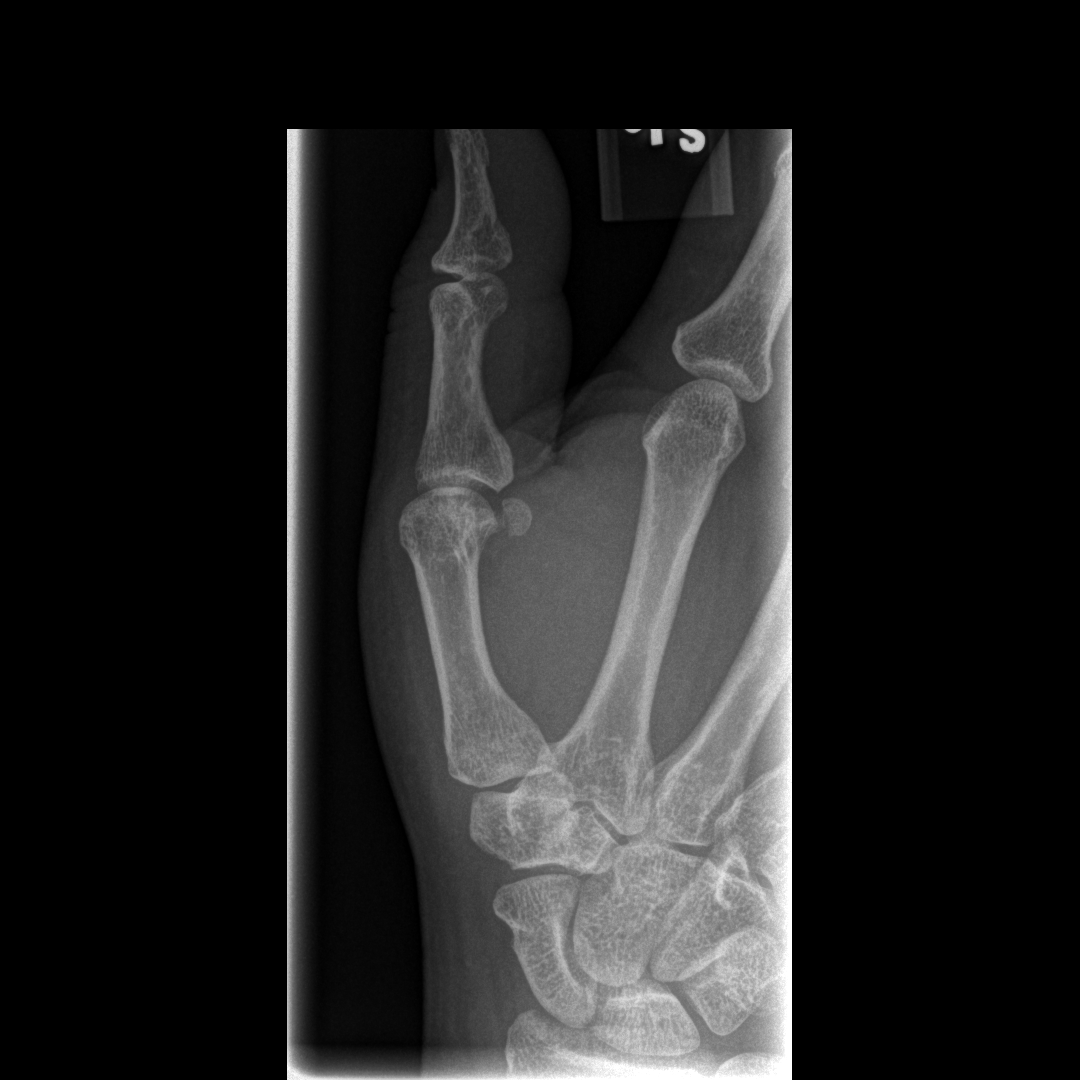

[x finger lateral right]
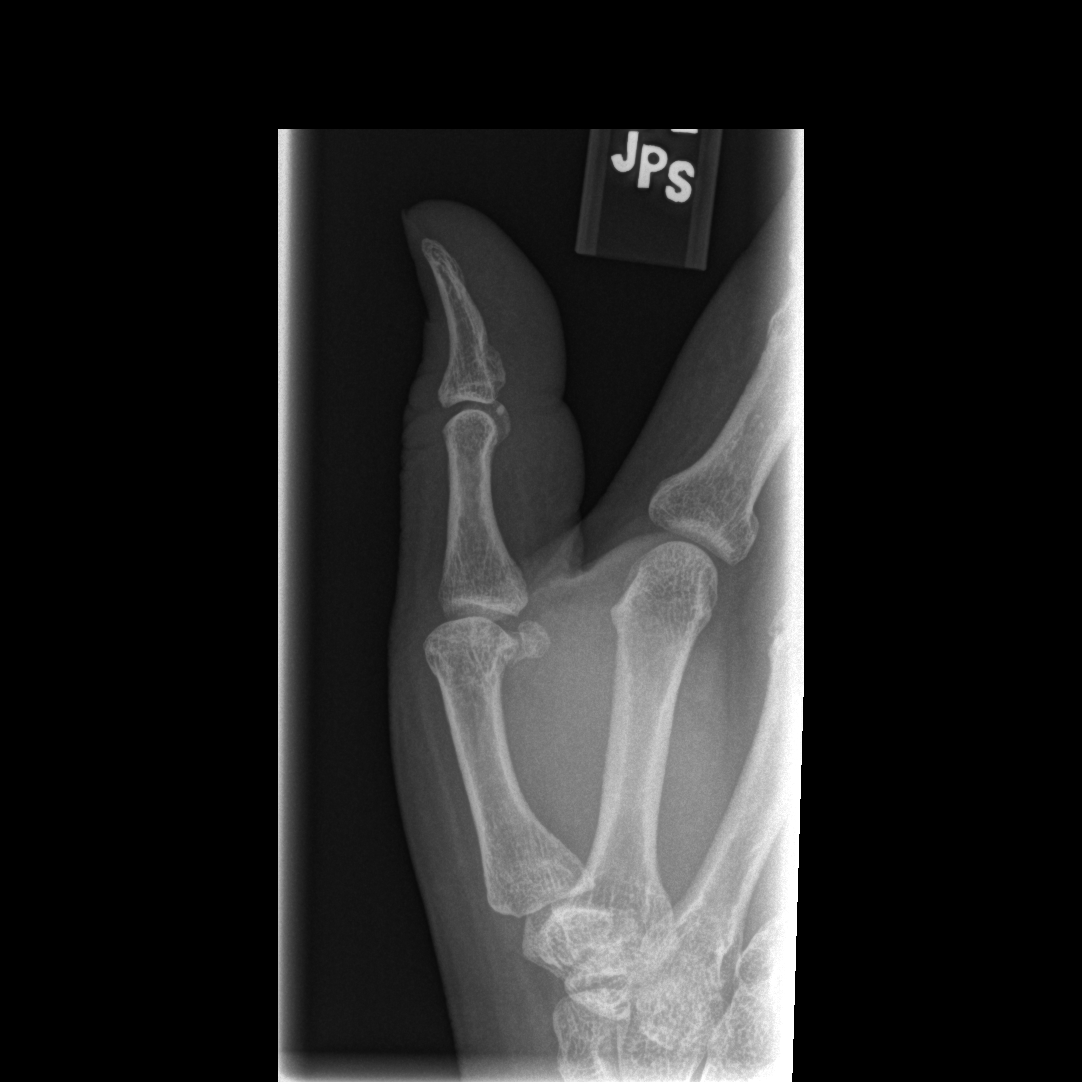

[3 of 3 positions shown; findings below may reference images not displayed]

FINDINGS: The joint spaces are maintained.  No fractures are seen.
No destructive bony changes or arthropathic findings.
IMPRESSION: No acute bony findings.

## 2011-03-16 LAB — POCT I-STAT 4, (NA,K, GLUC, HGB,HCT): Glucose, Bld: 90 mg/dL (ref 70–99)

## 2011-03-16 LAB — GLUCOSE, CAPILLARY
Glucose-Capillary: 113 mg/dL — ABNORMAL HIGH (ref 70–99)
Glucose-Capillary: 117 mg/dL — ABNORMAL HIGH (ref 70–99)

## 2011-03-16 LAB — PROTIME-INR: Prothrombin Time: 12.9 seconds (ref 11.6–15.2)

## 2011-03-18 LAB — CBC
HCT: 38.2 % (ref 36.0–46.0)
MCV: 87.8 fL (ref 78.0–100.0)
Platelets: 226 10*3/uL (ref 150–400)
WBC: 5.2 10*3/uL (ref 4.0–10.5)

## 2011-03-19 LAB — POCT I-STAT 4, (NA,K, GLUC, HGB,HCT)
Glucose, Bld: 97 mg/dL (ref 70–99)
Hemoglobin: 16.3 g/dL — ABNORMAL HIGH (ref 12.0–15.0)

## 2011-03-19 LAB — APTT: aPTT: 34 seconds (ref 24–37)

## 2011-03-19 LAB — GLUCOSE, CAPILLARY
Glucose-Capillary: 98 mg/dL (ref 70–99)
Glucose-Capillary: 99 mg/dL (ref 70–99)

## 2011-03-19 LAB — PROTIME-INR: INR: 1.1 (ref 0.00–1.49)

## 2011-03-20 LAB — PROTIME-INR
INR: 1.2 (ref 0.00–1.49)
INR: 1.2 (ref 0.00–1.49)
INR: 2 — ABNORMAL HIGH (ref 0.00–1.49)
INR: 1.5 (ref 0.00–1.49)
INR: 1.9 — ABNORMAL HIGH (ref 0.00–1.49)
INR: 1.9 — ABNORMAL HIGH (ref 0.00–1.49)
INR: 2 — ABNORMAL HIGH (ref 0.00–1.49)
INR: 2.1 — ABNORMAL HIGH (ref 0.00–1.49)
INR: 2.3 — ABNORMAL HIGH (ref 0.00–1.49)
Prothrombin Time: 15.6 seconds — ABNORMAL HIGH (ref 11.6–15.2)
Prothrombin Time: 23.4 seconds — ABNORMAL HIGH (ref 11.6–15.2)
Prothrombin Time: 22.6 seconds — ABNORMAL HIGH (ref 11.6–15.2)
Prothrombin Time: 23.1 seconds — ABNORMAL HIGH (ref 11.6–15.2)
Prothrombin Time: 24 seconds — ABNORMAL HIGH (ref 11.6–15.2)
Prothrombin Time: 24.6 seconds — ABNORMAL HIGH (ref 11.6–15.2)
Prothrombin Time: 27.5 seconds — ABNORMAL HIGH (ref 11.6–15.2)

## 2011-03-20 LAB — RENAL FUNCTION PANEL
Albumin: 3.2 g/dL — ABNORMAL LOW (ref 3.5–5.2)
Albumin: 3.4 g/dL — ABNORMAL LOW (ref 3.5–5.2)
Albumin: 3.2 g/dL — ABNORMAL LOW (ref 3.5–5.2)
BUN: 13 mg/dL (ref 6–23)
BUN: 16 mg/dL (ref 6–23)
BUN: 7 mg/dL (ref 6–23)
BUN: 8 mg/dL (ref 6–23)
BUN: 12 mg/dL (ref 6–23)
BUN: 8 mg/dL (ref 6–23)
CO2: 27 mEq/L (ref 19–32)
CO2: 29 mEq/L (ref 19–32)
CO2: 23 mEq/L (ref 19–32)
CO2: 27 mEq/L (ref 19–32)
Calcium: 7.5 mg/dL — ABNORMAL LOW (ref 8.4–10.5)
Calcium: 8 mg/dL — ABNORMAL LOW (ref 8.4–10.5)
Calcium: 8 mg/dL — ABNORMAL LOW (ref 8.4–10.5)
Calcium: 8.3 mg/dL — ABNORMAL LOW (ref 8.4–10.5)
Calcium: 8.5 mg/dL (ref 8.4–10.5)
Chloride: 100 mEq/L (ref 96–112)
Chloride: 101 mEq/L (ref 96–112)
Chloride: 96 mEq/L (ref 96–112)
Creatinine, Ser: 4.75 mg/dL — ABNORMAL HIGH (ref 0.4–1.2)
Creatinine, Ser: 6.37 mg/dL — ABNORMAL HIGH (ref 0.4–1.2)
Creatinine, Ser: 5.25 mg/dL — ABNORMAL HIGH (ref 0.4–1.2)
Creatinine, Ser: 6.65 mg/dL — ABNORMAL HIGH (ref 0.4–1.2)
GFR calc Af Amer: 12 mL/min — ABNORMAL LOW (ref >60)
GFR calc Af Amer: 8 mL/min — ABNORMAL LOW (ref >60)
GFR calc Af Amer: 11 mL/min — ABNORMAL LOW (ref >60)
GFR calc Af Amer: 6 mL/min — ABNORMAL LOW (ref >60)
GFR calc non Af Amer: 10 mL/min — ABNORMAL LOW (ref >60)
GFR calc non Af Amer: 7 mL/min — ABNORMAL LOW (ref >60)
GFR calc non Af Amer: 5 mL/min — ABNORMAL LOW (ref >60)
GFR calc non Af Amer: 7 mL/min — ABNORMAL LOW (ref >60)
Glucose, Bld: 101 mg/dL — ABNORMAL HIGH (ref 70–99)
Glucose, Bld: 112 mg/dL — ABNORMAL HIGH (ref 70–99)
Glucose, Bld: 95 mg/dL (ref 70–99)
Glucose, Bld: 109 mg/dL — ABNORMAL HIGH (ref 70–99)
Phosphorus: 1.8 mg/dL — ABNORMAL LOW (ref 2.3–4.6)
Phosphorus: 1.9 mg/dL — ABNORMAL LOW (ref 2.3–4.6)
Phosphorus: 2 mg/dL — ABNORMAL LOW (ref 2.3–4.6)
Phosphorus: 2.6 mg/dL (ref 2.3–4.6)
Phosphorus: 1.8 mg/dL — ABNORMAL LOW (ref 2.3–4.6)
Potassium: 3.7 mEq/L (ref 3.5–5.1)
Potassium: 3.6 mEq/L (ref 3.5–5.1)
Sodium: 136 mEq/L (ref 135–145)
Sodium: 137 mEq/L (ref 135–145)
Sodium: 137 mEq/L (ref 135–145)
Sodium: 135 mEq/L (ref 135–145)

## 2011-03-20 LAB — CBC
HCT: 30 % — ABNORMAL LOW (ref 36.0–46.0)
HCT: 31.6 % — ABNORMAL LOW (ref 36.0–46.0)
HCT: 29.2 % — ABNORMAL LOW (ref 36.0–46.0)
HCT: 30.2 % — ABNORMAL LOW (ref 36.0–46.0)
HCT: 30.6 % — ABNORMAL LOW (ref 36.0–46.0)
Hemoglobin: 10.4 g/dL — ABNORMAL LOW (ref 12.0–15.0)
Hemoglobin: 9.9 g/dL — ABNORMAL LOW (ref 12.0–15.0)
Hemoglobin: 10 g/dL — ABNORMAL LOW (ref 12.0–15.0)
Hemoglobin: 9.6 g/dL — ABNORMAL LOW (ref 12.0–15.0)
MCHC: 32.7 g/dL (ref 30.0–36.0)
MCHC: 32.9 g/dL (ref 30.0–36.0)
MCHC: 33 g/dL (ref 30.0–36.0)
MCHC: 32.6 g/dL (ref 30.0–36.0)
MCHC: 33 g/dL (ref 30.0–36.0)
MCHC: 33.1 g/dL (ref 30.0–36.0)
MCHC: 33.2 g/dL (ref 30.0–36.0)
MCHC: 33.4 g/dL (ref 30.0–36.0)
MCV: 88.3 fL (ref 78.0–100.0)
MCV: 87.7 fL (ref 78.0–100.0)
MCV: 88.8 fL (ref 78.0–100.0)
MCV: 89.1 fL (ref 78.0–100.0)
MCV: 89.1 fL (ref 78.0–100.0)
Platelets: 146 10*3/uL — ABNORMAL LOW (ref 150–400)
Platelets: 174 10*3/uL (ref 150–400)
Platelets: 177 10*3/uL (ref 150–400)
Platelets: 181 10*3/uL (ref 150–400)
Platelets: 129 10*3/uL — ABNORMAL LOW (ref 150–400)
Platelets: 130 10*3/uL — ABNORMAL LOW (ref 150–400)
Platelets: 137 10*3/uL — ABNORMAL LOW (ref 150–400)
Platelets: 140 10*3/uL — ABNORMAL LOW (ref 150–400)
Platelets: 165 10*3/uL (ref 150–400)
RBC: 3.37 MIL/uL — ABNORMAL LOW (ref 3.87–5.11)
RBC: 3.54 MIL/uL — ABNORMAL LOW (ref 3.87–5.11)
RBC: 3.29 MIL/uL — ABNORMAL LOW (ref 3.87–5.11)
RBC: 3.41 MIL/uL — ABNORMAL LOW (ref 3.87–5.11)
RBC: 3.57 MIL/uL — ABNORMAL LOW (ref 3.87–5.11)
RDW: 17.1 % — ABNORMAL HIGH (ref 11.5–15.5)
RDW: 17.5 % — ABNORMAL HIGH (ref 11.5–15.5)
RDW: 16.6 % — ABNORMAL HIGH (ref 11.5–15.5)
RDW: 16.6 % — ABNORMAL HIGH (ref 11.5–15.5)
RDW: 16.6 % — ABNORMAL HIGH (ref 11.5–15.5)
RDW: 17.2 % — ABNORMAL HIGH (ref 11.5–15.5)
RDW: 17.5 % — ABNORMAL HIGH (ref 11.5–15.5)
RDW: 17.6 % — ABNORMAL HIGH (ref 11.5–15.5)
WBC: 5.5 10*3/uL (ref 4.0–10.5)
WBC: 6 10*3/uL (ref 4.0–10.5)
WBC: 6.1 10*3/uL (ref 4.0–10.5)
WBC: 5 10*3/uL (ref 4.0–10.5)
WBC: 5.3 10*3/uL (ref 4.0–10.5)
WBC: 5.4 10*3/uL (ref 4.0–10.5)
WBC: 6.8 10*3/uL (ref 4.0–10.5)

## 2011-03-20 LAB — GLUCOSE, CAPILLARY
Glucose-Capillary: 115 mg/dL — ABNORMAL HIGH (ref 70–99)
Glucose-Capillary: 119 mg/dL — ABNORMAL HIGH (ref 70–99)
Glucose-Capillary: 120 mg/dL — ABNORMAL HIGH (ref 70–99)
Glucose-Capillary: 141 mg/dL — ABNORMAL HIGH (ref 70–99)
Glucose-Capillary: 80 mg/dL (ref 70–99)
Glucose-Capillary: 91 mg/dL (ref 70–99)
Glucose-Capillary: 95 mg/dL (ref 70–99)
Glucose-Capillary: 101 mg/dL — ABNORMAL HIGH (ref 70–99)
Glucose-Capillary: 110 mg/dL — ABNORMAL HIGH (ref 70–99)
Glucose-Capillary: 113 mg/dL — ABNORMAL HIGH (ref 70–99)
Glucose-Capillary: 114 mg/dL — ABNORMAL HIGH (ref 70–99)
Glucose-Capillary: 123 mg/dL — ABNORMAL HIGH (ref 70–99)
Glucose-Capillary: 129 mg/dL — ABNORMAL HIGH (ref 70–99)
Glucose-Capillary: 46 mg/dL — ABNORMAL LOW (ref 70–99)
Glucose-Capillary: 53 mg/dL — ABNORMAL LOW (ref 70–99)
Glucose-Capillary: 54 mg/dL — ABNORMAL LOW (ref 70–99)
Glucose-Capillary: 63 mg/dL — ABNORMAL LOW (ref 70–99)
Glucose-Capillary: 65 mg/dL — ABNORMAL LOW (ref 70–99)
Glucose-Capillary: 68 mg/dL — ABNORMAL LOW (ref 70–99)
Glucose-Capillary: 69 mg/dL — ABNORMAL LOW (ref 70–99)
Glucose-Capillary: 72 mg/dL (ref 70–99)
Glucose-Capillary: 87 mg/dL (ref 70–99)
Glucose-Capillary: 88 mg/dL (ref 70–99)
Glucose-Capillary: 89 mg/dL (ref 70–99)
Glucose-Capillary: 92 mg/dL (ref 70–99)
Glucose-Capillary: 94 mg/dL (ref 70–99)

## 2011-03-20 LAB — COMPREHENSIVE METABOLIC PANEL
ALT: 14 U/L (ref 0–35)
AST: 15 U/L (ref 0–37)
AST: 16 U/L (ref 0–37)
AST: 16 U/L (ref 0–37)
AST: 16 U/L (ref 0–37)
Albumin: 3.3 g/dL — ABNORMAL LOW (ref 3.5–5.2)
Albumin: 3.3 g/dL — ABNORMAL LOW (ref 3.5–5.2)
Albumin: 3.6 g/dL (ref 3.5–5.2)
Alkaline Phosphatase: 118 U/L — ABNORMAL HIGH (ref 39–117)
Alkaline Phosphatase: 87 U/L (ref 39–117)
Alkaline Phosphatase: 93 U/L (ref 39–117)
BUN: 14 mg/dL (ref 6–23)
BUN: 8 mg/dL (ref 6–23)
CO2: 24 mEq/L (ref 19–32)
CO2: 30 mEq/L (ref 19–32)
Calcium: 7.3 mg/dL — ABNORMAL LOW (ref 8.4–10.5)
Chloride: 100 mEq/L (ref 96–112)
Chloride: 101 mEq/L (ref 96–112)
Chloride: 104 mEq/L (ref 96–112)
Chloride: 98 mEq/L (ref 96–112)
Creatinine, Ser: 5.79 mg/dL — ABNORMAL HIGH (ref 0.4–1.2)
Creatinine, Ser: 7.12 mg/dL — ABNORMAL HIGH (ref 0.4–1.2)
Creatinine, Ser: 7.4 mg/dL — ABNORMAL HIGH (ref 0.4–1.2)
GFR calc Af Amer: 7 mL/min — ABNORMAL LOW (ref >60)
GFR calc Af Amer: 7 mL/min — ABNORMAL LOW (ref >60)
GFR calc Af Amer: 7 mL/min — ABNORMAL LOW (ref >60)
GFR calc non Af Amer: 6 mL/min — ABNORMAL LOW (ref >60)
GFR calc non Af Amer: 8 mL/min — ABNORMAL LOW (ref >60)
Potassium: 3.5 mEq/L (ref 3.5–5.1)
Potassium: 4 mEq/L (ref 3.5–5.1)
Sodium: 134 mEq/L — ABNORMAL LOW (ref 135–145)
Sodium: 139 mEq/L (ref 135–145)
Total Bilirubin: 0.8 mg/dL (ref 0.3–1.2)
Total Bilirubin: 0.9 mg/dL (ref 0.3–1.2)
Total Bilirubin: 0.9 mg/dL (ref 0.3–1.2)
Total Bilirubin: 1.3 mg/dL — ABNORMAL HIGH (ref 0.3–1.2)
Total Protein: 7.7 g/dL (ref 6.0–8.3)

## 2011-03-20 LAB — HEPARIN LEVEL (UNFRACTIONATED)
Heparin Unfractionated: 0.32 IU/mL (ref 0.30–0.70)
Heparin Unfractionated: 0.45 IU/mL (ref 0.30–0.70)
Heparin Unfractionated: 0.48 IU/mL (ref 0.30–0.70)
Heparin Unfractionated: <0.1 IU/mL — ABNORMAL LOW (ref 0.30–0.70)
Heparin Unfractionated: 0.15 IU/mL — ABNORMAL LOW (ref 0.30–0.70)
Heparin Unfractionated: 0.5 IU/mL (ref 0.30–0.70)
Heparin Unfractionated: 0.64 IU/mL (ref 0.30–0.70)
Heparin Unfractionated: 0.95 IU/mL — ABNORMAL HIGH (ref 0.30–0.70)
Heparin Unfractionated: <0.1 IU/mL — ABNORMAL LOW (ref 0.30–0.70)

## 2011-03-20 LAB — BASIC METABOLIC PANEL
BUN: 6 mg/dL (ref 6–23)
BUN: 9 mg/dL (ref 6–23)
CO2: 26 mEq/L (ref 19–32)
Chloride: 98 mEq/L (ref 96–112)
Creatinine, Ser: 4.78 mg/dL — ABNORMAL HIGH (ref 0.4–1.2)
Creatinine, Ser: 6.9 mg/dL — ABNORMAL HIGH (ref 0.4–1.2)
GFR calc non Af Amer: 10 mL/min — ABNORMAL LOW (ref >60)
Glucose, Bld: 141 mg/dL — ABNORMAL HIGH (ref 70–99)
Glucose, Bld: 98 mg/dL (ref 70–99)
Potassium: 4.6 mEq/L (ref 3.5–5.1)

## 2011-03-20 LAB — CORTISOL-AM, BLOOD: Cortisol - AM: 8.6 ug/dL (ref 4.3–22.4)

## 2011-03-20 LAB — CULTURE, BLOOD (ROUTINE X 2)
Culture: NO GROWTH
Culture: NO GROWTH

## 2011-03-20 LAB — HEMOGLOBIN A1C
Hgb A1c MFr Bld: 6.4 % — ABNORMAL HIGH (ref 4.6–6.1)
Mean Plasma Glucose: 137 mg/dL

## 2011-03-20 LAB — DIFFERENTIAL
Basophils Absolute: 0 10*3/uL (ref 0.0–0.1)
Basophils Absolute: 0 10*3/uL (ref 0.0–0.1)
Basophils Relative: 0 % (ref 0–1)
Basophils Relative: 1 % (ref 0–1)
Eosinophils Absolute: 0.2 10*3/uL (ref 0.0–0.7)
Eosinophils Relative: 3 % (ref 0–5)
Lymphocytes Relative: 21 % (ref 12–46)
Monocytes Relative: 6 % (ref 3–12)
Neutro Abs: 3.8 10*3/uL (ref 1.7–7.7)
Neutrophils Relative %: 69 % (ref 43–77)
Neutrophils Relative %: 71 % (ref 43–77)

## 2011-03-20 LAB — POCT I-STAT 3, ART BLOOD GAS (G3+)
Acid-Base Excess: 1 mmol/L (ref 0.0–2.0)
O2 Saturation: 88 %
pO2, Arterial: 52 mmHg — ABNORMAL LOW (ref 80.0–100.0)

## 2011-03-20 LAB — CARDIAC PANEL(CRET KIN+CKTOT+MB+TROPI)
CK, MB: 0.9 ng/mL (ref 0.3–4.0)
Relative Index: INVALID (ref 0.0–2.5)
Relative Index: INVALID (ref 0.0–2.5)
Total CK: 37 U/L (ref 7–177)
Troponin I: 0.01 ng/mL (ref 0.00–0.06)
Troponin I: 0.02 ng/mL (ref 0.00–0.06)

## 2011-03-20 LAB — LIPID PANEL
Triglycerides: 158 mg/dL — ABNORMAL HIGH (ref <150)
VLDL: 32 mg/dL (ref 0–40)

## 2011-03-20 LAB — CK TOTAL AND CKMB (NOT AT ARMC): Relative Index: INVALID (ref 0.0–2.5)

## 2011-03-20 LAB — SEDIMENTATION RATE: Sed Rate: 30 mm/hr — ABNORMAL HIGH (ref 0–22)

## 2011-03-20 LAB — PHOSPHORUS
Phosphorus: 2.1 mg/dL — ABNORMAL LOW (ref 2.3–4.6)
Phosphorus: 2.6 mg/dL (ref 2.3–4.6)
Phosphorus: 2.7 mg/dL (ref 2.3–4.6)

## 2011-03-20 LAB — APTT
aPTT: 39 seconds — ABNORMAL HIGH (ref 24–37)
aPTT: 54 seconds — ABNORMAL HIGH (ref 24–37)
aPTT: >200 seconds (ref 24–37)

## 2011-03-20 LAB — CALCIUM: Calcium: 7.7 mg/dL — ABNORMAL LOW (ref 8.4–10.5)

## 2011-03-20 LAB — TROPONIN I: Troponin I: 0.02 ng/mL (ref 0.00–0.06)

## 2011-03-20 LAB — TSH: TSH: 1.127 u[IU]/mL (ref 0.350–4.500)

## 2011-03-21 LAB — GLUCOSE, CAPILLARY
Glucose-Capillary: 115 mg/dL — ABNORMAL HIGH (ref 70–99)
Glucose-Capillary: 129 mg/dL — ABNORMAL HIGH (ref 70–99)
Glucose-Capillary: 169 mg/dL — ABNORMAL HIGH (ref 70–99)
Glucose-Capillary: 217 mg/dL — ABNORMAL HIGH (ref 70–99)
Glucose-Capillary: 229 mg/dL — ABNORMAL HIGH (ref 70–99)
Glucose-Capillary: 242 mg/dL — ABNORMAL HIGH (ref 70–99)
Glucose-Capillary: 242 mg/dL — ABNORMAL HIGH (ref 70–99)
Glucose-Capillary: 247 mg/dL — ABNORMAL HIGH (ref 70–99)
Glucose-Capillary: 72 mg/dL (ref 70–99)
Glucose-Capillary: 84 mg/dL (ref 70–99)
Glucose-Capillary: 88 mg/dL (ref 70–99)
Glucose-Capillary: 273 mg/dL — ABNORMAL HIGH (ref 70–99)
Glucose-Capillary: 288 mg/dL — ABNORMAL HIGH (ref 70–99)

## 2011-03-21 LAB — DIFFERENTIAL
Basophils Absolute: 0 10*3/uL (ref 0.0–0.1)
Basophils Relative: 0 % (ref 0–1)
Basophils Relative: 0 % (ref 0–1)
Eosinophils Absolute: 0 10*3/uL (ref 0.0–0.7)
Eosinophils Absolute: 0.1 10*3/uL (ref 0.0–0.7)
Eosinophils Relative: 0 % (ref 0–5)
Eosinophils Relative: 5 % (ref 0–5)
Lymphocytes Relative: 19 % (ref 12–46)
Lymphs Abs: 0.8 10*3/uL (ref 0.7–4.0)
Lymphs Abs: 1.3 10*3/uL (ref 0.7–4.0)
Lymphs Abs: 1.6 10*3/uL (ref 0.7–4.0)
Monocytes Absolute: 0.5 10*3/uL (ref 0.1–1.0)
Monocytes Absolute: 0.6 10*3/uL (ref 0.1–1.0)
Monocytes Absolute: 0.6 10*3/uL (ref 0.1–1.0)
Monocytes Relative: 5 % (ref 3–12)
Monocytes Relative: 5 % (ref 3–12)
Monocytes Relative: 7 % (ref 3–12)
Neutro Abs: 10.3 10*3/uL — ABNORMAL HIGH (ref 1.7–7.7)
Neutrophils Relative %: 82 % — ABNORMAL HIGH (ref 43–77)
Neutrophils Relative %: 89 % — ABNORMAL HIGH (ref 43–77)

## 2011-03-21 LAB — COMPREHENSIVE METABOLIC PANEL
ALT: 8 U/L (ref 0–35)
ALT: <8 U/L (ref 0–35)
Albumin: 3 g/dL — ABNORMAL LOW (ref 3.5–5.2)
Albumin: 3.3 g/dL — ABNORMAL LOW (ref 3.5–5.2)
Alkaline Phosphatase: 104 U/L (ref 39–117)
Alkaline Phosphatase: 104 U/L (ref 39–117)
CO2: 22 mEq/L (ref 19–32)
Calcium: 7.9 mg/dL — ABNORMAL LOW (ref 8.4–10.5)
Chloride: 93 mEq/L — ABNORMAL LOW (ref 96–112)
Chloride: 94 mEq/L — ABNORMAL LOW (ref 96–112)
Creatinine, Ser: 5.18 mg/dL — ABNORMAL HIGH (ref 0.4–1.2)
GFR calc non Af Amer: 9 mL/min — ABNORMAL LOW (ref >60)
Glucose, Bld: 190 mg/dL — ABNORMAL HIGH (ref 70–99)
Glucose, Bld: 272 mg/dL — ABNORMAL HIGH (ref 70–99)
Potassium: 4.1 mEq/L (ref 3.5–5.1)
Potassium: 4.2 mEq/L (ref 3.5–5.1)
Sodium: 133 mEq/L — ABNORMAL LOW (ref 135–145)
Sodium: 135 mEq/L (ref 135–145)
Total Bilirubin: 0.7 mg/dL (ref 0.3–1.2)
Total Bilirubin: 1.2 mg/dL (ref 0.3–1.2)
Total Protein: 6.9 g/dL (ref 6.0–8.3)
Total Protein: 7.3 g/dL (ref 6.0–8.3)

## 2011-03-21 LAB — TYPE AND SCREEN: Antibody Screen: NEGATIVE

## 2011-03-21 LAB — CBC
HCT: 20.4 % — ABNORMAL LOW (ref 36.0–46.0)
HCT: 21 % — ABNORMAL LOW (ref 36.0–46.0)
HCT: 25.4 % — ABNORMAL LOW (ref 36.0–46.0)
HCT: 27 % — ABNORMAL LOW (ref 36.0–46.0)
HCT: 28 % — ABNORMAL LOW (ref 36.0–46.0)
Hemoglobin: 7.2 g/dL — CL (ref 12.0–15.0)
Hemoglobin: 8.4 g/dL — ABNORMAL LOW (ref 12.0–15.0)
Hemoglobin: 8.4 g/dL — ABNORMAL LOW (ref 12.0–15.0)
Hemoglobin: 8.5 g/dL — ABNORMAL LOW (ref 12.0–15.0)
Hemoglobin: 9.2 g/dL — ABNORMAL LOW (ref 12.0–15.0)
Hemoglobin: 10.8 g/dL — ABNORMAL LOW (ref 12.0–15.0)
MCHC: 30.8 g/dL (ref 30.0–36.0)
MCHC: 33 g/dL (ref 30.0–36.0)
MCHC: 33.8 g/dL (ref 30.0–36.0)
MCHC: 32.8 g/dL (ref 30.0–36.0)
MCV: 87.1 fL (ref 78.0–100.0)
MCV: 87.5 fL (ref 78.0–100.0)
MCV: 88.1 fL (ref 78.0–100.0)
MCV: 87.7 fL (ref 78.0–100.0)
Platelets: 202 10*3/uL (ref 150–400)
Platelets: 208 10*3/uL (ref 150–400)
Platelets: 221 10*3/uL (ref 150–400)
Platelets: 224 10*3/uL (ref 150–400)
RBC: 2.41 MIL/uL — ABNORMAL LOW (ref 3.87–5.11)
RBC: 2.88 MIL/uL — ABNORMAL LOW (ref 3.87–5.11)
RBC: 2.97 MIL/uL — ABNORMAL LOW (ref 3.87–5.11)
RBC: 3.09 MIL/uL — ABNORMAL LOW (ref 3.87–5.11)
RBC: 3.13 MIL/uL — ABNORMAL LOW (ref 3.87–5.11)
RBC: 3.2 MIL/uL — ABNORMAL LOW (ref 3.87–5.11)
RDW: 15.1 % (ref 11.5–15.5)
RDW: 15.1 % (ref 11.5–15.5)
RDW: 15.3 % (ref 11.5–15.5)
RDW: 15.7 % — ABNORMAL HIGH (ref 11.5–15.5)
RDW: 14.6 % (ref 11.5–15.5)
WBC: 10.6 10*3/uL — ABNORMAL HIGH (ref 4.0–10.5)
WBC: 12.5 10*3/uL — ABNORMAL HIGH (ref 4.0–10.5)
WBC: 16.2 10*3/uL — ABNORMAL HIGH (ref 4.0–10.5)
WBC: 19.4 10*3/uL — ABNORMAL HIGH (ref 4.0–10.5)
WBC: 6.8 10*3/uL (ref 4.0–10.5)
WBC: 8.9 10*3/uL (ref 4.0–10.5)

## 2011-03-21 LAB — BASIC METABOLIC PANEL
BUN: 24 mg/dL — ABNORMAL HIGH (ref 6–23)
BUN: 29 mg/dL — ABNORMAL HIGH (ref 6–23)
CO2: 27 mEq/L (ref 19–32)
CO2: 28 mEq/L (ref 19–32)
CO2: 29 mEq/L (ref 19–32)
Calcium: 7.9 mg/dL — ABNORMAL LOW (ref 8.4–10.5)
Chloride: 98 mEq/L (ref 96–112)
Chloride: 99 mEq/L (ref 96–112)
Creatinine, Ser: 4.66 mg/dL — ABNORMAL HIGH (ref 0.4–1.2)
Creatinine, Ser: 6.87 mg/dL — ABNORMAL HIGH (ref 0.4–1.2)
GFR calc Af Amer: 12 mL/min — ABNORMAL LOW (ref >60)
Glucose, Bld: 206 mg/dL — ABNORMAL HIGH (ref 70–99)
Potassium: 3.1 mEq/L — ABNORMAL LOW (ref 3.5–5.1)
Potassium: 3.9 mEq/L (ref 3.5–5.1)

## 2011-03-21 LAB — CROSSMATCH: Antibody Screen: NEGATIVE

## 2011-03-21 LAB — CULTURE, BLOOD (ROUTINE X 2): Culture: NO GROWTH

## 2011-03-21 LAB — POCT I-STAT 3, ART BLOOD GAS (G3+)
O2 Saturation: 94 %
Patient temperature: 97.9
TCO2: 25 mmol/L (ref 0–100)
pCO2 arterial: 42.4 mmHg (ref 35.0–45.0)
pH, Arterial: 7.348 — ABNORMAL LOW (ref 7.350–7.400)
pH, Arterial: 7.45 — ABNORMAL HIGH (ref 7.350–7.400)

## 2011-03-21 LAB — RENAL FUNCTION PANEL
BUN: 21 mg/dL (ref 6–23)
CO2: 29 mEq/L (ref 19–32)
Chloride: 99 mEq/L (ref 96–112)
Creatinine, Ser: 6.8 mg/dL — ABNORMAL HIGH (ref 0.4–1.2)

## 2011-03-21 LAB — RETICULOCYTES: Retic Ct Pct: 2.7 % (ref 0.4–3.1)

## 2011-03-21 LAB — MAGNESIUM: Magnesium: 1.9 mg/dL (ref 1.5–2.5)

## 2011-03-21 LAB — IRON AND TIBC
Iron: 77 ug/dL (ref 42–135)
Saturation Ratios: 27 % (ref 20–55)
TIBC: 282 ug/dL (ref 250–470)

## 2011-03-21 LAB — CALCIUM: Calcium: 6.8 mg/dL — ABNORMAL LOW (ref 8.4–10.5)

## 2011-03-21 LAB — WOUND CULTURE

## 2011-03-21 LAB — VITAMIN B12: Vitamin B-12: 664 pg/mL (ref 211–911)

## 2011-03-21 LAB — PROTIME-INR
INR: 1.1 (ref 0.00–1.49)
INR: 1.4 (ref 0.00–1.49)
Prothrombin Time: 14.8 seconds (ref 11.6–15.2)
Prothrombin Time: 18.2 seconds — ABNORMAL HIGH (ref 11.6–15.2)
Prothrombin Time: 28.8 seconds — ABNORMAL HIGH (ref 11.6–15.2)

## 2011-03-21 LAB — BLOOD GAS, ARTERIAL
Acid-base deficit: 1.7 mmol/L (ref 0.0–2.0)
Drawn by: 246861
O2 Content: 1 L/min
Patient temperature: 98.6
pCO2 arterial: 41.8 mmHg (ref 35.0–45.0)
pH, Arterial: 7.359 (ref 7.350–7.400)

## 2011-03-21 LAB — CARDIAC PANEL(CRET KIN+CKTOT+MB+TROPI)
CK, MB: 0.8 ng/mL (ref 0.3–4.0)
Relative Index: INVALID (ref 0.0–2.5)
Total CK: 18 U/L (ref 7–177)
Troponin I: 0.08 ng/mL — ABNORMAL HIGH (ref 0.00–0.06)
Troponin I: 0.08 ng/mL — ABNORMAL HIGH (ref 0.00–0.06)

## 2011-03-21 LAB — TROPONIN I
Troponin I: 0.04 ng/mL (ref 0.00–0.06)
Troponin I: 0.05 ng/mL (ref 0.00–0.06)

## 2011-03-21 LAB — BRAIN NATRIURETIC PEPTIDE: Pro B Natriuretic peptide (BNP): 313 pg/mL — ABNORMAL HIGH (ref 0.0–100.0)

## 2011-03-21 LAB — POCT CARDIAC MARKERS

## 2011-03-21 LAB — HEMOCCULT GUIAC POC 1CARD (OFFICE): Fecal Occult Bld: NEGATIVE

## 2011-03-21 LAB — CK TOTAL AND CKMB (NOT AT ARMC): Total CK: 18 U/L (ref 7–177)

## 2011-03-21 LAB — APTT: aPTT: 32 seconds (ref 24–37)

## 2011-03-22 LAB — CBC
HCT: 33 % — ABNORMAL LOW (ref 36.0–46.0)
Hemoglobin: 11 g/dL — ABNORMAL LOW (ref 12.0–15.0)
MCV: 87.2 fL (ref 78.0–100.0)
Platelets: 274 10*3/uL (ref 150–400)
RBC: 3.78 MIL/uL — ABNORMAL LOW (ref 3.87–5.11)
WBC: 7.9 10*3/uL (ref 4.0–10.5)

## 2011-03-22 LAB — GLUCOSE, CAPILLARY: Glucose-Capillary: 190 mg/dL — ABNORMAL HIGH (ref 70–99)

## 2011-03-22 LAB — BASIC METABOLIC PANEL
BUN: 50 mg/dL — ABNORMAL HIGH (ref 6–23)
Chloride: 97 mEq/L (ref 96–112)
GFR calc non Af Amer: 4 mL/min — ABNORMAL LOW (ref >60)
Potassium: 3.9 mEq/L (ref 3.5–5.1)
Sodium: 134 mEq/L — ABNORMAL LOW (ref 135–145)

## 2011-03-22 LAB — APTT: aPTT: 37 seconds (ref 24–37)

## 2011-03-26 LAB — BASIC METABOLIC PANEL
CO2: 30 mEq/L (ref 19–32)
Chloride: 97 mEq/L (ref 96–112)
GFR calc Af Amer: 11 mL/min — ABNORMAL LOW (ref >60)
Potassium: 3.9 mEq/L (ref 3.5–5.1)
Sodium: 137 mEq/L (ref 135–145)

## 2011-03-26 LAB — CBC
HCT: 35.3 % — ABNORMAL LOW (ref 36.0–46.0)
HCT: 37.7 % (ref 36.0–46.0)
Hemoglobin: 11.4 g/dL — ABNORMAL LOW (ref 12.0–15.0)
Hemoglobin: 12.4 g/dL (ref 12.0–15.0)
MCHC: 32.8 g/dL (ref 30.0–36.0)
MCV: 88.2 fL (ref 78.0–100.0)
RBC: 3.91 MIL/uL (ref 3.87–5.11)
RBC: 4.27 MIL/uL (ref 3.87–5.11)
RDW: 14.1 % (ref 11.5–15.5)
WBC: 6.8 10*3/uL (ref 4.0–10.5)

## 2011-03-26 LAB — GLUCOSE, CAPILLARY
Glucose-Capillary: 117 mg/dL — ABNORMAL HIGH (ref 70–99)
Glucose-Capillary: 166 mg/dL — ABNORMAL HIGH (ref 70–99)
Glucose-Capillary: 248 mg/dL — ABNORMAL HIGH (ref 70–99)

## 2011-03-26 LAB — RENAL FUNCTION PANEL
Albumin: 3.2 g/dL — ABNORMAL LOW (ref 3.5–5.2)
BUN: 35 mg/dL — ABNORMAL HIGH (ref 6–23)
Creatinine, Ser: 7.55 mg/dL — ABNORMAL HIGH (ref 0.4–1.2)
Glucose, Bld: 249 mg/dL — ABNORMAL HIGH (ref 70–99)
Phosphorus: 2.4 mg/dL (ref 2.3–4.6)
Potassium: 5.3 mEq/L — ABNORMAL HIGH (ref 3.5–5.1)

## 2011-03-27 LAB — GLUCOSE, CAPILLARY
Glucose-Capillary: 102 mg/dL — ABNORMAL HIGH (ref 70–99)
Glucose-Capillary: 102 mg/dL — ABNORMAL HIGH (ref 70–99)
Glucose-Capillary: 108 mg/dL — ABNORMAL HIGH (ref 70–99)
Glucose-Capillary: 108 mg/dL — ABNORMAL HIGH (ref 70–99)
Glucose-Capillary: 109 mg/dL — ABNORMAL HIGH (ref 70–99)
Glucose-Capillary: 134 mg/dL — ABNORMAL HIGH (ref 70–99)
Glucose-Capillary: 54 mg/dL — ABNORMAL LOW (ref 70–99)
Glucose-Capillary: 63 mg/dL — ABNORMAL LOW (ref 70–99)
Glucose-Capillary: 73 mg/dL (ref 70–99)
Glucose-Capillary: 89 mg/dL (ref 70–99)
Glucose-Capillary: 97 mg/dL (ref 70–99)

## 2011-03-27 LAB — RENAL FUNCTION PANEL
Albumin: 2.6 g/dL — ABNORMAL LOW (ref 3.5–5.2)
Albumin: 2.8 g/dL — ABNORMAL LOW (ref 3.5–5.2)
BUN: 17 mg/dL (ref 6–23)
BUN: 30 mg/dL — ABNORMAL HIGH (ref 6–23)
BUN: 39 mg/dL — ABNORMAL HIGH (ref 6–23)
CO2: 29 mEq/L (ref 19–32)
Calcium: 7.4 mg/dL — ABNORMAL LOW (ref 8.4–10.5)
Calcium: 7.7 mg/dL — ABNORMAL LOW (ref 8.4–10.5)
Calcium: 8.8 mg/dL (ref 8.4–10.5)
Chloride: 97 mEq/L (ref 96–112)
Creatinine, Ser: 5.56 mg/dL — ABNORMAL HIGH (ref 0.4–1.2)
Creatinine, Ser: 7.85 mg/dL — ABNORMAL HIGH (ref 0.4–1.2)
Creatinine, Ser: 9.74 mg/dL — ABNORMAL HIGH (ref 0.4–1.2)
GFR calc Af Amer: 10 mL/min — ABNORMAL LOW (ref >60)
GFR calc Af Amer: 5 mL/min — ABNORMAL LOW (ref >60)
GFR calc Af Amer: 7 mL/min — ABNORMAL LOW (ref >60)
GFR calc non Af Amer: 4 mL/min — ABNORMAL LOW (ref >60)
GFR calc non Af Amer: 5 mL/min — ABNORMAL LOW (ref >60)
GFR calc non Af Amer: 8 mL/min — ABNORMAL LOW (ref >60)
Glucose, Bld: 116 mg/dL — ABNORMAL HIGH (ref 70–99)
Phosphorus: 2.8 mg/dL (ref 2.3–4.6)

## 2011-03-27 LAB — CBC
HCT: 28.5 % — ABNORMAL LOW (ref 36.0–46.0)
HCT: 28.7 % — ABNORMAL LOW (ref 36.0–46.0)
HCT: 29.7 % — ABNORMAL LOW (ref 36.0–46.0)
MCHC: 31.5 g/dL (ref 30.0–36.0)
MCHC: 33.7 g/dL (ref 30.0–36.0)
MCV: 89.3 fL (ref 78.0–100.0)
MCV: 89.8 fL (ref 78.0–100.0)
MCV: 89.9 fL (ref 78.0–100.0)
Platelets: 204 10*3/uL (ref 150–400)
Platelets: 233 10*3/uL (ref 150–400)
RBC: 3.21 MIL/uL — ABNORMAL LOW (ref 3.87–5.11)
RDW: 14.2 % (ref 11.5–15.5)
WBC: 8.9 10*3/uL (ref 4.0–10.5)

## 2011-04-21 IMAGING — XA IR FLUORO GUIDE CV LINE*L*
1 series · 2 of 2 positions shown · non-contrast
Comparison: none

CLINICAL DATA: End-stage renal disease, chronic left femoral
hemodialysis catheter, exposed retention cuff requiring exchange

[Series 300: sp fluoro guide cv line*l* · 2 of 2 slices shown]
[im 1/2]
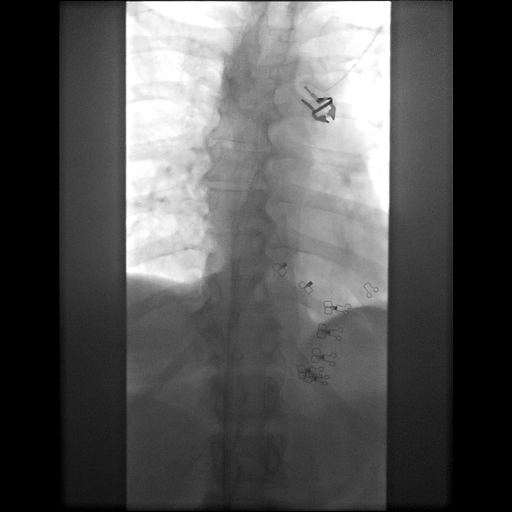
[im 2/2]
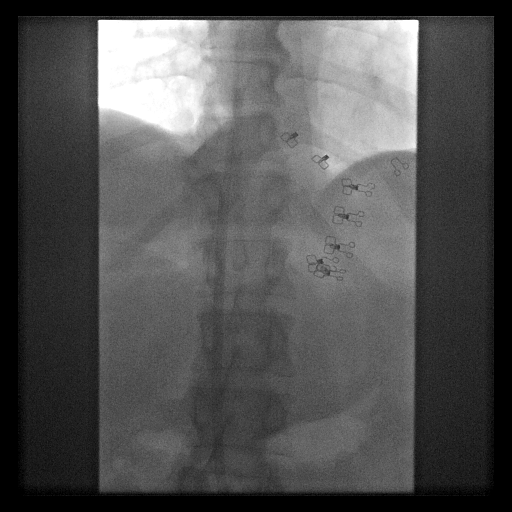

[2 of 2 positions shown; findings below may reference images not displayed]

LEFT FEMORAL TUNNELED HEMODIALYSIS CATHETER EXCHANGE

Date:  12/08/2009 [DATE]

Radiologist:  Quirijn Amazigh, M.D.

Medications:  1 gram vancomycin, 1% lidocaine at the access site

Guidance:  Fluoroscopic

Fluoroscopy time:  0.5 minutes

Sedation time:  None.

Contrast volume:  None.

Complications:  No immediate

PROCEDURE/FINDINGS:

Informed consent was obtained from the patient following
explanation of the procedure, risks, benefits and alternatives.
The patient understands, agrees and consents for the procedure.
All questions were addressed.  A time out was performed.

Maximal barrier sterile technique utilized including caps, mask,
sterile gowns, sterile gloves, large sterile drape, hand hygiene,
and betadine

Under sterile conditions and local anesthesia, the existing left
femoral tunneled dialysis catheter was removed over a stiff
Glidewire.  This was done under fluoroscopy.  Guide wire was
advanced into the right atrium.  A new 55 cm hemodialysis catheter
was advanced over the guide wire with the tips positioned at the
IVC RA junction.  Position confirmed with fluoroscopy.  Images were
obtained for documentation.  Blood was aspirated followed by saline
flushes.  Appropriate volume and strength of heparin was instilled
in both lumens.  Caps applied.  Catheter secured at the skin site
with Gelfoam pledgets in the subcutaneous tunnel and a pursestring
Prolene suture.  Sterile dressing applied.  No immediate
complication.  The patient tolerated the procedure well.
IMPRESSION: Fluoroscopic exchange of a left femoral 55 cm tunneled hemodialysis
catheter.

## 2011-04-24 NOTE — Assessment & Plan Note (Signed)
OFFICE VISIT   [name], [name]  DOB:  1961/01/10                                       11/24/2010  [id number]   CHIEF COMPLAINT:  Swelling in left thigh graft which was placed  10/13/2010.   HISTORY OF PRESENT ILLNESS:  The patient is a 50 year old woman who has  end-stage renal disease and had a left AV graft placed in the thigh on  the left side by Dr. Trula Slade on 10/13/2010.  After 1 month's time the  graft was stuck once and a hematoma formed over the lower pole at the  medial curvature of the graft.  The patient states that they have not  used the graft since.  The swelling has decreased.  She has been using  more compresses.  There is no tenderness.  She has had no fever.  She is  using more warm compresses to decrease the swelling.  There has been no  drainage from the wounds.   PHYSICAL EXAM:  Heart rate was 96, sats were 95 and her respiratory rate  was 12.  The left lower extremity was warm and pink.  She had a moderate  size firm hematoma over the lower medial pole of the AV graft.  She had  a positive thrill and bruit in the graft.  Her wounds were all well-  healed.  There was no drainage.  The area was nontender.  There was no  fluctuance.   ASSESSMENT:  Hematoma around AV Gore-Tex graft which was done  approximately 6 weeks ago.   PLAN:  To continue warm compresses and to watch the wound.  We would  suggest not using the graft until the hematoma is more resolved.  She  will follow up with [place] on an as-needed basis.   Wray Kearns, PA-C   Adele Barthel, MD  Electronically Signed   RR/MEDQ  D:  11/24/2010  T:  11/24/2010  Job:  [id number]   cc:   [facility]

## 2011-04-24 NOTE — Consult Note (Signed)
[name], [name]               ACCOUNT NO.:  [id number]   MEDICAL RECORD NO.:  [id number]          PATIENT TYPE:  INP   LOCATION:  2112                         FACILITY:  [facility]   PHYSICIAN:  Sol Blazing, M.D.DATE OF BIRTH:  1961-04-10   DATE OF CONSULTATION:  DATE OF DISCHARGE:                                 CONSULTATION   PRIMARY CARE PHYSICIAN:  CCM, Dr. Halford Chessman.   REASON FOR CONSULTATION:  Need for dialysis services and related  services.   HISTORY:  This is a 50 year old African American female with a history  of end-stage renal disease, obesity, multiple vascular access failures,  chronic hypotension, diabetes mellitus type 2, history of SVC occlusion  with stenting, OSA, gastroparesis, and pulmonary embolism in 2006.  The  patient was admitted last night on April 16 for pleuritic chest pain and  shortness of breath of acute onset.  A VQ scan was done which showed  numerous moderate to large segmental perfusion defects.  The chest x-ray  was negative.  The patient was moved to ICU due to persistent hypoxemia  and shortness of breath and subsequently received intravenous Activase  due to the significant clot burden and persistent symptoms with  hypoxemia.  She received the Activase this morning.  She is having some  oozing from a right upper arm wound related to a recent vascular access  surgery done earlier this week.   The patient's usual dialysis is Tuesday, Thursday, Saturday at [facility].  She does have a history of MRSA catheter-  related blood infections in the past.  I have seen one in the computer  system in 2008, but I do not have the official records from her dialysis  unit currently.  We are awaiting further information from the dialysis  unit also regarding question of whether there are any contraindications  to Coumadin therapy recorded in the past.   PAST MEDICAL HISTORY:  1. ESRD.  2. Multiple access failures.  Most recently,  the patient had a HeRO      catheter procedure about 2 months ago.  The access clotted and was      declotted by Interventional Radiology on March 30.  It reclotted on      April 6 and apparently there was an attempt to declot the access      earlier this week on the 14th which was unsuccessful and a PermCath      was placed in the right groin.  Also, notable for history of SVC      occlusion with stenting in the past.  3. OSA and significant obesity.  4. Chronic hypotension.  5. Diabetes type 2.  6. Gastroparesis due to diabetes.  7. History of MRSA catheter-related bacteremia episodes.  8. History of PE in 2006, treated with Coumadin until 2007.   HOME MEDICATIONS:  1. ProAmatine 10 t.i.d.  2. Actos 5 once a day.  3. Mobic 7.5 once a day.  4. Multivitamin.  5. Zocor 40 once a day.  6. Aspirin 81 mg once a day.  7. Prilosec 20 mg daily.  8. Albuterol MDI p.r.n.  9. Lantus and NovoLog with meals.   MEDICATIONS HERE:  1. Activase 100 mg IV.  2. Given one time Lovenox 130 mg subcu.  3. Given one time ProAmatine 10 t.i.d.  4. Albuterol nebs.  5. Atrovent nebs q.6 h.  6. Protonix 40 mg a day.  7. Benadryl 50 mg daily.  8. Reglan 5 q.i.d.  9. Senokot one at night.  10.PhosLo 4 with meals.  11.Lantus plus sliding scale insulin.  12.Multivitamin.   ALLERGIES:  PORK HEPARIN, this appears to be mild allergy.  The history  or the documents from the dialysis unit state that she is okay with  Benadryl in relation to this PORK HEPARIN allergy.  She has an allergy  to INTRAVENOUS CONTRAST MATERIAL causing shortness of breath and allergy  to PENICILLIN causing anaphylaxis and an allergy to FORTAZ causing  nausea and vomiting.   REVIEW OF SYSTEMS:  Denies fevers, chills, sweats, weight loss.  She  does endorse pleuritic chest pain and shortness of breath.  Currently,  no productive cough, no sore throat, difficulty swallowing, abdominal  pain, nausea, vomiting, diarrhea.  She makes  small amounts of urine.  Denies any leg swelling, joint pain, or back pain.  Denies any foot  ulcers or focal neurologic complaints.   PHYSICAL EXAMINATION:  VITAL SIGNS:  The patient's temperature 98.3,  heart rate 120, blood pressure 85/50, respirations 20-30, O2 sat is 97%  on 100 % nonrebreather mask.  GENERAL:  This is an obese female, appears not in severe distress.  She  is tachypneic, however.  She is alert and responds appropriately.  SKIN:  Warm and dry without rash or edema.  HEENT:  PERRL, EOMI.  Throat is moist.  NECK:  Supple.  No JVD is noted.  CHEST:  Clear anteriorly and posteriorly.  No wheezing or rhonchi.  CARDIAC:  Regular, tachycardic.  I did not hear a gallop or extra  sounds.  ABDOMEN:  Soft, obese, nontender.  EXTREMITIES:  There is a right upper arm suture site, which is oozing  blood at minimal rate.  This was the site that was operated on earlier  this week in an attempt to salvage the graft in right upper arm.  There  is trace edema in the ankles.  No significant excess edema.  NEUROLOGIC:  Alert and oriented x3.  No focal deficits.   LABORATORY DATA:  White blood count 13,000, hemoglobin 8, platelets 200.  Creatinine 8.1, potassium 4.2, albumin 3.3.  Liver enzymes normal.  Troponin-I is 0.08.  Chest x-ray negative.   IMPRESSION:  1. End-stage renal disease, for dialysis, Tuesday, Thursday, and      Saturday.  2. Chronic hypotension on midodrine.  3. Acute pulmonary embolus, multiple segmental defects, status post      tissue plasminogen activator (TPA).  4. Series allergies to both IV CONTRAST and PENICILLIN.  5. Apparently a milder allergy to PORK HEPARIN.  6. Diabetes mellitus type 2.  7. Multiple access failures with recent failure of HeRO catheter,      right upper arm.  Using PermCath in the right groin currently.  8. History of methicillin-resistant Staphylococcus aureus catheter-      related sepsis in the past.  9. Anemia of chronic  disease.  10.Secondary hyperparathyroidism.   PLAN:  Dialysis today.  No heparin if possible since TPA was given and  will dialyze without removing fluid also.      Sol Blazing, M.D.  Electronically  Signed     RDS/MEDQ  D:  03/26/2009  T:  03/27/2009  Job:  [id number]

## 2011-04-24 NOTE — H&P (Signed)
NAMEDIERRA, [name]               ACCOUNT NO.:  [id number]   MEDICAL RECORD NO.:  [id number]          PATIENT TYPE:  INP   LOCATION:  1826                         FACILITY:  [facility]   PHYSICIAN:  Blima Rich, MD      DATE OF BIRTH:  1961-10-19   DATE OF ADMISSION:  04/15/2009  DATE OF DISCHARGE:                              HISTORY & PHYSICAL   CHIEF COMPLAINT:  Worsening shortness of breath x2 days.   HISTORY OF PRESENT ILLNESS:  [name] is a 50 year old morbidly obese  African American female.  She has a significant history for diabetes  mellitus type 2, hypertension, and PE x2 with the last PE in April of  2010 at which time she required thrombolytics due to becoming  hemodynamically unstable.  She is also on hemodialysis and has a history  of end-stage renal disease with a Tuesday/Thursday/Saturday schedule and  is treated by [facility].   The patient presents with the above chief complaint of shortness of  breath.  Apparently she claims that she had been doing well since  discharge, but over the past 2 days is becoming increasingly more short  of breath.  A chest x-ray does not demonstrate any acute cardiopulmonary  disease.  She is afebrile and does not have any leukocytosis.  She  denies cough or expectoration.  There is no significant history of  tobacco use in the past either.  She has not had any sick contacts or  any recent travel history.  She denies fevers at home, denies headaches,  denies tinnitus, no blurring of vision.  No syncope, presyncope or  lightheadedness.  Denies odynophagia, denies dysphagia.  Does complain  of mild chest pressure discomfort today during the shortness of breath  episode.  There was no radiation, no nausea and vomiting, no  palpitations.  There is no history of PND or orthopnea.  Chest  pain/pressure resolved spontaneously and has not been an issue preceding  this.  She denies any abdominal pain, denies any diarrhea,  constipation,  dysuria or polyuria.  She does make a small amount of urine.  No  hematuria, bright red blood per rectum or melenic stools.  She has no  overt musculoskeletal complaints at this time.  She does follow her  dialysis schedule regularly and sees Dr. Lorrene [name].  Of note, she has two  indwelling catheters, one in the right groin through which she  apparently gets dialyzed and claims that has been in since January 27 as  well as one in the right upper extremity which she claims in  nonfunctioning and has also been placed since January 27.  This is  questionable.  According to the last discharge summary she had a  catheter tip infection which was subsequently treated with vancomycin  for 14 days and according to that note the catheter was removed.  She  claims that the catheter that was infected is the one in her right  groin.  It was never removed and they treated through it.  It does  appear from the history that she has difficulty with vascular access  and  this could possibly be the case.  At this time her INR is 1.9 and she  claims that she is compliant with the Coumadin.  She takes 2.5 mg p.o.  daily.   PAST MEDICAL/SURGICAL HISTORY:  1. End-stage renal disease Tuesday/Thursday/Saturday schedule for      hemodialysis.  2. Hypertension.  3. Diabetes mellitus type 2.  4. Dyslipidemia.  5. History of PE x2, the second episode in April of 2010 requiring      thrombolytics for hemodynamic instability.  6. History of morbid obesity.  7. History of OSA.  8. History of vascular access and multiple indwelling catheters.   ALLERGIES:  IVP DYE, AMOXICILLIN, FORTAZ, HEPARIN though heparin she can  take with the use of Benadryl.   FAMILY HISTORY:  Mother had a history of stroke and diabetes mellitus  type 2.  Father is unknown.   SOCIAL HISTORY:  Denies tobacco, illicits or alcohol.   HOME MEDICATIONS:  1. Coumadin 2.5 mg p.o. daily.  2. PhosLo 667 mg four tablets t.i.d. with  meals.  3. Lantus 30 units subcutaneous nightly.  4. Iron 325 mg daily.  5. Rena-Vite one tablet daily.  6. Reglan 5 mg daily.  7. Midodrine 10 mg daily.  8. NovoLog 12 units subcutaneous with meals.  9. Benadryl 50 mg daily.   REVIEW OF SYSTEMS:  Essentially 14-point review of systems performed and  pertinent positives and negatives as described above.   PHYSICAL EXAMINATION:  VITAL SIGNS:  At time of presentation temperature  97.8, heart rate 94, respiratory rate 16, blood pressure 112/62, O2  saturation 96% on room air.  The patient ambulated in the hallway and O2  saturation dropped to 85%.  GENERAL:  Morbidly obese African American female lying in bed with  nonrebreather on in no apparent distress.  HEENT:  Normocephalic and atraumatic.  Moist oral mucosa.  No thrush,  erythema or postnasal drip.  Eyes anicteric.  Extraocular muscles are  intact.  Pupils equal, round, and reactive to light and accommodation.  Conjunctivae is pale.  Sclerae anicteric.  NECK:  Supple.  Good range of motion.  No carotid bruits.  No  thyromegaly.  CARDIOVASCULAR:  S1 and S2 normal.  Regular rate and rhythm.  Positive  systolic murmur of TR.  LUNGS:  Air entry is bilaterally equal.  No rales, rhonchi or wheezes  appreciated.  ABDOMEN:  Soft, nontender and nondistended.  Positive bowel sounds,  obese.  EXTREMITIES:  No cyanosis or clubbing, but trace bilateral lower  extremity edema.  Positive bilateral dorsalis pedis.  CNS:  Alert and oriented x3.  Cranial nerves II-XII grossly intact.  Power, sensation and reflexes are bilaterally symmetrical.  SKIN:  No breakdown, swelling, ulceration or masses.  HEME/ONC:  No palpable lymphadenopathy, ecchymosis, bruising, or  petechiae.   LABORATORY DATA:  Chest x-ray reviewed by myself demonstrates no acute  cardiopulmonary disease.  EKG reviewed by myself demonstrates normal  sinus rhythm with low voltage, believed to be secondary to morbid  obesity.   Sodium 137, potassium 3.5, chloride 98, carbon dioxide 26,  glucose 141, BUN 9, creatinine 6.9, calcium 8.3.  PTT 47, INR 1.9, PT  22.6.  CK-MB less than 1, troponin less than 0.05, myoglobin 308.  WBC  6800, hemoglobin 10.9, hematocrit 32.8, platelet count 165, polymorphs  71%.   ASSESSMENT/PLAN:  1. Shortness of breath.  The etiology of this is uncertain.  Multiple      causes could be occurring.  a.     Could be infection.  By baseline chest x-ray, this is       unlikely.  She does not appear volume depleted and has been       compliant with her dosing.  We will repeat a chest x-ray PA and       lateral in the morning and rule out.  She does not have cough, she       does have fever and has no leukocytosis.  No need for antibiotics       at this time.      b.     Other causes could be and more concerning is recurrent       pulmonary embolism.  We will check an ultrasound of bilateral       lower extremities to rule out deep venous thrombosis.  If deep       venous thrombosis is positive, I believe that we can presume that       has a recurrent pulmonary embolism and I believe she will warrant       and IVC filter.      c.     Also concerning is that she has indwelling catheters that       are old especially in the right upper extremity.  We will check a       two-dimensional echocardiogram to rule out any intracardiac       thrombus or endocarditis.  Again the likelihood of this is less       likely based on the fact that she is afebrile and leukocytosis,       but the workup is warranted.  In case all workup mentioned above       is negative, we will need to evaluate her lungs.  Multiple options       are there including a normal CT scan without contrast versus a V/Q       scan versus a CTA.  The patient already has end-stage renal       disease and on hemodialysis.  If nephrology approves, a CTA would       be the best viable option to rule out pulmonary embolism as well        as to check pulmonary issues as well.  2. Diabetes mellitus.  Continue all home medications.  Check      hemoglobin A1C and fasting lipid profile.  3. End-stage renal disease on hemodialysis Tuesday/Thursday/Saturday      schedule.  We will inform Dr. Lorrene [name] and [facility] of the      patient's admission and hopefully we will resume her dialysis      schedule.  4. Gastrointestinal and deep venous thrombosis prophylaxis.  She does      not need deep venous thrombosis prophylaxis secondary to a      therapeutic INR and we will start Protonix 40 mg p.o. daily.      Blima Rich, MD  Electronically Signed     SP/MEDQ  D:  04/15/2009  T:  04/16/2009  Job:  [id number]

## 2011-04-24 NOTE — H&P (Signed)
[name], EDGINGTON               ACCOUNT NO.:  [id number]   MEDICAL RECORD NO.:  [id number]          PATIENT TYPE:  INP   LOCATION:  2112                         FACILITY:  [facility]   PHYSICIAN:  Bea Laura, MDDATE OF BIRTH:  10-25-1961   DATE OF ADMISSION:  03/25/2009  DATE OF DISCHARGE:                              HISTORY & PHYSICAL   This is a 50 year old with complicated past medical history.  She has a  history of end-stage renal disease, on Tuesdays, Thursdays, and  Saturdays.  She is also morbidly obese.  History of PE, history of  multiple occluded graft, diabetes, MRSA infection in the past of her  Perm-a-Cath.  Comes in for chest pain of acute onset lasting about 1  hour.  She was sitting when this happened.  She relates no radiation, no  pleuritic chest pain, nothing makes it better, walking makes it worse.  She relates some palpitation with this and sweating.  She relates that  when she got to the ED, putting her oxygen on, helps with chest pain.  She has also been complaining of dyspnea on exertion.  She cannot walk  now more than 50 feet, but before she could walk 1 or 2 blocks without  getting short of breath.  She relates no PND.  No long trips and no new  medications.  She has had no oral contraceptive pills.  She relates no  fever, chills, nausea, vomiting, or diarrhea.   ALLERGIES:  She is allergic to CONTRAST DYE she gets difficulty  breathing and PENICILLIN she also gets difficulty breathing.   PAST MEDICAL HISTORY:  End-stage renal disease with HD on Tuesdays,  Thursdays, and Saturdays, diabetes type 2 insulin dependent, MRSA of  previous Perm-a-Cath, obstructive sleep apnea, does not use a machine at  home.  She has history of hypotension, anemia of chronic disease,  history of PE, morbidly obese, history of hypothyroidism, and secondary  hypoparathyroidism.   MEDICATIONS:  1. PhosLo 4 tablets p.o. t.i.d.  2. Lantus 35 units subcutaneously  daily.  3. NovoLog 12 units t.i.d. with meal.  4. Iron tablets p.o. daily.  5. Multivitamins daily.  6. Albuterol p.r.n.  7. Actos 5 mg p.o. daily.  8. Reglan 5 mg p.o. daily.  9. Midodrine 10 mg p.o. t.i.d.   FAMILY HISTORY:  Her father died when she was young in a motor vehicle  accident.  Her mother died of a stroke at the age of 57.  Brothers, no  clotting disorder, but one of her brother is currently on dialysis.   SOCIAL HISTORY:  She denies smoking, alcohol.  She lives in [place],  here with her husband and also denies recreational drugs.   REVIEW OF SYSTEMS:  GENERAL:  As per HPI.  HEENT:  Dry mucous membrane.  No erythema or plaque on her tonsils.  CARDIOVASCULAR:  As per HPI.  LUNGS:  No coughing of blood.  ABDOMEN:  No diarrhea.  No constipation.  EXTREMITIES:  No edema, redness.  NEURO:  No blurry vision.  No double  vision.  No dizziness.   PHYSICAL EXAMINATION:  VITAL SIGNS:  Her temperature is 98.2, heart rate  of 120, blood pressure 97/34, respiratory rate 12.  She is sating 88% on  room air.  GENERAL:  She is a morbidly obese female lying in bed, complaining of  weakness.  NECK:  No JVD or bruits.  CARDIOVASCULAR:  Wide distant heart sounds.  No murmurs, rubs, or  gallops and positive S1 and S2.  LUNGS:  She has good air movement to auscultation and clear bilaterally.  ABDOMEN:  She has positive bowel sounds.  Nontender, nondistended, and  soft.  EXTREMITIES:  She has positive pulses.  No edema.  There was no leg  swelling.  No leg asymmetry.  No redness on her lower extremities.  She  has multiple scars on her left arm from her recent surgery to open up  her graft.  She has multiple scars on her right arm from her recent  surgery she had on Wednesday.  There is no drainage.  It is not warm to  touch.  There is tenderness to palpation. There is open scar, which  appears to be clean, no erythema, and no pus.  It is tender to  palpation, but no erythema or  swelling.  NEURO:  She is alert and oriented x3.  Coherent and fluent language.  III through XII are grossly intact.  Sensation is intact throughout.  Muscle strength is 5/5 in all 4 extremities.  Finger-to-nose is normal.  DTR 2+.   LABORATORY DATA:  PT is 14.8, INR is 1.1.  FOBT is negative x1.  Chest x-  ray shows no acute cardiopulmonary disease and there is right side  central venous catheter in place as well as a stent in the superior vena  cava.  Her BNP is 313.  Her first point of cardiac care markers was  negative x1.  Her D-dimer was 2.56.  Her sodium was 135, potassium 3.1,  chloride 98, bicarb of 27, BUN of 24, creatinine of 5.4, glucose of 225.  White count is 12.5 with an AST of 10.5, hemoglobin of 8.5 with a MCV of  86.7, platelets of 222.   ASSESSMENT AND PLAN:  1. Hypotension.  Recent surgery on her right arm. But with no fevers      at home, no signs of infection in her arm.  We will check blood      cultures x2.  She does have increasing white count and an increase      in her diff, so we will not start her on empiric coverage, on      antibiotic as of right now.  If she spikes fever overnight, we will      start on antibiotics and repeat her blood cultures.   I am concerned that she has a history of pulmonary embolism with  positive D-dimer, sudden onset of shortness of breath, and chest pain,  hypoxia on admission, tachycardic, hypotension.  So, we will get a V/Q  scan stat, and we will start her on therapeutic Lovenox for pulmonary  embolism, and the fact that also her hypoxia improved slightly with  oxygen.  It makes me more concerned about pulmonary embolism.  We will  check an ABG.  We will check cardiac enzymes x3.  We will admit her to  step-down unit.  We will check her EKG in the morning.  We will continue  her albuterol and Atrovent treatments.  We will type and screen 4 units  with FOBT, all stools.  The fact that she does not have tearing and   unbearable chest pain, it makes the aortic dissection unlikely, and the  fact that she does not have any widening on her chest x-ray, makes it  unlikely.  Pericarditis seems unlike.  She has no rub, and her BUN is  less than 60.  Gastroesophageal reflux disease is also unlikely.  It is  not made worse by food orally and flat bowel.  We will start her on  Protonix empirically.  Pneumothorax and pneumonia are not seen on chest  x-ray.  1. Anemia.  We will FOBT all her stools.  We will type and cross 4      units.  We will check an anemia panel.  There is no obvious site of      bleeding.  She does have a tender right arm, but no gross deformity      which can explain this drop in hemoglobin from 11 to 8.5 around the      surgical site.  So, we are going to repeat CBC in the morning.  We      will transfuse if she develops any symptoms.  2. Hypokalemia.  The patient on hemodialysis on Thursday.  She does      not relay to have a decreased p.o. intake.  She does have a      persistent QT which is seen on previous EKGs.  So, we will only      give 20 mEq of potassium and we will check a BMET in the morning.      We will also check a mag.  3. For end-stage renal disease, dialysis Tuesday, Thursdays, and      Saturdays.  We will call [facility] for hemodialysis.  We will check      electrolytes, and if needed, we will transfuse during dialysis.  4. Diabetes type 2.  The patient does not have an appetite right now,      so we will decrease her Lantus to 20.  We will start on sensitive      scale and sliding scale with CBG q.6 h. and meal coverage.  5. Secondary hypoparathyroidism.  We will continue her PhosLo.  We      will check mag, calcium, and phos.  We      will continue her renal diet.  6. History of hypothyroidism.  We will check TSH, and if low, we will      start her on treatment.   Time dedicated to the patient was 50 minutes.      Charlynne Cousins, M.D.  Electronically  Signed      Bea Laura, MD  Electronically Signed    AF/MEDQ  D:  03/25/2009  T:  03/26/2009  Job:  [id number]   cc:   Judeth Cornfield. Scot Dock, M.D.  Alma Rolla Flatten., M.D.

## 2011-04-24 NOTE — Assessment & Plan Note (Signed)
OFFICE VISIT   [name], [name]  DOB:  1961-11-06                                       08/28/2010  [id number]   The patient comes back today for evaluation of new access.  The patient  is at the end of the line for her access.  She currently dialyzes  through a right-sided femoral catheter.  She has had multiple lower  extremity catheters which have gotten infected.  She has a history of  multiple upper extremity grafts and has had a superior vena cava stent  placed.  She has also had bilateral femoral grafts which have each been  revised.  I am being asked to evaluate her for potential access options.   PHYSICAL EXAMINATION:  She is afebrile, respirations 20, heart rate is  80, blood pressure 106/73, temperature is 97.8.  She is obese.  Respirations are nonlabored. She has no open wounds in her groins.  She  has a right femoral catheter placed.   After speaking with Dr. Marval Regal, I agree that the patient is end-  stage as far as needing new access.  Her life span is certainly short by  the lack of access sites.  I am scheduling her for a bilateral upper  extremity venogram to evaluate her central venous system to see if she  is a candidate for possibly a necklace graft.  If she is not a candidate  for this due to central venous occlusion, we can consider redoing a  groin graft; however, she would certainly be at high risk for infection,  given her body habitus and history of multiple accesses in each groin.  Unfortunately, if we are unable to find access for her, she certainly  will not survive much longer.     Holly Abrahams, MD  Electronically Signed   VWB/MEDQ  D:  08/28/2010  T:  08/29/2010  Job:  [id number]   cc:   Donato Heinz, M.D.

## 2011-04-24 NOTE — Procedures (Signed)
VASCULAR LAB EXAM   INDICATION:  AV placement left lower extremity.  This is a dialysis  patient.   HISTORY:  Diabetes:  Yes  Cardiac:  No  Hypertension:  No   EXAM:  Left lower extremity venous mapping of the lower extremity veins.   IMPRESSION:  The patient has a patent common femoral vein, greater  saphenous veins.  There appears to be a graft adjacent to the common  femoral vein that is occluded.  I discussed the findings with V. Annamarie Major IV, MD.   ___________________________________________  V. Leia Alf, MD   OD/MEDQ  D:  09/19/2010  T:  09/19/2010  Job:  [id number]

## 2011-04-24 NOTE — Assessment & Plan Note (Signed)
OFFICE VISIT   YVONDA, ARGENTIERI  DOB:  [birth year]                                       09/18/2010  [id number]   Patient comes back in today for discussions of access.  She has  exhausted all options for a new access, thus I had sent her for an upper  extremity venogram to see if we could attempt a redo upper arm  procedure, even a necklace graft.  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  She  has also had multiple grafts and catheters in both groins.  Unfortunately, she is near the end of her access life and is having  recurrent infections.  She comes back in today to discuss possibly  proceeding with a redo leg graft.   I spent approximately 45 minutes with the patient going over all of her  options.  We have decided to proceed with a left thigh graft.  I  extensively reviewed the risk of infection with her.  I did perform an  ultrasound in the office which shows a patent common femoral vein above  the previous deployed graft.  She also has normal ABIs on the left.  Despite being high risk, I think this is our best option for extending  her dialysis-dependent life.  I am planning on doing this Friday,  October 21.     Eldridge Abrahams, MD  Electronically Signed   VWB/MEDQ  D:  09/18/2010  T:  09/19/2010  Job:  [id number]   cc:   Dr. Marval Regal

## 2011-04-24 NOTE — Op Note (Signed)
[name], [name]               ACCOUNT NO.:  [id number]   MEDICAL RECORD NO.:  [id number]          PATIENT TYPE:  AMB   LOCATION:  SDS                          FACILITY:  [facility]   PHYSICIAN:  Dorothea Glassman, M.D.    DATE OF BIRTH:  Mar 06, 1961   DATE OF PROCEDURE:  06/08/2009  DATE OF DISCHARGE:  06/08/2009                               OPERATIVE REPORT   SURGEON:  Dorothea Glassman, MD   ASSISTANT:  Nurse.   ANESTHESIA:  Local with MAC.   PREOPERATIVE DIAGNOSES:  1. End-stage renal failure.  2. Poorly functioning right femoral Diatek catheter.   POSTOPERATIVE DIAGNOSES:  1. End-stage renal failure.  2. Poorly functioning right femoral Diatek catheter.   PROCEDURE:  Removal of right femoral Diatek catheter, insertion of right  femoral Palindrome catheter.   OPERATIVE PROCEDURE:  The patient was brought to the operating room in  stable condition.  Placed supine position.  Right leg prepped and draped  in a sterile fashion.   Fluoroscopy was used to image throughout the procedure.   Skin and subcutaneous tissue were instilled with 1% Xylocaine, right  thigh over the tunnel of the right femoral Diatek catheter.  Transverse  skin incision made and followed down to the tunnel of the right femoral  Diatek catheter.  Catheter was grasped and controlled with a hemostat,  divided, and the distal portion catheter was removed.   The proximal portion of the catheter was then wired with a 0.035  guidewire under fluoroscopy.  This was removed.  A 16-French dilator and  tearaway sheath were advanced over the guidewire and the dilator  removed.   A 55 cm Palindrome catheter was then placed over the guidewire and  advanced into the inferior vena cava under fluoroscopy and advanced up  to the right atrial inferior caval junction.   The guidewire was then removed.  The sheath was flushed with saline  solution and capped with heparin.   The catheter was fixed to skin with interrupted 2-0  silk suture.  The  exposure incision was closed with interrupted 3-0 nylon suture.  Sterile  dressings were applied.   No apparent complications.  The patient was transferred to the recovery  room in stable condition.      Dorothea Glassman, M.D.  Electronically Signed     Dorothea Glassman, M.D.  Electronically Signed    PGH/MEDQ  D:  06/08/2009  T:  06/09/2009  Job:  [id number]

## 2011-04-24 NOTE — Assessment & Plan Note (Signed)
OFFICE VISIT   [name], [name]  DOB:  1961/08/15                                       02/14/2009  [id number]   REASON FOR VISIT:  Follow up wound.   HISTORY:  This is a 50 year old female who had a HeRO catheter placed on  January 29th.  She had a protracted hospital stay secondary to  hypotension.  She developed a wound separation.  She saw Dr. Oneida Alar on  February 24th.  He placed her on antibiotics.  She comes back in today  for follow-up.  The wound at her arterial incision site is healing, it  is very superficial, there is no evidence of infection.  There is an  audible bruit within her HeRO catheter, albeit faint.  I think this  should be able to be ready for access within the next 2 weeks.   Holly Abrahams, MD  Electronically Signed   VWB/MEDQ  D:  02/14/2009  T:  02/15/2009  Job:  [id number]   cc:   Donato Heinz, M.D.

## 2011-04-24 NOTE — Op Note (Signed)
[name], [name]               ACCOUNT NO.:  [id number]   MEDICAL RECORD NO.:  [id number]          PATIENT TYPE:  INP   LOCATION:  6710                         FACILITY:  [facility]   PHYSICIAN:  Jessy Oto. Fields, MD  DATE OF BIRTH:  03/04/1961   DATE OF PROCEDURE:  04/22/2009  DATE OF DISCHARGE:                               OPERATIVE REPORT   PROCEDURE:  Removal of venous limb of HERO catheter.   PREOPERATIVE DIAGNOSIS:  Central vein thrombosis with pulmonary embolus.   POSTOPERATIVE DIAGNOSIS:  Central vein thrombosis with pulmonary  embolus.   ANESTHESIA:  Local with IV sedation.   SURGEON:  Jessy Oto. Fields, MD   ASSISTANT:  Nurse.   OPERATIVE DETAILS:  After obtaining informed consent, the patient was  taken to the operating room.  The patient was placed in supine position  on the operating table.  After adequate sedation, the patient's entire  right upper chest and right upper extremities prepped and draped in the  usual sterile fashion.  Local anesthesia was infiltrated at a  preexisting transverse scar near the infraclavicular space.  Incision  was reopened, carried down through the subcutaneous tissues down to the  level of preexisting heterograft.  The connection port was dissected  free circumferentially and elevated up in the operative field.  The  arterial portion of the graft was clamped proximally.  The graft was  then transected and the venous limb removed completely.  Hemostasis was  obtained after holding direct pressure for approximately 15 minutes.  The arterial limb of the graft was then oversewn using a running 5-0  Prolene suture.  This was then unclamped and hemostasis was obtained.  The wound was thoroughly irrigated with normal saline solution.  Subcutaneous tissues reapproximated using running 3-0 Vicryl suture.  Skin was closed with a 4-0 Vicryl subcuticular stitch.  The patient  tolerated the procedure well, and there were no complications.  Instrument, sponge, and needle counts were correct at the end of the  case.  The patient was taken to the recovery room in stable condition.      Jessy Oto. Fields, MD  Electronically Signed     CEF/MEDQ  D:  04/22/2009  T:  04/23/2009  Job:  [id number]

## 2011-04-24 NOTE — Assessment & Plan Note (Signed)
OFFICE VISIT   KYMM, SOCK  DOB:  05-28-1961                                       01/03/2009  [id number]   REASON FOR VISIT:  Evaluate for dialysis access.   HISTORY:  This is a his a 73-year female seen at the request of Dr.  Marval Regal for evaluation and placement of a HeRO catheter.  Patient has  undergoing multiple accesses in the past, all of which have failed.  She  is currently dialyzing through a right-sided catheter.  She has also had  numerous catheter placed.  She is morbidly obese.   PHYSICAL EXAMINATION:  Her blood pressure 191/64 pulse is 90,  temperature is 98.  GENERAL:  She is well appearing in no distress.  She is obese.  She has  multiple incisions and palpable grafts which have all thrombosed in both  upper arms.  She has multiple graft removal sites in her neck.   ASSESSMENT/PLAN:  End-stage renal disease in need of access.   PLAN:  The patient will be scheduled for a HeRO catheter to be on this  Friday.  She has a current catheter placed in the right side.  I will  try to leave that in place and attempt at placing a left-sided HeRO.  If  I am unable to gain access into her central venous system she may  require using her current right-sided catheter for the HeRO access and  placement of groin catheter.  The patient was describe all this.  Again,  she is scheduled for this Friday, which is January 29, for a HeRO  catheter.   Eldridge Abrahams, MD  Electronically Signed   VWB/MEDQ  D:  01/03/2009  T:  01/04/2009  Job:  [id number]   cc:   Donato Heinz, M.D.

## 2011-04-24 NOTE — Op Note (Signed)
NAMEDAJONNA, ETO               ACCOUNT NO.:  [id number]   MEDICAL RECORD NO.:  [id number]           PATIENT TYPE:   LOCATION:                                 FACILITY:   PHYSICIAN:  Nelda Severe. Kellie Simmering, M.D.       DATE OF BIRTH:   DATE OF PROCEDURE:  DATE OF DISCHARGE:                               OPERATIVE REPORT   PREOPERATIVE DIAGNOSIS:  Nonfunctioning right upper arm HeRO  arteriovenous graft device.   POSTOPERATIVE DIAGNOSIS:  Nonfunctioning right upper arm HeRO  arteriovenous graft device.   OPERATIONS:  1. Replacement of venous outflow component of HeRO dialysis access      device via right internal jugular vein.  2. Thrombectomy of HeRO AV graft from brachial artery.  3. Exploration of arterial anastomosis of graft to brachial artery      with direct thrombectomy.   SURGEON:  Nelda Severe. Kellie Simmering, MD   FIRST ASSISTANT:  Jacinta Shoe, PA   ANESTHESIA:  General endotracheal.   PROCEDURE:  The patient was taken to the operating room, placed in the  supine position at which time satisfactory general endotracheal  anesthesia was administered.  Right upper extremity and right shoulder  and upper chest were prepped with Betadine scrub and solution and draped  in routine sterile manner.  The venous outflow component had migrated  proximally on a few occasions up into the stented superior vena cava, so  initially the plan was to replace this component to a more distal site  near the inferior vena cava atrial junction.  The graft had been  thrombolysed in the radiology department earlier in the day.  Longitudinal incision was made with a graft connected to the Silastic  catheter near the deltopectoral groove and the graft catheter junction  was exposed.  A second incision was made in the supraclavicular area  where the Silastic catheter dove to enter the internal jugular vein.  The graft/catheter junction was disconnected and the catheter delivered  through the  supraclavicular wound.  Amplatz guide wire passed into the  inferior vena cava under fluoroscopic guidance.  The catheter was  removed over the Amplatz and a new 20-mm sheath was inserted.  The new  Silastic catheter venous outflow component was then passed over the  guidewire with the stylet into the proximal inferior vena cava.  The  guide wire and stylet removed.  This was then tunneled into the  secondary wound flushed with saline.  No heparin was used because of an  allergy.  Attention turned to the graft component, which was declamped  and had very sluggish inflow despite thrombolysis earlier in the day.  A  Fogarty catheter was passed proximally down the graft would not traverse  the arterial anastomosis and could not obtain adequate inflow.  Therefore, a third incision was made in the distal upper arm where the  graft had been anastomosed to brachial artery, graft was dissected free  down to the brachial artery anastomosis.  Transverse opening made in the  graft.  Graft was filled with an organized thrombus at this point.  This  was removed under direct vision and flushed with saline and the arterial  anastomosis explored and was patent with fairly good inflow being  reestablished but no identifiable thrombus was noted.  Graftotomy was  reclosed with continuous 6-0 Prolene sutures.  Clamp released and there  was much improved flow out of the graft.  It was then  reconnected to the new venous outflow component.  Adequate hemostasis  was achieved and all wounds closed in layers with Vicryl in subcuticular  fashion.  Sterile dressings applied.  The patient was taken to recovery  room in satisfactory condition.      Nelda Severe Kellie Simmering, M.D.  Electronically Signed     JDL/MEDQ  D:  03/23/2009  T:  03/24/2009  Job:  [id number]

## 2011-04-24 NOTE — Assessment & Plan Note (Signed)
OFFICE VISIT   [name], [name]  DOB:  1961-04-23                                       11/06/2010  RE:[id number]   [name] comes back in today.  She underwent redo left thigh graft on  November 4.  She comes in today for a wound check.  Her wound is  completely healed.  There is no evidence infection.  There is no open  incision.  There is a faintly palpable pulse within her graft and it is  very easy to auscultate.  I think she can plan on using this in 1 week.  If she dialyzes appropriately through it, we will get her catheter out.     Eldridge Abrahams, MD  Electronically Signed   VWB/MEDQ  D:  11/06/2010  T:  11/06/2010  Job:  [id number]   cc:   Donato Heinz, M.D.

## 2011-04-24 NOTE — Op Note (Signed)
NAME:  [name], [name]               ACCOUNT NO.:  [id number]   MEDICAL RECORD NO.:  [id number]          PATIENT TYPE:  OIB   LOCATION:  [id number]                         FACILITY:  [facility]   PHYSICIAN:  Theotis Burrow IV, MDDATE OF BIRTH:  December 09, 1961   DATE OF PROCEDURE:  01/07/2009  DATE OF DISCHARGE:                               OPERATIVE REPORT   PREOPERATIVE DIAGNOSIS:  End-stage renal disease.   POSTOPERATIVE DIAGNOSIS:  End-stage renal disease.   PROCEDURE PERFORMED:  1. Ultrasound access of right common femoral vein.  2. Diatek catheter, right groin.  3. HeRO catheter.  4. Venogram.  5. Removal of right internal jugular Diatek.   PROCEDURE:  The patient was identified in the holding area and taken to  room #9.  She was placed supine on the table.  A time-out was called.  Antibiotic is given.  The left chest initially was prepped.  I evaluated  the left internal jugular vein with ultrasound.  It was found to be  patent.  It was accessed under ultrasound guidance with an 18-gauge  needle.  I was able to advance an 0.035 wire; essentially, however,  could not get into the right atrium.  She does have stents which were  visualized to preclude access on the left.  For that reason, I elected  to place a groin Diatek catheter.  After reprepping the right groin, the  right femoral vein was evaluated and found to be widely patent and  easily compressible, was accessed with an 18-gauge needle. An 0.035 wire  was advanced into the vena cava under fluoroscopic visualization.  The  tract was dilated.  Peel-away sheath was placed.  Over the wire, a 55-cm  Diatek catheter was placed into the inferior vena cava.  Peel-away  sheath was removed.  Site was selected for the skin exit site.  An 11  blade was used make an incision.  A subcutaneous tunnel was created and  dilated.  Catheter was pulled through the subcutaneous tunnel.  The cuff  was placed at the skin exit site.  Both ports were  flushed and aspirated  without difficulty.  Fluoroscopy was used to confirm that there were no  kinks within the catheter and that was in good position.  The catheter  was sewn in place with 3-0 nylon.  The skin incision was closed with 4-0  Vicryl.  Sterile dressings were applied.   At this point, we set up for the HeRO catheter.  The patient's  previously existing catheter was to be used.  An incision in the neck  was made over the catheter.  The catheter was dissected free from this  incision and a hemostat was placed around it.  Next, the cuff was  mobilized and then the graft was transected distal to the cuff.  The  external portion of the catheter was discarded.  A wire was advanced  through the catheter and the catheter was then removed..  Sequential  dilators were used to dilate the tract.  I then attempted to place the  peel-away sheath from the HeRO catheter kit.  This, however, would not  advance through the patient stents which were previously deployed in her  superior vena cava and her innominate vein.  I tried to dilate these  using sequential dilators.  An 18-French dilator would pass without  difficulty.  However, the 20-French peel-away sheath would not advance.  I then selected the short peel-away sheath.  I was able to get it into  the vein.  Over the wire, I then passed the catheter portion of the HeRO  device into the right atrium.  The introducer was removed as was the  peel-away sheath.  A site was selected in the deltopectoral groove for a  counterincision.  Subcutaneous tunnel was created and the catheter was  brought through the tunnel.  It was then clamped.  There was good  backbleeding.   A transverse incision was then made just above the antecubital crease.  The brachial artery was identified through this incision.  The artery  was found to be somewhat small measuring approximately 2.5-3 mm.  The  artery was mobilized proximally and distally.  I then created  a  subcutaneous tunnel connecting the 2 incisions using a short straight  tunneler.  The graft portion of the catheter was then brought through  the tunnel.  I then connected the graft and the catheter together and  the connection was then placed under the skin.  Next, the patient was  given systemic heparinization.  After heparin circulated, arteriotomy  was made with an #11 blade, which was extended with Potts scissors.  The  graft was then cut to the appropriate length to fit to the arteriotomy.  A running anastomosis with 6-0 Prolene was performed.  Prior to  completion of the anastomosis, the artery was flushed in the antegrade  and retrograde fashion.  Anastomosis was then secured.  Clamps were  released.  There was excellent flow within the graft.  The patient had a  good Doppler signal in the radial and ulnar artery.  Protamine 50 mg was  then administered.  Fluoroscopy again was used to confirm that the  catheter tip was in good position and there were no kinks within the  graft in the catheter.  At this point, I set up to close.  The incision  in the neck was closed with 4-0 Vicryl as was the deltopectoral groove  incision.  The incision in the arm was closed with a 3-0 Vicryl and a 4-  0 Vicryl.  The patient was successfully extubated and taken to the  recovery room in stable condition.           ______________________________  V. Leia Alf, MD  Electronically Signed     VWB/MEDQ  D:  01/07/2009  T:  01/08/2009  Job:  [id number]   cc:   Donato Heinz, M.D.  Windy Kalata, M.D.

## 2011-04-24 NOTE — Discharge Summary (Signed)
NAMESHARIDA, [name]               ACCOUNT NO.:  [id number]   MEDICAL RECORD NO.:  [id number]          PATIENT TYPE:  INP   LOCATION:  [id number]                         FACILITY:  [facility]   PHYSICIAN:  Rexene Alberts, M.D.    DATE OF BIRTH:  01-Apr-1961   DATE OF ADMISSION:  04/15/2009  DATE OF DISCHARGE:                               DISCHARGE SUMMARY   INTERIM DISCHARGE SUMMARY:   DATE OF DISCHARGE:  To be determined.   DISCHARGE DIAGNOSES:  1. 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 Oneida Alar.  2. Acute hypoxic respiratory failure secondary to #1, treated with      titrating oxygen.  3. Large vegetation on femoral dialysis catheter but not on the      tricuspid valve or any other valve per transesophageal      echocardiogram on Apr 25, 2009.  (The tricuspid valve vegetation      reported on the two-dimensional echocardiogram was not confirmed      with the transesophageal echocardiogram).  The femoral dialysis      catheter will be removed on Apr 26, 2009.  Blood cultures from Apr 15, 2009 and Apr 18, 2009 have been negative.  4. History of previous methicillin-resistant Staphylococcus aureus      catheter tip infection in April of 2010, status post treatment with      2 weeks of intravenous vancomycin therapy.  5. End-stage renal disease.  6. Chronic hypotension with systolic blood pressures ranging from 80-      110.  The patient is treated with Midodrine.  7. Insulin-requiring diabetes mellitus with several episodes of      hypoglycemia.  8. Anemia of end-stage renal disease.  9. Thrombocytopenia with a platelet count ranging from 125-130.  10.Obesity with obstructive sleep apnea, treated chronically with CPAP      nightly.   DISCHARGE MEDICATIONS:  To be dictated at the time of hospital  discharge.   CONSULTATIONS:  1. [facility], Sol Blazing, M.D. and Maudie Flakes.      Hassell Done, M.D.  Sherwood Fields, MD.  [facility] including Minna Merritts, MD.   PROCEDURE PERFORMED:  1. TEE performed on Apr 25, 2009 by Dr. Rockey Situ.  The results revealed      large vegetation, mobile, noted on dialysis catheter tip.      Vegetation appears to be greater than 1 cm.  Tricuspid valve and      all other valves  with no notable vegetation.  Normal LV size and      function.  2. Planned removal of femoral Diatek catheter on Apr 26, 2009 by Dr.      Oneida Alar.  3. Removal of venous limb of HeRO catheter by Dr. Oneida Alar on Apr 22, 2009.  4. 2-D echocardiogram on Apr 18, 2009.  The results revealed an      ejection fraction estimated to be  60-65%.  Apparent mobile      vegetation on the tricuspid valve.  Right ventricular systolic      function was mildly reduced and the cavity size was moderately      dilated.  Moderate tricuspid valve regurgitation.  Pulmonary artery      systolic pressure was severely increased measuring 65 mmHg.  5. Chest x-ray on Apr 22, 2009.  The results revealed right IJ      approach venous catheter removed.  6. Bilateral lower extremity Doppler study on Apr 16, 2009.  The      results revealed no evidence of DVT.  7. VQ scan on Apr 17, 2009.  The results revealed high probability of      pulmonary emboli with new areas of perfusion deficit when compared      to the ventilation-perfusion lung scan of March 26, 2009.  8. Hemodialysis.   HISTORY OF PRESENT ILLNESS:  The patient is a 50 year old woman with a  past medical history significant for obstructive sleep apnea, recurrent  PE, end-stage renal disease, and diabetes mellitus.  She presented to  the emergency department on Apr 15, 2009 with a chief complaint of  worsening shortness of breath.  When she was evaluated in the emergency  department, she was noted to be relatively hypoxic.  An ABG was ordered  and revealed a pH of 7.45, pCO2 of 34.4, and pO2 of 52.   Cardiac markers  were ordered and were essentially negative.  Her chest x-ray revealed no  active disease.  Her INR was virtually therapeutic at 1.9.  She was  admitted for further evaluation and management.   For additional details, please see the dictated history and physical.   HOSPITAL COURSE:  1. RECURRENT PE ASSOCIATED WITH HeRO CATHETER.  The patient has a      history of two prior pulmonary emboli with the second episode in      April of 2010 requiring thrombolytics for hemodynamic instability.      When she was initially evaluated during this hospitalization, she      was apparently hemodynamically stable though hypoxic.  She was      started on oxygen therapy, titrated to keep her oxygen saturations      greater than 90%.  She was therapeutic with regards to her INR.      However, there was a concern about a recurrent pulmonary embolism.      Therefore a VQ scan was ordered and it did reveal findings of new      areas of perfusion deficits when compared with the prior scan.      Venous Doppler study was ordered and it revealed no evidence of      lower extremity DVT bilaterally.  IVC filter placement was      contemplated.  However, it was felt that the HeRO catheter may have      been associated with the patient's recurrent pulmonary emboli.      Therefore, vascular surgeon, Dr. Oneida Alar, was consulted.  He removed      the catheter on Apr 22, 2009.  The patient was maintained on      anticoagulation throughout the hospitalization.  Coumadin had to be      held because of the procedures.  She was therefore anticoagulated      with intravenous heparin during the majority of the      hospitalization.  Coumadin has not been restarted yet as the  femoral Diatek catheter will be removed tomorrow.  As of today, the      patient is oxygenating 98% on room air.  Of note, she does have a      history of obstructive sleep apnea and she is being maintained on      CPAP therapy at  bedtime.  2. HEMODIALYSIS CATHETER TIP VEGETATION.  Because of the patient's      history of PE and obstructive sleep apnea associated with hypoxia      and hypotension during the hospitalization, a 2-D echocardiogram      was ordered.  The results of the 2-D echocardiogram were remarkable      for preserved LV function, moderate tricuspid valve regurgitation,      an apparent mobile vegetation on the tricuspid valve, and severe      pulmonary hypertension.  When it was noted that there was a      possible vegetation on the tricuspid valve, the patient was started      on vancomycin and Rocephin.  The patient did have a history of a      MRSA catheter tip infection during the previous hospitalization in      April for which she received 2 weeks of intravenous vancomycin.      Blood cultures were ordered on Apr 15, 2009 and Apr 18, 2009 for      evaluation.  Both sets of blood cultures have remained negative.      [facility] was consulted for      evaluation of what was seen on the thoracic echocardiogram.  Dr.      Rockey Situ performed a TEE today and the preliminary results were      significant for a large vegetation on the tip of the hemodialysis      catheter, but there was no apparent evidence of vegetations on the      tricuspid valve or any of the other valves.  Given this finding,      the femoral Diatek catheter will be removed tomorrow on Apr 26, 2009 by Dr. Oneida Alar.  The patient is being maintained on vancomycin      and Rocephin for now.  An Infectious Diseases consultation will be      obtained tomorrow for further recommendations with regards to      antibiotic management and/or course.  3. CHRONIC HYPOTENSION.  The patient's blood pressure at times fell      into the upper 70s to low [id number] systolically.  The patient was      surprisingly asymptomatic.  She is treated chronically with      Midodrine.  Initially this medication was not started.   However,      eventually it was restarted at her usual t.i.d. home dosing.  She      was treated several times with boluses of IV fluids.  However, this      was not recommended by the Nephrology team.  Cortisol level was      assessed and it was within normal limits at 8.6.  She will continue      to be treated with Midodrine and monitored closely.  4. END-STAGE RENAL DISEASE.  The patient is being managed by the      Nephrology team from [facility].  The patient has      undergone several sessions of hemodialysis during the      hospitalization.  5. INSULIN REQUIRING DIABETES MELLITUS.  The patient's capillary blood      glucose was initially controlled.  However, she experienced several      episodes of her capillary blood glucose falling into the 40s and      50s.  Therefore, the sliding scale NovoLog as well as Lantus were      completely discontinued during the hospital course.  Over the past      several days, her capillary blood glucose has been well controlled      off of all insulin.  Her hemoglobin A1c was found to be 6.4.  Her      TSH was within normal limits at 1.127.  6. ANEMIA OF END-STAGE RENAL DISEASE.  The patient is being treated      with iron supplementation and Aranesp as managed by the Nephrology      team.  Today, her hemoglobin is 10.3, her hematocrit is 31.5, and      her MCV is 89.1.  7. THROMBOCYTOPENIA.  The patient's platelet count was mildly low      ranging from 125-130.  However, today, her platelet count is 177.      She is being maintained on heparin therapy  for treatment of PE      given that her platelet count has remained relatively stable or      improved.   DISCHARGE DISPOSITION:  The patient is not quite ready for hospital  discharge yet.  The plan is for Dr. Oneida Alar to remove the femoral Diatek  catheter tomorrow on Apr 26, 2009.  An Infectious Diseases consultation  will be obtained to assist with the decision to continue  antibiotic  therapy versus discontinuing it.      Rexene Alberts, M.D.  Electronically Signed     DF/MEDQ  D:  04/25/2009  T:  04/25/2009  Job:  [id number]   cc:   Elzie Rings. Lorrene Reid, M.D.  Barbette Merino, M.D.  Jessy Oto. Oneida Alar, MD

## 2011-04-24 NOTE — Consult Note (Signed)
NAMEABISHAI, POSS               ACCOUNT NO.:  [id number]   MEDICAL RECORD NO.:  [id number]          PATIENT TYPE:  INP   LOCATION:  6729                         FACILITY:  [facility]   PHYSICIAN:  Sol Blazing, M.D.DATE OF BIRTH:  01/08/1961   DATE OF CONSULTATION:  04/16/2009  DATE OF DISCHARGE:                                 CONSULTATION   REASON FOR CONSULTATION:  Hemodialysis.   HISTORY OF PRESENT ILLNESS:  This is a 50 year old female, morbidly  obese African American female admitted for shortness of breath with a  history of PE, obstructive sleep apnea, end-stage renal disease on  hemodialysis, Tuesday, Thursday, Saturday, diabetes type 2,  hyperlipidemia, due for dialysis today.  Shortness of breath is without  clear etiology at this point.  There is concern for recurrent PE.  The  patient currently subtherapeutic on Coumadin.   PHYSICAL EXAMINATION:  Temperature is 97.4, pulse 95, blood pressure  85/56, respiratory rate 20, 96% of 3 liters.  Chest x-ray shows no acute  cardiopulmonary process.  Sodium was 139, potassium 3.5, chloride 100,  bicarb 24, BUN 11, creatinine 7.47, glucose 115, calcium 8.1, albumin  3.6, INR of 1.9.  BMP of 269, phos of 2.1, mag of 2.  LFTs within normal  limits.   ASSESSMENT AND PLAN:  This is a 50 year old female admitted for dyspnea  with end-stage renal disease on hemodialysis.  1. End-stage renal disease.  We will plan for hemodialysis today and      place back on her Tuesday, Thursday, and Saturday schedule, try to      take off 2-3 liters today.  She does not seem morbidly extensively      volume overloaded on exam or on chest x-ray.  2. Dyspnea, this is likely multifactorial.  Definite concerns for      recurrent PE given her clinical appearance and degree of previous      PE, the patient was subtherapeutic upon admission.  Coumadin is      being dosed per pharmacy.  She has an extensive workup in process      per primary, also  concern for likely pulmonary hypertension with      right heart failure from the previous echo seen in April 2010      likely related to her body habitus.  3. Diabetes.  She is on sliding scale insulin per primary.  4. Obstructive sleep apnea, on CPAP.  5. Fluids, electrolytes, nutrition, she should be on a renal diet.   DISPOSITION:  As per primary.      Dion Body, MD  Electronically Signed      Sol Blazing, M.D.  Electronically Signed    KL/MEDQ  D:  04/16/2009  T:  04/17/2009  Job:  [id number]

## 2011-04-24 NOTE — Op Note (Signed)
NAMEPHILLIS, [name]               ACCOUNT NO.:  [id number]   MEDICAL RECORD NO.:  [id number]          PATIENT TYPE:  INP   LOCATION:  6710                         FACILITY:  [facility]   PHYSICIAN:  Rosetta Posner, M.D.    DATE OF BIRTH:  1961-08-02   DATE OF PROCEDURE:  04/26/2009  DATE OF DISCHARGE:                               OPERATIVE REPORT   PREOPERATIVE DIAGNOSIS:  End-stage renal disease with vegetation on  right femoral Diatek catheter.   POSTOPERATIVE DIAGNOSIS:  End-stage renal disease with vegetation on  right femoral Diatek catheter.   PROCEDURE:  Removal of right femoral Diatek catheter and placement of  new right femoral Diatek catheter.   SURGEON:  Rosetta Posner, MD   ASSISTANT:  Nurse.   ANESTHESIA:  MAC.   COMPLICATIONS:  None.   DISPOSITION:  To recovery room with chest x-ray pending.   INDICATION FOR PROCEDURE:  [name] is a very complex 50 year old  black female with multiple prior vascular accesses.  She has a right  femoral Diatek catheter that has been since January 2010.  She has had  some episodes of infection and underwent a TEE showing vegetations on  the tip of her catheter.  It was recommended that she have this catheter  removed and placement of a new catheter.  She is running out of access  options.  I discussed this with [name], explained the risk of  embolus with removal of catheter, but nonetheless feel this is important  for removal of infection sores.  There was no evidence of infection at  the femoral site itself.   PROCEDURE IN DETAIL:  The patient was taken to the operating room,  placed in supine position, where the area of the right groin was prepped  and draped in usual sterile fashion.  Using local anesthesia, incision  was made at the groin over the catheter, carried down to graft the  catheter proximal to the cuff with Hemostats.  Catheter was divided and  the distal portion of the catheter was removed in its entirety.   A  guidewire was passed through the existing catheter and the old catheter  was removed.  A dilator and peel-away sheath was passed over the  guidewire and the dilator was removed.  The 25-cm Diatek catheter was  passed over the guidewire and through the peel-away sheath.  The peel-  away sheath was removed and the catheter was positioned to the level of  the inferior vena cava at the right atrial junction.  The catheter was  brought through a subcutaneous tunnel through a separate stab incision.  The 2-lumen ports were attached and both lumens flushed and aspirated  easily and were locked with 1000 units per mL  heparin.  The catheter was secured to the skin with 3-0 nylon stitch and  the entry site was closed with 4-0 subcuticular Vicryl stitch.  Sterile  dressing was applied and the patient was taken to the recovery room in a  stable condition.      Rosetta Posner, M.D.  Electronically Signed  TFE/MEDQ  D:  04/26/2009  T:  04/27/2009  Job:  [id number]

## 2011-04-24 NOTE — Discharge Summary (Signed)
[name], WALKUP               ACCOUNT NO.:  [id number]   MEDICAL RECORD NO.:  [id number]          PATIENT TYPE:  INP   LOCATION:  6743                         FACILITY:  [facility]   PHYSICIAN:  Leana Gamer, MDDATE OF BIRTH:  11/12/1961   DATE OF ADMISSION:  03/25/2009  DATE OF DISCHARGE:  03/31/2009                               DISCHARGE SUMMARY   DISCHARGE DIAGNOSES:  1. Pulmonary embolus status post tPA.  2. Diabetes type 2.  3. Iron deficiency plus chronic anemia.  4. End-stage renal disease on hemodialysis.  5. Staphylococcus aureus bacteremia secondary to infected catheter      tip.  6. Chronic hypertension on midodrine.  7. Pulmonary hypertension.  8. Hemodynamic instability.  9. Hypotension secondary to pulmonary embolus, now resolved.   DISCHARGE MEDICATIONS:  1. Coumadin 2.5 mg p.o. q.h.s. x2 days then patient to have INR drawn      on Saturday at dialysis and adjustment to Coumadin made by Dr.      Lorrene Reid who has been managing her Coumadin prior to this      hospitalization.  2. PhosLo 667 four tablets p.o. t.i.d. with meals.  3. Lantus 30 units subcu h.s.  4. Iron 1 tablet p.o. daily.  5. Rena-Vite 1 tablet p.o. daily.  6. Reglan 5 mg p.o. daily.  7. Midodrine 10 mg p.o. daily.  8. NovoLog 12 units subcu with each meal 3 times a day.  9. Benadryl 50 mg p.o. daily.  10.Vancomycin 1250 mg IV after each dialysis until Apr 12, 2009.      (Please note, sensitivities to culture still pending.  Dr. Lorrene Reid,      et. al., to follow up with sensitivities and adjust antibiotics as      necessary).   CONSULTANTS:  1. Dr. Lorrene Reid, et. al., Nephrology.  2. Dr. Halford Chessman, Pulmonary Critical Care.   PROCEDURES:  1. Alteplase infusion secondary to acute pulmonary embolus in a      hemodynamically unstable patient with a large clot burden.  2. Hemodialysis scheduled Tuesday, Thursday and Saturday.   DIAGNOSTIC STUDIES:  1. Two-view chest x-ray done on admission which  shows no active      cardiopulmonary disease.  2. VQ scan done April 17th which shows high probability for pulmonary      embolus.  3. Portable chest x-ray done on the 18th which shows minimal basilar      atelectasis.  4. A 2-D echocardiogram done on the 19th which shows left ventricular      systolic function normal with an ejection fraction of 60-65%, left      ventricular diastolic function parameters normal, moderate      regurgitation of the tricuspid valve.  Right ventricular systolic      pressure is increased consistent with moderate pulmonary      hypertension.   CODE STATUS:  Full code.   ALLERGIES:  1. PORCINE HEPARIN.  2. IODINE.  3. CONTRAST MEDIA.  4. AMOXICILLIN.   PRIMARY CARE PHYSICIAN:  The patient identified Dr. Barbette Merino as her  primary care physician and Dr. Lorrene Reid as  her nephrologist.   CHIEF COMPLAINT:  Acute onset chest pain.   HISTORY OF PRESENT ILLNESS:  Please refer to the H and P by Dr.  Lewie Chamber for details of the HPI.   HOSPITAL COURSE:  1. Chest pain.  The patient was admitted with chest pain.  Serial      enzymes were done to rule out a cardiac cause of her chest pain.      However, there was a high suspicion for pulmonary embolus, and the      appropriate investigations were carried out.  A VQ scan showed that      the patient had a high probability for pulmonary embolus.  The      patient then became hemodynamically unstable, requiring transfer to      the ICU.  The patient became hypotensive secondary to her embolic      burden and underwent IV alteplase for declotting of the pulmonary      embolus.  The patient improved clinically, and her chest pain      resolved.  The patient was then started on Lovenox and Coumadin for      titration of her anticoagulant state.  The patient has had 5 days      of overlap of Coumadin and Lovenox and 3 days of therapeutic      Coumadin, with an INR ranging between 2-3 while still on Lovenox.       The patient is being discharged home on the above dose of Lovenox.      Her INR today is 2.6.  She will follow up with an INR to be drawn      at hemodialysis on Saturday and then further titration of her      medications resultant of that INR.  2. Infected catheter tip.  The patient showed signs of infection.  Her      catheter was removed.  The tip was sent for culture which showed      staph aureus.  Sensitivities still are  not yet reported; however,      the patient has been treated with vancomycin and is to receive 2      weeks of total antibiotic coverage.  The sensitivities are still      pending, and the cultures are to be followed by nephrology and her      antibiotics can be adjusted based upon the sensitivities at that      time.  3. Diabetes type 2.  The patient had been on 35 units of Lantus prior      to coming to the hospital.  However, she became hypotensive with      that dose, and the dose of Lantus has been decreased at 30 units      subcu with 12 units of meal coverage.  The patient has been advised      to check her blood sugars fasting and before meals.   Otherwise, the patient has remained stable with her chronic issues.  The  patient is morbidly obese and has some component of obstructive sleep  apnea.  Echocardiogram showed findings consistent with pulmonary  hypertension which likely has a chronic component but is probably also  impacted upon by her acute pulmonary embolus leading to some degree of  pulmonary hypertension.   The patient will follow up with her primary care physician.  The patient  currently uses CPAP as an outpatient, and I will defer to her primary  care  physician as to whether or not that needs to be further titrated.   DIETARY RESTRICTIONS:  The patient should be on a diabetic renal diet.   PHYSICAL RESTRICTIONS:  Activity as tolerated.   FOLLOWUP:  The patient is to follow up with Dr. Jonelle Sidle in 1 week or as  needed, and she is to follow  up with hemodialysis on Saturday at which  time she should have her INR done and her Coumadin titrated.  Culture  tip sensitivities still are pending, and that should be followed up by  Dr. Lorrene Reid and colleagues and medication adjustments made accordingly.   Total time for this discharge was 32 minutes.      Leana Gamer, MD  Electronically Signed     MAM/MEDQ  D:  03/31/2009  T:  03/31/2009  Job:  [id number]   cc:   Elzie Rings. Lorrene Reid, M.D.  Barbette Merino, M.D.  Chesley Mires, MD

## 2011-04-24 NOTE — Discharge Summary (Signed)
[name], [name]               ACCOUNT NO.:  [id number]   MEDICAL RECORD NO.:  [id number]          PATIENT TYPE:  INP   LOCATION:  6711                         FACILITY:  [facility]   PHYSICIAN:  Theotis Burrow IV, MDDATE OF BIRTH:  08-27-1961   DATE OF ADMISSION:  01/09/2009  DATE OF DISCHARGE:  01/14/2009                               DISCHARGE SUMMARY   FINAL DISCHARGE DIAGNOSES:  1. End-stage renal disease, need for vascular access.  2. Vascular compromise following insertion of access requiring      dopamine infusion.  3. Diabetes.   CONDITION ON DISCHARGE:  Stable, improving.   DISCHARGE MEDICATIONS:  1. PhosLo 4 tablets p.o. t.i.d.  2. Lantus insulin 35 units subcu nightly.  3. Iron 1 tablet p.o. daily.  4. Multivitamin with iron 1 tablet p.o. daily.  5. Albuterol inhaler p.r.n.  6. Actos 5 mg p.o. daily.  7. Reglan 5 mg p.o. daily.  8. Midodrine 10 mg p.o. t.i.d.  9. NovoLog 12 units subcu t.i.d. with meals.   DISPOSITION:  She is being discharged home in stable condition with her  wounds healing well.  She is to follow up with her dialysis center.  She  is to resume her dialysis as scheduled.  She is also given a  prescription for Darvocet-N 50 one to two tablets p.o. q.6 h. p.r.n.  pain.   Brief identifying and statement of complete details, please refer the  typed history and physical.  Briefly, this very pleasant 50 year old  woman with end-stage renal disease has undergone all access placements.  Dr. Trula Slade evaluated her and found her to be a suitable candidate for  placement of HeRO catheter.  She was informed of the risks and benefits  of the procedure and after careful consideration elected to proceed with  surgery.   HOSPITAL COURSE:  Preoperative workup was completed as an outpatient.  She was brought in through same-day surgery and underwent the  aforementioned placement of HeRO catheter.  For complete details, please  refer the typed operative  report.  The procedure was without  complications.  She was returned to the Postanesthesia Care Unit.  In  the Postanesthesia Care Unit, she was hypertensive and necessitating the  need for dopamine.  She was supportive on dopamine for the next several  days.  We were eventually able to wean it and she remained  hemodynamically stable.  We were able to transfer to a bed on the Renal  Floor.  Following transfer to the Renal Floor, she was able to walk and  was mobilized.  On January 11, 2009, she was stable and was discharged  home.   PROCEDURE PERFORMED:  Placement of HeRO catheter on January 07, 2009, by  Dr. Trula Slade, complications none.      Chad Cordial, PA      V. Leia Alf, MD  Electronically Signed    KEL/MEDQ  D:  01/14/2009  T:  01/15/2009  Job:  [id number]

## 2011-04-24 NOTE — Discharge Summary (Signed)
[name], [name]               ACCOUNT NO.:  [id number]   MEDICAL RECORD NO.:  [id number]          PATIENT TYPE:  INP   LOCATION:  6704                         FACILITY:  [facility]   PHYSICIAN:  Sherryl Manges, M.D.  DATE OF BIRTH:  February 22, 1961   DATE OF ADMISSION:  04/15/2009  DATE OF DISCHARGE:                               DISCHARGE SUMMARY   ADDENDUM:   PRIMARY MEDICAL DOCTOR:  Dr. Barbette Merino.   PRIMARY NEPHROLOGIST:  Dr. Hassell Done.   PRIMARY VASCULAR SURGEON:  Dr. Annamarie Major and Dr. Oneida Alar.   DISCHARGE DIAGNOSES:  Refer to interim summary dictated Apr 25, 2009 by  Dr. Rexene Alberts.   For details of admission history, clinical course, consultations, and  procedures, refer to above-mentioned interim summary.  However, for the  period from Apr 26, 2009 to Apr 28, 2009, the following are pertinent:  The patient underwent removal of right femoral Diatek catheter on Apr 26, 2009 by Dr. Curt Jews.  For details, refer to operative note of Apr 26, 2009.  This was an uncomplicated procedure.  Clinically, there were  no new issues.  Diabetes mellitus remained adequately controlled and the  patient continued regular dialysis schedule.  She was transitioned from  intravenous Heparin infusion to subcutaneous scheduled once daily  Lovenox on Apr 27, 2009, and on Apr 28, 2009, INR was subtherapeutic at  1.5.  Following discussion with Dr. Orene Desanctis on Apr 26, 2009, antibiotics,  ie Rocephin and Vancomycin were discontinued, as transesophageal  echocardiogram of Apr 25, 2009 showed no evidence of endocarditis.   DISPOSITION:  The patient was, on Apr 28, 2009, asymptomatic, deemed  clinically stable for discharge, and was therefore, discharged  accordingly.   DIET:  Renal 60-2-2.   ACTIVITY:  As tolerated.   FOLLOWUP INSTRUCTIONS:  The patient is to follow up with Dr. Barbette Merino, primary M.D., within 1 to 2 weeks of discharge.  She has been  instructed to call for an appointment.  Is  to follow up with her primary  nephrologist, Dr. Salem Senate, per prior scheduled appointment, and with  Dr. Trula Slade per prior scheduled appointment.   DISCHARGE MEDICATIONS:  1. Nephro-Vite 1 p.o. daily.  2. Midodrine 10 mg p.o. t.i.d. (was on 10 mg p.o. daily).  3. Reglan 5 mg p.o. daily.  4. Lantus 30 units subcutaneous nightly.  5. NovoLog 12 units subcutaneous with meals.  6. Benadryl 50 mg p.o. daily.  7. Lovenox 120 mg subcutaneous at 6 p.m. daily until INR is between 2      to 3 for 48 hours, then stop.  8. Coumadin 5 mg p.o. 6 p.m. on Apr 29, 2009, then 2.5 mg p.o. at 6      p.m. daily from Apr 30, 2009.  Otherwise, per INR.   Note: Phoslo has been discontinued.   SPECIAL INSTRUCTIONS:  The patient is to have INR checked on Saturday,  Apr 30, 2009, and Tuesday May 03, 2009, at the time of hemodialysis.  Nephrologist will adjust Coumadin dosage accordingly.  She is to  continue routine dialysis at the [facility], per her usual  dialysis schedule.  Dr. Barbette Merino, the patient's primary medical  doctor will continue INR management and adjustment of Coumadin dosage.  All of this has been communicated to the patient.  She verbalized  understanding.      Sherryl Manges, M.D.  Electronically Signed     CO/MEDQ  D:  04/28/2009  T:  04/28/2009  Job:  [id number]   cc:   Barbette Merino, M.D.  Richard F. Hassell Done, M.D.  Eldridge Abrahams, MD  Jessy Oto. Oneida Alar, MD

## 2011-04-24 NOTE — Assessment & Plan Note (Signed)
OFFICE VISIT   [name], [name]  DOB:  [birth year]                                       02/02/2009  [id number]   The patient presents today for followup after placement of a HERO graft  by Dr. Trula Slade on 01/07/2009.  She has a small ulceration near the  arterial incision for her graft.   On exam there is no surrounding erythema.  There is no discharge.  There  is an audible bruit in the graft.  The ulcer is 1.5 x 1.5 cm in size.  It is very superficial in nature.   I prophylactically placed her on Cipro today and a 10 day course was  given.  She will follow up with Dr. Trula Slade on March 8.  I believe this  wound should heal spontaneously without any infection.   Jessy Oto. Fields, MD  Electronically Signed   CEF/MEDQ  D:  02/02/2009  T:  02/03/2009  Job:  [id number]

## 2011-04-27 NOTE — Consult Note (Signed)
NAMECARMEL, [name]               ACCOUNT NO.:  [id number]   MEDICAL RECORD NO.:  [id number]          PATIENT TYPE:  INP   LOCATION:  5124                         FACILITY:  [facility]   PHYSICIAN:  James L. Rolla Flatten., M.D.DATE OF BIRTH:  07-Jun-1961   DATE OF CONSULTATION:  09/10/2006  DATE OF DISCHARGE:                                   CONSULTATION   REASON FOR CONSULTATION:  Heme-positive stool.   HISTORY:  50 year old woman who was admitted to the hospital several days  ago with a fever who is being treated for possible pneumonia and sepsis.  Blood cultures are pending.  She is started on Zithromax and Rocephin.  She  was found to have heme-positive stool and her hemoglobin on admission was  7.4.  Protime and INR are markedly elevated, INR was 5.5 and is down to 4.6  today after some low dose vitamin K, her Coumadin is on hold.  She has not  seen any visible bleeding.  No dyspepsia, heartburn, or indigestion.  She  has never had endoscopy or colonoscopy.  She denies any abdominal pain.  She  denies any dyspepsia.   MEDICATIONS ON ADMISSION:  Calcium, Coumadin, insulin, Nephrovite, Actos,  Zocor, aspirin, Reglan.   ALLERGIES:  The patient is allergic to IODINE, CONTRAST DYE and HEPARIN,  although she apparently has been able to take Lovenox.   MEDICAL HISTORY:  1. End stage renal disease since 1991 hemodialysis due to hypertension.  2. Type 2 diabetes and chronic hypertension, hyperlipidemia.  3. Anemia of chronic disease on Aracept..  4. Secondary hyperparathyroidism with previous thyroidectomy.  5. History of MRSA in the distant past.  6. History of pulmonary embolism on chronic Coumadin.  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  7. Anemia   PAST SURGICAL HISTORY:  Relates primarily to vascular access.   FAMILY HISTORY:  Noncontributory.   SOCIAL HISTORY:  No drinking or smoking.  The  patient is married.  He has  chronic problems with vascular access but is able to get around reasonably  well.   PHYSICAL EXAMINATION:  GENERAL:  The patient is afebrile in acute distress, an obese African  American female.  HEART:  Regular rate and rhythm without murmurs or gallops.  LUNGS:  Clear.  ABDOMEN:  Soft, nondistended, nontender  RECTAL:  Stools were heme-positive in the hospital.  We did not repeat the  guaiacs.   ASSESSMENT:  Anemia probably due to minimal problem I agree with, at her age  with chronic anemia and heme-positive stools, that she would benefit from  endoscopic evaluation.   PLAN:  We will proceed with endoscopy and colonoscopy on Thursday.  I have  discussed this with the patient and Dr. Lorrene Reid.  We will check her INR in  the morning and possibly give her some FFP if her INR is still elevated.  We  will go ahead and prep her tomorrow.           ______________________________  James L. Rolla Flatten., M.D.     Jaynie Bream  D:  09/10/2006  T:  09/11/2006  Job:  [id number]   cc:   Caren Griffins B. Lorrene Reid, M.D.

## 2011-04-27 NOTE — H&P (Signed)
[name], [name]               ACCOUNT NO.:  [id number]   MEDICAL RECORD NO.:  [id number]          PATIENT TYPE:  INP   LOCATION:  [id number]                         FACILITY:  [facility]   PHYSICIAN:  Sherril Croon, M.D.   DATE OF BIRTH:  03-16-1961   DATE OF ADMISSION:  10/29/2005  DATE OF DISCHARGE:                                HISTORY & PHYSICAL   ATTENDING PHYSICIAN:  Sherril Croon, M.D.   HISTORY OF PRESENT ILLNESS:  [name] is a 50 year old African-American  lady with a past medical history significant for diabetes mellitus type 2,  end-stage renal disease on dialysis Tuesday, Thursday, Saturday at [facility], hypertension, morbid obesity, secondary  hyperparathyroidism status post surgery in 1994.  She presented to [facility] for shortness of breath sudden onset the morning of admission  at 2:30 to 3 a.m. while she was resting in bed.  The patient denies any  cough or chest pain.  She admits having some fever and chills with a maximum  temperature 101.  She admits nausea and vomiting that started the morning of  admission.  She had several episodes of vomiting of yellow clear secretions.  In the ER, the patient was found tachycardic with a heart rate of 140/150.  She received 10 mg of Cardizem IV that decreased the heart rate to 110.   ALLERGIES:  THE PATIENT IS ALLERGIC TO HEPARIN, IODINE, AND CONTRAST MEDIA.   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   FAMILY HISTORY:  Noncontributory.   SOCIAL HISTORY:  The patient denies alcohol, smoking, or drug use.   MEDICATIONS:  1.  PhosLo 667 mg times three with  each meal.  2.  Nephro-Vite one daily.  3.  Reglan 10 mg with meals and bedtime.  4.  Zocor 40 mg with supper.  5.  Actos 45 mg daily.  6.  Lantus 12 units at bedtime.  7.  Trusopt 2% one drop each eye t.i.d.  8.  Xalatan 0.005% eye drops one drop each eye daily.  9.  Midodrine 15 mg t.i.d. and before dialysis.  10. InFeD 50 mg IV every Tuesday with dialysis.  11. Levocarnitine 6.8 mg IV with dialysis.  12. EPO 8500 units IV with dialysis.   PHYSICAL EXAMINATION:  VITAL SIGNS:  Blood pressure of 130/57, pulse 146,  respiratory rate 30, O2 sat 90% and 84% on room air.  GENERAL:  A 50 year old African-American lady, morbidly obese, vomiting.  EYES:  PERRLA.  Extra ocular movements intact.  HENT:  Clear oropharynx.  NECK:  Very obese.  CARDIOVASCULAR:  Tachycardic, regular, no murmurs, no rubs.  RESPIRATORY:  Clear to auscultation.  ABDOMEN:  Bowel sounds positive, soft, lot of right lower quadrant  tenderness.  EXTREMITIES:  2+ edema.  NEUROLOGIC:  Nonfocal, psychologically oriented times three.  LABORATORY DATA AT ADMISSION:  Sodium 129, potassium 4.1, chloride 99,  bicarb 23, BUN 40, glucose 227, white blood cells 11.8 with an ANC of 11.  Hemoglobin 13.4 with an MCV of 89.3, hematocrit 41.8, platelets 218.  __________ showed a pH of 7.327, pCO2 40, pO2 113, bicarb 21.  Anion gap of  7, lactic acid 1.6, BNP 50.8, AA gradient calculated of 193.5.  Chest x-ray  showed mild congestion.  EKG showed sinus tachycardia, low voltage QRS.   PROBLEMS:  1.  Acute onset shortness of breath.  Even though the differential diagnosis      is very broad, chest x-ray ruled out pleurisy, pneumothorax, and most      likely pneumonia.  I am not sure if patient has a PE; given her history      of permanent catheter on the right groin, I highly suspect a pulmonary      embolism secondary to thrombus formation on the catheter site.  Also,      she is having some pus at the site of skin catheter  insertion that might      explain her fever and leukocytosis.  She is going to be admitted to a      step-down unit, and she will have a V/Q scan to rule out PE, and we will      check cardiac enzymes times three q.8h. and repeat EKGs.  2.  Fever, nausea and vomiting.  We will check amylase and lipase, and CMET.  3.  For leukocytosis, we will check blood cultures times two, and we will      start patient on antibiotics broad spectrum in case of bacteremia,      catheter origin.  4.  End-stage renal disease, will continue with scheduled dialysis.  5.  Secondary hyperparathyroidism.  We will check electrolytes and continue      PhosLo.  6.  Hypotension.  The patient will continue Midodrine.  7.  Hyperlipidemia.  The patient will continue Zocor.  8.  Diabetes mellitus.  She will continue Lantus 12 units and sliding scale      insulin.  9.  Anemia.  The patient will continue InFeD and EPO as prior until      hospitalization.      Carolin Guernsey, MD      Sherril Croon, M.D.  Electronically Signed    DA/MEDQ  D:  10/29/2005  T:  10/29/2005  Job:  [id number]

## 2011-04-27 NOTE — Op Note (Signed)
NAME:  [name], [name]                         ACCOUNT NO.:  [id number]   MEDICAL RECORD NO.:  [id number]                   PATIENT TYPE:  INP   LOCATION:  5599                                 FACILITY:  [place]   PHYSICIAN:  Judeth Cornfield. Scot Dock, M.D.        DATE OF BIRTH:  15-Oct-1961   DATE OF PROCEDURE:  03/23/2004  DATE OF DISCHARGE:  03/23/2004                                 OPERATIVE REPORT   PREOPERATIVE DIAGNOSIS:  Chronic renal failure.   POSTOPERATIVE DIAGNOSIS:  Chronic renal failure.   PROCEDURE:  Placement of right internal jugular Diatech catheter, ultrasound  of bilateral internal jugular veins.   SURGEON:  Judeth Cornfield. Scot Dock, M.D.   ANESTHESIA:  Local with sedation.   SURGICAL TECHNIQUE:  The patient was taken to the operating room and sedated  by anesthesia.  The ultrasound scanner was used to mark both internal  jugular veins.  The neck and upper chest were prepped and draped in the  usual sterile fashion.  The skin was anesthetized with 1% lidocaine.  With  the patient in Trendelenburg, the right internal jugular vein was cannulated  and a guide-wire introduced into the superior vena cava under fluoroscopic  control.  Of note, I had to use an angled glide wire to get down to the  superior vena cava.  The small dilator was then passed over the wire and  then the angled glide wire exchanged for the J-wire.  The tract was then  dilated and then the dilator and peel away sheath were passed over the wire  and the wire and dilator were removed.  The 28 catheter was passed through  the peel away sheath and positioned in the right atrium.  The exit site for  the catheter was selected and the skin anesthetized between the two areas.  The catheter was brought through the tunnel, cut to the appropriate length,  and the distal ports were attached.  Both ports withdrew easily and were  then flushed with heparinized saline and filled with concentrated heparin.  The  catheter was secured at its exit site with a 3-0 nylon suture.  The IJ  cannulation site was closed with a 4-0 subcuticular stitch.  A sterile  dressing was applied.  The patient tolerated the procedure well and was  transferred to the recovery room in satisfactory condition.  All needle and  sponge counts were correct.                                               Judeth Cornfield. Scot Dock, M.D.    CSD/MEDQ  D:  03/23/2004  T:  03/24/2004  Job:  [id number]

## 2011-04-27 NOTE — Op Note (Signed)
NAME:  [name], [name]                         ACCOUNT NO.:  [id number]   MEDICAL RECORD NO.:  [id number]                   PATIENT TYPE:  OIB   LOCATION:  5533                                 FACILITY:  [facility]   PHYSICIAN:  Judeth Cornfield. Scot Dock, M.D.        DATE OF BIRTH:  1961-03-03   DATE OF PROCEDURE:  05/19/2004  DATE OF DISCHARGE:                                 OPERATIVE REPORT   PREOPERATIVE DIAGNOSIS:  Chronic renal failure.   POSTOPERATIVE DIAGNOSIS:  Chronic renal failure.   PROCEDURE:  Placement of new right thigh arteriovenous graft.   SURGEON:  Judeth Cornfield. Scot Dock, M.D.   ASSISTANT:  Leta Baptist, P.A.-C.   ANESTHESIA:  General.   SURGICAL TECHNIQUE:  The patient was taken to the operating room and  received a general anesthetic.  We taped her pannus superiorly.  The right  groin was then prepped and draped in the usual sterile fashion.  I made an  oblique incision  below the inguinal crease.  I thought because of her body  habitus she would be a very high risk for wound infection and did not want  to make a longitudinal incision in the groin.  Through the incision, I was  able to dissect out the superficial femoral artery and by retracting further  proximally, able to get the common femoral and deep femoral arteries which  were controlled with vessel loops.  Through the same incision, the saphenous  vein was dissected free just distal to the saphenofemoral junction.  The  vein was ligated distally and spatulated proximally.  The patient had a 4-7  mm graft tunneled in a loop fashion in the thigh using one distal counter  incision.  The patient then received 0.2 mcg per kg of Refludan as the  patient had an allergy to heparin.  After adequate circulation time, the  common femoral, superficial femoral, and deep femoral arteries were  controlled and then a longitudinal arteriotomy was made in the common  femoral artery.  This was extended with Potts  scissors.  Then, a segment of  the 4 mm end of the graft was excised, the graft slightly spatulated and  sewn end-to-side to the common femoral artery using continuous 6-0 Prolene  suture.  The graft was then pulled to the appropriate length for anastomosis  to the saphenous vein.  The graft was spatulated and sewn end-to-end to the  vein using continuous 6-0 Prolene suture.  At the end of the case, there was  an excellent thrill in the fistula.  The wound was copiously irrigated with  saline and hemostasis was obtained.  The wound was closed with a deep layer  of 2-0 Vicryl, subcutaneous layer of 3-0 Vicryl, and the skin closed with 4-  0 Vicryl subcuticular stitch.  A sterile dressing was applied.  The patient  tolerated the procedure well and was transferred to the recovery room in  satisfactory condition.  All needle  and sponge counts were correct.                                               Judeth Cornfield. Scot Dock, M.D.    CSD/MEDQ  D:  05/19/2004  T:  05/19/2004  Job:  [id number]

## 2011-04-27 NOTE — Op Note (Signed)
[name], [name]               ACCOUNT NO.:  [id number]   MEDICAL RECORD NO.:  [id number]          PATIENT TYPE:  OIB   LOCATION:  5503                         FACILITY:  [place]   PHYSICIAN:  Nelda Severe. Kellie Simmering, M.D.  DATE OF BIRTH:  July 31, 1961   DATE OF PROCEDURE:  07/02/2005  DATE OF DISCHARGE:                                 OPERATIVE REPORT   PREOPERATIVE DIAGNOSIS:  End-stage renal disease, status post infected left  IJ Diatek catheter.   POSTOPERATIVE DIAGNOSIS:  End-stage renal disease, status post infected left  IJ Diatek catheter.   OPERATION:  Insertion of a Diatek catheter via right common femoral vein (55  cm).   SURGEON:  Nelda Severe. Kellie Simmering, M.D.   ASSISTANT:  Nurse.   ANESTHESIA:  General endotracheal.   PROCEDURE:  The patient was taken to the operating room, placed in supine  position; at which time satisfactory general endotracheal anesthesia was  administered. After prepping and draping in routine sterile manner, the  right common femoral vein was entered using a standard approach. Guidewire  passed into the inferior vena cava to the diaphragm under fluoroscopic  guidance. After dilating the tract appropriately, a 55 cm Diatek catheter  was passed through a peel-away sheath, positioned at the level of the  diaphragm into the right atrium, sheath removed and the catheter then  tunneled peripherally and secured with nylon sutures. The wound was closed  with Vicryl in a subcuticular fashion.  Sterile dressing applied. The  patient taken to recovery room in satisfactory condition.       JDL/MEDQ  D:  07/02/2005  T:  07/02/2005  Job:  [id number]

## 2011-04-27 NOTE — Op Note (Signed)
NAME:  [name], [name]                         ACCOUNT NO.:  [id number]   MEDICAL RECORD NO.:  [id number]                   PATIENT TYPE:  OIB   LOCATION:  5501                                 FACILITY:  [place]   PHYSICIAN:  Nelda Severe. Kellie Simmering, M.D.               DATE OF BIRTH:  Feb 22, 1961   DATE OF PROCEDURE:  01/26/2004  DATE OF DISCHARGE:                                 OPERATIVE REPORT   PREOPERATIVE DIAGNOSIS:  End stage renal disease.   POSTOPERATIVE DIAGNOSIS:  End stage renal disease.   OPERATION:  Insertion Diatech catheter right internal jugular vein and  bilateral ultrasound localization of internal jugular vein.   SURGEON:  Nelda Severe. Kellie Simmering, M.D.   FIRST ASSISTANT:  Nurse   ANESTHESIA:  Local.   PROCEDURE:  The patient was taken to the operating room and placed in  Trendelenburg position.  The upper chest and neck were exposed and the  internal jugular veins were visualized using D-mode ultrasound.  Both were  noted to be patent.  After prepping and draping in the routine sterile  manner, the right internal jugular vein was entered using a supraclavicular  approach.  Guide-wire was passed into the right atrium under fluoroscopic  guidance.  After dilating the tract appropriately, a 28 cm Diatech catheter  was passed through a peel away sheath, positioned in the right atrium, and  the sheath removed.  Both ports easily flushed with saline.  The catheter  was secured with nylon sutures and the wound closed with Vicryl in a  subcuticular fashion and appropriate heparin solution inserted in the  catheter ports.  The patient had been given Benadryl prior to this because  of a history of a heparin allergy which causes itching.  The location of the  catheter was confirmed fluoroscopically.  The patient was taken to the  recovery room in satisfactory condition for chest x-ray.                                               Nelda Severe Kellie Simmering, M.D.    JDL/MEDQ  D:  01/26/2004   T:  01/26/2004  Job:  [id number]

## 2011-04-27 NOTE — Consult Note (Signed)
NAMEGERTHA, HUTMACHER               ACCOUNT NO.:  [id number]   MEDICAL RECORD NO.:  [id number]          PATIENT TYPE:  OIB   LOCATION:  2106                         FACILITY:  [facility]   PHYSICIAN:  Donato Heinz, M.D.DATE OF BIRTH:  1961-10-06   DATE OF CONSULTATION:  01/11/2005  DATE OF DISCHARGE:                                   CONSULTATION   REASON FOR CONSULTATION:  The patient with end-stage renal disease admitted  with ventilator-dependent respiratory failure and questionable pneumonia.   HISTORY OF PRESENT ILLNESS:  [name] is a 50 year old, morbidly obese  African American female admitted for elective thrombectomy and revision of a  left upper arm arteriovenous graft that has been nonfunctional.  The  procedure resulted in an unsuccessful thrombectomy but intraoperative  placement of  left IJ Diatek 32 cm catheter.  Postoperative complications  included laryngeal spasm necessitating reintubation.  Post extubation had  increased work of breathing and significant respiratory distress unrelieved  by BiPAP machine and oxygen. She was found to have a bilateral pneumonia on  chest x-ray and, therefore, the physician elected to reintubate her for the  third time today for respiratory distress.  She is currently in the PACU and  with a propofol sedation.   REVIEW OF SYSTEMS:  Unobtainable.   PAST MEDICAL HISTORY:  1.  End-stage renal disease secondary to hypertension.  2.  Nephrosclerosis secondary to preeclampsia.  3.  Diabetes type 2, insulin dependent.  4.  Cerebral tumor cerebri.  5.  Obstructive sleep apnea.  6.  Hypertension with a 2-D echo in November 2005 was normal with a EF of 55-      65%.  7.  Hyperparathyroidism secondary to end-stage renal disease.  8.  History of multiple access and failures for hemodialysis access.  9.  Anemia.  10. Morbid obesity.   ALLERGIES:  1.  PORCINE HEPARIN, requires Benadryl prior to hemodialysis.  2.  IODINE.  3.   Questionable IV DYE.   OUTPATIENT MEDICATIONS:  This is per preoperative records.  Hemodialysis  records unavailable.  1.  Lidocaine 10 mg daily.  2.  Reglan 10 mg daily.  3.  Actos 45 mg daily.  4.  Lipitor 20 mg daily.  5.  PhosLo gelcaps four tabs q.a.c.  6.  Calcium acetate 667 mcg daily.   SOCIAL HISTORY:  Lives with her sister and denies history of tobacco or  alcohol abuse.  Unable to obtain family medical history.  Hemodialysis  occurs at [facility].   PHYSICAL EXAMINATION:  VITAL SIGNS:  Temperature 98.1, blood pressure 92/43,  pulse 97 with 97% O2 saturation on the ventilator with settings tidal volume  600, rate of 12, PEEP 5, and 50% FiO2.  GENERAL:  She is sedated and responds not meaningfully to pain.  Does not  appear in distress.  CARDIOVASCULAR:  Tachycardic to 112, regular rhythm.  __________ difficult  to auscultate secondary to body habitus and ventilator.  CHEST:  Bibasilar rhonchi and crackles.  ABDOMEN:  Morbidly obese.  Positive bowel sounds.  Soft, nontender,  nondistended.  EXTREMITIES:  Trace  edema and obtainable pulses.  Left upper extremity had a  visible AV graft without thrill.  Bilateral lower extremities had trace  edema.  No pulses.  NECK:  With left IJ catheter.   LABORATORY DATA:  CBC, BMET, ABG pending.  Chest x-ray with bibasilar  densities, likely atelectasis, question of pneumonia.   ASSESSMENT/PLAN:  A 50 year old African American female:  1.  Ventilator-dependent respiratory failure, admitted to CVTS service.      Continue supportive management in this patient with obstructive sleep      apnea and now intubation #3 for one day. Also continue propofol      sedation.  2.  Bibasilar lung densities.  Atelectasis versus pneumonia.  Will cover      with Avelox for community-acquired pneumonia and continue treatment of      atelectasis via vent settings.  3.  End-stage renal disease.  Will obtain details from [facility] in the morning.  Plan hemodialysis on February 3, via her      newly inserted Diatek catheter.  4.  Anemia.  Continue Epogen and iron supplements per hemodialysis orders.      Follow CBC, especially with possible epistaxis during surgical      intubation.  5.  Insulin-dependent diabetes mellitus.  Continue sliding scale insulin.      Unclear of her control.  Might check hemoglobin A1C.  Was on Lantus      insulin on previous admissions.  Not listed on current medications.      Will follow records and add if necessary.  6.  Hypertension. Currently hypotensive.  Obtaining fluid bolus per primary      team and IV fluids to follow.  7.  Arteriovenous graft access, status post a thrombotic event to her graft      and not resolved with current thrombectomy procedure today.  Does have      access for dialysis in the a.m. and further plans per CVTS.  8.  History of increased phosphates.  Continue binders.  Will recheck renal      panel in the a.m.      JS/MEDQ  D:  01/11/2005  T:  01/12/2005  Job:  [id number]

## 2011-04-27 NOTE — Discharge Summary (Signed)
NAMECYANA, [name]               ACCOUNT NO.:  [id number]   MEDICAL RECORD NO.:  [id number]          PATIENT TYPE:  INP   LOCATION:  2111                         FACILITY:  [facility]   PHYSICIAN:  Rosetta Posner, M.D.    DATE OF BIRTH:  02/21/1961   DATE OF ADMISSION:  10/09/2004  DATE OF DISCHARGE:  10/11/2004                                 DISCHARGE SUMMARY   HISTORY OF PRESENT ILLNESS:  This 50 year old female with end-stage renal  disease who was scheduled for outpatient placement of an AV Gore-Tex graft.  This procedure was done but she was hypotensive postoperatively and required  admission for further evaluation and treatment.   PAST MEDICAL HISTORY:  1.  End-stage renal disease.  2.  Diabetes mellitus type 2.  3.  History of MRSA infection from a dialysis catheter.  4.  Pseudotumor cerebri.  5.  Obstructive sleep apnea.  6.  Hypertension.  7.  Secondary hyperparathyroidism.   ALLERGIES:  1.  HEPARIN.  2.  IV DYE.  3.  IODINE.   MEDICATIONS ON ADMISSION:  1.  Actos 45 mg one p.o. q.a.m.  2.  PhosLo 667 mg four p.o. a.c. t.i.d.  3.  Nephro-Vite one p.o. daily.  4.  Reglan 10 mg p.o. q.a.c.  and h.s.  5.  Epogen 6000 units IV q. hemodialysis.  6.  Benadryl 25 mg p.o. on call hemodialysis.   For family history, social history, review of systems and physical  examination please see history and physical from previous recent  hospitalization.   HOSPITAL COURSE:  The patient was admitted with hypotension.  She was seen  in nephrology consultation.  She was placed on a dopamine drip.  She was  initially placed on a Neo-Synephrine drip.  Subsequently her blood pressure  did not change significantly and she was subsequently weaned from Neo-  Synephrine and placed on a Dopamine drip.  The patient showed good  improvement of her blood pressure, however, she did require a couple of days  to wean it off entirely.  Her graft did fine.  It maintained a palpable  thrill and bruit.   She was otherwise stable for discharge on October 11, 2004.   CONDITION ON DISCHARGE:  Stable and improved.   FINAL DIAGNOSES:  1.  Hypotension following placement of an AV Gore-Tex graft in the left      upper arm by Dr. Donnetta Hutching.  Resolved.  2.  Other diagnoses as per history.   DISCHARGE MEDICATIONS:  As preoperatively.  She was additionally given some  medication for pain.  Also Tylox one to two q.4-6h. as needed.   FOLLOW UP:  Dr. Donnetta Hutching on a p.r.n. basis.  Continued follow-up with renal  service as they require.   DISCHARGE INSTRUCTIONS:  The patient received written instructions in regard  to medications, activity, diet, wound care and follow-up.      Dian Situ   WEG/MEDQ  D:  11/28/2004  T:  11/29/2004  Job:  [id number]   cc:   Donato Heinz, M.D.  [address]  [place]  [place] [address]  Fax: [phone]

## 2011-04-27 NOTE — Discharge Summary (Signed)
NAMEMABEL, TOMARO               ACCOUNT NO.:  [id number]   MEDICAL RECORD NO.:  [id number]          PATIENT TYPE:  INP   LOCATION:  5509                         FACILITY:  [facility]   PHYSICIAN:  Maudie Flakes. Hassell Done, M.D.   DATE OF BIRTH:  04/06/1961   DATE OF ADMISSION:  11/27/2006  DATE OF DISCHARGE:  11/30/2006                               DISCHARGE SUMMARY   DISCHARGE DIAGNOSES:  1. Fever.  2. One out of two positive blood cultures for methicillin-resistant      Staphylococcus aureus.  3. Nausea, vomiting, presumed secondary to number two.  4. Chronic hypotension.  5. End-stage renal disease.  6. Type 2 diabetes mellitus.  7. History of pulmonary embolism with SBC occlusion, currently off      Coumadin.  8. Obstructive sleep apnea.  9. Morbid obesity.   HISTORY OF PRESENT ILLNESS:  [name] is a 50 year old black female  with end-stage renal disease who dialyzes Tuesday, Thursday, Saturday at  the [facility].  She was admitted by Dr. Hassell Done due to  chills and vomiting since the morning of the 19th.  She had dialysis the  day prior without problems.  In the emergency room her temperature is  99.9 with continued chills.  She is admitted for IV antibiotics and  further intervention as needed.   HOSPITAL COURSE:  The patient was admitted to the renal unit and placed  in a private room due to history of methicillin-resistant staph aureus  sepsis.  Her white count on admission was 13.3 with 88% neutrophils, 5%  lymphocytes, 4% monocytes.  Hemoglobin was 13, hematocrit 41.8,  platelets 289,000.  INR 1.9 with the patient not being on Coumadin.  Sodium 130, potassium 3.9, chloride is 93, CO2 24, glucose 117, BUN 22,  creatinine 8.6, calcium 8.2, albumin 2.9.  Liver function tests within  normal limits.  Phosphorus 3.3.  Blood cultures were drawn x2 and one  was positive for methicillin-resistant staph aureus.  Her fever  defervesced in the hospital.  She initially  was treated with vancomycin  and Fortaz until culture and sensitivities were back, at which time the  [place] was discontinued.  And that only one of the blood cultures was  positive, she will be discharged on a 2-week course of vancomycin with  surveillance blood cultures being drawn.  Her catheter was not changed  this admission due to her paucity of access sites.  She will have  surveillance blood cultures drawn.   There was no change in her usual medications during this  hospitalization.   CONDITION AT DISCHARGE:  Stable.   The patient was discharged to home.   DISCHARGE MEDICATIONS:  1. Omeprazole 20 mg at bedtime.  2. Baby aspirin 81 mg per day.  3. Zocor 40 mg per day.  4. Xalatan 0.005%, one drop both eyes daily.  5. Trusopt 2%, one drop both eyes daily.  6. Midodrine 10 mg three times a day.  7. Vicodin 5/500, one every 6 hours as needed.  8. Nephro-Vite 1 tablet daily  9. Insulin sliding scale as before.  10.PhosLo  667 mg, four with meals three times a day.   DISCHARGE DIET:  One hundred grams protein, 2 grams sodium, 2 grams  potassium diabetic diet.  Limit fluids to 1200 mL per day.   The patient is not to change her dialysis dressing nor take showers the  dialysis staff will change it on Tuesday, Thursdays, and Saturday.   DISCHARGE DIALYSIS ORDERS:  1. As usual, except estimated dry weight is 126 kg.  2. She is to be on vancomycin 1 gram IV through December 31.  3. She is to have followup surveillance blood cultures drawn.  4. There was no change in her right femoral dialysis catheter.      Alric Seton, P.A.    ______________________________  Maudie Flakes. Hassell Done, M.D.    MB/MEDQ  D:  01/06/2007  T:  01/06/2007  Job:  [id number]   cc:   [facility]

## 2011-04-27 NOTE — H&P (Signed)
[name], [name]               ACCOUNT NO.:  [id number]   MEDICAL RECORD NO.:  [id number]          PATIENT TYPE:  INP   LOCATION:  5124                         FACILITY:  [place]   PHYSICIAN:  Caren Griffins B. Lorrene Reid, M.D.DATE OF BIRTH:  [birth year]   DATE OF ADMISSION:  09/08/2006  DATE OF DISCHARGE:                                HISTORY & PHYSICAL   A 50 year old black female with endstage renal disease who dialyzes on  Tuesdays, Thursdays and Saturdays presented to the emergency department with  a history of chills, fever, generalized weakness and worsening exertional  dyspnea.  Patient has insulin-requiring diabetes, morbid obesity, chronic  hypotension, history of pulmonary embolus in the past for which she takes  Coumadin and dialysis access issues with her current dialysis access being a  left femoral hemodialysis catheter.  She had nausea, general feeling of  malaise and un-wellness and cough and chills for several days prior to  coming to the ED.  In the emergency room, she was noted to have a white  count of 13,800, hemoglobin low at 7.9, PT-INR of 5.5, and a chest x-ray  showing patchy airspace disease possibly consistent with pneumonia, and she  was admitted for further evaluation and treatment.  She denied drainage from  her hemodialysis catheter in the groin, denies dark stools, menorrhagia or  bright red rectal bleeding.   PAST MEDICAL HISTORY:  1. End-stage renal disease on Tuesday, Thursday, Saturday.  Hemodialysis      at the [facility], blood flow rate 450, dialysate      flow a 2.0, 270 minutes, 2K 2.5 calcium bath, dry weight estimated 135      kg with access being a left femoral vein catheter and receives EPO      4,000 units 3 times a week.  She receives Benadryl prior to dialysis      because of an allergy to Heparin.  2. Insulin-dependent diabetes mellitus.  3. Morbid obesity.  4. Secondary hyperparathyroidism.  5. Coronary artery  disease.  6. History of pulmonary embolus, on Coumadin.  7. Recent torn right shoulder ligament September 2007.  8. Obstructive sleep apnea on CPAP at home.   MEDICATIONS:  Per patient, her outpatient medicines were:  1. Actos 45 mg a day.  2. NovoLog 15 units t.i.d. a.c.  3. Lantus 45 units at bedtime.  4. Coumadin 5 mg on Tuesday, Thursday and Saturday; 2.5 on Monday,      Wednesday, Friday and Sunday.  5. Midodrine 10 mg t.i.d.  6. Reglan 5 mg q.i.d.  7. PhosLo 4 with meals.  8. Vicodin p.r.n.   ALLERGIES:  SHE HAS AN ALLERGY TO CONTRAST MATERIAL.   FAMILY HISTORY:  Noncontributory.   SOCIAL HISTORY:  Noncontributory.   REVIEW OF SYSTEMS:  Positive for fatigue, nausea, chills, lethargy, dyspnea  which has been chronic but worse recently with exertion.  She denies bright  red rectal bleeding, dark stools, bleeding from her femoral catheter.  She  has had right shoulder pain from her recently recognized torn ligament.   PHYSICAL EXAMINATION:  GENERAL:  Morbidly obese  black female.  VITAL SIGNS:  Temperature 100.6, O2 SAT 99% on 2 L (in the 70s on room air).  Blood pressure 107/70.  LUNGS:  Scattered bilateral crackles.  CARDIAC:  S1, S2.  No audible S3.  She has a large panniculus.  There is no  abdominal tenderness.  No edema of the lower extremities.  Femoral catheter  showed no expressible pus.   LABORATORY:  White count 13,800, hemoglobin 7.9, platelets 489,000, PT-INR  5.5, sodium 133, potassium 3.2, chloride 93, CO2 28, glucose 130, total  protein 9.1 with an albumin of 2.5.  Blood cultures are pending.  Chest x-  ray shows patchy bilateral airspace disease.   PROBLEM:  1. Chills and fever with possible pneumonia.  Also need to rule out the      possibility of catheter-related sepsis, given her femoral Perm-A-Cath.  2. Hemoglobin variation - According to outpatient dialysis records, her      last documented hemoglobin was 9.7, so there has been at least a 2-g       drop in the setting of her supra-therapeutic INR.  Guaiac stools to      rule out GI blood loss.  3. Supra-therapeutic PT/INR.  4. ESRD on Tuesday, Thursday, Saturday dialysis.  5. IDDM on insulin.  6. Morbid obesity.  7. Obstructive sleep apnea on CPAP.  8. History of pulmonary embolus, on Coumadin with supra-therapeutic INR.   PLAN:  Blood cultures x2.  Zithromax and Rocephin empirically.  Follow up  chest x-ray.  Treat PT/INR with small doses of oral vitamin K. Guaiac  stools.  Add PPI.  GI consult if stools positive.  Continue usual dialysis.  Insulin and CBGs for diabetes.  Low-flow oxygen with CPAP at bedtime.           ______________________________  [name], M.D.     CBD/MEDQ  D:  09/11/2006  T:  09/12/2006  Job:  [id number]

## 2011-04-27 NOTE — Discharge Summary (Signed)
NAMEANABIYA, [name]               ACCOUNT NO.:  [id number]   MEDICAL RECORD NO.:  [id number]          PATIENT TYPE:  INP   LOCATION:  [id number]                         FACILITY:  [place]   PHYSICIAN:  Alvin C. Florene Glen, M.D.  DATE OF BIRTH:  January 03, 1961   DATE OF ADMISSION:  05/09/2005  DATE OF DISCHARGE:  05/13/2005                                 DISCHARGE SUMMARY   ADMISSION DIAGNOSES:  1.  Catheter sepsis.  2.  End-stage renal disease on chronic hemodialysis.  3.  Morbid obesity with complications.  4.  Diabetes mellitus.  5.  History of high blood pressure.   DISCHARGE DIAGNOSES:  1.  Methicillin-resistant Staphylococcus aureus sepsis secondary to the      right groin PermCath, now pulled.  2.  Hypotension secondary to volume depletion with dialysis, resolved with      intravenous fluids.  3.  End-stage renal disease.  4.  Insulin-dependent diabetes mellitus.  5.  History of high blood pressure.  6.  Morbid obesity with complications.   HISTORY OF PRESENT ILLNESS:  The patient is a 50 year old black female with  end-stage renal disease on chronic hemodialysis every Monday, Wednesday, and  Friday at the [facility].  She has had a right femoral  catheter placed by Dr. Oneida Alar on Apr 23, 2005 and has been feeling weak and  with malaise since that time.  She also has a right internal jugular  PermCath.  In the emergency room on admission, she is febrile with chills  and complaining of anorexia.   LABORATORY DATA ON ADMISSION:  Potassium 5.8.  White count 15,900 with left  shift, hemoglobin 13.9.   Chest x-ray shows no infiltrates or edema, normal heart size.   HOSPITAL COURSE:  The patient was admitted and empirically placed on  vancomycin and Fortaz in addition to her usual medications.  On May 10, 2005, CVTS pulled the right femoral dialysis catheter since its tunnel and  exit site reported had some drainage which was cultured.  It grew out  methicillin-resistant Staphylococcus aureus as well as the blood cultures.  Her fever defervesced.  Her left IG dialysis catheter was used, and it  functioned well.  She will complete a three-week course at the [place]  using vancomycin 1 g each dialysis.  This will be dosed through May 31, 2005.   The patient dialyzed June 1st and June 3rd.  3.5 L were pulled on the first  treatment.  Thereafter, she had refractory hypotension with systolic blood  pressures between 55 and 92.  Her normal systolic blood pressures as an  outpatient is approximately 110.  She was given several boluses of normal  saline to try and correct this.  On her second dialysis treatment, no fluid  was removed.  She ultimately required over a liter of fluid to bring her  blood pressure up to her baseline, and she felt much better.  This  hypotension delayed her discharge by a day or two.  Additionally, Lantus was  increased to 48 units at bedtime.   DISCHARGE MEDICATIONS:  1.  Vancomycin 1 g IV each dialysis through May 31, 2005.  2.  Midodrine 10 mg p.o. t.i.d.  3.  Lipitor 20 mg q.h.s.  4.  Reglan 5 mg q.a.c. and q.h.s.  5.  PhosLo 5 with each meal.  6.  Actos 45 mg q.a.m.  7.  Lantus 48 units q.h.s.  8.  InFeD 50 mg IV each week.  9.  EPO 1500 units IV each dialysis.  10. Levocarnitine 6800 mg IV each dialysis.      [name], P.A.      Darrold Span. Florene Glen, M.D.  Electronically Signed    RRK/MEDQ  D:  08/05/2005  T:  08/06/2005  Job:  [id number]   cc:   [facility]

## 2011-04-27 NOTE — Op Note (Signed)
[name], [name]               ACCOUNT NO.:  [id number]   MEDICAL RECORD NO.:  [id number]          PATIENT TYPE:  AMB   LOCATION:  SDS                          FACILITY:  [place]   PHYSICIAN:  Rosetta Posner, M.D.    DATE OF BIRTH:  1961/12/06   DATE OF PROCEDURE:  10/12/2005  DATE OF DISCHARGE:  10/12/2005                                 OPERATIVE REPORT   PREOPERATIVE DIAGNOSIS:  End-stage renal disease.   POSTOPERATIVE DIAGNOSIS:  End-stage renal disease.   OPERATION PERFORMED:  1.  Ultrasound visualization of femoral veins bilaterally followed by  2.  Placement of right femoral fifty-five centimeter Diatek catheter.   SURGEON:  Rosetta Posner, M.D.   ASSISTANT:  Nurse.   ANESTHESIA:  MAC.   COMPLICATIONS:  None.   DISPOSITION:  Disposition is to the recovery room stable.   OPERATION IN DETAIL:  The patient was brought to the operating room and  placed in the supine position there.  The right and left groins were prepped  and draped in the usual sterile fashion.  Ultrasound was used for  visualization of he femoral veins bilaterally.  Using local anesthesia and a  single wall puncture the right common femoral vein was entered and the  guidewire was passed up to the level of the right atrium.  Sequential  dilators were used to dilate the tract, and the dilator and peel-away sheath  were passed over the guidewire.  The dilator was removed and the 55 cm  Diatek catheter was passed up the through the peel-away with the weave  technique over the guidewire.  The guidewire and peel-away sheath were  removed.  The catheter was brought through a subcutaneous tunnel through a  separate stab incision and the two lumen ports were attached.   Both lumens were flushed and aspirates.  __________ .  The catheter was  secured to the skin with 3-0 nylon stitch and the underside was closed with  4-0 subcuticular Vicryl stitch.  A sterile dressing was applied.   The patient was taken to  the recovery room in stable condition.      Rosetta Posner, M.D.  Electronically Signed    TFE/MEDQ  D:  10/12/2005  T:  10/13/2005  Job:  [id number]

## 2011-04-27 NOTE — Op Note (Signed)
NAMEJENAN, KOVARIK               ACCOUNT NO.:  [id number]   MEDICAL RECORD NO.:  [id number]          PATIENT TYPE:  INP   LOCATION:  5522                         FACILITY:  [place]   PHYSICIAN:  Rosetta Posner, M.D.    DATE OF BIRTH:  1961/03/30   DATE OF PROCEDURE:  10/03/2004  DATE OF DISCHARGE:                                 OPERATIVE REPORT   PREOPERATIVE DIAGNOSIS:  End-stage renal disease.   POSTOPERATIVE DIAGNOSIS:  End-stage renal disease.   PROCEDURE:  Bilateral jugular vein ultrasound followed by left internal  jugular Diatek catheter placement.   SURGEON:  Rosetta Posner, M.D.   ASSISTANT:  Nurse.   ANESTHESIA:  General endotracheal anesthesia.   COMPLICATIONS:  None.   DISPOSITION:  To recovery room with chest x-ray pending.   DESCRIPTION OF PROCEDURE:  The patient was taken to the operating room and  placed in the supine position where area of the right and left neck were  ultrasound visualized.  I did not visualize a right IJ.  I did visualize  left internal jugular vein.  The patient was under general anesthesia.  The  patient did have a history of heparin allergy but takes heparin three times  per week on dialysis with pretreatment with Benadryl.  She did receive 25 mg  of IV Benadryl.  The patient was placed in Trendelenburg position.  The  right and left neck and chest were prepped and draped in usual sterile  fashion.  Using local anesthesia and a single wall puncture, the left  internal jugular vein was cannulated and a guidewire was passed down to the  level of the innominate vein.  The guidewire would continually pass up the  right innominate vein.  For this reason, and end hole catheter was passed  over the guidewire and using an angled Glidewire, with manipulation the  Glidewire was able to be passed down into the right atrium.  The single  Glidewire was exchanged for an Amplatz super stiff wire.  Dilator and peel-  away sheath was passed over the  Amplatz super stiff wire and the dilator was  removed.  Using the weave technique, the 32 cm Diatek catheter was  positioned through the peel-away sheath into the level of the right atrium.  The sheath and the guidewire were removed.  The catheter was brought through  a subcutaneous tunnel.  The two-lumen port was attached.  The first lumen  was flushed and aspirated easily, locked with 1000 units/mL heparin.  The  catheter was secured to the skin with 3-0 nylon stitch and the entry site  was closed with 4-0 subcuticular Vicryl stitch.  Also the patient did have  injection of IV contrast after the pretreatment with Benadryl when the  catheter initially was not passed into the right atrium from the jugular  approach.  The patient tolerated the procedure without immediate  complications and was transferred to the recovery room where a chest x-ray  was obtained.      Todd   TFE/MEDQ  D:  10/03/2004  T:  10/03/2004  Job:  [id number]

## 2011-04-27 NOTE — Discharge Summary (Signed)
[name], [name]               ACCOUNT NO.:  [id number]   MEDICAL RECORD NO.:  [id number]          PATIENT TYPE:  INP   LOCATION:  5509                         FACILITY:  [place]   PHYSICIAN:  Sherril Croon, M.D.   DATE OF BIRTH:  09/07/1961   DATE OF ADMISSION:  10/29/2005  DATE OF DISCHARGE:  11/07/2005                                 DISCHARGE SUMMARY   DISCHARGE DIAGNOSIS:  1.  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  2.  Pulmonary embolism.  3.  Catheter related infection with methicillin resistant Staphylococcus      aureus positive.  4.  End stage renal disease on dialysis Tuesday, Thursday and Saturday at      [facility].  5.  Secondary hyperparathyroidism.  6.  Status post prior thyroidectomy in 1994.  7.  Obesity.  8.  Anemia of chronic disease on Aranesp.  9.  Diabetes mellitus type 2.  10. Chronic hypotension.  11. Hyperlipidemia.   DISCHARGE MEDICATIONS:  1.  PhosLo 67 mg, 4 tablets, 3 tablets with each meal.  2.  Nephrovite tablet p.o. daily.  3.  Zocor 40 mg p.o. daily.  4.  Lantus as prior to admission.  5.  Actos as prior to admission.  6.  Trusopt as prior to admission.  7.  Xalatan as prior to admission.  8.  Midodrine 50 mg p.o. t.i.d.  9.  Elavil 10 mg p.o. q.h.s.  10. Coumadin start tomorrow at 5 mg p.o. daily, dose to be adjusted at      dialysis center according to PT and INR.  11. Protonix 40 mg p.o. daily.  12. Albuterol MDI 1-2 puffs q.4h. p.r.n.  13. Reglan 10 mg p.o. q.a.c. and h.s.  14. Zofran 4 mg p.o. p.r.n. nausea.  15. Vicodin 5/500 mg p.o. q.6h. p.r.n.   With dialysis, the patient is supposed to receive Epogen 8500 units.  The  patient is also supposed to receive InFeD 50 mg on Tuesday, Carnitine 6800  mg Tuesday, Thursday and Saturday, vancomycin 250 mg Tuesday, Thursday and  Saturday for four weeks, albumin 5%, 25 grams  p.r.n. for blood pressure.   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 while she was resting quietly in bed.  The patient denied any chest pain or cough.  She admits having some fever  and chills with a maximum temperature of 101.  She also admitted nausea and  vomiting that started the morning of admission.  She had several episodes of  vomiting of yellow, clear secretions.  In the emergency room, the patient  was found  to be tachycardic with a heart rate of 140 to 150.  She received  IV Cardizem.  She was admitted to the hospital for further assessment and  plan.   ALLERGIES:  The patient is allergic to heparin, iodine, and contrast media.   PHYSICAL EXAMINATION:  On admission, blood pressure 130/57, heart rate 146,  respiratory rate 30, O2 saturation 90% to 84% on room air.  General:  44-  year-old [place] American lady, morbidly obese, in acute distress, vomiting.  HEENT:  PERRLA, extraocular movements intact, clear oropharynx.  Neck:  Very  obese.  Cardiovascular:  Tachycardic, regular, no murmurs, no rubs.  Respiratory:  Clear to auscultation.  Abdomen:  Positive bowel sounds, soft,  right lower quadrant tenderness.  Extremities:  +3 edema.  Neurologic:  Nonfocal.  Psychiatric:  Alert and oriented x 3.   LABORATORY DATA:  On admission, sodium 129, potassium 4.1, chloride 99,  bicarb 23, BUN 40, glucose 227, white blood cells 11.8, hemoglobin 13.4,  hematocrit 41.8, platelets 218.  ABG showed pH 7.327, pCO2 40, pO2 113,  bicarb 21.  Anion gap 7.  BNP 50.8.   Chest x-ray showed mild congestion.  EKG showed sinus tachycardia, low voltage QRS.   HOSPITAL COURSE:  Problem 1:  Acute onset of shortness of breath.  VQ scan done on the day  after admission showed intermediate probability for PE.  The patient was  premedicated with prednisone and she underwent CT scan of the chest on  October 30, 2005.  Impression of that study was positive study for  bilateral pulmonary emboli, mild basilar atelectasis and superior vena cava  obstruction with multiple venous collaterals.  The patient was started on  Argatroban for hypercoagulable state and PE.  Prior to discharge, she was  also started on Coumadin p.o. and at discharge, she is stable, INR is 3.5.  She is to follow up with dialysis center for PT INR testing and adjustments  of the Coumadin dose.  She will leave on Coumadin 5 mg p.o. daily.   Problem 2:  Leukocytosis, fever, and tachycardia, most likely secondary to  infection.  Blood cultures came back positive for MRSA.  The patient was  discharged home on vancomycin for four more weeks.   Problem 3:  End stage renal disease on dialysis Tuesday, Thursday and  Saturday at [facility].  The patient will  continue dialysis as an outpatient as prior to admission.  There was a  problem in the hospital with dialysis access, we considered that the source  of infection of MRSA was the permanent catheter that was previously placed.  Radiology was consulted and they placed a second permanent catheter with  ultrasound and fluoroscopic guidance on October 31, 2005.  The patency of  the right IJ vein was confirmed with ultrasound.  The procedure was  uneventful and at the same time, radiology removed the tunneled femoral  catheter right the right femoral site.   Problem 4:  Secondary hyperparathyroidism.  The patient continued PhosLo.   Problem 5:  Hypotension.  The patient had problems with blood pressures when she came in the  hospital, she had blood pressure 40s/20s with blood cuff on  the upper arms bilaterally.  We noticed that when the blood pressure is  checked on the forearm, the blood pressures are within normal limits.  So,  we recommend blood pressure to be checked with the cuff on the forearm every  time.  She will continue Midodrine as  prior to admission.   Problem 6:  Hyperlipidemia.  The patient continued Zocor.   Problem 7:  Diabetes mellitus.  The patient is discharged home on her  previous hospitalization regimen of Lantus and Actos.   Problem 8:  Anemia secondary to chronic disease.  The patient will continue  InFeD and EPO as prior to hospitalization.   CONSULTATIONS:  Interventional radiology.   PROCEDURE:  October 29, 2005, central catheterization on right IJ.  October 31, 2005, removal of the right femoral permanent catheter and  insertion of permanent catheter on the right IJ.   DISCHARGE LABORATORY DATA:  Hemoglobin 12.2, white blood cell count 6.9,  platelets 283.  INR 3.5.   Vital signs:  Blood pressure 100/50, heart rate 75, respiratory rate 20, O2  saturation 95% on room air, temperature 99.5.  The patient is stable, she  will be followed up with [facility] as previously  instructed.      Carolin Guernsey, MD      Sherril Croon, M.D.  Electronically Signed    DA/MEDQ  D:  11/07/2005  T:  11/07/2005  Job:  [id number]   cc:   [facility]

## 2011-04-27 NOTE — Op Note (Signed)
NAMEMERCADIES, TRUXILLO               ACCOUNT NO.:  [id number]   MEDICAL RECORD NO.:  [id number]          PATIENT TYPE:  INP   LOCATION:  5522                         FACILITY:  [facility]   PHYSICIAN:  Rosetta Posner, M.D.    DATE OF BIRTH:  10-29-1961   DATE OF PROCEDURE:  09/30/2004  DATE OF DISCHARGE:                                 OPERATIVE REPORT   PREOPERATIVE DIAGNOSIS:  End stage renal disease with occluded right thigh  arteriovenous Gore-Tex graft.   POSTOPERATIVE DIAGNOSIS:  End stage renal disease with occluded right thigh  arteriovenous Gore-Tex graft.   PROCEDURE:  Thrombectomy and revision of venous anastomosis of right femoral  loop arteriovenous Gore-Tex graft.   SURGEON:  Rosetta Posner, M.D.   ASSISTANT:  Suzzanne Cloud, P.A.-C.   ANESTHESIA:  General endotracheal anesthesia.   COMPLICATIONS:  None.   DISPOSITION:  To the recovery room stable.   INDICATIONS FOR PROCEDURE:  The patient is a 50 year old black female with  end stage renal disease with an occluded right femoral loop AV Gore-Tex  graft.  This was initially placed in June 2005.  She has had three episodes  of occlusion and thrombolysis and interventional radiology in the past three  weeks.  The most recent was two days prior to this procedure.  It is  recommended that she undergo an attempt at surgical revision prior to  abandoning the access.  The patient suffers from morbid obesity with very  difficult ongoing access issues with this being her last usable extremity  for access.  She was brought to the holding area and anesthesia was unable  to place jugular central lines bilaterally for venous access.  I placed a  left subclavian central line for access for surgery.   PROCEDURE IN DETAIL:  The patient was taken to the operating room and placed  in supine position where the area of the right groin and right leg were  prepped and draped in the usual sterile fashion.  An incision was made  through the  prior oblique incision in the groin and carried down to isolate  the venous graft and venous anastomosis.  This was end-to-end to the  saphenous vein several cm from the saphenofemoral junction.  The  saphenofemoral junction was exposed.  Tributary branches were ligated with 3-  0 and 2-0 silk ties and divided.  The graft was thrombectomized and good in  flow was encountered.  This was flushed with heparinized saline and  reoccluded.  The decision was made to extend the graft to the common femoral  vein.  The Cooley clamp was placed around the saphenofemoral junction and  the common femoral vein and the saphenous vein was excised from the common  femoral vein.  A 7 mm Gore-Tex graft was brought onto the field, spatulated,  and sewn end-to-side to the saphenofemoral junction on the common femoral  vein with a running 6-0 Prolene suture.  Clamps were removed from the vein  and anastomosis was tested and found to be adequate.  Next, the graft was  cut to the appropriate length and was  sewn end-to-end to the old graft with  a running 6-0 Prolene suture.  The patient had extreme clotting and had to  have the graft thrombectomized of new fresh clot, as well.  She had a  heparin allergy.  The clamps were removed and a good thrill was noted in the  graft.  The wounds were irrigated with saline.  Hemostasis was obtained  with cautery.  The wounds were closed with 2-0 Vicryl in the subcutaneous  and the skin was closed with a 3-0 subcuticular Vicryl stitch.  A sterile  dressing was applied.  The patient was taken to the recovery room in stable  condition.      Todd   TFE/MEDQ  D:  10/03/2004  T:  10/03/2004  Job:  [id number]

## 2011-04-27 NOTE — Op Note (Signed)
[name], [name]               ACCOUNT NO.:  [id number]   MEDICAL RECORD NO.:  [id number]          PATIENT TYPE:  AMB   LOCATION:  XRAY                         [facility]ACILITY:  [facility]   PHYSICIAN:  Nelda Severe. Kellie Simmering, M.D.  DATE OF BIRTH:  December 23, 1960   DATE OF PROCEDURE:  06/26/2006  DATE OF DISCHARGE:  02/18/2006                                 OPERATIVE REPORT   PREOPERATIVE DIAGNOSIS:  End-stage renal disease.   POSTOPERATIVE DIAGNOSIS:  End-stage renal disease.   PROCEDURE:  1.  Ultrasound localization left common femoral vein.  2.  Insertion Dytek catheter via the left common femoral vein (55 cm.)   SURGEON:  Nelda Severe. Kellie Simmering, M.D.   FIRST ASSISTANT:  Nurse.   ANESTHESIA:  Local.   PROCEDURE IN DETAIL:  The patient was taken to the operating room, placed in  the Trendelenburg position.  The left groin was exposed and the left common  femoral vein was visualized using B-mode ultrasound and noted to be patent.  After infiltration with 1% Xylocaine and prepping and draping in a routine  sterile manner, the left common femoral vein was entered percutaneously,  guidewire was passed up the inferior vena cava under fluoroscopic guidance  to the level of the right atrium.  After dilating the tract appropriately, a  55 cm Dytek catheter was passed over the wire through the peel-away sheath  and positioned in the right atrium.  Sheath was removed.  The catheter  tunneled peripherally and secured with nylon sutures.   The wound was closed with Vicryl in a subcuticular fashion.  Sterile  dressing was applied.  The patient was taken to the recovery room in  satisfactory condition.           ______________________________  Nelda Severe Kellie Simmering, M.D.     JDL/MEDQ  D:  06/26/2006  T:  06/27/2006  Job:  [id number]

## 2011-04-27 NOTE — Op Note (Signed)
   NAME:  [name], [name]                         ACCOUNT NO.:  [id number]   MEDICAL RECORD NO.:  [id number]                   PATIENT TYPE:  OIB   LOCATION:  [id number]                                 FACILITY:  [facility]   PHYSICIAN:  Rosetta Posner, M.D.                 DATE OF BIRTH:  Jul 08, 1961   DATE OF PROCEDURE:  06/02/2003  DATE OF DISCHARGE:  06/02/2003                                 OPERATIVE REPORT   PREOPERATIVE DIAGNOSIS:  Infected nonfunctional segment of left femoral loop  arteriovenous Gore-Tex graft.   POSTOPERATIVE DIAGNOSIS:  Infected nonfunctional segment of left femoral  loop arteriovenous Gore-Tex graft.   PROCEDURE:  Removal of infected segment of left femoral loop arteriovenous  Gore-Tex graft.   SURGEON:  Rosetta Posner, M.D.   ASSISTANT:  Nurse.   ANESTHESIA:  General endotracheal.   COMPLICATIONS:  None.   DRAINS:  One quarter-inch Penrose in the base of wound.   PROCEDURE IN DETAIL:  The patient was taken to the operating room and placed  in the supine position, where the left leg and left groin were prepped and  draped in the usual sterile fashion.  The patient had a chronic draining  sinus in her left thigh down to the level of an old nonfunctional graft.  The wound was opened and debrided and did extend down to the Gore-Tex.  The  Gore-Tex was mobilized proximally and distally.  It was not taken all the  way back to the anastomoses but was taken past the evidence of chronic  indolent involvement.  This was sent for culture as well.  The graft was  removed throughout this segment and the wound was copiously irrigated with  saline and hemostasis obtained with electrocautery.  The quarter-inch  Penrose drain was placed at the base of the wound.  The wound was closed  loosely with 2-0 nylon mattress sutures.  A sterile dressing was applied,  and the patient was taken to the recovery room in stable condition.     Rosetta Posner, M.D.    TFE/MEDQ  D:  06/02/2003  T:  06/03/2003  Job:  [id number]

## 2011-04-27 NOTE — Discharge Summary (Signed)
NAMESHANLEY, [name]               ACCOUNT NO.:  [id number]   MEDICAL RECORD NO.:  [id number]          PATIENT TYPE:  INP   LOCATION:  5530                         FACILITY:  [facility]   PHYSICIAN:  Donato Heinz, M.D.DATE OF BIRTH:  [birth year]   DATE OF ADMISSION:  08/28/2005  DATE OF DISCHARGE:  09/01/2005                                 DISCHARGE SUMMARY   ADMITTING DIAGNOSIS:  1.  Fever.  2.  Acute worsening of chronic hypotension.  3.  Insulin-dependent diabetes mellitus.  4.  End-stage renal disease on chronic hemodialysis.  5.  Secondary hyperparathyroidism.  6.  Obesity.   DISCHARGE DIAGNOSIS:  1.  Methicillin-resistant Staphylococcus aureus bacteremia felt secondary to      PermCath.  2.  Hypotension probably exacerbated by bacteremia, improved.  3.  End-stage renal disease on chronic hemodialysis.  4.  Insulin-dependent diabetes mellitus.  5.  Secondary hyperparathyroidism.  6.  Obesity.   BRIEF HISTORY:  This 50 year old black female with past medical history of  end-stage renal disease on chronic hemodialysis Tuesday-Thursday-Saturday at  the [facility], has experienced hard shaking chills and  a temperature spike to 101.6 near the end of her dialysis treatment on the  day of admission. Blood cultures were drawn and she received 2 grams of  vancomycin and 2 grams of Fortaz IV during dialysis. Despite 1700 cc of  normal saline her systolic blood pressure remained low between 60 and 80;  she felt poorly; and she was transferred to [facility] for admission.   The patient had MRSA bacteremia in May 2006 which required exchange of a  PermCath, at that time; and she received a new right groin PermCath. She,  again, had bacteremia from the same organism July 2006; and, again. the  catheter was removed and replaced, at that time. She has been without  infection since July 2006. Labs on admission white count 8200, hemoglobin  11.1, hematocrit 34.9,  platelets 245,000.  Sodium 133, potassium 3.8,  chloride 97, CO2 28, glucose 188, BUN 25, creatinine 7.5, calcium 7.7,  albumin 2.8, phosphorus 3.6. Chest x-ray chronic lung disease without edema  or effusion.   HOSPITAL COURSE:  The patient was admitted, placed on her usual medications,  and continued on Fortaz and vancomycin. Workup included cardiac enzymes  which were negative. Cortisol level was within normal limits at 10.4. Her  midodrine was increased to 15 mg t.i.d. and her levocarnitine was continued  at 6.8 gm IV each dialysis treatment. Due to the patient being end-stage  access, we will conservative in removing this current femoral catheter. She  seemed to respond to antibiotic therapy and her blood pressure was returning  toward her baseline of approximately 100. She was asymptomatic without  lightheadedness, dizziness, or weakness with ambulation. She was discharged  home improved, afebrile; and to continue vancomycin for a 3-week course, to  receiving 1 gm IV each dialysis for October 14. Her right femoral catheter  remained in. We will obtain surveillance blood cultures 2 weeks after  antibiotic therapy completed.   DISCHARGE MEDICATIONS:  1.  PhosLo 667 mg  three with each meal.  2.  __________  1 daily.  3.  Reglan 10 mg one with meals and bedtime.  4.  Zocor 40 mg with supper.  5.  Actos 45 mg daily.  6.  Lantus 12 units at bedtime.  7.  Trusopt 2%.  8.  Valentin 0.005% eye drops 1 drop both eyes daily.  9.  Midodrine 15 mg t.i.d. and before dialysis.  10. InFeD 50 mg IV every Tuesday and dialysis.  11. Levocarnitine 6.8 gm IV each dialysis.  12. EPO 8500 units IV each dialysis.  13. Vancomycin 1 gm IV each dialysis through October 14.      [name], P.A.    ______________________________  Donato Heinz, M.D.    RRK/MEDQ  D:  10/19/2005  T:  10/21/2005  Job:  [id number]   cc:   [facility]

## 2011-04-27 NOTE — Op Note (Signed)
NAMEJANELDA, [name]               ACCOUNT NO.:  [id number]   MEDICAL RECORD NO.:  [id number]          PATIENT TYPE:  INP   LOCATION:  5124                         FACILITY:  [facility]   PHYSICIAN:  James L. Rolla Flatten., M.D.DATE OF BIRTH:  [birth year]   DATE OF PROCEDURE:  09/12/2006  DATE OF DISCHARGE:                                 OPERATIVE REPORT   PROCEDURE:  Colonoscopy.   MEDICATIONS:  The patient received a total of fentanyl 100 mcg, Versed 7.5  mg IV for both procedures.   INDICATIONS:  Heme-positive stool.   DESCRIPTION OF PROCEDURE:  The procedure explained to the patient, consent  obtained.  With the patient in the left lateral decubitus position, the  Olympus scope was inserted and advanced.  The adult scope was used.  It is  notable there was a large amount of blood on her bottom and perineal area  and buttocks.  It was bright red.  We wiped all this off and found the  rectum.  There was no digital blood on the rectal exam.  We passed the scope  easily to the cecum.  Ileocecal valve and appendiceal orifice were seen.  The scope was withdrawn from the cecum.  Ascending, transverse, descending  and sigmoid colon were seen well.  No AVMs, polyps, cancers, or diverticula  were seen throughout.  The scope was withdrawn in the rectum.  The rectum  was completely free of lesions.  Only small hemorrhoids were seen in the  retroflex view in the anal canal.  Several passes were made to the anal  canal.  I could not identify any fissures, anal polyps, etc., and no  bleeding source.  There was no bright blood there.  After the scope was  withdrawn, we spread the patient's buttocks as much as we could, which was  somewhat difficult due to her obesity, and it appeared that the blood may  have been coming from her vagina anteriorly from her rectum.  The scope was  withdrawn.  The patient tolerated the procedure well.   ASSESSMENT:  1. Heme-positive stool.  2. Bright blood on the  perineal area that may be menses, with no source      identifiable in the colon, rectum, or perirectal area.   PLAN:  Will follow the patient clinically.           ______________________________  Joyice Faster Rolla Flatten., M.D.     Jaynie Bream  D:  09/12/2006  T:  09/12/2006  Job:  [id number]   cc:   Caren Griffins B. Lorrene Reid, M.D.

## 2011-04-27 NOTE — Discharge Summary (Signed)
NAMEPAOLINA, [name]               ACCOUNT NO.:  [id number]   MEDICAL RECORD NO.:  [id number]          PATIENT TYPE:  INP   LOCATION:  [id number]                         FACILITY:  [facility] [facility] [place]   PHYSICIAN:  Louis Meckel, M.D.DATE OF BIRTH:  October 31, 1961   DATE OF ADMISSION:  08/21/2006  DATE OF DISCHARGE:  08/23/2006                                 DISCHARGE SUMMARY   ADMISSION DIAGNOSES:  1. Right shoulder pain with incidental finding of internal jugular deep      vein thrombosis.  2. Insulin-dependent diabetes mellitus.  3. End-stage renal disease on chronic hemodialysis.  4. Hypothyroidism.   DISCHARGE DIAGNOSES:  1. Right shoulder pain improved with pain medication, for open MRI as      outpatient.  2. Subtherapeutic INR using Lovenox as bridge.  3. Right internal jugular thrombosis.  4. End-stage renal disease on chronic hemodialysis.  5. Insulin-dependent diabetes mellitus.  6. Hypothyroidism.   BRIEF HISTORY:  This is a 55-year black female with end-stage renal disease  on chronic hemodialysis every Tuesday, Thursday, Saturday at the [facility], who has a history of pulmonary embolism on chronic  Coumadin therapy and a history of access difficulty, currently using a  femoral PermCath.  She presented to the emergency room complaining of right  shoulder pain extending into her chest.  Pain was sudden in onset  approximately 2 weeks ago but has become worse, limiting her range of  motion.  In the ER, a shoulder film showed a prominent subacromial space,  raising the question of joint effusion.  Her INR was found to be 1.6 and she  was given 130 mg of subcutaneous Lovenox and set up for vascular study on  the day of admission, which revealed a thrombosis in the right internal  jugular.  The she is being admitted for pain control and further evaluation.   ADMISSION LABORATORY DATA:  White count 7800, hemoglobin 9.1, platelets  516,000.  Pro time 18.2, INR  1.6.   HOSPITAL COURSE:  The patient was placed on her usual medications including  her usual home dose of Coumadin at 5 mg daily.  Lovenox at 130 mg  subcutaneously daily was used as a bridge.  A CT scan of the right shoulder  was ordered; however, a recommendation from radiology an MRI was the  preferable study.  The patient was too large to date to fit in the closed  scanner; therefore, it was necessary to schedule an outpatient MRI in the  open MRI scanner.  Using Vicodin to control her pain, her symptoms improved.   An open MRI was scheduled DRI for September 16 at 7 a.m. on the right  shoulder.  Home health nurse arrangements were made for daily Lovenox  injections, and the patient was discharged home improved.  Her pro time will  be followed each dialysis treatment until she is therapeutic again with an  INR greater than 2, at which time Lovenox will be stopped.  We will follow  up MRI results as an outpatient.   DISCHARGE MEDICATIONS:  1. Actos 45 mg q.a.m.  2. Lantus 40 units at bedtime.  3. Nephro-Vite vitamin daily.  4. Calcium 500 mg five with meals.  5. Coumadin 5 mg q.h.s.  6. Midodrine 10 mg t.i.d.  7. Trusopt 2% eye drops in both eyes b.i.d.  8. Xalatan 0.005% one drop in both eyes b.i.d.  9. Zocor 40 mg at bedtime.  10.Baby aspirin 81 mg daily.  11.Lovenox 130 mg subcutaneous shot daily until INR is greater than or      equal to 2.   Open MRI at [facility] September 16, 7 a.m., of the right shoulder      [name], P.A.    ______________________________  Louis Meckel, M.D.    RRK/MEDQ  D:  10/18/2006  T:  10/19/2006  Job:  [id number]

## 2011-04-27 NOTE — Discharge Summary (Signed)
NAME:  [name], [name]                         ACCOUNT NO.:  [id number]   MEDICAL RECORD NO.:  [id number]                   PATIENT TYPE:  INP   LOCATION:  5522                                 FACILITY:  [facility]   PHYSICIAN:  Sol Blazing, M.D.             DATE OF BIRTH:  [birth year]   DATE OF ADMISSION:  06/17/2004  DATE OF DISCHARGE:                                 DISCHARGE SUMMARY   DISCHARGE DIAGNOSES:  1. Methicillin-sensitive Staphylococcus aureus sepsis.  2. Bilateral diffuse infiltrates, probably secondary to septic emboli.  3. Obstructive sleep apnea.  4. Morbid obesity.  5. Type 2 diabetes mellitus.  6. Anemia.  7. Hypotension.   PROCEDURES:  1. Hemodialysis.  2. Spiral chest CT scan on June 20, 2004, new large central pulmonary     emboli, bilateral air space disease, scattered atelectasis, cardiomegaly.   HISTORY OF PRESENT ILLNESS:  [name] is a 50 year old African-American  female with end-stage renal disease who dialyzes on Tuesday, Thursday, and  Saturday at the [facility].  She also has morbid obesity  and chronic hypotension who felt bad after dialysis earlier this morning.  She progressed to having difficulty ambulating with lightheadedness and  severe weakness.  She was brought to the emergency room by her husband.  In  the emergency room, her temperature was 100 degrees Fahrenheit and blood  pressure readings were falsely initially high.  The readings such as  160/140, which I believe was not accurate initially.  When I examined the  patient, she was very lethargic, falling asleep, and had a thready cough.  We were able to obtain blood pressures on the wrists with 30  systolic/palpable.  She had a thready femoral pulse as well.  We gave her  large volume of fluid and Dopamine and she responded.  The remainder of the  physical examination is outlined in the admission history and physical.   LABORATORY DATA:  White count 20,800,  hematocrit 34, platelets normal.  Total bilirubin 1.5, albumin 3, calcium 7.9.  Sodium 133, potassium 3.5, BUN  10, creatinine 5.8 pH 7.3.   HOSPITAL COURSE:  #1 -  METHICILLIN-SENSITIVE STAPHYLOCOCCUS AUREUS SEPSIS:  The patient was admitted to the intensive care unit, given IV fluids,  Dopamine, empiric vancomycin and Zosyn following pan culture.  Chest x-ray  showed cardiomegaly and no acute disease.  Repeat chest x-ray on June 18, 2004, showed interval development of diffuse bilateral pulmonary  infiltrates.  It was felt that her infection was related to her dialysis  catheter.  It was removed and cultures showed methicillin-sensitive  Staphylococcus aureus at the catheter exit site from the catheter tip and  from 1:2 blood cultures once sensitivities were back.  She was changed to  Ancef.  The patient was transferred from the ICU to 5500 on June 20, 2004.  She continued to do well.  She worked with mobility team and  will finish a  four week course of Ancef as an outpatient, to include surveillance  cultures, two weeks into treatment and two weeks after treatment.  Additionally, the patient had a new right femoral graft that was ready to  use.  When her catheter was removed it was four weeks old, it was used  without any problems.  This will be her new outpatient access.  #2 -  DIFFUSE BILATERAL INFILTRATES, PROBABLY SECONDARY TO SEPTIC EMBOLI:  These were noticed on chest x-ray of June 18, 2004, the day after her  admission.  She continued to be very hypoxic at times with low blood  pressures.  Spiral CT scan was done and was negative for PE.  As previously  mentioned, she will receive a four week course of antibiotics, and should  have a followup chest x-ray in one month.  #3 -  OBSTRUCTIVE SLEEP APNEA:  The patient required supplemental oxygen in  the hospital and uses BiPAP at home.  She was able to ambulate on room air  with adequate O2 saturations at the time of discharge.  #4  -  TYPE 2 DIABETES MELLITUS:  Sugars were a little high in the hospital.  Lantus will be increased to 50 units subcutaneously q.h.s.  #5 -  ANEMIA:  Hemoglobin at discharge was 11.  There is no change in her  Epogen and iron management.  #6 -  HYPOTENSION:  This was most likely related to sepsis.  Blood pressure  was at baseline at the time of discharge.  She continues on Midodrine her  same dose.  #7 -  END-STAGE RENAL DISEASE:  The patient will continue on her Tuesday,  Thursday, and Saturday dialysis schedule.  There were some weight variances  during her hospitalization;  however, her post-dialysis weight on the  standing scale was 137.1 kg.  Her outpatient dry weight will be 138.5 kg.  There are no other changes in her dialysis orders.  Phosphorus on June 18, 2004, was 4.8.  On June 20, 2004, it was 4.3.  She had not been on any  phosphate binders, and it is noted that her hospital diet I am sure is not  anything like her home diet.  Her previous outpatient dose of phosphate  binders was PhosLo 667 mg five tablets with meals t.i.d.  Her phosphorus  today on dialysis with the resumption of PhosLo showed a significant  decreased to 1.8.  Therefore, we will hold PhosLo for several days and  resume on Saturday with four tablets with meals t.i.d.  I suspect her  outpatient diet has a lot more phosphorus in it than the hospital diet, as  well as calories.   CONDITION ON DISCHARGE:  Improved.   DISPOSITION:  The patient is discharged to home.   DISCHARGE MEDICATIONS:  1. Lantus 50 units subcutaneously q.h.s.  2. Nephro-Vite one tablet daily.  3. Reglan 10 mg a.c. and h.s.  4. Midodrine 10 mg before dialysis and t.i.d.  5. PhosLo 667 mg four tablets with meals t.i.d.   DIET:  Renal diabetic.  Limit fluids to 5 cups per day.   SPECIAL INSTRUCTIONS:  Use BiPAP at home.   FOLLOWUP:  Tuesday, Thursday, and Saturday dialysis at [facility].  Dialysis orders are the  same except for a new dry weight of  138.5 kg.  She is to receive Ancef 2 g IV every hemodialysis through July 13, 2004, to equal four weeks total.  Surveillance  blood cultures are to be  drawn on July 23 and July 29, 2004, the later which is two weeks after  completing her antibiotics.  She is also to have followup chest x-ray in one  month at [facility] to follow up bilateral infiltrates.      Alric Seton, P.A.                      Sol Blazing, M.D.    MB/MEDQ  D:  06/22/2004  T:  06/22/2004  Job:  [id number]   cc:   [facility]

## 2011-04-27 NOTE — Discharge Summary (Signed)
[name], [name]               ACCOUNT NO.:  [id number]   MEDICAL RECORD NO.:  [id number]          PATIENT TYPE:  INP   LOCATION:  5508                         FACILITY:  [facility]   PHYSICIAN:  Sol Blazing, M.D.DATE OF BIRTH:  1961/04/03   DATE OF ADMISSION:  06/21/2006  DATE OF DISCHARGE:  06/28/2006                                 DISCHARGE SUMMARY   ADMITTING DIAGNOSES:  1. Hypotension suspect catheter sepsis.  2. Chronic hypotension on midodrine.  3. End-stage renal disease on chronic hemodialysis.  4. History of atrial fibrillation on chronic Coumadin therapy with Supra-      therapeutic INR.  5. Insulin-dependent diabetes mellitus type 2.  6. Obesity   DISCHARGE DIAGNOSIS:  1. Methicillin-resistant staph aureus bacteremia secondary to catheter      sepsis.  2. Methicillin-resistant staph aureus catheter tip infection status post      removal of dialysis catheter [id number].  3. Status post placement of a new left femoral dialysis catheter      [id number] by CVTS.  4. Acute on chronic hypotension resolved.  5. Insulin-dependent diabetes mellitus type 2.  6. History of atrial fibrillation on chronic Coumadin therapy with INR      corrected to within  goal range   BRIEF HISTORY:  50 year old black female with end-stage renal disease on  chronic hemodialysis secondary to diabetic nephropathy on chronic dialysis  every  Tuesday, Thursday, Saturday at the [facility] who  is  being admitted with 24-hour history of nausea, vomiting and chills.  She  denies cough, chest pain, abdominal pain or dysuria.  She has a dialysis  catheter as her chronic access.  She has a history of MRSA bacteremia in the  past.  Labs on admission white count 18,900 with 90% segs, hemoglobin 13.5,  platelets 220,000.  PT 38.9, INR 4.0  Sodium 132, potassium 4.8, chloride  92, CO2 24, glucose 202, BUN 29, creatinine 9.1, calcium 8.8, albumin 3.1.   HOSPITAL COURSE:  The patient  was admitted and pancultured.  Chest X-Ray:  Showed low lung volumes with bibasilar atelectasis.  Fever was as high as  102.  She was empirically started on vancomycin and Zosyn.  And required  Levophed drip to maintain blood pressure at 90.  Dialysis catheter was  pulled on the evening of admission and she was given approximately 72-hour  hiatus from indwelling PermCath.  Her blood cultures grew out methicillin-  resistant staph aureus as well as the catheter as well as catheter tip from  the  catheter that was pulled.  With the catheter out and the antibiotics on  board her blood pressure began to improve and she was able to be weaned off  Levophed within about 48 hours.  She received a new  femoral dialysis catheter on July17 with Dr. Drucie Opitz without any  complications.  This was located in the left femoral vein.  She remained  afebrile.  Her blood pressure steady and she was discharged to complete a 3-  week vancomycin course receiving 1 gram IV each dialysis through August4.   After  treatment of her infection and Levophed was weaned she was continued  on midodrine 10 mg t.i.d. and predialysis as before.  Her blood pressures  were at baseline at time of discharge to approximately 100/54.   In view of an elevated INR her Coumadin was held and she was bridged with  Lovenox.  It was evident that the patient was to have several procedures  done this admission including removal and replacement dialysis catheter and  her  anticoagulated state was treated with several units of fresh frozen  plasma in order to perform procedures.  Once all procedures were completed  Coumadin was resumed at slightly lower previous dose.  At time of discharge  she was discharged home on at a therapeutic INR of 2.3.  Her Coumadin is  being continued at 5 mg every dialysis and 2.5 mg on non-dialysis days to be  taken every evening.  This will be monitored at the [facility].   DISCHARGE MEDICATIONS:   Midodrine 10 mg t.i.d. and predialysis, Coumadin 5  mg on dialysis days, 2.5 mg non dialysis days, Zocor 40 mg every evening,  Nephro-Vite vitamin one daily, Actos 45 mg q.a.m. Reglan 10 mg a.c. and h.s.  PhosLo 667 mg three with meals.  Lantus 45 units q.h.s. Xalatan 0.005%  eyedrops 1 drop in eyes daily, vancomycin 1 gram IV each dialysis through  August4, EPO 10,000 units IV each dialysis.      [name], P.A.      Sol Blazing, M.D.  Electronically Signed    RRK/MEDQ  D:  09/01/2006  T:  09/03/2006  Job:  [id number]

## 2011-04-27 NOTE — Discharge Summary (Signed)
[name], [name]               ACCOUNT NO.:  [id number]   MEDICAL RECORD NO.:  [id number]          PATIENT TYPE:  INP   LOCATION:  5522                         FACILITY:  [facility]   PHYSICIAN:  Louis Meckel, M.D.DATE OF BIRTH:  10-27-1961   DATE OF ADMISSION:  09/29/2004  DATE OF DISCHARGE:  10/06/2004                                 DISCHARGE SUMMARY   ADMISSION DIAGNOSES:  1.  Volume overload.  2.  End-stage renal disease on chronic hemodialysis.  3.  Status post clotted arteriovenous Gore-Tex graft with lysis of clotted      femoral arteriovenous Gore-Tex graft.  4.  Hyperkalemia.  5.  History of methicillin-sensitive Staph aureus graft infection.   DISCHARGE DIAGNOSES:  1.  Volume overload with signs of congestive heart failure.  2.  Hyperkalemia.  3.  End-stage renal disease with access thrombolysis.  4.  Chronic obstructive pulmonary disease with obstructive sleep apnea.  5.  Diabetes mellitus type 2.  6.  Hyperparathyroidism.  7.  Secondary hypotension.  8.  Morbid obesity.   BRIEF HISTORY AND REASON FOR ADMISSION:  [name] is a 50 year old black  female with end-stage renal disease on chronic hemodialysis with history of  multiple vascular access problems having her last dialysis on October 18,  missing dialysis the day before this admission because of clotted femoral AV  Gore-Tex graft.  She had thrombolysis on 09/22/04 and also on the day before  this admission on September 28, 2004, she presented again to the radiology  department for thrombolysis of her femoral AV Gore-Tex graft on the right  side.  Dr. Kathlene Cote on September 29, 2004 took the patient to the radiology  suite.  Shunt gram was performed.  The graft showed recurrent thrombosis,  less than 24 hours.  An angioplasty was performed by Dr. Kathlene Cote.  She was  admitted to the hemodialysis unit for hemodialysis because of volume  overload.  No hemodialysis since September 26, 2004 with a potassium of  5.6.   PHYSICAL EXAMINATION:  By Alric Seton, P.A. on admission showed morbid,  obese, black female resting in the supine state, mildly short of breath, in  no acute distress.  CHEST:  Difficult to auscultate because of poor inspiration and obesity.  The patient was remained on a stretcher lying supine after angioplasty of  femoral AV Gore-Tex graft.  CARDIAC EXAM:  Revealed a regular rate and rhythm auscultated.  No rub  appreciated.  ABDOMEN:  Morbidly obese, soft and nontender.  EXTREMITIES:  Revealed right thigh with a deep AV Gore-Tex graft present.  Scant bleeding appreciated.  No palpable thrill appreciated.  A bruit was  audible by Doppler only.  SKIN:  Warm and dry.  NEUROLOGICAL:  Alert and oriented x3.  Moves all extremities without  difficulty.   LABORATORY DATA:  Potassium 5.6.  CBC and BMET pending.   She was admitted by Dr. Moshe Cipro for her volume overload and need for  hemodialysis.  Hypotension with initiation of hemodialysis on the 21st and  again resume hemodialysis October 22, at [facility] per  her routine.  It was noted that she did have MSSA culture on her thigh in  the past and was started on Ancef treatment for that.  Check blood cultures  x3.   HOSPITAL COURSE:  Problem 1.  End-stage renal disease with volume overload,  access complications.  The patient was placed on hemodialysis on admission.  However, access did not work well.  Repeat potassium did come back at 6.5.  The patient refused p.o. Kayexalate and Dr. Moshe Cipro gave the patient an  enema.  Dr. Sherren Mocha Early was consulted for access placement.  She was taken to  the operating room on September 30, 2004 by Dr. Donnetta Hutching.  Had failed  thrombectomy and revision.  The left Ditech was placed on October 03, 2004  by Dr. Donnetta Hutching and is scheduled to have a new left upper on AV Gore-Tex graft  on October 09, 2004 by Dr. Donnetta Hutching as an outpatient.  Her potassium improved  with  hemodialysis.  Her volume improved also.  She was noted to have normal  dry weight at the time of discharge down to 132.0 kg.  She was not short of  breath at the time of discharge with potassium 4.2 on the 27th.   Problem 2.  Chronic obstructive pulmonary disease/morbid obesity and  obstructive sleep apnea.  The patient was noted to have a known history of  obstructive sleep apnea.  CPAP was ordered, but the patient refused to wear  this.  Respiratory status improved with the patient's new hemodialysis  access IV or IJ catheter working.  She was diuresed down to a new dry weight  and her respirations improved.  She was up walking in her room without  difficulty and discharged home on October 06, 2004.   Problem 3.  Diabetes mellitus.  The patient had a serum glucose of 81 on  admission with glucoses stable throughout the hospital stay.  She will  continue on her Actos 45 mg q. breakfast.  Also Lantus 30 units at bedtime.  Accu-Cheks will be followed as an outpatient and she will check her blood  sugars at home also.   Problem 4.  Secondary hyperparathyroidism.  The patient had a phosphorus of  11 on admission.  It was 4 in the hospital.  She was placed on her phosphate  binders of PhosLo 667 4 a.c.  She was not started on vitamin D as her  _________ level check in hospital was 6.0 on October 04, 2004.  On October 05, 2004 _________ parathyroid hormone was 14.1.   Problem 5. Hypotension.  The patient was hypotensive in the hospital and  also as an outpatient.  She had been on Midodrine 10 mg p.o. t.i.d. which  she is compliant as an outpatient.  She will be continued on midodrine as an  outpatient with blood pressures and the dry weights to be evaluated as an  outpatient.  Her blood pressure at discharge was systolic at [id number] and was  cleared for discharge home by Dr. Jimmy Footman.  The patient reportedly was  asymptomatic.  There was question if this was an accurate blood  pressure. Again, she will be on midodrine 10 mg p.o. for hemodialysis and t.i.d.  Note, her blood pressure was 90/50 on predialysis on October 05, 2004 and  95/55 post hemodialysis on October 05, 2004 after 3883 mL/U.   At the time of discharge __________ 1 daily, Reglan 10 mg a.c. h.s.,  midodrine 10 mg t.i.d. and 1 before dialysis, PhosLo 667 mg  4 a.c., Actos 45  mg q. breakfast, Elavil 25 mg q. bedtime.  Lantus Insulin 30 units at  bedtime.  Epogen 6000 units q. hemodialysis.   DIET:  ADA restricted with 80 g of protein, 2 g of sodium, 2 potassium.  Limit all fluids to 4 cups only each day.   She was instructed not to take any showers with her PermCath and keep it dry  and clean.  Also not to have any food or drink after midnight.  Show up at 7  a.m. on short stay on September 29, 2004 for Dr. Donnetta Hutching to place a new upper  arm AV Gore-Tex graft.       DWZ/MEDQ  D:  12/29/2004  T:  12/30/2004  Job:  [id number]   cc:   [place]   Rosetta Posner, M.D.  [address]  [place]  [place] [address]

## 2011-04-27 NOTE — Op Note (Signed)
NAME:  [name], [name]                         ACCOUNT NO.:  [id number]   MEDICAL RECORD NO.:  [id number]                   PATIENT TYPE:  OIB   LOCATION:  2866                                 FACILITY:  [facility]   PHYSICIAN:  Rosetta Posner, M.D.                 DATE OF BIRTH:  February 27, 1961   DATE OF PROCEDURE:  01/25/2004  DATE OF DISCHARGE:                                 OPERATIVE REPORT   PREOPERATIVE DIAGNOSES:  End-stage renal disease with occluded left femoral  loop AV Gore-Tex graft.   POSTOPERATIVE DIAGNOSES:  End-stage renal disease with occluded left femoral  loop AV Gore-Tex graft.   PROCEDURE:  Thrombectomy with interposition jump graft revision of higher  vein of left femoral loop AV Gore-Tex graft.   SURGEON:  Rosetta Posner, M.D.   ASSISTANT:  Jobe Igo. Zigmund Gottron.   ANESTHESIA:  General endotracheal anesthesia.   COMPLICATIONS:  None.   DISPOSITION:  To recovery room stable.   INDICATIONS FOR PROCEDURE:  The patient is a morbidly obese 50 year old  black female with a long history of renal failure and end-stage renal  disease, with AV access issues.  She has had a longstanding left femoral  loop graft, with multiple prior revisions of this.  She presented to  radiology department one day prior to this for thrombolysis of a left  femoral loop graft.  This also had PTA of mid graft stenosis.  The patient  had acute occlusion of this, and is now taken to the operating room for  attempt at surgical thrombectomy.   The patient had a history of pork heparin allergy, which causes itching  (which has been successfully treated with Benadryl in the past).   DESCRIPTION OF PROCEDURE:  The patient was taken to the operating room and  placed supine.  The left groin and left leg were prepped and draped in the  usual sterile fashion.  Incision was made over the prior femoral anastomosis  and carried down to the arterial and venous anastomoses.  The graft was  opened near  the venous anastomosis and there was moderate stenosis at the  venous anastomosis.  Attention remained at simply flushing the graft with  saline, rather than heparinizing the patient or using heparinized saline  flush.  The patient is extremely hypercoagulable and would clot the graft  easily, despite flushing.  The vein was exposed further proximally and  interposition graft of 7 mm Gore-Tex was brought into the field; sewn end-to-  end to the saphenous vein at the saphenofemoral junction; then was also sewn  end-to-end to the old graft.  The arterialized plug had been removed.   Despite attempts at flushing the graft and with the anastomosis partially  completed, then immediately opened the graft; there was still thrombus  present.  For this reason, the patient was pretreated with 50 mg Benadryl IV  and was given a test  dose of 1000 units of heparin -- had no reaction.  She  was given 6000 units of Heparin.  The graft was again thrombectomized and  there was no further acute clotting.  The graft was thrombectomized, a 3.5  mm dilator passed through the arterial anastomosis, and the arterial  anastomosis was explored through an incision near the arterial anastomosis  as well.   The graft was closed after the usual flush maneuvers, and thrill was noted.  The wounds were irrigated with saline.  Hemostasis was achieved with  electrocautery.  Wounds were closed with 2-0 Vicryl in the subcutaneous  tissue in several layers.  The skin was closed with 4-0 subcuticular Vicryl  stitch.  Sterile dressing was applied and the patient was taken to the  recovery room in stable condition.                                               Rosetta Posner, M.D.    TFE/MEDQ  D:  01/25/2004  T:  01/25/2004  Job:  [id number]

## 2011-04-27 NOTE — Discharge Summary (Signed)
NAME:  [name], [name]                         ACCOUNT NO.:  [id number]   MEDICAL RECORD NO.:  [id number]                   PATIENT TYPE:  INP   LOCATION:  5501                                 FACILITY:  [facility]   PHYSICIAN:  Rosetta Posner, M.D.                 DATE OF BIRTH:  June 29, 1961   DATE OF ADMISSION:  01/25/2004  DATE OF DISCHARGE:  01/27/2004                                 DISCHARGE SUMMARY   ADMIT DIAGNOSIS:  End-stage renal disease with occluded left femoral loop  arteriovenous GoreTex graft.   PAST MEDICAL HISTORY:  1. End-stage renal disease.  2. OFA on CPAP of 12 at night.  3. Hypertension.  4. Secondary HVTH.  5. Anemia.  6. Status post ETL.   ALLERGIES:  IV DYE, _________, and HEPARIN, which causes itching.   DISCHARGE DIAGNOSES:  1. End-stage renal disease with occluded left femoral loop arteriovenous     GoreTex graft status post thrombectomy with revision of left femoral loop     arteriovenous GoreTex graft, and status post insertion of right internal     jugular Diatek catheter.  2. Hypotension.   BRIEF HISTORY AND PHYSICAL:  The patient is a morbidly obese 50 year old  black female with a long history of renal failure and end-stage renal  disease with IV access issues.  The patient has had a long-standing left  femoral loop graft with multiple prior revisions to this.  The patient  presented to the radiology department on the day prior to admission for  thrombolysis of the left femoral loop graft.  This also had PTA of midgraft  stenosis.  The patient had an acute occlusion of this, and was subsequently  taken to the OR by Dr. Donnetta Hutching for an attempt at surgical thrombectomy.  While  in the recovery room, the patient was evaluated by the renal service, and  was noted to be hypotensive.  The patient was therefore admitted for  observation and hemodialysis.   HOSPITAL COURSE:  The patient was admitted and taken to the OR on January 25, 2004, for a  thrombectomy with interpositional _________ graft revision  of the higher vein of the left femoral loop AV GoreTex graft.  The patient  tolerated the procedure well and was taken to the recovery room in stable  condition.  The patient was extubated without problem, and woke up from  anesthesia neurologically intact.  While in the recovery room, the patient  was evaluated by the renal service, who were consulted regarding her end-  stage renal disease.  The patient was noted to be hypotensive at that time.  Preoperatively, the patient's blood pressure was 91/61, and throughout her  procedure the blood pressure fluctuated between 80-120/40-80s.  The patient  did receive anesthetic medications and analgesia, including fentanyl,  Versed, and Dilaudid IV postprocedure.  The patient's hypotension was  believed to be secondary to sedation as well  as the pain medication.  The  patient's morphine was held, and she was given Toradol to provide pain  relief.  The patient was monitored on telemetry, and was continued on Neo-  Synephrine, which was to be weaned as tolerated to keep the systolic blood  pressure greater than 90.  The patient was also noted to be hyperkalemic.  She was supposed to go to dialysis on the date of her surgery.  However, she  was obviously not able to go.  The patient was given Kayexalate p.o.  The  patient was taken to dialysis on January 26, 2004.  However, the left  femoral graft could not be accessed.  The patient was subsequently given a  Kayexalate retention enema.  The patient's hypotension was still present  postoperative day 1.  It was the opinion of the renal service that they  should wait to see if the medication would wear off before treating with  other medications.  Dr. Donnetta Hutching evaluated the patient and stated that she was  end-stage access, and therefore if the left femoral graft could not be  accessed, a Diatek catheter would have to be placed.  The patient was   subsequently returned to the [facility] on January 26, 2004, for placement of a  right IJ Diatek catheter by Dr. Kellie Simmering.  The patient tolerated the procedure  well, and was stable postoperatively.  The patient was started on midodrine  on January 27, 2004.  She was treated for hypotension.  On postoperative  day 2, the patient was in stable condition.  She was afebrile, and her blood  pressure was still marginally low, with systolic in the [id number] and diastolic  running [id number].  The patient's heart rate ranged from 80-100.  The patient's  hyperkalemia was treated with retention enema and hemodialysis status post  Diatek catheter placement.  The patient's left groin site was healing well.  The patient's dialysis catheter is functioning properly, and the patient's  blood pressure is controlled with midodrine.  The patient is felt to be  stable for discharge at this time.  She will return for hemodialysis on the  Saturday following discharge.  Prior to discharge, the patient's potassium  was 5.0, and therefore she was taken for a 2-1/2 hour hemodialysis.   LABS:  CBC on January 26, 2004:  Hemoglobin 12, hematocrit 37.1, white  count 8.7, platelets 263.  BMP on January 26, 2004:  Sodium 135, potassium  5.0, BUN 63, creatinine 15.1, glucose 110.  PT and INR on January 26, 2004,  were 14.9 and 1.3.  Calcium on January 26, 2004:  6.4.   CONDITION ON DISCHARGE:  Improved.   INSTRUCTIONS:  1. Medications:  Reglan 10 mg, one p.o. q.a.m. and q.p.m.; Prilosec 20 mg     one p.o. q.d.; Elavil 25 mg one p.o. q.h.s.; Lantus 36 units subq q.h.s.;     Actos 30 mg one p.o. q.a.m.; midodrine 10 mg one p.o. t.i.d.; Nephro-Vite     one p.o. q.d.; PhosLo five tablets with meals.  Pain management:  Tylox     one to two p.o. q.4-6h. p.r.n. pain.  2. Diet:  Renal diet 100 g of protein, 2 g of sodium and 2 g of potassium. 3. Wound care:  The patient is to shower daily and clean the incision with     soap and water, and  if the incision becomes red, swollen, or painful, or     drains, or the patient has a fever  of 101 degrees Fahrenheit, she is to     call the CVTS office at [phone].  4. Follow-up appointments:  Patient is to return to [facility] for dialysis on Tuesday, Thursday and Saturday.  The patient's     first appointment will be on January 29, 2004.  She is to return to     follow-up with Dr. Donnetta Hutching as needed for care of the femoral graft.      Leta Baptist, [place]                      Rosetta Posner, M.D.    AY/MEDQ  D:  01/27/2004  T:  01/28/2004  Job:  [id number]

## 2011-04-27 NOTE — H&P (Signed)
[name], [name]               ACCOUNT NO.:  [id number]   MEDICAL RECORD NO.:  [id number]          PATIENT TYPE:  OIB   LOCATION:  2899                         FACILITY:  [facility]   PHYSICIAN:  Sherril Croon, M.D.   DATE OF BIRTH:  1961-04-29   DATE OF ADMISSION:  06/28/2005  DATE OF DISCHARGE:                                HISTORY & PHYSICAL   HISTORY OF PRESENT ILLNESS:  [name] is a 50 year old black woman who  receives hemodynamically on Tuesdays, Thursdays, and Saturdays at [facility] due to end-stage renal disease.  Yesterday, due to  poor blood flow rates, she came to short stay for overnight dwell and left  IJ, but because arterial port would not push, we arranged for radiology to  exchange catheter today.  She presented today to radiology with a  temperature, chills, vomiting, and diarrhea that began yesterday evening.  She also complains of a productive cough x2 days.  She no longer voids.  Her  blood pressure has been an issue, as well.  She receives levocarnitine after  hemodialysis.  When the levocarnitine was prescribed, she stopped her  midodrine three times a day, but yesterday in short stay her blood pressure  was in the 70s (usual blood pressures in the 80s to 90s).  I told her  yesterday to take her midodrine last night and this a.m., and today she  states she did.  Today, she presents with a systolic blood pressure in the  60s.   PAST MEDICAL HISTORY:  1.  End-stage renal disease associated with pregnancy toxemia, proteinuria,      and uncontrolled hypertension.  Hemodialysis began in November 1991.  2.  Anemia of chronic disease.  3.  Status post parathyroidectomy with auto-transplantation in March 1994      due to secondary hyperparathyroidism.  4.  Multiple vascular access issues currently.  5.  Morbid obesity.  6.  History of childhood smallpox.  7.  History of pseudotumor cerebri in May 1991, followed by Dr. Jannifer Franklin.  8.  Status  post bilateral tubal ligation in March 1995 by Dr. Tobie Poet.  9.  Bilateral carpal tunnel syndrome with left release in March 1999.  10. Obstructive sleep apnea followed by Dr. Baird Lyons.  11. Hypotension.  12. Insulin-dependent diabetes mellitus type 2.  13. A 2-D echocardiogram in October 2005 which revealed normal left      ventricular function with an ejection fraction of 55% to 65% and mild      tricuspid regurgitation.  14. Chronic interstitial lung disease.  15. Dyslipidemia.  16. Gastroesophageal reflux disease.  17. Respiratory failure with laryngeal spasm/intubation, postoperative left      upper arm AV graft thrombectomy in 2005.   SOCIAL HISTORY:  [name] has children.  She is single.  She denies tobacco,  alcohol, or drug use.   FAMILY HISTORY:  Noncontributory.   ALLERGIES:  HEPARIN, but tolerated with Benadryl 25 mg p.o. x1 prior to  heparin bolus in dialysis.   MEDICATIONS:  1.  Nephro-Vite one tablet p.o. daily.  2.  PhosLo 667  mg five with meals.  3.  Lantus 30 units at bedtime.  4.  Reglan 10 mg p.o. q.a.c. and h.Holly.  5.  Zocor 40 mg p.o. daily.  6.  Trusopt 2% one drop bilateral eyes one a day.  7.  Dilantin 0.005% one drop OU every day.  8.  Azmacort two puffs three times a day.  9.  Elavil 25 mg p.o. q.h.Holly.  10. Actos 45 mg p.o. q.a.m.  11. Midodrine 10 mg; took one dose last night and today.  12. Levocarnitine 6800 mg IV after hemodialysis.  13. Epogen 5000 units IV q hemodialysis.   REVIEW OF SYSTEMS:  Currently she denies shortness of breath or chest pain,  bloody stools, headache, or dizziness.  See HPI.  See past medical history.   LABORATORY DATA:  Sodium 127, potassium 4.3, chloride 90, CO2 of 23, BUN 43,  creatinine 10.9, glucose 187, calcium 7.9.  Hemoglobin 11.9, WBC'Holly 12.2,  platelets 308.  INR of 1.2.  Chest x-ray revealed no acute disease per Dr.  Barbie Banner.   PHYSICAL EXAMINATION:  VITAL SIGNS:  Temperature 101.6, pulse 108,  respirations 24,  blood pressure 100/42 (previously it was in the 60s).  GENERAL APPEARANCE:  Alert and oriented x3.  CHEST:  Clear to auscultation.  HEART:  Tachycardic but regular rhythm.  ABDOMEN:  Positive bowel sounds, soft.  Access catheter __________ but dried  yellow crust.  No active drainage.  EXTREMITIES:  Trace pretibial edema.  Previous femoral cath site on the  right is clear.  No signs of infection.  SKIN:  Diaphoretic.  NEUROLOGIC:  Alert and oriented x3.   IMPRESSION:  1.  Febrile illness, probably due to cath sepsis.  2.  Hypotension.  3.  Anemia.  4.  Insulin-dependent diabetes mellitus type 2.  5.  End-stage renal disease.   PLAN:  1.  Antibiotics.  Cath exchange tomorrow.  2.  Midodrine and levocarnitine.  3.  Epogen.  4.  Insulin and Actos and Accu-Cheks.  5.  __________.       MJG/MEDQ  D:  06/28/2005  T:  06/28/2005  Job:  [id number]

## 2011-04-27 NOTE — H&P (Signed)
[name], [name]               ACCOUNT NO.:  [id number]   MEDICAL RECORD NO.:  [id number]          PATIENT TYPE:  OIB   LOCATION:  2106                         FACILITY:  [facility]   PHYSICIAN:  [name], M.D.DATE OF BIRTH:  April 05, 1961   DATE OF ADMISSION:  01/11/2005  DATE OF DISCHARGE:                                HISTORY & PHYSICAL   CHIEF COMPLAINT:  Respiratory distress status post thrombectomy of the left  upper arm, arteriovenous graft.   HISTORY OF PRESENT ILLNESS:  [name] is a 50 year old morbidly obese  African American female who is status post adrenal disease.  There is  placement of left internal __________ the afternoon of January 15, 2005.  She was admitted for same day surgery for an elective thrombectomy of her  left upper arm arteriovenous graft as it had thrombosed.  She was taken to  the operating room by [name].  She was intubated for  surgery.  Intra-operatively, she underwent thrombectomy of her left upper  arm arteriovenous graft with resection of redundant graft.  Due to her  systolic blood pressure readings in the 90's, [name] felt that she was  high risk for re-thrombosing her graft.  Based on this, he also inserted a  left internal jugular Diatek catheter while she remained under anesthesia.  There were no known intraoperative complications however, once she was  extubated and in the recovery unit, she was noted to be in respiratory  distress, increased work of breathing, probably laryngospasm.  Chest x-ray  was questionable for bilateral pneumonia.  It was elected that she should be  re-intubated for her respiratory distress and transferred to the intensive  care unit.  She was started on Propofol drip for sedation.   End-stage renal disease on hemodialysis at [facility], on hemodialysis  days Tuesday, Thursday, Saturday.  Nephrologist [name]. Diabetes mellitus  type 2.  History of sleep apnea.   Hypertension.  Hypothyroidism secondary to  end-stage renal disease.  History of methicillin-resistant staphylococcus  aureus infection of her dialysis catheter.  Anemia.  Pseudotumor cerebri.  Status post multiple hemodialysis access failures including bilateral arms  and thighs.  History of bilateral tubal ligation.  History of  parathyroidectomy.  Hyperlipidemia.  Reflux.  Questionable for asthma.   ALLERGIES:  She is allergic to iodine, heparin and IV contrast dye.  She  does not eat pork.   SOCIAL HISTORY:  Currently unobtainable from patient however, based on  admission chart and history, she does not use alcohol or tobacco products.  She lives with her sister.   FAMILY HISTORY:  Currently unobtainable.   REVIEW OF SYSTEMS:  Currently unobtainable however, per her admission health  history form, she did report recent upper respiratory infection with sinus  congestion x1 month.  She also has intermittent right hand numbness.  She  suffers from headaches.  She also has arthritic pain and increased pressure  in her eyes.  She also reports shortness of breath with exertion lying down.  She uses CPAP for her obstructive sleep apnea.   MEDICATIONS:  1.  Forestine Na  10 mg one p.o. daily.  2.  Reglan 10 mg one p.o. daily.  3.  Actos 45 mg one p.o. daily.  4.  Lipitor 20 mg one p.o. daily.  5.  PhosLo gelcaps, four tablets q.a.c.  6.  Calcium acetate 667 mg daily.   PHYSICAL EXAMINATION:  Physical examination based on preoperative findings.  VITAL SIGNS:  Temperature 98.0, heart rate 82, respirations 20, blood  pressure 113/77, height 5 feet 2 inches, weight 279 pounds.  GENERAL:  Obese.  HEENT/NECK:  Exams within normal limits.  LUNGS:  Sounds were clear.  HEART:  Sounds were regular.  Telemetry showing sinus rhythm.  Postoperative  post re-intubation findings showed her sedated on the ventilator with  response to pain.  She was tachycardic at 112 with distant heart sounds.  Lungs  showed bibasilar rhonchi and crackles.  She was on a ventilator with  vent settings set at titer volume of 600, rate of 12, PEEP of 5 and 50%  FIO2.  She did have a left internal jugular Diatek catheter present.  ABDOMEN:  Morbidly obese with positive bowel sounds and nondistended.  EXTREMITIES:  Showed loose skin turgor with trace lower extremity edema.  She had no palpable distal pulses although a left radial Doppler signal was  confirmed intra-operatively.  Her left upper arm graft currently had no  thrill which had been present after thrombectomy of her graft.   IMPRESSION:  1.  End-stage renal disease status post thrombectomy and revision of her      left upper arm arteriovenous graft and placement of a new left internal      jugular Diatek catheter.  2.  Postoperative respiratory distress requiring re-intubation.  X-ray      showing questionable bilateral pneumonia.   PLAN:  The patient re-intubated and placed on ventilatory support.  Transferred to unit 2100.  Avelox IV started for possible pneumonia.  Renal  consult for management of end-stage renal disease and in help with her other  medical issues such as anemia and diabetes.  Plan for hemodialysis on  January 12, 2005 with hope to initiate weaning of protocol afterwards.   NOTE:  [name] feels she has no further options for arteriovenous graft  at this point.  For now, her left internal jugular Diatek catheter will be  used for hemodialysis.      AWZ/MEDQ  D:  01/12/2005  T:  01/12/2005  Job:  [id number]

## 2011-04-27 NOTE — Discharge Summary (Signed)
[name], [name]               ACCOUNT NO.:  [id number]   MEDICAL RECORD NO.:  [id number]          PATIENT TYPE:  OIB   LOCATION:  6730                         FACILITY:  [facility]   PHYSICIAN:  Judeth Cornfield. Scot Dock, M.D.DATE OF BIRTH:  1961/10/22   DATE OF ADMISSION:  01/11/2005  DATE OF DISCHARGE:  01/15/2005                                 DISCHARGE SUMMARY   HISTORY OF PRESENT ILLNESS:  This is a 50 year old morbidly obese African-  American female; admitted for elective thrombectomy and revision of her left  upper arm arterial venous graft, that has been nonfunctional.  The procedure  was unsuccessful, and intraoperative left IJ Diatek was placed.  The patient  had a postoperative complication of laryngeal spasm, necessitating  intubation.  She was admitted to the hospital for further intensive  management.   PAST MEDICAL HISTORY:  1.  End-stage renal disease, secondary to hypertension.  2.  Nephrosclerosis, secondary to pre-eclampsia.  3.  Diabetes type 2 insulin dependent.  4.  Cerebral tumor cerebri.  5.  Obstructive sleep apnea.  6.  Hypertension.  7.  Hyperparathyroidism, secondary to end-stage renal disease.  8.  Multiple access procedures.  9.  Anemia.  10. Morbid obesity.   ALLERGIES:  1.  PORCINE HEPARIN (requires Benadryl prior to hemodialysis).  2.  IODINE.  3.  Questionable IV DYE.   MEDICATIONS ON ADMISSION:  1.  Lodosyn 10 mg q.d.  2.  Reglan 10 mg q.d.  3.  Actos 45 mg q.d.  4.  Lipitor 20 mg q.d.  5.  Phos-Lo gel caps four tablets q.a.c.  6.  Calcium acetate 667 mg q.d.   FAMILY HISTORY/SOCIAL HISTORY/REVIEW OF SYMPTOMS/PHYSICAL EXAM:  Please see  the history and physical done at time of admission.   HOSPITAL COURSE:  The patient underwent the procedure as described, and had  postoperative pulmonary failure requiring intubation.  She was seen in  conjunction with the renal service throughout her stay for medical  management.  The patient has  been treated aggressively with pulmonary  management as well, and was able to be weaned from the ventilator.  She was  shown to have bibasilar lung densities, consistent with atelectasis versus  possible pneumonia; was covered for Avelox during the hospitalization.  She  has been weaned from oxygen and maintained good saturations on room air.  She is hemodynamically stable; although she seems to maintain a low relative  blood pressure.   It is occluded, but she does have a functional permanent catheter.  Her  diabetes is under adequate control.  Her anemia is stable and she continues  her outpatient management of erythropoietin and iron supplements.  Her end-  stage renal disease is stable on current management, and she is in adequate  condition for outpatient treatments.   The patient has had some issues with nausea and vomiting.  She has been  started on Protonix.  It is felt at this time that when stable from the  nausea viewpoint she is medically stable for discharge in the next day or  so.   DISCHARGE MEDICATIONS:  1.  Midodrine 10 mg t.i.d.  2.  Reglan 10 mg q.d.  3.  Actos 45 mg q.d.  4.  Zocor 40 mg q.d.  5.  Calcium acetate 660 mg t.i.d.  6.  Tylox p.r.n.  7.  Avelox 400 mg q.d. (for an additional five days).   FINAL DIAGNOSES:  1.  Postoperative respiratory failure, following thrombectomy and revision      of left upper arm graft.  Now stable.  2.  Possible postoperative pneumonia.   OTHER DIAGNOSES:  1.  End-stage renal disease.  2.  Nephrosclerosis.  3.  Diabetes mellitus type 2, insulin dependent.  4.  Pseudotumor cerebri.  5.  Obstructive sleep apnea.  6.  Hypertension.  7.  Hyperparathyroidism, secondary to end-stage renal disease.  8.  Anemia.  9.  Morbid obesity.   DISCHARGE INSTRUCTIONS:  The patient received written instructions in regard  to medications, activity, wound care and follow-up.  The patient will follow  up with outpatient hemodialysis.   CVTS follow-up will be p.r.n.      WEG/MEDQ  D:  01/14/2005  T:  01/15/2005  Job:  [id number]   cc:   Judeth Cornfield. Scot Dock, M.D.  [address]  [place]  [place] [address]   Donato Heinz, M.D.  [address]  [place]  [place] [address]  Fax: [phone]

## 2011-04-27 NOTE — Op Note (Signed)
NAMEALLYNE, REPOSA               ACCOUNT NO.:  [id number]   MEDICAL RECORD NO.:  [id number]          PATIENT TYPE:  INP   LOCATION:  5124                         FACILITY:  [facility]   PHYSICIAN:  James L. Rolla Flatten., M.D.DATE OF BIRTH:  17-Feb-1961   DATE OF PROCEDURE:  DATE OF DISCHARGE:                                 OPERATIVE REPORT   PROCEDURE:  Esophagogastroduodenoscopy and biopsy.   MEDICATIONS:  1. Cetacaine spray.  2. Medications per nurses' were not broken down between the endo and colon      and the information given to me.   INDICATIONS FOR PROCEDURE:  Heme-positive stool in a 50 year old dialysis  patient.   DESCRIPTION OF PROCEDURE:  Procedure explained to the patient and consent  obtained.  In the left lateral decubitus position, the Olympus scope was  inserted and advanced.  The stomach was entered, pylorus passed.  The  duodenum, including the bulb and second portion, were seen well and were  unremarkable.  The scope was withdrawn back into the antrum, and along the  posterior wall of the antrum was a reddened, edematous area with several  erosions.  It had the appearance of a healing ulcer.  Severely biopsies were  taken of this.  No other abnormalities were seen in the antrum.  The fundus  and cardia were seen well on the retroflexed view.  The scope was withdrawn.  The distal and proximal esophagus were endoscopically normal.  The patient  tolerated the procedure well, was maintained on low-flow oxygen for pulse  oximetry throughout the procedure.   ASSESSMENT:  1. Heme-positive stool.  2. Probable gastritis in the antrum or healing ulcer.   PLAN:  We will check path and proceed with colonoscopy at this time.           ______________________________  Joyice Faster Rolla Flatten., M.D.     Jaynie Bream  D:  09/12/2006  T:  09/12/2006  Job:  [id number]   cc:   Caren Griffins B. Lorrene Reid, M.D.

## 2011-04-27 NOTE — Op Note (Signed)
NAMEHARTENSE, DELANE               ACCOUNT NO.:  [id number]   MEDICAL RECORD NO.:  [id number]          PATIENT TYPE:  INP   LOCATION:  5124                         FACILITY:  [facility]   PHYSICIAN:  Judeth Cornfield. Scot Dock, M.D.DATE OF BIRTH:  1961/02/07   DATE OF PROCEDURE:  09/16/2006  DATE OF DISCHARGE:                                 OPERATIVE REPORT   PREOPERATIVE DIAGNOSIS:  Chronic renal failure.   POSTOPERATIVE DIAGNOSIS:  Chronic renal failure.   PROCEDURE:  Placement of a 55 cm right femoral Diatek catheter.   SURGEON:  Judeth Cornfield. Scot Dock, M.D.   ANESTHESIA:  Local with sedation.   TECHNIQUE:  The patient was taken to the operating room and sedated by  anesthesia.  The right groin was prepped and draped in the usual sterile  fashion.  I had identified the femoral vein with a duplex scan.  The skin  was anesthetized and the right femoral vein was cannulated; a guidewire  introduced into the inferior vena cava.  Tracting the wire was dilated and  then a dilator and peel-away sheath were passed over the wire, and the wire  and dilator were removed.  The catheter was passed through the peel-away  sheath and positioned in the right atrium.  The exit site for the catheter  was selected and the skin anesthetized between the 2 areas.  The catheter  was then brought through the tunnel, cut to the appropriate length, and the  distal ports were attached.  Both ports withdrew easily and then flushed  with heparinized saline and filled with concentrated heparin.  The catheter  was secured at the exit site with a 3-0 nylon suture.  The femoral  cannulation site was closed with a 4-0 subcuticular stitch.  A sterile  dressing was applied.  The patient tolerated the procedure well and was  transferred to the recovery room in satisfactory condition.  All needle and  sponge counts were correct.      Judeth Cornfield. Scot Dock, M.D.  Electronically Signed     CSD/MEDQ  D:  09/16/2006   T:  09/16/2006  Job:  [id number]

## 2011-04-27 NOTE — Discharge Summary (Signed)
NAMEBELVIE, DELLES               ACCOUNT NO.:  [id number]   MEDICAL RECORD NO.:  [id number]          PATIENT TYPE:  INP   LOCATION:  5124                         FACILITY:  [facility]   PHYSICIAN:  Caren Griffins B. Lorrene Reid, M.D.DATE OF BIRTH:  27-Aug-1961   DATE OF ADMISSION:  09/08/2006  DATE OF DISCHARGE:  09/17/2006                                 DISCHARGE SUMMARY   DISCHARGE DIAGNOSES:  1. Methicillin-resistant Staphylococcus aureus bacteremia.  2. History of pulmonary emboli with supratherapeutic INR.  3. Anemia.  4. End-stage renal disease.  5. Insulin dependent diabetes mellitus.  6. Secondary hyperparathyroidism.  7. Protein malnutrition.  8. Morbid obesity.  9. Obstructive sleep apnea.   PROCEDURES:  1. September 12, 2006 EGD and biopsy.  Assessment:  Heme-positive stool.      Probable gastritis in the interim or healing ulcer.  2. September 12, 2006 colonoscopy.  Assessment:  (1) Heme-positive stool.      (2) Bright blood on the perineal area that may be menses with no source      identifiable in the colon, rectum or perirectal area.  3. September 13, 2006 left femoral dialysis catheter removal.  4. September 16, 2006 right femoral dialysis catheter insertion.  5. September 16, 2006 transabdominal and transvaginal pelvic ultrasound.      Impression:  (1) Limited study due this to the patient's size.  (2) The      uterus in the endometrium appear normal.  The ovaries were not      visualized.   CONSULTATIONS:  1. Dr. Laurence Spates  2. Dr. Deitra Mayo  3. Princess Bruins, M.D.   HISTORY OF PRESENT ILLNESS:  This is a 50 year old black female with end-  stage renal disease who dialyzes on Tuesday, Thursday and Saturdays who  presented to the emergency department with a history of chills, fevers,  generalized weakness, and worsening exertional dyspnea. The patient has  insulin-requiring diabetes, morbid obesity, chronic hypotension, history of  pulmonary embolus in the past for  which she takes Coumadin and dialysis  access issues with her current dialysis access being a left femoral  hemodialysis catheter.  She had nausea and general feeling of malaise and  unwellness as well as cough and chills for several days prior to coming to  the emergency department. In the emergency room she was noted to have a  white count of 13,800, hemoglobin low at 7.9 and INR 5.5 and a chest x-ray  showing patchy air space disease possibly consistent with pneumonia and she  was admitted for further evaluation and treatment.  She denied drainage from  her hemodialysis catheter in her groin, denies dark stools, menorrhagia or  bright red rectal bleeding.   ADMISSION LABORATORY DATA:  WBC 13.8, hemoglobin 7.9, hematocrit 24.6,  platelet count 489, PT 54.0, INR 5.5.  Sodium 133, potassium 3.2, chloride  93, CO2 28, glucose 130, BUN 22, creatinine 6.6, total bilirubin 0.9,  alkaline phos 215, AST 12, ALT 10, total protein 9.1, albumin 2.5 and  calcium 9.0. Blood culture final result MRSA.   DIAGNOSTIC RADIOLOGICAL EXAMINATIONS:  September 09, 2006 two-view  chest x-ray.  Impression:  1. Mild edema.  2. Patchy areas of atelectasis bilaterally.   September 09, 2006 two-view chest x-ray status post dialysis. Findings:  Cardiomegaly and pulmonary edema unchanged, small effusions seen on the  lateral radiograph in the costophrenic angle posteriorly.  There patchy  areas of air space disease within the right base and within the left mid  lung.   September 12, 2006 two-view chest x-ray.  Impression:  1. Improving bilateral areas of atelectasis.  2. Cardiomegaly, mild edema.   September 16, 2006 one-view chest x-ray. Impression:  Mild interstitial  prominence may be due to vascular crowding although mild pulmonary edema is  a consideration.   HOSPITAL COURSE:  1. MRSA bacteremia.  The patient was admitted. Blood cultures were      obtained and antibiotic therapy was initiated empirically and  blood      cultures were positive for MRSA and the patient was changed to      vancomycin therapy which she seemed to tolerate well without      difficulty. The source of the bacteremia most likely the femoral      catheter. The patient's left femoral catheter was discontinued on      September 13, 2006 after her INR had decreased significantly and a right      femoral dialysis catheter was placed on September 16, 2006. The patient      tolerated the procedures well and will continue vancomycin at the      outpatient hemodialysis center.   Problem 2.  History of pulmonary emboli with supratherapeutic INR. As stated  above, the patient's INR was significantly elevated at the time of  admission. The patient received multiple doses of oral vitamin K therapy as  well as FFP. During the hospitalization it was determined that the patient  had received the prescribed treatment length for her Coumadin therapy for  pulmonary emboli and her Coumadin was discontinued.  At the time of  discharge the patient's INR was 1.3.   Problem 3.  Anemia.  At the time of admission it was noted that the  patient's hemoglobin was decreased. The patient was transfused 2 units of  packed red blood cells as well as being placed on proton pump inhibitor  therapy and Aranesp therapy.  The patient did have one episode of heme-  positive stool at which time GI was consulted.  An EGD and colonoscopy were  performed with results as stated above and with no active source of bleeding  found in the GI tract.  Gynecology was consulted. It was thought that the  patient's decreased hemoglobin may be secondary to heavy menses. During the  patient's hospitalization her menses subsided. Transvaginal ultrasound was  performed per the request of the gynecologist with results as stated above  and the patient received several units of packed red blood cells during her hospitalization and towards discharge her hemoglobin had stabilized. On  the  day of discharge the patient's hemoglobin was 9.7, hematocrit 29.6.   Problem 4.  End-stage renal disease.  Upon admission the patient was found  to have pulmonary edema via a chest x-ray that was performed.  The patient  received two consecutive hemodialysis treatments to aid in volume removal.  As stated above in #1 the patient's left femoral catheter was discontinued  during her hospitalization and a new femoral catheter was placed in her  right side on September 16, 2006.  Hemodialysis was continued throughout the  patient's entire  hospitalization with average blood flow rate of 300.  Average ultrafiltration of 4 liters.  The patient's vital signs remained  stable during dialysis well with a systolic ranging in the low 100-130.  The  patient tolerated her hemodialysis well without difficulty.   Problem 5.  Insulin-dependent diabetes mellitus. Upon admission the patient  was initially started on her outpatient diabetic regimen. During her  hospitalization she began to have frequent episodes of hypoglycemia and as a  result her Actos and Lantus therapy was discontinued.  She remained only on  sliding scale insulin with Accu-Cheks performed a.c. and h.s.  The patient  after the Lantus and Actos therapy was discontinued the patient's  hypoglycemic episodes resolved and on the day of discharge the patient's  blood glucose levels ranged from 113 to 130.   Problem 6.  Secondary hyperparathyroidism. The patient was initially  continued on her outpatient phosphate binder therapy.  However, during her  hospitalization the patient's phosphorus level was significantly decreased  at which time her phosphate binder therapy was discontinued and she was  encouraged to eat ice cream to increase her phosphorus. For the remainder of  the hospitalization the patient remained off of phosphate binder therapy and  on day of discharge her calcium was 8.6 and phosphorus 2.8.   Problem 7.  Protein  malnutrition. It was noted based on the patient's labs  that she suffers from some protein malnutrition. Dietician was consulted at  that time and the patient was placed on supplementation which she seemed to  tolerate well and without difficulty. On the day of discharge the patient's  albumin increased to 3.2.   Problem 8.  Morbid obesity.  During the patient's hospitalization diet and  exercise was discussed with the patient and she was urged to comply as the  adverse effects of obesity was discussed with her as well.  The patient  verbalized understanding and this will continue to be followed up at the  outpatient hemodialysis center.   Problem 9.  Obstructive sleep apnea.  The patient continued her CPAP at  bedtime throughout her entire hospitalization.  She tolerated it well  without difficulty   DISCHARGE MEDICATIONS:  1. Aspirin 81 mg one p.o. daily.  2. Zocor 40 mg one p.o. q.h.s. 3. Xalatan 0.005% one drop each eye b.i.d.  4. Trusopt 2% 1 drop each eye b.i.d.  5. Midodrine 10 mg one p.o. t.i.d.  6. Nephro-Vite one p.o. daily.  7. Regular insulin sliding scale.  8. The patient is to discontinue any Lovenox or Coumadin therapy.  9. Omeprazole 20 mg one p.o. q.h.s. which is new for the patient secondary      to the gastritis and/or healing ulcer discovered on the patient's EGD.      The patient has also been instructed to hold her Lantus, Actos and      PhosLo therapy. These will be followed at outpatient hemodialysis      center and restarted when appropriate.   HEMODIALYSIS MEDICATIONS:  1. Epogen 29,500 units IV q. hemodialysis treatment.  2. Vancomycin 1 gram IV q. hemodialysis treatment through September 10, 2006.   DISCHARGE INSTRUCTIONS:  Activity as tolerated.  The patient is to resume a  renal diabetic diet with a 1200 mL fluid restriction.  No showers or  swimming at this time. The patient may sponge bathe only.  She must keep her  catheter site dry. The patient  will return to the outpatient hemodialysis  center as scheduled on  Wednesday. The patient has been instructed to  schedule an appointment with Dr. Dellis Filbert with [facility] to evaluate for  IUD placement secondary to heavy menses during the hospitalization.   HEMODIALYSIS INSTRUCTIONS:  Please obtain an in-center hemoglobin q.  hemodialysis treatment x3 and then q.week x2. Notify Florene Brill with results.  Surveillance blood cultures will be obtained on October 14, 2006.  Please  obtain a pretreatment vancomycin level on Friday September 20, 2006. Please  discontinue PT/INR's from the patient's  monthly labs as she is no longer on Coumadin therapy. Please reschedule the  patient's orthopedic appointment which she missed during her hospitalization  for a rotator cuff tear. Please decrease the patient's estimated dry weight  to 131.5 kg.      Tracey P. Sherrod, NP    ______________________________  [name], M.D.    TPS/MEDQ  D:  09/17/2006  T:  09/19/2006  Job:  [id number]   cc:   [place] Rolla Flatten., M.D.  Judeth Cornfield. Scot Dock, M.D.  Princess Bruins, M.D.

## 2011-04-27 NOTE — H&P (Signed)
[name], [name]               ACCOUNT NO.:  [id number]   MEDICAL RECORD NO.:  [id number]          PATIENT TYPE:  INP   LOCATION:  [place]                         FACILITY:  [facility]   PHYSICIAN:  Alvin C. Florene Glen, M.D.  DATE OF BIRTH:  1961/03/03   DATE OF ADMISSION:  05/09/2005  DATE OF DISCHARGE:                                HISTORY & PHYSICAL   The patient is a 50 year old female with a history of end-stage renal  disease on hemodialysis at [facility] on Tuesday,  Thursday, and Saturday. She had a right groin catheter placed by Dr. Oneida Alar  on Apr 23, 2005, and has reported feeling weak and had complained of malaise  since that time. She presented to the emergency room today complaining of  similar symptoms as well as fever, chills, and anorexia. Therefore,  nephrology was asked to see her for further evaluation. She was found to  have a tender femoral catheter with exudate and is now being admitted for  further evaluation and treatment.   PAST MEDICAL HISTORY:  ESRD, hypertension, diabetes, cerebral tumor cerebri,  obstructive sleep apnea, hyperparathyroidism, failed AV access, anemia,  morbid obesity, history of Methicillin Resistant  Staphylococcus aureus  catheter infection in the past, status post bilateral tubal ligation.   SOCIAL HISTORY:  She lives with her sister who does not smoke or consume  alcoholic beverages.   ALLERGIES:  She is allergic IODINE, IV DYE possibly. Needs premedication  with Benadryl because of porcine heparin.   FAMILY HISTORY:  Noncontributory.   PHYSICAL EXAMINATION:  VITAL SIGNS: Temperature is 97.4, heart rate 100,  blood pressure 87/34.  GENERAL: An acutely appearing African American female. She is morbidly  obese.  HEENT: Round face, short neck.  CHEST: A PermCath in the left chest.  LUNGS: Clear anteriorly.  HEART: Regular rate and rhythm.  ABDOMEN: Tender in the lower area, right greater than left. There is  tenderness in the right lateral side of the abdomen and there are no  peritoneal signs and no masses.  EXTREMITIES: The right groin is tender. A PermCath is present with white  exudate drainage and there is significant lateral tenderness of the thigh  and groin area.  NEUROLOGIC: No obvious focalities.   LABORATORY DATA:  Potassium 5.8. White count 15,900 with left shift.  Hemoglobin 13.9 gm.   ASSESSMENT:  1.  Catheter sepsis secondary to the right groin cuffed and tunneled      catheter.  2.  Catheter to left chest.  3.  End-stage renal disease.  4.  Morbid obesity with complications.  5.  Diabetes mellitus.  6.  Hypotension, chronic.   PLAN:  1.  Blood cultures.  2.  Empiric vancomycin and Fortaz.  3.  CVTS to see.  4.  Culture of exudate.       ACP/MEDQ  D:  05/09/2005  T:  05/09/2005  Job:  [id number]

## 2011-04-27 NOTE — H&P (Signed)
NAMEANGIA, WHEELESS               ACCOUNT NO.:  [id number]   MEDICAL RECORD NO.:  [id number]          PATIENT TYPE:  INP   LOCATION:                               FACILITY:  [facility]   PHYSICIAN:  Donato Heinz, M.D.DATE OF BIRTH:  11/09/1969   DATE OF ADMISSION:  08/28/2005  DATE OF DISCHARGE:                                HISTORY & PHYSICAL   REASON FOR ADMISSION:  Hypotension and fever.   HISTORY OF PRESENT ILLNESS:  [name] is a 50 year old African-American  female with a past medical history significant for end-stage renal disease  (currently on hemodialysis at [facility] on Tuesdays,  Thursdays, and Saturdays), history of insulin-dependent diabetes mellitus,  morbid obesity, and chronic hypotension who presented to [facility] today in her usual state of health.  Nearing the end of her  hemodialysis treatment patient began to have chills and rigors.  She had a  fever spike to 101.6.  She did feel nauseous with emesis x1.  At that time  blood cultures were drawn and vancomycin 2 g and Fortaz 2 g was administered  IV with her hemodialysis treatment.  Patient continued to feel bad.  She was  given 1700 of normal saline for low blood pressure; however, blood pressure  stayed systolically between [id number].  Patient stated she felt too weak to  stand up.  Of note, patient had a recent admission on May 31 of this year  for MRSA sepsis infection secondary to right groin Perm-Cath.  That was  exchanged at that time.  Patient currently has a new right Perm-Cath that  was placed during that admission.  After the May 2006 admission patient was  admitted on July 20 through July 25 of 2006 for another MRSA bacteremia  secondary to femoral catheter which was removed July 21 and replaced July 24  of 2006 in right groin.  There has been no infection since that time.  Patient currently denies chest pain or shortness of breath.  She denies  abdominal  pain or changes in bowel habits.   PAST MEDICAL HISTORY:  1.  End-stage renal disease that was associated with pregnancy toxemia and      uncontrolled hypertension.  She has been on dialysis since November of      1991.  2.  Anemia of chronic disease.  3.  Secondary hyperparathyroidism status post parathyroidectomy in 1994.  4.  Morbid obesity.  5.  History of bilateral carpal tunnel syndrome.  6.  History of sleep apnea.  7.  History of severe hypotension.  8.  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  9.  History of gastroesophageal reflux disease.  10. History of MRSA bacteremia July 20 through July 25 of 2006.   ALLERGIES:  PORT HEPARIN.   MEDICATIONS:  1.  Flovent two puffs b.i.d.  2.  Nephro-Vite daily.  3.  PhosLo 667 mg five with meals  t.i.d.  4.  Lantus 12 units q.h.s.  5.  Reglan 10 mg one p.o. t.i.d. a.c. and h.s.  6.  Zocor 40 mg daily.  7.  Trusopt 2% eye drops as directed.  8.  Xalatan eye drops 0.005% as directed.  9.  Elavil 10 mg p.o. q.h.s.  10. Actos 45 mg p.o. daily a.c.  11. Midodrine 10 mg p.o. t.i.d.   FAMILY HISTORY:  Noncontributory.   SOCIAL HISTORY:  Patient currently lives in [place] with her husband.  She dialyzes at [facility].  She denies alcohol or  tobacco use.   REVIEW OF SYSTEMS:  Please see HPI.   PHYSICAL EXAMINATION:  VITAL SIGNS:  Temperature 101.6, pulse 100, blood  pressure 68/37.  GENERAL:  [name] is a 50 year old African-American female seen after  hemodialysis today.  She is alert and oriented x3.  She does appear ill  today.  HEENT:  Pupils are equal, round, and reactive to light and accommodation.  Extraocular movements are intact bilaterally.  Septum is midline.  Oral  cavity is without lesions.  NECK:  Supple.  No JVD is noted.  CHEST:  Decreased breath sounds bilateral  bases.  Otherwise, clear to  auscultation.  HEART:  Regular rate and rhythm.  S1 and S2 are heard.  There are no gallops  or rubs noted.  ABDOMEN:  Obese.  Positive bowel signs.  Soft.  Nontender to palpation.  No  rebounding or guarding noted.  EXTREMITIES:  Trace bilateral lower extremity edema.  Right femoral  permanent catheter with dark bloody bandages, however, no pus noted and no  signs of infection.   LABORATORIES:  Pending.   IMPRESSION:  1.  Chronic hypotension with acute onset.  We will admit patient as a direct      admit from [facility] to 2900.  We will administer pressors      if needed.  However, at time of transport to hospital patient's systolic      blood pressure is in the 80s.  Patient will be worked up for sepsis to      include blood cultures, chest x-ray, and urinalysis.  Patient was given      vancomycin 2 g as well as 2 g of Fortaz in the dialysis unit.  We will      continue to do this therapy until results of blood cultures are      available.  We will also check a cortisol level.  Patient will continue      her midodrine as directed.  2.  Fever of unknown origin.  Differential diagnosis would include sepsis      from a right femoral permanent catheter with patient's recent history of      MRSA x2 in the last six months.  However, see #1 to check chest x-ray      and urinalysis.  3.  Insulin-dependent diabetes mellitus.  Patient will continue on her oral      medications.  We will do sliding scale insulin for now.  We will hold      her Lantus secondary to infection for now and monitor her blood sugars      routine.  4.  End-stage renal disease.  Patient received her full treatment at [facility] today.  We      will continue her on her regularly scheduled day on Thursday.  5.  Secondary hyperparathyroidism status post parathyroidectomy.  Patient     will continue on her phosphorous binders while eating.       Leafy Kindle, PA    ______________________________  Donato Heinz, M.D.    MY/MEDQ  D:  08/28/2005  T:  08/28/2005  Job:  [id number]

## 2011-04-27 NOTE — H&P (Signed)
[name], [name]               ACCOUNT NO.:  [id number]   MEDICAL RECORD NO.:  [id number]          PATIENT TYPE:  EMS   LOCATION:  MAJO                         FACILITY:  [facility]   PHYSICIAN:  Louis Meckel, M.D.DATE OF BIRTH:  [birth year]   DATE OF ADMISSION:  08/21/2006  DATE OF DISCHARGE:                                HISTORY & PHYSICAL   NOTE:  It should be noted that this patient has three medical record  numbers, and we are going to try to get them consolidation.   HISTORY OF PRESENT ILLNESS:  Ms. [name] is a 50 year old black female with a  past medical history significant for end-stage renal disease, Tuesday,  Thursday, Saturday at the [facility], diabetes mellitus,  history of pulmonary embolism, on chronic Coumadin therapy, and history of  access difficulty, now dialyzing via left femoral catheter.  Patient  presented to the emergency department last night with a complaint of mostly  right shoulder pain, possibly extending into her chest.  She reports sudden  onset of that pain approximately two weeks ago now.  It seems to be getting  worse and limiting her range of motion secondary to pain.  Evaluation in the  ER last night revealed a shoulder film which showed a prominent subacromial  space, questionable joint effusion.  Patient was also found to have an INR  of 1.6, which is slightly subtherapeutic, so was given 130 mg of subcu  Lovenox and set up for a vascular study today.  The vascular study today  revealed that there appeared to be a DVT in the right proximal jugular vein.  Other veins were not commented on.  Patient really does not have significant  swelling.  Her main complaint is the pain and decreased range of motion  secondary to pain.  Her arm is currently in a sling.   PAST MEDICAL HISTORY:  1. End-stage renal disease, at [facility] Tuesday, Thursday, Saturday.  2. Diabetes mellitus.  3. History of PE, on Coumadin.  4. Access  difficulty.  5. Morbid obesity.  6. Secondary hyperparathyroidism, status post parathyroidectomy.  7. Chronic low blood pressure, on midodrine.  8. Coronary artery disease with stents, per the patient.   MEDICATIONS:  1. Nephro-Vite 1 daily.  2. Coumadin 5 mg a day.  3. Os-Cal 5 with meals.  4. Lantus insulin 45 units a day.  5. Actos 45 mg a day.  6. Midodrine 10 mg t.i.d.  7. Short acting insulin.   ALLERGIES:  CONTRAST.  She usually premedicates with Benadryl.   SOCIAL HISTORY:  There is no tobacco, no alcohol, no drug use.  Patient is  married.  Activity is currently limited by arm and shoulder pain.   FAMILY HISTORY:  Noncontributory.   REVIEW OF SYSTEMS:  Positive for right shoulder pain, decreased range of  motion to the right shoulder, shortness of breath, and chest pain which is  reproducible.  No fevers, chills, no abdominal pain.  No nausea, vomiting,  no diarrhea.  No constipation.  No pleuritic chest pain.  Positive edema,  otherwise review of systems is  negative.   PHYSICAL EXAMINATION:  VITAL SIGNS:  Patient is afebrile.  Blood pressure is  130/70, heart rate is 70, respirations are normal.  GENERAL:  This is an obese black female in some distress secondary to pain.  The right arm is in a sling.  HEENT:  Pupils are equal, round and reactive to light.  Extraocular motions  are intact.  Mucous membranes moist.  NECK:  Really unable to tell if there is JVD due to the patient's obesity.  There is no evidence of acute air.  Unable to appreciate a difference  between the right and left side in terms of neck size.  Her neck itself is  not extraordinarily painful.  LUNGS:  Decreased breath sounds at the bases with poor effort.  She has  reproducible chest pain.  CARDIOVASCULAR:  Regular rate and rhythm.  ABDOMEN:  Positive bowel sounds.  Soft, obese.  EXTREMITIES:  Lower extremities reveal no edema.  The right upper extremity  is very painful with movement of arm  with significant swelling that you  would see with vascular congestion.   SHOULDER FILM:  A prominent subacromial space, question effusion.  Vascular  lab study equals internal jugular DVT.  INR yesterday was 1.6.   ASSESSMENT:  A 50 year old black female with end-stage renal disease and  diabetes with right shoulder pain.  1. Right shoulder pain:  Given the pain and decreased range of motion,      point tenderness, and normal joint on shoulder film, I suspect      musculoskeletal pain.  I think that the deep venous thrombosis is an      incidental finding; however, given her risk for recurrent pulmonary      embolus, will admit and anticoagulate, then obtain a CT scan of the      questionable area to take a closer look at joint and rule out any      significant deep venous thrombosis.  2. Diabetes mellitus:  Will follow CBG, decrease Lantus dose since she is      in the hospital, and use sliding-scale insulin.  3. End-stage renal disease:  Continue her Tuesday, Thursday, Saturday      schedule via a femoral catheter.  4. Hypothyroidism:  Continue midodrine.  5. Will use Vicodin for pain control.           ______________________________  Louis Meckel, M.D.     KAG/MEDQ  D:  08/21/2006  T:  08/21/2006  Job:  [id number]

## 2011-04-27 NOTE — Op Note (Signed)
NAME:  Holly, [name] NO.:  [id number]   MEDICAL RECORD NO.:  [id number]                    PATIENT TYPE:   LOCATION:                                       FACILITY:  [facility]   PHYSICIAN:  Rosetta Posner, M.D.                 DATE OF BIRTH:  03-26-1961   DATE OF PROCEDURE:  05/17/2003  DATE OF DISCHARGE:  05/17/2003                                 OPERATIVE REPORT   PREOPERATIVE DIAGNOSIS:  End-stage renal disease with occluded left femoral  loop AV Gore-Tex graft.   POSTOPERATIVE DIAGNOSIS:  End-stage renal disease with venous outflow  stenosis with patent femoral loop graft.   PROCEDURE:  Revision of venous anastomosis of left femoral loop graft.   SURGEON:  Rosetta Posner, M.D.   ASSISTANT:  Sueanne Margarita, P.A.   ANESTHESIA:  General endotracheal anesthesia.   COMPLICATIONS:  None.   DESCRIPTION OF PROCEDURE:  The patient was taken to the operating room and  placed in the supine position where the area of the left groin was prepped  and draped in the usual sterile fashion.  An incision was made over the  previous venous anastomosis and carried down to the __________  to saphenous  vein anastomosis.  The vein was occluded and the graft was opened through  the old anastomosis.  There was arterial in flow and this was flushed with  saline and re-occluded.  The patient has a heparin allergy and was not given  heparin.  There was severe stenosis at the venous anastomosis and for this  reason the old venous anastomosis was excised.  The vein itself was quite  large above this and the patient had a central venogram three days prior to  this procedure in the radiology department showing no evidence of stenosis.  There was a venous angioplasty three days ago in the radiology department.  A new venous graft, a  7 mm Gore-Tex was brought onto the field and was  spatulated and sewn end to end using a running 6-0 Prolene suture and then  end to end to  the old graft with running 6-0 Prolene suture.  __________  good flow was noted.  The wound was irrigated with saline and hemostasis  accomplished with electrocautery.  The wound was closed with 2-0 Vicryl in  the subcutaneous tissue and the skin was closed with 3-0 subcuticular Vicryl  stitch.  The patient did have a chronic draining area over a de-  functionalized loop in her left thigh.  This was cultured.  The patient will  be seen back in the office in ten days for further evaluation of this and  probable eventual removal of this second infection portion.  Rosetta Posner, M.D.    TFE/MEDQ  D:  05/17/2003  T:  05/17/2003  Job:  [id number]

## 2011-04-27 NOTE — H&P (Signed)
NAME:  [name], [name]                         ACCOUNT NO.:  [id number]   MEDICAL RECORD NO.:  [id number]                   PATIENT TYPE:  INP   LOCATION:                                       FACILITY:  [facility]   PHYSICIAN:  Sol Blazing, M.D.             DATE OF BIRTH:  February 18, 1961   DATE OF ADMISSION:  06/17/2004  DATE OF DISCHARGE:                                HISTORY & PHYSICAL   REASON FOR ADMISSION:  Lightheadedness and severe weakness after dialysis  earlier this morning.   HISTORY OF PRESENT ILLNESS:  The patient is a 51 year old black female with  end-stage renal disease, morbid obesity and chronic hypotension who felt bad  after hemodialysis this morning.  She had progressed to having difficulty  ambulating and lightheadedness and severe weakness.  She was brought to the  emergency room by her husband this afternoon.  In the emergency room, her  temperature was 104 degrees Fahrenheit, and the blood pressure readings were  reading falsely high initially.  The readings such as 160/140 which I  believe was not accurate initially.  When I examined the patient, she was  very lethargic, falling asleep and had thready pulses.  We were able to  obtain a blood pressure on the right wrist of 30 systolic over palpable.  She had a thready femoral pulse.  We gave her a bunch of fluid and dopamine,  and she seemed to be waking up some now.   PAST MEDICAL HISTORY:  1. Significant for OSA on CPAP of 92.  2. End-stage renal disease dialyzed Tuesday, Thursday and Saturday.  3. Hypotension, chronic.  4. Secondary hyperparathyroidism.  5. Anemia.  6. Chronic multiple axis failures.   ALLERGIES:  IV DYE AND HEPARIN.   SOCIAL HISTORY:  She lives with her husband.  No tobacco or alcohol use.   MEDICATIONS:  1. Midodrine 10 t.i.d. and before dialysis.  2. Reglan 10 q.h.s. and a.c.  3. Benadryl p.r.n.  4. Naprosyn p.r.n.  5. Lantus of uncertain dose.  6. She also is probably taking  either PhosLo or calcium carbonate but can     not provide a history right now.   REVIEW OF SYSTEMS:  GENERAL:  She did have some fever and chills earlier  today.  No night sweats or weight loss.  ENT:  Denies hearing loss, visual  change, sore throat, difficulty swallowing.  CARDIORESPIRATORY:  Denies  chest pain, shortness of breath or productive cough.  GI:  Denies nausea,  vomiting, abdominal pain or diarrhea.  GU:  She does not make much urine.  MUSCULOSKELETAL:  Denies arthralgia, myalgia or edema.  NEUROLOGIC:  Denies  focal numbness or weakness, history of stroke, TIA or seizure.   PHYSICAL EXAMINATION:  GENERAL:  The patient is obese.  She is extremely  lethargic but arouses.  When she does arouse, she is fully oriented x3.  She  falls right back asleep.  VITAL SIGNS:  The right femoral pulse is thready but present.  I estimate  her systolic pressure between 50 and 60 by the femoral pulse.  The blood  pressure in the right radial wrist, Doppler with a right forearm cuff was  initially 30 systolic and increased to 45 systolic after one liter of fluid  bolus.  Temperature is 104.8, and then down to 99.3 on recheck.  The blood  pressures that were recorded by the manual cuff earlier were completely  inaccurate I believe.  Numbers such as 137/109.  There is no meningismus.  CHEST:  The chest is clear throughout.  NECK:  Right internal jugular hemodialysis catheter with scan pus at the  exit site which was cultured.  HEART:  Regular rate and rhythm without rub or gallop.  ABDOMEN:  Soft, markedly obese, nontender.  Active bowel sounds.  EXTREMITIES:  There is a right femoral AV graft with a good bruit by  Doppler.  No signs of AV graft infection.  No peripheral edema in the lower  extremities.  No significant ulceration or gangrene on the legs.   LABORATORY DATA:  White blood count 20.8 thousand.  Hematocrit 34%,  platelets normal.  Total bilirubin 1.5.  Albumin 3.0.  Calcium 7.9.   Sodium  133, potassium 3.5.  BUN 10, creatinine 5.8.  The pH is 7.3.   IMPRESSION:  1. Septic shock probably due to catheter-related sepsis.  She does have     scant pus at the exit site.  2. Acute-on-chronic hypotension.  She does take Midodrine chronically for     hypotension.  3. End-stage renal disease dialyzed earlier today.  4. Obstructive sleep apnea on CPAP at night.  5. Secondary hyperparathyroidism.  6. Anemia.   PLAN:  1. Admit to ICU.  2. IV fluids, dopamine and possible A line and IV antibiotics stat with     Zosyn and vancomycin.  3. Blood cultures and catheterization site culture done.  4. I&O's, catheter UA, C&S.   PROGNOSIS:  Guarded.                                                Sol Blazing, M.D.    RDS/MEDQ  D:  06/17/2004  T:  06/18/2004  Job:  [id number]

## 2011-04-27 NOTE — Discharge Summary (Signed)
[name], [name]               ACCOUNT NO.:  [id number]   MEDICAL RECORD NO.:  [id number]          PATIENT TYPE:  INP   LOCATION:  5522                         FACILITY:  [facility]   PHYSICIAN:  Windy Kalata, M.D.DATE OF BIRTH:  1961-06-02   DATE OF ADMISSION:  12/22/2006  DATE OF DISCHARGE:  12/27/2006                               DISCHARGE SUMMARY   DISCHARGE DIAGNOSES:  1. Methicillin-resistant Staphylococcus aureus PermCath related      bacteremia.  2. End-stage renal disease.  3. Obstructive sleep apnea.  4. Hypotension.  5. Secondary hyperparathyroidism.  6. Anemia of chronic disease.  7. Insulin-dependent diabetes mellitus.   PROCEDURES:  1. December 25, 2006 - superior veno cava gram.  Impression:      a.     Patency of the right subclavian vein, SVC, and SVC stent.      b.     Antegrade flow and patent bilateral jugular venous system on       survey ultrasound as noted.  2. December 25, 2006 - removal of right femoral Diatech catheter by      interventional radiology.  3. December 27, 2006 - placement of tunneled dialysis catheter.      Impression:  Successful placement of a tunneled dialysis catheter      through the right external jugular vein and through the SVC stent.      The tips of the catheter in the right atrium and ready to be used.  4. Hemodialysis.   CONSULTATIONS:  CVTS.   HISTORY OF PRESENT ILLNESS:  [name] is a 50 year old, African  American female, who was in her usual state of health and underwent  dialysis on the day prior to admission, and presents to the emergency  room with acute onset of fever, chills, headache, nausea, vomiting and  diarrhea since the middle of the night.  She states she is also short of  breath with mild orthopnea, and has a dry cough.  In the emergency room,  her temperature is 100.3.  Her heart rate is 135.  Her systolic pressure  is 80 with a baseline of 100.  Her O2 sats are 90% on 2 liters nasal  cannula.   Her current dialysis access to the right femoral Diatech catheter, which  was placed in October of 2007 by CVTS.  She had underwent a TP infusion  at [facility] two days ago on December 20, 2006 due to the poor blood  flows at the kidney center.  The patient denies any exit site issues,  such as draining, bleeding or pain.   ADMISSION LABS:  WBC 13,400 with 84% neutrophils.  Hemoglobin 14.3,  platelets 208,000.  Potassium 4, creatinine 6, and glucose 166.   ADMISSION CHEST X-RAY:  Shows vascular congestion and cardiomegaly.   HOSPITAL COURSE:  1. MRSA permanent catheter related bacteremia.  The patient was      admitted.  This is actually her third occurrence of MRSA      bacteremia.  It was an intent to pull the right femoral catheter,      which we  did; however, the patient underwent a venogram to      determine if catheter could be replaced in the chest prior to      pulling this.  The impressions are listed above.  The patient did      have a patent IJ and SVC on venogram; therefore, underwent a      replacement in interventional radiology as stated above.  The      patient was initially started on vancomycin and Fortabs; however,      Fortabs was discontinued once sensitivities were recovered.  The      patient was afebrile at the date of discharge, and her white count      had returned to normal at 9.2.  There was no further intervention.  2. End-stage renal disease.  The patient underwent usual hemodialysis.      She did receive a holiday with the catheter out, and replaced.      Therefore, she was off schedule one day secondary to this      arrangement.  3. Obstructive sleep apnea.  The patient remained on her CPAP this      admission with no intervention.  4. Hypotension.  The patient's chronic hypotension returned to      baseline prior to discharge.  Her systolic blood pressure was      stable in the 100s.  She remained on midodrine the entire      admission.  5.  Secondary hyperparathyroidism.  The patient's vitamin D was      discontinued secondary to an intact PTH of less than 100.  She did      continue on her phosphate binders.  6. Anemia of chronic disease.  We resumed the patient's outpatient      dose of Epogen with a stable hemoglobin.  7. Diabetes mellitus.  The patient continued on Actos and sliding      scale insulin for coverage.  Her CBGs ranged between 118 to 152,      and remained stable.   DISCHARGE INSTRUCTIONS:  The patient is instructed to resume her  outpatient medicines as before without any changes made today.  She may  increase her activity slowly.  She should resume her renal diet with a  1200 cc fluid restriction per day.   HEMODIALYSIS INSTRUCTIONS:  The patient will resume hemodialysis on her  usual day.  She now has a new right internal jugular dialysis catheter.  She should remain on IV vancomycin 1 gram through hemodialysis treatment  through January 12, 2007.  The patient is also instructed that she  should have no showers while the permanent catheter is in place, and she  must keep the catheter dry.      Leafy Kindle, PA    ______________________________  Windy Kalata, M.D.    MY/MEDQ  D:  02/18/2007  T:  02/18/2007  Job:  [id number]   cc:   [facility]  [facility]

## 2011-04-27 NOTE — H&P (Signed)
[name], [name]               ACCOUNT NO.:  [id number]   MEDICAL RECORD NO.:  [id number]          PATIENT TYPE:  INP   LOCATION:  5029                         FACILITY:  [facility]   PHYSICIAN:  Maudie Flakes. Hassell Done, M.D.   DATE OF BIRTH:  31-May-1961   DATE OF ADMISSION:  11/27/2006  DATE OF DISCHARGE:                              HISTORY & PHYSICAL   HISTORY OF PRESENT ILLNESS:  Ms. Campbel is a 50 year old black woman who  came into the ER today with chills and vomiting since this morning.  She  is on hemodialysis Tuesday, Thursday, Saturday at the [facility].  She had dialysis yesterday without problems.  In the  ER she had a temperature of 99.9 with continued chills.  She is admitted  for IV antibiotics and observation.   PAST MEDICAL HISTORY:  1. ESRD on dialysis since 1991 (hypertension, proteinuria, toxemia).      She has had numerous failed vascular accesses.  Now uses right      femoral vein catheter.  2. Parathyroidectomy (4-gland) March 1994 with implant remnant gland      in left brachial radialis muscle.  3. Pseudotumor cerebri (seen in past by Dr. Jannifer Franklin).  4. Type 2 DM.  5. 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  6. MRSA bacteremia in the past.  7. Obstructive sleep apnea (see Dr. Baird Lyons).  8. Adhesive capsulitis, right shoulder with rotator cuff tear (see Dr.      Justice Britain).  Injected October 2007.   MEDICATIONS PRIOR TO ADMISSION:  1. Omeprazole 20/DSSI.  2. ASA 81/D.  3. Zocor 40/D.  4. Xalatan 0.005% 1 drop OU b.i.d.  5. Trusopt 2% 1 drop OU b.i.d.  6. Midodrine 10 t.i.d.  7. Nephro-Vite q. a.m.   REVIEW OF SYSTEMS:  No cough, no purulent sputum, no recent URI, no  dysuria.  No gross hematuria, no hemoptysis, no tachypnea, no angina, no  claudication, no loss of consciousness, nor cold or heat intolerance.   SOCIAL HISTORY:  She lives with her sister.  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  She  does not smoke cigarettes or drink alcohol or use illegal drugs.   FAMILY HISTORY:  Her father died of ? when she was a baby, mother died  in her mid 10's of CVAs.  She has 2 daughters who are healthy.   PHYSICAL EXAMINATION:  GENERAL:  She is awake, she has chills, she is  courteous, she is talking with her daughter.  VITAL SIGNS:  Temperature 99, pulse 110, respirations 24, blood pressure  112/78.  CHEST:  Clear.  HEART:  No rub.  ABDOMEN:  Protuberant, nontender.  Bowels sounds present.  EXTREMITIES:  Numerous old nonfunctioning access sites in arms  bilaterally.  They are nontender.  No evidence of fluctuance around  them.  Cath is present in right femoral vein.  Slight tenderness over  exit site, no pus seen.  NEURO:  Right-handed strength equal sensation intact.   LABORATORY DATA:  Pending.  Hemoglobin  13.1 on November 21, 2006.  PTH  is 10.9 in October 2007.  Chest x-ray shows atelectasis in bases  bilaterally and mild vascular congestion.   IMPRESSION:  1. Chills, temperature 99.9, probable infected hemodialysis catheter.  2. End-stage renal disease, dialysis Tuesday, Thursday, Saturday.  3. Diabetes mellitus type 2.  4. Right shoulder rotator cuff tear plus adhesive capsulitis.   PLAN:  1. Culture blood, vancomycin, Tressie Ellis.  Discontinue catheter if blood      culture positive for Staph.  Check PA and lateral chest x-ray after      dialysis tomorrow.  2. Tuesday, Thursday, Saturday dialysis.  Continue to Epo at 15,000      (lower dose).  3. SSI t.i.d. 4.  No suggestions currently.           ______________________________  Maudie Flakes. Hassell Done, M.D.     RFF/MEDQ  D:  11/27/2006  T:  11/28/2006  Job:  [id number]

## 2011-04-27 NOTE — Op Note (Signed)
[name], [name]               ACCOUNT NO.:  [id number]   MEDICAL RECORD NO.:  [id number]          PATIENT TYPE:  OIB   LOCATION:  2875                         FACILITY:  [facility]   PHYSICIAN:  Judeth Cornfield. Scot Dock, M.D.DATE OF BIRTH:  02/04/1961   DATE OF PROCEDURE:  01/11/2005  DATE OF DISCHARGE:                                 OPERATIVE REPORT   PREOPERATIVE DIAGNOSIS:  Left clotted upper arm AV graft.   POSTOPERATIVE DIAGNOSIS:  Left clotted upper arm AV graft.   PROCEDURE:  1.  Thrombectomy, revision of left upper arm arteriovenous graft.  2.  Ultrasound of left internal jugular vein.  3.  Placement of a 32 cm left internal jugular Diatek catheter.   SURGEON:  Judeth Cornfield. Scot Dock, M.D.   ASSISTANT:  Jacinta Shoe, P.A.   ANESTHESIA:  General.   INDICATIONS:  This is a 50 year old woman who had had bilateral upper  extremities grafts on multiple occasions with multiple thrombectomies.  She  had had bilateral thigh grafts, and most recently, 2 months ago, she had a  last-ditch effort, a left upper arm graft as a central venogram had shown a  patent axillary vein on the left. This was placed very high on the axilla  and to a somewhat sclerotic axillary vein, based on the operative report.  She presents with a clotted graft.   Of note, she has problems with hypertension.  In addition, she APPARENTLY  HAS A DYE ALLERGY AND A HEPARIN ALLERGY, ALTHOUGH SHE HAS RECEIVED HEPARIN  IN THE PAST WITH BENADRYL.   TECHNIQUE:  The patient was brought to the operating room, received a  general anesthetic. The left upper extremity was prepped and draped in the  usual sterile fashion.  The incision in the axilla was opened transversely  and the venous limb of the graft dissected free. I dissected up to where it  was anasmosed to the sclerotic vein. The vein more proximally was small, and  I did not think jumping higher would be technically feasible.  Graft  thrombectomy was  performed, after the graft was divided and the venous plug  was retrieved. The venous anastomosis did take a 5 mm dilator, though it was  somewhat tight.  A graft thrombectomy was performed and the arterial plug  was retrieved.  The graft was flushed with saline.   Getting ready to sew the graft back together, it was noted that it was  redundant, and also that a clot had already formed.  Therefore, I elected to  use some heparin after the patient was premedicated with Benadryl; we only  used 5000 units in this morbidly obese patient.  The graft thrombectomy was  repeated until no further clot was retrieved. The graft was flushed with  heparinized saline and clamped at both ends. The redundant segment was  excised and the graft was sewn back end-to-end with continuous 6-0 Prolene  suture.  Of note, the graft was patent, although the thrill was somewhat  weak as she had problems with low blood pressure.   I elected therefore to place a catheter as a  back-up, as I was not  optimistic that this graft would work long-term.  Patient had an EJ IV  placed by anesthesia preoperatively, so I elected to use a left-sided  approach.  The ultrasound scanner was used to identify the left internal  jugular vein, which appeared to be patent. The neck and upper chest were  prepped and draped in the usual sterile fashion.  After the skin was  incised, the left IJ was cannulated after it had been found with a finder  needle. The  guide wire was introduced into the left IJ.  Of note, I had a difficult time  manipulating the wire down into the superior vena cava and I had to use an  end-hole catheter, and then exchange the J-wire for an angled Glidewire  which I was able to manipulate down into the right atrium.  I then advanced  the end-hole catheter down into the right atrium and exchanged the Glidewire  for a J-wire.  The tract over the wire was dilated, and then the dilator and  peel-away sheath were  passed over the wire. The dilator was then removed.   A 32 cm catheter was threaded over the wire through the peel-away sheath and  down into the right atrium. The peel-away sheath was then removed.  The  catheter was positioned in the right atrium. The exit site for the catheter  was selected, and the skin anesthetized between the 2 areas.  The catheter  was then brought through the tunnel, cut to the appropriate length, and the  distal ports were attached.  Both ports withdrew easily, were then flushed  with heparinized saline and then filled with concentrated heparin. The  catheter was secured at its exit site with  3-0 nylon suture.  The IJ cannulation site was closed with a 4-0  subcuticular stitch.  Sterile dressing was applied.  The patient tolerated  the procedure well, was transferred to the recovery room in satisfactory  condition.  All sponge and needle counts were correct.      CSD/MEDQ  D:  01/11/2005  T:  01/11/2005  Job:  [id number]

## 2011-04-27 NOTE — H&P (Signed)
NAME:  [name], [name]                         ACCOUNT NO.:  [id number]   MEDICAL RECORD NO.:  [id number]                   PATIENT TYPE:  OIB   LOCATION:  5501                                 FACILITY:  [facility]   PHYSICIAN:  Vickki Hearing, M.D.                  DATE OF BIRTH:  January 08, 1961   DATE OF ADMISSION:  01/25/2004  DATE OF DISCHARGE:                                HISTORY & PHYSICAL   No dictation.                                                Vickki Hearing, M.D.    AK/MEDQ  D:  01/26/2004  T:  01/26/2004  Job:  [id number]

## 2011-04-27 NOTE — Op Note (Signed)
NAMECELLIE, [name]               ACCOUNT NO.:  [id number]   MEDICAL RECORD NO.:  [id number]          PATIENT TYPE:  OIB   LOCATION:  2899                         FACILITY:  [facility]   PHYSICIAN:  Rosetta Posner, M.D.    DATE OF BIRTH:  [birth year]   DATE OF PROCEDURE:  10/09/2004  DATE OF DISCHARGE:                                 OPERATIVE REPORT   PREOPERATIVE DIAGNOSIS:  End stage renal disease.   POSTOPERATIVE DIAGNOSIS:  End stage renal disease.   PROCEDURE:  Placement of new left upper arm arteriovenous Gore-Tex graft.   SURGEON:  Rosetta Posner, M.D.   ASSISTANT:  John Giovanni, P.A.-C.   ANESTHESIA:  General endotracheal anesthesia.   COMPLICATIONS:  None.   DISPOSITION:  To the recovery room stable.   INDICATIONS FOR PROCEDURE:  The patient is a 50 year old morbidly obese  black female with end stage renal disease.  She has had multiple failed  accesses in all four extremities.  She had recently had failure of a right  femoral loop graft which had been placed in June 2005.  Venograms revealed  that she did have a patent axillary vein on the left.  This had been deemed  not reconstructable in the past, but with a patent vein, it was felt an  attempt at maintaining graft patency would be done.   PROCEDURE:  The patient was taken to the operating room and placed on the  operating table where the area of the left arm and left axilla were prepped  and draped in the usual sterile fashion.  An incision was made over the  axilla and carried down to isolate the axillary vein and this was somewhat  sclerotic from the prior anastomosis and this was exposed high in the axilla  and was patent and an approximately 4-5 mm diameter vein.  A separate  incision was made over the brachial artery of the axilla.  The artery was  encircled with a blue vessel loop.  A tunnel was created from the level of  the antecubital space to the axilla.  A 6 mm standard wall stretch graft was  brought  through the tunnel.  The axillary vein was occluded proximally and  distally, opened with an 11 blade, and extended with Potts scissors.  The  graft was spatulated and sewn end-to-side to the vein with a running 6-0  Prolene suture.  The anastomosis was tested and found to be adequate.  Next,  the brachial artery was occluded proximally and distally and was opened with  an 11 blade and sewn with Potts scissors.  The graft was cut to the  appropriate length and was sewn end-to-side to the artery with a running 6-0  Prolene suture.  The clamps were removed and a good thrill was  noted.  The wounds were irrigated with saline and hemostasis was obtained  with electrocautery.  The wounds were closed with 3-0 Vicryl in the  subcutaneous and subcuticular tissue.  Benzoin and Steri-Strips were  applied.  The patient did have labile blood pressure with the pressure in  the  low 70s at the completion of the operation.      Todd   TFE/MEDQ  D:  10/09/2004  T:  10/09/2004  Job:  [id number]

## 2011-04-27 NOTE — Op Note (Signed)
[name], [name]               ACCOUNT NO.:  [id number]   MEDICAL RECORD NO.:  [id number]          PATIENT TYPE:  OIB   LOCATION:  2899                         FACILITY:  [facility]   PHYSICIAN:  Jessy Oto. Fields, MD  DATE OF BIRTH:  Mar 16, 1961   DATE OF PROCEDURE:  04/30/2005  DATE OF DISCHARGE:  04/30/2005                                 OPERATIVE REPORT   PROCEDURE:  1.  Insertion of right femoral Diatek catheter.  2.  Ultrasound of right groin.   PREOPERATIVE DIAGNOSIS:  End-stage renal disease.   POSTOPERATIVE DIAGNOSIS:  End-stage renal disease.   ANESTHESIA:  Local with IV sedation.   SURGEON:  Jessy Oto. Fields, MD   ASSISTANT:  Rocky Morel, RN   FINDINGS:  Fifty-five-centimeter Diatek catheter, right femoral vein.   OPERATIVE DETAILS:  After obtaining informed consent, the patient was taken  to the operating room.  The patient was placed in a supine position on the  operating table.  After adequate sedation, the patient's right groin was  prepped and draped in usual sterile fashion.  Local anesthesia was  infiltrated over the right femoral vein.  Using ultrasound guidance from the  [facility] ultrasound, the right femoral vein was cannulated successfully.  A  0.35 J-tipped guidewire was then threaded into the femoral vein and up into  the inferior vena cava under fluoroscopic guidance.  Next, sequential 12-,  14- and 16-French dilators with a peel-away sheath were placed over the  guidewire into the right femoral vein.  The guidewire and dilator were then  removed.  A 55-cm Diatek catheter was then threaded up into the inferior  vena cava under fluoroscopic guidance.  The catheter was checked under  fluoroscopy and found without any kinks in its course.  The catheter was  then tunneled subcutaneously out on the anterior thigh.  The catheter was  cut to length and the hub attached.  Catheter was noted to flush and draw  easily.  The catheter was sutured to the skin  with nylon sutures.  The  patient tolerated the procedure well and there were no immediate  complications.  Instrument, sponge and needle count was correct at the end  of the case.  The patient was taken to the recovery room in stable  condition.       CEF/MEDQ  D:  07/22/2005  T:  07/23/2005  Job:  [id number]

## 2011-04-27 NOTE — H&P (Signed)
NAMECHAMPAIGN, WANGELIN               ACCOUNT NO.:  [id number]   MEDICAL RECORD NO.:  [id number]          PATIENT TYPE:  INP   LOCATION:  [id number]                         FACILITY:  [facility]   PHYSICIAN:  Sol Blazing, M.D.DATE OF BIRTH:  Apr 14, 1961   DATE OF ADMISSION:  12/22/2006  DATE OF DISCHARGE:                              HISTORY & PHYSICAL   REASON FOR ADMISSION:  Fever and hypotension.   HISTORY OF PRESENT ILLNESS:  A 50 year old black female with end-stage  renal disease secondary to hypertension, chronic dialysis every Tuesday,  Thursday, Saturday with [facility], who was in her  usual state of health with an uneventful dialysis on the day prior to  admission and presents to the emergency room with acute onset of fever,  chills, headache, nausea, vomiting and diarrhea since the middle of the  night.  She states she is also a short of breath with mild orthopnea and  has a dry cough.  In the emergency room her temperature is 100.3, her  heart rate [id number], her systolic blood pressure is 80 with a baseline of  100.  Her O2 sats are 90% on 2 liters nasal cannula.   Her current dialysis access is a right femoral Diatek catheter which was  placed in [place] 2007 by CVTS.  She underwent tPA infusion at [facility] 2 days ago on January11,2008 due to poor blood flows at  the outpatient kidney center.  The patient denies any exit site issues  such as drainage, bleeding or pain.   ALLERGIES:  1. IV DYE.  2. IODINE.   CURRENT MEDICATIONS:  1. Prilosec 20 mg q.h.s.  2. Nephro-Vite vitamin daily.  3. Midodrine 10 mg t.i.d.  4. Zocor 40 mg daily.  5. Baby aspirin 81 mg daily.  6. Actos 45 mg daily.  7. Lantus 45 units subcutaneous q.h.s.  8. Trusopt 2% one drop both eyes b.i.d.  9. Xalatan 0.005% 1 drop both eyes b.i.d.   PAST MEDICAL HISTORY:  1. End-stage renal disease secondary to hypertension with a history of      pregnancy-related toxemia.  2. Status post remote parathyroidectomy in [phone] with left forearm      autotransplantation.  3. Pseudotumor cerebri followed by Dr. Jannifer Franklin in the past.  4. Type 2 diabetes mellitus.  5. Obesity.  6. Obstructive sleep apnea seen by Dr. Baird Lyons in the past.  7. 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  8. 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  9. Right shoulder rotator cuff tear and adhesive capsulitis followed      by Dr. Justice Britain since October2007.  10.Anemia of chronic disease.  11.Coronary artery disease.   SOCIAL HISTORY:  The patient is married, lives in [place], and is on  disability.  She does not use tobacco products, alcohol or any drug use.   FAMILY HISTORY:  Positive for hypertension and diabetes.   REVIEW OF SYSTEMS:  Chronic right shoulder pain, chronic  dry cough.  She  denies dysuria, abdominal pain, swelling of the legs, weight loss, loss  of appetite, chest pain, palpitations.   PHYSICAL EXAMINATION ON ADMISSION:  VITAL SIGNS:  Blood pressure 81/50,  heart rate is 130 and regular, respirations 24, temperature 100.3, O2  saturations 90% on 2 liters nasal cannula.  GENERAL:  An obese black female who is in moderate discomfort and mild  respiratory distress, but is awake, alert, appropriate, and oriented x3.  HEENT:  Normocephalic and atraumatic.  There is no facial edema or  droop.  Sclerae are anicteric.  Tympanic membranes are clear.  Posterior  pharynx is clear.  NECK:  Supple.  No adenopathy.  LUNGS:  Bibasilar crackling crackles with occasional respiratory wheeze.  HEART:  Tachycardiac, regular.  No rub or murmur audible.  ABDOMEN:  Obese, soft, nontender, nondistended.  No masses palpable.  EXTREMITIES:  The right femoral exit site is tender at the cuff with  deep palpation.  A tiny drop of pus was expressed and cultured.  There  is  trace pretibial edema.  No open wounds or sores noted.   LABORATORIES ON ADMISSION:  White count is 13,400 with 84% neutrophils,  8% lymphocytes, monos are too large to view.  Hemoglobin 14.3, platelets  208,000.  Sodium was 32, potassium 4.0, chloride 95, CO2 of 23, BUN 27,  creatinine 6.27, glucose 166.   CHEST X-RAY:  Vascular congestion and cardiomegaly.   ASSESSMENT/PLAN:  1. Fever, hypotension and leukocytosis.  Suspect catheter-related      sepsis with a long history of MRSA in the past.  Will pan culture      and cover empirically for now with vancomycin and Fortaz.  2. Pulmonary vascular congestion secondary to volume overload.  Will      do emergency hemodialysis today attempting aggressive      ultrafiltration, although her low blood pressure will be the      limiting factor.  3. End-stage renal disease.  Hemodialysis every Tuesday, Thursday,      Saturday at [facility].  4. Type 2 diabetes mellitus.  Continue Actos, Lantus and sliding scale      insulin.  5. Chronic hypotension.  Continue midodrine.  6. Obstructive sleep apnea.  7. Anemia of chronic disease.  Continue EPO at a lower dose due to      high hemoglobin.      [name], P.A.      Sol Blazing, M.D.  Electronically Signed    RRK/MEDQ  D:  12/22/2006  T:  12/22/2006  Job:  [id number]

## 2011-04-27 NOTE — Discharge Summary (Signed)
Holly, [name]               ACCOUNT NO.:  [id number]   MEDICAL RECORD NO.:  [id number]          PATIENT TYPE:  INP   LOCATION:  5503                         FACILITY:  [facility]   PHYSICIAN:  Sherril Croon, M.D.   DATE OF BIRTH:  1961/04/14   DATE OF ADMISSION:  06/28/2005  DATE OF DISCHARGE:  07/03/2005                                 DISCHARGE SUMMARY   ADMISSION DIAGNOSES:  1.  Febrile illness.  2.  Hypotension.  3.  History of anemia of chronic disease.  4.  History of secondary hyperparathyroidism, status post parathyroidectomy      in 1994.  5.  History of obstructive sleep apnea.  6.  History of end stage renal disease.  7.  History of insulin-dependent diabetes mellitus, type 2.   DISCHARGE DIAGNOSIS:  Methicillin resistant Staphylococcus aureus  bacteremia.   PROCEDURE:  1.  On June 29, 2005 left IJ Diatek catheter removed by CVTS.  2.  On July 02, 2005 a right femoral Diatek catheter placed by CVTS.  3.  Hemodialysis.   HISTORY OF PRESENT ILLNESS:  Briefly, [name] is a 50 year old  black woman who presented to short stay on June 28, 2005 due to a poorly  functioning catheter.  She was to receive an overnight dwell.  When she  arrived on June 28, 2005 for her catheter exchange by radiology, she  complained of temperature, chills, vomiting, diarrhea, and a productive  cough.  She was found to be hypotensive on day of admission along with a  temperature of 101.6.  She was admitted for intravenous antibiotics.   HOSPITAL COURSE:  PROBLEM #1:  FEBRILE ILLNESS:  Blood cultures X2 were  drawn as well as radiology.  Vancomycin 2.5 grams intravenously and Fortaz 2  grams intravenously X1 were given.  Her left IJ Diatek catheter was removed  by CVTS.  Antibiotics were continued throughout her hospitalization until  her day of discharge.  Vancomycin level was monitored by pharmacy as was her  dosing.  Blood cultures were positive for methicillin resistant  Staphylococcus aureus.  She has had methicillin resistant Staphylococcus  aureus in the past.  A 2-dimensional echocardiogram was obtained, however,  due to her body habitus we did not feel it would be a very sensitive study.  Therefore, the plan was to treat with vancomycin for four weeks.  A 2-  dimensional echocardiogram on July 03, 2005 revealed an ejection fraction of  55 to 65%.  LV wall thickness, mildly to moderately increased.  No likely  vegetation was apparent, but again, the study was exceptionally limited due  to large body habitus.  She remained afebrile for the next 48 hours,  however, did spike a temperature of 101.4 on July 01, 2005.  Because was  given adequate coverage with antibiotics, felt it was safe to proceed with a  catheter placement on July 02, 2005; this being a right femoral catheter  being placed by CVTS.  After July 01, 2005 the patient remained afebrile.  She also received 5 days worth of Avelox 400 mg p.o. daily due to  possibility of bronchitis.   PROBLEM #2:  HYPOTENSION:  Ms. Schultze did not realize that she had to  continue Midodrine 10 mg p.o. t.i.d. from previous hospitalization for her  blood pressure was low and certainly the low blood pressure was also related  to the sepsis.  She receives levocarnitine after hemodialysis as an  outpatient.  This was continued as an inpatient.  She received normal saline  boluses PRN.  The last 36 hours of her hospitalization her systolic blood  pressure normalized to the high 90's, low 100's which was baseline for her.   PROBLEM #3:  ANEMIA:  Hemoglobin on day of presentation was 11.9, on day  before discharge it was 10.2.  She received Aranesp 40 mcg on Thursday.  She  did not receive intravenous or p.o. iron during this hospitalization.   PROBLEM #4:  END STAGE RENAL DISEASE:  The patient received hemodialysis the  day she was admitted and then not again until July 02, 2005 with blood flows  averaging 300 to  275.   DISPOSITION:  Patient was discharged to home in improved condition.   DISCHARGE MEDICATIONS:  1.  Nephro-Vite one tablet p.o. daily.  2.  Phos-Lo 667 mg 5 with meals.  3.  Reglan 10 mg p.o. q.a.c. and at bedtime.  4.  Zocor 40 mg p.o. q.h.s.  5.  Trusopt one drop each daily.  6.  Xalatan 0.005% one drop each eye daily.  7.  Actos 45 mg p.o. daily.  8.  Amitriptyline 10 mg p.o. q.h.s.  9.  Midodrine 10 mg p.o. t.i.d.  10. Flovent 2 puffs twice daily.  11. Lantus 12 units q.h.s.  12. Epogen 7,000 units intravenously q. Hemodialysis.  13. Levocarnitine 6800 mg intravenous q. Hemodialysis.  14. Benadryl 25 mg p.o. prior to heparin bolus.  15. Vancomycin to be restarted on Saturday after vancomycin trough check      from Thursday.  Vancomycin will be given until July 26, 2005 which      will be a four week course.  Charge nurse at dialysis unit understands      to check surveillance blood cultures X2 two weeks after last vancomycin      dose.   PAIN MANAGEMENT:  Tylenol 650 mg every 4 to 6 hours as needed.   WOUND CARE:  Patient understood not to shower or get catheter dressing wet.   DIET:  She will continue an 80/2/2 renal diet, 1200 cc fluid restriction.   FOLLOW UP:  She will follow up at dialysis on her usual Tuesday, Thursday,  Saturday schedule at [facility].  Her estimated dry  weight will be 134 kg.      Delray Alt, PA      Sherril Croon, M.D.  Electronically Signed    MJG/MEDQ  D:  09/17/2005  T:  09/17/2005  Job:  [id number]   cc:   [facility]

## 2011-04-27 NOTE — Op Note (Signed)
NAMECHALANDA, [name]               ACCOUNT NO.:  [id number]   MEDICAL RECORD NO.:  [id number]          PATIENT TYPE:  OIB   LOCATION:  2899                         FACILITY:  [facility]   PHYSICIAN:  Judeth Cornfield. Scot Dock, M.D.DATE OF BIRTH:  [birth year]   DATE OF PROCEDURE:  02/10/2005  DATE OF DISCHARGE:                                 OPERATIVE REPORT   PREOPERATIVE DIAGNOSIS:  Chronic renal failure.   POSTOPERATIVE DIAGNOSIS:  Chronic renal failure.   PROCEDURE:  1.  Placement of a right femoral triple-lumen catheter.  2.  Attempted placement of right internal jugular Diatek  3.  Placement of a left internal jugular 32 cm Diatek catheter.   SURGEON:  Dr. Deitra Mayo.   ANESTHESIA:  Local with sedation.   TECHNIQUE:  The patient was taken to the operating room. We initially  attempted to place the catheter without sedation, as she was a very  difficult access problem.  However, she was not comfortable and I elected  therefore to not attempt this and to place a femoral triple-lumen catheter.  The right groin was prepped and draped in the usual sterile fashion.   After the skin was anesthetized with 1% lidocaine, the right common femoral  vein was cannulated and a guidewire introduced into the inferior vena cava.  The tract was dilated and then a triple-lumen catheter was placed over the  wire and wire removed.  All 3 ports withdrew easily. We then flushed with  saline and the patient was sedated through this catheter which was secured  in the groin with a nylon stitch.   The patient was then re-prepped and draped for placement of a Diatek  catheter. Both internal jugular veins had been marked with the ultrasound  scanner; the right one was fairly small. I was able to cannulate the right  IJ however, the guide wire would not pass despite using an angled Glidewire.  I could not shoot a venogram as the patient had a dye allergy.  I therefore  had to abort the right-sided  approach and I assume that she has an occluded  right IJ.   I was able to cannulate the left IJ and had some difficulty manipulating the  wire down into the right atrium. I had to use an end-hole catheter and an  angled Glidewire, but was able to get the wire down into the right atrium. I  then advanced the end-hole catheter into the right atrial and exchanged the  Glidewire for an Amplatz wire. The tract over the wire was dilated and then  dilator and peel-away sheath were passed over the wire and the wire and  dilator removed. The wire was not removed. The catheter was threaded over  the wire, passed through the peel-away sheath down into the right atrium.  The peel-away sheath and wire were then removed.   The exit site for the catheter was selected and the skin anesthetized  between the 2 areas.  The catheter was then brought through the tunnel, cut  to the appropriate length and the distal ports were attached. Both ports  withdrew  easily. We then flushed with heparinized saline and filled with  concentrated heparin. Catheter was secured at its exit  site with a 3-0 nylon suture. The IJ cannulation site was closed with a 4-0  subcuticular stitch. Sterile dressing was applied. The patient tolerated the  procedure well and was transferred to the recovery room in satisfactory  condition. All needle and sponge counts were correct.      CSD/MEDQ  D:  02/10/2005  T:  02/11/2005  Job:  [id number]

## 2011-04-27 NOTE — Op Note (Signed)
NAME:  [name], [name]                         ACCOUNT NO.:  [id number]   MEDICAL RECORD NO.:  [id number]                   PATIENT TYPE:  OIB   LOCATION:  2899                                 FACILITY:  [facility]   PHYSICIAN:  Judeth Cornfield. Scot Dock, M.D.        DATE OF BIRTH:  06-Jan-1961   DATE OF PROCEDURE:  01/25/2003  DATE OF DISCHARGE:                                 OPERATIVE REPORT   PREOPERATIVE DIAGNOSES:  1. Chronic renal failure.  2. Pseudoaneurysm of left thigh arteriovenous graft.   POSTOPERATIVE DIAGNOSES:  1. Chronic renal failure.  2. Pseudoaneurysm of left thigh arteriovenous graft.   PROCEDURE:  1. Ultrasound of bilateral internal jugular veins.  2. Placement of right IJ Diatek catheter (28 cm).  3. Placement of new left thigh AV graft.  4. Excision of pseudoaneurysm of old left thigh AV graft.   SURGEON:  Judeth Cornfield. Scot Dock, M.D.   ASSISTANT:  Nurse.   ANESTHESIA:  General.   INDICATIONS:  This is a 50 year old woman who had a left thigh AV graft  placed in 1997.  This had never clotted before; however, she presented to  the office with a pulsatile open wound in the distal aspect of the graft and  was felt to have a pseudoaneurysm and potentially infected graft.  She was  brought to the operating room and received a general anesthetic.  I  performed ultrasound of both internal jugular veins and both appeared to be  patent.  The neck and upper chest were prepped and draped in usual sterile  fashion.  After the right internal jugular vein was found with the finder  needle, the vein was cannulated, and the guide wire would not pass into the  superior vena cava.  This was a J wire.  I shot a venogram which did show,  however, that the vein was patent into the chest.  Therefore, I used an  angled Glidewire and was able to manipulate this down into the superior vena  cava.  The tract over the wire was then dilated and then a dilator and peel-  away sheath  were passed over the wire and the wire and dilator were removed.  A 28-cm catheter was then passed through the peel-away sheath and positioned  in the right atrium.  The exit site of the catheter was selected and the  skin anesthetized between the two areas.  The catheter was then brought  through the tunnel and the distal ports attached.  Both ports withdrew  easily and were then flushed with heparinized saline and filled with  concentrated heparin.  The catheter was secured at its exit site with a 3-0  nylon suture.  The IJ cannulation site was closed with a 4-0 subcuticular  stitch.  A sterile dressing was applied.   Next, attention was turned to revision of the left thigh AV graft.  Her  large abdominal pannus was taped superiorly.  The  left thigh and groin were  prepped and draped in the usual sterile fashion.  Oblique incisions were  made over the arterial and venous ends of the graft distal to the groin and  here the graft at each site was dissected free.  A new segment of 6-mm PTFE  was then tunneled through the two incisions using one distal  counterincision.  The patient was then heparinized.  The grafts were clamped  proximally and distally and then the old graft was divided and the segment  at each end excised.  The new graft was spatulated and sewn end to end at  both ends using continuous 6-0 Prolene suture.  Of note, at the venous end  there was significant degeneration within the graft; and therefore, I had to  dissect all the way up to the saphenous vein which was patent and did take a  3.5-mm dilator.  At completion, the clamps were released.  There was a good  thrill in the graft.  Hemostasis was obtained in the wounds.  The wounds  were closed with a deep layer of 3-0 Vicryl and the skin closed with 4-0  Vicryl.  The wounds had been irrigated with antibiotic solution.  Sterile  dressings were applied.  Next, once these wounds were covered, an elliptical  incision was  made encompassing the large pseudoaneurysm over the distal  graft.  This was a large, old pseudoaneurysm which was excised.  Hemostasis  was obtained using electrocautery, and then two interrupted 3-0 nylons were  placed and the wound was packed open.  A sterile dressing was applied.  The  patient tolerated the procedure well and was transferred to the recovery  room in satisfactory condition.  All needle and sponge counts were correct.                                                Judeth Cornfield. Scot Dock, M.D.    CSD/MEDQ  D:  01/25/2003  T:  01/25/2003  Job:  [id number]   cc:   Judeth Cornfield. Scot Dock, M.D.  [address]  [place]  [place] [address]  Fax: [phone]

## 2011-05-04 IMAGING — XA IR FLUORO GUIDE CV LINE*L*
1 series · 1 of 1 positions shown · non-contrast
Comparison: none

CLINICAL DATA: End-stage renal disease.  Left femoral hemodialysis
catheter has been partially withdrawn, the cuff exposed.

EXCHANGE OF TUNNELED HEMODIALYSIS CATHETER UNDER FLUOROSCOPY
TECHNIQUE: The previously placed left femoral hemodialysis
catheter and surrounding skin were prepped and draped in usual
sterile fashion after the patient received preprocedural
antibiotics. Intravenous Fentanyl and Versed were administered as
conscious sedation during continuous cardiorespiratory monitoring
by the radiology RN, with a total moderate sedation time of 10
minutes.   Fluoroscopic inspection demonstrated   the tips of the
catheter in the suprarenal IVC.  The catheter was removed over two
parallel stiff hydrophilic glide wires which advanced into the
right atrium.  Over this, a new HemoSplit 55 cm tunneled
hemodialysis catheter was advanced and positioned with its tips
across the IVC / RA junction.  Spot radiograph confirms appropriate
catheter position.  Both lumens aspirated and flushed easily.  The
catheter was flushed and primed per protocol and secured externally
with 0-Prolene sutures. The patient tolerated the procedure well.
No immediate complication
IMPRESSION
1.  Technically successful exchange of left femoral tunneled
hemodialysis catheter under fluoroscopy.

[Series 1: run · 1 of 1 slices shown]
[im 1/1]
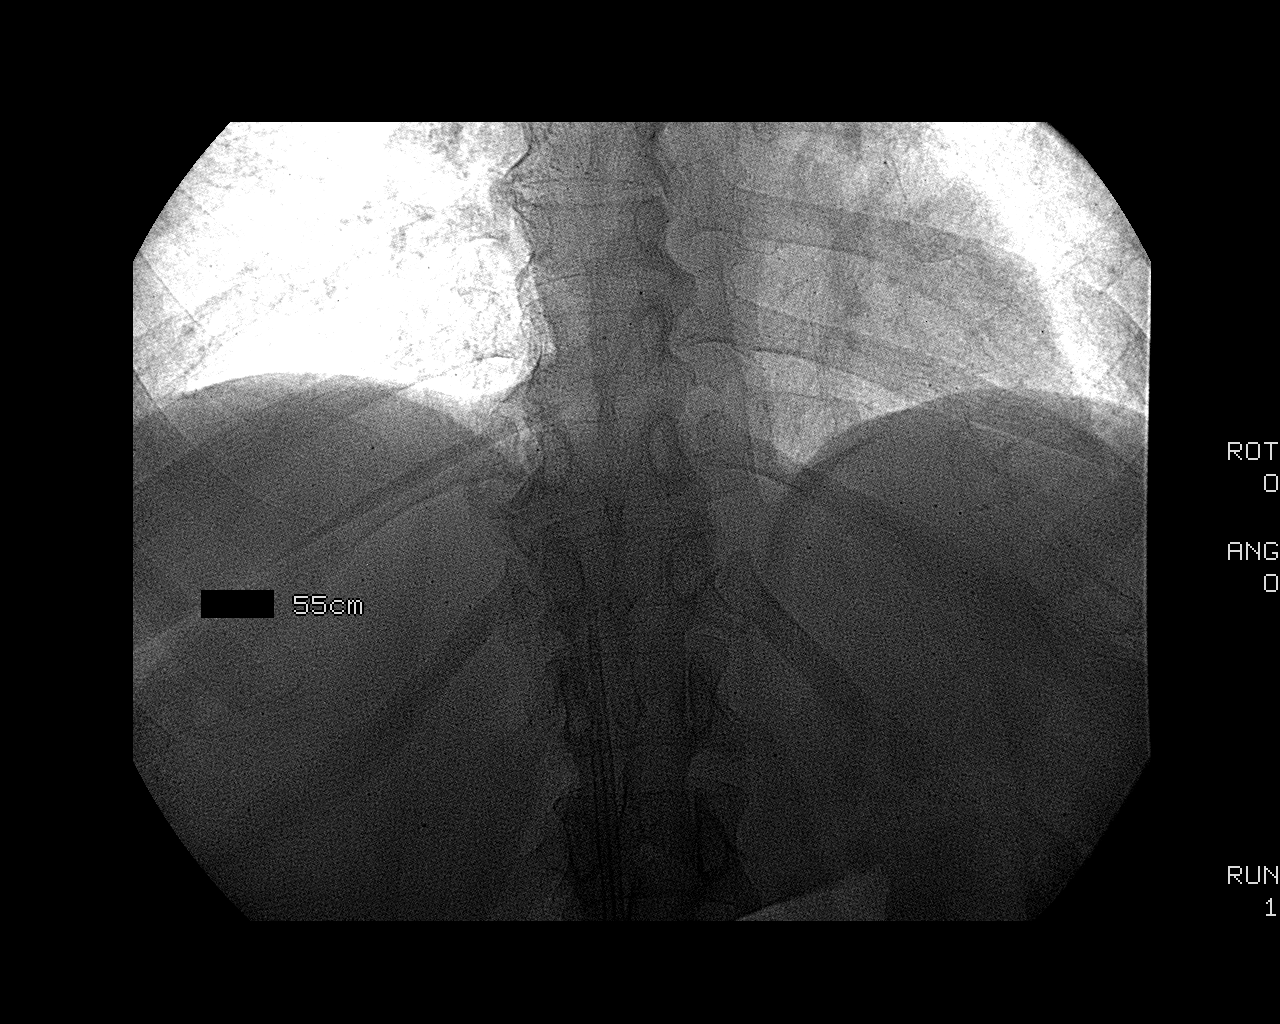

[1 of 1 positions shown; findings below may reference images not displayed]

## 2011-05-30 ENCOUNTER — Ambulatory Visit (HOSPITAL_COMMUNITY)
Admission: RE | Admit: 2011-05-30 | Discharge: 2011-05-30 | Disposition: A | Discharge status: Home / Self Care | Payer: Medicare Other | Source: Ambulatory Visit | Attending: Surgery | Admitting: Surgery

## 2011-05-30 DIAGNOSIS — I120 Hypertensive chronic kidney disease with stage 5 chronic kidney disease or end stage renal disease: Secondary | ICD-10-CM

## 2011-05-30 DIAGNOSIS — Z01810 Encounter for preprocedural cardiovascular examination: Secondary | ICD-10-CM | POA: Insufficient documentation

## 2011-05-30 DIAGNOSIS — Y832 Surgical operation with anastomosis, bypass or graft as the cause of abnormal reaction of the patient, or of later complication, without mention of misadventure at the time of the procedure: Secondary | ICD-10-CM | POA: Insufficient documentation

## 2011-05-30 DIAGNOSIS — T82898A Other specified complication of vascular prosthetic devices, implants and grafts, initial encounter: Secondary | ICD-10-CM

## 2011-05-30 DIAGNOSIS — N186 End stage renal disease: Secondary | ICD-10-CM

## 2011-05-30 LAB — POCT I-STAT, CHEM 8
BUN: 27 mg/dL — ABNORMAL HIGH (ref 6–23)
Calcium, Ion: 0.96 mmol/L — ABNORMAL LOW (ref 1.12–1.32)
Chloride: 97 mEq/L (ref 96–112)
Creatinine, Ser: 5.7 mg/dL — ABNORMAL HIGH (ref 0.50–1.10)
TCO2: 30 mmol/L (ref 0–100)

## 2011-06-24 NOTE — Op Note (Signed)
  NAMEHAYLE, [name]               ACCOUNT NO.:  [id number]  MEDICAL RECORD NO.:  [id number]  LOCATION:  [facility]SC                         FACILITY:  [facility]  PHYSICIAN:  Theotis Burrow IV, MDDATE OF BIRTH:  Aug 08, 1961  DATE OF PROCEDURE:  05/30/2011 DATE OF DISCHARGE:  05/30/2011                              OPERATIVE REPORT   PREOPERATIVE DIAGNOSIS:  High flow rates with dialysis.  POSTOPERATIVE DIAGNOSIS:  High flow rates with dialysis.  PROCEDURES PERFORMED: 1. Ultrasound access left thigh graft. 2. Dialysis graft study (fistulogram). 3. Percutaneous transluminal angioplasty venous (within the dialysis     graft, 6-mm balloon).  INDICATIONS:  A 50 year old female who dialyzed through a left thigh graft.  She has had decreased flow rates.  She comes in today for evaluation.  PROCEDURE IN DETAILS:  The patient was identified in the holding area and taken to room 8, placed in supine on the table.  The left groin was prepped and draped in usual fashion.  Time-out was called.  The left thigh graft was evaluated with ultrasound, found to be widely patent and digital ultrasound image was obtained.  The left thigh graft was then accessed under ultrasound guidance with a micropuncture needle and on that one a wire was advanced without resistance.  A micropuncture sheath was placed.  Contrast injections were performed through the sheath with reflux evaluation to evaluate the arterial anastomosis.  FINDINGS:  The left thigh graft is widely patent throughout its course. There is a hypodense area on the medial side of the apex of the graft. This does not appear to be flow-limiting.  The venous anastomosis is widely patent.  The iliac venous system is widely patent as is the inferior vena cava.  The arterial anastomosis is widely patent.  At this point in time, I decided to intervene over a Bentson wire, the sheath was upsized to a 5-French sheath.  A 6 x 2 balloon was advanced to the  area corresponding to the hypodense region.  The balloon was taken to 12 atmospheres and held out for 30 seconds.  A followup study revealed increase in flow across the hypodense area.  There was a hypodense area that remained, however, this again was not flow-limiting. Decision was made to stop at this time.  Balloon and wires were removed. The patient was taken to the holding area for sheath pull.  IMPRESSION: 1. Widely patent arterial and venous anastomosis. 2. Hypodense dense area within the left thigh graft that was ballooned     using a 6 x 2 balloon.  No central stenosis.     Eldridge Abrahams, MD     VWB/MEDQ  D:  05/30/2011  T:  05/31/2011  Job:  [id number]  Electronically Signed by Orvan Falconer IV MD on 06/24/2011 11:02:08 PM

## 2011-08-15 IMAGING — CR DG ANKLE COMPLETE 3+V*R*
3 series · 3 of 3 positions shown · non-contrast
Comparison: None.

CLINICAL DATA: Pain without trauma.

RIGHT ANKLE - COMPLETE 3+ VIEW

[t ankle joint ap right]
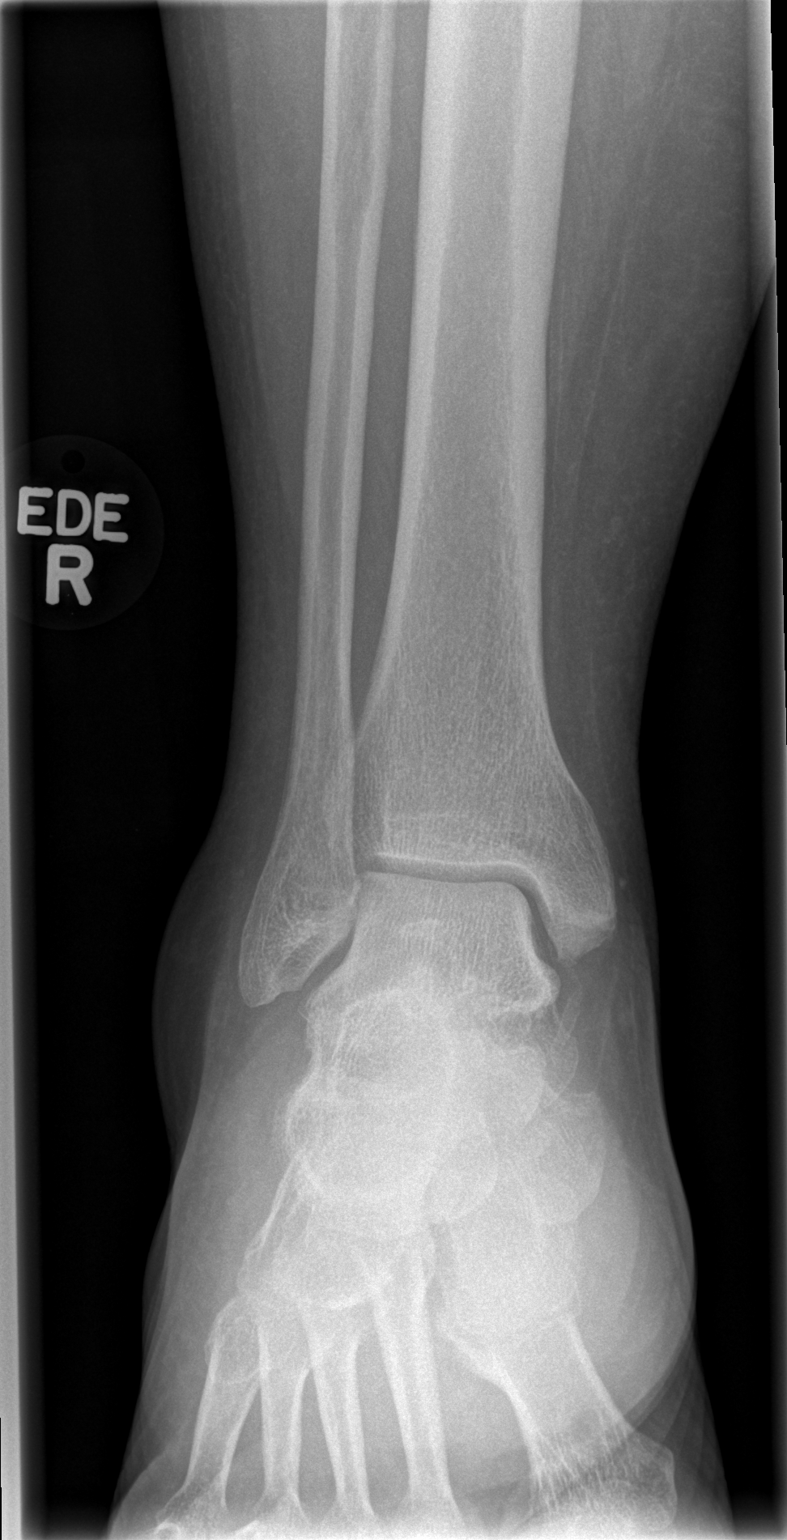

[t ankle joint oblique right]
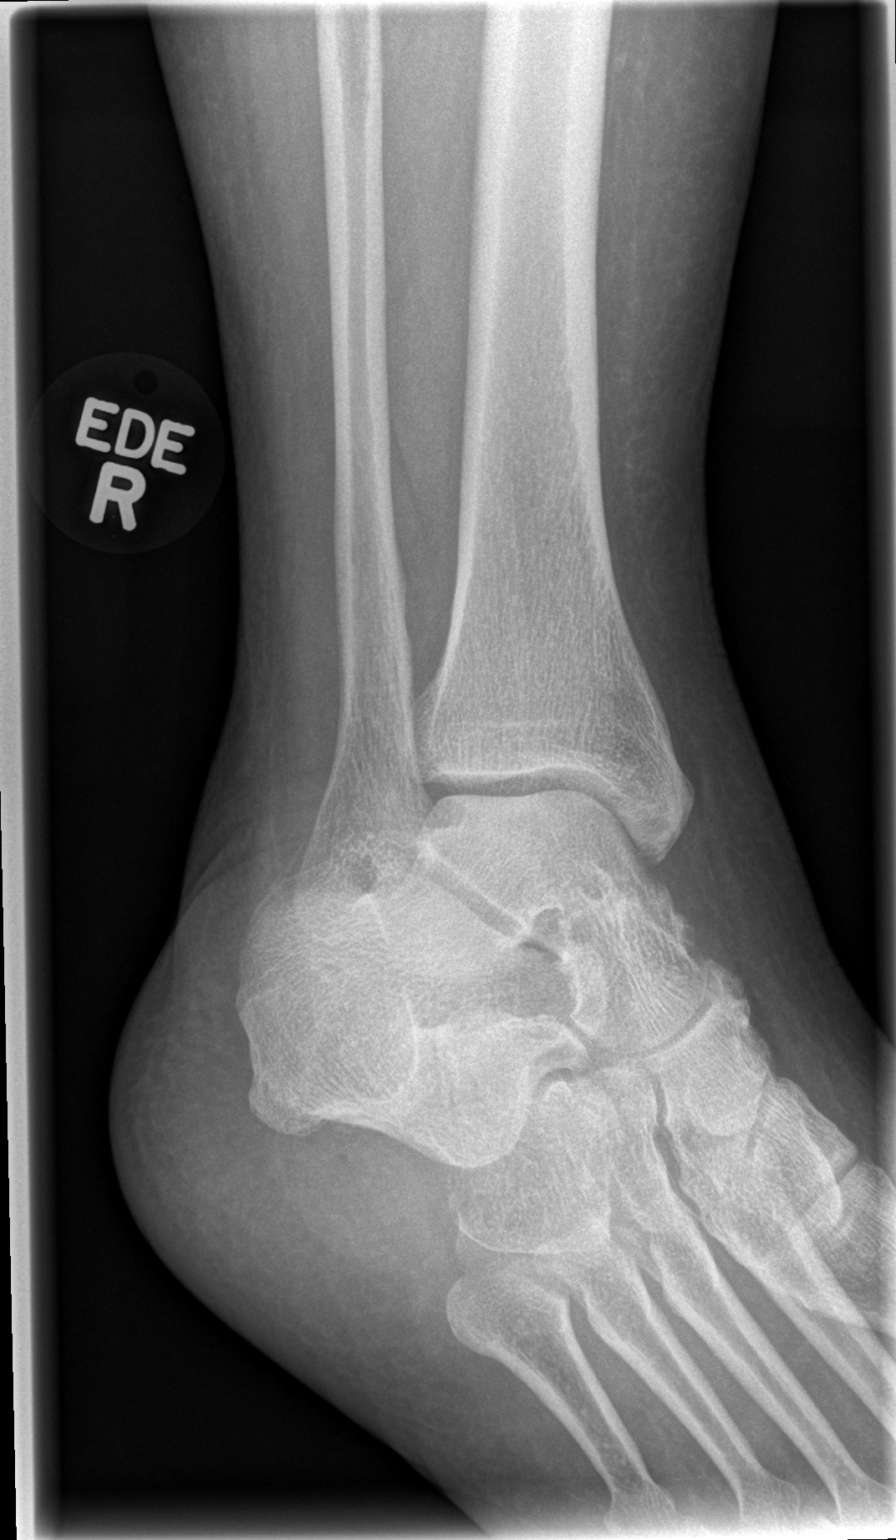

[t ankle joint lat right]
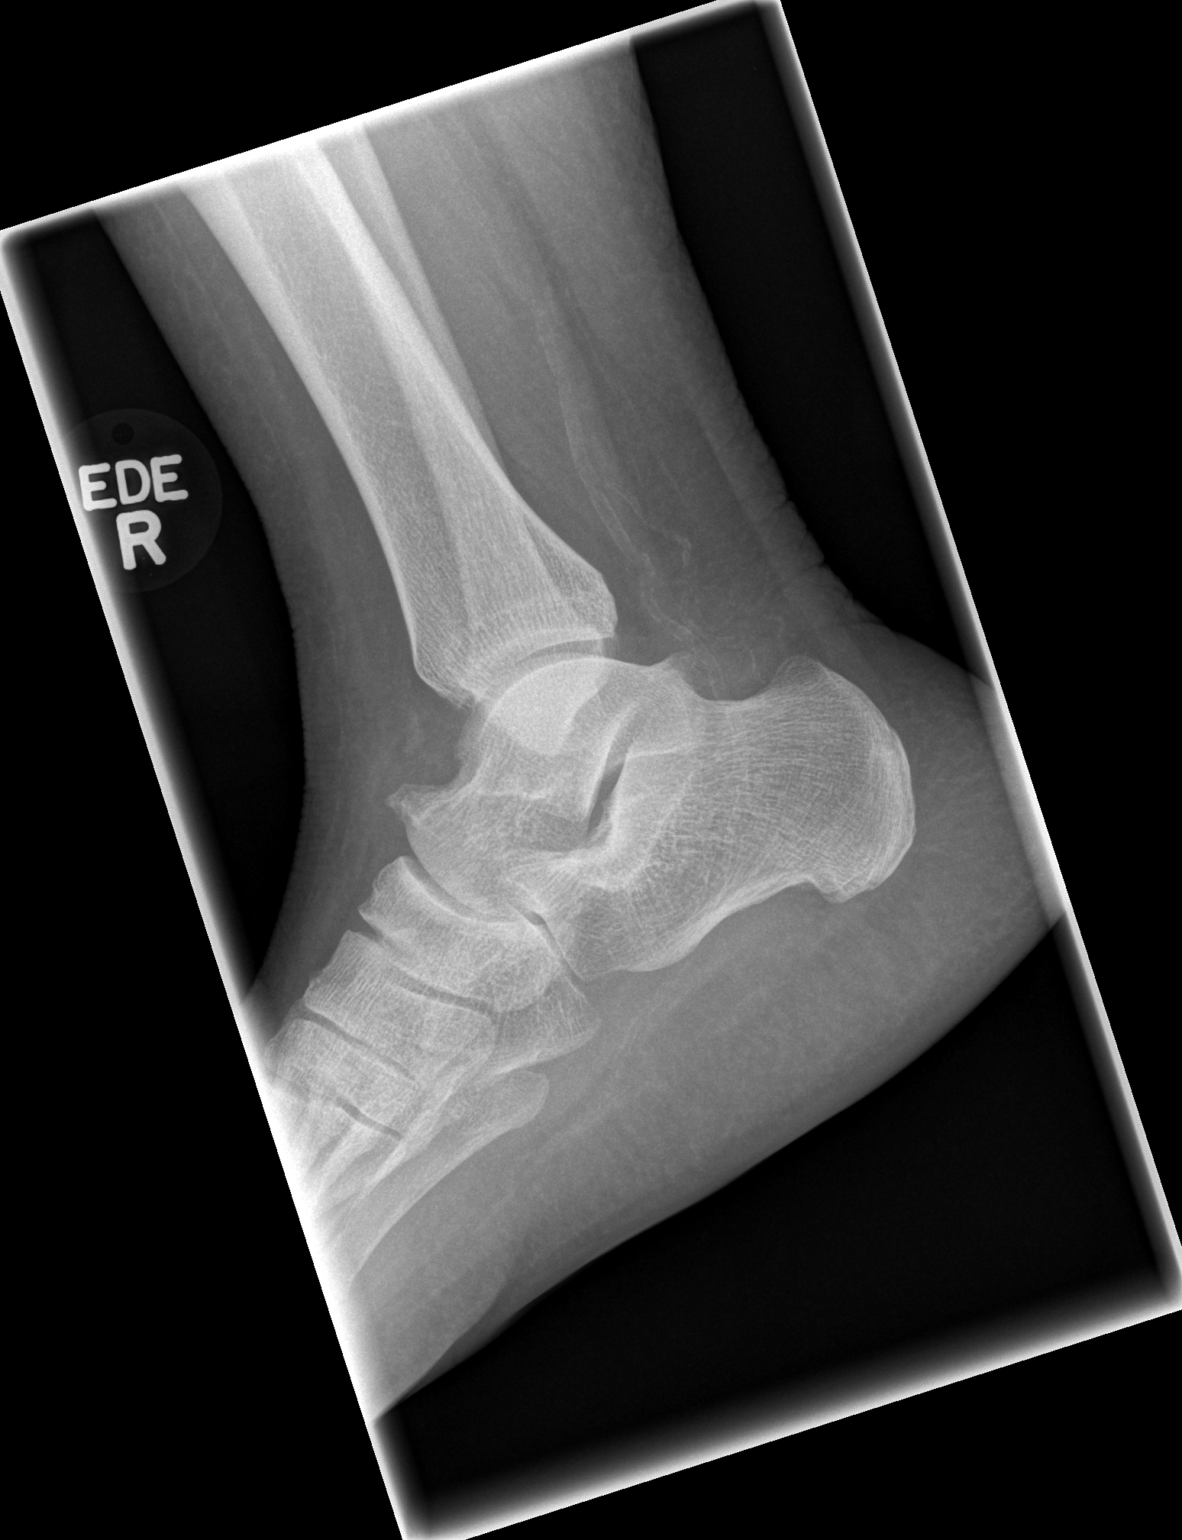

[3 of 3 positions shown; findings below may reference images not displayed]

FINDINGS: Lateral greater than medial malleolar soft tissue
swelling. No acute fracture or dislocation.  Age advanced vascular
calcifications.  Degenerative changes involve the tibiotalar joint
with joint space narrowing anteriorly and mild osteophyte
formation.
IMPRESSION: 1. No acute fracture or dislocation about the right ankle.
2.  Diffuse soft tissue swelling, greater lateral than medial.
3.  Age advanced mild tibiotalar osteoarthritis.
4.  Age advanced vascular calcifications, likely related to
diabetes.

## 2011-09-04 LAB — PROTIME-INR: Prothrombin Time: 13.6

## 2011-09-04 LAB — CBC
HCT: 40.4
MCHC: 32.2
MCV: 87.7
RBC: 4.61
WBC: 7.1

## 2011-09-04 LAB — BASIC METABOLIC PANEL
BUN: 21
CO2: 24
Chloride: 94 — ABNORMAL LOW
Creatinine, Ser: 7.01 — ABNORMAL HIGH
GFR calc Af Amer: 8 — ABNORMAL LOW
Potassium: 3.3 — ABNORMAL LOW

## 2011-11-18 IMAGING — XA IR FLUORO GUIDE CV LINE*L*
1 series · 3 of 3 positions shown · non-contrast
Comparison: none

CLINICAL DATA: End-stage renal disease, chronic left femoral a
stiff catheter which has retracted, retention cuff is exposed

[Series 1: run · 3 of 3 slices shown]
[im 1/3]
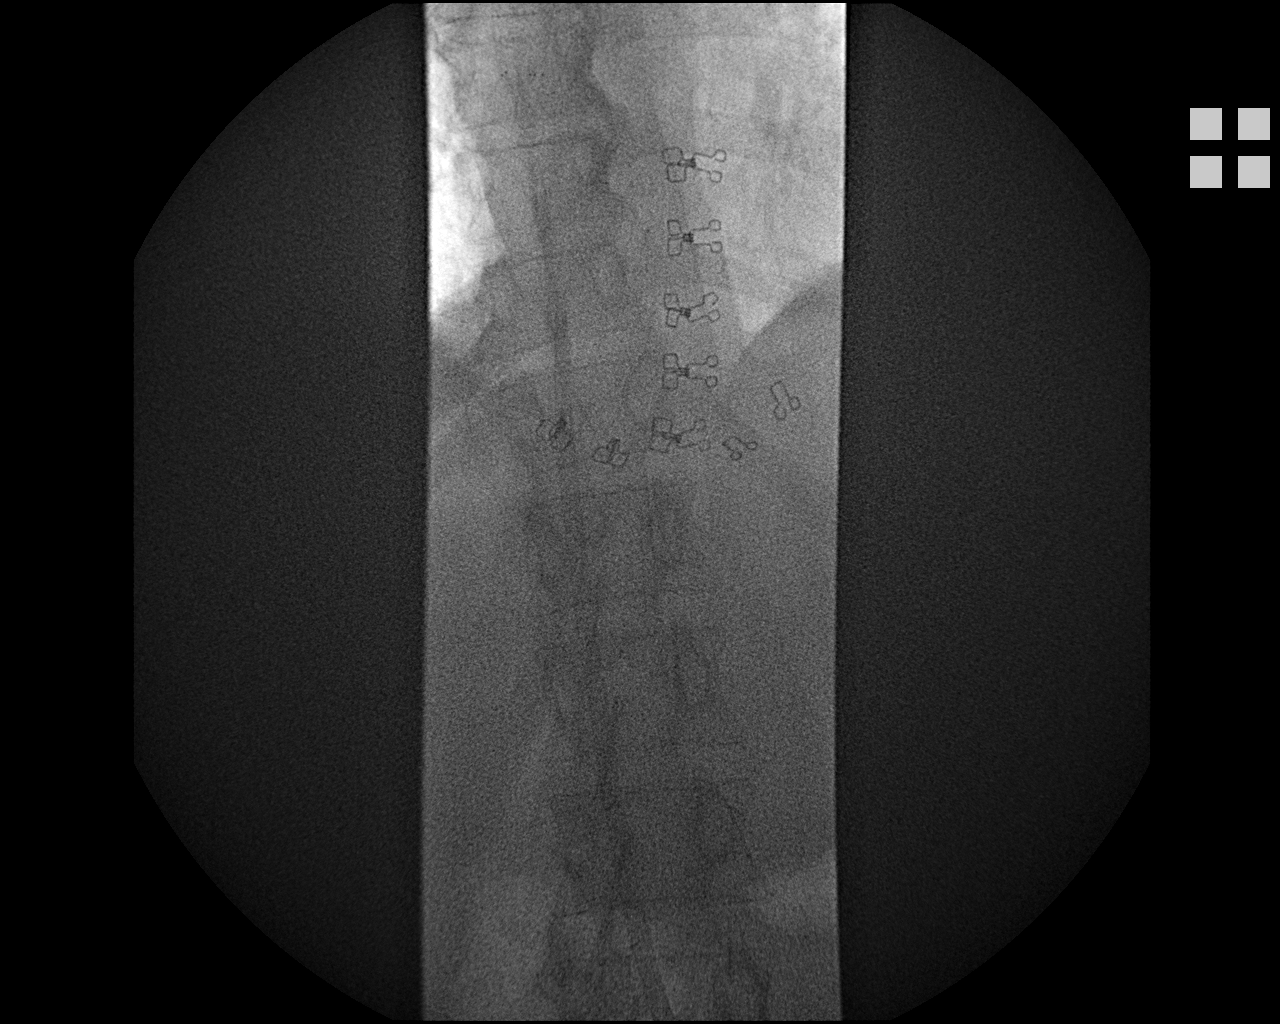
[im 2/3]
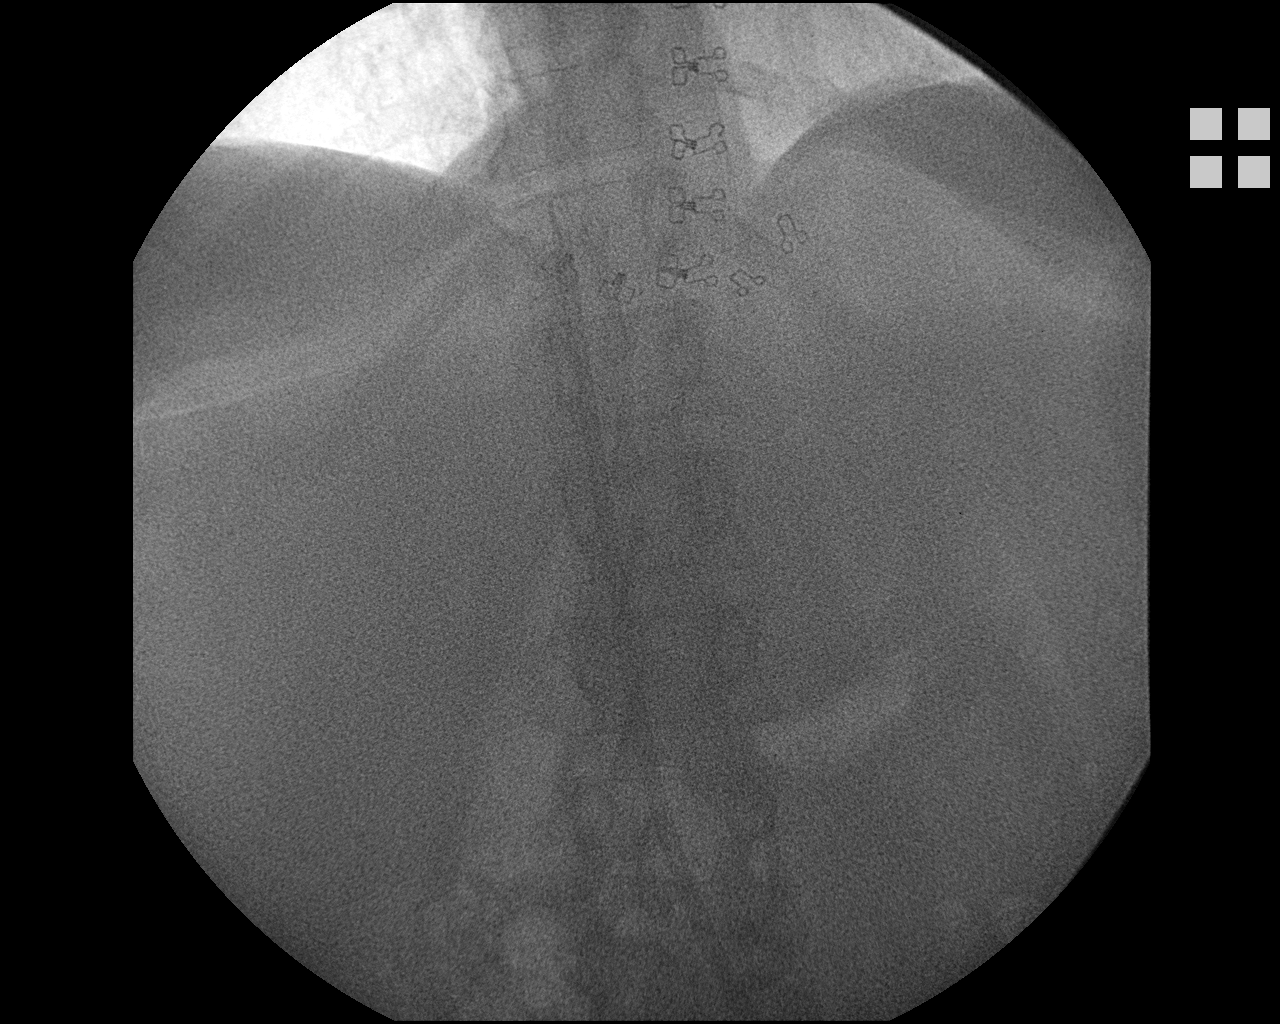
[im 3/3]
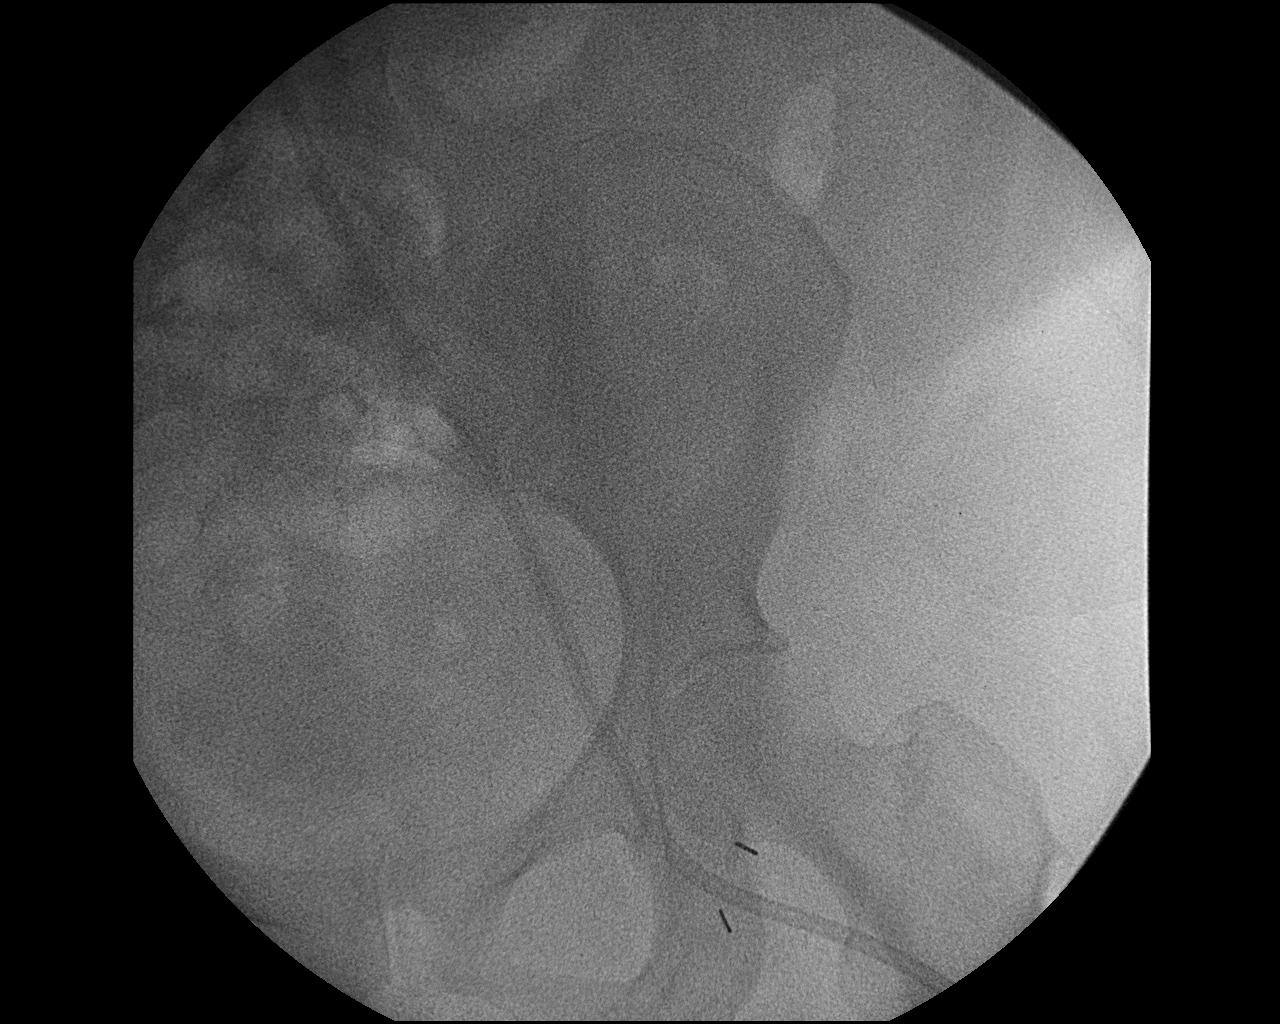

[3 of 3 positions shown; findings below may reference images not displayed]

FLUOROSCOPIC EXCHANGE OF THE LEFT FEMORAL HEMODIALYSIS CATHETER

Date:  07/07/2010 [DATE]

Radiologist:  Jokull Freyr Backman, M.D.

Medications:  1% lidocaine locally, 1 gram vancomycin

Guidance:  Fluoroscopic

Fluoroscopy time:  0.4 minutes

Sedation time:  None.

Contrast volume:  None.

Complications:  No immediate

PROCEDURE/FINDINGS:

Informed consent was obtained from the patient following
explanation of the procedure, risks, benefits and alternatives.
The patient understands, agrees and consents for the procedure.
All questions were addressed.  A time out was performed.

Maximal barrier sterile technique utilized including caps, mask,
sterile gowns, sterile gloves, large sterile drape, hand hygiene,
and Chloroprep

Under sterile conditions and local anesthesia, the existing
retracted left femoral tunneled dialysis catheter was removed over
a stiff Glidewire.  A new 55 cm HemoSplit catheter was advanced
over a stiff Glidewire with the tips positioned at the IVC right
atrial junction.  Position confirmed with fluoroscopy.  Images
obtained for documentation.  Blood aspirated easily from both
lumens followed by saline flushes.  Appropriate volume and strength
of heparin instilled in both lumens followed by external caps.
Catheter was secured at the skin site with Gelfoam pledgets for
hemostasis and a Prolene suture.  Sterile dressing applied.  No
immediate complication.  The patient tolerated the procedure well.
IMPRESSION: Fluoroscopic exchange of the left femoral tunneled hemodialysis
catheter.  Tips in the IVC right atrial junction ready for use.

## 2011-11-22 ENCOUNTER — Other Ambulatory Visit (HOSPITAL_COMMUNITY): Payer: Self-pay | Admitting: Nephrology

## 2011-11-22 DIAGNOSIS — N186 End stage renal disease: Secondary | ICD-10-CM

## 2011-11-26 ENCOUNTER — Encounter (HOSPITAL_COMMUNITY): Payer: Self-pay

## 2011-11-28 ENCOUNTER — Ambulatory Visit (HOSPITAL_COMMUNITY): Admission: RE | Admit: 2011-11-28 | Payer: Medicare Other | Source: Ambulatory Visit

## 2011-11-28 ENCOUNTER — Ambulatory Visit (HOSPITAL_COMMUNITY): Payer: Medicare Other

## 2011-11-30 IMAGING — XA IR FLUORO GUIDE CV LINE*L*
1 series · 2 of 2 positions shown · non-contrast
Comparison: none

CLINICAL DATA: Indwelling tunneled left femoral dialysis catheter
has retracted significantly with exposure of the cuff.

[Series 1: run · 2 of 2 slices shown]
[im 1/2]
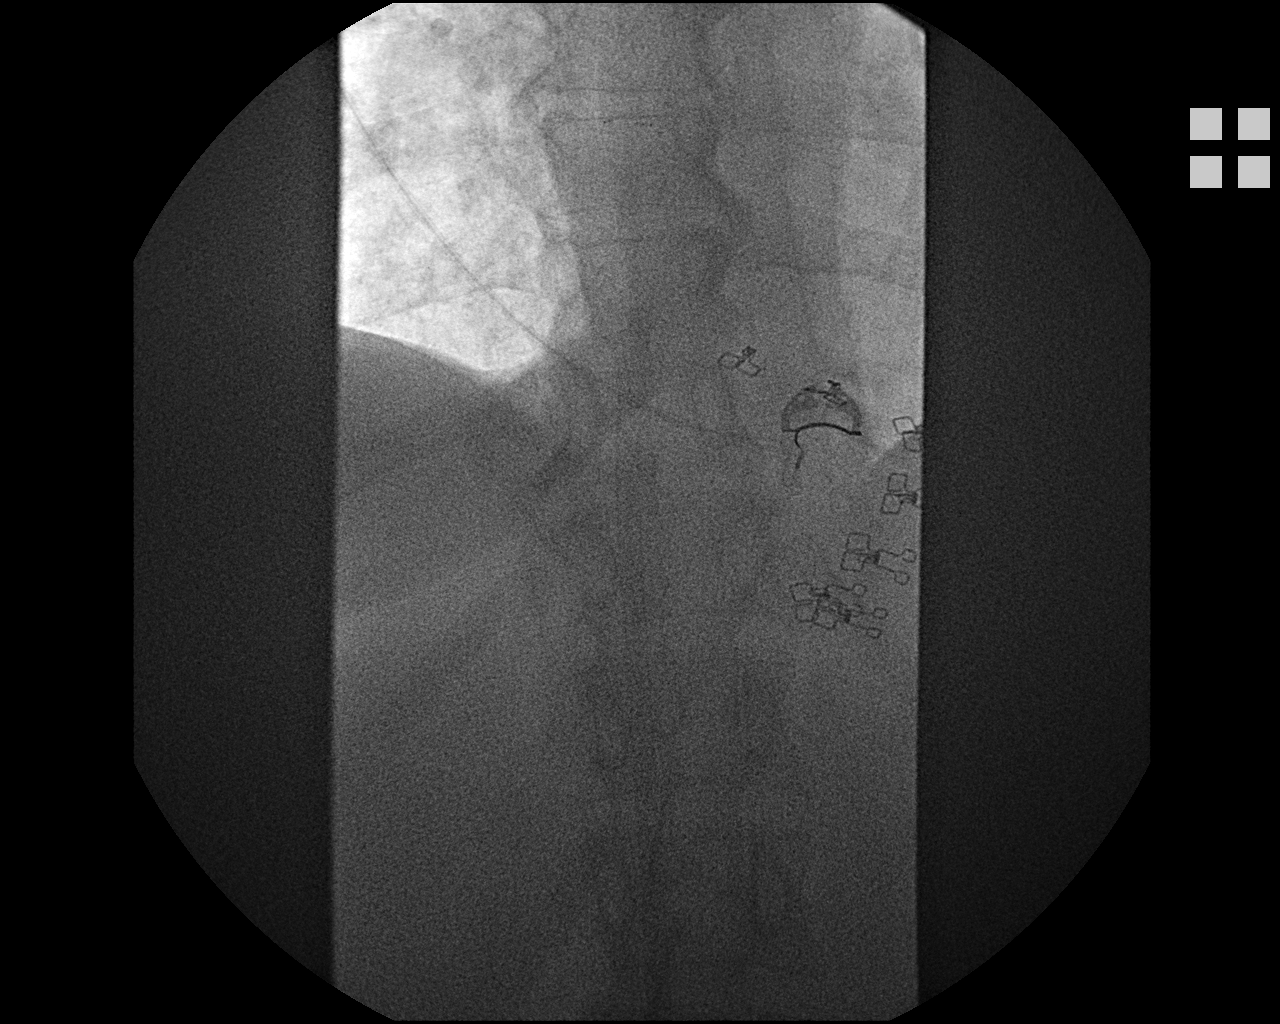
[im 2/2]
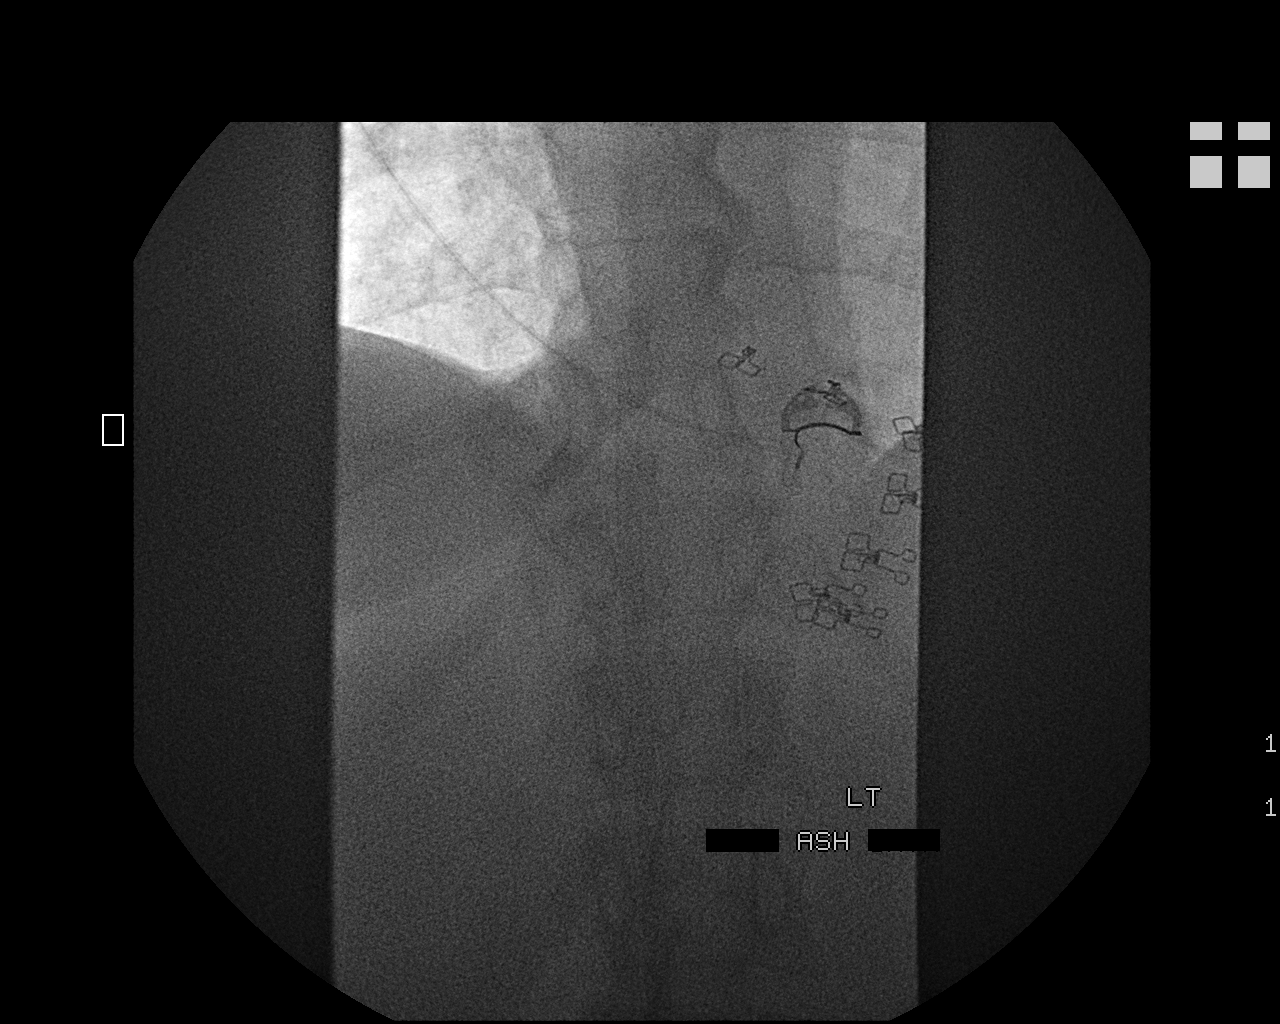

[2 of 2 positions shown; findings below may reference images not displayed]

EXCHANGE OF TUNNELED CENTRAL VENOUS HEMODIALYSIS  CATHETER UNDER
FLUOROSCOPIC GUIDANCE

Additional Medications:  1 gram IV vancomycin.  IV antibiotic was
given in a appropriate time interval prior to skin puncture.

Fluoroscopy Time:  0.8 minutes.

Procedure:  The procedure, risks, benefits, and alternatives were
explained to the patient.  Questions regarding the procedure were
encouraged and answered.  The patient understands and consents to
the procedure.

The preexisting dialysis catheter and surrounding skin were prepped
with chlorhexidine in a sterile fashion, and a sterile drape was
applied covering the operative field.  Maximum barrier sterile
technique with sterile gowns and gloves were used for the
procedure.  Local anesthesia was provided with 1% lidocaine.

The preexisting dialysis catheter was removed over a guidewire.  A
new Ash tunneled hemodialysis catheter measuring 55 cm from tip to
cuff was chosen for placement.  This was advanced over the
guidewire under fluoroscopy.  Final catheter positioning was
confirmed and documented with a fluoroscopic spot image.  The
catheter was aspirated, flushed with saline, and injected with
appropriate volume heparin dwells.

Complications: None.  No pneumothorax.
FINDINGS: After catheter placement, the tips lie in the right
atrium.  The catheter aspirates normally and is ready for immediate
use.
IMPRESSION: Exchange of tunneled hemodialysis catheter via the left femoral
vein.  The catheter tips lie in the right atrium.  The catheter is
ready for immediate use.

## 2011-12-05 IMAGING — CR DG CHEST 1V PORT
1 series · 1 of 1 positions shown · non-contrast
Comparison: Portable exam 3440 hours compared to 08/31/2009

CLINICAL DATA: Weakness, diabetes, chills, vomiting

PORTABLE CHEST - 1 VIEW

[view not recorded]
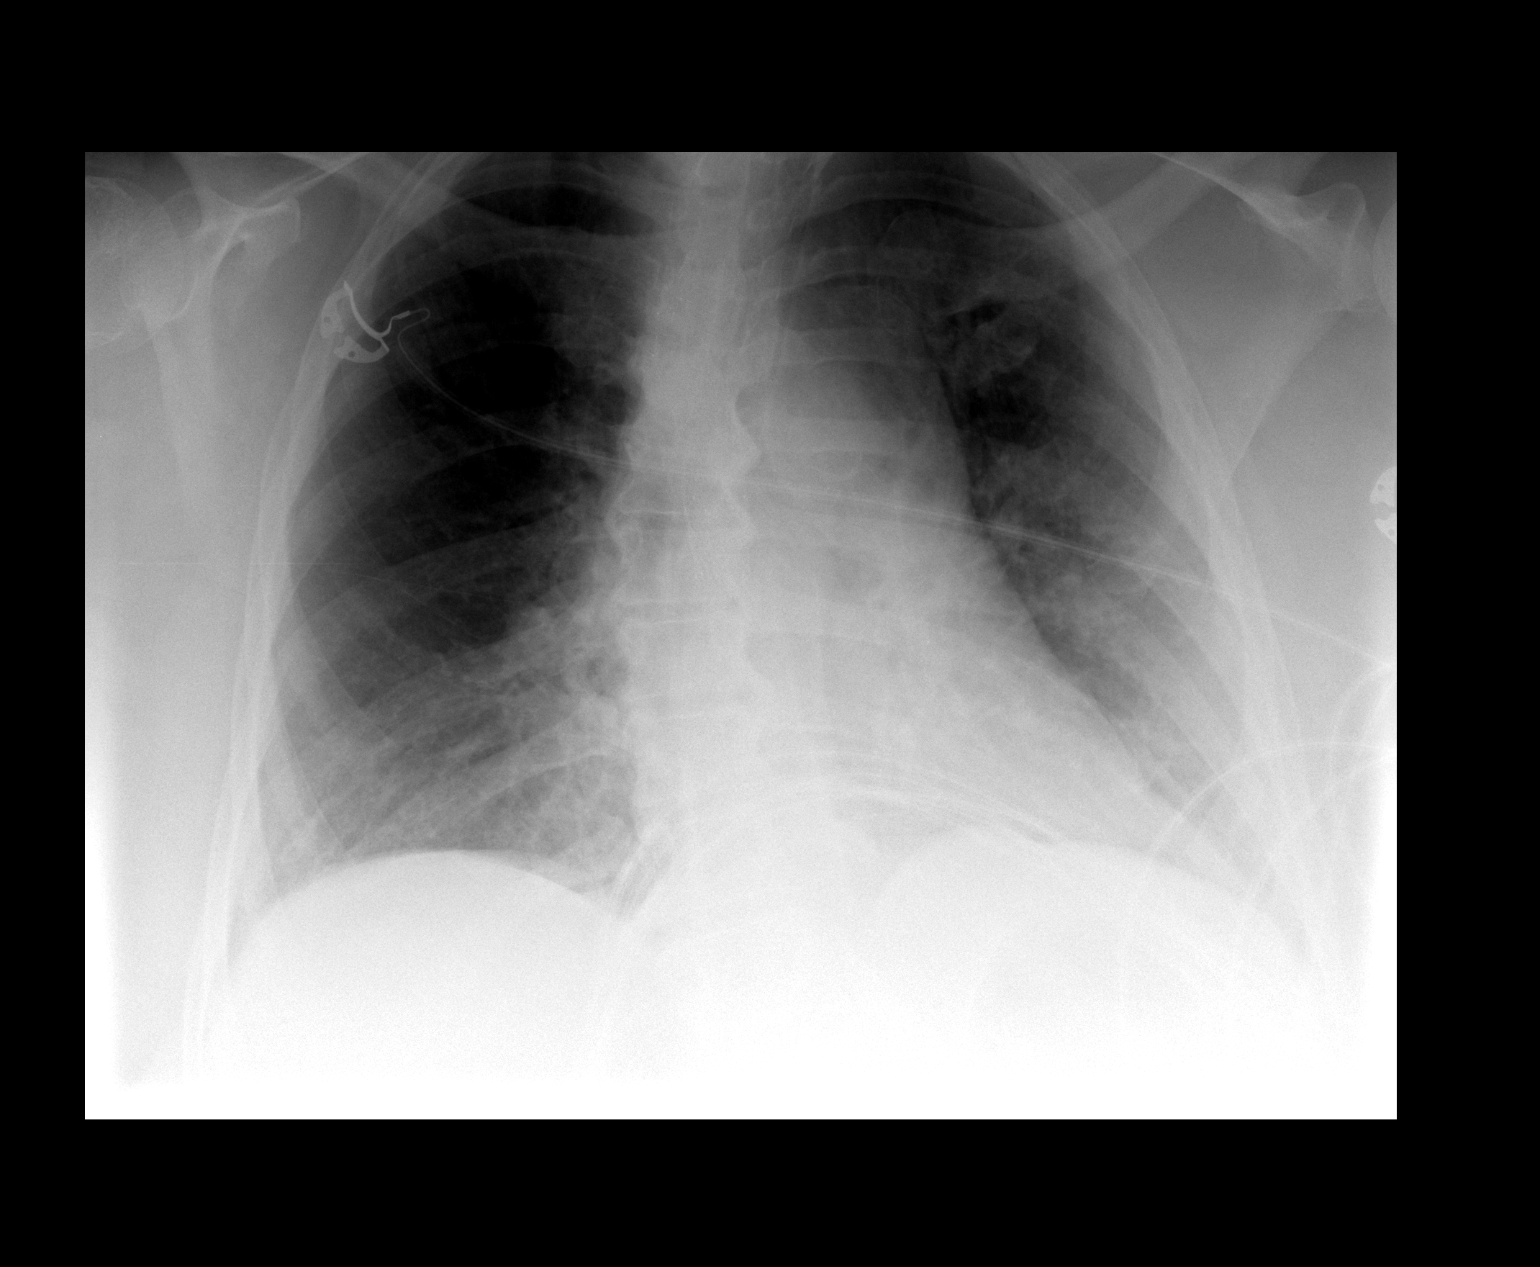

[1 of 1 positions shown; findings below may reference images not displayed]

FINDINGS: Rotated to the left.
Normal heart size, mediastinal contours, and pulmonary vascularity
for technique.
Minimal atherosclerotic calcification aortic arch.
Minimal right basilar atelectasis.
Remaining lungs clear.
Spur formation thoracic spine.
No pneumothorax.
IMPRESSION: Minimal right basilar atelectasis.

## 2011-12-06 IMAGING — NM NM PULM PERFUSION & VENT (REBREATHING & WASHOUT)
2 series · 12 of 12 positions shown · non-contrast
Comparison: Chest radiograph 07/24/2010

CLINICAL DATA: Fever chills, end-stage renal disease, concern
pulmonary embolism, short of breath

NUCLEAR MEDICINE VENTILATION - PERFUSION LUNG SCAN
TECHNIQUE: Wash-in, equilibrium, and wash-out phase ventilation
images were obtained using 3e-FVV gas.  Perfusion images were
obtained in multiple projections after intravenous injection of Tc-
99m MAA.
Radiopharmaceuticals:  10.0 mCi 3e-FVV gas and 6.0 mCi Oc-JJm MAA.

[vq scan · 2.52mm/px · 6 of 17 frames shown (1 of 2)]
[frame 2/17  full-range]
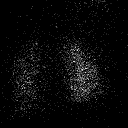
[frame 4/17]
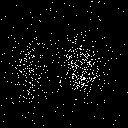
[frame 7/17]
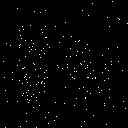
[frame 10/17]
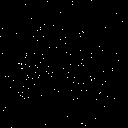
[frame 13/17]
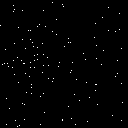
[frame 16/17]
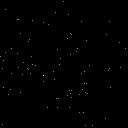

[vq scan · 2.52mm/px · 6 of 17 frames shown (2 of 2)]
[frame 2/17  full-range]
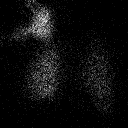
[frame 4/17]
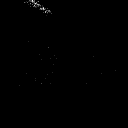
[frame 7/17]
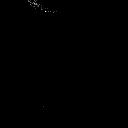
[frame 10/17]
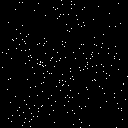
[frame 13/17]
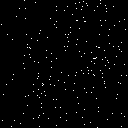
[frame 16/17]
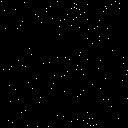

[12 of 12 positions shown; findings below may reference images not displayed]

FINDINGS: Ventilation:  No focal ventilation defect. No significant air
trapping.

Perfusion:  Small peripheral defect within the right mid lung seen
on the anterior projection.  No large peripheral segmental defects
are evident.  There is a small defect at posterior left lung base.
IMPRESSION: Two small perfusion defects .  Very Low probability for acute
pulmonary embolism.

## 2011-12-07 IMAGING — XA IR [PERSON_NAME]/EXT/UNI*R*
1 series · 13 of 18 positions shown · non-contrast
Comparison: none

Clinical Data/Indication: INFECTED LEFT GROIN DIALYSIS CATHETER.

RIGHT EXTREMITY VENOGRAPHY, ULTRASOUND GUIDANCE
Contrast Volume: 40 ml 5mnipaque-LBB.
Fluoroscopy Time: One minutes.
Procedure: The procedure, risks, benefits, and alternatives were
explained to the patient. Questions regarding the procedure were
encouraged and answered. The patient understands and consents to
the procedure.
The right groin was prepped with betadine in a sterile fashion, and
a sterile drape was applied covering the operative field. A sterile
gown and sterile gloves were used for the procedure.
Under sonographic guidance, a micropuncture needle was inserted
into the right common femoral vein and removed over a 3340 wire.  A
5-French transitional dilator was inserted.  Venography was
performed.  The dilator was removed and hemostasis was achieved
with direct pressure without complication.

[Series 1: run · 13 of 18 slices shown]
[im 1/18]
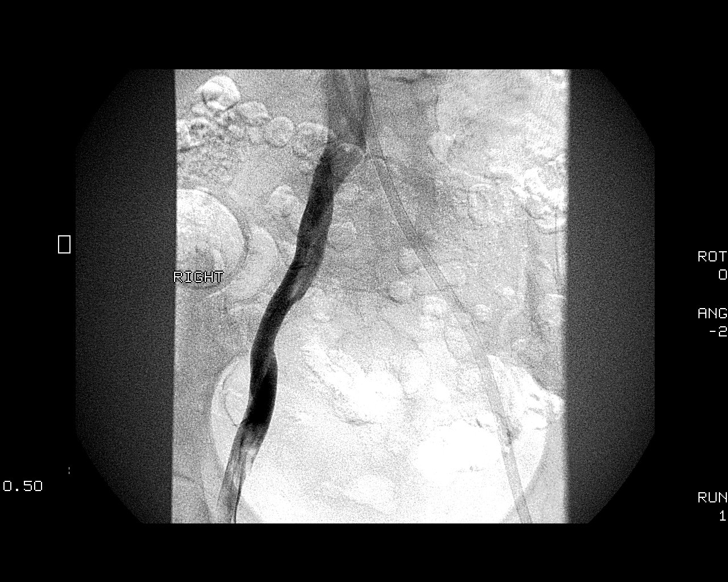
[im 3/18]
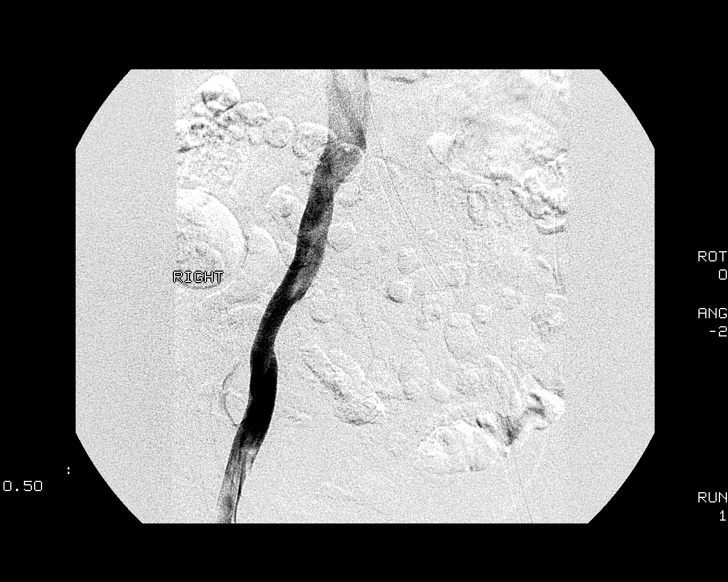
[im 4/18]
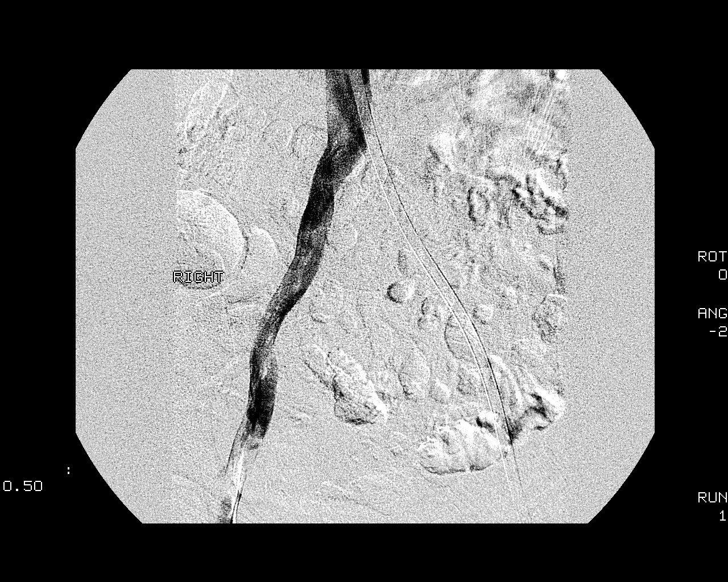
[im 5/18]
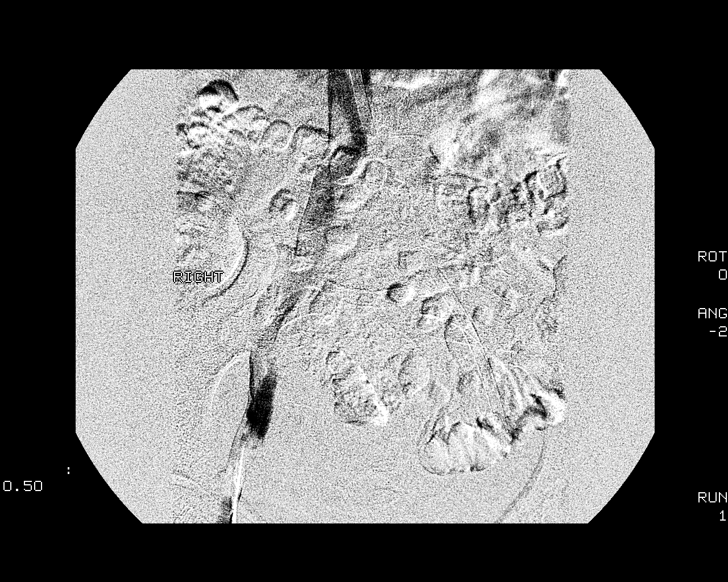
[im 7/18]
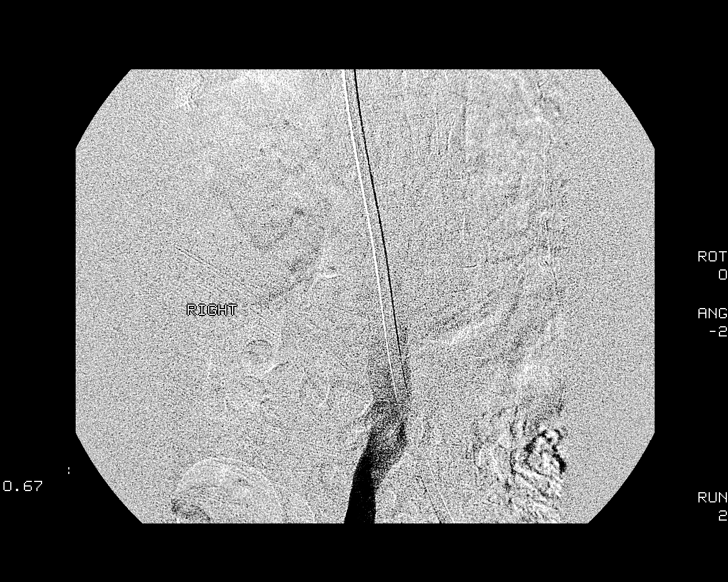
[im 8/18]
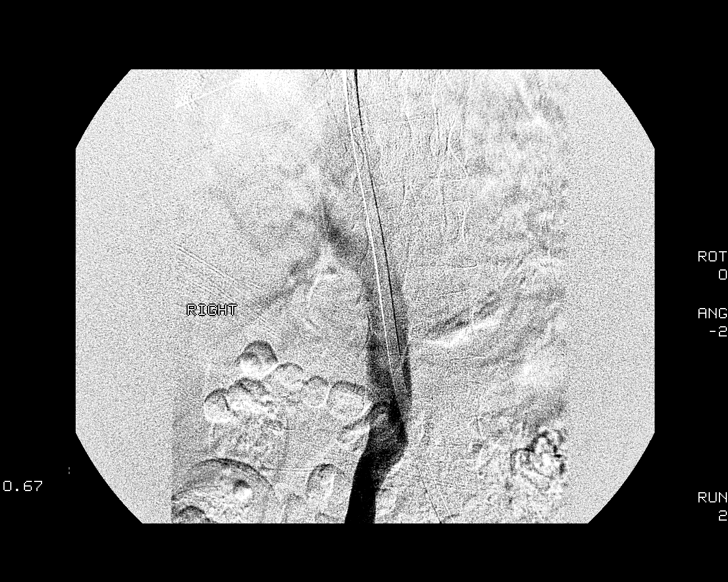
[im 10/18]
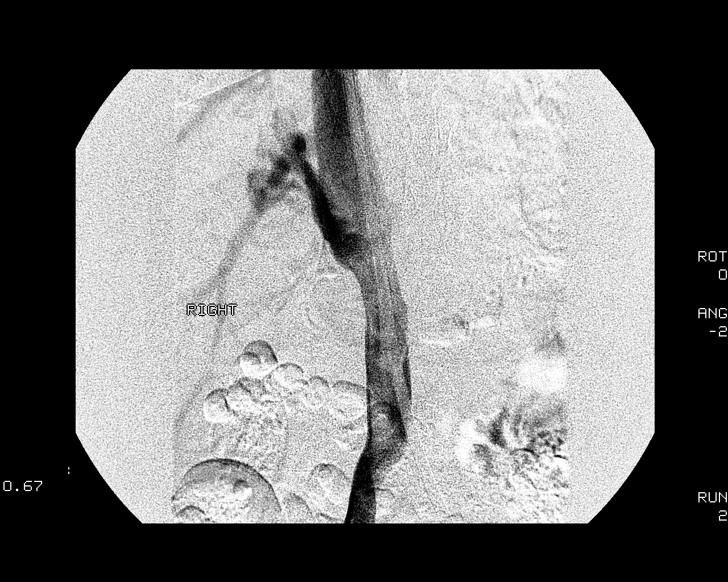
[im 11/18]
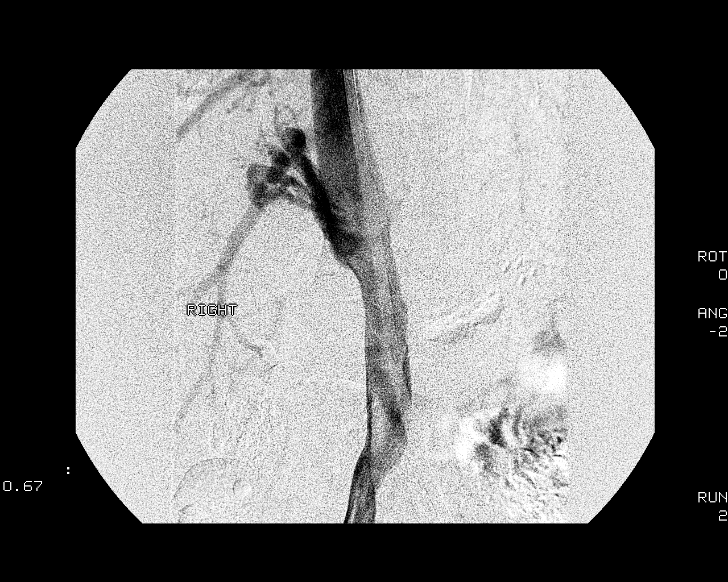
[im 12/18]
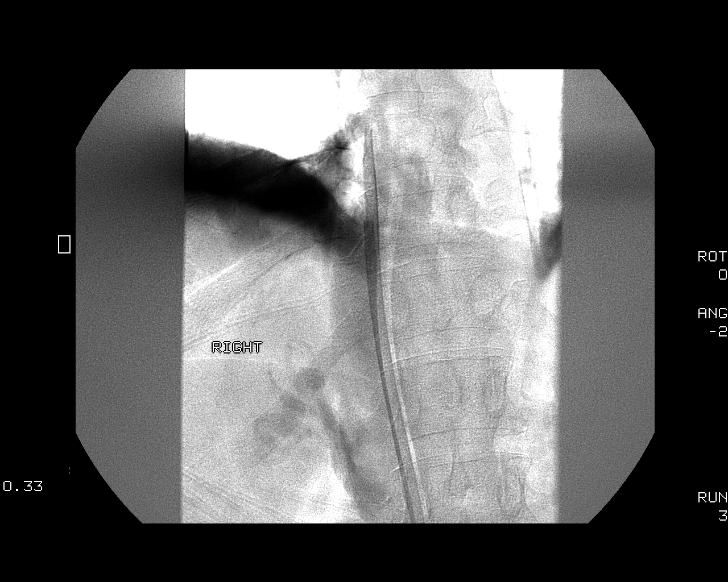
[im 14/18]
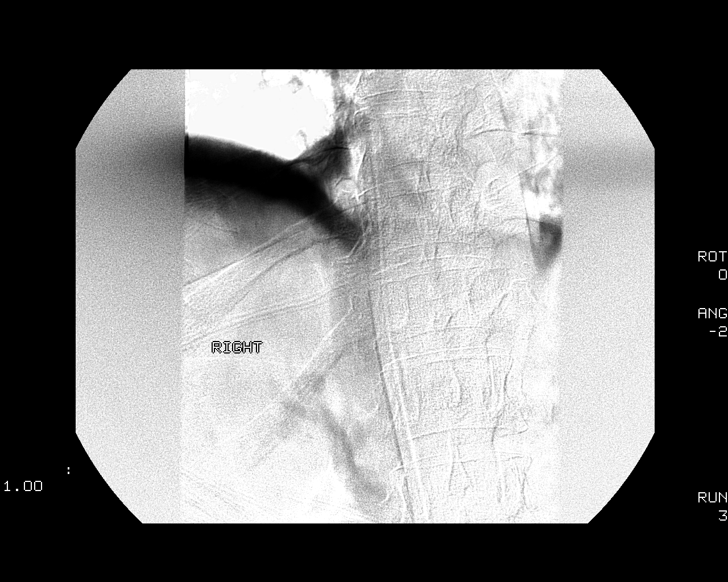
[im 15/18]
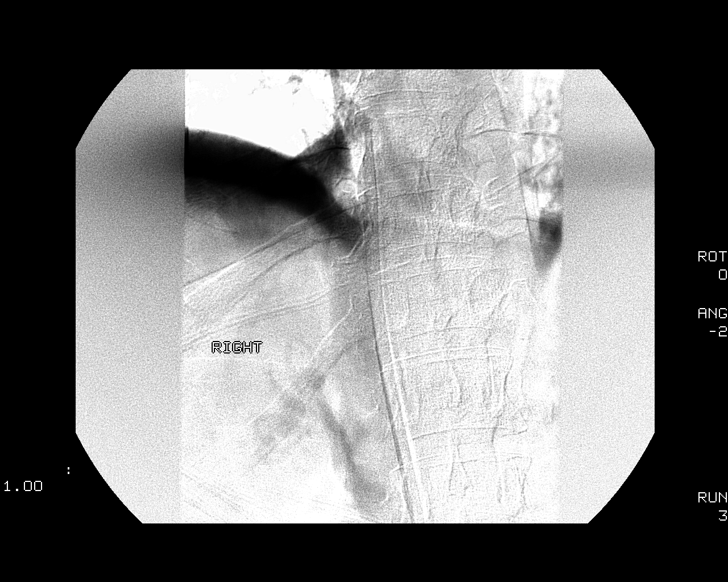
[im 16/18]
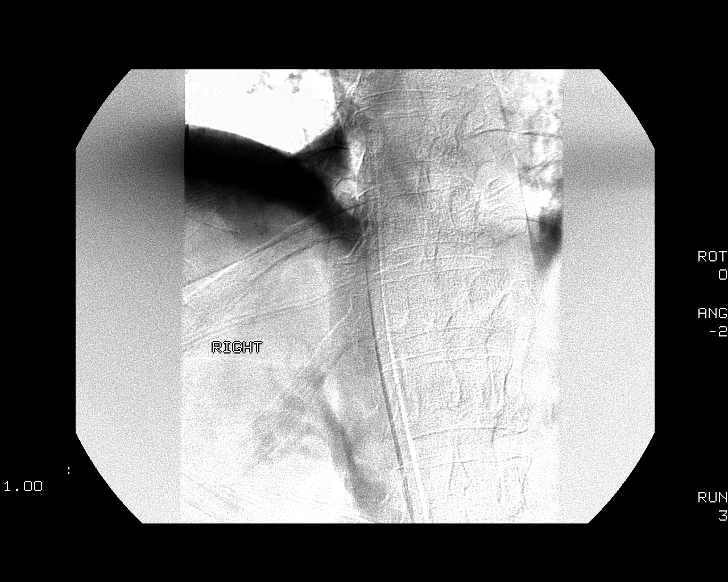
[im 18/18]
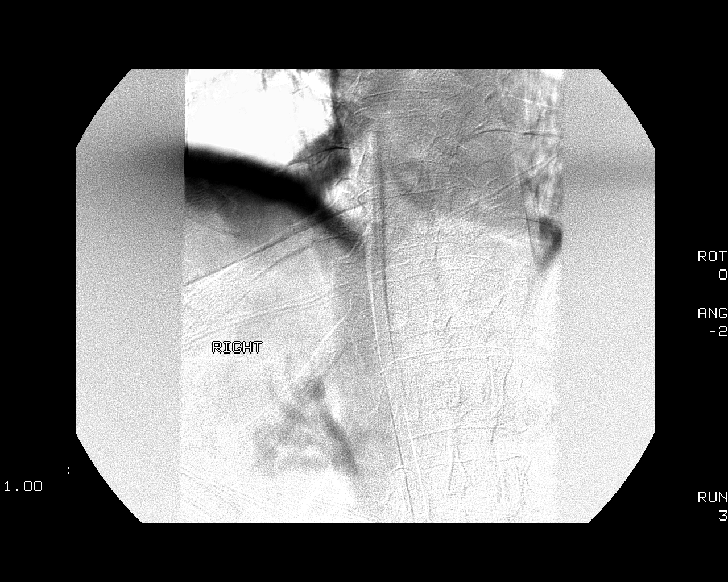

[13 of 18 positions shown; findings below may reference images not displayed]

FINDINGS: Sonographic imaging was performed to document vascular
access.

The right common femoral vein, external iliac vein, common iliac
vein, and IVC are all widely patent.

Complications: None
IMPRESSION: Right common femoral vein, iliac venous system, and IVC are all
widely patent.  The dialysis catheter will be removed from the left
groin tomorrow after dialysis and a new one will be placed on
[DATE].

## 2011-12-25 ENCOUNTER — Other Ambulatory Visit (HOSPITAL_COMMUNITY): Payer: Self-pay | Admitting: Nephrology

## 2011-12-25 DIAGNOSIS — N186 End stage renal disease: Secondary | ICD-10-CM

## 2011-12-28 ENCOUNTER — Ambulatory Visit (HOSPITAL_COMMUNITY): Payer: Medicare Other

## 2012-01-02 ENCOUNTER — Ambulatory Visit (HOSPITAL_COMMUNITY)
Admission: RE | Admit: 2012-01-02 | Discharge: 2012-01-02 | Disposition: A | Discharge status: Home / Self Care | Payer: Medicare Other | Source: Ambulatory Visit | Attending: Nephrology | Admitting: Nephrology

## 2012-01-02 DIAGNOSIS — T82898A Other specified complication of vascular prosthetic devices, implants and grafts, initial encounter: Secondary | ICD-10-CM | POA: Insufficient documentation

## 2012-01-02 DIAGNOSIS — N186 End stage renal disease: Secondary | ICD-10-CM

## 2012-01-02 DIAGNOSIS — Y849 Medical procedure, unspecified as the cause of abnormal reaction of the patient, or of later complication, without mention of misadventure at the time of the procedure: Secondary | ICD-10-CM | POA: Insufficient documentation

## 2012-01-02 MED ORDER — IOHEXOL 300 MG/ML  SOLN
100.0000 mL | Freq: Once | INTRAMUSCULAR | Status: AC | PRN
  Administered 2012-01-02: 36 mL via INTRAVENOUS

## 2012-01-03 ENCOUNTER — Telehealth (HOSPITAL_COMMUNITY): Payer: Self-pay

## 2012-01-23 ENCOUNTER — Other Ambulatory Visit (HOSPITAL_COMMUNITY): Payer: Self-pay | Admitting: Nephrology

## 2012-01-23 DIAGNOSIS — N186 End stage renal disease: Secondary | ICD-10-CM

## 2012-01-30 ENCOUNTER — Ambulatory Visit (HOSPITAL_COMMUNITY): Admission: RE | Admit: 2012-01-30 | Payer: Medicare Other | Source: Ambulatory Visit

## 2012-02-24 IMAGING — CR DG CHEST 1V PORT
1 series · 1 of 1 positions shown · non-contrast
Comparison: Chest 10/13/2010.

CLINICAL DATA: End-stage renal disease.

PORTABLE CHEST - 1 VIEW

[AP]
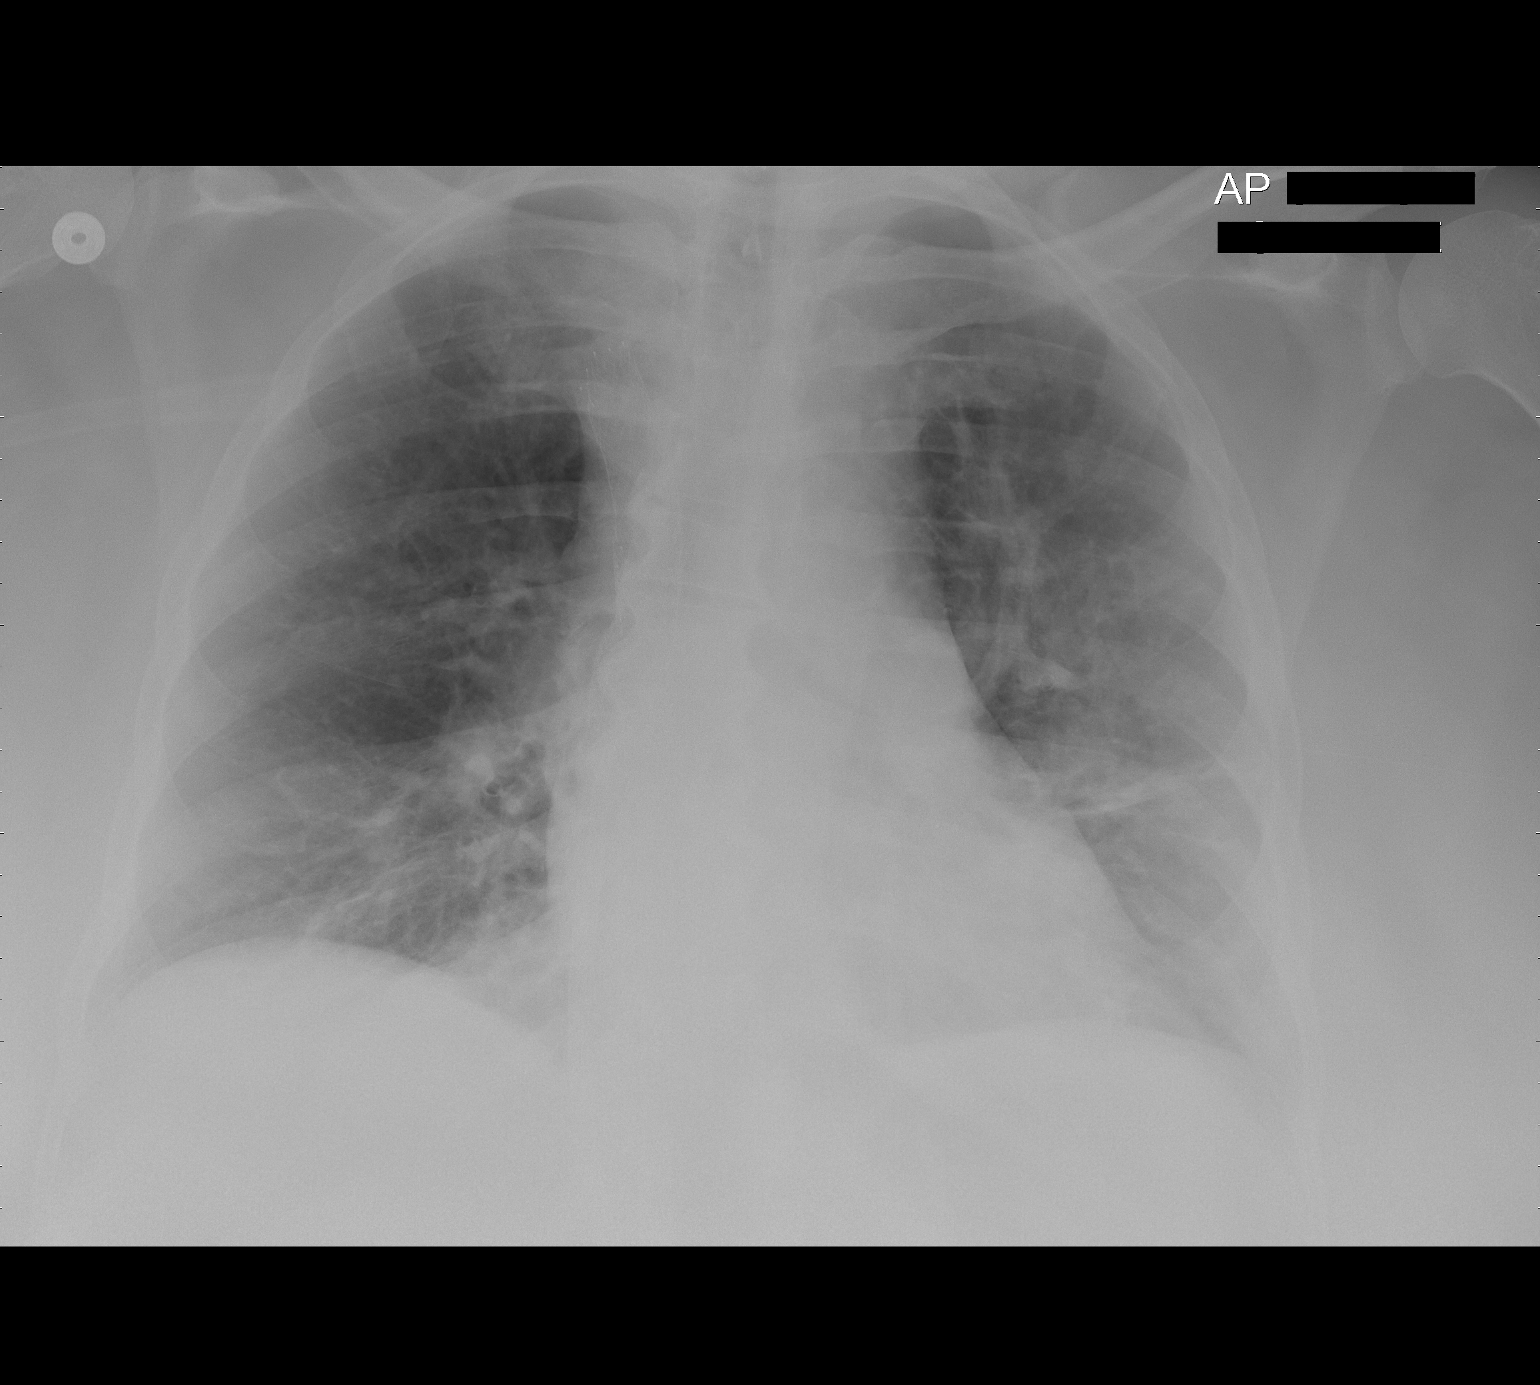

[1 of 1 positions shown; findings below may reference images not displayed]

FINDINGS: Lung volumes lower than on the study earlier today with
some scattered atelectasis including discoid atelectasis in the
left mid lung.  No pulmonary edema or effusion.  Heart size normal.
IMPRESSION: New subsegmental atelectasis.  No other change.

## 2012-02-24 IMAGING — CR DG CHEST 2V
2 series · 2 of 2 positions shown · non-contrast
Comparison: Portable chest x-ray of 07/26/2010

CLINICAL DATA: Preop, end-stage renal disease

CHEST - 2 VIEW

[view not recorded (1 of 2)]
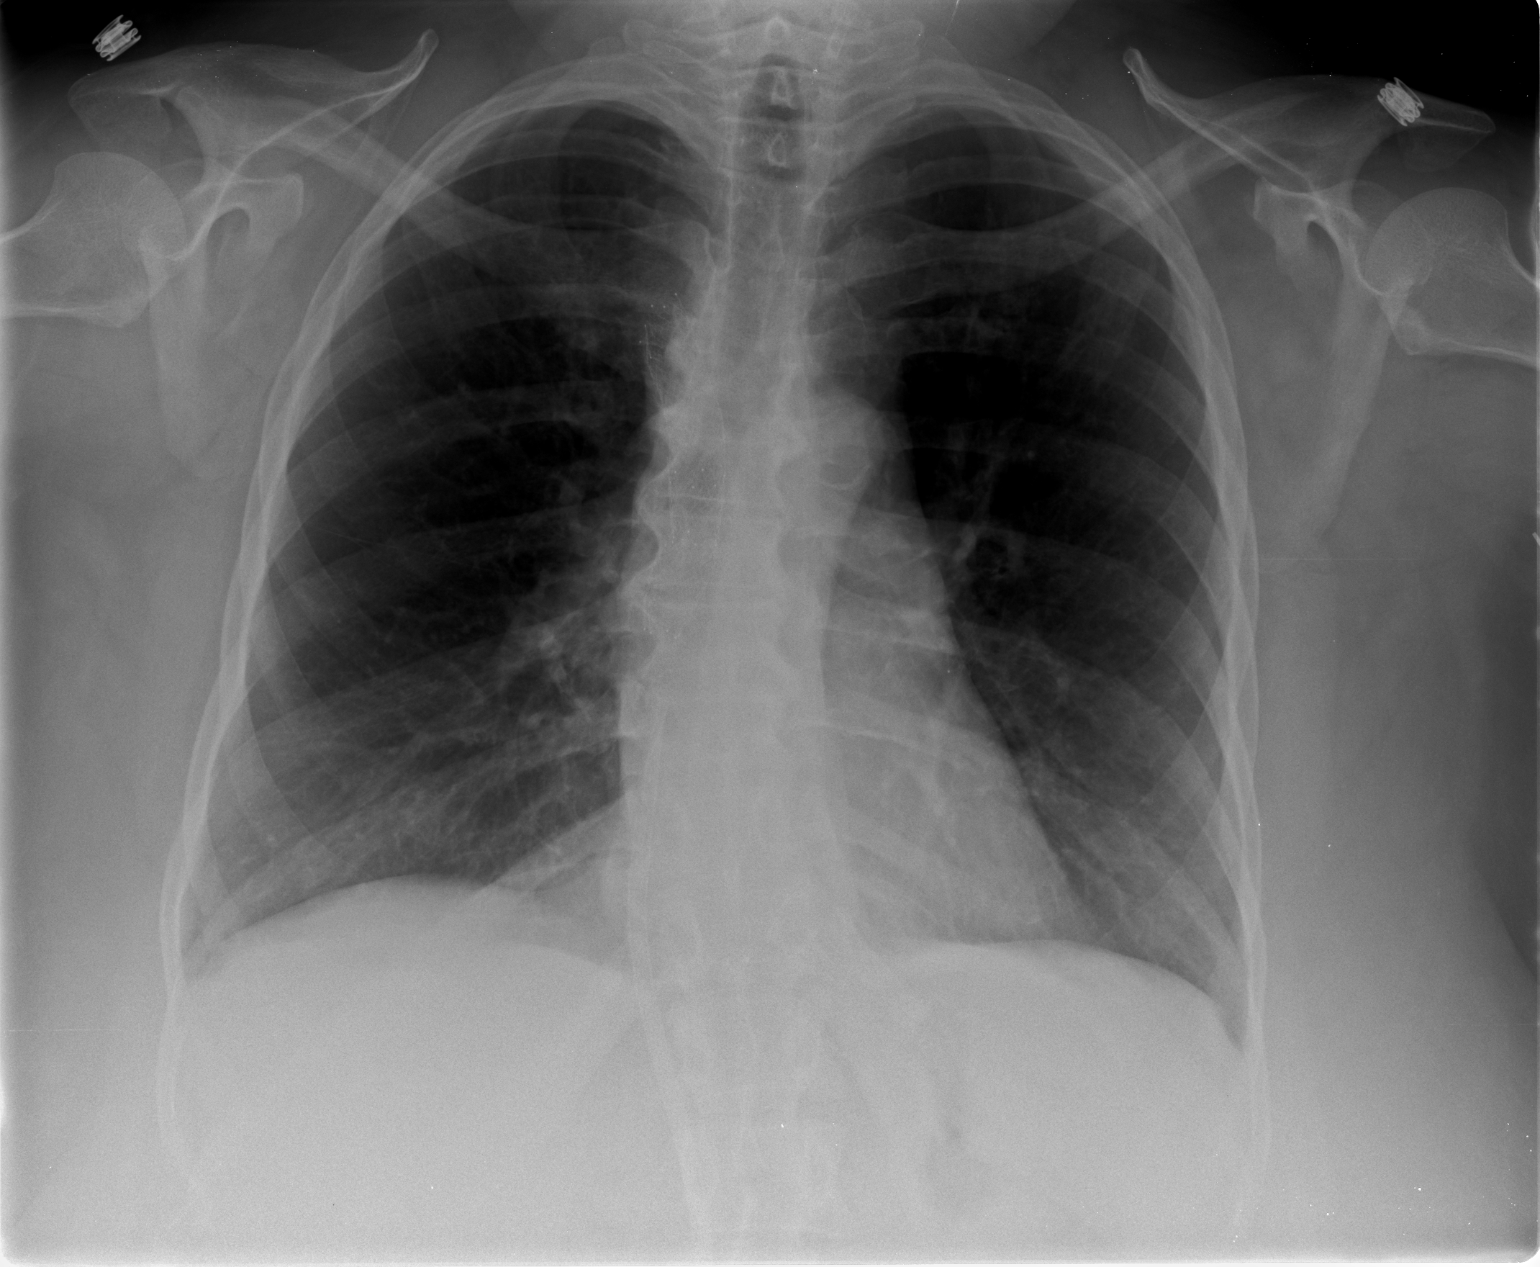

[view not recorded (2 of 2)]
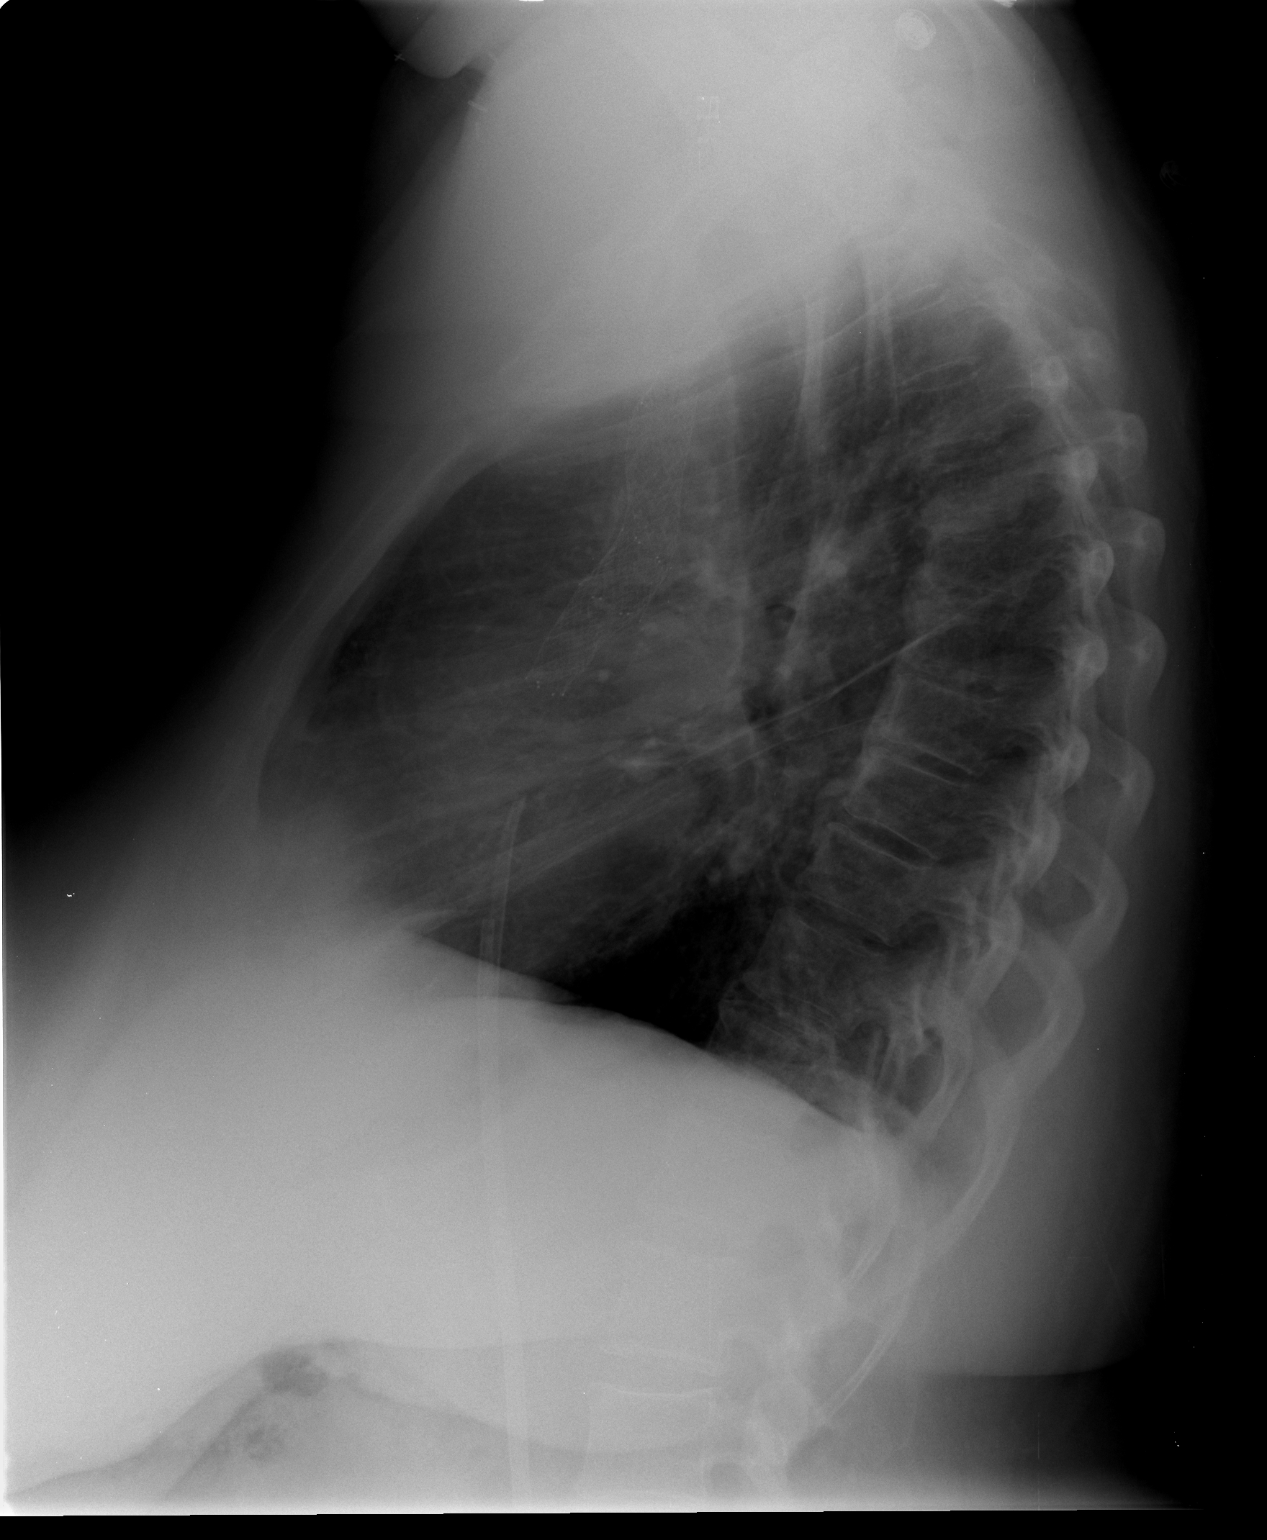

[2 of 2 positions shown; findings below may reference images not displayed]

FINDINGS: The lungs are clear and slightly hyperaerated.  The heart
is within normal limits in size.  There are degenerative changes
throughout thoracic spine.  Venous catheter from below is noted
with the tips within the right atrium.
IMPRESSION: No active lung disease.  Venous catheter is noted with tips in
right atrium.

## 2012-07-21 DIAGNOSIS — Z6841 Body Mass Index (BMI) 40.0 and over, adult: Secondary | ICD-10-CM | POA: Insufficient documentation

## 2012-10-10 DIAGNOSIS — T7840XA Allergy, unspecified, initial encounter: Secondary | ICD-10-CM | POA: Insufficient documentation

## 2013-02-02 ENCOUNTER — Other Ambulatory Visit (HOSPITAL_COMMUNITY): Payer: Self-pay | Admitting: Nephrology

## 2013-02-02 DIAGNOSIS — N186 End stage renal disease: Secondary | ICD-10-CM

## 2013-02-03 ENCOUNTER — Encounter (HOSPITAL_COMMUNITY): Payer: Self-pay

## 2013-02-03 ENCOUNTER — Ambulatory Visit (HOSPITAL_COMMUNITY)
Admission: RE | Admit: 2013-02-03 | Discharge: 2013-02-03 | Disposition: A | Discharge status: Home / Self Care | Payer: Medicare Other | Source: Ambulatory Visit | Attending: Nephrology | Admitting: Nephrology

## 2013-02-03 ENCOUNTER — Other Ambulatory Visit (HOSPITAL_COMMUNITY): Payer: Self-pay | Admitting: Nephrology

## 2013-02-03 DIAGNOSIS — Z992 Dependence on renal dialysis: Secondary | ICD-10-CM | POA: Insufficient documentation

## 2013-02-03 DIAGNOSIS — I120 Hypertensive chronic kidney disease with stage 5 chronic kidney disease or end stage renal disease: Secondary | ICD-10-CM | POA: Insufficient documentation

## 2013-02-03 DIAGNOSIS — E119 Type 2 diabetes mellitus without complications: Secondary | ICD-10-CM | POA: Insufficient documentation

## 2013-02-03 DIAGNOSIS — I868 Varicose veins of other specified sites: Secondary | ICD-10-CM | POA: Insufficient documentation

## 2013-02-03 DIAGNOSIS — T82898A Other specified complication of vascular prosthetic devices, implants and grafts, initial encounter: Secondary | ICD-10-CM | POA: Insufficient documentation

## 2013-02-03 DIAGNOSIS — I871 Compression of vein: Secondary | ICD-10-CM | POA: Insufficient documentation

## 2013-02-03 DIAGNOSIS — Y832 Surgical operation with anastomosis, bypass or graft as the cause of abnormal reaction of the patient, or of later complication, without mention of misadventure at the time of the procedure: Secondary | ICD-10-CM | POA: Insufficient documentation

## 2013-02-03 DIAGNOSIS — N186 End stage renal disease: Secondary | ICD-10-CM | POA: Insufficient documentation

## 2013-02-03 HISTORY — DX: Dependence on renal dialysis: Z99.2

## 2013-02-03 HISTORY — DX: Type 2 diabetes mellitus with other specified complication: E66.9

## 2013-02-03 HISTORY — DX: Essential (primary) hypertension: I10

## 2013-02-03 HISTORY — DX: Dependence on renal dialysis: N18.6

## 2013-02-03 HISTORY — DX: Obesity, unspecified: E11.69

## 2013-02-03 HISTORY — DX: Gastro-esophageal reflux disease without esophagitis: K21.9

## 2013-02-03 HISTORY — DX: Other pulmonary embolism without acute cor pulmonale: I26.99

## 2013-02-03 HISTORY — DX: Morbid (severe) obesity due to excess calories: E66.01

## 2013-02-03 LAB — POCT I-STAT 4, (NA,K, GLUC, HGB,HCT)
Potassium: 6.3 mEq/L (ref 3.5–5.1)
Sodium: 138 mEq/L (ref 135–145)

## 2013-02-03 MED ORDER — HEPARIN SODIUM (PORCINE) 1000 UNIT/ML IJ SOLN
INTRAMUSCULAR | Status: AC
  Filled 2013-02-03: qty 1

## 2013-02-03 MED ORDER — FENTANYL CITRATE 0.05 MG/ML IJ SOLN
INTRAMUSCULAR | Status: AC
  Filled 2013-02-03: qty 4

## 2013-02-03 MED ORDER — MIDAZOLAM HCL 2 MG/2ML IJ SOLN
INTRAMUSCULAR | Status: DC | PRN
  Administered 2013-02-03: 1 mg via INTRAVENOUS

## 2013-02-03 MED ORDER — ALTEPLASE 100 MG IV SOLR
4.0000 mg | INTRAVENOUS | Status: AC
  Administered 2013-02-03: 4 mg
  Filled 2013-02-03: qty 4

## 2013-02-03 MED ORDER — MIDAZOLAM HCL 2 MG/2ML IJ SOLN
INTRAMUSCULAR | Status: AC
  Filled 2013-02-03: qty 4

## 2013-02-03 MED ORDER — HEPARIN SODIUM (PORCINE) 1000 UNIT/ML IJ SOLN
INTRAMUSCULAR | Status: DC | PRN
  Administered 2013-02-03: 2000 [IU] via INTRAVENOUS
  Administered 2013-02-03: 3000 [IU] via INTRAVENOUS

## 2013-02-03 MED ORDER — IOHEXOL 300 MG/ML  SOLN
100.0000 mL | Freq: Once | INTRAMUSCULAR | Status: AC | PRN
  Administered 2013-02-03: 80 mL via INTRAVENOUS

## 2013-02-03 MED ORDER — FENTANYL CITRATE 0.05 MG/ML IJ SOLN
INTRAMUSCULAR | Status: DC | PRN
  Administered 2013-02-03: 50 ug via INTRAVENOUS

## 2013-02-03 NOTE — H&P (Signed)
[name] is an 52 y.o. female.   Chief Complaint: ESRD, now with clotted left thigh graft HPI: 52 y/o F with history of ESRD, post placement of multiple dialysis grafts, now with clotted left thigh graft.  Last dialysis Thurs, 2/20.  Last shuntogram performed 01/02/2012.  Otherwise is without complaint.  No fever, chills, CP or SOB.  Remote history of contrast RXN, though has had contrasted examinations without incident after steriod prep.  Has completed 13 hr steroid prep.  Past Medical History  Diagnosis Date  . HTN (hypertension)   . ESRD (end stage renal disease) on dialysis   . Obesities, morbid   . Diabetes mellitus type 2 in obese   . Iron deficiency   . GERD (gastroesophageal reflux disease)   . OSA (obstructive sleep apnea)   . PE (pulmonary embolism)     No past surgical history on file.  No family history on file. Social History:  has no tobacco, alcohol, and drug history on file.  Allergies:  Allergies  Allergen Reactions  . Amoxicillin Anaphylaxis    Throat closes and gets sweaty.  . Iohexol      Desc: scratchy/itchy throat/itching on back / nausea      (Not in a hospital admission)  No results found for this or any previous visit (from the past 48 hour(s)). No results found.  Review of Systems  Constitutional: Negative.   Respiratory: Negative.   Cardiovascular: Negative.   Psychiatric/Behavioral: Negative.     Blood pressure 127/80, pulse 69, temperature 97.2 F (36.2 C), temperature source Oral, resp. rate 16, SpO2 99.00%. Physical Exam  Constitutional: She is oriented to person, place, and time. She appears well-developed.  Cardiovascular: Normal rate, regular rhythm and normal heart sounds.   Respiratory: Effort normal and breath sounds normal.  Neurological: She is alert and oriented to person, place, and time.  Psychiatric: She has a normal mood and affect.     Assessment/Plan 52 y/o F with history of ESRD, now with clotted left thigh  AVG.  Informed written consent for declot procedure with possible stent and/or HD catheter placement obtained after a discussion of the benefits and risks (including but not limited to bleeding/vessel injury and infection).  [name] 02/03/2013, 9:06 AM

## 2013-02-03 NOTE — ED Notes (Signed)
K+ 6.3 reported to [facility], pt to go to HD today

## 2013-02-03 NOTE — Procedures (Signed)
Technically successful declot of left thigh dialysis graft requiring placement of a covered stent.  No immediate post procedural complications.

## 2013-03-26 ENCOUNTER — Emergency Department (HOSPITAL_COMMUNITY): Payer: Medicare Other

## 2013-03-26 ENCOUNTER — Encounter (HOSPITAL_COMMUNITY): Payer: Self-pay | Admitting: Emergency Medicine

## 2013-03-26 ENCOUNTER — Inpatient Hospital Stay (HOSPITAL_COMMUNITY)
Admission: EM | Admit: 2013-03-26 | Discharge: 2013-03-31 | DRG: 193 | Disposition: A | Discharge status: Home / Self Care | Payer: Medicare Other | Attending: Internal Medicine | Admitting: Internal Medicine

## 2013-03-26 DIAGNOSIS — E1169 Type 2 diabetes mellitus with other specified complication: Secondary | ICD-10-CM | POA: Diagnosis not present

## 2013-03-26 DIAGNOSIS — N2581 Secondary hyperparathyroidism of renal origin: Secondary | ICD-10-CM

## 2013-03-26 DIAGNOSIS — R059 Cough, unspecified: Secondary | ICD-10-CM

## 2013-03-26 DIAGNOSIS — G253 Myoclonus: Secondary | ICD-10-CM | POA: Diagnosis not present

## 2013-03-26 DIAGNOSIS — R05 Cough: Secondary | ICD-10-CM | POA: Diagnosis present

## 2013-03-26 DIAGNOSIS — J189 Pneumonia, unspecified organism: Secondary | ICD-10-CM

## 2013-03-26 DIAGNOSIS — Z992 Dependence on renal dialysis: Secondary | ICD-10-CM

## 2013-03-26 DIAGNOSIS — E119 Type 2 diabetes mellitus without complications: Secondary | ICD-10-CM

## 2013-03-26 DIAGNOSIS — I120 Hypertensive chronic kidney disease with stage 5 chronic kidney disease or end stage renal disease: Secondary | ICD-10-CM | POA: Diagnosis present

## 2013-03-26 DIAGNOSIS — Z91199 Patient's noncompliance with other medical treatment and regimen due to unspecified reason: Secondary | ICD-10-CM

## 2013-03-26 DIAGNOSIS — Z79899 Other long term (current) drug therapy: Secondary | ICD-10-CM

## 2013-03-26 DIAGNOSIS — R197 Diarrhea, unspecified: Secondary | ICD-10-CM

## 2013-03-26 DIAGNOSIS — Z888 Allergy status to other drugs, medicaments and biological substances status: Secondary | ICD-10-CM

## 2013-03-26 DIAGNOSIS — Z8249 Family history of ischemic heart disease and other diseases of the circulatory system: Secondary | ICD-10-CM

## 2013-03-26 DIAGNOSIS — E162 Hypoglycemia, unspecified: Secondary | ICD-10-CM

## 2013-03-26 DIAGNOSIS — Z9119 Patient's noncompliance with other medical treatment and regimen: Secondary | ICD-10-CM

## 2013-03-26 DIAGNOSIS — D631 Anemia in chronic kidney disease: Secondary | ICD-10-CM | POA: Diagnosis present

## 2013-03-26 DIAGNOSIS — G4733 Obstructive sleep apnea (adult) (pediatric): Secondary | ICD-10-CM | POA: Diagnosis present

## 2013-03-26 DIAGNOSIS — Z794 Long term (current) use of insulin: Secondary | ICD-10-CM

## 2013-03-26 DIAGNOSIS — N186 End stage renal disease: Secondary | ICD-10-CM

## 2013-03-26 DIAGNOSIS — Z6841 Body Mass Index (BMI) 40.0 and over, adult: Secondary | ICD-10-CM

## 2013-03-26 DIAGNOSIS — Z8711 Personal history of peptic ulcer disease: Secondary | ICD-10-CM

## 2013-03-26 DIAGNOSIS — Z713 Dietary counseling and surveillance: Secondary | ICD-10-CM

## 2013-03-26 DIAGNOSIS — I9589 Other hypotension: Secondary | ICD-10-CM | POA: Diagnosis present

## 2013-03-26 DIAGNOSIS — J9600 Acute respiratory failure, unspecified whether with hypoxia or hypercapnia: Secondary | ICD-10-CM

## 2013-03-26 DIAGNOSIS — Z881 Allergy status to other antibiotic agents status: Secondary | ICD-10-CM

## 2013-03-26 DIAGNOSIS — K219 Gastro-esophageal reflux disease without esophagitis: Secondary | ICD-10-CM | POA: Diagnosis present

## 2013-03-26 DIAGNOSIS — Z86711 Personal history of pulmonary embolism: Secondary | ICD-10-CM

## 2013-03-26 DIAGNOSIS — Z833 Family history of diabetes mellitus: Secondary | ICD-10-CM

## 2013-03-26 DIAGNOSIS — R509 Fever, unspecified: Secondary | ICD-10-CM

## 2013-03-26 DIAGNOSIS — D638 Anemia in other chronic diseases classified elsewhere: Secondary | ICD-10-CM | POA: Diagnosis present

## 2013-03-26 HISTORY — DX: Iron deficiency anemia, unspecified: D50.9

## 2013-03-26 HISTORY — DX: Obstructive sleep apnea (adult) (pediatric): G47.33

## 2013-03-26 HISTORY — DX: Pneumonia, unspecified organism: J18.9

## 2013-03-26 HISTORY — DX: Compression of vein: I87.1

## 2013-03-26 HISTORY — DX: Dependence on other enabling machines and devices: Z99.89

## 2013-03-26 HISTORY — DX: Shortness of breath: R06.02

## 2013-03-26 LAB — CBC WITH DIFFERENTIAL/PLATELET
Basophils Relative: 0 % (ref 0–1)
Eosinophils Absolute: 0.1 10*3/uL (ref 0.0–0.7)
HCT: 36.3 % (ref 36.0–46.0)
Hemoglobin: 11.9 g/dL — ABNORMAL LOW (ref 12.0–15.0)
Lymphs Abs: 0.6 10*3/uL — ABNORMAL LOW (ref 0.7–4.0)
MCH: 29.6 pg (ref 26.0–34.0)
MCHC: 32.8 g/dL (ref 30.0–36.0)
Monocytes Absolute: 0.4 10*3/uL (ref 0.1–1.0)
Monocytes Relative: 5 % (ref 3–12)
Neutro Abs: 5.8 10*3/uL (ref 1.7–7.7)
RBC: 4.02 MIL/uL (ref 3.87–5.11)

## 2013-03-26 LAB — LACTIC ACID, PLASMA: Lactic Acid, Venous: 1.3 mmol/L (ref 0.5–2.2)

## 2013-03-26 LAB — BASIC METABOLIC PANEL
BUN: 12 mg/dL (ref 6–23)
Chloride: 97 mEq/L (ref 96–112)
Creatinine, Ser: 4.24 mg/dL — ABNORMAL HIGH (ref 0.50–1.10)
GFR calc Af Amer: 13 mL/min — ABNORMAL LOW (ref >90)
Glucose, Bld: 108 mg/dL — ABNORMAL HIGH (ref 70–99)

## 2013-03-26 MED ORDER — CALCIUM ACETATE 667 MG PO CAPS
1334.0000 mg | ORAL_CAPSULE | Freq: Three times a day (TID) | ORAL | Status: DC
  Administered 2013-03-27 – 2013-03-31 (×11): 1334 mg via ORAL
  Filled 2013-03-26 (×16): qty 2

## 2013-03-26 MED ORDER — METOCLOPRAMIDE HCL 5 MG PO TABS
5.0000 mg | ORAL_TABLET | Freq: Every day | ORAL | Status: DC
  Administered 2013-03-26 – 2013-03-28 (×3): 5 mg via ORAL
  Filled 2013-03-26 (×6): qty 1

## 2013-03-26 MED ORDER — LEVOFLOXACIN IN D5W 750 MG/150ML IV SOLN
750.0000 mg | INTRAVENOUS | Status: DC
  Administered 2013-03-26: 750 mg via INTRAVENOUS
  Filled 2013-03-26 (×2): qty 150

## 2013-03-26 MED ORDER — ENOXAPARIN SODIUM 100 MG/ML ~~LOC~~ SOLN: 100.0000 mg | Freq: Two times a day (BID) | SUBCUTANEOUS | Status: DC

## 2013-03-26 MED ORDER — SODIUM CHLORIDE 0.9 % IJ SOLN: 3.0000 mL | INTRAMUSCULAR | Status: DC | PRN

## 2013-03-26 MED ORDER — ALBUTEROL SULFATE HFA 108 (90 BASE) MCG/ACT IN AERS
2.0000 | INHALATION_SPRAY | Freq: Four times a day (QID) | RESPIRATORY_TRACT | Status: DC | PRN
  Filled 2013-03-26: qty 6.7

## 2013-03-26 MED ORDER — SODIUM CHLORIDE 0.9 % IV SOLN
250.0000 mg | Freq: Two times a day (BID) | INTRAVENOUS | Status: DC
  Administered 2013-03-27 – 2013-03-30 (×7): 250 mg via INTRAVENOUS
  Filled 2013-03-26 (×10): qty 250

## 2013-03-26 MED ORDER — LOPERAMIDE HCL 2 MG PO CAPS
2.0000 mg | ORAL_CAPSULE | ORAL | Status: DC | PRN
  Administered 2013-03-26: 2 mg via ORAL
  Filled 2013-03-26 (×2): qty 1

## 2013-03-26 MED ORDER — ONDANSETRON HCL 4 MG/2ML IJ SOLN: 4.0000 mg | Freq: Three times a day (TID) | INTRAMUSCULAR | Status: AC | PRN

## 2013-03-26 MED ORDER — WARFARIN SODIUM 5 MG PO TABS: 5.0000 mg | ORAL_TABLET | Freq: Every day | ORAL | Status: DC

## 2013-03-26 MED ORDER — HEPARIN (PORCINE) IN NACL 100-0.45 UNIT/ML-% IJ SOLN
1350.0000 [IU]/h | INTRAMUSCULAR | Status: DC
  Administered 2013-03-26: 1100 [IU]/h via INTRAVENOUS
  Administered 2013-03-27: 1350 [IU]/h via INTRAVENOUS
  Filled 2013-03-26 (×3): qty 250

## 2013-03-26 MED ORDER — INSULIN GLARGINE 100 UNIT/ML ~~LOC~~ SOLN
35.0000 [IU] | Freq: Every day | SUBCUTANEOUS | Status: DC
  Administered 2013-03-26 – 2013-03-28 (×3): 35 [IU] via SUBCUTANEOUS
  Filled 2013-03-26 (×4): qty 0.35

## 2013-03-26 MED ORDER — HYDROCODONE-ACETAMINOPHEN 5-325 MG PO TABS
1.0000 | ORAL_TABLET | ORAL | Status: DC | PRN
  Administered 2013-03-27 – 2013-03-29 (×3): 2 via ORAL
  Administered 2013-03-29 – 2013-03-30 (×3): 1 via ORAL
  Administered 2013-03-31: 2 via ORAL
  Filled 2013-03-26: qty 2
  Filled 2013-03-26: qty 1
  Filled 2013-03-26: qty 2
  Filled 2013-03-26 (×2): qty 1
  Filled 2013-03-26: qty 2
  Filled 2013-03-26: qty 1
  Filled 2013-03-26: qty 2
  Filled 2013-03-26: qty 1

## 2013-03-26 MED ORDER — SODIUM CHLORIDE 0.9 % IV BOLUS (SEPSIS)
500.0000 mL | Freq: Once | INTRAVENOUS | Status: AC
  Administered 2013-03-26: 500 mL via INTRAVENOUS

## 2013-03-26 MED ORDER — SODIUM CHLORIDE 0.9 % IJ SOLN
3.0000 mL | Freq: Two times a day (BID) | INTRAMUSCULAR | Status: DC
  Administered 2013-03-28 – 2013-03-30 (×2): 3 mL via INTRAVENOUS

## 2013-03-26 MED ORDER — VANCOMYCIN HCL 500 MG IV SOLR
500.0000 mg | Freq: Once | INTRAVENOUS | Status: AC
  Administered 2013-03-26: 500 mg via INTRAVENOUS
  Filled 2013-03-26: qty 500

## 2013-03-26 MED ORDER — LEVOFLOXACIN IN D5W 500 MG/100ML IV SOLN
500.0000 mg | Freq: Once | INTRAVENOUS | Status: AC
  Administered 2013-03-26: 500 mg via INTRAVENOUS
  Filled 2013-03-26: qty 100

## 2013-03-26 MED ORDER — ONDANSETRON HCL 4 MG PO TABS: 4.0000 mg | ORAL_TABLET | Freq: Four times a day (QID) | ORAL | Status: DC | PRN

## 2013-03-26 MED ORDER — ACETAMINOPHEN 650 MG RE SUPP: 650.0000 mg | Freq: Four times a day (QID) | RECTAL | Status: DC | PRN

## 2013-03-26 MED ORDER — SODIUM CHLORIDE 0.9 % IV SOLN
INTRAVENOUS | Status: DC
  Administered 2013-03-26: 23:00:00 via INTRAVENOUS

## 2013-03-26 MED ORDER — SODIUM CHLORIDE 0.9 % IV SOLN
250.0000 mg | Freq: Once | INTRAVENOUS | Status: AC
  Administered 2013-03-26: 250 mg via INTRAVENOUS
  Filled 2013-03-26: qty 250

## 2013-03-26 MED ORDER — MIDODRINE HCL 5 MG PO TABS
5.0000 mg | ORAL_TABLET | Freq: Two times a day (BID) | ORAL | Status: DC
  Administered 2013-03-26 – 2013-03-31 (×9): 5 mg via ORAL
  Filled 2013-03-26 (×11): qty 1

## 2013-03-26 MED ORDER — ACETAMINOPHEN 325 MG PO TABS: 650.0000 mg | ORAL_TABLET | Freq: Four times a day (QID) | ORAL | Status: DC | PRN

## 2013-03-26 MED ORDER — SODIUM CHLORIDE 0.9 % IV SOLN: 250.0000 mL | INTRAVENOUS | Status: DC | PRN

## 2013-03-26 MED ORDER — SODIUM CHLORIDE 0.9 % IJ SOLN
3.0000 mL | Freq: Two times a day (BID) | INTRAMUSCULAR | Status: DC
  Administered 2013-03-28 – 2013-03-30 (×4): 3 mL via INTRAVENOUS

## 2013-03-26 MED ORDER — ACETAMINOPHEN 500 MG PO TABS
500.0000 mg | ORAL_TABLET | Freq: Once | ORAL | Status: AC
  Administered 2013-03-26: 500 mg via ORAL
  Filled 2013-03-26: qty 1

## 2013-03-26 MED ORDER — MIDODRINE HCL 5 MG PO TABS
5.0000 mg | ORAL_TABLET | Freq: Once | ORAL | Status: AC
  Administered 2013-03-26: 5 mg via ORAL
  Filled 2013-03-26: qty 1

## 2013-03-26 MED ORDER — ONDANSETRON HCL 4 MG/2ML IJ SOLN: 4.0000 mg | Freq: Four times a day (QID) | INTRAMUSCULAR | Status: DC | PRN

## 2013-03-26 MED ORDER — DEXTROMETHORPHAN POLISTIREX 30 MG/5ML PO LQCR
10.0000 mL | Freq: Once | ORAL | Status: AC
  Administered 2013-03-26: 60 mg via ORAL
  Filled 2013-03-26: qty 10

## 2013-03-26 NOTE — ED Notes (Signed)
Report obtained from [facility], care taken over at this time.

## 2013-03-26 NOTE — ED Notes (Signed)
Patient report called to [facility] for admission, nurse voiced understanding of report and had no further questions. Patient is in stable condition at this time for transport. Patient going to room via stretcher to room 6706.

## 2013-03-26 NOTE — ED Notes (Signed)
Unable to gain IV access. ED-P notified

## 2013-03-26 NOTE — Progress Notes (Addendum)
ANTIBIOTIC CONSULT NOTE - INITIAL  Pharmacy Consult for vancomycin/imipenem Indication: rule out pneumonia  Allergies  Allergen Reactions  . Amoxicillin Anaphylaxis    Throat closes and gets sweaty.  . Iohexol      Desc: scratchy/itchy throat/itching on back / nausea     Patient Measurements: Wt=127kg  Vital Signs: Temp: 97.8 F (36.6 C) (04/17 1150) Temp src: Oral (04/17 1150) BP: 80/40 mmHg (04/17 1721) Pulse Rate: 98 (04/17 1718) Intake/Output from previous day:   Intake/Output from this shift:    Labs:  Recent Labs  03/26/13 1420  WBC 6.9  HGB 11.9*  PLT 171  CREATININE 4.24*   CrCl is unknown because there is no height on file for the current visit. No results found for this basename: VANCOTROUGH, VANCOPEAK, VANCORANDOM, GENTTROUGH, GENTPEAK, GENTRANDOM, TOBRATROUGH, TOBRAPEAK, TOBRARND, AMIKACINPEAK, AMIKACINTROU, AMIKACIN,  in the last 72 hours   Microbiology: No results found for this or any previous visit (from the past 720 hour(s)).  Medical History: Past Medical History  Diagnosis Date  . HTN (hypertension)   . ESRD (end stage renal disease) on dialysis   . Obesities, morbid   . Diabetes mellitus type 2 in obese   . Iron deficiency   . GERD (gastroesophageal reflux disease)   . OSA (obstructive sleep apnea)   . PE (pulmonary embolism)     Assessment: 52 yo female here with fever and noted with ESRD on HD and multiple infection of MRSA bacteremia secondary to catheters. Patient to begin imipenem and vancomycin for concern of PNA.  Patient received 2000mg  IV vancomycin at her HD center today.   Goal of Therapy:  Vancomycin trough level 15-20 mcg/ml  Plan:  -Vancomycin 500mg  IV for a total of 2500mg  load -Will continue vancomycin based on hemodialysis schedule -Imipenem 250mg  IV q12h  Hildred Laser, Pharm D 03/26/2013 7:06 PM

## 2013-03-26 NOTE — ED Provider Notes (Signed)
Medical screening examination/treatment/procedure(s) were conducted as a shared visit with non-physician practitioner(s) and myself.  I personally evaluated the patient during the encounter.  51yF w/ ESRD presenting with fever. Temp of 100.5 at dialysis today. Given tylenol, 2g vancomycin, blood cultures drawn and sent to ER. Pt c/o some dyspnea and cough. CXR w/o focal finding, but symptoms concerning for possible pneumonia. Apparently anaphylaxis with amoxicillin. Levaquin ordered. Pt with hypotension. BP 80-90/50-60s. Per pt, she typically runs in 0000000 systolic and takes midodrine which she has not had today.   Virgel Manifold, MD 03/26/13 [phone]

## 2013-03-26 NOTE — ED Provider Notes (Signed)
History     CSN: [id number]  Arrival date & time 03/26/13  1141   First MD Initiated Contact with Patient 03/26/13 1212      Chief Complaint  Patient presents with  . Fever  . Generalized Body Aches  . Fatigue    (Consider location/radiation/quality/duration/timing/severity/associated sxs/prior treatment) HPI Comments: Patient is a 52 year old female with a past medical history of ESRD, s/p kidney transplant, diabetes who presents with fever starting today. Patient reports being at dialysis and having a fever of 100.5. Patient finished her dialysis and was given tylenol and 2g IV Vancomycin. Patient reports associated productive cough, fatigue, and chills. No aggravating/alleviating factors. Symptoms started gradually and progressively worsened. No other associated symptoms. Patient denies recent hospitalizations.    Past Medical History  Diagnosis Date  . HTN (hypertension)   . ESRD (end stage renal disease) on dialysis   . Obesities, morbid   . Diabetes mellitus type 2 in obese   . Iron deficiency   . GERD (gastroesophageal reflux disease)   . OSA (obstructive sleep apnea)   . PE (pulmonary embolism)     History reviewed. No pertinent past surgical history.  History reviewed. No pertinent family history.  History  Substance Use Topics  . Smoking status: Not on file  . Smokeless tobacco: Not on file  . Alcohol Use: Not on file    OB History   Grav Para Term Preterm Abortions TAB SAB Ect Mult Living                  Review of Systems  Constitutional: Positive for fever.  Respiratory: Positive for cough and shortness of breath.   Gastrointestinal: Positive for abdominal pain.  All other systems reviewed and are negative.    Allergies  Amoxicillin and Iohexol  Home Medications   Current Outpatient Rx  Name  Route  Sig  Dispense  Refill  . albuterol (PROVENTIL HFA;VENTOLIN HFA) 108 (90 BASE) MCG/ACT inhaler   Inhalation   Inhale 2 puffs into the lungs  every 6 (six) hours as needed. For shortness of breath.          . calcium acetate (PHOSLO) 667 MG capsule   Oral   Take 1,334 mg by mouth 3 (three) times daily with meals.          . enoxaparin (LOVENOX) 100 MG/ML injection   Subcutaneous   Inject 100 mg into the skin every 12 (twelve) hours. Last dose 02/01/13         . insulin aspart (NOVOLOG FLEXPEN) 100 UNIT/ML injection   Subcutaneous   Inject 12 Units into the skin 3 (three) times daily before meals.         . insulin glargine (LANTUS) 100 UNIT/ML injection   Subcutaneous   Inject 35 Units into the skin at bedtime.           . metoCLOPramide (REGLAN) 5 MG tablet   Oral   Take 5 mg by mouth at bedtime.           . midodrine (PROAMATINE) 5 MG tablet   Oral   Take 5 mg by mouth 2 (two) times daily.           . Multiple Vitamins-Minerals (MULTIVITAMINS THER. W/MINERALS) TABS   Oral   Take 1 tablet by mouth daily.           Marland Kitchen warfarin (COUMADIN) 5 MG tablet   Oral   Take 5 mg by mouth daily. On  hold for procedure           BP 95/57  Pulse 105  Temp(Src) 97.8 F (36.6 C) (Oral)  Resp 20  SpO2 95%  Physical Exam  Nursing note and vitals reviewed. Constitutional: She is oriented to person, place, and time. She appears well-developed and well-nourished. No distress.  HENT:  Head: Normocephalic and atraumatic.  Eyes: Conjunctivae are normal.  Neck: Normal range of motion.  Cardiovascular: Normal rate and regular rhythm.  Exam reveals no gallop and no friction rub.   No murmur heard. Pulmonary/Chest: Effort normal. She has no wheezes. She has no rales. She exhibits no tenderness.  Rhonchi noted in RUL. Occasional rhonchi in other lungs fields.   Abdominal: Soft. She exhibits no distension. There is no tenderness. There is no rebound and no guarding.  Musculoskeletal: Normal range of motion.  Neurological: She is alert and oriented to person, place, and time. Coordination normal.  Speech is  goal-oriented. Moves limbs without ataxia.   Skin: Skin is warm and dry.  Graft site on left anterior thigh is non erythematous and non tender to palpation.   Psychiatric: She has a normal mood and affect. Her behavior is normal.    ED Course  Procedures (including critical care time)  Labs Reviewed  CBC WITH DIFFERENTIAL - Abnormal; Notable for the following:    Hemoglobin 11.9 (*)    Neutrophils Relative 85 (*)    Lymphocytes Relative 8 (*)    Lymphs Abs 0.6 (*)    All other components within normal limits  BASIC METABOLIC PANEL  PROTIME-INR   Dg Chest 2 View  03/26/2013  *RADIOLOGY REPORT*  Clinical Data: Cough, fever, shortness of breath and chest discomfort.  History of hypertension and diabetes.  CHEST - 2 VIEW  Comparison: 10/13/2010.  Findings: 1333 hours.  There is mild patient rotation to the left. Heart size and mediastinal contours are stable.  The lungs are now clear.  There is no pleural effusion or pneumothorax.  Osteophytes are noted throughout the thoracic spine associated with a mild convex right scoliosis.  IMPRESSION: Interval improved aeration of the lungs.  No acute cardiopulmonary process.   Original Report Authenticated By: Richardean Sale, M.D.      No diagnosis found.    MDM  1:10 PM Labs and chest xray pending. Patient will have tylenol for headache and delsym for cough. Patient afebrile with stable vitals at this time.   2:35 PM Chest xray shows no acute changes. I will have the patient admitted for possible pneumonia. Patient's PCP is Dr. Jonelle Sidle.   3:09 PM Patient will be admitted to Dr. Tana Coast when IV access is established.       Alvina Chou, [place] 04/01/13 [phone]

## 2013-03-26 NOTE — ED Notes (Signed)
Pt c/o fever, body aches and fatigue starting today; pt sent over from dialysis

## 2013-03-26 NOTE — ED Notes (Signed)
Patient laying on stretcher at this time. Patient complaining of abdominal pain and diarrhea. Patient had one small brown liquid episode of diarrhea now, cleaned patient up. Patient has antibiotic infusing at this time. No acute distress noted, resp are even and unlabored. Call light at bedside. Will continue to monitor.

## 2013-03-26 NOTE — ED Notes (Signed)
Pt has hx of sleep apnea and uses a CPAP at home. SpO2 drops into 80's when pt is sleeping. Pt placed on mask O2 with humidification. ED-P notified. Waiting on admitting MD

## 2013-03-26 NOTE — ED Notes (Signed)
IV team to start IV

## 2013-03-26 NOTE — H&P (Signed)
[facility] History and Physical  [name] [id number] DOB: 16-Aug-1961 DOA: 03/26/2013  Referring physician: Dr. Doristine Locks PCP: Donetta Potts, MD  Specialists: none  Chief Complaint: fevers  HPI: [name] is a 52 y.o. 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 Blood cultures were obtained was given one dose of vancomycin to the emergency room.she has not been treated recently with any antibiotics, has not been in the hospital in the last 3 months. She relates no sick contacts or any new medications.  In the emergency: Her temperature was checked which has remained high, her blood pressure has remained in his usual in the 90s, a CBC was checked in the does not show an elevation in her white count or left shift, she does have a decrease count her absolute lymphocytic count, but her other hematologic panels are within normal limits. Chest x-ray was done that shows no infiltrates.  Review of Systems: The patient denies anorexia,  weight loss,, vision loss, decreased hearing, hoarseness, chest pain, syncope, dyspnea on exertion, peripheral edema, balance deficits, hemoptysis, abdominal pain, melena, hematochezia, severe indigestion/heartburn, hematuria, incontinence, genital sores, muscle weakness, suspicious skin lesions, transient blindness, difficulty walking, depression, unusual weight change, abnormal bleeding, enlarged lymph nodes, angioedema, and breast masses.    Past Medical History  Diagnosis Date  . HTN  (hypertension)   . ESRD (end stage renal disease) on dialysis   . Obesities, morbid   . Diabetes mellitus type 2 in obese   . Iron deficiency   . GERD (gastroesophageal reflux disease)   . OSA (obstructive sleep apnea)   . PE (pulmonary embolism)    Past Surgical History  Procedure Laterality Date  . Vascular surgery     Social History:  reports that she has never smoked. She does not have any smokeless tobacco history on file. She reports that she does not drink alcohol or use illicit drugs. Lives at home can perform all her ADLs  Allergies  Allergen Reactions  . Amoxicillin Anaphylaxis    Throat closes and gets sweaty.  . Iohexol      Desc: scratchy/itchy throat/itching on back / nausea     Family History  Problem Relation Age of Onset  . Diabetes Mellitus II Mother   . Hypertension Mother   . Hypertension Father   . Diabetes Mellitus II Father     Prior to Admission medications   Medication Sig Start Date End Date Taking? Authorizing Provider  albuterol (PROVENTIL HFA;VENTOLIN HFA) 108 (90 BASE) MCG/ACT inhaler Inhale 2 puffs into the lungs every 6 (six) hours as needed. For shortness of breath.    Yes Historical Provider, MD  calcium acetate (PHOSLO) 667 MG capsule Take 1,334 mg by mouth 3 (three) times daily with meals.    Yes Historical Provider, MD  enoxaparin (LOVENOX) 100 MG/ML injection Inject 100 mg into the skin every 12 (twelve) hours. Last dose 02/01/13   Yes Historical Provider, MD  insulin aspart (NOVOLOG FLEXPEN) 100 UNIT/ML injection Inject 12 Units into the skin 3 (three) times daily before meals.   Yes Historical Provider, MD  insulin glargine (LANTUS) 100 UNIT/ML injection Inject 35 Units into the skin at bedtime.     Yes Historical Provider, MD  metoCLOPramide (REGLAN) 5 MG tablet Take 5 mg by mouth at bedtime.     Yes Historical Provider, MD  midodrine (PROAMATINE) 5 MG tablet Take 5 mg by mouth 2 (two) times daily.     Yes Historical Provider, MD   Multiple Vitamins-Minerals (MULTIVITAMINS THER. W/MINERALS) TABS Take 1 tablet by mouth daily.     Yes Historical Provider, MD  warfarin (COUMADIN) 5 MG tablet Take 5 mg by mouth daily. On hold for procedure   Yes Historical Provider, MD   Physical Exam: Filed Vitals:   03/26/13 1643 03/26/13 1645 03/26/13 1700 03/26/13 1721  BP: 69/38 82/47 91/42  80/40  Pulse: 99 95 93   Temp:      TempSrc:      Resp:      SpO2: 90% 90% 90%     BP 80/40  Pulse 93  Temp(Src) 97.8 F (36.6 C) (Oral)  Resp 20  SpO2 90%  General Appearance:    Alert, cooperative, no distress, appears stated age morbidly obese female  Head:    Normocephalic, without obvious abnormality, atraumatic           Throat:   Lips, mucosa, and tongue normal; teeth and gums normal  Neck:   Supple, symmetrical, trachea midline,very thick neck with multiple scar no carotid   bruit or JVD  Back:     Symmetric, no curvature, ROM normal, no CVA tenderness  Lungs:     Good air movement with rhonchi bilaterally.  Chest Wall:    No tenderness or deformity   Heart:    Regular rate and rhythm, S1 and S2 normal, no murmur, rub   or gallop     Abdomen:     Soft, non-tender, bowel sounds active all four quadrants,    no masses, no organomegaly        Extremities:   Extremities normal, atraumatic, no cyanosis or edema  Pulses:   2+ and symmetric all extremities  Skin:   Skin color, texture, turgor normal, no rashes or lesions around the graft area on her left thigh  Lymph nodes:   Cervical, supraclavicular, and axillary nodes normal  Neurologic:   CNII-XII intact, normal strength, sensation and reflexes    throughout    Labs on Admission:  Basic Metabolic Panel:  Recent Labs Lab 03/26/13 1420  NA 137  K 3.3*  CL 97  CO2 30  GLUCOSE 108*  BUN 12  CREATININE 4.24*  CALCIUM 9.6   Liver Function Tests: No results found for this basename: AST, ALT, ALKPHOS, BILITOT, PROT, ALBUMIN,  in the last 168 hours No results  found for this basename: LIPASE, AMYLASE,  in the last 168 hours No results found for this basename: AMMONIA,  in the last 168 hours CBC:  Recent Labs Lab 03/26/13 1420  WBC 6.9  NEUTROABS 5.8  HGB 11.9*  HCT 36.3  MCV 90.3  PLT 171   Cardiac Enzymes: No results found for this basename: CKTOTAL, CKMB, CKMBINDEX, TROPONINI,  in the last 168 hours  BNP (last 3 results) No results found for this basename: PROBNP,  in the last 8760 hours CBG: No results found for this basename: GLUCAP,  in the last 168 hours  Radiological Exams on Admission: Dg Chest 2 View  03/26/2013  *RADIOLOGY REPORT*  Clinical Data: Cough, fever, shortness of breath and chest discomfort.  History  of hypertension and diabetes.  CHEST - 2 VIEW  Comparison: 10/13/2010.  Findings: 1333 hours.  There is mild patient rotation to the left. Heart size and mediastinal contours are stable.  The lungs are now clear.  There is no pleural effusion or pneumothorax.  Osteophytes are noted throughout the thoracic spine associated with a mild convex right scoliosis.  IMPRESSION: Interval improved aeration of the lungs.  No acute cardiopulmonary process.   Original Report Authenticated By: Richardean Sale, M.D.     EKG: Independently reviewed. none  Assessment/Plan Fever and chills/Cough: - Her blood pressure has been stable  in ED she she takes Midodrine for hypotension, she was given one dose of vancomycin at the dialysis center and blood cultures were ordered. - I will start treatment for health care associated-acquired pneumonia with vancomycin and cefepimeHer chest x-ray does not show an infiltrate, she does not have elevated white count on her CBC. She continues to spike fevers and the emergency room. I will place her on Tylenol for this. - Sputum cultures.     ESRD on hemodialysis: - consult renal for further dialysis.    Diarrhea - She denies any recent antibiotic use. I will check a C. Difficile is high risk.    OSA  (obstructive sleep apnea) - she denies CPAP at home.  Anemia of chronic disease - stable management per renal.  Secondary hyperparathyroidism - Stable management per renal.  Insulin-requiring or dependent type II diabetes mellitus: - I will continue her current dose of Lantus and start her on sliding scale insulin in house. Nephrology  Code Status: full Family Communication: none Disposition Plan: Inpatient 3-4 days.  Time spent: [place], ABRAHAM [facility] Pager [phone]  If 7PM-7AM, please contact night-coverage [email or web address] Password [facility] 03/26/2013, 6:04 PM

## 2013-03-26 NOTE — Progress Notes (Addendum)
ANTICOAGULATION CONSULT NOTE - Initial Consult  Pharmacy Consult for coumadin/lovenox Indication: history of PE  Allergies  Allergen Reactions  . Amoxicillin Anaphylaxis    Throat closes and gets sweaty.  . Iohexol      Desc: scratchy/itchy throat/itching on back / nausea     Patient Measurements: Wt= 127.5kg (per last HD record) Ht = 5' 6'' IBW= 59.3kg Heparin dosing weight= 90kg  Vital Signs: Temp: 97.7 F (36.5 C) (04/17 2211) Temp src: Oral (04/17 2211) BP: 71/27 mmHg (04/17 2211) Pulse Rate: 81 (04/17 2211)  Labs:  Recent Labs  03/26/13 1420 03/26/13 1445  HGB 11.9*  --   HCT 36.3  --   PLT 171  --   LABPROT  --  14.1  INR  --  1.10  CREATININE 4.24*  --     CrCl is unknown because there is no height on file for the current visit.   Medical History: Past Medical History  Diagnosis Date  . HTN (hypertension)   . ESRD (end stage renal disease) on dialysis   . Obesities, morbid   . Diabetes mellitus type 2 in obese   . Iron deficiency   . GERD (gastroesophageal reflux disease)   . OSA (obstructive sleep apnea)   . 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  Patient states she has been using lovenox daily at home and last dose was pm of 03/25/13.  She states she has not been taking coumadin for a month.  I am unable to locate records indicating use of lovenox and the pharmacy where the patient says she fills this has not recent record of lovenox use.  Records were also send from the [facility] and I did not see any mention of lovenox.  INR is 1.1 so she has clearly been off coumadin.  At this time I have not been able to acquire further information about her anticoagulation regimen.  Spoke to T. Rogue Bussing NP and plan will be for heparin (due to ESRD) and team will try to clarify outpatient regimen on 03/27/13.    Goal of Therapy:  INR 2-3 Heparin level 0.3-0.7 units/ml Monitor platelets by  anticoagulation protocol: Yes   Plan:  -Hold coumadin for now until clarification of outpatient anticoagulation regimen can be obtained -No heparin bolus due to patient stating she administered lovenox last pm -Will start heparin infusion at 1100 units/hr -Heparin level in 8hrs and daily with CBC daily  Hildred Laser, Pharm D 03/26/2013 10:39 PM

## 2013-03-27 DIAGNOSIS — J189 Pneumonia, unspecified organism: Principal | ICD-10-CM

## 2013-03-27 LAB — HEPARIN LEVEL (UNFRACTIONATED)
Heparin Unfractionated: <0.1 IU/mL — ABNORMAL LOW (ref 0.30–0.70)
Heparin Unfractionated: <0.1 IU/mL — ABNORMAL LOW (ref 0.30–0.70)

## 2013-03-27 LAB — GLUCOSE, CAPILLARY: Glucose-Capillary: 104 mg/dL — ABNORMAL HIGH (ref 70–99)

## 2013-03-27 LAB — CBC
Hemoglobin: 10.7 g/dL — ABNORMAL LOW (ref 12.0–15.0)
MCHC: 31 g/dL (ref 30.0–36.0)
RDW: 14.2 % (ref 11.5–15.5)
WBC: 5.7 10*3/uL (ref 4.0–10.5)

## 2013-03-27 LAB — COMPREHENSIVE METABOLIC PANEL
Alkaline Phosphatase: 129 U/L — ABNORMAL HIGH (ref 39–117)
BUN: 21 mg/dL (ref 6–23)
Calcium: 9 mg/dL (ref 8.4–10.5)
Creatinine, Ser: 6.1 mg/dL — ABNORMAL HIGH (ref 0.50–1.10)
GFR calc Af Amer: 8 mL/min — ABNORMAL LOW (ref >90)
Glucose, Bld: 73 mg/dL (ref 70–99)
Potassium: 4 mEq/L (ref 3.5–5.1)
Total Protein: 7.8 g/dL (ref 6.0–8.3)

## 2013-03-27 LAB — HIV ANTIBODY (ROUTINE TESTING W REFLEX): HIV: NONREACTIVE

## 2013-03-27 LAB — HEMOGLOBIN A1C: Hgb A1c MFr Bld: 6.6 % — ABNORMAL HIGH (ref <5.7)

## 2013-03-27 MED ORDER — VANCOMYCIN HCL IN DEXTROSE 1-5 GM/200ML-% IV SOLN
1000.0000 mg | INTRAVENOUS | Status: DC
  Administered 2013-03-28 – 2013-03-31 (×2): 1000 mg via INTRAVENOUS
  Filled 2013-03-27 (×3): qty 200

## 2013-03-27 MED ORDER — HEPARIN BOLUS VIA INFUSION
2400.0000 [IU] | Freq: Once | INTRAVENOUS | Status: AC
  Administered 2013-03-27: 2400 [IU] via INTRAVENOUS
  Filled 2013-03-27: qty 2400

## 2013-03-27 MED ORDER — DOXERCALCIFEROL 4 MCG/2ML IV SOLN
1.0000 ug | INTRAVENOUS | Status: DC
  Filled 2013-03-27 (×2): qty 2

## 2013-03-27 MED ORDER — LEVOFLOXACIN IN D5W 500 MG/100ML IV SOLN
500.0000 mg | INTRAVENOUS | Status: AC
  Administered 2013-03-28: 500 mg via INTRAVENOUS
  Filled 2013-03-27: qty 100

## 2013-03-27 MED ORDER — HEPARIN (PORCINE) IN NACL 100-0.45 UNIT/ML-% IJ SOLN
1850.0000 [IU]/h | INTRAMUSCULAR | Status: DC
  Administered 2013-03-27: 1650 [IU]/h via INTRAVENOUS
  Administered 2013-03-28 (×2): 1750 [IU]/h via INTRAVENOUS
  Administered 2013-03-28: 1650 [IU]/h via INTRAVENOUS
  Administered 2013-03-29: 1850 [IU]/h via INTRAVENOUS
  Filled 2013-03-27 (×4): qty 250

## 2013-03-27 NOTE — Progress Notes (Signed)
Pt bp 76/44 pulse 77. Pt asymptomatic and denies any distress. MD was made aware no new orders received.

## 2013-03-27 NOTE — Progress Notes (Signed)
Utilization review completed.  P.J. Arnesia Vincelette,RN,BSN Case Manager [phone]  

## 2013-03-27 NOTE — Progress Notes (Signed)
ANTICOAGULATION CONSULT NOTE - Follow Up Consult  Pharmacy Consult for Heparin  Indication: hx of PE  Allergies  Allergen Reactions  . Amoxicillin Anaphylaxis    Throat closes and gets sweaty.  . Iohexol      Desc: scratchy/itchy throat/itching on back / nausea    Patient Measurements: Height: 5\' 2"  (157.5 cm) Weight: 276 lb 3.8 oz (125.301 kg) IBW/kg (Calculated) : 50.1 Heparin Dosing Weight: 81.4 kg  Vital Signs: Temp: 98.5 F (36.9 C) (04/18 2142) Temp src: Oral (04/18 2142) BP: 87/34 mmHg (04/18 2142) Pulse Rate: 74 (04/18 2142)   Recent Labs  03/26/13 1420 03/26/13 1445 03/27/13 1032 03/27/13 1125 03/27/13 2128  HGB 11.9*  --  10.7*  --   --   HCT 36.3  --  34.5*  --   --   PLT 171  --  168  --   --   LABPROT  --  14.1  --   --   --   INR  --  1.10  --   --   --   HEPARINUNFRC  --   --   --  <0.10* <0.10*  CREATININE 4.24*  --  6.10*  --   --     Estimated Creatinine Clearance: 13.7 ml/min (by C-G formula based on Cr of 6.1).   Assessment: 76 yoF with ESRD-HD-TTSa continuing on IV heparin for history of PE.  Heparin level is still subtherapeutic.  No bleeding reported and no issues with infusion stopping or line per RN.   Goal of Therapy:  Heparin level 0.3-0.7 units/ml Monitor platelets by anticoagulation protocol: Yes   Plan:  1) Heparin bolus IV 2400 units x 1, then increase to 1650 units/hr 2) Check heparin level in am, then daily HL and CBC while on heparin  Jerrye Beavers, PharmD, [place] Clinical Pharmacist  Pager: [phone]

## 2013-03-27 NOTE — Evaluation (Signed)
Physical Therapy Evaluation Patient Details Name: [name] MRN: [id number] DOB: Sep 13, 1961 Today's Date: 03/27/2013 Time: [id number] PT Time Calculation (min): 17 min  PT Assessment / Plan / Recommendation Clinical Impression  Pt is a 52 y/o female with history of renal failure.  Pt was independent with mobility at home prior to admission and appears to be performing at her baseline level of function.  No current acute or follow-up PT needs.  PT signing off.     PT Assessment  Patent does not need any further PT services    Follow Up Recommendations  No PT follow up    Does the patient have the potential to tolerate intense rehabilitation      Barriers to Discharge        Equipment Recommendations  None recommended by PT    Recommendations for Other Services     Frequency      Precautions / Restrictions Precautions Precautions: None Restrictions Weight Bearing Restrictions: No   Pertinent Vitals/Pain No c/o pain.  SpO2 sats >95% throughout session         Mobility  Bed Mobility Bed Mobility: Supine to Sit;Sit to Supine Supine to Sit: 7: Independent;HOB flat Sit to Supine: 7: Independent;HOB flat Transfers Transfers: Sit to Stand;Stand to Sit Sit to Stand: 7: Independent;From bed Stand to Sit: 7: Independent;To bed Ambulation/Gait Ambulation/Gait Assistance: 5: Supervision Ambulation Distance (Feet): 100 Feet Assistive device: None Ambulation/Gait Assistance Details: [name] for safety.   Gait Pattern: Within Functional Limits Stairs: No    Exercises     PT Diagnosis:    PT Problem List:   PT Treatment Interventions:     PT Goals Acute Rehab PT Goals PT Goal Formulation: With patient  Visit Information  Last PT Received On: 03/27/13 Assistance Needed: +1    Subjective Data  Subjective: agree to eval Patient Stated Goal: none stated   Prior Functioning  Home Living Lives With: Significant other;Other (Comment) (Sister) Available Help  at Discharge: Family;Available 24 hours/day Type of Home: House Home Access: Ramped entrance Home Layout: One level Bathroom Shower/Tub: Chiropodist: Standard Home Adaptive Equipment: None Prior Function Level of Independence: Independent Driving: No Vocation: On disability Communication Communication: No difficulties Dominant Hand: Right    Cognition  Cognition Arousal/Alertness: Awake/alert Behavior During Therapy: WFL for tasks assessed/performed Overall Cognitive Status: No family/caregiver present to determine baseline cognitive functioning    Extremity/Trunk Assessment Right Upper Extremity Assessment RUE ROM/Strength/Tone: WFL for tasks assessed RUE Sensation: WFL - Light Touch RUE Coordination: WFL - gross/fine motor Left Upper Extremity Assessment LUE ROM/Strength/Tone: WFL for tasks assessed LUE Sensation: WFL - Light Touch LUE Coordination: WFL - gross/fine motor Right Lower Extremity Assessment RLE ROM/Strength/Tone: WFL for tasks assessed Left Lower Extremity Assessment LLE ROM/Strength/Tone: WFL for tasks assessed Trunk Assessment Trunk Assessment: Normal   Balance Balance Balance Assessed: No Dynamic Standing Balance Dynamic Standing - Balance Support: No upper extremity supported Dynamic Standing - Level of Assistance: 6: Modified independent (Device/Increase time)  End of Session PT - End of Session Equipment Utilized During Treatment: Gait belt Activity Tolerance: Patient tolerated treatment well Patient left: in bed;with call bell/phone within reach Nurse Communication: Mobility status  GP     [name] 03/27/2013, 1:08 PM  [name] L. Nilsa Macht DPT [phone]

## 2013-03-27 NOTE — Progress Notes (Signed)
ANTICOAGULATION CONSULT NOTE - Follow Up Consult  Pharmacy Consult for Heparin & Levaquin Indication: Hx PE & possible PNA  Allergies  Allergen Reactions  . Amoxicillin Anaphylaxis    Throat closes and gets sweaty.  . Iohexol      Desc: scratchy/itchy throat/itching on back / nausea    Patient Measurements: Height: 5\' 2"  (157.5 cm) Weight: 276 lb 3.8 oz (125.3 kg) IBW/kg (Calculated) : 50.1 Heparin Dosing Weight: 81.4 kg  Vital Signs: Temp: 98.2 F (36.8 C) (04/18 1010) Temp src: Oral (04/18 1010) BP: 85/41 mmHg (04/18 0634) Pulse Rate: 85 (04/18 1222)   Recent Labs  03/26/13 1420 03/26/13 1445 03/27/13 1032 03/27/13 1125  HGB 11.9*  --  10.7*  --   HCT 36.3  --  34.5*  --   PLT 171  --  168  --   LABPROT  --  14.1  --   --   INR  --  1.10  --   --   HEPARINUNFRC  --   --   --  <0.10*  CREATININE 4.24*  --  6.10*  --     Estimated Creatinine Clearance: 13.7 ml/min (by C-G formula based on Cr of 6.1).   Assessment: 77 yoF with ESRD-HD-TTSa continuing on IV heparin for history of PE and Vanc/Primaxin/Levquin for possible PNA.  Patient reports taking Lovenox injections at home (last dose reported 4/16) however both her pharmacy and insurance company do not appear to have recent records any Lovenox claims, so not sure about accuracy of patient report.    Heparin level is <0.1 at rate of 1100 units/hr. No bleeding issues reported and RN denies any issues with heparin infusion line.   Goal of Therapy:  Heparin level 0.3-0.7 units/ml Monitor platelets by anticoagulation protocol: Yes   Plan:  1) Heparin bolus IV 2400 units x 1, then increase to 1350 units/hr 2) Check 8-hr heparin level, then daily HL and CBC while on heparin 3) Change Levaquin to 500mg  Q48 hours 4) F/u plans for HD, LOT, clinical status    Woodroe Chen, PharmD, BCPS 03/27/2013   12:54 PM

## 2013-03-27 NOTE — Progress Notes (Addendum)
[facility] PROGRESS NOTE  [name] [id number] DOB: [birth year] DOA: 03/26/2013 PCP: Donetta Potts, MD  Assessment/Plan: Fever and chills/Cough:  - Her blood pressure has been stable in ED she she takes Midodrine for hypotension, she was given one dose of vancomycin at the dialysis center and blood cultures were ordered.  - I will start treatment for health care associated-acquired pneumonia with vancomycin and cefepimeHer chest x-ray does not show an infiltrate, she does not have elevated white count on her CBC. She continues to spike fevers and the emergency room. I will place her on Tylenol for this.  - Sputum cultures. Blood cultures   ESRD on hemodialysis:  - consult renal for further dialysis.   Diarrhea  - She denies any recent antibiotic use.   C. Difficile   OSA (obstructive sleep apnea)  - she denies CPAP at home.   Anemia of chronic disease  - stable management per renal.   Secondary hyperparathyroidism  - Stable management per renal.   Insulin-requiring or dependent type II diabetes mellitus:  - I will continue her current dose of Lantus and start her on sliding scale insulin in house.   H/o PE- called PCP's office who states she takes 150 mg lovenox daily as she still had clots on coumadin     Code Status: full Family Communication: patient at bedside Disposition Plan:    Consultants:  renal  Procedures:  HD  Antibiotics:    HPI/Subjective: No fevers No more diarrhea  Objective: Filed Vitals:   03/27/13 0634 03/27/13 1010 03/27/13 1034 03/27/13 1222  BP: 85/41     Pulse: 89 85 82 85  Temp: 98.6 F (37 C) 98.2 F (36.8 C)    TempSrc: Oral Oral    Resp: 20 19    Height:      Weight:      SpO2: 100% 95% 97% 94%    Intake/Output Summary (Last 24 hours) at 03/27/13 1352 Last data filed at 03/27/13 1250  Gross per 24 hour  Intake 1277.52 ml  Output      0 ml  Net 1277.52 ml   Filed Weights   03/27/13 0100   Weight: 125.3 kg (276 lb 3.8 oz)    Exam:   General:  A+Ox3, NAD  Cardiovascular: rrr  Respiratory: clear anterior  Abdomen: +Bs, soft, NT  Musculoskeletal: moves all 4 ext   Data Reviewed: Basic Metabolic Panel:  Recent Labs Lab 03/26/13 1420 03/27/13 1032  NA 137 136  K 3.3* 4.0  CL 97 97  CO2 30 29  GLUCOSE 108* 73  BUN 12 21  CREATININE 4.24* 6.10*  CALCIUM 9.6 9.0   Liver Function Tests:  Recent Labs Lab 03/27/13 1032  AST 15  ALT 13  ALKPHOS 129*  BILITOT 0.4  PROT 7.8  ALBUMIN 3.3*   No results found for this basename: LIPASE, AMYLASE,  in the last 168 hours No results found for this basename: AMMONIA,  in the last 168 hours CBC:  Recent Labs Lab 03/26/13 1420 03/27/13 1032  WBC 6.9 5.7  NEUTROABS 5.8  --   HGB 11.9* 10.7*  HCT 36.3 34.5*  MCV 90.3 92.5  PLT 171 168   Cardiac Enzymes: No results found for this basename: CKTOTAL, CKMB, CKMBINDEX, TROPONINI,  in the last 168 hours BNP (last 3 results) No results found for this basename: PROBNP,  in the last 8760 hours CBG:  Recent Labs Lab 03/26/13 2159  GLUCAP 117*    Recent Results (  from the past 240 hour(s))  MRSA PCR SCREENING     Status: None   Collection Time    03/26/13 10:53 PM      Result Value Range Status   MRSA by PCR NEGATIVE  NEGATIVE Final   Comment:            The GeneXpert MRSA Assay (FDA     approved for NASAL specimens     only), is one component of a     comprehensive MRSA colonization     surveillance program. It is not     intended to diagnose MRSA     infection nor to guide or     monitor treatment for     MRSA infections.     Studies: Dg Chest 2 View  03/26/2013  *RADIOLOGY REPORT*  Clinical Data: Cough, fever, shortness of breath and chest discomfort.  History of hypertension and diabetes.  CHEST - 2 VIEW  Comparison: 10/13/2010.  Findings: 1333 hours.  There is mild patient rotation to the left. Heart size and mediastinal contours are stable.   The lungs are now clear.  There is no pleural effusion or pneumothorax.  Osteophytes are noted throughout the thoracic spine associated with a mild convex right scoliosis.  IMPRESSION: Interval improved aeration of the lungs.  No acute cardiopulmonary process.   Original Report Authenticated By: Richardean Sale, M.D.     Scheduled Meds: . calcium acetate  1,334 mg Oral TID WC  . [START ON 03/28/2013] doxercalciferol  1 mcg Intravenous Q T,Th,Sa-HD  . heparin  2,400 Units Intravenous Once  . imipenem-cilastatin  250 mg Intravenous Q12H  . insulin glargine  35 Units Subcutaneous QHS  . [START ON 03/28/2013] levofloxacin (LEVAQUIN) IV  500 mg Intravenous Q48H  . metoCLOPramide  5 mg Oral QHS  . midodrine  5 mg Oral BID  . sodium chloride  3 mL Intravenous Q12H  . sodium chloride  3 mL Intravenous Q12H   Continuous Infusions: . sodium chloride 50 mL/hr at 03/26/13 2240  . heparin 1,100 Units/hr (03/26/13 2339)    Principal Problem:   Fever and chills Active Problems:   Cough   Diarrhea   ESRD on hemodialysis   OSA (obstructive sleep apnea)   Anemia of chronic disease   Secondary hyperparathyroidism   Insulin-requiring or dependent type II diabetes mellitus    Time spent: [place], [place] [facility] Pager [phone]. If 7PM-7AM, please contact night-coverage at [email or web address], password [email or web address] 03/27/2013, 1:52 PM  LOS: 1 day

## 2013-03-27 NOTE — Consult Note (Signed)
[facility] Renal Consultation Note    Indication for Consultation:  Management of ESRD/hemodialysis; anemia, hypertension/volume and secondary hyperparathyroidism  HPI: [name] is a 52 y.o. morbidly obese female with ESRD secondary to HTN on HD since 1994. She dialyzes TTS HD at [facility] and  presented yesterday feeling badly pre HD. She woke up nauseated and not feeling right. She had diarrhea and had a temp of 100.5 pre HD. BC x 2 were drawn and she was treated empirically with 2 gm of Vancomycin. Post HD her temp was 98.9. Her husband brought her to the ED for further evaluation. She's had a cough for several weeks since the pollen got bad. She denies SOB, CP. She last had diarrhea yesterday in the ED. Her grandchildren that visit are often ill. She uses CPAP at night. Appetite is only fair.  Past Medical History  Diagnosis Date  . HTN (hypertension)   . Obesities, morbid   . Diabetes mellitus type 2 in obese   . GERD (gastroesophageal reflux disease)   . PE (pulmonary embolism) ~ 2010  . Superior vena cava syndrome     Archie Endo 03/26/2013  . ESRD (end stage renal disease) on dialysis     "TTS; [facility]; (03/26/2013)  . Pneumonia     "now & once a long time ago" (03/26/2013)  . Shortness of breath     "just a little bit; at any time" (03/26/2013)  . OSA on CPAP   . Iron deficiency anemia    Past Surgical History  Procedure Laterality Date  . Vascular surgery    . Thrombectomy and revision of arterioventous (av) goretex  graft Left ~ 12/2012    "thigh" (03/26/2013)  . Arteriovenous graft placement Left ~ 2011    "thigh" (03/26/2013)  . Carpal tunnel release Right ~ 2011   Family History  Problem Relation Age of Onset  . Diabetes Mellitus II Mother   . Hypertension Mother   . Hypertension Father   . Diabetes Mellitus II Father    Social History:  reports that she has never smoked. She has never used smokeless tobacco. She reports that she does not drink alcohol  or use illicit drugs. Allergies  Allergen Reactions  . Amoxicillin Anaphylaxis    Throat closes and gets sweaty.  . Iohexol      Desc: scratchy/itchy throat/itching on back / nausea    Prior to Admission medications   Medication Sig Start Date End Date Taking? Authorizing Provider  albuterol (PROVENTIL HFA;VENTOLIN HFA) 108 (90 BASE) MCG/ACT inhaler Inhale 2 puffs into the lungs every 6 (six) hours as needed. For shortness of breath.    Yes Historical Provider, MD  calcium acetate (PHOSLO) 667 MG capsule Take 1,334 mg by mouth 3 (three) times daily with meals.    Yes Historical Provider, MD  enoxaparin (LOVENOX) 100 MG/ML injection Inject 100 mg into the skin every 12 (twelve) hours. Last dose 02/01/13   Yes Historical Provider, MD  insulin aspart (NOVOLOG FLEXPEN) 100 UNIT/ML injection Inject 12 Units into the skin 3 (three) times daily before meals.   Yes Historical Provider, MD  insulin glargine (LANTUS) 100 UNIT/ML injection Inject 35 Units into the skin at bedtime.     Yes Historical Provider, MD  metoCLOPramide (REGLAN) 5 MG tablet Take 5 mg by mouth at bedtime.     Yes Historical Provider, MD  midodrine (PROAMATINE) 5 MG tablet Take 5 mg by mouth 2 (two) times daily.     Yes  Historical Provider, MD  Multiple Vitamins-Minerals (MULTIVITAMINS THER. W/MINERALS) TABS Take 1 tablet by mouth daily.     Yes Historical Provider, MD  warfarin (COUMADIN) 5 MG tablet Take 5 mg by mouth daily. On hold for procedure   Yes Historical Provider, MD   Current Facility-Administered Medications  Medication Dose Route Frequency Provider Last Rate Last Dose  . 0.9 %  sodium chloride infusion   Intravenous Continuous Virgel Manifold, MD 50 mL/hr at 03/26/13 2240    . 0.9 %  sodium chloride infusion  250 mL Intravenous PRN Charlynne Cousins, MD      . acetaminophen (TYLENOL) tablet 650 mg  650 mg Oral Q6H PRN Charlynne Cousins, MD       Or  . acetaminophen (TYLENOL) suppository 650 mg  650 mg Rectal Q6H  PRN Charlynne Cousins, MD      . albuterol (PROVENTIL HFA;VENTOLIN HFA) 108 (90 BASE) MCG/ACT inhaler 2 puff  2 puff Inhalation Q6H PRN Charlynne Cousins, MD      . calcium acetate (PHOSLO) capsule 1,334 mg  1,334 mg Oral TID WC Charlynne Cousins, MD   1,334 mg at 03/27/13 1230  . heparin ADULT infusion 100 units/mL (25000 units/250 mL)  1,100 Units/hr Intravenous Continuous Dareen Piano, [facility] 11 mL/hr at 03/26/13 2339 1,100 Units/hr at 03/26/13 2339  . HYDROcodone-acetaminophen (NORCO/VICODIN) 5-325 MG per tablet 1-2 tablet  1-2 tablet Oral Q4H PRN Charlynne Cousins, MD      . imipenem-cilastatin (PRIMAXIN) 250 mg in sodium chloride 0.9 % 100 mL IVPB  250 mg Intravenous Q12H Dareen Piano, RPH   250 mg at 03/27/13 0802  . insulin glargine (LANTUS) injection 35 Units  35 Units Subcutaneous QHS Charlynne Cousins, MD   35 Units at 03/26/13 2242  . [START ON 03/28/2013] levofloxacin (LEVAQUIN) IVPB 500 mg  500 mg Intravenous Q48H [employer], RPH      . loperamide (IMODIUM) capsule 2 mg  2 mg Oral Q4H PRN Mylinda Latina III, MD   2 mg at 03/26/13 1915  . metoCLOPramide (REGLAN) tablet 5 mg  5 mg Oral QHS Charlynne Cousins, MD   5 mg at 03/26/13 2240  . midodrine (PROAMATINE) tablet 5 mg  5 mg Oral BID Charlynne Cousins, MD   5 mg at 03/27/13 1006  . ondansetron (ZOFRAN) tablet 4 mg  4 mg Oral Q6H PRN Charlynne Cousins, MD      . sodium chloride 0.9 % injection 3 mL  3 mL Intravenous Q12H Charlynne Cousins, MD      . sodium chloride 0.9 % injection 3 mL  3 mL Intravenous Q12H Charlynne Cousins, MD      . sodium chloride 0.9 % injection 3 mL  3 mL Intravenous PRN Charlynne Cousins, MD       Labs: Basic Metabolic Panel:  Recent Labs Lab 03/26/13 1420 03/27/13 1032  NA 137 136  K 3.3* 4.0  CL 97 97  CO2 30 29  GLUCOSE 108* 73  BUN 12 21  CREATININE 4.24* 6.10*  CALCIUM 9.6 9.0   Liver Function Tests:  Recent Labs Lab 03/27/13 1032  AST 15  ALT 13   ALKPHOS 129*  BILITOT 0.4  PROT 7.8  ALBUMIN 3.3*  CBC:  Recent Labs Lab 03/26/13 1420 03/27/13 1032  WBC 6.9 5.7  NEUTROABS 5.8  --   HGB 11.9* 10.7*  HCT 36.3 34.5*  MCV 90.3 92.5  PLT 171 168  CBG:  Recent Labs Lab 03/26/13 2159  GLUCAP 117*   Lab Results  Component Value Date   INR 1.10 03/26/2013  Studies/Results: Dg Chest 2 View  03/26/2013  *RADIOLOGY REPORT*  Clinical Data: Cough, fever, shortness of breath and chest discomfort.  History of hypertension and diabetes.  CHEST - 2 VIEW  Comparison: 10/13/2010.  Findings: 1333 hours.  There is mild patient rotation to the left. Heart size and mediastinal contours are stable.  The lungs are now clear.  There is no pleural effusion or pneumothorax.  Osteophytes are noted throughout the thoracic spine associated with a mild convex right scoliosis.  IMPRESSION: Interval improved aeration of the lungs.  No acute cardiopulmonary process.   Original Report Authenticated By: Richardean Sale, M.D.    ROS: negative except as per HPI  Physical Exam: Filed Vitals:   03/27/13 0634 03/27/13 1010 03/27/13 1034 03/27/13 1222  BP: 85/41     Pulse: 89 85 82 85  Temp: 98.6 F (37 C) 98.2 F (36.8 C)    TempSrc: Oral Oral    Resp: 20 19    Height:      Weight:      SpO2: 100% 95% 97% 94%     General: Well developed, well nourished obese, in no acute distress wearing O2 (doesn't use O2 at home) Head: Normocephalic, atraumatic, sclera non-icteric, mucus membranes are moist Neck: Supple. JVD not elevated. Lungs: Diminished BS, few soft exp wheezes Heart: RRR distant Abdomen: obese, soft, non-tender, non-distended with normoactive bowel sounds.  Extremities: lower without edema or ischemic changes, no open wounds; upper - multiple failed access, obese  Neuro: Alert and oriented X 3. Moves all extremities spontaneously. Psych:  Responds to questions appropriately with a normal affect. Dialysis Access: right thigh graft +  bruit  Dialysis Orders: Center: [facility]  on TTS Optiflux 180. EDW 127 kg HD Bath 2K 2.5 CaTime 4.75 hr Heparin 3K with 1K mid tmt. Access left thigh AVGG - REVERSED 400/800     hectorol 2 mcg IV/HD 9 for Ca support) Epogen-none. Recent labs:  Hgb 11.6 03/19/13 29% sat 3/14, ferritin 1250 1/14. iPTH 13 - s/p parathyroidectomy  Assessment/Plan: 1. Febrile illness/cough - workup in progress; CXR neg; on Vanc, levaquin and primaxin, cultures pending; no obvious infection source old and current HD access sites not grossly infected, +cough, on Rx for HCAP per primary 2. ESRD -  TTS - usual HD Saturday; (our records state her thigh graft is reversed-it is not per tech): the pt tells me the staff has been usually only  cannulating the arterial side of graft since stent placed in venous limb 2/25 during declot due to difficulty cannulating venous limb (could in part be due to obesity- I confirmed with tech that some staff only cannulate arterial side with one needle up and the venous down but kinetics are still ok) 3. Chronic hypotension/volume  - on midodrine prone to low BPs on HD; the closest she has got to EDW recently was 127.5; BP may drop as low as 70-80 during treatments but pre BPs can vary from 90 to 130 sitting and 90 to 138 standing pre HD; average UFs about 3 kg +/-Body habitus and multiple failed acceses makes it difficult to get accurate BP readings 4. Anemia  - off epo at present; follow; If Hgb < 11 on Saturday, initiate ESA 5. Metabolic bone disease -  Continue hectorol for Ca support post parathyroidectomy; phoslo and 2.5 bath - resume at lower dose  of 1 due to suppresed iPTH 6. Nutrition - high protein renal diet  7. DM - per primary; A!C ok. 8. Hx PE - on chronic coumadin/IV heparin- I don't think she has been taking(Dr. Jonelle Sidle is her primary; her dialysis unit does not follow INRs)  Myriam Jacobson, PA-C [place] 03/27/2013, 12:38 PM     Patient seen  and examined.  I agree with plan as above with additions as indicated. Kelly Splinter  MD [phone] pgr    [phone] cell 03/27/2013, 3:27 PM

## 2013-03-27 NOTE — Progress Notes (Signed)
Pt's B/P is 88/39. Pt is asymptomatic. MD made aware. Will continue to monitor. Cornell Barman

## 2013-03-27 NOTE — Evaluation (Signed)
Occupational Therapy Evaluation Patient Details Name: MELLODY FLORENTINE MRN: [id number] DOB: 1961/11/03 Today's Date: 03/27/2013 Time: [id number] OT Time Calculation (min): 22 min  OT Assessment / Plan / Recommendation Clinical Impression  Pt is a pleasant 52 yr old female with end stage renal disease.  Now admitted with increased fevers and questionable PNA.  Overall presents at a modified independent level to supervision level for simulated and performed selfcare tasks.  No further OT issues.    OT Assessment  Patient does not need any further OT services    Follow Up Recommendations  No OT follow up       Equipment Recommendations  None recommended by OT          Precautions / Restrictions Precautions Precautions: None Restrictions Weight Bearing Restrictions: No   Pertinent Vitals/Pain O2 sats 94% or greater on room air    ADL  Eating/Feeding: Simulated;Independent Where Assessed - Eating/Feeding: Edge of bed Grooming: Simulated;Independent Where Assessed - Grooming: Unsupported standing Upper Body Bathing: Simulated;Set up Where Assessed - Upper Body Bathing: Unsupported sitting Lower Body Bathing: Simulated;Supervision/safety Where Assessed - Lower Body Bathing: Unsupported sit to stand Upper Body Dressing: Simulated;Set up Where Assessed - Upper Body Dressing: Unsupported sitting Lower Body Dressing: Simulated Where Assessed - Lower Body Dressing: Supported sit to stand Toilet Transfer: Simulated;Modified independent Toilet Transfer Method: Stand pivot Science writer: Regular height toilet Toileting - Clothing Manipulation and Hygiene: Simulated;Independent Where Assessed - Toileting Clothing Manipulation and Hygiene: Sit to stand from 3-in-1 or toilet Tub/Shower Transfer: Simulated;Modified independent Tub/Shower Transfer Method: Stand pivot Transfers/Ambulation Related to ADLs: Pt independent for mobility in the room during toilet transfer and grooming  tasks.  Pt with wide BOS and stepping pattern secondary to size.      OT Goals Acute Rehab OT Goals OT Goal Formulation: With patient  Visit Information  Last OT Received On: 03/27/13 Assistance Needed: +1    Subjective Data  Subjective: I haven't had any falls at home. Patient Stated Goal: Did not state   Prior Functioning     Home Living Lives With: Significant other;Other (Comment) (Sister) Available Help at Discharge: Family;Available 24 hours/day Type of Home: House Home Access: Ramped entrance Home Layout: One level Bathroom Shower/Tub: Chiropodist: Standard Home Adaptive Equipment: None Prior Function Level of Independence: Independent Driving: No Vocation: On disability Communication Communication: No difficulties Dominant Hand: Right         Vision/Perception Vision - History Baseline Vision: No visual deficits Patient Visual Report: No change from baseline Vision - Assessment Eye Alignment: Within Functional Limits Vision Assessment: Vision not tested Perception Perception: Within Functional Limits Praxis Praxis: Intact   Cognition  Cognition Arousal/Alertness: Awake/alert Behavior During Therapy: WFL for tasks assessed/performed Overall Cognitive Status: No family/caregiver present to determine baseline cognitive functioning    Extremity/Trunk Assessment Right Upper Extremity Assessment RUE ROM/Strength/Tone: WFL for tasks assessed RUE Sensation: WFL - Light Touch RUE Coordination: WFL - gross/fine motor Left Upper Extremity Assessment LUE ROM/Strength/Tone: WFL for tasks assessed LUE Sensation: WFL - Light Touch LUE Coordination: WFL - gross/fine motor Trunk Assessment Trunk Assessment: Normal     Mobility Bed Mobility Bed Mobility: Supine to Sit Supine to Sit: 6: Modified independent (Device/Increase time);HOB flat Transfers Transfers: Sit to Stand;Stand to Sit Sit to Stand: 6: Modified independent  (Device/Increase time);With upper extremity assist;From toilet Stand to Sit: 6: Modified independent (Device/Increase time);With upper extremity assist;To toilet        Balance Balance Balance Assessed: Yes  Dynamic Standing Balance Dynamic Standing - Balance Support: No upper extremity supported Dynamic Standing - Level of Assistance: 6: Modified independent (Device/Increase time)   End of Session OT - End of Session Activity Tolerance: Patient tolerated treatment well Patient left: in bed;with family/visitor present Nurse Communication: Mobility status     [place] OTR/L Pager number [phone] 03/27/2013, 12:36 PM

## 2013-03-28 DIAGNOSIS — D638 Anemia in other chronic diseases classified elsewhere: Secondary | ICD-10-CM

## 2013-03-28 DIAGNOSIS — G4733 Obstructive sleep apnea (adult) (pediatric): Secondary | ICD-10-CM

## 2013-03-28 LAB — CBC
Hemoglobin: 11 g/dL — ABNORMAL LOW (ref 12.0–15.0)
MCH: 29 pg (ref 26.0–34.0)
MCV: 93.1 fL (ref 78.0–100.0)
RBC: 3.79 MIL/uL — ABNORMAL LOW (ref 3.87–5.11)

## 2013-03-28 LAB — RENAL FUNCTION PANEL
Albumin: 3.8 g/dL (ref 3.5–5.2)
BUN: 30 mg/dL — ABNORMAL HIGH (ref 6–23)
CO2: 26 mEq/L (ref 19–32)
Chloride: 95 mEq/L — ABNORMAL LOW (ref 96–112)
Creatinine, Ser: 7.73 mg/dL — ABNORMAL HIGH (ref 0.50–1.10)
Glucose, Bld: 113 mg/dL — ABNORMAL HIGH (ref 70–99)
Potassium: 4.3 mEq/L (ref 3.5–5.1)

## 2013-03-28 LAB — GLUCOSE, CAPILLARY: Glucose-Capillary: 84 mg/dL (ref 70–99)

## 2013-03-28 MED ORDER — HEPARIN SODIUM (PORCINE) 1000 UNIT/ML DIALYSIS: 1000.0000 [IU] | INTRAMUSCULAR | Status: DC | PRN

## 2013-03-28 MED ORDER — DOXERCALCIFEROL 4 MCG/2ML IV SOLN
INTRAVENOUS | Status: AC
  Administered 2013-03-28: 1 ug via INTRAVENOUS
  Filled 2013-03-28: qty 2

## 2013-03-28 MED ORDER — LIDOCAINE HCL (PF) 1 % IJ SOLN: 5.0000 mL | INTRAMUSCULAR | Status: DC | PRN

## 2013-03-28 MED ORDER — NEPRO/CARBSTEADY PO LIQD: 237.0000 mL | ORAL | Status: DC | PRN

## 2013-03-28 MED ORDER — HEPARIN SODIUM (PORCINE) 1000 UNIT/ML DIALYSIS
20.0000 [IU]/kg | INTRAMUSCULAR | Status: DC | PRN
  Administered 2013-03-28: 2500 [IU] via INTRAVENOUS_CENTRAL

## 2013-03-28 MED ORDER — LIDOCAINE-PRILOCAINE 2.5-2.5 % EX CREA: 1.0000 "application " | TOPICAL_CREAM | CUTANEOUS | Status: DC | PRN

## 2013-03-28 MED ORDER — PENTAFLUOROPROP-TETRAFLUOROETH EX AERO: 1.0000 "application " | INHALATION_SPRAY | CUTANEOUS | Status: DC | PRN

## 2013-03-28 MED ORDER — ONDANSETRON HCL 4 MG/2ML IJ SOLN
INTRAMUSCULAR | Status: AC
  Administered 2013-03-28: 4 mg via INTRAVENOUS
  Filled 2013-03-28: qty 2

## 2013-03-28 MED ORDER — MIDODRINE HCL 5 MG PO TABS
ORAL_TABLET | ORAL | Status: AC
  Administered 2013-03-28: 5 mg via ORAL
  Filled 2013-03-28: qty 1

## 2013-03-28 MED ORDER — ALTEPLASE 2 MG IJ SOLR: 2.0000 mg | Freq: Once | INTRAMUSCULAR | Status: DC | PRN

## 2013-03-28 MED ORDER — ONDANSETRON HCL 4 MG/2ML IJ SOLN: 4.0000 mg | Freq: Four times a day (QID) | INTRAMUSCULAR | Status: DC | PRN

## 2013-03-28 MED ORDER — SODIUM CHLORIDE 0.9 % IV SOLN: 100.0000 mL | INTRAVENOUS | Status: DC | PRN

## 2013-03-28 NOTE — Progress Notes (Signed)
03/28/13 1906 nsg Dr. Eliseo Squires notified of low blood pressure 68/37 manual 62/38. Pt is asymptomatic ; went to hd today off 1.2 L; orders noted; continue to monitor

## 2013-03-28 NOTE — Progress Notes (Signed)
Subjective: no complaints, outpatient blood cx's are negative so far  Objective Vital signs in last 24 hours: Filed Vitals:   03/28/13 0959 03/28/13 1008 03/28/13 1028 03/28/13 1100  BP: 81/42 109/42 98/49 101/49  Pulse: 86  88 85  Temp:      TempSrc:      Resp:      Height:      Weight:      SpO2: 94%      Weight change: 0.001 kg (0 oz)  Intake/Output Summary (Last 24 hours) at 03/28/13 1111 Last data filed at 03/28/13 0446  Gross per 24 hour  Intake    560 ml  Output      0 ml  Net    560 ml   Labs: Basic Metabolic Panel:  Recent Labs Lab 03/26/13 1420 03/27/13 1032 03/28/13 0730  NA 137 136 135  K 3.3* 4.0 4.3  CL 97 97 95*  CO2 30 29 26   GLUCOSE 108* 73 113*  BUN 12 21 30*  CREATININE 4.24* 6.10* 7.73*  CALCIUM 9.6 9.0 9.2  PHOS  --   --  5.8*   Liver Function Tests:  Recent Labs Lab 03/27/13 1032 03/28/13 0730  AST 15  --   ALT 13  --   ALKPHOS 129*  --   BILITOT 0.4  --   PROT 7.8  --   ALBUMIN 3.3* 3.8   No results found for this basename: LIPASE, AMYLASE,  in the last 168 hours No results found for this basename: AMMONIA,  in the last 168 hours CBC:  Recent Labs Lab 03/26/13 1420 03/27/13 1032 03/28/13 0730  WBC 6.9 5.7 7.9  NEUTROABS 5.8  --   --   HGB 11.9* 10.7* 11.0*  HCT 36.3 34.5* 35.3*  MCV 90.3 92.5 93.1  PLT 171 168 167   PT/INR: @LABRCNTIP (inr:5)   Scheduled Meds ) . calcium acetate  1,334 mg Oral TID WC  . doxercalciferol  1 mcg Intravenous Q T,Th,Sa-HD  . imipenem-cilastatin  250 mg Intravenous Q12H  . insulin glargine  35 Units Subcutaneous QHS  . levofloxacin (LEVAQUIN) IV  500 mg Intravenous Q48H  . metoCLOPramide  5 mg Oral QHS  . midodrine  5 mg Oral BID  . sodium chloride  3 mL Intravenous Q12H  . sodium chloride  3 mL Intravenous Q12H  . vancomycin  1,000 mg Intravenous Q T,Th,Sa-HD    Physical Exam:  Blood pressure 101/49, pulse 85, temperature 98.3 F (36.8 C), temperature source Oral, resp. rate  20, height 5\' 2"  (1.575 m), weight 126.4 kg (278 lb 10.6 oz), SpO2 94.00%.  Gen: obese adult female, no distress, a bit lethargic Neck: no JVD Lungs: Diminished BS, few soft exp wheezes  Heart: RRR distant  Abdomen: obese, soft, non-tender, non-distended with normoactive bowel sounds.  Ext: upper arm old accesses and thigh AVG without erythema or edema Neuro: Alert and oriented X 3. Moves all extremities spontaneously Dialysis Access: right thigh graft + bruit   Dialysis Orders: Center: [facility] on TTS Optiflux 180.  EDW 127 kg HD Bath 2K 2.5 CaTime 4.75 hr Heparin 3K with 1K mid tmt. Access left thigh AVGG - REVERSED 400/800  hectorol 2 mcg IV/HD 9 for Ca support) Epogen-none. Recent labs: Hgb 11.6 03/19/13 29% sat 3/14, ferritin 1250 1/14. iPTH 13 - s/p parathyroidectomy   Assessment/Plan:  1. Febrile illness/cough - workup in progress; CXR neg; on Vanc, levaquin and primaxin, called HD unit, outpt blood cultures from 4/17 are  negative to date; no obvious infection source old and current HD access sites not grossly infected, +cough, on Rx for HCAP per primary 2. ESRD - cont TTS HD(our records state her thigh graft is reversed-it is not per tech): the pt tells me the staff has been usually only cannulating the arterial side of graft since stent placed in venous limb 2/25 during declot due to difficulty cannulating venous limb (could in part be due to obesity- I confirmed with tech that some staff only cannulate arterial side with one needle up and the venous down but kinetics are still ok) 3. Chronic hypotension/volume - on midodrine prone to low BPs on HD; the closest she has got to EDW recently was 127.5; BP may drop as low as 70-80 during treatments but pre BPs can vary from 90 to 130 sitting and 90 to 138 standing pre HD; average UFs about 3 kg +/-Body habitus and multiple failed acceses makes it difficult to get accurate BP readings no volume excess on exam, at dry weight 4. Anemia - off epo at  present; follow; If Hgb < 11 on Saturday, initiate ESA 5. Metabolic bone disease - Continue hectorol for Ca support post parathyroidectomy; phoslo and 2.5 bath - resume at lower dose of 1 due to suppresed iPTH 6. Nutrition - high protein renal diet  7. DM - per primary; A1C ok. 8. 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 Jonelle Sidle is her primary; her dialysis unit does not follow INRs)   Kelly Splinter  MD [phone] pgr    [phone] cell 03/28/2013, 11:11 AM

## 2013-03-28 NOTE — Progress Notes (Signed)
[facility] PROGRESS NOTE  [name] [id number] DOB: September 08, 1961 DOA: 03/26/2013 PCP: Donetta Potts, MD  Assessment/Plan: Fever and chills/Cough:  - Her blood pressure has been stable in ED she she takes Midodrine for hypotension, she was given one dose of vancomycin at the dialysis center and blood cultures were ordered.  - I will start treatment for health care associated-acquired pneumonia with vancomycin and cefepimeHer chest x-ray does not show an infiltrate, she does not have elevated white count on her CBC.  -no fever - Sputum cultures. Blood cultures   ESRD on hemodialysis:  - consult renal for further dialysis.   Diarrhea  - She denies any recent antibiotic use.   C. Difficile not done  OSA (obstructive sleep apnea)  - she denies CPAP at home.   Anemia of chronic disease  - stable management per renal.   Secondary hyperparathyroidism  - Stable management per renal.   Insulin-requiring or dependent type II diabetes mellitus:  - I will continue her current dose of Lantus and start her on sliding scale insulin in house.   H/o PE- called PCP's office who states she takes 150 mg lovenox daily as she still had clots on coumadin but this prescription was given in May 2013 and patient did not fill and never went back to the PCP to follow up.  Will not plan to d/c on anticoagulation last blood clot was 2006 and patient has not been on blood thinners and is not showing signs of any clots currently.  No pain in legs, no hypoxia     Code Status: full Family Communication: patient at bedside Disposition Plan:    Consultants:  renal  Procedures:  HD  Antibiotics:    HPI/Subjective: No fevers No more diarrhea In dialysis  Objective: Filed Vitals:   03/28/13 1028 03/28/13 1100 03/28/13 1141 03/28/13 1200  BP: 98/49 101/49 99/49 120/76  Pulse: 88 85 87 85  Temp:      TempSrc:      Resp:      Height:      Weight:      SpO2:   97%      Intake/Output Summary (Last 24 hours) at 03/28/13 1227 Last data filed at 03/28/13 0446  Gross per 24 hour  Intake    560 ml  Output      0 ml  Net    560 ml   Filed Weights   03/27/13 0100 03/27/13 2142 03/28/13 0639  Weight: 125.3 kg (276 lb 3.8 oz) 125.301 kg (276 lb 3.8 oz) 126.4 kg (278 lb 10.6 oz)    Exam:   General:  A+Ox3, NAD  Cardiovascular: rrr  Respiratory: clear anterior  Abdomen: +Bs, soft, NT  Musculoskeletal: moves all 4 ext   Data Reviewed: Basic Metabolic Panel:  Recent Labs Lab 03/26/13 1420 03/27/13 1032 03/28/13 0730  NA 137 136 135  K 3.3* 4.0 4.3  CL 97 97 95*  CO2 30 29 26   GLUCOSE 108* 73 113*  BUN 12 21 30*  CREATININE 4.24* 6.10* 7.73*  CALCIUM 9.6 9.0 9.2  PHOS  --   --  5.8*   Liver Function Tests:  Recent Labs Lab 03/27/13 1032 03/28/13 0730  AST 15  --   ALT 13  --   ALKPHOS 129*  --   BILITOT 0.4  --   PROT 7.8  --   ALBUMIN 3.3* 3.8   No results found for this basename: LIPASE, AMYLASE,  in the last 168  hours No results found for this basename: AMMONIA,  in the last 168 hours CBC:  Recent Labs Lab 03/26/13 1420 03/27/13 1032 03/28/13 0730  WBC 6.9 5.7 7.9  NEUTROABS 5.8  --   --   HGB 11.9* 10.7* 11.0*  HCT 36.3 34.5* 35.3*  MCV 90.3 92.5 93.1  PLT 171 168 167   Cardiac Enzymes: No results found for this basename: CKTOTAL, CKMB, CKMBINDEX, TROPONINI,  in the last 168 hours BNP (last 3 results) No results found for this basename: PROBNP,  in the last 8760 hours CBG:  Recent Labs Lab 03/26/13 2159 03/27/13 2252  GLUCAP 117* 104*    Recent Results (from the past 240 hour(s))  MRSA PCR SCREENING     Status: None   Collection Time    03/26/13 10:53 PM      Result Value Range Status   MRSA by PCR NEGATIVE  NEGATIVE Final   Comment:            The GeneXpert MRSA Assay (FDA     approved for NASAL specimens     only), is one component of a     comprehensive MRSA colonization     surveillance  program. It is not     intended to diagnose MRSA     infection nor to guide or     monitor treatment for     MRSA infections.     Studies: Dg Chest 2 View  03/26/2013  *RADIOLOGY REPORT*  Clinical Data: Cough, fever, shortness of breath and chest discomfort.  History of hypertension and diabetes.  CHEST - 2 VIEW  Comparison: 10/13/2010.  Findings: 1333 hours.  There is mild patient rotation to the left. Heart size and mediastinal contours are stable.  The lungs are now clear.  There is no pleural effusion or pneumothorax.  Osteophytes are noted throughout the thoracic spine associated with a mild convex right scoliosis.  IMPRESSION: Interval improved aeration of the lungs.  No acute cardiopulmonary process.   Original Report Authenticated By: Richardean Sale, M.D.     Scheduled Meds: . calcium acetate  1,334 mg Oral TID WC  . doxercalciferol  1 mcg Intravenous Q T,Th,Sa-HD  . imipenem-cilastatin  250 mg Intravenous Q12H  . insulin glargine  35 Units Subcutaneous QHS  . levofloxacin (LEVAQUIN) IV  500 mg Intravenous Q48H  . metoCLOPramide  5 mg Oral QHS  . midodrine  5 mg Oral BID  . sodium chloride  3 mL Intravenous Q12H  . sodium chloride  3 mL Intravenous Q12H  . vancomycin  1,000 mg Intravenous Q T,Th,Sa-HD   Continuous Infusions: . sodium chloride 50 mL/hr at 03/26/13 2240  . heparin 1,650 Units/hr (03/28/13 1045)    Principal Problem:   Fever and chills Active Problems:   Cough   Diarrhea   ESRD on hemodialysis   OSA (obstructive sleep apnea)   Anemia of chronic disease   Secondary hyperparathyroidism   Insulin-requiring or dependent type II diabetes mellitus    Time spent: [place], [place] [facility] Pager [phone]. If 7PM-7AM, please contact night-coverage at [email or web address], password [facility] 03/28/2013, 12:27 PM  LOS: 2 days

## 2013-03-28 NOTE — Procedures (Signed)
I was present at this dialysis session. I have reviewed the session itself and made appropriate changes.   Kelly Splinter, MD [employer] 03/28/2013, 10:05 AM

## 2013-03-28 NOTE — Progress Notes (Signed)
ANTICOAGULATION CONSULT NOTE - Follow Up Consult  Pharmacy Consult for heparin Indication: hx of PE  Allergies  Allergen Reactions  . Amoxicillin Anaphylaxis    Throat closes and gets sweaty.  . Iohexol      Desc: scratchy/itchy throat/itching on back / nausea     Patient Measurements: Height: 5\' 2"  (157.5 cm) Weight: 276 lb 3.8 oz (125.3 kg) IBW/kg (Calculated) : 50.1 Heparin Dosing Weight: 81.4 kg  Vital Signs: Temp: 98.7 F (37.1 C) (04/19 1319) Temp src: Oral (04/19 1319) BP: 94/61 mmHg (04/19 1319) Pulse Rate: 89 (04/19 1319)  Labs:  Recent Labs  03/26/13 1420 03/26/13 1445 03/27/13 1032 03/27/13 1125 03/27/13 2128 03/28/13 0730  HGB 11.9*  --  10.7*  --   --  11.0*  HCT 36.3  --  34.5*  --   --  35.3*  PLT 171  --  168  --   --  167  LABPROT  --  14.1  --   --   --   --   INR  --  1.10  --   --   --   --   HEPARINUNFRC  --   --   --  <0.10* <0.10* 0.29*  CREATININE 4.24*  --  6.10*  --   --  7.73*    Estimated Creatinine Clearance: 10.8 ml/min (by C-G formula based on Cr of 7.73).   Assessment: 13 yoF with ESRD-HD-TTSa continuing on IV heparin for history of PE.  Heparin level slightly SUB-therapeutic at 0.29 on 1650 units/hr. CBC is stable. No bleeding reported and no issues with infusion stopping or line per RN  Goal of Therapy:  Heparin level 0.3-0.7 units/ml Monitor platelets by anticoagulation protocol: Yes   Plan:  Increase heparin to 1750/hr 8hour level to confirm Daily HL, CBC Continue Primaxin 250 Q12 Continue Levaquin at 500 mg Q48 hours  Thank you for the consult.  Johny Drilling, PharmD Clinical Pharmacist Pager: [phone] Pharmacy: [phone] 03/28/2013 2:50 PM

## 2013-03-29 DIAGNOSIS — E162 Hypoglycemia, unspecified: Secondary | ICD-10-CM

## 2013-03-29 LAB — GLUCOSE, CAPILLARY
Glucose-Capillary: 68 mg/dL — ABNORMAL LOW (ref 70–99)
Glucose-Capillary: 104 mg/dL — ABNORMAL HIGH (ref 70–99)
Glucose-Capillary: 89 mg/dL (ref 70–99)

## 2013-03-29 LAB — CBC
HCT: 36.3 % (ref 36.0–46.0)
Hemoglobin: 11.2 g/dL — ABNORMAL LOW (ref 12.0–15.0)
MCV: 94.8 fL (ref 78.0–100.0)
RBC: 3.83 MIL/uL — ABNORMAL LOW (ref 3.87–5.11)
RDW: 14 % (ref 11.5–15.5)
WBC: 6.8 10*3/uL (ref 4.0–10.5)

## 2013-03-29 LAB — HEPARIN LEVEL (UNFRACTIONATED)
Heparin Unfractionated: 0.12 IU/mL — ABNORMAL LOW (ref 0.30–0.70)
Heparin Unfractionated: 0.24 IU/mL — ABNORMAL LOW (ref 0.30–0.70)

## 2013-03-29 LAB — BASIC METABOLIC PANEL
BUN: 15 mg/dL (ref 6–23)
CO2: 29 mEq/L (ref 19–32)
Chloride: 94 mEq/L — ABNORMAL LOW (ref 96–112)
Creatinine, Ser: 5.72 mg/dL — ABNORMAL HIGH (ref 0.50–1.10)
GFR calc Af Amer: 9 mL/min — ABNORMAL LOW (ref >90)
Potassium: 3.9 mEq/L (ref 3.5–5.1)

## 2013-03-29 MED ORDER — LORAZEPAM 0.5 MG PO TABS
0.5000 mg | ORAL_TABLET | Freq: Once | ORAL | Status: AC
  Administered 2013-03-29: 0.5 mg via ORAL
  Filled 2013-03-29: qty 1

## 2013-03-29 MED ORDER — DEXTROSE 50 % IV SOLN
INTRAVENOUS | Status: AC
  Administered 2013-03-29: 25 mL via INTRAVENOUS
  Filled 2013-03-29: qty 50

## 2013-03-29 MED ORDER — HEPARIN SODIUM (PORCINE) 5000 UNIT/ML IJ SOLN
5000.0000 [IU] | Freq: Three times a day (TID) | INTRAMUSCULAR | Status: DC
  Administered 2013-03-29 – 2013-03-31 (×6): 5000 [IU] via SUBCUTANEOUS
  Filled 2013-03-29 (×9): qty 1

## 2013-03-29 MED ORDER — INSULIN GLARGINE 100 UNIT/ML ~~LOC~~ SOLN
25.0000 [IU] | Freq: Every day | SUBCUTANEOUS | Status: DC
  Administered 2013-03-29 – 2013-03-30 (×2): 25 [IU] via SUBCUTANEOUS
  Filled 2013-03-29 (×4): qty 0.25

## 2013-03-29 MED ORDER — INSULIN ASPART 100 UNIT/ML ~~LOC~~ SOLN: 0.0000 [IU] | Freq: Three times a day (TID) | SUBCUTANEOUS | Status: DC

## 2013-03-29 MED ORDER — DEXTROSE 50 % IV SOLN: 25.0000 mL | Freq: Once | INTRAVENOUS | Status: DC

## 2013-03-29 MED ORDER — INSULIN GLARGINE 100 UNIT/ML ~~LOC~~ SOLN
30.0000 [IU] | Freq: Every day | SUBCUTANEOUS | Status: DC
  Filled 2013-03-29: qty 0.3

## 2013-03-29 MED ORDER — INSULIN ASPART 100 UNIT/ML ~~LOC~~ SOLN: 0.0000 [IU] | Freq: Every day | SUBCUTANEOUS | Status: DC

## 2013-03-29 NOTE — Progress Notes (Signed)
[place] for heparin Indication: hx of PE  Allergies  Allergen Reactions  . Amoxicillin Anaphylaxis    Throat closes and gets sweaty.  . Iohexol      Desc: scratchy/itchy throat/itching on back / nausea     Patient Measurements: Height: 5\' 2"  (157.5 cm) Weight: 294 lb 5 oz (133.5 kg) IBW/kg (Calculated) : 50.1 Heparin Dosing Weight: 81.4 kg  Vital Signs: Temp: 99.4 F (37.4 C) (04/19 2156) Temp src: Oral (04/19 2156) BP: 84/40 mmHg (04/19 2156) Pulse Rate: 84 (04/19 2156)  Labs:  Recent Labs  03/26/13 1420 03/26/13 1445 03/27/13 1032  03/27/13 2128 03/28/13 0730 03/29/13 0009  HGB 11.9*  --  10.7*  --   --  11.0*  --   HCT 36.3  --  34.5*  --   --  35.3*  --   PLT 171  --  168  --   --  167  --   LABPROT  --  14.1  --   --   --   --   --   INR  --  1.10  --   --   --   --   --   HEPARINUNFRC  --   --   --   < > <0.10* 0.29* 0.24*  CREATININE 4.24*  --  6.10*  --   --  7.73*  --   < > = values in this interval not displayed.  Estimated Creatinine Clearance: 11.2 ml/min (by C-G formula based on Cr of 7.73).   Assessment: 89 yoF with history of PE for heparin.  Noted plans to not continue anticoagulation as outpatient.  Can Heparin be changed to DVT prophylaxis dosing?  Goal of Therapy:  Heparin level 0.3-0.7 units/ml Monitor platelets by anticoagulation protocol: Yes   Plan:  Increase heparin 1850 units/hr F/U plan for anticoagulation.  Phillis Knack, PharmD, BCPS

## 2013-03-29 NOTE — Progress Notes (Signed)
03/29/13 0805. nsg Pt has been c/o jerky movements started yesterday but more pronounced today claims she's allergic to heparin further claims when she's in dialysis they give her Benadryl.  MD notified per pharmacy she's on heparin for 3 days now. Continue to monitor.

## 2013-03-29 NOTE — Progress Notes (Signed)
RN called to patient's bedside at beginning of shift.  Multiple family members at bedside and very concerned.  Patient is exhibiting bilateral shoulder jerking periodically.  They seem to be occurring more frequently.  CBG is 94.  B/P is 93/38, HR 72, and 96% 3LPM.  Patient feels more short of breath while on the CPAP and "feels hot".  Her O2 sats were 100% while on CPAP.  I switched her back to nasal cannula at 3 LPM.  Per day RN, there was concern that the jerking was occurring due to patient not utilizing her CPAP.  But family states she only uses her CPAP periodically at home and has never exhibited the jerking before.  Called [facility].  Will hold nighttime Reglan 5 mg and give 0.5 mg PO Ativan.  Will continue to monitor patient duringWill hold off on pain medication for now per patient's and family's request.  Linus Galas

## 2013-03-29 NOTE — Progress Notes (Signed)
ANTIBIOTIC CONSULT NOTE - FOLLOW UP  Pharmacy Consult for vancomycin and Primaxin Indication: rule out pneumonia  Allergies  Allergen Reactions  . Amoxicillin Anaphylaxis    Throat closes and gets sweaty.  . Iohexol      Desc: scratchy/itchy throat/itching on back / nausea     Patient Measurements: Height: 5\' 2"  (157.5 cm) Weight: 294 lb 5 oz (133.5 kg) IBW/kg (Calculated) : 50.1   Vital Signs: Temp: 98.2 F (36.8 C) (04/20 1030) Temp src: Oral (04/20 1030) BP: 77/34 mmHg (04/20 1030) Pulse Rate: 81 (04/20 1030) Intake/Output from previous day: 04/19 0701 - 04/20 0700 In: 668.4 [P.O.:120; I.V.:548.4] Out: 1293   Labs:  Recent Labs  03/27/13 1032 03/28/13 0730 03/29/13 0437  WBC 5.7 7.9 6.8  HGB 10.7* 11.0* 11.2*  PLT 168 167 189  CREATININE 6.10* 7.73* 5.72*   Estimated Creatinine Clearance: 15.2 ml/min (by C-G formula based on Cr of 5.72).     Assessment: 3 yoF with ESRD-HD-TTSa continuing on Vanc/Primaxin for possible HCAP. 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 WBC nml, Tm/24 100.0, now afebrile without the use of acetaminophen. Antibiotics are dosed appropriately for renal function.  ABX history: Primaxin 4/17> Vanc 4/17> Levaquin 4/17>> 4/19  Microbiology: 4/17 BCx x2: ngtd 4/17 MRSA PCR (-)   Goal of Therapy:  Pre-HD vancomycin level 15-25 mcg/mL Eradication of infection  Plan:  Continue Primaxin 250 Q12 Continue Levaquin at 500 mg Q48 hours F/u clinical course, abx LOT, vanc trough when indicated   Thank you for the consult.  Johny Drilling, PharmD Clinical Pharmacist Pager: [phone] Pharmacy: [phone] 03/29/2013 11:04 AM

## 2013-03-29 NOTE — Progress Notes (Signed)
[facility] Progress Note  Subjective:   States she receives benadryl at dialysis due to allergy (I have no record of this with dialysis information - though I believe she is given PO benadryl with HD tmts - Rx about 4000 units with each HD).  New onset jerking with intentional movement. Denies N or V.   Objective Filed Vitals:   03/28/13 1827 03/28/13 1838 03/28/13 2156 03/29/13 0507  BP: 68/43 62/38 84/40  76/49  Pulse: 88  84   Temp: 100.1 F (37.8 C)  99.4 F (37.4 C) 99.4 F (37.4 C)  TempSrc: Oral  Oral Oral  Resp:   16 16  Height:      Weight:   133.5 kg (294 lb 5 oz)   SpO2: 73%  94% 91%   Physical Exam General: Ill appearing; has myoclonic jerks with intentional movement in bed or when she takes deep inspiration  Heart: RRR Lungs: grossly clear without wheezes or rales Abdomen: obese Extremities: no significant edema Dialysis Access:  Left thigh graft + bruit  Dialysis Orders: Center: [facility] on TTS Optiflux 180.  EDW 127 kg HD Bath 2K 2.5 CaTime 4.75 hr Heparin 3K with 1K mid tmt. Access left thigh AVGG - REVERSED 400/800 hectorol 2 mcg IV/HD 9 for Ca support) Epogen-none. Recent labs: Hgb 11.6 03/19/13 29% sat 3/14, ferritin 1250 1/14. iPTH 13 - s/p parathyroidectomy   Assessment/Plan:  1. Febrile illness/cough - Tmax 100.1 CXR neg; on Vanc, levaquin and primaxin; outpt blood cultures from 4/17 are negative to date on Saturday no obvious infection source old and current HD access sites not grossly infected, +cough, on Rx for HCAP per primary 2. ESRD - cont TTS HD(our records state her thigh graft is reversed-it is not per tech): the pt tells me the staff has been usually only cannulating the arterial side of graft since stent placed in venous limb 2/25 during declot due to difficulty cannulating venous limb (could in part be due to obesity- I confirmed with tech that some staff only cannulate arterial side with one needle up and the venous down but kinetics are  still ok) 3. Chronic hypotension/volume - on midodrine prone to low BPs on HD; the closest she has got to EDW recently was 127.5; BP may drop as low as 70-80 during treatments but pre BPs can vary from 90 to 130 sitting and 90 to 138 standing pre HD; average UFs about 3 kg +/-Body habitus and multiple failed acceses makes it difficult to get accurate BP readings. Pre and post weights with HD 126.4/125.3 with UF  Of 1.3 yesterday. Bed scale weight last night is erroneous. She is below EDW. Na with am labs is 133; does not look volume overloaded 4. Anemia - off epo at present; follow; If Hgb < 11  initiate ESA- stable for now 5. Metabolic bone disease - Continue hectorol for Ca support post parathyroidectomy; phoslo and 2.5 bath - resume at lower dose of 1 due to suppresed iPTH. Ca today 9 6. Nutrition - high protein renal diet  7. DM - per primary; A1C ok. 8. 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 Jonelle Sidle is her primary; her dialysis unit does not follow INRs) 9. Myoclonic jerks - worse with intentional movement; electrolytes/glucose ok.  Myriam Jacobson, PA-C [place] 03/29/2013,8:58 AM  LOS: 3 days   Patient seen and examined.  Agree with assessment and plan as above. Kelly Splinter  MD  [phone] pgr    [phone] cell 03/29/2013, 12:31 PM   Additional Objective Labs: Basic Metabolic Panel:  Recent Labs Lab 03/27/13 1032 03/28/13 0730 03/29/13 0437  NA 136 135 133*  K 4.0 4.3 3.9  CL 97 95* 94*  CO2 29 26 29   GLUCOSE 73 113* 89  BUN 21 30* 15  CREATININE 6.10* 7.73* 5.72*  CALCIUM 9.0 9.2 9.0  PHOS  --  5.8*  --    Liver Function Tests:  Recent Labs Lab 03/27/13 1032 03/28/13 0730  AST 15  --   ALT 13  --   ALKPHOS 129*  --   BILITOT 0.4  --   PROT 7.8  --   ALBUMIN 3.3* 3.8   CBC:  Recent Labs Lab 03/26/13 1420 03/27/13 1032 03/28/13 0730 03/29/13 0437  WBC 6.9 5.7 7.9 6.8  NEUTROABS 5.8   --   --   --   HGB 11.9* 10.7* 11.0* 11.2*  HCT 36.3 34.5* 35.3* 36.3  MCV 90.3 92.5 93.1 94.8  PLT 171 168 167 189   Blood Culture    Component Value Date/Time   SDES BLOOD LEFT HAND 03/26/2013 2210   SPECREQUEST BOTTLES DRAWN AEROBIC ONLY 5CC 03/26/2013 2210   CULT        BLOOD CULTURE RECEIVED NO GROWTH TO DATE CULTURE WILL BE HELD FOR 5 DAYS BEFORE ISSUING A FINAL NEGATIVE REPORT 03/26/2013 2210   REPTSTATUS PENDING 03/26/2013 2210   CBG:  Recent Labs Lab 03/27/13 2252 03/28/13 1315 03/28/13 1703 03/28/13 2203 03/29/13 0749  GLUCAP 104* 78 102* 84 68*  Medications: . sodium chloride 50 mL/hr at 03/26/13 2240  . heparin 1,850 Units/hr (03/29/13 0211)   . calcium acetate  1,334 mg Oral TID WC  . doxercalciferol  1 mcg Intravenous Q T,Th,Sa-HD  . imipenem-cilastatin  250 mg Intravenous Q12H  . insulin glargine  35 Units Subcutaneous QHS  . metoCLOPramide  5 mg Oral QHS  . midodrine  5 mg Oral BID  . sodium chloride  3 mL Intravenous Q12H  . sodium chloride  3 mL Intravenous Q12H  . vancomycin  1,000 mg Intravenous Q T,Th,Sa-HD

## 2013-03-29 NOTE — Progress Notes (Signed)
Hypoglycemic Event  CBG: 47  Treatment: d50 25 ml IV; sprite graham crackers  Symptoms:  blurry vision ; pt sees spots  Follow-up CBG: Time:1130 CBG Result:104  Possible Reasons for Event: inadequate meal  Comments/MD notified Dr. Lockie Pares, Marnette Burgess  Remember to initiate Hypoglycemia Order Set & complete

## 2013-03-29 NOTE — Progress Notes (Signed)
Placed patient on cpap auto titrate settings with 3l o2 bled in.

## 2013-03-29 NOTE — Progress Notes (Signed)
[facility] PROGRESS NOTE  [name] [id number] DOB: [birth year] DOA: 03/26/2013 PCP: Donetta Potts, MD  Assessment/Plan: Fever and chills/Cough:  - Her blood pressure has been stable in ED she she takes Midodrine for hypotension, she was given one dose of vancomycin at the dialysis center and blood cultures were ordered.  - I will start treatment for health care associated-acquired pneumonia with vancomycin and cefepimeHer chest x-ray does not show an infiltrate, she does not have elevated white count on her CBC.  -no fever - Sputum cultures. Blood cultures - negative thus far Plan to d/c abx in AM if remain negative  ESRD on hemodialysis:  - consult renal for further dialysis.   Diarrhea  - none since admission  OSA (obstructive sleep apnea)  - now says she does wear c-pap at home -have ordered.   Anemia of chronic disease  - stable management per renal.   Secondary hyperparathyroidism  - Stable management per renal.   Insulin-requiring or dependent type II diabetes mellitus:  - I will continue her current dose of Lantus and start her on sliding scale insulin in house.   H/o PE- called PCP's office who states she takes 150 mg lovenox daily as she still had clots on coumadin but this prescription was given in May 2013 and patient did not fill and never went back to the PCP to follow up.  Will not plan to d/c on anticoagulation last blood clot was 2006 and patient has not been on blood thinners and is not showing signs of any clots currently.  No pain in legs, no hypoxia  Hypoglycemia -D50 1/2 amp Decrease lantus and add SSI -monitor closely    Code Status: full Family Communication: patient at bedside Disposition Plan:    Consultants:  renal  Procedures:  HD  Antibiotics:    HPI/Subjective: c/o twitching Says she wears Cpap at home BS low this AM- did not eat breakfast   Objective: Filed Vitals:   03/28/13 2156 03/29/13 0507  03/29/13 1030 03/29/13 1115  BP: 84/40 76/49 77/34  95/53  Pulse: 84  81 73  Temp: 99.4 F (37.4 C) 99.4 F (37.4 C) 98.2 F (36.8 C) 98.4 F (36.9 C)  TempSrc: Oral Oral Oral Oral  Resp: 16 16 17 16   Height:      Weight: 133.5 kg (294 lb 5 oz)     SpO2: 94% 91% 85% 96%    Intake/Output Summary (Last 24 hours) at 03/29/13 1133 Last data filed at 03/29/13 0700  Gross per 24 hour  Intake 668.36 ml  Output   1293 ml  Net -624.64 ml   Filed Weights   03/28/13 0639 03/28/13 1239 03/28/13 2156  Weight: 126.4 kg (278 lb 10.6 oz) 125.3 kg (276 lb 3.8 oz) 133.5 kg (294 lb 5 oz)    Exam:   General:  Sleepy today  Cardiovascular: rrr  Respiratory: diminished  Abdomen: +Bs, soft, NT  Musculoskeletal: moves all 4 ext   Data Reviewed: Basic Metabolic Panel:  Recent Labs Lab 03/26/13 1420 03/27/13 1032 03/28/13 0730 03/29/13 0437  NA 137 136 135 133*  K 3.3* 4.0 4.3 3.9  CL 97 97 95* 94*  CO2 30 29 26 29   GLUCOSE 108* 73 113* 89  BUN 12 21 30* 15  CREATININE 4.24* 6.10* 7.73* 5.72*  CALCIUM 9.6 9.0 9.2 9.0  PHOS  --   --  5.8*  --    Liver Function Tests:  Recent Labs Lab 03/27/13 1032 03/28/13 0730  AST 15  --   ALT 13  --   ALKPHOS 129*  --   BILITOT 0.4  --   PROT 7.8  --   ALBUMIN 3.3* 3.8   No results found for this basename: LIPASE, AMYLASE,  in the last 168 hours No results found for this basename: AMMONIA,  in the last 168 hours CBC:  Recent Labs Lab 03/26/13 1420 03/27/13 1032 03/28/13 0730 03/29/13 0437  WBC 6.9 5.7 7.9 6.8  NEUTROABS 5.8  --   --   --   HGB 11.9* 10.7* 11.0* 11.2*  HCT 36.3 34.5* 35.3* 36.3  MCV 90.3 92.5 93.1 94.8  PLT 171 168 167 189   Cardiac Enzymes: No results found for this basename: CKTOTAL, CKMB, CKMBINDEX, TROPONINI,  in the last 168 hours BNP (last 3 results) No results found for this basename: PROBNP,  in the last 8760 hours CBG:  Recent Labs Lab 03/28/13 1315 03/28/13 1703 03/28/13 2203  03/29/13 0749 03/29/13 1113  GLUCAP 78 102* 84 68* 47*    Recent Results (from the past 240 hour(s))  CULTURE, BLOOD (ROUTINE X 2)     Status: None   Collection Time    03/26/13 10:00 PM      Result Value Range Status   Specimen Description BLOOD RIGHT HAND   Final   Special Requests BOTTLES DRAWN AEROBIC ONLY 5CC   Final   Culture  Setup Time 03/27/2013 07:18   Final   Culture     Final   Value:        BLOOD CULTURE RECEIVED NO GROWTH TO DATE CULTURE WILL BE HELD FOR 5 DAYS BEFORE ISSUING A FINAL NEGATIVE REPORT   Report Status PENDING   Incomplete  CULTURE, BLOOD (ROUTINE X 2)     Status: None   Collection Time    03/26/13 10:10 PM      Result Value Range Status   Specimen Description BLOOD LEFT HAND   Final   Special Requests BOTTLES DRAWN AEROBIC ONLY 5CC   Final   Culture  Setup Time 03/27/2013 07:18   Final   Culture     Final   Value:        BLOOD CULTURE RECEIVED NO GROWTH TO DATE CULTURE WILL BE HELD FOR 5 DAYS BEFORE ISSUING A FINAL NEGATIVE REPORT   Report Status PENDING   Incomplete  MRSA PCR SCREENING     Status: None   Collection Time    03/26/13 10:53 PM      Result Value Range Status   MRSA by PCR NEGATIVE  NEGATIVE Final   Comment:            The GeneXpert MRSA Assay (FDA     approved for NASAL specimens     only), is one component of a     comprehensive MRSA colonization     surveillance program. It is not     intended to diagnose MRSA     infection nor to guide or     monitor treatment for     MRSA infections.     Studies: No results found.  Scheduled Meds: . calcium acetate  1,334 mg Oral TID WC  . dextrose  25 mL Intravenous Once  . doxercalciferol  1 mcg Intravenous Q T,Th,Sa-HD  . heparin subcutaneous  5,000 Units Subcutaneous Q8H  . imipenem-cilastatin  250 mg Intravenous Q12H  . insulin aspart  0-5 Units Subcutaneous QHS  . insulin aspart  0-9 Units Subcutaneous TID  WC  . insulin glargine  30 Units Subcutaneous QHS  . metoCLOPramide  5  mg Oral QHS  . midodrine  5 mg Oral BID  . sodium chloride  3 mL Intravenous Q12H  . sodium chloride  3 mL Intravenous Q12H  . vancomycin  1,000 mg Intravenous Q T,Th,Sa-HD   Continuous Infusions: . sodium chloride 50 mL/hr at 03/26/13 2240    Principal Problem:   Fever and chills Active Problems:   Cough   Diarrhea   ESRD on hemodialysis   OSA (obstructive sleep apnea)   Anemia of chronic disease   Secondary hyperparathyroidism   Insulin-requiring or dependent type II diabetes mellitus    Time spent: [place] [place] [facility] Pager [phone]. If 7PM-7AM, please contact night-coverage at [email or web address], password [facility] 03/29/2013, 11:33 AM  LOS: 3 days

## 2013-03-29 NOTE — Progress Notes (Signed)
03/29/13 1417 nsg Patient desaturates 60- 70% in room air; 92% at 3L; wears cpap during naptime per md order but pt takes it off claims she feels hot with it.

## 2013-03-30 ENCOUNTER — Inpatient Hospital Stay (HOSPITAL_COMMUNITY): Payer: Medicare Other

## 2013-03-30 DIAGNOSIS — J9600 Acute respiratory failure, unspecified whether with hypoxia or hypercapnia: Secondary | ICD-10-CM

## 2013-03-30 LAB — CBC
HCT: 35 % — ABNORMAL LOW (ref 36.0–46.0)
Hemoglobin: 10.8 g/dL — ABNORMAL LOW (ref 12.0–15.0)
MCH: 29 pg (ref 26.0–34.0)
MCHC: 30.9 g/dL (ref 30.0–36.0)
MCV: 93.8 fL (ref 78.0–100.0)
Platelets: 177 10*3/uL (ref 150–400)
RBC: 3.73 MIL/uL — ABNORMAL LOW (ref 3.87–5.11)
RDW: 14.2 % (ref 11.5–15.5)
WBC: 7.1 10*3/uL (ref 4.0–10.5)

## 2013-03-30 LAB — GLUCOSE, CAPILLARY
Glucose-Capillary: 94 mg/dL (ref 70–99)
Glucose-Capillary: 64 mg/dL — ABNORMAL LOW (ref 70–99)
Glucose-Capillary: 107 mg/dL — ABNORMAL HIGH (ref 70–99)

## 2013-03-30 MED ORDER — TECHNETIUM TO 99M ALBUMIN AGGREGATED
6.0000 | Freq: Once | INTRAVENOUS | Status: AC | PRN
  Administered 2013-03-30: 6 via INTRAVENOUS

## 2013-03-30 MED ORDER — TECHNETIUM TC 99M DIETHYLENETRIAME-PENTAACETIC ACID: 40.0000 | Freq: Once | INTRAVENOUS | Status: AC | PRN

## 2013-03-30 MED ORDER — DARBEPOETIN ALFA-POLYSORBATE 25 MCG/0.42ML IJ SOLN
25.0000 ug | INTRAMUSCULAR | Status: DC
  Filled 2013-03-30: qty 0.42

## 2013-03-30 NOTE — Progress Notes (Addendum)
[facility] PROGRESS NOTE  [name] [id number] DOB: 01-28-1961 DOA: 03/26/2013 PCP: Donetta Potts, MD  Assessment/Plan: Acute respiratory failure- requiring O2- with the history of noncompliance and PE- will get V/Q scan as patient says allergic to Iohexol; not appearing to be volume overloaded   Fever and chills/Cough:  - Her blood pressure has been stable in ED she she takes Midodrine for hypotension, she was given one dose of vancomycin at the dialysis center and blood cultures were ordered.  - I will start treatment for health care associated-acquired pneumonia with vancomycin and cefepimeHer chest x-ray does not show an infiltrate, she does not have elevated white count on her CBC.  -no fever - Sputum cultures. Blood cultures - negative thus far Plan to d/c abx in AM if remain negative  ESRD on hemodialysis:  - consult renal for further dialysis.   Diarrhea  - none since admission  OSA (obstructive sleep apnea)  - now says she does wear c-pap at home -have ordered but patient refusing  Anemia of chronic disease  - stable management per renal.   Secondary hyperparathyroidism  - Stable management per renal.   Insulin-requiring or dependent type II diabetes mellitus:  - decrease lantus secondary to hypoglycemia and start her on sliding scale insulin in house.   H/o PE- called PCP's office who states she takes 150 mg lovenox daily as she still had clots on coumadin but this prescription was given in May 2013 and patient did not fill and never went back to the PCP to follow up.     Hypoglycemia -D50 1/2 amp Decrease lantus and add SSI -monitor closely  Twitching episodes -morphine d/c'd  Case discussed with Dr. Jonnie Finner  Code Status: full Family Communication: patient at bedside Disposition Plan:    Consultants:  renal  Procedures:  HD  Antibiotics:    HPI/Subjective: would not wear c-pap at night O2 sat drops on RA to the  70-80s No CP   Objective: Filed Vitals:   03/29/13 2235 03/30/13 0250 03/30/13 0638 03/30/13 1011  BP: 86/41 87/45 91/59  95/53  Pulse: 76 89 79 50  Temp: 98.6 F (37 C) 98.1 F (36.7 C) 97.8 F (36.6 C) 97.4 F (36.3 C)  TempSrc: Oral Oral Oral   Resp: 18 16 18 17   Height:      Weight:      SpO2: 96% 95% 96% 95%    Intake/Output Summary (Last 24 hours) at 03/30/13 1226 Last data filed at 03/30/13 1011  Gross per 24 hour  Intake    530 ml  Output      0 ml  Net    530 ml   Filed Weights   03/28/13 1239 03/28/13 2156 03/29/13 1242  Weight: 125.3 kg (276 lb 3.8 oz) 133.5 kg (294 lb 5 oz) 128.5 kg (283 lb 4.7 oz)    Exam:   General:  Sleepy today  Cardiovascular: rrr  Respiratory: diminished  Abdomen: +Bs, soft, NT  Musculoskeletal: moves all 4 ext   Data Reviewed: Basic Metabolic Panel:  Recent Labs Lab 03/26/13 1420 03/27/13 1032 03/28/13 0730 03/29/13 0437  NA 137 136 135 133*  K 3.3* 4.0 4.3 3.9  CL 97 97 95* 94*  CO2 30 29 26 29   GLUCOSE 108* 73 113* 89  BUN 12 21 30* 15  CREATININE 4.24* 6.10* 7.73* 5.72*  CALCIUM 9.6 9.0 9.2 9.0  PHOS  --   --  5.8*  --    Liver Function Tests:  Recent Labs Lab 03/27/13 1032 03/28/13 0730  AST 15  --   ALT 13  --   ALKPHOS 129*  --   BILITOT 0.4  --   PROT 7.8  --   ALBUMIN 3.3* 3.8   No results found for this basename: LIPASE, AMYLASE,  in the last 168 hours No results found for this basename: AMMONIA,  in the last 168 hours CBC:  Recent Labs Lab 03/26/13 1420 03/27/13 1032 03/28/13 0730 03/29/13 0437 03/30/13 0622  WBC 6.9 5.7 7.9 6.8 7.1  NEUTROABS 5.8  --   --   --   --   HGB 11.9* 10.7* 11.0* 11.2* 10.8*  HCT 36.3 34.5* 35.3* 36.3 35.0*  MCV 90.3 92.5 93.1 94.8 93.8  PLT 171 168 167 189 177   Cardiac Enzymes: No results found for this basename: CKTOTAL, CKMB, CKMBINDEX, TROPONINI,  in the last 168 hours BNP (last 3 results) No results found for this basename: PROBNP,  in the  last 8760 hours CBG:  Recent Labs Lab 03/29/13 1952 03/29/13 2239 03/30/13 0252 03/30/13 0723 03/30/13 1115  GLUCAP 94 104* 90 78 64*    Recent Results (from the past 240 hour(s))  CULTURE, BLOOD (ROUTINE X 2)     Status: None   Collection Time    03/26/13 10:00 PM      Result Value Range Status   Specimen Description BLOOD RIGHT HAND   Final   Special Requests BOTTLES DRAWN AEROBIC ONLY 5CC   Final   Culture  Setup Time 03/27/2013 07:18   Final   Culture     Final   Value:        BLOOD CULTURE RECEIVED NO GROWTH TO DATE CULTURE WILL BE HELD FOR 5 DAYS BEFORE ISSUING A FINAL NEGATIVE REPORT   Report Status PENDING   Incomplete  CULTURE, BLOOD (ROUTINE X 2)     Status: None   Collection Time    03/26/13 10:10 PM      Result Value Range Status   Specimen Description BLOOD LEFT HAND   Final   Special Requests BOTTLES DRAWN AEROBIC ONLY 5CC   Final   Culture  Setup Time 03/27/2013 07:18   Final   Culture     Final   Value:        BLOOD CULTURE RECEIVED NO GROWTH TO DATE CULTURE WILL BE HELD FOR 5 DAYS BEFORE ISSUING A FINAL NEGATIVE REPORT   Report Status PENDING   Incomplete  MRSA PCR SCREENING     Status: None   Collection Time    03/26/13 10:53 PM      Result Value Range Status   MRSA by PCR NEGATIVE  NEGATIVE Final   Comment:            The GeneXpert MRSA Assay (FDA     approved for NASAL specimens     only), is one component of a     comprehensive MRSA colonization     surveillance program. It is not     intended to diagnose MRSA     infection nor to guide or     monitor treatment for     MRSA infections.     Studies: No results found.  Scheduled Meds: . calcium acetate  1,334 mg Oral TID WC  . [START ON 03/31/2013] darbepoetin (ARANESP) injection - DIALYSIS  25 mcg Intravenous Q Tue-HD  . dextrose  25 mL Intravenous Once  . doxercalciferol  1 mcg Intravenous Q T,Th,Sa-HD  .  heparin subcutaneous  5,000 Units Subcutaneous Q8H  . imipenem-cilastatin  250 mg  Intravenous Q12H  . insulin aspart  0-5 Units Subcutaneous QHS  . insulin aspart  0-9 Units Subcutaneous TID WC  . insulin glargine  25 Units Subcutaneous QHS  . metoCLOPramide  5 mg Oral QHS  . midodrine  5 mg Oral BID  . sodium chloride  3 mL Intravenous Q12H  . sodium chloride  3 mL Intravenous Q12H  . vancomycin  1,000 mg Intravenous Q T,Th,Sa-HD   Continuous Infusions: . sodium chloride 50 mL/hr at 03/26/13 2240    Principal Problem:   Fever and chills Active Problems:   Cough   Diarrhea   ESRD on hemodialysis   OSA (obstructive sleep apnea)   Anemia of chronic disease   Secondary hyperparathyroidism   Insulin-requiring or dependent type II diabetes mellitus    Time spent: [place] [place] [facility] Pager [phone]. If 7PM-7AM, please contact night-coverage at [email or web address], password [facility] 03/30/2013, 12:26 PM  LOS: 4 days

## 2013-03-30 NOTE — Significant Event (Signed)
CBG: 64  Treatment: 15 GM carbohydrate snack  Symptoms: None  Follow-up CBG: Time:1211 CBG Result:89  Possible Reasons for Event: Inadequate meal intake  Comments/MD notified:DR. Eliseo Squires  Jobe Igo RN

## 2013-03-30 NOTE — Progress Notes (Signed)
Patient refused CPAP for the night. Patient on 3lpm with Sp02=95%

## 2013-03-30 NOTE — Progress Notes (Signed)
Utilization review completed.  

## 2013-03-30 NOTE — Progress Notes (Signed)
Subjective:  Feeling better, no more jerks or tremors, no current complaints, breathing well.  Objective: Vital signs in last 24 hours: Temp:  [97.8 F (36.6 C)-98.6 F (37 C)] 97.8 F (36.6 C) (04/21 [id number]) Pulse Rate:  [71-89] 79 (04/21 0638) Resp:  [16-18] 18 (04/21 0638) BP: (73-95)/(34-59) 91/59 mmHg (04/21 0638) SpO2:  [74 %-98 %] 96 % (04/21 [id number]) Weight:  [128.5 kg (283 lb 4.7 oz)] 128.5 kg (283 lb 4.7 oz) (04/20 1242) Weight change: 3.2 kg (7 lb 0.9 oz)  Intake/Output from previous day: 04/20 0701 - 04/21 0700 In: 470 [P.O.:360; I.V.:10; IV Piggyback:100] Out: -    EXAM: General appearance:  Alert, in no apparent distress Resp:  CTA without rales, rhonchi, or wheezes Cardio:  RRR without murmur or rub GI:  + BS, soft and nontender Extremities:  No edema Access: Left thigh AVG with + bruit  Lab Results:  Recent Labs  03/29/13 0437 03/30/13 0622  WBC 6.8 7.1  HGB 11.2* 10.8*  HCT 36.3 35.0*  PLT 189 177   BMET:  Recent Labs  03/27/13 1032 03/28/13 0730 03/29/13 0437  NA 136 135 133*  K 4.0 4.3 3.9  CL 97 95* 94*  CO2 29 26 29   GLUCOSE 73 113* 89  BUN 21 30* 15  CREATININE 6.10* 7.73* 5.72*  CALCIUM 9.0 9.2 9.0  ALBUMIN 3.3* 3.8  --    No results found for this basename: PTH,  in the last 72 hours Iron Studies: No results found for this basename: IRON, TIBC, TRANSFERRIN, FERRITIN,  in the last 72 hours  Dialysis Orders: Center: [facility] on TTS Optiflux 180.  EDW 127 kg HD Bath 2K 2.5 CaTime 4.75 hr Heparin 3K with 1K mid tmt. Access left thigh AVGG - REVERSED 400/800 hectorol 2 mcg IV/HD 9 for Ca support) Epogen-none. Recent labs: Hgb 11.6 03/19/13 29% sat 3/14, ferritin 1250 1/14. iPTH 13 - s/p parathyroidectomy   Assessment/Plan: 1. Febrile illness/cough - outpatient blood cultures 4/17 negative to date, + cough, started Primaxin, in addition to Vancomycin, for HCAP. 2. Monoclonal jerks - believed to be secondary to Morphine, resolved after Morphine  was stopped.  No narcotic meds. 3. ESRD - HD on TTS @ [place]; K 3.9, using only arterial side of AVG since stent placement in venous limb during declot 2/25.  Next HD tomorrow. 4. Hypotension/Volume - BP 95/53, chronic, may drop as low as 70-80 during HD, on Midodrine 5 mg bid; current wt 128.5 kg with EDW 127. 5. Anemia - Hgb 10.8, off outpatient Epogen.  Aranesp 25 mcg tomorrow. 6. Secondary hyperparathyroidism - s/p parathyroidectomy, Ca 9, P 5.8; Hectorol 2 mcg, Phoslo 2 with meals. 7. Nutrition - Alb 3.8, high protein renal diet. 8. DM - on Insulin per primary. 9. Hx PE - currently on SQ Heparin.   LOS: 4 days   [name],[name] 03/30/2013,10:02 AM  Patient seen and examined.  Agree with assessment and plan as above.  Feels better off morphine, was causing myoclonus which is not uncommon with ESRD pts due to accumulating metabolites.  D/W Dr Eliseo Squires, low SaO2 today, hx of VTE not taking her anticoagulants at home, Does not look vol overloaded. V/Q or CTA chest will be ordered for today.  Fevers better and otherwise is doing well.  Kelly Splinter  MD [phone] pgr    [phone] cell 03/30/2013, 11:51 AM

## 2013-03-31 LAB — RENAL FUNCTION PANEL
BUN: 33 mg/dL — ABNORMAL HIGH (ref 6–23)
Calcium: 9.4 mg/dL (ref 8.4–10.5)
Creatinine, Ser: 9.79 mg/dL — ABNORMAL HIGH (ref 0.50–1.10)
Glucose, Bld: 97 mg/dL (ref 70–99)
Phosphorus: 5.2 mg/dL — ABNORMAL HIGH (ref 2.3–4.6)

## 2013-03-31 LAB — GLUCOSE, CAPILLARY
Glucose-Capillary: 53 mg/dL — ABNORMAL LOW (ref 70–99)
Glucose-Capillary: 60 mg/dL — ABNORMAL LOW (ref 70–99)
Glucose-Capillary: 79 mg/dL (ref 70–99)

## 2013-03-31 LAB — CBC
HCT: 31.2 % — ABNORMAL LOW (ref 36.0–46.0)
Hemoglobin: 10 g/dL — ABNORMAL LOW (ref 12.0–15.0)
MCH: 29.2 pg (ref 26.0–34.0)
MCHC: 32.1 g/dL (ref 30.0–36.0)
RDW: 13.7 % (ref 11.5–15.5)

## 2013-03-31 MED ORDER — INSULIN GLARGINE 100 UNIT/ML ~~LOC~~ SOLN
15.0000 [IU] | Freq: Every day | SUBCUTANEOUS | Status: DC
  Filled 2013-03-31: qty 0.15

## 2013-03-31 MED ORDER — LIDOCAINE HCL (PF) 1 % IJ SOLN: 5.0000 mL | INTRAMUSCULAR | Status: DC | PRN

## 2013-03-31 MED ORDER — INSULIN GLARGINE 100 UNIT/ML ~~LOC~~ SOLN: 15.0000 [IU] | Freq: Every day | SUBCUTANEOUS | Status: DC

## 2013-03-31 MED ORDER — PENTAFLUOROPROP-TETRAFLUOROETH EX AERO: 1.0000 "application " | INHALATION_SPRAY | CUTANEOUS | Status: DC | PRN

## 2013-03-31 MED ORDER — SODIUM CHLORIDE 0.9 % IV SOLN: 100.0000 mL | INTRAVENOUS | Status: DC | PRN

## 2013-03-31 MED ORDER — HEPARIN SODIUM (PORCINE) 1000 UNIT/ML DIALYSIS: 1000.0000 [IU] | INTRAMUSCULAR | Status: DC | PRN

## 2013-03-31 MED ORDER — LIDOCAINE-PRILOCAINE 2.5-2.5 % EX CREA: 1.0000 "application " | TOPICAL_CREAM | CUTANEOUS | Status: DC | PRN

## 2013-03-31 MED ORDER — DOXERCALCIFEROL 4 MCG/2ML IV SOLN
INTRAVENOUS | Status: AC
  Administered 2013-03-31: 1 ug via INTRAVENOUS
  Filled 2013-03-31: qty 2

## 2013-03-31 MED ORDER — HEPARIN SODIUM (PORCINE) 1000 UNIT/ML DIALYSIS: 3000.0000 [IU] | Freq: Once | INTRAMUSCULAR | Status: DC

## 2013-03-31 MED ORDER — MIDODRINE HCL 5 MG PO TABS
ORAL_TABLET | ORAL | Status: AC
  Filled 2013-03-31: qty 1

## 2013-03-31 MED ORDER — ALTEPLASE 2 MG IJ SOLR: 2.0000 mg | Freq: Once | INTRAMUSCULAR | Status: DC | PRN

## 2013-03-31 MED ORDER — NEPRO/CARBSTEADY PO LIQD: 237.0000 mL | ORAL | Status: DC | PRN

## 2013-03-31 MED ORDER — DARBEPOETIN ALFA-POLYSORBATE 25 MCG/0.42ML IJ SOLN
INTRAMUSCULAR | Status: AC
  Administered 2013-03-31: 25 ug via INTRAVENOUS
  Filled 2013-03-31: qty 0.42

## 2013-03-31 NOTE — Progress Notes (Signed)
Subjective:  Feeling better, no more jerks or tremors, no current complaints, breathing well.  Objective: Vital signs in last 24 hours: Temp:  [96 F (35.6 C)-98.5 F (36.9 C)] 97.5 F (36.4 C) (04/22 1222) Pulse Rate:  [68-80] 73 (04/22 1222) Resp:  [11-21] 17 (04/22 1222) BP: (71-110)/(26-63) 98/37 mmHg (04/22 1222) SpO2:  [95 %-100 %] 100 % (04/22 1222) Weight:  [128.6 kg (283 lb 8.2 oz)-130.4 kg (287 lb 7.7 oz)] 128.6 kg (283 lb 8.2 oz) (04/22 1222) Weight change:   Intake/Output from previous day: 04/21 0701 - 04/22 0700 In: 340 [P.O.:240; IV Piggyback:100] Out: -  Total I/O In: -  Out: 1036 [Other:1036] EXAM: General appearance:  Alert, in no apparent distress Resp:  CTA without rales, rhonchi, or wheezes Cardio:  RRR without murmur or rub GI:  + BS, soft and nontender Extremities:  No edema Access: Left thigh AVG with + bruit  Lab Results:  Recent Labs  03/30/13 0622 03/31/13 0752  WBC 7.1 6.3  HGB 10.8* 10.0*  HCT 35.0* 31.2*  PLT 177 165   BMET:   Recent Labs  03/29/13 0437 03/31/13 0752  NA 133* 136  K 3.9 3.7  CL 94* 97  CO2 29 26  GLUCOSE 89 97  BUN 15 33*  CREATININE 5.72* 9.79*  CALCIUM 9.0 9.4  ALBUMIN  --  3.2*   No results found for this basename: PTH,  in the last 72 hours Iron Studies: No results found for this basename: IRON, TIBC, TRANSFERRIN, FERRITIN,  in the last 72 hours  Dialysis Orders: Center: [facility] on TTS Optiflux 180.  EDW 127 kg HD Bath 2K 2.5 CaTime 4.75 hr Heparin 3K with 1K mid tmt. Access left thigh AVGG - REVERSED 400/800 hectorol 2 mcg IV/HD 9 for Ca support) Epogen-none. Recent labs: Hgb 11.6 03/19/13 29% sat 3/14, ferritin 1250 1/14. iPTH 13 - s/p parathyroidectomy   Assessment/Plan: 1. Febrile illness/cough - outpatient blood cultures 4/17 negative to date, abx stopped per primary. D/W Dr Eliseo Squires, [place] for discharge today 2. ESRD - HD on TTS @ [place]; K 3.9, using only arterial side of AVG since stent placement in venous  limb during declot 2/25.  HD today 3. Hypotension/Volume - BP 95/53, chronic, may drop as low as 70-80 during HD, on Midodrine 5 mg bid; current wt 128.5 kg with EDW 127. 4. Anemia - Hgb 10.8, off outpatient Epogen.  Aranesp 25 mcg tomorrow. 5. Secondary hyperparathyroidism - s/p parathyroidectomy, Ca 9, P 5.8; Hectorol 2 mcg, Phoslo 2 with meals. 6. Nutrition - Alb 3.8, high protein renal diet. 7. DM - on Insulin per primary. 8. Hx PE - currently on SQ Heparin. V/Q low prob for PE  Kelly Splinter  MD [phone] pgr    [phone] cell 03/31/2013, 1:49 PM

## 2013-03-31 NOTE — Progress Notes (Signed)
Hemodialysis- total UF 1L d/t low bp today. Pt baseline is low but continued to drop into the low 70s/20s. Unable to uf to goal. System clotted with 50minutes remaining. All blood able to be given back. Pt has no complaints. Transported back to 6700 in stable condition.

## 2013-03-31 NOTE — Procedures (Signed)
I was present at this dialysis session. I have reviewed the session itself and made appropriate changes.   Kelly Splinter, MD [employer] 03/31/2013, 3:26 PM

## 2013-03-31 NOTE — Progress Notes (Signed)
Patient did not drop her oxygen saturations while ambulating.  Patient without complaints of dizziness.

## 2013-03-31 NOTE — Discharge Summary (Signed)
Physician Discharge Summary  [name] [id number] DOB: January 26, 1961 DOA: 03/26/2013  PCP: Donetta Potts, MD  Admit date: 03/26/2013 Discharge date: 03/31/2013  Time spent: 35 minutes  Recommendations for Outpatient Follow-up:  1. Needs close monitoring of BS 2. Anticoagulation needs to be addressed by PCP- no sign of PE on V/Q scan done inpatient  Discharge Diagnoses:  Principal Problem:   Fever and chills Active Problems:   Cough   Diarrhea   ESRD on hemodialysis   OSA (obstructive sleep apnea)   Anemia of chronic disease   Secondary hyperparathyroidism   Insulin-requiring or dependent type II diabetes mellitus   Discharge Condition: improved  Diet recommendation: renal/diabetic  Filed Weights   03/29/13 1242 03/31/13 0734 03/31/13 1222  Weight: 128.5 kg (283 lb 4.7 oz) 130.4 kg (287 lb 7.7 oz) 128.6 kg (283 lb 8.2 oz)    History of present illness:  HPI: [name] is a 52 y.o. 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 Blood cultures were obtained was given one dose of vancomycin to the emergency room.she has not been treated recently with any antibiotics, has not been in the hospital in the last 3 months. She relates no sick contacts or any new medications.  In the emergency:  Her temperature was checked which has remained high, her blood pressure has remained in his usual in the 90s, a CBC was checked in the does not show an elevation in her white count or left shift, she does have a decrease count her  absolute lymphocytic count, but her other hematologic panels are within normal limits. Chest x-ray was done that shows no infiltrates.   Hospital Course:  Acute respiratory failure- not requiring O2 now- with the history of noncompliance and PE- v/q scan negative- will need anticoagulation addressed by PCP  Fever and chills/Cough:  - Her blood pressure has been stable in ED she she takes Midodrine for hypotension, she was given one dose of vancomycin at the dialysis center and blood cultures were ordered.  - culture negative -treated with abx  ESRD on hemodialysis:  - consult renal    Diarrhea  - none since admission   OSA (obstructive sleep apnea)  - now says she does wear c-pap at home  -have ordered but patient refusing   Anemia of chronic disease  - stable management per renal.   Secondary hyperparathyroidism  - Stable management per renal.   Insulin-requiring or dependent type II diabetes mellitus:  - decrease lantus secondary to hypoglycemia  -? If patient follows diabetic diet at home   H/o PE- called PCP's office who states she takes 150 mg lovenox daily as she still had clots on coumadin but this prescription was given in May 2013 and patient did not fill and never went back to the PCP to follow up.  V/q negative  Hypoglycemia  -D50 1/2 amp  Decrease lantus and add SSI  -monitor closely  Twitching episodes  -morphine d/c'd      Procedures:  HD  Consultations:  renal  Discharge Exam: Filed Vitals:   03/31/13 1115 03/31/13 1130  03/31/13 1200 03/31/13 1222  BP: 82/40 85/45 77/38  98/37  Pulse:  78 72 73  Temp:    97.5 F (36.4 C)  TempSrc:    Oral  Resp:    17  Height:      Weight:    128.6 kg (283 lb 8.2 oz)  SpO2:    100%    General: A+Ox, NAD Cardiovascular: rrr Respiratory: clear anterior  Discharge Instructions      Discharge Orders   Future Orders Complete By Expires     Discharge instructions  As directed     Comments:       Check BS TID and bring to PCP Continue HD Renal/diabetic Continue CPAP at night    Increase activity slowly  As directed         Medication List    STOP taking these medications       enoxaparin 100 MG/ML injection  Commonly known as:  LOVENOX     warfarin 5 MG tablet  Commonly known as:  COUMADIN      TAKE these medications       albuterol 108 (90 BASE) MCG/ACT inhaler  Commonly known as:  PROVENTIL HFA;VENTOLIN HFA  Inhale 2 puffs into the lungs every 6 (six) hours as needed. For shortness of breath.     calcium acetate 667 MG capsule  Commonly known as:  PHOSLO  Take 1,334 mg by mouth 3 (three) times daily with meals.     insulin glargine 100 UNIT/ML injection  Commonly known as:  LANTUS  Inject 0.15 mLs (15 Units total) into the skin at bedtime.     metoCLOPramide 5 MG tablet  Commonly known as:  REGLAN  Take 5 mg by mouth at bedtime.     midodrine 5 MG tablet  Commonly known as:  PROAMATINE  Take 5 mg by mouth 2 (two) times daily.     multivitamins ther. w/minerals Tabs  Take 1 tablet by mouth daily.     NOVOLOG FLEXPEN 100 UNIT/ML injection  Generic drug:  insulin aspart  Inject 12 Units into the skin 3 (three) times daily before meals.       Follow-up Information   Follow up with COLADONATO,JOSEPH A, MD In 1 week.   Contact information:   [place] [address] [phone]        The results of significant diagnostics from this hospitalization (including imaging, microbiology, ancillary and laboratory) are listed below for reference.    Significant Diagnostic Studies: Dg Chest 2 View  03/30/2013  *RADIOLOGY REPORT*  Clinical Data: Shortness of breath and cough.  CHEST - 2 VIEW  Comparison: None.  Findings: Heart size is upper normal.  There is pulmonary vascular congestion without frank edema.  No focal airspace disease.  Trace pleural effusions noted.  IMPRESSION: Trace bilateral pleural effusions and  pulmonary vascular congestion.   Original Report Authenticated By: Orlean Patten, M.D.    Dg Chest 2 View  03/26/2013  *RADIOLOGY REPORT*  Clinical Data: Cough, fever, shortness of breath and chest discomfort.  History of hypertension and diabetes.  CHEST - 2 VIEW  Comparison: 10/13/2010.  Findings: 1333 hours.  There is mild patient rotation to the left. Heart size and mediastinal contours are stable.  The lungs are now clear.  There is no pleural effusion or pneumothorax.  Osteophytes are noted throughout the thoracic spine associated with a mild convex right scoliosis.  IMPRESSION: Interval improved aeration of the lungs.  No  acute cardiopulmonary process.   Original Report Authenticated By: Richardean Sale, M.D.    Nm Pulmonary Perf And Vent  03/30/2013  *RADIOLOGY REPORT*  Clinical Data: Hypoxemia.  NM PULMONARY VENTILATION AND PERFUSION SCAN  Radiopharmaceutical: 40 mCi technetium DTPA 80 by inhalation and 6 mCi technetium MAA by IV.  Comparison: Chest x-rays dated 04/21 and 03/26/2013  Findings: There are perfusion defects in the anterior basal and apical posterior segments of the right lung.  The perfusion to that area and to the   remainder of both lungs is normal.  No other perfusion abnormalities.  Chest x-ray demonstrates slight increased density over the inferior aspect of the thoracic spine on the lateral chest x-ray.  IMPRESSION: Mismatched perfusion defects at the right lung base with an abnormal chest x-ray in that area.  This fits criteria for low probability for pulmonary embolism,10-19%.   Original Report Authenticated By: Lorriane Shire, M.D.     Microbiology: Recent Results (from the past 240 hour(s))  CULTURE, BLOOD (ROUTINE X 2)     Status: None   Collection Time    03/26/13 10:00 PM      Result Value Range Status   Specimen Description BLOOD RIGHT HAND   Final   Special Requests BOTTLES DRAWN AEROBIC ONLY 5CC   Final   Culture  Setup Time 03/27/2013 07:18   Final   Culture      Final   Value:        BLOOD CULTURE RECEIVED NO GROWTH TO DATE CULTURE WILL BE HELD FOR 5 DAYS BEFORE ISSUING A FINAL NEGATIVE REPORT   Report Status PENDING   Incomplete  CULTURE, BLOOD (ROUTINE X 2)     Status: None   Collection Time    03/26/13 10:10 PM      Result Value Range Status   Specimen Description BLOOD LEFT HAND   Final   Special Requests BOTTLES DRAWN AEROBIC ONLY 5CC   Final   Culture  Setup Time 03/27/2013 07:18   Final   Culture     Final   Value:        BLOOD CULTURE RECEIVED NO GROWTH TO DATE CULTURE WILL BE HELD FOR 5 DAYS BEFORE ISSUING A FINAL NEGATIVE REPORT   Report Status PENDING   Incomplete  MRSA PCR SCREENING     Status: None   Collection Time    03/26/13 10:53 PM      Result Value Range Status   MRSA by PCR NEGATIVE  NEGATIVE Final   Comment:            The GeneXpert MRSA Assay (FDA     approved for NASAL specimens     only), is one component of a     comprehensive MRSA colonization     surveillance program. It is not     intended to diagnose MRSA     infection nor to guide or     monitor treatment for     MRSA infections.     Labs: Basic Metabolic Panel:  Recent Labs Lab 03/26/13 1420 03/27/13 1032 03/28/13 0730 03/29/13 0437 03/31/13 0752  NA 137 136 135 133* 136  K 3.3* 4.0 4.3 3.9 3.7  CL 97 97 95* 94* 97  CO2 30 29 26 29 26   GLUCOSE 108* 73 113* 89 97  BUN 12 21 30* 15 33*  CREATININE 4.24* 6.10* 7.73* 5.72* 9.79*  CALCIUM 9.6 9.0 9.2 9.0 9.4  PHOS  --   --  5.8*  --  5.2*   Liver Function Tests:  Recent Labs Lab 03/27/13 1032 03/28/13 0730 03/31/13 0752  AST 15  --   --   ALT 13  --   --   ALKPHOS 129*  --   --   BILITOT 0.4  --   --   PROT 7.8  --   --   ALBUMIN 3.3* 3.8 3.2*   No results found for this basename: LIPASE, AMYLASE,  in the last 168 hours No results found for this basename: AMMONIA,  in the last 168 hours CBC:  Recent Labs Lab 03/26/13 1420 03/27/13 1032 03/28/13 0730 03/29/13 0437  03/30/13 0622 03/31/13 0752  WBC 6.9 5.7 7.9 6.8 7.1 6.3  NEUTROABS 5.8  --   --   --   --   --   HGB 11.9* 10.7* 11.0* 11.2* 10.8* 10.0*  HCT 36.3 34.5* 35.3* 36.3 35.0* 31.2*  MCV 90.3 92.5 93.1 94.8 93.8 91.2  PLT 171 168 167 189 177 165   Cardiac Enzymes: No results found for this basename: CKTOTAL, CKMB, CKMBINDEX, TROPONINI,  in the last 168 hours BNP: BNP (last 3 results) No results found for this basename: PROBNP,  in the last 8760 hours CBG:  Recent Labs Lab 03/30/13 1211 03/30/13 1634 03/30/13 2117 03/31/13 1256 03/31/13 1340  GLUCAP 89 78 107* 53* 60*       Signed:  Jerusalen Mateja  [facility] 03/31/2013, 1:54 PM

## 2013-04-02 LAB — CULTURE, BLOOD (ROUTINE X 2): Culture: NO GROWTH

## 2013-04-02 NOTE — ED Provider Notes (Signed)
Medical screening examination/treatment/procedure(s) were performed by non-physician practitioner and as supervising physician I was immediately available for consultation/collaboration.  Virgel Manifold, MD 04/02/13 [phone]

## 2013-04-09 DIAGNOSIS — E892 Postprocedural hypoparathyroidism: Secondary | ICD-10-CM | POA: Insufficient documentation

## 2013-05-15 IMAGING — XA IR SHUNTOGRAM/ FISTULAGRAM
1 series · 13 of 19 positions shown · non-contrast
Comparison: None.

CLINICAL DATA: End-stage renal disease, decreased flows on dialysis

IR LEFT SHUNTOGRAM/FISTULAGRAM

[Series 1: run · 13 of 19 slices shown]
[im 1/19]
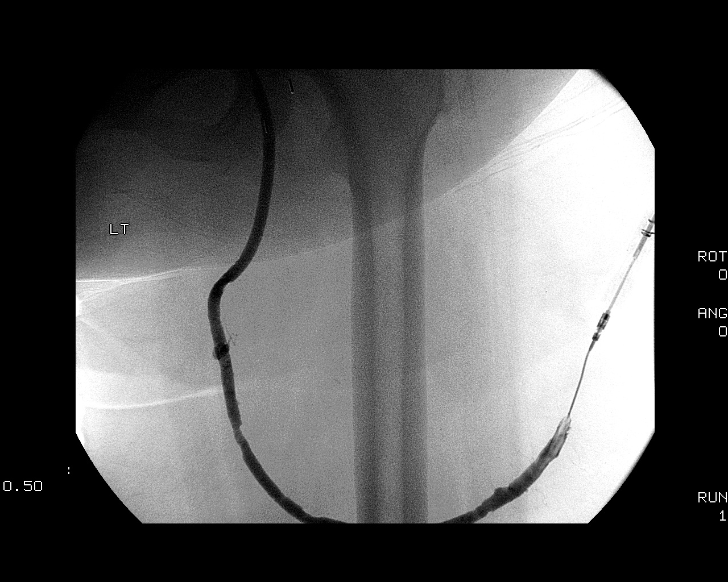
[im 3/19]
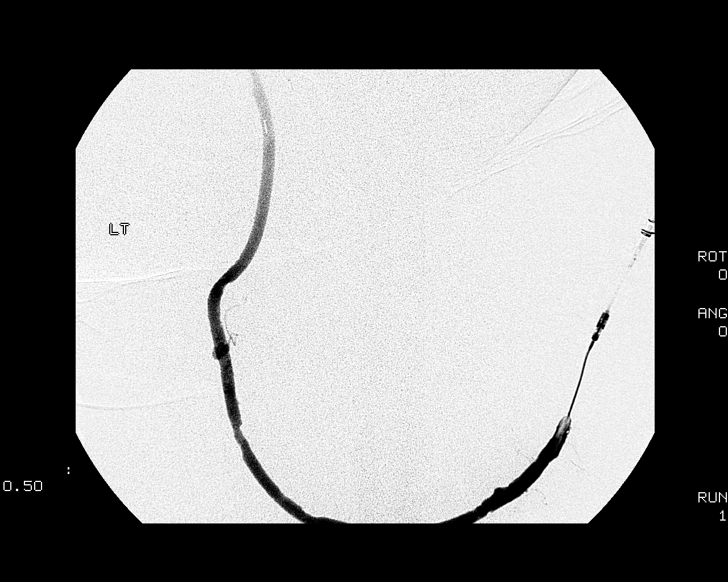
[im 4/19]
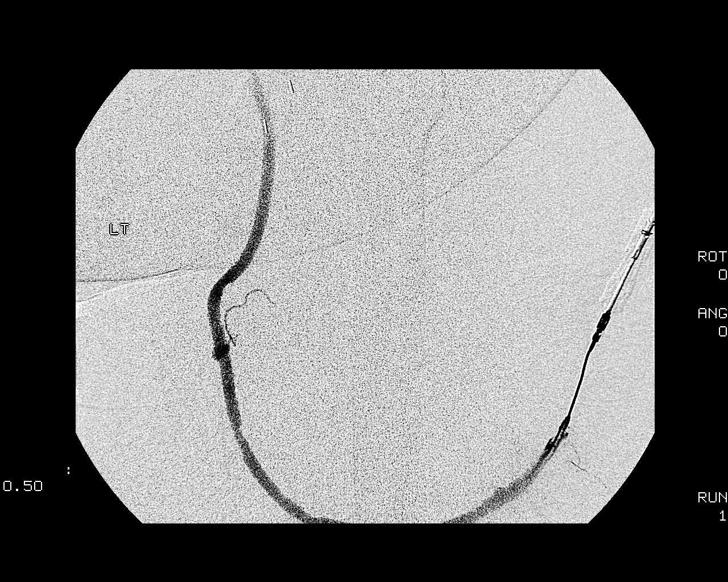
[im 6/19]
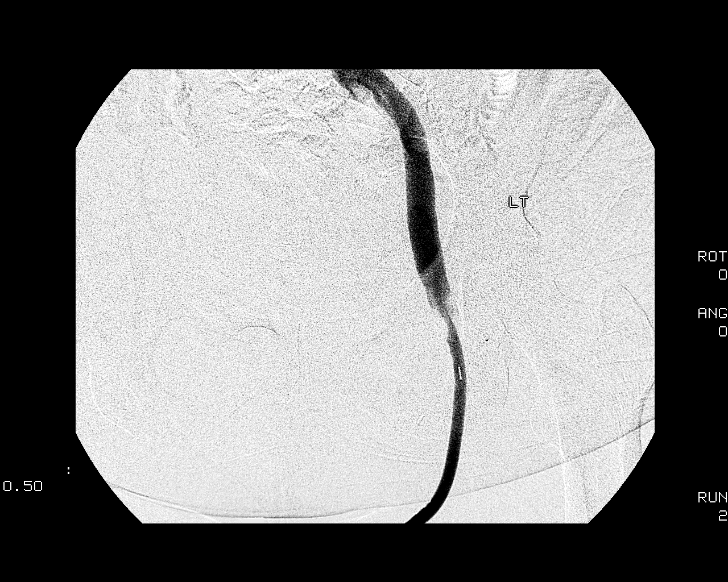
[im 7/19]
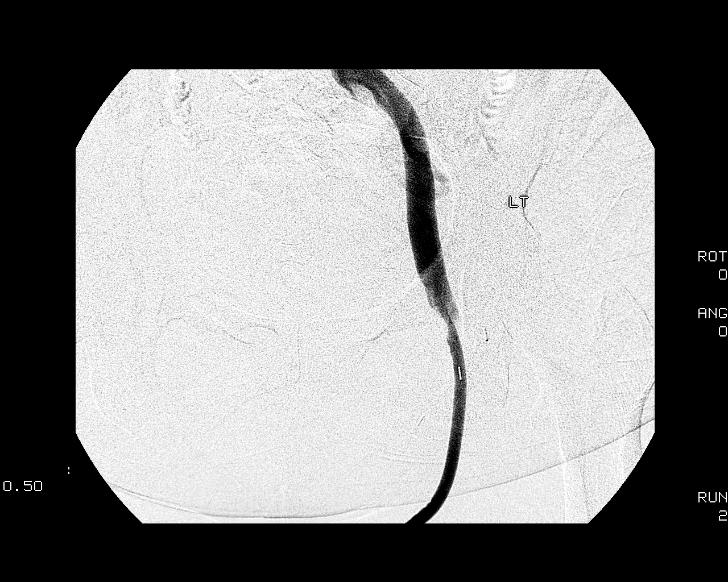
[im 9/19]
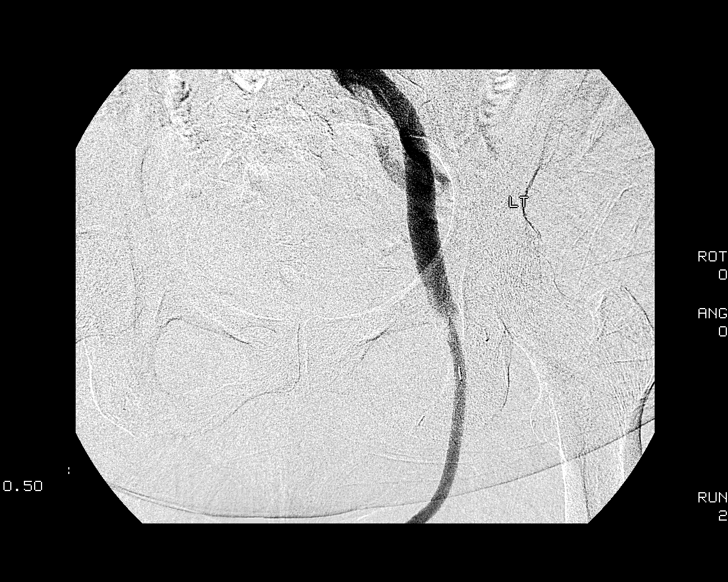
[im 10/19]
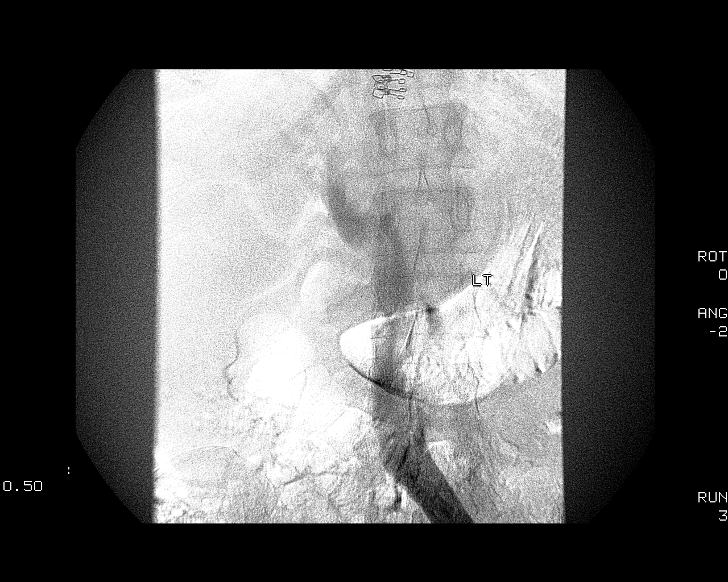
[im 11/19]
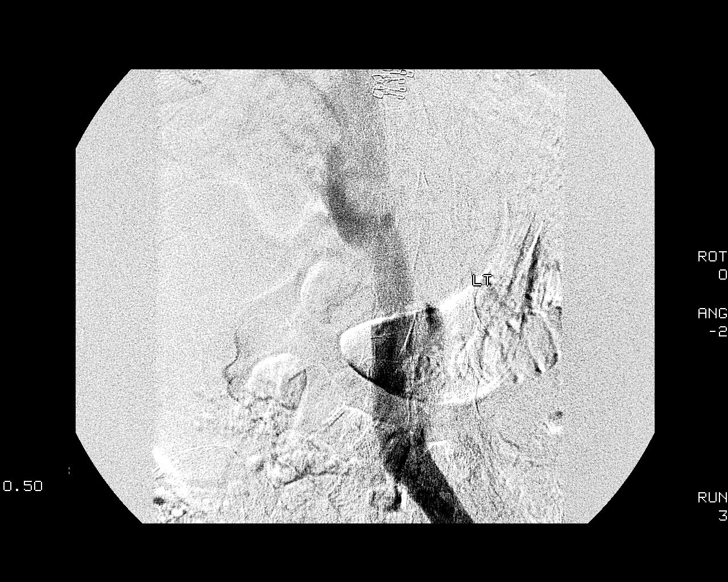
[im 13/19]
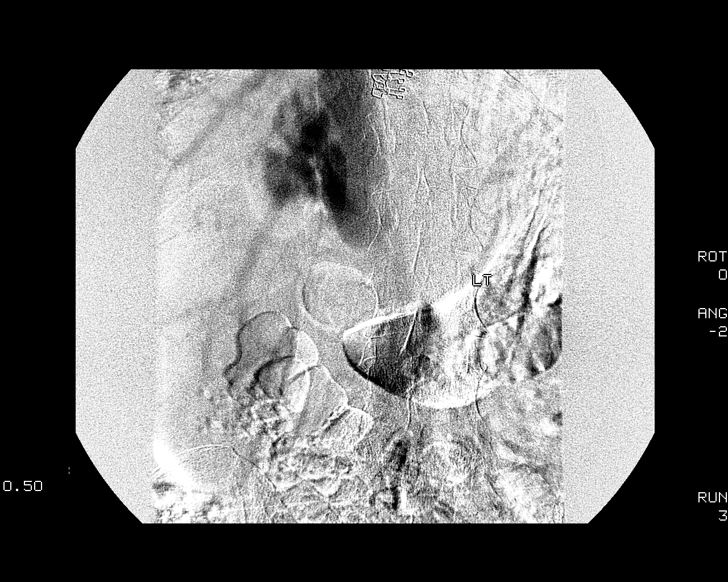
[im 14/19]
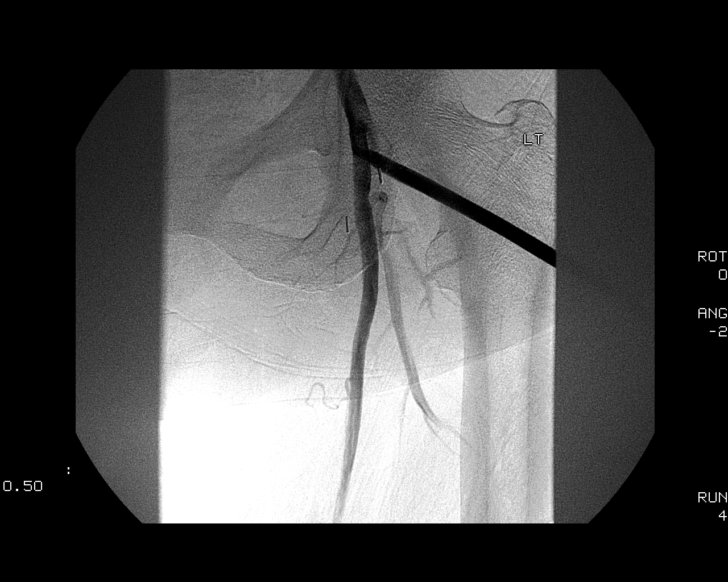
[im 16/19]
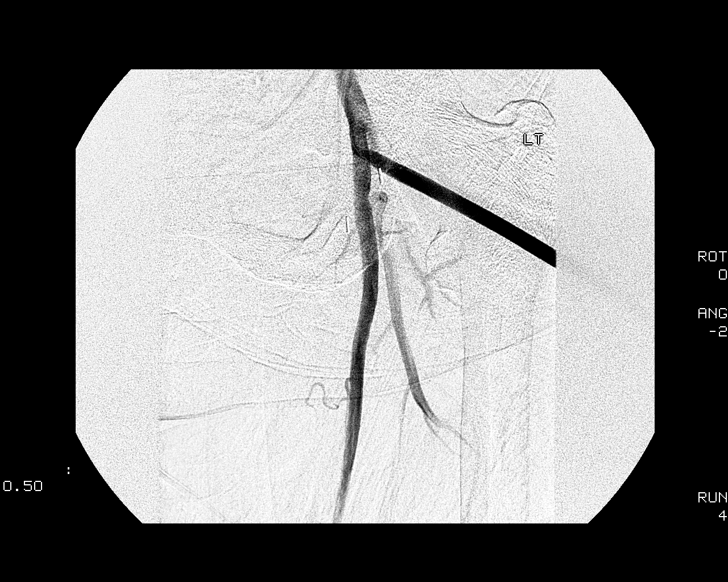
[im 17/19]
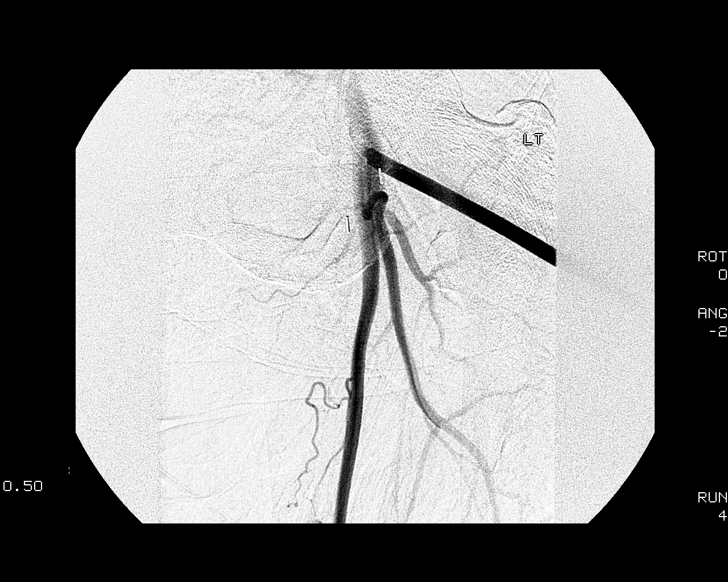
[im 19/19]
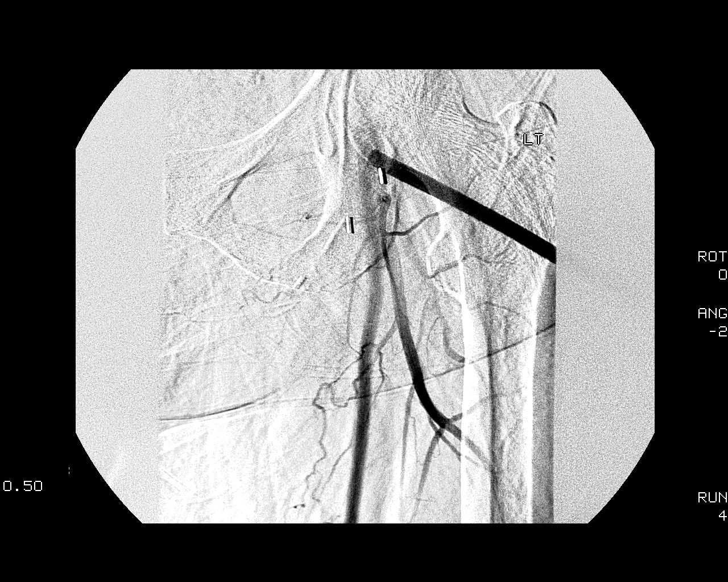

[13 of 19 positions shown; findings below may reference images not displayed]

FINDINGS: An 18 gauge angiocatheter was placed in the arterial limb
of the left thigh synthetic loop graft for fistulography.  There is
mild irregularity of the graft without intragraft stenosis.  Mild
tapered narrowing of the venous anastomosis.  The native outflow
venous system through the IVC is widely patent.  Induced reflux
across the arterial anastomosis demonstrates this to be widely
patent.
IMPRESSION: 1.  No significant stenosis or occlusion.

## 2013-10-31 DIAGNOSIS — R0602 Shortness of breath: Secondary | ICD-10-CM | POA: Insufficient documentation

## 2013-11-02 DIAGNOSIS — L299 Pruritus, unspecified: Secondary | ICD-10-CM | POA: Insufficient documentation

## 2014-03-25 ENCOUNTER — Inpatient Hospital Stay (HOSPITAL_COMMUNITY)
Admission: EM | Admit: 2014-03-25 | Discharge: 2014-03-27 | DRG: 190 | Disposition: A | Discharge status: Home / Self Care | Payer: Medicare Other | Attending: Internal Medicine | Admitting: Internal Medicine

## 2014-03-25 ENCOUNTER — Emergency Department (HOSPITAL_COMMUNITY): Payer: Medicare Other

## 2014-03-25 ENCOUNTER — Encounter (HOSPITAL_COMMUNITY): Payer: Self-pay | Admitting: Emergency Medicine

## 2014-03-25 DIAGNOSIS — T380X5A Adverse effect of glucocorticoids and synthetic analogues, initial encounter: Secondary | ICD-10-CM | POA: Diagnosis not present

## 2014-03-25 DIAGNOSIS — J441 Chronic obstructive pulmonary disease with (acute) exacerbation: Principal | ICD-10-CM | POA: Diagnosis present

## 2014-03-25 DIAGNOSIS — R059 Cough, unspecified: Secondary | ICD-10-CM | POA: Diagnosis present

## 2014-03-25 DIAGNOSIS — R509 Fever, unspecified: Secondary | ICD-10-CM

## 2014-03-25 DIAGNOSIS — J309 Allergic rhinitis, unspecified: Secondary | ICD-10-CM

## 2014-03-25 DIAGNOSIS — R05 Cough: Secondary | ICD-10-CM

## 2014-03-25 DIAGNOSIS — D72829 Elevated white blood cell count, unspecified: Secondary | ICD-10-CM | POA: Diagnosis not present

## 2014-03-25 DIAGNOSIS — Z6841 Body Mass Index (BMI) 40.0 and over, adult: Secondary | ICD-10-CM

## 2014-03-25 DIAGNOSIS — R197 Diarrhea, unspecified: Secondary | ICD-10-CM

## 2014-03-25 DIAGNOSIS — Z992 Dependence on renal dialysis: Secondary | ICD-10-CM

## 2014-03-25 DIAGNOSIS — D638 Anemia in other chronic diseases classified elsewhere: Secondary | ICD-10-CM | POA: Diagnosis present

## 2014-03-25 DIAGNOSIS — I120 Hypertensive chronic kidney disease with stage 5 chronic kidney disease or end stage renal disease: Secondary | ICD-10-CM | POA: Diagnosis present

## 2014-03-25 DIAGNOSIS — G4733 Obstructive sleep apnea (adult) (pediatric): Secondary | ICD-10-CM | POA: Diagnosis present

## 2014-03-25 DIAGNOSIS — M949 Disorder of cartilage, unspecified: Secondary | ICD-10-CM

## 2014-03-25 DIAGNOSIS — M899 Disorder of bone, unspecified: Secondary | ICD-10-CM | POA: Diagnosis present

## 2014-03-25 DIAGNOSIS — N2581 Secondary hyperparathyroidism of renal origin: Secondary | ICD-10-CM | POA: Diagnosis present

## 2014-03-25 DIAGNOSIS — N186 End stage renal disease: Secondary | ICD-10-CM | POA: Diagnosis present

## 2014-03-25 DIAGNOSIS — Z794 Long term (current) use of insulin: Secondary | ICD-10-CM

## 2014-03-25 DIAGNOSIS — H1010 Acute atopic conjunctivitis, unspecified eye: Secondary | ICD-10-CM

## 2014-03-25 DIAGNOSIS — K219 Gastro-esophageal reflux disease without esophagitis: Secondary | ICD-10-CM | POA: Diagnosis present

## 2014-03-25 DIAGNOSIS — Z86711 Personal history of pulmonary embolism: Secondary | ICD-10-CM

## 2014-03-25 DIAGNOSIS — E119 Type 2 diabetes mellitus without complications: Secondary | ICD-10-CM | POA: Diagnosis present

## 2014-03-25 HISTORY — DX: Other hypotension: I95.89

## 2014-03-25 HISTORY — DX: Chronic obstructive pulmonary disease, unspecified: J44.9

## 2014-03-25 LAB — CBC WITH DIFFERENTIAL/PLATELET
BASOS ABS: 0 10*3/uL (ref 0.0–0.1)
BASOS PCT: 0 % (ref 0–1)
Eosinophils Absolute: 0.2 10*3/uL (ref 0.0–0.7)
Eosinophils Relative: 3 % (ref 0–5)
HEMATOCRIT: 34.6 % — AB (ref 36.0–46.0)
HEMOGLOBIN: 11.3 g/dL — AB (ref 12.0–15.0)
LYMPHS PCT: 12 % (ref 12–46)
Lymphs Abs: 0.9 10*3/uL (ref 0.7–4.0)
MCH: 31.5 pg (ref 26.0–34.0)
MCHC: 32.7 g/dL (ref 30.0–36.0)
MCV: 96.4 fL (ref 78.0–100.0)
MONO ABS: 0.5 10*3/uL (ref 0.1–1.0)
MONOS PCT: 7 % (ref 3–12)
NEUTROS ABS: 5.7 10*3/uL (ref 1.7–7.7)
NEUTROS PCT: 78 % — AB (ref 43–77)
Platelets: 189 10*3/uL (ref 150–400)
RBC: 3.59 MIL/uL — ABNORMAL LOW (ref 3.87–5.11)
RDW: 13.2 % (ref 11.5–15.5)
WBC: 7.3 10*3/uL (ref 4.0–10.5)

## 2014-03-25 LAB — BASIC METABOLIC PANEL WITH GFR
BUN: 16 mg/dL (ref 6–23)
CO2: 30 meq/L (ref 19–32)
Calcium: 8.9 mg/dL (ref 8.4–10.5)
Chloride: 94 meq/L — ABNORMAL LOW (ref 96–112)
Creatinine, Ser: 3.25 mg/dL — ABNORMAL HIGH (ref 0.50–1.10)
GFR calc Af Amer: 18 mL/min — ABNORMAL LOW
GFR calc non Af Amer: 15 mL/min — ABNORMAL LOW
Glucose, Bld: 126 mg/dL — ABNORMAL HIGH (ref 70–99)
Potassium: 3.7 meq/L (ref 3.7–5.3)
Sodium: 142 meq/L (ref 137–147)

## 2014-03-25 LAB — I-STAT TROPONIN, ED: Troponin i, poc: 0 ng/mL (ref 0.00–0.08)

## 2014-03-25 LAB — GLUCOSE, CAPILLARY
GLUCOSE-CAPILLARY: 310 mg/dL — AB (ref 70–99)
Glucose-Capillary: 105 mg/dL — ABNORMAL HIGH (ref 70–99)

## 2014-03-25 MED ORDER — METHYLPREDNISOLONE SODIUM SUCC 125 MG IJ SOLR
60.0000 mg | Freq: Three times a day (TID) | INTRAMUSCULAR | Status: DC
  Administered 2014-03-26 (×3): 60 mg via INTRAVENOUS
  Filled 2014-03-25 (×5): qty 0.96

## 2014-03-25 MED ORDER — TECHNETIUM TO 99M ALBUMIN AGGREGATED
6.0000 | Freq: Once | INTRAVENOUS | Status: AC | PRN
  Administered 2014-03-25: 6 via INTRAVENOUS

## 2014-03-25 MED ORDER — ALBUTEROL SULFATE (2.5 MG/3ML) 0.083% IN NEBU
2.5000 mg | INHALATION_SOLUTION | Freq: Once | RESPIRATORY_TRACT | Status: AC
  Administered 2014-03-25: 2.5 mg via RESPIRATORY_TRACT
  Filled 2014-03-25: qty 3

## 2014-03-25 MED ORDER — TECHNETIUM TC 99M DIETHYLENETRIAME-PENTAACETIC ACID: 40.0000 | Freq: Once | INTRAVENOUS | Status: AC | PRN

## 2014-03-25 MED ORDER — LEVOFLOXACIN IN D5W 750 MG/150ML IV SOLN
750.0000 mg | INTRAVENOUS | Status: DC
  Filled 2014-03-25: qty 150

## 2014-03-25 MED ORDER — ALBUTEROL SULFATE (2.5 MG/3ML) 0.083% IN NEBU: 2.5000 mg | INHALATION_SOLUTION | RESPIRATORY_TRACT | Status: AC

## 2014-03-25 MED ORDER — ALBUTEROL (5 MG/ML) CONTINUOUS INHALATION SOLN
10.0000 mg/h | INHALATION_SOLUTION | Freq: Once | RESPIRATORY_TRACT | Status: AC
  Administered 2014-03-25: 10 mg/h via RESPIRATORY_TRACT
  Filled 2014-03-25: qty 20

## 2014-03-25 MED ORDER — MIDODRINE HCL 5 MG PO TABS
5.0000 mg | ORAL_TABLET | Freq: Two times a day (BID) | ORAL | Status: DC
  Administered 2014-03-25 – 2014-03-27 (×4): 5 mg via ORAL
  Filled 2014-03-25 (×5): qty 1

## 2014-03-25 MED ORDER — LEVOFLOXACIN IN D5W 500 MG/100ML IV SOLN: 500.0000 mg | INTRAVENOUS | Status: DC

## 2014-03-25 MED ORDER — IPRATROPIUM-ALBUTEROL 0.5-2.5 (3) MG/3ML IN SOLN
3.0000 mL | Freq: Once | RESPIRATORY_TRACT | Status: AC
  Administered 2014-03-25: 3 mL via RESPIRATORY_TRACT
  Filled 2014-03-25: qty 3

## 2014-03-25 MED ORDER — INSULIN ASPART 100 UNIT/ML ~~LOC~~ SOLN
0.0000 [IU] | Freq: Three times a day (TID) | SUBCUTANEOUS | Status: DC
  Administered 2014-03-26 (×3): 2 [IU] via SUBCUTANEOUS
  Administered 2014-03-27: 1 [IU] via SUBCUTANEOUS

## 2014-03-25 MED ORDER — METOCLOPRAMIDE HCL 5 MG PO TABS
5.0000 mg | ORAL_TABLET | Freq: Every day | ORAL | Status: DC
  Administered 2014-03-25 – 2014-03-26 (×2): 5 mg via ORAL
  Filled 2014-03-25 (×3): qty 1

## 2014-03-25 MED ORDER — INSULIN ASPART 100 UNIT/ML ~~LOC~~ SOLN
0.0000 [IU] | Freq: Every day | SUBCUTANEOUS | Status: DC
  Administered 2014-03-25: 4 [IU] via SUBCUTANEOUS

## 2014-03-25 MED ORDER — CALCIUM ACETATE 667 MG PO CAPS
1334.0000 mg | ORAL_CAPSULE | Freq: Three times a day (TID) | ORAL | Status: DC
  Administered 2014-03-26 – 2014-03-27 (×5): 1334 mg via ORAL
  Filled 2014-03-25 (×7): qty 2

## 2014-03-25 MED ORDER — INSULIN ASPART 100 UNIT/ML ~~LOC~~ SOLN
12.0000 [IU] | Freq: Three times a day (TID) | SUBCUTANEOUS | Status: DC
  Administered 2014-03-26 – 2014-03-27 (×5): 12 [IU] via SUBCUTANEOUS

## 2014-03-25 MED ORDER — IPRATROPIUM BROMIDE 0.02 % IN SOLN
1.0000 mg | Freq: Once | RESPIRATORY_TRACT | Status: AC
Start: 2014-03-25 — End: 2014-03-25
  Administered 2014-03-25: 1 mg via RESPIRATORY_TRACT
  Filled 2014-03-25: qty 5

## 2014-03-25 MED ORDER — ALBUTEROL SULFATE (2.5 MG/3ML) 0.083% IN NEBU
5.0000 mg | INHALATION_SOLUTION | Freq: Once | RESPIRATORY_TRACT | Status: AC
  Administered 2014-03-25: 5 mg via RESPIRATORY_TRACT
  Filled 2014-03-25: qty 6

## 2014-03-25 MED ORDER — INSULIN GLARGINE 100 UNIT/ML ~~LOC~~ SOLN
15.0000 [IU] | Freq: Every day | SUBCUTANEOUS | Status: DC
  Administered 2014-03-25 – 2014-03-26 (×2): 15 [IU] via SUBCUTANEOUS
  Filled 2014-03-25 (×3): qty 0.15

## 2014-03-25 MED ORDER — IPRATROPIUM-ALBUTEROL 0.5-2.5 (3) MG/3ML IN SOLN
3.0000 mL | Freq: Four times a day (QID) | RESPIRATORY_TRACT | Status: DC
  Administered 2014-03-26: 3 mL via RESPIRATORY_TRACT
  Filled 2014-03-25: qty 3

## 2014-03-25 MED ORDER — METHYLPREDNISOLONE SODIUM SUCC 125 MG IJ SOLR
125.0000 mg | Freq: Once | INTRAMUSCULAR | Status: AC
  Administered 2014-03-25: 125 mg via INTRAVENOUS
  Filled 2014-03-25: qty 2

## 2014-03-25 MED ORDER — LEVOFLOXACIN IN D5W 500 MG/100ML IV SOLN
500.0000 mg | INTRAVENOUS | Status: DC
  Administered 2014-03-25: 500 mg via INTRAVENOUS
  Filled 2014-03-25: qty 100

## 2014-03-25 MED ORDER — ONDANSETRON HCL 4 MG/2ML IJ SOLN: 4.0000 mg | Freq: Four times a day (QID) | INTRAMUSCULAR | Status: DC | PRN

## 2014-03-25 MED ORDER — ONDANSETRON HCL 4 MG PO TABS: 4.0000 mg | ORAL_TABLET | Freq: Four times a day (QID) | ORAL | Status: DC | PRN

## 2014-03-25 MED ORDER — IPRATROPIUM-ALBUTEROL 0.5-2.5 (3) MG/3ML IN SOLN
3.0000 mL | RESPIRATORY_TRACT | Status: DC
  Administered 2014-03-25: 3 mL via RESPIRATORY_TRACT
  Filled 2014-03-25 (×2): qty 3

## 2014-03-25 NOTE — ED Notes (Signed)
Patient gone to xray- unable to draw labs at this time.

## 2014-03-25 NOTE — ED Notes (Signed)
Patient having breathing treatment.  Unable to draw labs at this time.

## 2014-03-25 NOTE — ED Notes (Signed)
Pt is dialysis patient, just finished dialysis.

## 2014-03-25 NOTE — Progress Notes (Signed)
ANTICOAGULATION CONSULT NOTE - Initial Consult  Pharmacy Consult for Lovenox Indication: History of PE  Allergies  Allergen Reactions  . Amoxicillin Anaphylaxis    Throat closes and gets sweaty.  . Iohexol      Desc: scratchy/itchy throat/itching on back / nausea     Patient Measurements: Height: 5\' 2"  (157.5 cm) Weight: 281 lb 1.6 oz (127.506 kg) IBW/kg (Calculated) : 50.1 Heparin Dosing Weight:   Vital Signs: Temp: 98.1 F (36.7 C) (04/16 2116) Temp src: Oral (04/16 1739) BP: 104/76 mmHg (04/16 2116) Pulse Rate: 77 (04/16 2116)  Labs:  Recent Labs  03/25/14 1159  HGB 11.3*  HCT 34.6*  PLT 189  CREATININE 3.25*    Estimated Creatinine Clearance: 25.6 ml/min (by C-G formula based on Cr of 3.25).   Medical History: Past Medical History  Diagnosis Date  . HTN (hypertension)   . Obesities, morbid   . Diabetes mellitus type 2 in obese   . GERD (gastroesophageal reflux disease)   . PE (pulmonary embolism) ~ 2010  . Superior vena cava syndrome     [name] 03/26/2013  . ESRD (end stage renal disease) on dialysis     "TTS; [facility] [place]; (03/26/2013)  . Pneumonia     "now & once a long time ago" (03/26/2013)  . Shortness of breath     "just a little bit; at any time" (03/26/2013)  . OSA on CPAP   . Iron deficiency anemia   . Chronic hypotension   . COPD (chronic obstructive pulmonary disease)     Medications:  Scheduled:  . albuterol  2.5 mg Nebulization Q4H  . [START ON 03/26/2014] calcium acetate  1,334 mg Oral TID WC  . insulin aspart  0-5 Units Subcutaneous QHS  . [START ON 03/26/2014] insulin aspart  0-9 Units Subcutaneous TID WC  . [START ON 03/26/2014] insulin aspart  12 Units Subcutaneous TID AC  . insulin glargine  15 Units Subcutaneous QHS  . [START ON 03/26/2014] ipratropium-albuterol  3 mL Nebulization QID  . [START ON 03/27/2014] levofloxacin (LEVAQUIN) IV  750 mg Intravenous Q48H  . [START ON 03/26/2014] methylPREDNISolone (SOLU-MEDROL) injection  60 mg  Intravenous 3 times per day  . metoCLOPramide  5 mg Oral QHS  . midodrine  5 mg Oral BID    Assessment: 53yo female with hx PE, on Lovenox at home.  Pt is unable to provide dosage but does state she injects it once daily and has had today's dose.  We have attempted to verify dosage with [facility], but they are closed.  We will f/u in AM.  V/Q scan done on admission has low probability of PE.  Goal of Therapy:  prevention of VTE Monitor platelets by anticoagulation protocol: Yes   Plan:  1-  Call [facility] in AM to clarify dosage 2-  Order Lovenox once info is obtained and add to Med Rec  [name], [facility]

## 2014-03-25 NOTE — ED Notes (Signed)
Family at bedside. 

## 2014-03-25 NOTE — ED Notes (Signed)
Pt reporting SOB since yesterday, also cough, watery eyes. Denies CP. No fever. Pt is a x 4. 95% RA.

## 2014-03-25 NOTE — H&P (Signed)
[facility] History and Physical  [name] [id number] DOB: Aug 13, 1961 DOA: 03/25/2014  Referring physician: EDP PCP: Barbette Merino, MD   Chief Complaint: sob since last  Night.   HPI: [name] is a 53 y.o. female with prior h/o hypertension, type 2 DM, PE, GERD, ESRD on HD on TTS, anemia,  Came in after her HD treatment for worsening sob with cough. She reports having sob since last night assocaited with cough , non productive in nature. She denies fever or chills. She denies nausea or vomiting, diarrhea . She denies orthonea or PND. She denies chest pain or palpitations. On arrival to ED, she was found to be hypoxic and was put on [place] oxygen. She was given neb treatments and her breathing improved. Her CXR did not reveal any consolidation, has mild interstitial edema. It was followed up with NM scan for evaluation of PE, . Asp er the patient she is on lovenox at home for PE.   Review of Systems:  See hpi other wise negative  Past Medical History  Diagnosis Date  . HTN (hypertension)   . Obesities, morbid   . Diabetes mellitus type 2 in obese   . GERD (gastroesophageal reflux disease)   . PE (pulmonary embolism) ~ 2010  . Superior vena cava syndrome     Archie Endo 03/26/2013  . ESRD (end stage renal disease) on dialysis     "TTS; [place]; (03/26/2013)  . Pneumonia     "now & once a long time ago" (03/26/2013)  . Shortness of breath     "just a little bit; at any time" (03/26/2013)  . OSA on CPAP   . Iron deficiency anemia   . Chronic hypotension   . COPD (chronic obstructive pulmonary disease)    Past Surgical History  Procedure Laterality Date  . Vascular surgery    . Thrombectomy and revision of arterioventous (av) goretex  graft Left ~ 12/2012    "thigh" (03/26/2013)  . Arteriovenous graft placement Left ~ 2011    "thigh" (03/26/2013)  . Carpal tunnel release Right ~ 2011   Social History:  reports that she has never smoked. She has never used smokeless  tobacco. She reports that she does not drink alcohol or use illicit drugs.  Allergies  Allergen Reactions  . Amoxicillin Anaphylaxis    Throat closes and gets sweaty.  . Iohexol      Desc: scratchy/itchy throat/itching on back / nausea     Family History  Problem Relation Age of Onset  . Diabetes Mellitus II Mother   . Hypertension Mother   . Hypertension Father   . Diabetes Mellitus II Father      Prior to Admission medications   Medication Sig Start Date End Date Taking? Authorizing Provider  albuterol (PROVENTIL HFA;VENTOLIN HFA) 108 (90 BASE) MCG/ACT inhaler Inhale 2 puffs into the lungs every 6 (six) hours as needed. For shortness of breath.   Yes Historical Provider, MD  calcium acetate (PHOSLO) 667 MG capsule Take 1,334 mg by mouth 3 (three) times daily with meals.    Yes Historical Provider, MD  insulin aspart (NOVOLOG FLEXPEN) 100 UNIT/ML injection Inject 12 Units into the skin 3 (three) times daily before meals.   Yes Historical Provider, MD  insulin glargine (LANTUS) 100 UNIT/ML injection Inject 0.15 mLs (15 Units total) into the skin at bedtime. 03/31/13  Yes Geradine Girt, DO  metoCLOPramide (REGLAN) 5 MG tablet Take 5 mg by mouth at bedtime.  Yes Historical Provider, MD  midodrine (PROAMATINE) 5 MG tablet Take 5 mg by mouth 2 (two) times daily.     Yes Historical Provider, MD  Multiple Vitamins-Minerals (MULTIVITAMINS THER. W/MINERALS) TABS Take 1 tablet by mouth daily.     Yes Historical Provider, MD   Physical Exam: Filed Vitals:   03/25/14 1739  BP: 105/53  Pulse: 96  Temp: 98.8 F (37.1 C)  Resp: 24    BP 105/53  Pulse 96  Temp(Src) 98.8 F (37.1 C) (Oral)  Resp 24  Ht 5\' 2"  (1.575 m)  Wt 127.597 kg (281 lb 4.8 oz)  BMI 51.44 kg/m2  SpO2 94%  General:  Appears restless and wheezing  Eyes: PERRL, normal lids, irises & conjunctiva Neck: no LAD, masses or thyromegaly Cardiovascular: RRR, no m/r/g. No LE edema. Respiratory: bilateral  Expiratory  wheezing heard Abdomen: soft, ntnd Skin: no rash or induration seen on limited exam Musculoskeletal: trace edema.  Psychiatric: grossly normal mood and affect, speech fluent and appropriate Neurologic: grossly non-focal.          Labs on Admission:  Basic Metabolic Panel:  Recent Labs Lab 03/25/14 1159  NA 142  K 3.7  CL 94*  CO2 30  GLUCOSE 126*  BUN 16  CREATININE 3.25*  CALCIUM 8.9   Liver Function Tests: No results found for this basename: AST, ALT, ALKPHOS, BILITOT, PROT, ALBUMIN,  in the last 168 hours No results found for this basename: LIPASE, AMYLASE,  in the last 168 hours No results found for this basename: AMMONIA,  in the last 168 hours CBC:  Recent Labs Lab 03/25/14 1159  WBC 7.3  NEUTROABS 5.7  HGB 11.3*  HCT 34.6*  MCV 96.4  PLT 189   Cardiac Enzymes: No results found for this basename: CKTOTAL, CKMB, CKMBINDEX, TROPONINI,  in the last 168 hours  BNP (last 3 results) No results found for this basename: PROBNP,  in the last 8760 hours CBG:  Recent Labs Lab 03/25/14 1736  GLUCAP 105*    Radiological Exams on Admission: Dg Chest 2 View  03/25/2014   CLINICAL DATA:  Shortness of breath and cough  EXAM: CHEST  2 VIEW  COMPARISON:  DG CHEST 2 VIEW dated 03/30/2013  FINDINGS: The lungs are well-expanded. There is mild hemidiaphragm flattening on the lateral film. The cardiopericardial silhouette is normal in size. The central pulmonary vascularity is prominent. The interstitial markings are minimally prominent though stable. There is no alveolar or pneumonia. There is no pleural effusion. There is mild tortuosity of the descending thoracic aorta.  IMPRESSION: Mild central pulmonary vascular prominence may reflect low-grade CHF. There is no significant pulmonary interstitial edema nor evidence of pleural effusion.   Electronically Signed   By: David  Martinique   On: 03/25/2014 12:41   Nm Pulmonary Perf And Vent  03/25/2014   CLINICAL DATA:  Shortness of  Breath  EXAM: NUCLEAR MEDICINE VENTILATION - PERFUSION LUNG SCAN  Views: Anterior, posterior, left lateral, right lateral, RPO, LPO, RAO, LAO -ventilation and perfusion  Radionuclide: Technetium 62m DTPA -ventilation; Technetium 13m macroaggregated albumin-perfusion  Dose:  40.0 mCi-ventilation; 6.0 mCi-perfusion  Route of administration: Inhalation-ventilation; intravenous -perfusion  COMPARISON:  Chest radiograph March 25, 2014; ventilation and perfusion lung scan March 30, 2013  FINDINGS: There is stable ventilation compared to the previous study. There are a few subsegmental defects, but there is no segmental ventilation abnormality.  On the perfusion study, there are a few stable subsegmental defects compared to the previous study.  There is no segmental perfusion defect, and there is no significant ventilation/perfusion mismatch.  IMPRESSION: There are a few subsegmental ventilation and perfusion defects, stable. There is no appreciable ventilation/ perfusion mismatch. These findings constitute a low probability of pulmonary embolus.   Electronically Signed   By: Lowella Grip M.D.   On: 03/25/2014 15:28    EKG: NSR  Assessment/Plan Active Problems:   Cough   ESRD on hemodialysis   OSA (obstructive sleep apnea)   Anemia of chronic disease   Insulin-requiring or dependent type II diabetes mellitus   COPD with acute exacerbation   COPD exacerbation   1. COPD exacerbation:  - admitted to med surg.  - nasal [place] oxygen to keep sats>90%.  - bronchodilators  Every 4 hours  - solumedrol 60 mg every 8 hours.  - levaquin.   2. Diabetes mellitus: - SSI.  -  CBG (last 3)   Recent Labs  03/25/14 1736  GLUCAP 105*    Resume lantus and SSI.   3. ESRD on HD; - please call nephrology in am to schedule HD for TTS.  -  Resume home medications.    4. Anemia of chronic disease:  - appears to be stable. Continue to monitor.   5. Obstructive sleep apnea: CPAP at night.   5. DVT  prophylaxis.   Code Status: full code Family Communication: discussed with husband and daughter at bedside.  Disposition Plan: admit to medsurg.  Time spent: 65 minutes.   Hosie Poisson [facility] Pager [phone].

## 2014-03-25 NOTE — ED Provider Notes (Signed)
CSN: [id number]     Arrival date & time 03/25/14  1142 History   First MD Initiated Contact with Patient 03/25/14 1247     Chief Complaint  Patient presents with  . Shortness of Breath  . Cough  . Nasal Congestion      HPI Pt was seen at 1310.  Per pt and her family, c/o gradual onset and worsening of persistent cough, wheezing and SOB for the past 2 days. Has been associated with runny/stuffy nose, sinus congestion, and "itchy, watery eyes." Pt states she went to her usual HD appointment today. Denies eyes pain/injury, no visual changes, no fevers, no back pain, no CP/palpitations, no N/V/D, no abd pain.      Past Medical History  Diagnosis Date  . HTN (hypertension)   . Obesities, morbid   . Diabetes mellitus type 2 in obese   . GERD (gastroesophageal reflux disease)   . PE (pulmonary embolism) ~ 2010  . Superior vena cava syndrome     Archie Endo 03/26/2013  . ESRD (end stage renal disease) on dialysis     "TTS; [place]; (03/26/2013)  . Pneumonia     "now & once a long time ago" (03/26/2013)  . Shortness of breath     "just a little bit; at any time" (03/26/2013)  . OSA on CPAP   . Iron deficiency anemia   . Chronic hypotension   . COPD (chronic obstructive pulmonary disease)    Past Surgical History  Procedure Laterality Date  . Vascular surgery    . Thrombectomy and revision of arterioventous (av) goretex  graft Left ~ 12/2012    "thigh" (03/26/2013)  . Arteriovenous graft placement Left ~ 2011    "thigh" (03/26/2013)  . Carpal tunnel release Right ~ 2011   Family History  Problem Relation Age of Onset  . Diabetes Mellitus II Mother   . Hypertension Mother   . Hypertension Father   . Diabetes Mellitus II Father    History  Substance Use Topics  . Smoking status: Never Smoker   . Smokeless tobacco: Never Used  . Alcohol Use: No    Review of Systems ROS: Statement: All systems negative except as marked or noted in the HPI; Constitutional: Negative for fever and  chills. ; ; Eyes: Negative for eye pain, +redness, itching, and watering. ; ; ENMT: Negative for ear pain, hoarseness, sore throat. +nasal congestion, rhinorrhea, sinus pressure. ; ; Cardiovascular: Negative for chest pain, palpitations, diaphoresis, and peripheral edema. ; ; Respiratory: +cough, wheezing, SOB. Negative for stridor. ; ; Gastrointestinal: Negative for nausea, vomiting, diarrhea, abdominal pain, blood in stool, hematemesis, jaundice and rectal bleeding. . ; ; Genitourinary: Negative for dysuria, flank pain and hematuria. ; ; Musculoskeletal: Negative for back pain and neck pain. Negative for swelling and trauma.; ; Skin: Negative for pruritus, rash, abrasions, blisters, bruising and skin lesion.; ; Neuro: Negative for headache, lightheadedness and neck stiffness. Negative for weakness, altered level of consciousness , altered mental status, extremity weakness, paresthesias, involuntary movement, seizure and syncope.      Allergies  Amoxicillin and Iohexol  Home Medications   Prior to Admission medications   Medication Sig Start Date End Date Taking? Authorizing Provider  albuterol (PROVENTIL HFA;VENTOLIN HFA) 108 (90 BASE) MCG/ACT inhaler Inhale 2 puffs into the lungs every 6 (six) hours as needed. For shortness of breath.   Yes Historical Provider, MD  calcium acetate (PHOSLO) 667 MG capsule Take 1,334 mg by mouth 3 (three) times daily with meals.  Yes Historical Provider, MD  insulin aspart (NOVOLOG FLEXPEN) 100 UNIT/ML injection Inject 12 Units into the skin 3 (three) times daily before meals.   Yes Historical Provider, MD  insulin glargine (LANTUS) 100 UNIT/ML injection Inject 0.15 mLs (15 Units total) into the skin at bedtime. 03/31/13  Yes Geradine Girt, DO  metoCLOPramide (REGLAN) 5 MG tablet Take 5 mg by mouth at bedtime.     Yes Historical Provider, MD  midodrine (PROAMATINE) 5 MG tablet Take 5 mg by mouth 2 (two) times daily.     Yes Historical Provider, MD  Multiple  Vitamins-Minerals (MULTIVITAMINS THER. W/MINERALS) TABS Take 1 tablet by mouth daily.     Yes Historical Provider, MD   BP 109/70  Pulse 99  Temp(Src) 98.5 F (36.9 C) (Oral)  Resp 21  Wt 281 lb 1.4 oz (127.5 kg)  SpO2 87% Physical Exam 1315: Physical examination:  Nursing notes reviewed; Vital signs and O2 SAT reviewed;  Constitutional: Well developed, Well nourished, Well hydrated, In no acute distress; Head:  Normocephalic, atraumatic; Eyes: EOMI, PERRL, No scleral icterus. Bilat conjunctival injection with clear tearing. No obvious hyphema or hypopyon bilat. No chemosis, no purulent drainage.; ENMT: +edemetous nasal turbinates bilat with clear rhinorrhea. Mouth and pharynx normal, Mucous membranes moist; Neck: Supple, Full range of motion, No lymphadenopathy; Cardiovascular: Regular rate and rhythm, No gallop; Respiratory: Breath sounds diminished & equal bilaterally, faint scattered wheezes. No audible wheezing. Speaking full sentences with ease, Normal respiratory effort/excursion; Chest: Nontender, Movement normal; Abdomen: Soft, Nontender, Nondistended, Normal bowel sounds; Genitourinary: No CVA tenderness; Extremities: Pulses normal, No tenderness, No edema, No calf edema or asymmetry.; Neuro: AA&Ox3, vague historian. Major CN grossly intact.  Speech clear. No gross focal motor or sensory deficits in extremities.; Skin: Color normal, Warm, Dry.    ED Course  Procedures     EKG Interpretation   Date/Time:  Thursday March 25 2014 11:47:34 EDT Ventricular Rate:  94 PR Interval:  194 QRS Duration: 70 QT Interval:  358 QTC Calculation: 447 R Axis:   67 Text Interpretation:  Normal sinus rhythm Low voltage QRS Borderline ECG  When compared with ECG of 05/30/2011 No significant change was found  Confirmed by [facility]  MD, Nunzio Cory [phone]) on 03/25/2014 1:39:25 PM      MDM  MDM Reviewed: previous chart, nursing note and vitals Reviewed previous: labs and ECG Interpretation:  labs, ECG and x-ray Total time providing critical care: 30-74 minutes. This excludes time spent performing separately reportable procedures and services. Consults: admitting MD   CRITICAL CARE Performed by: Alfonzo Feller Total critical care time: 67 Critical care time was exclusive of separately billable procedures and treating other patients. Critical care was necessary to treat or prevent imminent or life-threatening deterioration. Critical care was time spent personally by me on the following activities: development of treatment plan with patient and/or surrogate as well as nursing, discussions with consultants, evaluation of patient's response to treatment, examination of patient, obtaining history from patient or surrogate, ordering and performing treatments and interventions, ordering and review of laboratory studies, ordering and review of radiographic studies, pulse oximetry and re-evaluation of patient's condition.   Results for orders placed during the hospital encounter of 03/25/14  CBC WITH DIFFERENTIAL      Result Value Ref Range   WBC 7.3  4.0 - 10.5 K/uL   RBC 3.59 (*) 3.87 - 5.11 MIL/uL   Hemoglobin 11.3 (*) 12.0 - 15.0 g/dL   HCT 34.6 (*) 36.0 - 46.0 %  MCV 96.4  78.0 - 100.0 fL   MCH 31.5  26.0 - 34.0 pg   MCHC 32.7  30.0 - 36.0 g/dL   RDW 13.2  11.5 - 15.5 %   Platelets 189  150 - 400 K/uL   Neutrophils Relative % 78 (*) 43 - 77 %   Neutro Abs 5.7  1.7 - 7.7 K/uL   Lymphocytes Relative 12  12 - 46 %   Lymphs Abs 0.9  0.7 - 4.0 K/uL   Monocytes Relative 7  3 - 12 %   Monocytes Absolute 0.5  0.1 - 1.0 K/uL   Eosinophils Relative 3  0 - 5 %   Eosinophils Absolute 0.2  0.0 - 0.7 K/uL   Basophils Relative 0  0 - 1 %   Basophils Absolute 0.0  0.0 - 0.1 K/uL  BASIC METABOLIC PANEL      Result Value Ref Range   Sodium 142  137 - 147 mEq/L   Potassium 3.7  3.7 - 5.3 mEq/L   Chloride 94 (*) 96 - 112 mEq/L   CO2 30  19 - 32 mEq/L   Glucose, Bld 126 (*) 70 - 99  mg/dL   BUN 16  6 - 23 mg/dL   Creatinine, Ser 3.25 (*) 0.50 - 1.10 mg/dL   Calcium 8.9  8.4 - 10.5 mg/dL   GFR calc non Af Amer 15 (*) >90 mL/min   GFR calc Af Amer 18 (*) >90 mL/min  I-STAT TROPOININ, ED      Result Value Ref Range   Troponin i, poc 0.00  0.00 - 0.08 ng/mL   Comment 3            Dg Chest 2 View 03/25/2014   CLINICAL DATA:  Shortness of breath and cough  EXAM: CHEST  2 VIEW  COMPARISON:  DG CHEST 2 VIEW dated 03/30/2013  FINDINGS: The lungs are well-expanded. There is mild hemidiaphragm flattening on the lateral film. The cardiopericardial silhouette is normal in size. The central pulmonary vascularity is prominent. The interstitial markings are minimally prominent though stable. There is no alveolar or pneumonia. There is no pleural effusion. There is mild tortuosity of the descending thoracic aorta.  IMPRESSION: Mild central pulmonary vascular prominence may reflect low-grade CHF. There is no significant pulmonary interstitial edema nor evidence of pleural effusion.   Electronically Signed   By: David  Martinique   On: 03/25/2014 12:41   Nm Pulmonary Perf And Vent 03/25/2014   CLINICAL DATA:  Shortness of Breath  EXAM: NUCLEAR MEDICINE VENTILATION - PERFUSION LUNG SCAN  Views: Anterior, posterior, left lateral, right lateral, RPO, LPO, RAO, LAO -ventilation and perfusion  Radionuclide: Technetium 8m DTPA -ventilation; Technetium 28m macroaggregated albumin-perfusion  Dose:  40.0 mCi-ventilation; 6.0 mCi-perfusion  Route of administration: Inhalation-ventilation; intravenous -perfusion  COMPARISON:  Chest radiograph March 25, 2014; ventilation and perfusion lung scan March 30, 2013  FINDINGS: There is stable ventilation compared to the previous study. There are a few subsegmental defects, but there is no segmental ventilation abnormality.  On the perfusion study, there are a few stable subsegmental defects compared to the previous study. There is no segmental perfusion defect, and there  is no significant ventilation/perfusion mismatch.  IMPRESSION: There are a few subsegmental ventilation and perfusion defects, stable. There is no appreciable ventilation/ perfusion mismatch. These findings constitute a low probability of pulmonary embolus.   Electronically Signed   By: Lowella Grip M.D.   On: 03/25/2014 15:28    1615:  On arrival, pt coughing with diminished lung sounds; short neb given with mild improvement. 2nd short neb given without change. V/Q scan low prob for PE. O2 N/C removed with pt's O2 Sats dropping to 90% R/A. Pt then ambulated off O2 N/C with O2 Sats dropping to low of 80% R/A. Pt back to stretcher with O2 Sats increasing to 87%-92% R/A. O2 N/C reapplied. IV solumedrol and continuous neb started.  Dx and testing d/w pt and family.  Questions answered.  Verb understanding, agreeable to admit.  T/C to [facility]. Karleen Hampshire, case discussed, including:  HPI, pertinent PM/SHx, VS/PE, dx testing, ED course and treatment:  Agreeable to admit, requests to write temporary orders, obtain medical bed to team 10.        Alfonzo Feller, DO 03/26/14 1546

## 2014-03-26 DIAGNOSIS — Z794 Long term (current) use of insulin: Secondary | ICD-10-CM

## 2014-03-26 DIAGNOSIS — E119 Type 2 diabetes mellitus without complications: Secondary | ICD-10-CM

## 2014-03-26 DIAGNOSIS — G4733 Obstructive sleep apnea (adult) (pediatric): Secondary | ICD-10-CM

## 2014-03-26 LAB — GLUCOSE, CAPILLARY
Glucose-Capillary: 180 mg/dL — ABNORMAL HIGH (ref 70–99)
Glucose-Capillary: 184 mg/dL — ABNORMAL HIGH (ref 70–99)
Glucose-Capillary: 299 mg/dL — ABNORMAL HIGH (ref 70–99)
Glucose-Capillary: 149 mg/dL — ABNORMAL HIGH (ref 70–99)
Glucose-Capillary: 163 mg/dL — ABNORMAL HIGH (ref 70–99)
Glucose-Capillary: 180 mg/dL — ABNORMAL HIGH (ref 70–99)

## 2014-03-26 LAB — MRSA PCR SCREENING: MRSA by PCR: POSITIVE — AB

## 2014-03-26 MED ORDER — PREDNISONE 20 MG PO TABS
40.0000 mg | ORAL_TABLET | Freq: Every day | ORAL | Status: DC
  Administered 2014-03-27: 40 mg via ORAL
  Filled 2014-03-26 (×3): qty 2

## 2014-03-26 MED ORDER — METHYLPREDNISOLONE SODIUM SUCC 40 MG IJ SOLR
40.0000 mg | Freq: Three times a day (TID) | INTRAMUSCULAR | Status: AC
  Administered 2014-03-26: 40 mg via INTRAVENOUS
  Filled 2014-03-26: qty 1

## 2014-03-26 MED ORDER — ENOXAPARIN SODIUM 30 MG/0.3ML ~~LOC~~ SOLN
30.0000 mg | SUBCUTANEOUS | Status: DC
  Administered 2014-03-26: 30 mg via SUBCUTANEOUS
  Filled 2014-03-26 (×2): qty 0.3

## 2014-03-26 MED ORDER — IPRATROPIUM-ALBUTEROL 0.5-2.5 (3) MG/3ML IN SOLN
3.0000 mL | Freq: Two times a day (BID) | RESPIRATORY_TRACT | Status: DC
  Administered 2014-03-26 – 2014-03-27 (×2): 3 mL via RESPIRATORY_TRACT
  Filled 2014-03-26 (×2): qty 3

## 2014-03-26 MED ORDER — RENA-VITE PO TABS
1.0000 | ORAL_TABLET | Freq: Every day | ORAL | Status: DC
  Administered 2014-03-26: 1 via ORAL
  Filled 2014-03-26 (×2): qty 1

## 2014-03-26 MED ORDER — ALBUTEROL SULFATE (2.5 MG/3ML) 0.083% IN NEBU
2.5000 mg | INHALATION_SOLUTION | RESPIRATORY_TRACT | Status: DC | PRN
  Administered 2014-03-26: 2.5 mg via RESPIRATORY_TRACT
  Filled 2014-03-26: qty 3

## 2014-03-26 NOTE — Progress Notes (Signed)
Patient continues to refuse CPAP.  States that she wears at home but doesn't likde the hospital provided machines.

## 2014-03-26 NOTE — Progress Notes (Signed)
Pt co shortness of breath.  Paged RT for breathing tx.

## 2014-03-26 NOTE — Consult Note (Signed)
[facility] Renal Consultation Note    Indication for Consultation:  Management of ESRD/hemodialysis; anemia, hypertension/volume and secondary hyperparathyroidism  HPI: [name] is a 53 y.o. female ESRD patient (TTS HD) with past medical history significant for diabetes mellitus, hypertension, and pulmonary embolism who reports feeling short of breath on Tuesday but assumed her symptoms would resolve with dialysis. Worsening symptoms and non-productive cough prompted her to present to the ED where she was found to be hypoxic in the 80s. She was started on nebulizers, steroids and oral antibiotics. Chest xray is significant for mild volume excess but no infiltrate noted. V/Q scan showed low probability for pulmonary embolus. She endorses some allergy symptoms but otherwise feels much better.  Patient is a poor historian.  Dialysis Orders: Center: [facility]  on TTS . EDW 127 kg HD Bath 2K/2.25Ca  Time 4:45 Heparin 3000/ mid 1000. Access L thigh AVG  BFR 400 DFR 800    Hectorol 0 mcg IV/HD Epogen 1200  Units IV/HD  Venofer 0 Recent labs: Hgb 10.9, Tsat 30%, PTH 15, P 4.8   Past Medical History  Diagnosis Date  . HTN (hypertension)   . Obesities, morbid   . Diabetes mellitus type 2 in obese   . GERD (gastroesophageal reflux disease)   . PE (pulmonary embolism) ~ 2010  . Superior vena cava syndrome     Archie Endo 03/26/2013  . Pneumonia     "now & once a long time ago" (03/26/2013)  . Shortness of breath     "just a little bit; at any time" (03/26/2013)  . OSA on CPAP   . Iron deficiency anemia   . Chronic hypotension   . COPD (chronic obstructive pulmonary disease)   . ESRD (end stage renal disease) on dialysis     "TTS; [facility] [place]; (03/25/2014)   Past Surgical History  Procedure Laterality Date  . Vascular surgery    . Thrombectomy and revision of arterioventous (av) goretex  graft Left ~ 12/2012    "thigh"   . Arteriovenous graft placement Left ~ 2011    "thigh" (4  .  Carpal tunnel release Right ~ 2011   Family History  Problem Relation Age of Onset  . Diabetes Mellitus II Mother   . Hypertension Mother   . Hypertension Father   . Diabetes Mellitus II Father    Social History:  reports that she has never smoked. She has never used smokeless tobacco. She reports that she does not drink alcohol or use illicit drugs. Allergies  Allergen Reactions  . Amoxicillin Anaphylaxis    Throat closes and gets sweaty.  . Iohexol      Desc: scratchy/itchy throat/itching on back / nausea    Prior to Admission medications   Medication Sig Start Date End Date Taking? Authorizing Provider  albuterol (PROVENTIL HFA;VENTOLIN HFA) 108 (90 BASE) MCG/ACT inhaler Inhale 2 puffs into the lungs every 6 (six) hours as needed. For shortness of breath.   Yes Historical Provider, MD  calcium acetate (PHOSLO) 667 MG capsule Take 1,334 mg by mouth 3 (three) times daily with meals.    Yes Historical Provider, MD  insulin aspart (NOVOLOG FLEXPEN) 100 UNIT/ML injection Inject 12 Units into the skin 3 (three) times daily before meals.   Yes Historical Provider, MD  insulin glargine (LANTUS) 100 UNIT/ML injection Inject 0.15 mLs (15 Units total) into the skin at bedtime. 03/31/13  Yes Geradine Girt, DO  metoCLOPramide (REGLAN) 5 MG tablet Take 5 mg by mouth  at bedtime.     Yes Historical Provider, MD  midodrine (PROAMATINE) 5 MG tablet Take 5 mg by mouth 2 (two) times daily.     Yes Historical Provider, MD  Multiple Vitamins-Minerals (MULTIVITAMINS THER. W/MINERALS) TABS Take 1 tablet by mouth daily.     Yes Historical Provider, MD   Current Facility-Administered Medications  Medication Dose Route Frequency Provider Last Rate Last Dose  . albuterol (PROVENTIL) (2.5 MG/3ML) 0.083% nebulizer solution 2.5 mg  2.5 mg Nebulization Q4H PRN Adeline C Viyuoh, MD      . calcium acetate (PHOSLO) capsule 1,334 mg  1,334 mg Oral TID WC Hosie Poisson, MD   1,334 mg at 03/26/14 0840  . enoxaparin  (LOVENOX) injection 30 mg  30 mg Subcutaneous Q24H Adeline C Viyuoh, MD      . insulin aspart (novoLOG) injection 0-5 Units  0-5 Units Subcutaneous QHS Hosie Poisson, MD   4 Units at 03/25/14 2139  . insulin aspart (novoLOG) injection 0-9 Units  0-9 Units Subcutaneous TID WC Hosie Poisson, MD   2 Units at 03/26/14 1003  . insulin aspart (novoLOG) injection 12 Units  12 Units Subcutaneous TID AC Hosie Poisson, MD      . insulin glargine (LANTUS) injection 15 Units  15 Units Subcutaneous QHS Hosie Poisson, MD   15 Units at 03/25/14 2139  . ipratropium-albuterol (DUONEB) 0.5-2.5 (3) MG/3ML nebulizer solution 3 mL  3 mL Nebulization BID Kimberlea Oats, MD      . Derrill Memo ON 03/27/2014] levofloxacin (LEVAQUIN) IVPB 750 mg  750 mg Intravenous Q48H Hosie Poisson, MD      . methylPREDNISolone sodium succinate (SOLU-MEDROL) 125 mg/2 mL injection 60 mg  60 mg Intravenous 3 times per day Hosie Poisson, MD   60 mg at 03/26/14 [id number]  . metoCLOPramide (REGLAN) tablet 5 mg  5 mg Oral QHS Hosie Poisson, MD   5 mg at 03/25/14 2207  . midodrine (PROAMATINE) tablet 5 mg  5 mg Oral BID Hosie Poisson, MD   5 mg at 03/26/14 1003  . ondansetron (ZOFRAN) tablet 4 mg  4 mg Oral Q6H PRN Hosie Poisson, MD       Or  . ondansetron (ZOFRAN) injection 4 mg  4 mg Intravenous Q6H PRN Hosie Poisson, MD       Labs: Basic Metabolic Panel:  Recent Labs Lab 03/25/14 1159  NA 142  K 3.7  CL 94*  CO2 30  GLUCOSE 126*  BUN 16  CREATININE 3.25*  CALCIUM 8.9   CBC:  Recent Labs Lab 03/25/14 1159  WBC 7.3  NEUTROABS 5.7  HGB 11.3*  HCT 34.6*  MCV 96.4  PLT 189   CBG:  Recent Labs Lab 03/25/14 1736 03/25/14 2115 03/26/14 0738  GLUCAP 105* 310* 180*   Studies/Results: Dg Chest 2 View  03/25/2014   CLINICAL DATA:  Shortness of breath and cough  EXAM: CHEST  2 VIEW  COMPARISON:  DG CHEST 2 VIEW dated 03/30/2013  FINDINGS: The lungs are well-expanded. There is mild hemidiaphragm flattening on the lateral film. The  cardiopericardial silhouette is normal in size. The central pulmonary vascularity is prominent. The interstitial markings are minimally prominent though stable. There is no alveolar or pneumonia. There is no pleural effusion. There is mild tortuosity of the descending thoracic aorta.  IMPRESSION: Mild central pulmonary vascular prominence may reflect low-grade CHF. There is no significant pulmonary interstitial edema nor evidence of pleural effusion.   Electronically Signed   By: David  Martinique  On: 03/25/2014 12:41   Nm Pulmonary Perf And Vent  03/25/2014   CLINICAL DATA:  Shortness of Breath  EXAM: NUCLEAR MEDICINE VENTILATION - PERFUSION LUNG SCAN  Views: Anterior, posterior, left lateral, right lateral, RPO, LPO, RAO, LAO -ventilation and perfusion  Radionuclide: Technetium 69m DTPA -ventilation; Technetium 70m macroaggregated albumin-perfusion  Dose:  40.0 mCi-ventilation; 6.0 mCi-perfusion  Route of administration: Inhalation-ventilation; intravenous -perfusion  COMPARISON:  Chest radiograph March 25, 2014; ventilation and perfusion lung scan March 30, 2013  FINDINGS: There is stable ventilation compared to the previous study. There are a few subsegmental defects, but there is no segmental ventilation abnormality.  On the perfusion study, there are a few stable subsegmental defects compared to the previous study. There is no segmental perfusion defect, and there is no significant ventilation/perfusion mismatch.  IMPRESSION: There are a few subsegmental ventilation and perfusion defects, stable. There is no appreciable ventilation/ perfusion mismatch. These findings constitute a low probability of pulmonary embolus.   Electronically Signed   By: Lowella Grip M.D.   On: 03/25/2014 15:28    ROS: +Runny eyes, +dry cough. 10 pt ROS asked and answered. All other systems negative except as above.   Physical Exam: Filed Vitals:   03/25/14 2044 03/25/14 2116 03/26/14 0424 03/26/14 1005  BP:  104/76  112/65 107/58  Pulse:  77 91 101  Temp:  98.1 F (36.7 C) 98.4 F (36.9 C) 98.4 F (36.9 C)  TempSrc:    Oral  Resp:  21 22 20   Height:      Weight:  127.506 kg (281 lb 1.6 oz)    SpO2: 94% 98% 93% 96%     General: Well developed, well nourished, in no acute distress. Head: Normocephalic, atraumatic, sclera non-icteric, mucus membranes are moist Neck: Supple. Trace JVD noted Lungs: faint Crackles at bases bilat. + dry cough. scattered wheezes.  No rales or rhonchi. Breathing is unlabored on 2L 02 via [place] Heart: RRR with S1 S2. No murmurs, rubs, or gallops appreciated. Abdomen: Obese, soft, non-tender, non-distended with normoactive bowel sounds. No rebound/guarding. No obvious abdominal masses. M-S:  Strength and tone appear normal for age. Lower extremities: Trace LE edema. No  ischemic changes, no open wounds  Neuro: Alert and oriented X 3. Moves all extremities spontaneously. Psych:  Responds to questions appropriately with a normal affect. Dialysis Access: L thigh AVG + bruit  Dialysis Orders: Center: [facility]  on TTS . EDW 127 kg HD Bath 2K/2.25Ca  Time 4:45 Heparin 3000/ mid 1000. Access L thigh AVG  BFR 400 DFR 800    Hectorol 0 mcg IV/HD Epogen 1200  Units IV/HD  Venofer 0 Recent labs: Hgb 10.9, Tsat 30%, PTH 15, P 4.8   Assessment/Plan: 1. COPD exacerbation - Mgmt per primary. Improving with supportive care, solumedrol and Levaquin.  2. ESRD -  TTS, K+ 3.7, HD tomorrow with added K+ bath 3. Hypertension/volume  - SBPs 100s -110s on Midodrine 5 mg BID. Gets within 0.2kg  of edw consistently as op, but here with vol xs on CXR and physical exam. Not in distress. Plan HD in the am. Challenge EDW. 4. Anemia  - Hgb 11.3 on low dose Epo op. Last Tsat 30%. No ESAs for now. 5. Metabolic bone disease -  Ca 8.9. Phos controlled op. Last PTH 15. Continue binders. 6. Nutrition - Renal carb mod diet, multivit  7. Hx Pulmonary Embolus - Neg V/Q scan. Pt states taking Lovenox every other day  - pharmacy and primary unable  to verify  8. DM - on lantus per primary 9. OSA - Has CPAP, refused here per RT. Pt denies   Sonnie Alamo, PA-C [facility] Pager [phone] 03/26/2014, 11:05 AM  I have seen and examined this patient and agree with plan per Sonnie Alamo.  53yo BF with ESRD since 1991 admitted with SOB probably due to reactive airway disease.  Breathing better after nebulized bronchodilator.  Will plan HD tomorrow. Hold ESA for now. Bennie Dallas 03/26/2014 12:53 PM

## 2014-03-26 NOTE — Progress Notes (Signed)
Pt off floor for MRI via bed.

## 2014-03-26 NOTE — Progress Notes (Addendum)
[facility] PROGRESS NOTE  [name] [id number] DOB: 05/21/1961 DOA: 03/25/2014 PCP: Barbette Merino, MD  Assessment/Plan: 1.COPD exacerbation:  -Continue nebulized bronchodilators, antibiotics and begin tapering steroids -Wind O2, follow and check O2 sats on room air in a.m. 2. Diabetes mellitus:  - Continue Lantus and SSI.  3. history of PE -Patient states that she has had the same supply/prescription from 2014 of Lovenox because she only administers it from time to time -VQ scan is low probability -Will place on prophylactic Lovenox only at this time -Instructed patient that since she's not been compliant with the Lovenox in her PE was in 2006, it will not be further prescribed 4.ESRD -Renal consulted for Dialysis for renal - she is TTS dialysis  Code Status: full Family Communication: partner at bedside Disposition Plan: to home whem medically ready   Consultants:  renal  Procedures:  none  Antibiotics:  Levaquin started on 4/16  HPI/Subjective: Patient states breathing better, but still with nonproductive cough as some chest tightness  Objective: Filed Vitals:   03/26/14 1745  BP: 116/70  Pulse: 74  Temp: 98.2 F (36.8 C)  Resp: 21    Intake/Output Summary (Last 24 hours) at 03/26/14 1750 Last data filed at 03/25/14 2117  Gross per 24 hour  Intake    120 ml  Output      0 ml  Net    120 ml   Filed Weights   03/25/14 1151 03/25/14 1739 03/25/14 2116  Weight: 127.5 kg (281 lb 1.4 oz) 127.597 kg (281 lb 4.8 oz) 127.506 kg (281 lb 1.6 oz)    Exam:  General: alert & oriented x 3In NAD Cardiovascular: RRR, nl S1 s2 Respiratory: CTAB Abdomen: soft +BS NT/ND, no masses palpable Extremities: No cyanosis and no edema    Data Reviewed: Basic Metabolic Panel:  Recent Labs Lab 03/25/14 1159  NA 142  K 3.7  CL 94*  CO2 30  GLUCOSE 126*  BUN 16  CREATININE 3.25*  CALCIUM 8.9   Liver Function Tests: No results found for this  basename: AST, ALT, ALKPHOS, BILITOT, PROT, ALBUMIN,  in the last 168 hours No results found for this basename: LIPASE, AMYLASE,  in the last 168 hours No results found for this basename: AMMONIA,  in the last 168 hours CBC:  Recent Labs Lab 03/25/14 1159  WBC 7.3  NEUTROABS 5.7  HGB 11.3*  HCT 34.6*  MCV 96.4  PLT 189   Cardiac Enzymes: No results found for this basename: CKTOTAL, CKMB, CKMBINDEX, TROPONINI,  in the last 168 hours BNP (last 3 results) No results found for this basename: PROBNP,  in the last 8760 hours CBG:  Recent Labs Lab 03/25/14 2115 03/26/14 0738 03/26/14 1105 03/26/14 1231 03/26/14 1619  GLUCAP 310* 180* 184* 299* 149*    Recent Results (from the past 240 hour(s))  MRSA PCR SCREENING     Status: Abnormal   Collection Time    03/26/14  3:21 AM      Result Value Ref Range Status   MRSA by PCR POSITIVE (*) NEGATIVE Final   Comment:            The GeneXpert MRSA Assay (FDA     approved for NASAL specimens     only), is one component of a     comprehensive MRSA colonization     surveillance program. It is not     intended to diagnose MRSA     infection nor to guide or  monitor treatment for     MRSA infections.     RESULT CALLED TO, READ BACK BY AND VERIFIED WITH:     CALLED TO RN Loa Socks [id number]     Studies: Dg Chest 2 View  03/25/2014   CLINICAL DATA:  Shortness of breath and cough  EXAM: CHEST  2 VIEW  COMPARISON:  DG CHEST 2 VIEW dated 03/30/2013  FINDINGS: The lungs are well-expanded. There is mild hemidiaphragm flattening on the lateral film. The cardiopericardial silhouette is normal in size. The central pulmonary vascularity is prominent. The interstitial markings are minimally prominent though stable. There is no alveolar or pneumonia. There is no pleural effusion. There is mild tortuosity of the descending thoracic aorta.  IMPRESSION: Mild central pulmonary vascular prominence may reflect low-grade CHF. There is no significant  pulmonary interstitial edema nor evidence of pleural effusion.   Electronically Signed   By: David  Martinique   On: 03/25/2014 12:41   Nm Pulmonary Perf And Vent  03/25/2014   CLINICAL DATA:  Shortness of Breath  EXAM: NUCLEAR MEDICINE VENTILATION - PERFUSION LUNG SCAN  Views: Anterior, posterior, left lateral, right lateral, RPO, LPO, RAO, LAO -ventilation and perfusion  Radionuclide: Technetium 44m DTPA -ventilation; Technetium 60m macroaggregated albumin-perfusion  Dose:  40.0 mCi-ventilation; 6.0 mCi-perfusion  Route of administration: Inhalation-ventilation; intravenous -perfusion  COMPARISON:  Chest radiograph March 25, 2014; ventilation and perfusion lung scan March 30, 2013  FINDINGS: There is stable ventilation compared to the previous study. There are a few subsegmental defects, but there is no segmental ventilation abnormality.  On the perfusion study, there are a few stable subsegmental defects compared to the previous study. There is no segmental perfusion defect, and there is no significant ventilation/perfusion mismatch.  IMPRESSION: There are a few subsegmental ventilation and perfusion defects, stable. There is no appreciable ventilation/ perfusion mismatch. These findings constitute a low probability of pulmonary embolus.   Electronically Signed   By: Lowella Grip M.D.   On: 03/25/2014 15:28    Scheduled Meds: . calcium acetate  1,334 mg Oral TID WC  . enoxaparin (LOVENOX) injection  30 mg Subcutaneous Q24H  . insulin aspart  0-5 Units Subcutaneous QHS  . insulin aspart  0-9 Units Subcutaneous TID WC  . insulin aspart  12 Units Subcutaneous TID AC  . insulin glargine  15 Units Subcutaneous QHS  . ipratropium-albuterol  3 mL Nebulization BID  . [START ON 03/27/2014] levofloxacin (LEVAQUIN) IV  750 mg Intravenous Q48H  . methylPREDNISolone (SOLU-MEDROL) injection  60 mg Intravenous 3 times per day  . metoCLOPramide  5 mg Oral QHS  . midodrine  5 mg Oral BID  . multivitamin  1  tablet Oral QHS   Continuous Infusions:   Active Problems:   Cough   ESRD on hemodialysis   OSA (obstructive sleep apnea)   Anemia of chronic disease   Insulin-requiring or dependent type II diabetes mellitus   COPD with acute exacerbation   COPD exacerbation    Time spent: [facility] Pager [phone]. If 7PM-7AM, please contact night-coverage at [email or web address], password [facility] 03/26/2014, 5:50 PM  LOS: 1 day

## 2014-03-26 NOTE — Progress Notes (Signed)
Admitted with SOB. On dialysis.  Takes Lantus 15 units daily at home and Novolog 12 units TID with meals. Is on Solu-medrol 60 mg TID in hospital.  Recommend increasing Novolog correction scale to MODERATE TID & HS and may need to increase Lantus while on steroids.  Will continue to follow.  Harvel Ricks RN BSN CDE

## 2014-03-26 NOTE — Progress Notes (Signed)
Patient refusing to wear CPAP at this time.  Was told if she changed her mind to call RT.

## 2014-03-27 DIAGNOSIS — N186 End stage renal disease: Secondary | ICD-10-CM

## 2014-03-27 DIAGNOSIS — Z992 Dependence on renal dialysis: Secondary | ICD-10-CM

## 2014-03-27 LAB — BASIC METABOLIC PANEL
BUN: 58 mg/dL — ABNORMAL HIGH (ref 6–23)
CHLORIDE: 94 meq/L — AB (ref 96–112)
CO2: 26 meq/L (ref 19–32)
Calcium: 7.8 mg/dL — ABNORMAL LOW (ref 8.4–10.5)
Creatinine, Ser: 7.01 mg/dL — ABNORMAL HIGH (ref 0.50–1.10)
GFR calc Af Amer: 7 mL/min — ABNORMAL LOW (ref >90)
GFR, EST NON AFRICAN AMERICAN: 6 mL/min — AB (ref >90)
GLUCOSE: 161 mg/dL — AB (ref 70–99)
POTASSIUM: 5.3 meq/L (ref 3.7–5.3)
SODIUM: 140 meq/L (ref 137–147)

## 2014-03-27 LAB — RENAL FUNCTION PANEL
ALBUMIN: 3.8 g/dL (ref 3.5–5.2)
BUN: 60 mg/dL — ABNORMAL HIGH (ref 6–23)
CALCIUM: 7.9 mg/dL — AB (ref 8.4–10.5)
CO2: 24 mEq/L (ref 19–32)
Chloride: 92 mEq/L — ABNORMAL LOW (ref 96–112)
Creatinine, Ser: 7.19 mg/dL — ABNORMAL HIGH (ref 0.50–1.10)
GFR calc non Af Amer: 6 mL/min — ABNORMAL LOW (ref >90)
GFR, EST AFRICAN AMERICAN: 7 mL/min — AB (ref >90)
Glucose, Bld: 229 mg/dL — ABNORMAL HIGH (ref 70–99)
PHOSPHORUS: 3.6 mg/dL (ref 2.3–4.6)
Potassium: 4.6 mEq/L (ref 3.7–5.3)
Sodium: 137 mEq/L (ref 137–147)

## 2014-03-27 LAB — CBC
HCT: 33.5 % — ABNORMAL LOW (ref 36.0–46.0)
Hemoglobin: 10.7 g/dL — ABNORMAL LOW (ref 12.0–15.0)
MCH: 30.9 pg (ref 26.0–34.0)
MCHC: 31.9 g/dL (ref 30.0–36.0)
MCV: 96.8 fL (ref 78.0–100.0)
Platelets: 202 10*3/uL (ref 150–400)
RBC: 3.46 MIL/uL — ABNORMAL LOW (ref 3.87–5.11)
RDW: 13.3 % (ref 11.5–15.5)
WBC: 12.4 10*3/uL — ABNORMAL HIGH (ref 4.0–10.5)

## 2014-03-27 LAB — GLUCOSE, CAPILLARY
GLUCOSE-CAPILLARY: 149 mg/dL — AB (ref 70–99)
GLUCOSE-CAPILLARY: 231 mg/dL — AB (ref 70–99)
Glucose-Capillary: 114 mg/dL — ABNORMAL HIGH (ref 70–99)

## 2014-03-27 MED ORDER — ALBUTEROL SULFATE HFA 108 (90 BASE) MCG/ACT IN AERS: 2.0000 | INHALATION_SPRAY | Freq: Four times a day (QID) | RESPIRATORY_TRACT | Status: DC | PRN

## 2014-03-27 MED ORDER — PREDNISONE 20 MG PO TABS: 40.0000 mg | ORAL_TABLET | Freq: Every day | ORAL | Status: DC

## 2014-03-27 MED ORDER — LEVOFLOXACIN 500 MG PO TABS
500.0000 mg | ORAL_TABLET | ORAL | Status: DC
  Administered 2014-03-27: 500 mg via ORAL
  Filled 2014-03-27: qty 1

## 2014-03-27 MED ORDER — LEVOFLOXACIN 750 MG PO TABS: 750.0000 mg | ORAL_TABLET | ORAL | Status: DC

## 2014-03-27 MED ORDER — LEVOFLOXACIN 500 MG PO TABS: ORAL_TABLET | ORAL | Status: DC

## 2014-03-27 NOTE — Progress Notes (Signed)
Subjective:   Breathing better, no current complaints, slept well  Objective: Vital signs in last 24 hours: Temp:  [97.6 F (36.4 C)-98.7 F (37.1 C)] 97.6 F (36.4 C) (04/18 0504) Pulse Rate:  [74-101] 87 (04/18 0504) Resp:  [20-24] 20 (04/18 0504) BP: (100-120)/(58-76) 120/76 mmHg (04/18 0504) SpO2:  [90 %-96 %] 91 % (04/18 0504) Weight:  [128.595 kg (283 lb 8 oz)] 128.595 kg (283 lb 8 oz) (04/17 2140) Weight change: 1.094 kg (2 lb 6.6 oz)  Intake/Output from previous day: 04/17 0701 - 04/18 0700 In: 960 [P.O.:960] Out: 0    Lab Results:  Recent Labs  03/25/14 1159  WBC 7.3  HGB 11.3*  HCT 34.6*  PLT 189   BMET:  Recent Labs  03/25/14 1159  NA 142  K 3.7  CL 94*  CO2 30  GLUCOSE 126*  BUN 16  CREATININE 3.25*  CALCIUM 8.9   No results found for this basename: PTH,  in the last 72 hours Iron Studies: No results found for this basename: IRON, TIBC, TRANSFERRIN, FERRITIN,  in the last 72 hours  Studies/Results: No results found.  EXAM: General appearance:  Alert, in no apparent distress Resp:  Scattered expiratory wheezes bilaterally Cardio:  RRR without murmur or rub GI:  Obese, + BS, soft and nontender Extremities:  Trace edema bilaterally Access:  L thigh AVG with + bruit  Dialysis Orders: Center: [facility] on TTS .  EDW 127 kg HD Bath 2K/2.25Ca Time 4:45 Heparin 3000/ mid 1000. Access L thigh AVG BFR 400 DFR 800  Hectorol 0 mcg IV/HD Epogen 1200 Units IV/HD Venofer 0  Recent labs: Hgb 10.9, Tsat 30%, PTH 15, P 4.8  Assessment/Plan: 1. COPD exacerbation - Improved with supportive care, nebulizers, steroids, and Levaquin.  2. ESRD - HD on TTS @ [place], K+ 3.7.  HD pending today.  3. HTN/volume - BP 120/76 on Midodrine 5 mg bid; under EDW, asymptomatic. Challenge EDW.  4. Anemia - Hgb 11.3 on low dose Epo op; ast Tsat 30%. No ESAs for now.  5. Metabolic bone disease - Ca 8.9 (9.5 corrected), P 5.2, last PTH 15; no Hectorol, Phoslo with meals. 6. Nutrition  - Alb 3.2, renal carb mod diet, multivit. 7. Hx Pulmonary Embolus - Neg V/Q scan; pt states taking Lovenox every other day, but pharmacy and primary unable to verify. 8. DM - on lantus per primary. 9. OSA - on CPAP, refused here per RT, but pt denies      LOS: 2 days   Ramiro Harvest 03/27/2014,7:50 AM  I have seen and examined this patient and agree with plan per Ramiro Harvest.  Breathing better.  No wheezes on exam.  HD today.  ? Home after HD if OK with hospitalist. Bennie Dallas 03/27/2014 9:49 AM

## 2014-03-27 NOTE — Discharge Summary (Addendum)
Physician Discharge Summary  [name] [id number] DOB: 23-Jul-1961 DOA: 03/25/2014  PCP: Barbette Merino, MD  Admit date: 03/25/2014 Discharge date: 03/27/2014  Time spent: >30 minutes  Recommendations for Outpatient Follow-up:  Follow-up Information   Follow up with Barbette Merino, MD. (next week, call for appt upon discharge)    Specialty:  Internal Medicine   Contact information:   [address] [place] [address] [phone]        Discharge Diagnoses:  Active Problems:   Cough   ESRD on hemodialysis   OSA (obstructive sleep apnea)   Anemia of chronic disease   Insulin-requiring or dependent type II diabetes mellitus   COPD with acute exacerbation   COPD exacerbation   Discharge Condition: Improved/stable  Diet recommendation: Renal diet  Filed Weights   03/25/14 2116 03/26/14 2140 03/27/14 1154  Weight: 127.506 kg (281 lb 1.6 oz) 128.595 kg (283 lb 8 oz) 128 kg (282 lb 3 oz)    History of present illness:  [name] is a 53 y.o. female with prior h/o hypertension, type 2 DM, PE, GERD, ESRD on HD on TTS, anemia, Came in after her HD treatment for worsening sob with cough. She reports having sob since last night assocaited with cough , non productive in nature. She denies fever or chills. She denies nausea or vomiting, diarrhea . She denies orthonea or PND. She denies chest pain or palpitations. On arrival to ED, she was found to be hypoxic and was put on [place] oxygen. She was given neb treatments and her breathing improved. Her CXR did not reveal any consolidation, has mild interstitial edema. It was followed up with NM scan for evaluation of PE, . Asp er the patient she is on lovenox at home for PE.   Hospital Course:  1.COPD exacerbation:  -As discussed above, upon admission patient was placed on nebulized bronchodilators, antibiotics and steroids and supplemental oxygen -She improved clinically and the steroids tapered. She has continued to do well-her  mild leukocytosis noted and this a.m. labs a secondary to steroids. She has remained afebrile, hemodynamically stable with improved symptoms. -She will be discharged on antibiotics and prednisone and is to follow up with her PCP in the next week. 2. Diabetes mellitus:  - She was placed on Lantus and covered with SSI. She is to continue her preadmission regimen upon discharge.  3. history of PE  -Patient states that she has had the same supply/prescription from 2014 of Lovenox because she only administers it from time to time  -She had a VQ scan to evaluate for PE and it came back as low probability  -She was placed on prophylactic Lovenox during her hospital stay  -Instructed patient that since she's not been compliant with the Lovenox in her PE was in 2006, it will not be further prescribed>> please note that patient reports that she still had had some of the Lovenox from the 3 month supply that she was prescribed earlier on in 2014.  4.ESRD  -Renal consulted for Dialysis for renal - she is TTS dialysis. Per renal uptake for discharge home today following dialysis.   Procedures:  None  Consultations: Renal Discharge Exam: Filed Vitals:   03/27/14 1416  BP: 122/62  Pulse: 72  Temp:   Resp: 18   Exam:  General: alert & oriented x 3In NAD  Cardiovascular: RRR, nl S1 s2  Respiratory: CTAB  Abdomen: soft +BS NT/ND, no masses palpable  Extremities: No cyanosis and no edema  Discharge Instructions You were cared for by a hospitalist during your hospital stay. If you have any questions about your discharge medications or the care you received while you were in the hospital after you are discharged, you can call the unit and asked to speak with the hospitalist on call if the hospitalist that took care of you is not available. Once you are discharged, your primary care physician will handle any further medical issues. Please note that NO REFILLS for any discharge medications will be  authorized once you are discharged, as it is imperative that you return to your primary care physician (or establish a relationship with a primary care physician if you do not have one) for your aftercare needs so that they can reassess your need for medications and monitor your lab values.  Discharge Orders   Future Orders Complete By Expires   Diet renal [id number]  As directed    Increase activity slowly  As directed        Medication List         albuterol 108 (90 BASE) MCG/ACT inhaler  Commonly known as:  PROVENTIL HFA;VENTOLIN HFA  Inhale 2 puffs into the lungs every 6 (six) hours as needed. For shortness of breath.     calcium acetate 667 MG capsule  Commonly known as:  PHOSLO  Take 1,334 mg by mouth 3 (three) times daily with meals.     insulin glargine 100 UNIT/ML injection  Commonly known as:  LANTUS  Inject 0.15 mLs (15 Units total) into the skin at bedtime.     levofloxacin 750 MG tablet  Commonly known as:  LEVAQUIN  Take 1 tablet (750 mg total) by mouth every other day.     metoCLOPramide 5 MG tablet  Commonly known as:  REGLAN  Take 5 mg by mouth at bedtime.     midodrine 5 MG tablet  Commonly known as:  PROAMATINE  Take 5 mg by mouth 2 (two) times daily.     multivitamins ther. w/minerals Tabs tablet  Take 1 tablet by mouth daily.     NOVOLOG FLEXPEN 100 UNIT/ML injection  Generic drug:  insulin aspart  Inject 12 Units into the skin 3 (three) times daily before meals.     predniSONE 20 MG tablet  Commonly known as:  DELTASONE  Take 2 tablets (40 mg total) by mouth daily with breakfast.       Allergies  Allergen Reactions  . Amoxicillin Anaphylaxis    Throat closes and gets sweaty.  . Iohexol      Desc: scratchy/itchy throat/itching on back / nausea       The results of significant diagnostics from this hospitalization (including imaging, microbiology, ancillary and laboratory) are listed below for reference.    Significant Diagnostic  Studies: Dg Chest 2 View  03/25/2014   CLINICAL DATA:  Shortness of breath and cough  EXAM: CHEST  2 VIEW  COMPARISON:  DG CHEST 2 VIEW dated 03/30/2013  FINDINGS: The lungs are well-expanded. There is mild hemidiaphragm flattening on the lateral film. The cardiopericardial silhouette is normal in size. The central pulmonary vascularity is prominent. The interstitial markings are minimally prominent though stable. There is no alveolar or pneumonia. There is no pleural effusion. There is mild tortuosity of the descending thoracic aorta.  IMPRESSION: Mild central pulmonary vascular prominence may reflect low-grade CHF. There is no significant pulmonary interstitial edema nor evidence of pleural effusion.   Electronically Signed   By: David  Martinique  On: 03/25/2014 12:41   Nm Pulmonary Perf And Vent  03/25/2014   CLINICAL DATA:  Shortness of Breath  EXAM: NUCLEAR MEDICINE VENTILATION - PERFUSION LUNG SCAN  Views: Anterior, posterior, left lateral, right lateral, RPO, LPO, RAO, LAO -ventilation and perfusion  Radionuclide: Technetium 87m DTPA -ventilation; Technetium 70m macroaggregated albumin-perfusion  Dose:  40.0 mCi-ventilation; 6.0 mCi-perfusion  Route of administration: Inhalation-ventilation; intravenous -perfusion  COMPARISON:  Chest radiograph March 25, 2014; ventilation and perfusion lung scan March 30, 2013  FINDINGS: There is stable ventilation compared to the previous study. There are a few subsegmental defects, but there is no segmental ventilation abnormality.  On the perfusion study, there are a few stable subsegmental defects compared to the previous study. There is no segmental perfusion defect, and there is no significant ventilation/perfusion mismatch.  IMPRESSION: There are a few subsegmental ventilation and perfusion defects, stable. There is no appreciable ventilation/ perfusion mismatch. These findings constitute a low probability of pulmonary embolus.   Electronically Signed   By: Lowella Grip M.D.   On: 03/25/2014 15:28    Microbiology: Recent Results (from the past 240 hour(s))  MRSA PCR SCREENING     Status: Abnormal   Collection Time    03/26/14  3:21 AM      Result Value Ref Range Status   MRSA by PCR POSITIVE (*) NEGATIVE Final   Comment:            The GeneXpert MRSA Assay (FDA     approved for NASAL specimens     only), is one component of a     comprehensive MRSA colonization     surveillance program. It is not     intended to diagnose MRSA     infection nor to guide or     monitor treatment for     MRSA infections.     RESULT CALLED TO, READ BACK BY AND VERIFIED WITH:     CALLED TO RN [facility] [phone]     Labs: Basic Metabolic Panel:  Recent Labs Lab 03/25/14 1159 03/27/14 0845 03/27/14 1210  NA 142 140 137  K 3.7 5.3 4.6  CL 94* 94* 92*  CO2 30 26 24   GLUCOSE 126* 161* 229*  BUN 16 58* 60*  CREATININE 3.25* 7.01* 7.19*  CALCIUM 8.9 7.8* 7.9*  PHOS  --   --  3.6   Liver Function Tests:  Recent Labs Lab 03/27/14 1210  ALBUMIN 3.8   No results found for this basename: LIPASE, AMYLASE,  in the last 168 hours No results found for this basename: AMMONIA,  in the last 168 hours CBC:  Recent Labs Lab 03/25/14 1159 03/27/14 1210  WBC 7.3 12.4*  NEUTROABS 5.7  --   HGB 11.3* 10.7*  HCT 34.6* 33.5*  MCV 96.4 96.8  PLT 189 202   Cardiac Enzymes: No results found for this basename: CKTOTAL, CKMB, CKMBINDEX, TROPONINI,  in the last 168 hours BNP: BNP (last 3 results) No results found for this basename: PROBNP,  in the last 8760 hours CBG:  Recent Labs Lab 03/26/14 1619 03/26/14 1745 03/26/14 2135 03/27/14 0745 03/27/14 1141  GLUCAP 149* 163* 180* 149* 231*       Signed:  Dystany Duffy C Trevyon Swor  [facility] 03/27/2014, 2:24 PM

## 2014-06-17 IMAGING — XA IR ANGIO AV SHUNT ADDL ACCESS
1 series · 13 of 24 positions shown · non-contrast
Comparison: Diagnostic shuntogram- 01/02/2012

INDICATION: Clotted graft

   1.    FISTULALYSIS
2.    ANGIOPLASTY OF VENOUS LIMB AND VENOUS ANASTOMOSIS
3.    PERCUTANEOUS PLACEMENT OF A COVERED STENT GRAFT TO TREAT A
FOCAL PSEUDOANEURSYM WITHIN THE VENOUS LIMB
4.    ULTRASOUND GUIDANCE FOR VENOUS ACCESS
TECHNIQUE: Informed written consent was obtained from the patient after a
discussion of the risks, benefits and alternatives to treatment.
Questions regarding the procedure were encouraged and answered.  A
timeout was performed prior to the initiation of the procedure.

[Series 1: run · 13 of 107 slices shown]
[im 1/107]
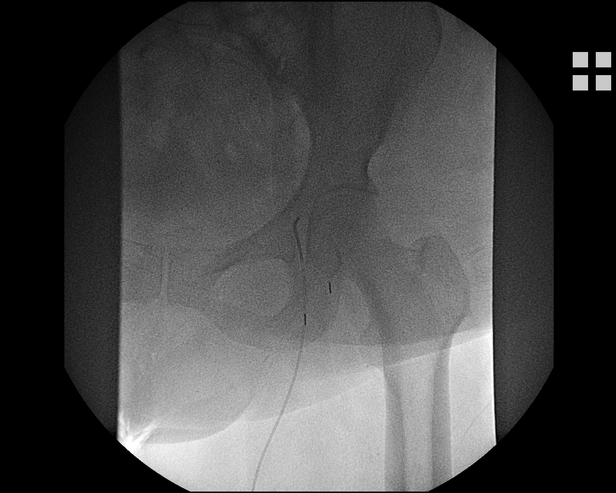
[im 10/107]
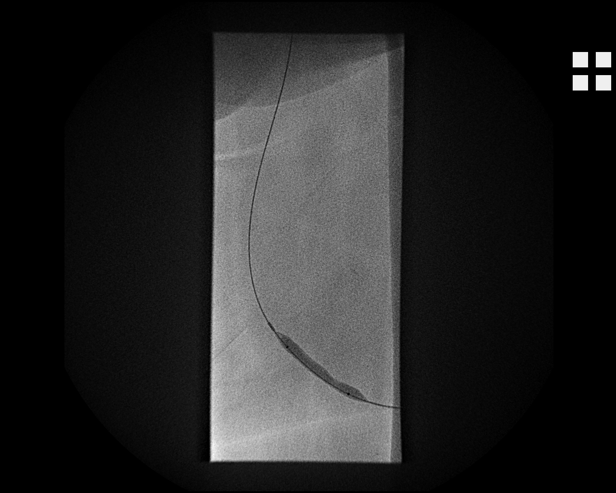
[im 19/107]
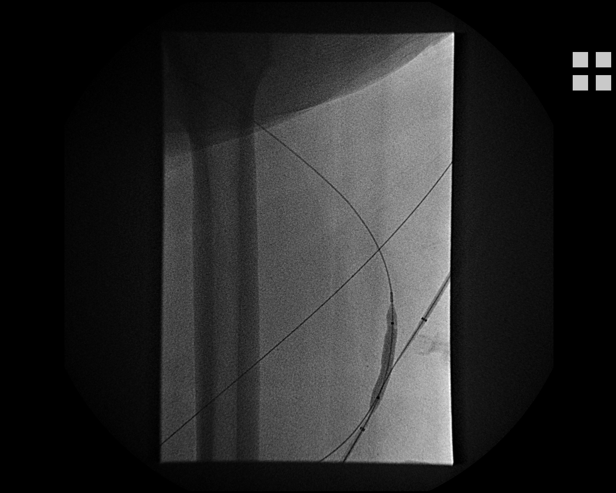
[im 28/107]
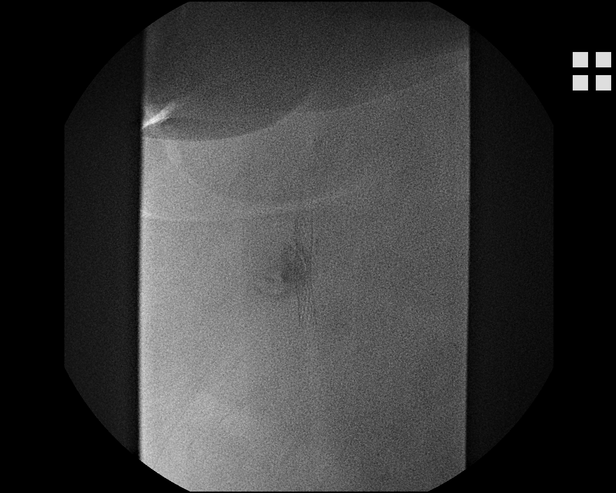
[im 37/107]
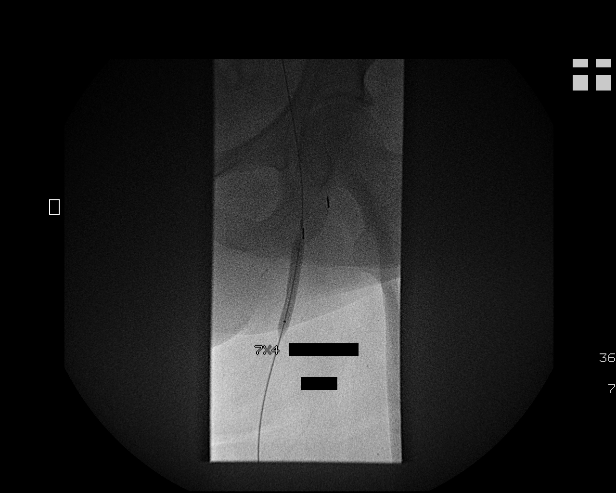
[im 47/107]
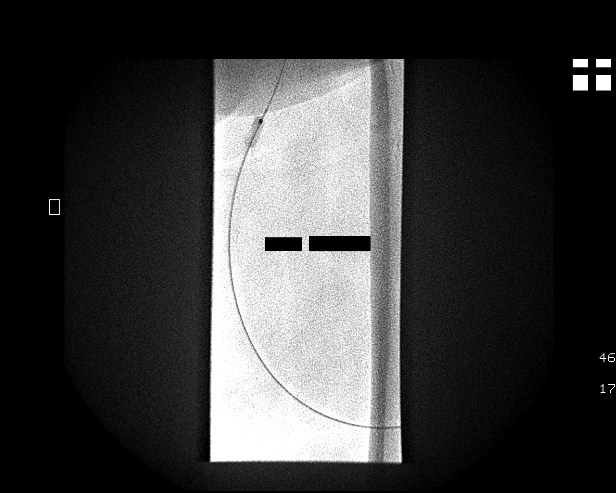
[im 56/107]
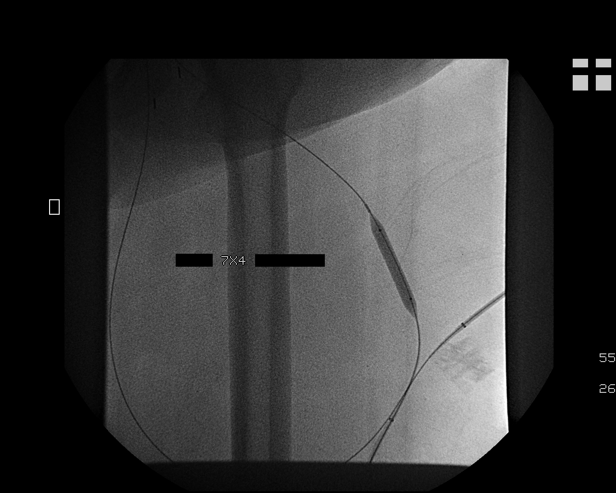
[im 60/107]
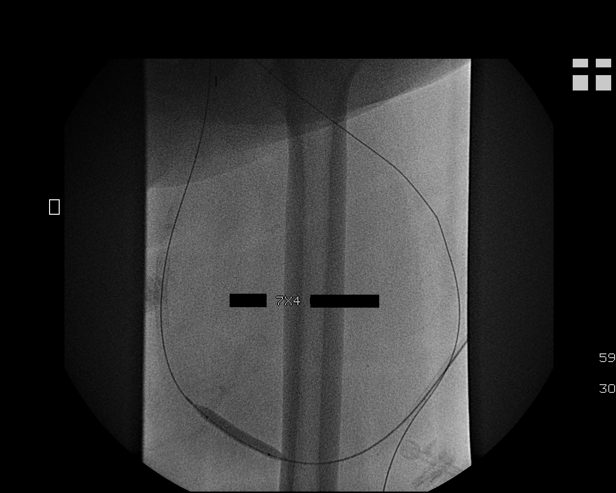
[im 70/107]
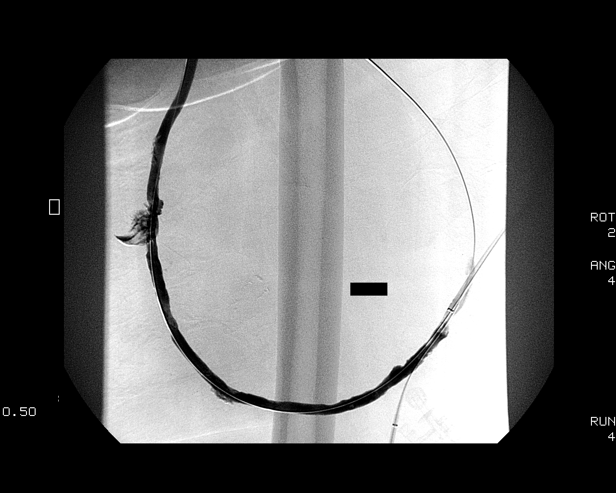
[im 79/107]
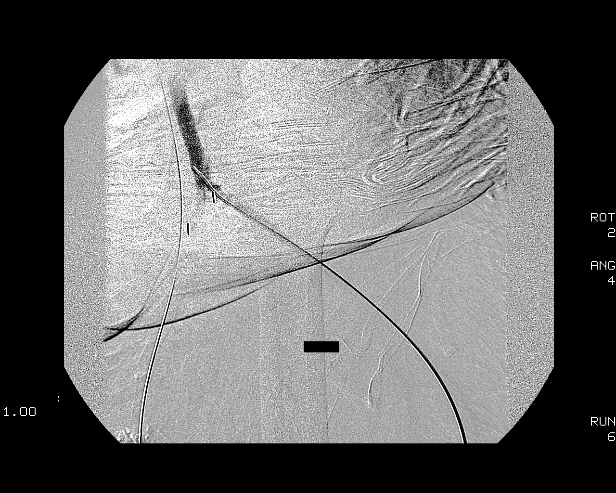
[im 88/107]
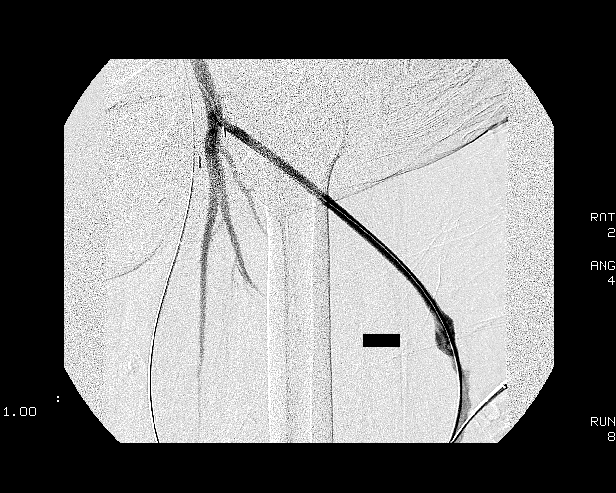
[im 97/107]
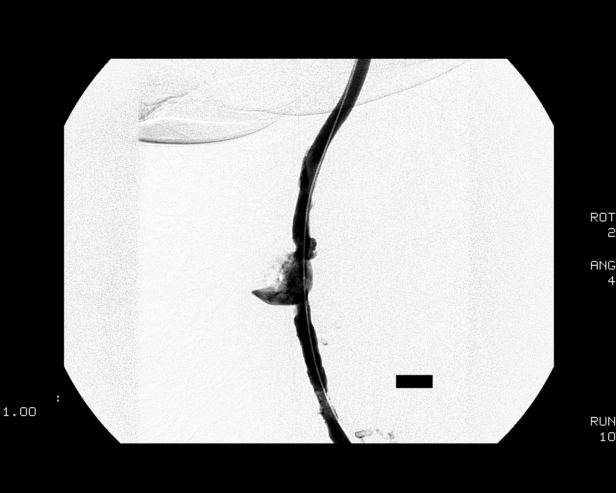
[im 107/107]
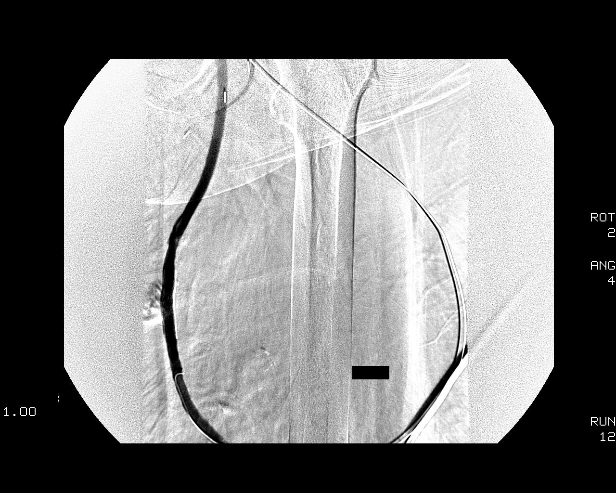

[13 of 24 positions shown; findings below may reference images not displayed]

Medications: Versed 1 mg IV; Fentanyl 50 mcg IV; Heparin 2777 units
IV; TPA 4 mg into graft.}

Contrast: 80 ml [id number]

Total Moderate Sedation Time: 94 minutes

Fluoroscopy Time: 19.4 minutes.

Complications: None immediate
On physical examination, the existing left thigh dialysis graft was
negative for palpable pulse or thrill.  A non pulsatile soft tissue
mass was noted overlying the mid-aspect of the venous limb of the
graft, which the patients states is of chronic etiology.  No
surrounding erythema.

The skin overlying the graft was prepped and draped in the usual
sterile fashion, and a sterile drape was applied covering the
operative field.  Maximum barrier sterile technique with sterile
gowns and gloves were used for the procedure.

Under ultrasound guidance, the dialysis graft was accessed directed
towards the venous anastomosis with a micropuncture kit after the
overlying soft tissues were anesthetized with 1% lidocaine.  An
ultrasound image was saved for documentation purposes.  The
micropuncture sheath was exchange for a 7-French vascular sheath
over a guidewire.  Over a Benson wire, a Kumpe catheter was
advanced centrally and a central venogram was performed.  Pull back
venogram was performed with the Kumpe catheter.  Heparin was
administered systemically and TPA was administered via the Kumpe
catheter throughout near the entirety of the venous limb.

The venous anastomosis and entirety of the venous limb was
angioplastied with a 7 mm x 4 cm Conquest balloon.  An additional
access was obtained directed towards the arterial anastomoses with
a micropuncture kit after the overlying soft tissues anesthetized
with 1% lidocaine.  This allowed for placement of a 6-French
vascular sheath.  The graft was thrombectomized with several rounds
of push-pull mechanical thrombectomy with an occlusion balloon.
Flow was restored to the graft as evidenced by restored pulsatility
of the graft and brisk blood return from the side arm of the
vascular sheath.  Shuntograms were performed.

Shuntogram performed from the native arterial system demonstrated a
minimal amount of residual platelet plug regional to the arterial
anastomosis as well as a minimal amount of residual mural thrombus
within a slightly aneurysmal component of the "stick zone" of the
graft.  As such, the arterial limb was thrombectomized with several
additional rounds of mechanical thrombectomy with the occlusion
balloon.  Residual mild irregular narrowing of the slightly
aneurysmal venous "stick zone" of the graft was angioplastied at
multiple stations with a 7 mm x 4 cm Conquest balloon. Post
angioplasty shuntogram was performed.

A focal pseudoaneurysm arising from the mid aspect of the venous
limb of the graft was noted to contain a moderate amount of mural
thrombus and was felt to have likely contributed to the patient's
graft thrombosis.  As such, this pseudoaneurysm was treated with
deployment of an 8 mm x 4 cm FLAIR covered stent graft across the
neck of the pseudoaneurysm.  The stent was angioplastied to 7 mm
diameter.

A smaller area of extravasation at the presumed prior on dialysis
access was treated with prolonged insufflation of with the 7 mm
angioplasty balloon at the area of extravasation.  Completion
shuntograms were performed.

At this point, the procedure was terminated.  All wires, catheters
and sheaths were removed from the patient.  Hemostasis was achieved
at both access sites with deployment of a swizzle sutures which
will be removed at the patient's next dialysis session. Dressings
were placed.  The patient tolerated the procedure without immediate
postprocedural complication.
FINDINGS: The existing left thigh AV graft is thrombosed.  The venous
anastomosis and the entirety of the venous limb was angioplastied
to 7 mm diameter.  The graft was successfully thrombectomized using
mechanical and pharmacologic means as above.

A minimal amount of adherent thrombus noted regional to the
arterial anastomosis was treated with several additional rounds of
pull thrombectomy.  Multiple tandem areas of mild irregular
narrowing within the slightly aneurysmal components of the "stick
zone" of the graft within the lateral aspect of the thigh was
successfully treated with angioplasty to 7 mm diameter at multiple
stations.  Completion shuntogram demonstrates wide patency of the
arterial anastomosis, arterial limb and lateral thigh "stick zone."

A small pseudoaneurysm within the mid aspect of the venous limb was
noted to contain a moderate amount of mural thrombus and was felt
to have contributed to the graft thrombosis.  As such, the
pseudoaneurysm was successfully treated with deployment of an 8 mm
x 4 cm covered stent graft across the neck of the pseudoaneurysm.
The stent graft with subsequent angioplasty to 7 mm diameter.
Completion shuntogram demonstrates complete exclusion of the
pseudoaneurysm.

A small area of extravasation within the more distal aspect of the
venous limb at a site of presumed prior dialysis access was
successfully treated with prolonged balloon angioplasty at the
origin of the extravasation.  Completion shuntogram demonstrates
wide patency of the venous line and anastomosis was negative for
complication, specifically, no evidence of contrast extravasation
or vessel dissection.

The left lower extremity venous system and IVC is widely patent to
the level of the inferior cavoatrial junction.
IMPRESSION: 1.  Technically successful left thigh AV graft thrombolysis.

2.  Successful angioplasty of the venous anastomosis and entirety
of the venous limb to 7 mm diameter.  Successful exclusion of a
focal pseudoaneurysm within the mid aspect of the venous limb with
deployment of a covered stent graft.  Completion shuntogram
demonstrates the venous limb and anastomosis are widely patent.

3.  Successful angioplasty of multiple tandem mild irregular
narrowings within a mildly aneurysmal component of the lateral
thigh "stick zone" of the graft to 7 mm diameter.

4.  The arterial anastomosis and arterial limb are widely patent.

5.  The central venous system is widely patent.

Access Management:

This graft would be amenable to future percutaneous intervention as
clinically indicated.

## 2014-08-07 IMAGING — CR DG CHEST 2V
1 series · 1 of 1 positions shown · non-contrast
Comparison: 10/13/2010.

CLINICAL DATA: Cough, fever, shortness of breath and chest
discomfort.  History of hypertension and diabetes.

CHEST - 2 VIEW

[w chest lat]
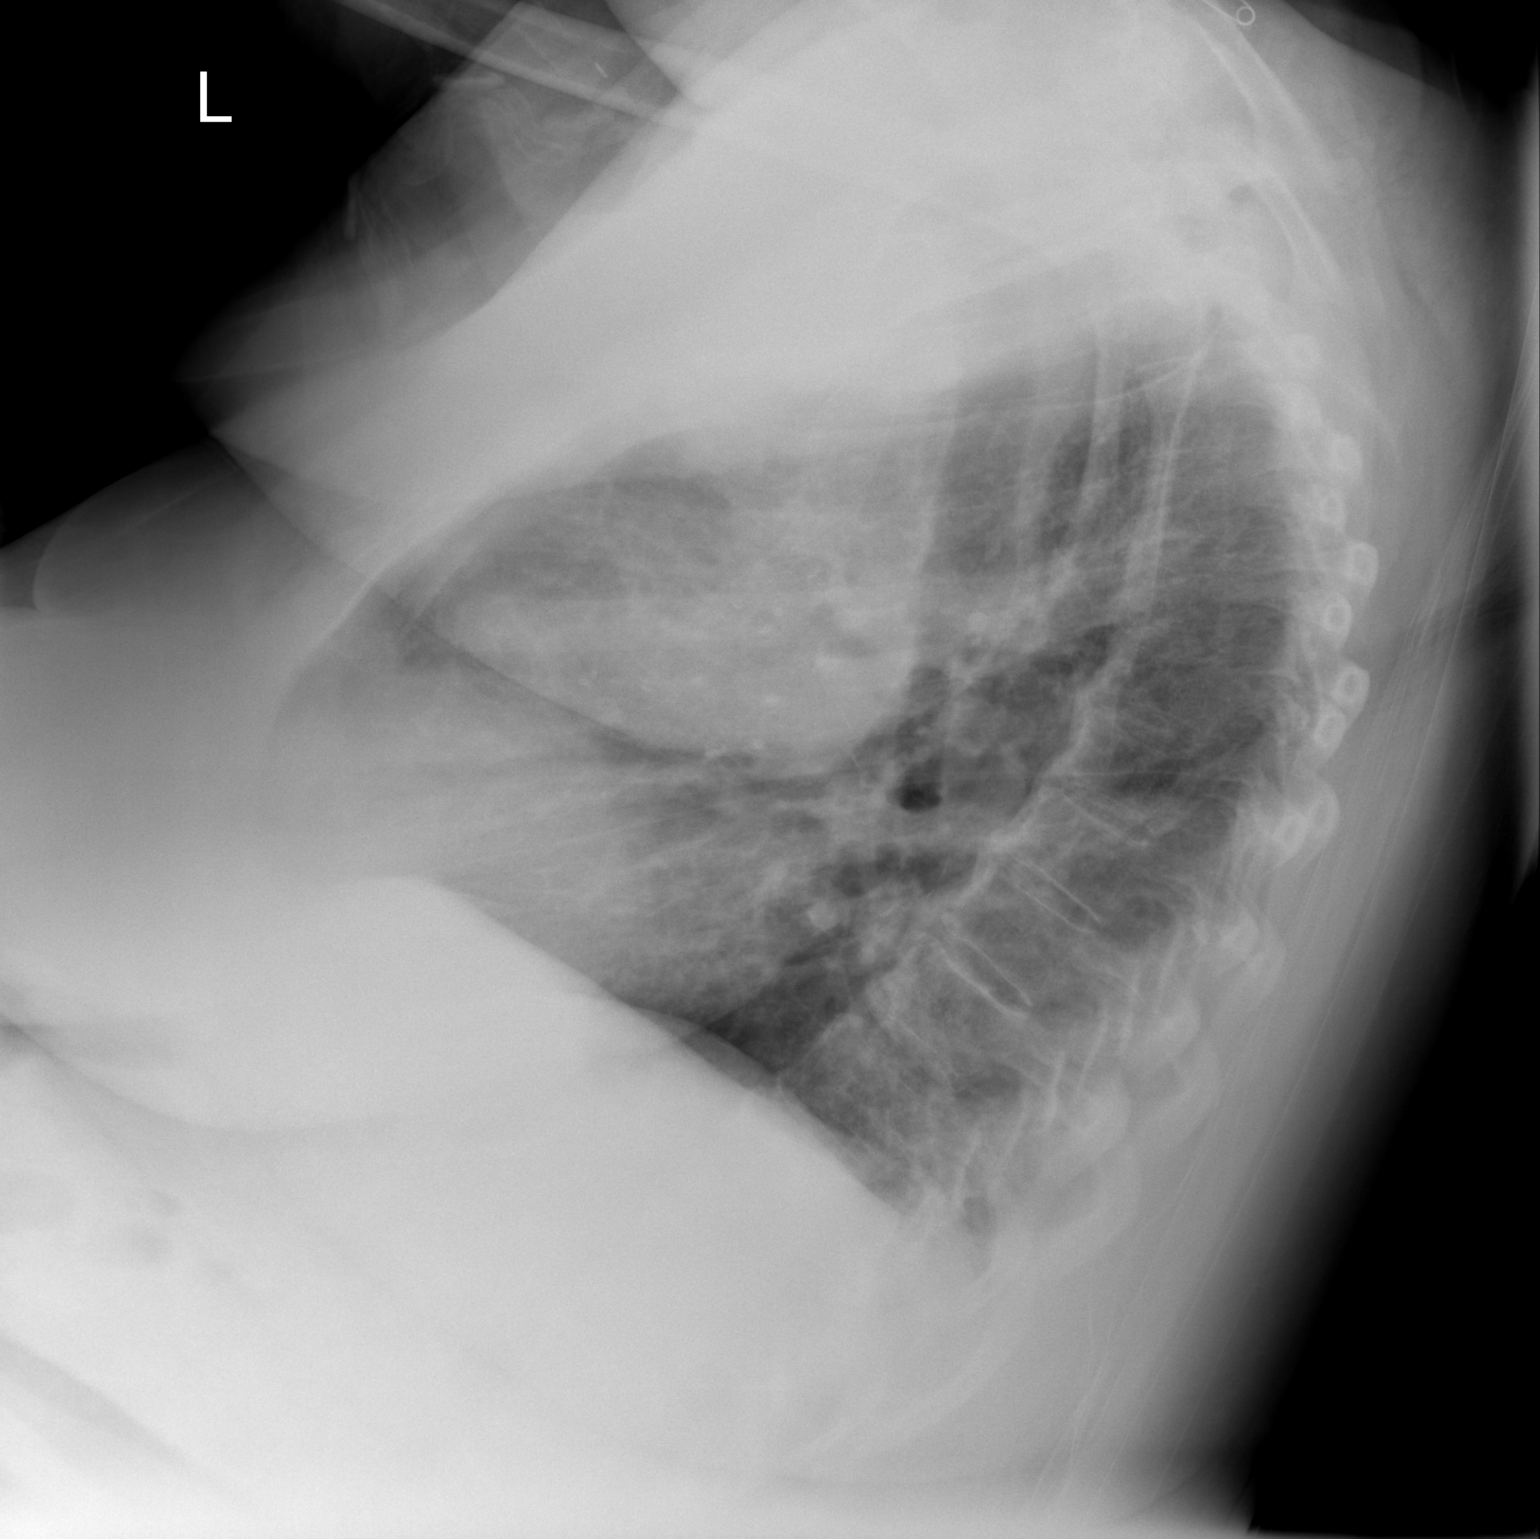

[1 of 1 positions shown; findings below may reference images not displayed]

FINDINGS: 5666 hours.  There is mild patient rotation to the left.
Heart size and mediastinal contours are stable.  The lungs are now
clear.  There is no pleural effusion or pneumothorax.  Osteophytes
are noted throughout the thoracic spine associated with a mild
convex right scoliosis.
IMPRESSION: Interval improved aeration of the lungs.  No acute cardiopulmonary
process.

## 2014-08-10 ENCOUNTER — Inpatient Hospital Stay (HOSPITAL_COMMUNITY)
Admission: EM | Admit: 2014-08-10 | Discharge: 2014-08-14 | DRG: 682 | Disposition: A | Discharge status: Home / Self Care | Payer: Medicare Other | Attending: Internal Medicine | Admitting: Internal Medicine

## 2014-08-10 ENCOUNTER — Emergency Department (HOSPITAL_COMMUNITY): Payer: Medicare Other

## 2014-08-10 ENCOUNTER — Encounter (HOSPITAL_COMMUNITY): Payer: Self-pay | Admitting: Emergency Medicine

## 2014-08-10 DIAGNOSIS — R059 Cough, unspecified: Secondary | ICD-10-CM

## 2014-08-10 DIAGNOSIS — E8779 Other fluid overload: Secondary | ICD-10-CM | POA: Diagnosis present

## 2014-08-10 DIAGNOSIS — J810 Acute pulmonary edema: Secondary | ICD-10-CM | POA: Diagnosis present

## 2014-08-10 DIAGNOSIS — R197 Diarrhea, unspecified: Secondary | ICD-10-CM

## 2014-08-10 DIAGNOSIS — Z6841 Body Mass Index (BMI) 40.0 and over, adult: Secondary | ICD-10-CM | POA: Diagnosis not present

## 2014-08-10 DIAGNOSIS — R05 Cough: Secondary | ICD-10-CM

## 2014-08-10 DIAGNOSIS — I2789 Other specified pulmonary heart diseases: Secondary | ICD-10-CM | POA: Diagnosis present

## 2014-08-10 DIAGNOSIS — I871 Compression of vein: Secondary | ICD-10-CM | POA: Diagnosis present

## 2014-08-10 DIAGNOSIS — K219 Gastro-esophageal reflux disease without esophagitis: Secondary | ICD-10-CM | POA: Diagnosis present

## 2014-08-10 DIAGNOSIS — N186 End stage renal disease: Secondary | ICD-10-CM

## 2014-08-10 DIAGNOSIS — I120 Hypertensive chronic kidney disease with stage 5 chronic kidney disease or end stage renal disease: Secondary | ICD-10-CM | POA: Diagnosis present

## 2014-08-10 DIAGNOSIS — N2581 Secondary hyperparathyroidism of renal origin: Secondary | ICD-10-CM | POA: Diagnosis present

## 2014-08-10 DIAGNOSIS — J9620 Acute and chronic respiratory failure, unspecified whether with hypoxia or hypercapnia: Secondary | ICD-10-CM | POA: Diagnosis present

## 2014-08-10 DIAGNOSIS — Z833 Family history of diabetes mellitus: Secondary | ICD-10-CM | POA: Diagnosis not present

## 2014-08-10 DIAGNOSIS — J9601 Acute respiratory failure with hypoxia: Secondary | ICD-10-CM | POA: Diagnosis present

## 2014-08-10 DIAGNOSIS — Z22322 Carrier or suspected carrier of Methicillin resistant Staphylococcus aureus: Secondary | ICD-10-CM | POA: Diagnosis not present

## 2014-08-10 DIAGNOSIS — I272 Pulmonary hypertension, unspecified: Secondary | ICD-10-CM

## 2014-08-10 DIAGNOSIS — IMO0002 Reserved for concepts with insufficient information to code with codable children: Secondary | ICD-10-CM

## 2014-08-10 DIAGNOSIS — R0689 Other abnormalities of breathing: Secondary | ICD-10-CM

## 2014-08-10 DIAGNOSIS — Z992 Dependence on renal dialysis: Secondary | ICD-10-CM

## 2014-08-10 DIAGNOSIS — Z794 Long term (current) use of insulin: Secondary | ICD-10-CM | POA: Diagnosis not present

## 2014-08-10 DIAGNOSIS — E872 Acidosis, unspecified: Secondary | ICD-10-CM | POA: Diagnosis present

## 2014-08-10 DIAGNOSIS — J441 Chronic obstructive pulmonary disease with (acute) exacerbation: Secondary | ICD-10-CM | POA: Diagnosis present

## 2014-08-10 DIAGNOSIS — E119 Type 2 diabetes mellitus without complications: Secondary | ICD-10-CM | POA: Diagnosis present

## 2014-08-10 DIAGNOSIS — Z86711 Personal history of pulmonary embolism: Secondary | ICD-10-CM | POA: Diagnosis not present

## 2014-08-10 DIAGNOSIS — Z66 Do not resuscitate: Secondary | ICD-10-CM | POA: Diagnosis present

## 2014-08-10 DIAGNOSIS — D649 Anemia, unspecified: Secondary | ICD-10-CM | POA: Diagnosis present

## 2014-08-10 DIAGNOSIS — D638 Anemia in other chronic diseases classified elsewhere: Secondary | ICD-10-CM | POA: Diagnosis present

## 2014-08-10 DIAGNOSIS — G4733 Obstructive sleep apnea (adult) (pediatric): Secondary | ICD-10-CM | POA: Diagnosis present

## 2014-08-10 DIAGNOSIS — R509 Fever, unspecified: Secondary | ICD-10-CM

## 2014-08-10 DIAGNOSIS — J9600 Acute respiratory failure, unspecified whether with hypoxia or hypercapnia: Secondary | ICD-10-CM | POA: Diagnosis present

## 2014-08-10 LAB — BLOOD GAS, ARTERIAL
ACID-BASE DEFICIT: 1.2 mmol/L (ref 0.0–2.0)
Acid-Base Excess: 0.7 mmol/L (ref 0.0–2.0)
Bicarbonate: 28.4 mEq/L — ABNORMAL HIGH (ref 20.0–24.0)
Bicarbonate: 27.3 mEq/L — ABNORMAL HIGH (ref 20.0–24.0)
DRAWN BY: 24686
Delivery systems: POSITIVE
Drawn by: 249101
Expiratory PAP: 5
FIO2: 1 %
FIO2: 0.6 %
INSPIRATORY PAP: 14
LHR: 20 {breaths}/min
O2 SAT: 94.4 %
O2 SAT: 95.7 %
PATIENT TEMPERATURE: 98.6
PCO2 ART: 65.7 mmHg — AB (ref 35.0–45.0)
PO2 ART: 93.1 mmHg (ref 80.0–100.0)
Patient temperature: 98.6
TCO2: 31.6 mmol/L (ref 0–100)
TCO2: 29.3 mmol/L (ref 0–100)
pCO2 arterial: 105 mmHg (ref 35.0–45.0)
pH, Arterial: 7.06 — CL (ref 7.350–7.450)
pH, Arterial: 7.242 — ABNORMAL LOW (ref 7.350–7.450)
pO2, Arterial: 81.5 mmHg (ref 80.0–100.0)

## 2014-08-10 LAB — BASIC METABOLIC PANEL
Anion gap: 17 — ABNORMAL HIGH (ref 5–15)
BUN: 37 mg/dL — ABNORMAL HIGH (ref 6–23)
CALCIUM: 8.7 mg/dL (ref 8.4–10.5)
CO2: 24 mEq/L (ref 19–32)
CREATININE: 7.85 mg/dL — AB (ref 0.50–1.10)
Chloride: 94 mEq/L — ABNORMAL LOW (ref 96–112)
GFR, EST AFRICAN AMERICAN: 6 mL/min — AB (ref >90)
GFR, EST NON AFRICAN AMERICAN: 5 mL/min — AB (ref >90)
Glucose, Bld: 123 mg/dL — ABNORMAL HIGH (ref 70–99)
Potassium: 4.1 mEq/L (ref 3.7–5.3)
Sodium: 135 mEq/L — ABNORMAL LOW (ref 137–147)

## 2014-08-10 LAB — MRSA PCR SCREENING: MRSA by PCR: POSITIVE — AB

## 2014-08-10 LAB — RENAL FUNCTION PANEL
ALBUMIN: 4.2 g/dL (ref 3.5–5.2)
Anion gap: 16 — ABNORMAL HIGH (ref 5–15)
BUN: 33 mg/dL — AB (ref 6–23)
CO2: 27 mEq/L (ref 19–32)
CREATININE: 7.16 mg/dL — AB (ref 0.50–1.10)
Calcium: 8.8 mg/dL (ref 8.4–10.5)
Chloride: 96 mEq/L (ref 96–112)
GFR calc Af Amer: 7 mL/min — ABNORMAL LOW (ref >90)
GFR calc non Af Amer: 6 mL/min — ABNORMAL LOW (ref >90)
Glucose, Bld: 151 mg/dL — ABNORMAL HIGH (ref 70–99)
PHOSPHORUS: 5.8 mg/dL — AB (ref 2.3–4.6)
Potassium: 4.5 mEq/L (ref 3.7–5.3)
Sodium: 139 mEq/L (ref 137–147)

## 2014-08-10 LAB — CBC
HCT: 37.8 % (ref 36.0–46.0)
Hemoglobin: 11.5 g/dL — ABNORMAL LOW (ref 12.0–15.0)
MCH: 28.5 pg (ref 26.0–34.0)
MCHC: 30.4 g/dL (ref 30.0–36.0)
MCV: 93.6 fL (ref 78.0–100.0)
Platelets: 173 10*3/uL (ref 150–400)
RBC: 4.04 MIL/uL (ref 3.87–5.11)
RDW: 14.3 % (ref 11.5–15.5)
WBC: 8.8 10*3/uL (ref 4.0–10.5)

## 2014-08-10 LAB — GLUCOSE, CAPILLARY
Glucose-Capillary: 119 mg/dL — ABNORMAL HIGH (ref 70–99)
Glucose-Capillary: 158 mg/dL — ABNORMAL HIGH (ref 70–99)

## 2014-08-10 LAB — TROPONIN I
Troponin I: <0.3 ng/mL (ref <0.30)
Troponin I: <0.3 ng/mL (ref <0.30)

## 2014-08-10 LAB — PRO B NATRIURETIC PEPTIDE: Pro B Natriuretic peptide (BNP): 1903 pg/mL — ABNORMAL HIGH (ref 0–125)

## 2014-08-10 LAB — I-STAT TROPONIN, ED: TROPONIN I, POC: 0 ng/mL (ref 0.00–0.08)

## 2014-08-10 LAB — PROCALCITONIN: PROCALCITONIN: 0.46 ng/mL

## 2014-08-10 MED ORDER — NEPRO/CARBSTEADY PO LIQD
237.0000 mL | ORAL | Status: DC | PRN
Start: 2014-08-10 — End: 2014-08-14

## 2014-08-10 MED ORDER — DARBEPOETIN ALFA-POLYSORBATE 40 MCG/0.4ML IJ SOLN
40.0000 ug | INTRAMUSCULAR | Status: DC
  Filled 2014-08-10: qty 0.4

## 2014-08-10 MED ORDER — LEVOFLOXACIN IN D5W 500 MG/100ML IV SOLN
500.0000 mg | INTRAVENOUS | Status: DC
  Administered 2014-08-12: 500 mg via INTRAVENOUS
  Filled 2014-08-10 (×2): qty 100

## 2014-08-10 MED ORDER — CALCIUM ACETATE 667 MG PO CAPS
2001.0000 mg | ORAL_CAPSULE | Freq: Three times a day (TID) | ORAL | Status: DC
  Administered 2014-08-11 – 2014-08-13 (×7): 2001 mg via ORAL
  Filled 2014-08-10 (×15): qty 3

## 2014-08-10 MED ORDER — ACETAMINOPHEN 325 MG PO TABS
650.0000 mg | ORAL_TABLET | Freq: Four times a day (QID) | ORAL | Status: DC | PRN
Start: 2014-08-10 — End: 2014-08-14

## 2014-08-10 MED ORDER — SODIUM CHLORIDE 0.9 % IV SOLN
100.0000 mL | INTRAVENOUS | Status: DC | PRN
Start: 2014-08-10 — End: 2014-08-14

## 2014-08-10 MED ORDER — METHYLPREDNISOLONE SODIUM SUCC 125 MG IJ SOLR
60.0000 mg | Freq: Four times a day (QID) | INTRAMUSCULAR | Status: DC
  Administered 2014-08-10 – 2014-08-11 (×3): 60 mg via INTRAVENOUS
  Filled 2014-08-10 (×7): qty 0.96

## 2014-08-10 MED ORDER — METHYLPREDNISOLONE SODIUM SUCC 125 MG IJ SOLR
60.0000 mg | Freq: Two times a day (BID) | INTRAMUSCULAR | Status: DC
  Filled 2014-08-10 (×2): qty 0.96

## 2014-08-10 MED ORDER — ALBUTEROL SULFATE (2.5 MG/3ML) 0.083% IN NEBU
5.0000 mg | INHALATION_SOLUTION | Freq: Four times a day (QID) | RESPIRATORY_TRACT | Status: DC
  Administered 2014-08-10 (×3): 5 mg via RESPIRATORY_TRACT
  Filled 2014-08-10 (×3): qty 6

## 2014-08-10 MED ORDER — RENA-VITE PO TABS
1.0000 | ORAL_TABLET | Freq: Every day | ORAL | Status: DC
Start: 2014-08-10 — End: 2014-08-14
  Administered 2014-08-10 – 2014-08-13 (×4): 1 via ORAL
  Filled 2014-08-10 (×6): qty 1

## 2014-08-10 MED ORDER — ALBUTEROL SULFATE (2.5 MG/3ML) 0.083% IN NEBU
2.5000 mg | INHALATION_SOLUTION | RESPIRATORY_TRACT | Status: DC | PRN
  Administered 2014-08-11: 2.5 mg via RESPIRATORY_TRACT
  Filled 2014-08-10: qty 3

## 2014-08-10 MED ORDER — LIDOCAINE-PRILOCAINE 2.5-2.5 % EX CREA
1.0000 | TOPICAL_CREAM | CUTANEOUS | Status: DC | PRN
Start: 2014-08-10 — End: 2014-08-14

## 2014-08-10 MED ORDER — MIDODRINE HCL 5 MG PO TABS: 5.0000 mg | ORAL_TABLET | Freq: Two times a day (BID) | ORAL | Status: DC

## 2014-08-10 MED ORDER — ALBUTEROL SULFATE (2.5 MG/3ML) 0.083% IN NEBU
5.0000 mg | INHALATION_SOLUTION | Freq: Once | RESPIRATORY_TRACT | Status: AC
  Administered 2014-08-10: 5 mg via RESPIRATORY_TRACT
  Filled 2014-08-10: qty 6

## 2014-08-10 MED ORDER — HEPARIN SODIUM (PORCINE) 5000 UNIT/ML IJ SOLN
5000.0000 [IU] | Freq: Three times a day (TID) | INTRAMUSCULAR | Status: DC
  Administered 2014-08-10 – 2014-08-14 (×10): 5000 [IU] via SUBCUTANEOUS
  Filled 2014-08-10 (×14): qty 1

## 2014-08-10 MED ORDER — METHYLPREDNISOLONE SODIUM SUCC 125 MG IJ SOLR
125.0000 mg | Freq: Once | INTRAMUSCULAR | Status: AC
  Administered 2014-08-10: 125 mg via INTRAVENOUS
  Filled 2014-08-10: qty 2

## 2014-08-10 MED ORDER — PENTAFLUOROPROP-TETRAFLUOROETH EX AERO: 1.0000 "application " | INHALATION_SPRAY | CUTANEOUS | Status: DC | PRN

## 2014-08-10 MED ORDER — HEPARIN SODIUM (PORCINE) 1000 UNIT/ML DIALYSIS
3000.0000 [IU] | Freq: Once | INTRAMUSCULAR | Status: DC
  Filled 2014-08-10: qty 3

## 2014-08-10 MED ORDER — HEPARIN SODIUM (PORCINE) 1000 UNIT/ML DIALYSIS
1000.0000 [IU] | INTRAMUSCULAR | Status: DC | PRN
  Filled 2014-08-10: qty 1

## 2014-08-10 MED ORDER — DEXTROSE 5 % IV SOLN
500.0000 mg | Freq: Once | INTRAVENOUS | Status: AC
  Administered 2014-08-10: 500 mg via INTRAVENOUS
  Filled 2014-08-10: qty 500

## 2014-08-10 MED ORDER — SODIUM CHLORIDE 0.9 % IV SOLN: 100.0000 mL | INTRAVENOUS | Status: DC | PRN

## 2014-08-10 MED ORDER — SODIUM CHLORIDE 0.9 % IV SOLN
125.0000 mg | INTRAVENOUS | Status: DC
  Administered 2014-08-12 – 2014-08-14 (×2): 125 mg via INTRAVENOUS
  Filled 2014-08-10 (×4): qty 10

## 2014-08-10 MED ORDER — ALTEPLASE 2 MG IJ SOLR
2.0000 mg | Freq: Once | INTRAMUSCULAR | Status: AC | PRN
  Filled 2014-08-10: qty 2

## 2014-08-10 MED ORDER — DOXERCALCIFEROL 4 MCG/2ML IV SOLN
1.0000 ug | INTRAVENOUS | Status: DC
  Administered 2014-08-10 – 2014-08-14 (×3): 1 ug via INTRAVENOUS
  Filled 2014-08-10 (×3): qty 2

## 2014-08-10 MED ORDER — CALCIUM ACETATE 667 MG PO CAPS: 1334.0000 mg | ORAL_CAPSULE | Freq: Three times a day (TID) | ORAL | Status: DC

## 2014-08-10 MED ORDER — INSULIN GLARGINE 100 UNIT/ML ~~LOC~~ SOLN
15.0000 [IU] | Freq: Every day | SUBCUTANEOUS | Status: DC
  Administered 2014-08-10 – 2014-08-13 (×4): 15 [IU] via SUBCUTANEOUS
  Filled 2014-08-10 (×6): qty 0.15

## 2014-08-10 MED ORDER — ONDANSETRON HCL 4 MG/2ML IJ SOLN: 4.0000 mg | Freq: Four times a day (QID) | INTRAMUSCULAR | Status: DC | PRN

## 2014-08-10 MED ORDER — IPRATROPIUM BROMIDE 0.02 % IN SOLN
0.5000 mg | RESPIRATORY_TRACT | Status: DC
  Administered 2014-08-10 – 2014-08-11 (×5): 0.5 mg via RESPIRATORY_TRACT
  Filled 2014-08-10 (×5): qty 2.5

## 2014-08-10 MED ORDER — MIDODRINE HCL 5 MG PO TABS
10.0000 mg | ORAL_TABLET | Freq: Three times a day (TID) | ORAL | Status: DC
  Administered 2014-08-10 – 2014-08-14 (×11): 10 mg via ORAL
  Filled 2014-08-10 (×16): qty 2

## 2014-08-10 MED ORDER — DOXERCALCIFEROL 4 MCG/2ML IV SOLN
INTRAVENOUS | Status: AC
  Filled 2014-08-10: qty 2

## 2014-08-10 MED ORDER — HYDROCORTISONE NA SUCCINATE PF 100 MG IJ SOLR
50.0000 mg | Freq: Four times a day (QID) | INTRAMUSCULAR | Status: DC
  Filled 2014-08-10 (×2): qty 1

## 2014-08-10 MED ORDER — LIDOCAINE HCL (PF) 1 % IJ SOLN: 5.0000 mL | INTRAMUSCULAR | Status: DC | PRN

## 2014-08-10 MED ORDER — IPRATROPIUM BROMIDE 0.02 % IN SOLN
0.5000 mg | Freq: Once | RESPIRATORY_TRACT | Status: AC
  Administered 2014-08-10: 0.5 mg via RESPIRATORY_TRACT
  Filled 2014-08-10: qty 2.5

## 2014-08-10 MED ORDER — INSULIN ASPART 100 UNIT/ML ~~LOC~~ SOLN
2.0000 [IU] | SUBCUTANEOUS | Status: DC
  Administered 2014-08-10: 4 [IU] via SUBCUTANEOUS
  Administered 2014-08-11 (×2): 2 [IU] via SUBCUTANEOUS
  Administered 2014-08-11: 4 [IU] via SUBCUTANEOUS
  Administered 2014-08-11 (×3): 2 [IU] via SUBCUTANEOUS
  Administered 2014-08-12: 4 [IU] via SUBCUTANEOUS
  Administered 2014-08-12: 2 [IU] via SUBCUTANEOUS

## 2014-08-10 NOTE — ED Notes (Signed)
Attempted piv x2.  Dr. Colin Rhein made aware and will attempt to use ultrasound.

## 2014-08-10 NOTE — ED Provider Notes (Signed)
CSN: [id number]     Arrival date & time 08/10/14  [id number] History   First MD Initiated Contact with Patient 08/10/14 [phone]     Chief Complaint  Patient presents with  . Nasal Congestion     (Consider location/radiation/quality/duration/timing/severity/associated sxs/prior Treatment) HPI Comments: Patient is a 53 year old morbidly obese female with past medical history of hypertension, diabetes, GERD, PE, SVC syndrome, COPD, end-stage renal disease on dialysis (T, TH, Sat) who presents to the emergency department complaining of shortness of breath, productive cough with yellow mucus and nasal congestion x2 days. Patient states she is short of breath both at rest and on exertion. States when she coughs the front of her chest hurts. She tried using her albuterol inhaler when she woke up this morning with no relief. Denies fever, chills, nausea or vomiting. She completed dialysis 4 days ago and is due again today. She is not oxygen dependent at home, however does have a CPAP at night. Denies any extremity edema.  The history is provided by the patient.    Past Medical History  Diagnosis Date  . HTN (hypertension)   . Obesities, morbid   . Diabetes mellitus type 2 in obese   . GERD (gastroesophageal reflux disease)   . PE (pulmonary embolism) ~ 2010  . Superior vena cava syndrome     Archie Endo 03/26/2013  . Pneumonia     "now & once a long time ago" (03/26/2013)  . Shortness of breath     "just a little bit; at any time" (03/26/2013)  . OSA on CPAP   . Iron deficiency anemia   . Chronic hypotension   . COPD (chronic obstructive pulmonary disease)   . ESRD (end stage renal disease) on dialysis     "TTS; [place]; (03/25/2014)   Past Surgical History  Procedure Laterality Date  . Vascular surgery    . Thrombectomy and revision of arterioventous (av) goretex  graft Left ~ 12/2012    "thigh"   . Arteriovenous graft placement Left ~ 2011    "thigh" (4  . Carpal tunnel release Right ~ 2011    Family History  Problem Relation Age of Onset  . Diabetes Mellitus II Mother   . Hypertension Mother   . Hypertension Father   . Diabetes Mellitus II Father    History  Substance Use Topics  . Smoking status: Never Smoker   . Smokeless tobacco: Never Used  . Alcohol Use: No   OB History   Grav Para Term Preterm Abortions TAB SAB Ect Mult Living                 Review of Systems  HENT: Positive for congestion.   Respiratory: Positive for cough and shortness of breath.   Cardiovascular: Positive for chest pain.  All other systems reviewed and are negative.     Allergies  Amoxicillin and Iohexol  Home Medications   Prior to Admission medications   Medication Sig Start Date End Date Taking? Authorizing Provider  albuterol (PROVENTIL HFA;VENTOLIN HFA) 108 (90 BASE) MCG/ACT inhaler Inhale 2 puffs into the lungs every 6 (six) hours as needed. For shortness of breath. 03/27/14   Ariyan Oats, MD  calcium acetate (PHOSLO) 667 MG capsule Take 1,334 mg by mouth 3 (three) times daily with meals.     Historical Provider, MD  insulin aspart (NOVOLOG FLEXPEN) 100 UNIT/ML injection Inject 12 Units into the skin 3 (three) times daily before meals.    Historical Provider, MD  insulin  glargine (LANTUS) 100 UNIT/ML injection Inject 0.15 mLs (15 Units total) into the skin at bedtime. 03/31/13   Geradine Girt, DO  levofloxacin (LEVAQUIN) 500 MG tablet Take 1 tablet by mouth every other day starting 4/20. 03/27/14   Berneita Oats, MD  metoCLOPramide (REGLAN) 5 MG tablet Take 5 mg by mouth at bedtime.      Historical Provider, MD  midodrine (PROAMATINE) 5 MG tablet Take 5 mg by mouth 2 (two) times daily.      Historical Provider, MD  Multiple Vitamins-Minerals (MULTIVITAMINS THER. W/MINERALS) TABS Take 1 tablet by mouth daily.      Historical Provider, MD  predniSONE (DELTASONE) 20 MG tablet Take 2 tablets (40 mg total) by mouth daily with breakfast. 03/27/14   Adeline C Viyuoh, MD   BP  121/66  Pulse 94  Temp(Src) 98.1 F (36.7 C) (Oral)  Resp 20  SpO2 92% Physical Exam  Nursing note and vitals reviewed. Constitutional: She is oriented to person, place, and time. She appears well-developed and well-nourished.  Morbidly obese.  HENT:  Head: Normocephalic and atraumatic.  Mouth/Throat: Oropharynx is clear and moist.  Eyes: Conjunctivae and EOM are normal. Pupils are equal, round, and reactive to light.  Neck: Normal range of motion. Neck supple. No JVD present.  Cardiovascular: Normal rate, regular rhythm, normal heart sounds and intact distal pulses.   No extremity edema.  Pulmonary/Chest: She is in respiratory distress (mild). She exhibits no tenderness.  Poor air movement. Scattered inspiratory and expiratory wheezes/ronchi bilateral.  Abdominal: Soft. Bowel sounds are normal. There is no tenderness.  Musculoskeletal: Normal range of motion. She exhibits no edema.  Neurological: She is alert and oriented to person, place, and time. She has normal strength. No sensory deficit.  Speech fluent, goal oriented. Moves limbs without ataxia. Equal grip strength bilateral.  Skin: Skin is warm and dry. She is not diaphoretic.  Psychiatric: She has a normal mood and affect. Her behavior is normal.    ED Course  Procedures (including critical care time) Labs Review Labs Reviewed  BASIC METABOLIC PANEL - Abnormal; Notable for the following:    Sodium 135 (*)    Chloride 94 (*)    Glucose, Bld 123 (*)    BUN 37 (*)    Creatinine, Ser 7.85 (*)    GFR calc non Af Amer 5 (*)    GFR calc Af Amer 6 (*)    Anion gap 17 (*)    All other components within normal limits  CBC - Abnormal; Notable for the following:    Hemoglobin 11.5 (*)    All other components within normal limits  PRO B NATRIURETIC PEPTIDE - Abnormal; Notable for the following:    Pro B Natriuretic peptide (BNP) 1903.0 (*)    All other components within normal limits  TROPONIN I  I-STAT TROPOININ, ED     Imaging Review Dg Chest Portable 1 View  08/10/2014   CLINICAL DATA:  Shortness of breath and congestion  EXAM: PORTABLE CHEST - 1 VIEW  COMPARISON:  03/25/2014  FINDINGS: There is diffuse interstitial coarsening with bronchial cuffing. No overt pulmonary edema. No effusion, asymmetric opacity, or pneumothorax.  Normal heart size for technique. A SVC stent is again noted. Unchanged appearance of the mildly widened upper mediastinum, accentuated by portable technique.  IMPRESSION: Diffuse interstitial coarsening which could be congestive or bronchitic.   Electronically Signed   By: Jorje Guild M.D.   On: 08/10/2014 07:15     EKG  Interpretation   Date/Time:  Tuesday August 10 2014 06:40:57 EDT Ventricular Rate:  91 PR Interval:  202 QRS Duration: 78 QT Interval:  354 QTC Calculation: 435 R Axis:   41 Text Interpretation:  Sinus rhythm Borderline prolonged PR interval Low  voltage, precordial leads No significant change was found Confirmed by  Debby Freiberg [phone]) on 08/10/2014 7:03:40 AM      MDM   Final diagnoses:  COPD exacerbation   Pt presenting with cough, sob, congestion. She is non-toxic appearing but in mild respiratory distress. O2 sat 82% on RA, increased to 95% on 4L. She is not oxygen dependent. Labs, EKG, CXR pending. Will give DuoNeb. Probable admission. 8:03 AM Kidney function is at baseline. No leukocytosis. BNP 1903. Chest x-ray showing diffuse interstitial coarsening which could be congestive or bronchitic, given patient's productive cough with purulent sputum, most likely bronchitic. Will start antibiotics for acute bronchitis and COPD exacerbation. Patient will be admitted, I will consult nephrology regarding patient's need for dialysis today. Admission accepted by Dr. Broadus John, [facility]. 8:08 AM I spoke with Dr. Jimmy Footman who will consult patient for dialysis on admission.  Illene Labrador, PA-C 08/10/14 [place], PA-C 08/10/14 [phone]

## 2014-08-10 NOTE — H&P (Signed)
[facility] History and Physical  [name] [id number] DOB: November 23, 1961 DOA: 08/10/2014  Referring physician: EDP PCP: Barbette Merino, MD   Chief Complaint: dyspnea  HPI: [name] is a 53 y.o. female with ESRD on HD, Dm , obesity, OSA, COPD, Pulm HTN presented to the ER with dyspnea today. She was felt to have COPD flare in the ER and sent to the [facility]. When i see her on 6E she is lethargic on NRM and unable to provide any history. Felt to have Volume overload based on CXR and I sent her to HD stat     Review of Systems: unable to obtain Constitutional:  No weight loss, night sweats, Fevers, chills, fatigue.  HEENT:  No headaches, Difficulty swallowing,Tooth/dental problems,Sore throat,  No sneezing, itching, ear ache, nasal congestion, post nasal drip,  Cardio-vascular:  No chest pain, Orthopnea, PND, swelling in lower extremities, anasarca, dizziness, palpitations  GI:  No heartburn, indigestion, abdominal pain, nausea, vomiting, diarrhea, change in bowel habits, loss of appetite  Resp:  No shortness of breath with exertion or at rest. No excess mucus, no productive cough, No non-productive cough, No coughing up of blood.No change in color of mucus.No wheezing.No chest wall deformity  Skin:  no rash or lesions.  GU:  no dysuria, change in color of urine, no urgency or frequency. No flank pain.  Musculoskeletal:  No joint pain or swelling. No decreased range of motion. No back pain.  Psych:  No change in mood or affect. No depression or anxiety. No memory loss.   Past Medical History  Diagnosis Date  . HTN (hypertension)   . Obesities, morbid   . Diabetes mellitus type 2 in obese   . GERD (gastroesophageal reflux disease)   . PE (pulmonary embolism) ~ 2010  . Superior vena cava syndrome     Archie Endo 03/26/2013  . Pneumonia     "now & once a long time ago" (03/26/2013)  . Shortness of breath     "just a little bit; at any time" (03/26/2013)  . OSA on  CPAP   . Iron deficiency anemia   . Chronic hypotension   . COPD (chronic obstructive pulmonary disease)   . ESRD (end stage renal disease) on dialysis     "TTS; [place]; (03/25/2014)   Past Surgical History  Procedure Laterality Date  . Vascular surgery    . Thrombectomy and revision of arterioventous (av) goretex  graft Left ~ 12/2012    "thigh"   . Arteriovenous graft placement Left ~ 2011    "thigh" (4  . Carpal tunnel release Right ~ 2011   Social History:  reports that she has never smoked. She has never used smokeless tobacco. She reports that she does not drink alcohol or use illicit drugs.  Allergies  Allergen Reactions  . Amoxicillin Anaphylaxis and Other (See Comments)    Throat closes and gets sweaty.  . Iohexol Other (See Comments)     Desc: scratchy/itchy throat/itching on back / nausea     Family History  Problem Relation Age of Onset  . Diabetes Mellitus II Mother   . Hypertension Mother   . Hypertension Father   . Diabetes Mellitus II Father      Prior to Admission medications   Medication Sig Start Date End Date Taking? Authorizing Provider  albuterol (PROVENTIL HFA;VENTOLIN HFA) 108 (90 BASE) MCG/ACT inhaler Inhale 2 puffs into the lungs every 6 (six) hours as needed. For shortness of breath. 03/27/14  Yes Adeline  Saralyn Pilar, MD  calcium acetate (PHOSLO) 667 MG capsule Take 1,334 mg by mouth 3 (three) times daily with meals.    Yes Historical Provider, MD  calcium elemental as carbonate (TUMS ULTRA 1000) 400 MG tablet Chew 1,000 mg by mouth daily.   Yes Historical Provider, MD  clonazePAM (KLONOPIN) 1 MG tablet Take 1 mg by mouth at bedtime.   Yes Historical Provider, MD  insulin aspart (NOVOLOG FLEXPEN) 100 UNIT/ML injection Inject 12-15 Units into the skin 3 (three) times daily before meals. Per sliding scale   Yes Historical Provider, MD  insulin glargine (LANTUS) 100 UNIT/ML injection Inject 0.15 mLs (15 Units total) into the skin at bedtime. 03/31/13  Yes  Geradine Girt, DO  levofloxacin (LEVAQUIN) 500 MG tablet Take 500 mg by mouth every other day.   Yes Historical Provider, MD  metoCLOPramide (REGLAN) 5 MG tablet Take 5 mg by mouth at bedtime.     Yes Historical Provider, MD  midodrine (PROAMATINE) 5 MG tablet Take 5 mg by mouth 2 (two) times daily.     Yes Historical Provider, MD  Multiple Vitamins-Minerals (MULTIVITAMINS THER. W/MINERALS) TABS Take 1 tablet by mouth daily.     Yes Historical Provider, MD  predniSONE (DELTASONE) 20 MG tablet Take 2 tablets (40 mg total) by mouth daily with breakfast. 03/27/14  Yes Timya Oats, MD   Physical Exam: Filed Vitals:   08/10/14 [id number] 08/10/14 0650 08/10/14 0830 08/10/14 0930  BP:   109/60 110/66  Pulse:   92 88  Temp: 98.1 F (36.7 C)   97.8 F (36.6 C)  TempSrc: Oral   Oral  Resp:   21 88  Weight:    129.6 kg (285 lb 11.5 oz)  SpO2:  92% 96% 100%    Wt Readings from Last 3 Encounters:  08/10/14 129.6 kg (285 lb 11.5 oz)  03/27/14 126.5 kg (278 lb 14.1 oz)  03/31/13 128.6 kg (283 lb 8.2 oz)    General:  Lethargic, minimally arousible Eyes: PERRL, normal lids, irises & conjunctiva ENT: grossly normal lips & tongue Neck: no LAD, masses or thyromegaly Cardiovascular: RRR, no m/r/g. No LE edema. Respiratory:poor air movement Abdomen: soft, ntnd Skin: no rash or induration seen on limited exam Musculoskeletal: 1 plus edema Psychiatric: unabel to assess Neurologic: lethargic          Labs on Admission:  Basic Metabolic Panel:  Recent Labs Lab 08/10/14 0703  NA 135*  K 4.1  CL 94*  CO2 24  GLUCOSE 123*  BUN 37*  CREATININE 7.85*  CALCIUM 8.7   Liver Function Tests: No results found for this basename: AST, ALT, ALKPHOS, BILITOT, PROT, ALBUMIN,  in the last 168 hours No results found for this basename: LIPASE, AMYLASE,  in the last 168 hours No results found for this basename: AMMONIA,  in the last 168 hours CBC:  Recent Labs Lab 08/10/14 0703  WBC 8.8  HGB  11.5*  HCT 37.8  MCV 93.6  PLT 173   Cardiac Enzymes:  Recent Labs Lab 08/10/14 0703  TROPONINI <0.30    BNP (last 3 results)  Recent Labs  08/10/14 0703  PROBNP 1903.0*   CBG: No results found for this basename: GLUCAP,  in the last 168 hours  Radiological Exams on Admission: Dg Chest Portable 1 View  08/10/2014   CLINICAL DATA:  Shortness of breath and congestion  EXAM: PORTABLE CHEST - 1 VIEW  COMPARISON:  03/25/2014  FINDINGS: There is diffuse interstitial coarsening with  bronchial cuffing. No overt pulmonary edema. No effusion, asymmetric opacity, or pneumothorax.  Normal heart size for technique. A SVC stent is again noted. Unchanged appearance of the mildly widened upper mediastinum, accentuated by portable technique.  IMPRESSION: Diffuse interstitial coarsening which could be congestive or bronchitic.   Electronically Signed   By: Jorje Guild M.D.   On: 08/10/2014 07:15    EKG: Independently reviewed.NSR, no acute ST T wave changes  Assessment/Plan    Acute respiratory failure with hypoxia   ESRD on hemodialysis   OSA (obstructive sleep apnea)   Anemia of chronic disease   Secondary hyperparathyroidism   Insulin-requiring or dependent type II diabetes mellitus   COPD exacerbation - Plan: ABG STAT -BIPAP STAT -Needs emergent HD-d/w Dr.deterding and charge RN and Bethena Roys in HD -PCCM consult, d/w Dr.Feinstein -Needs to be transferred to ICU and PCCM to assume care     Code Status: Full Code DVT Prophylaxis: Hep Sq Family Communication: none at bedside Disposition Plan: ICU  Time spent: 56min  Ikhlas Albo [facility] Pager [phone]  **Disclaimer: This note may have been dictated with voice recognition software. Similar sounding words can inadvertently be transcribed and this note may contain transcription errors which may not have been corrected upon publication of note.**

## 2014-08-10 NOTE — Progress Notes (Signed)
Admission note:  Arrival Method: Patient arrived in the bed with the staff member accompanying and husband from ER.   Mental Orientation:  Alert and oriented x4. Telemetry: She is on cardiac monitoring, informed CCMD. Assessment: See Doc flowsheets. Skin: Skin warm and dry and intact. IV: Peripheral IV in Left forearm. Admission Screening: Couldn't finish because patient in hemodialysis 6700 Orientation: Patient has been oriented to the unit, staff and to the room.

## 2014-08-10 NOTE — Consult Note (Signed)
[facility] Renal Consultation Note  Indication for Consultation:  Management of ESRD/hemodialysis; anemia, hypertension/volume and secondary hyperparathyroidism  HPI: [name] is a 53 y.o. female with a history of Diabetes Type 2, morbid obesity, COPD, PE in 2010, obstructive sleep apnea on CPAP, and ESRD on dialysis at the [facility] who reported increased nasal congestion and coughing with occasional sputum for 2-3 days before severe dyspnea this morning, when she was sent from the dialysis center to the ED for evaluation.  She reported no relief with her albuterol inhaler and mild chest pain with cough, but denies any fever, chills, nausea, or vomiting.  Her chest x-ray showed diffuse interstitial coarsening, possibly congestive or bronchitic, but no overt pulmonary edema.  She is compliant with dialysis and usually reaches her end dry weight goal.  Dialysis Orders:  TTS @ [place]  4:45     127.5 kg      2K/2.5Ca      400/800      L thigh AVG       Heparin 3000 U, 1000 U mid-HD Hectorol 1 mcg        No Aranesp or Venofer  Past Medical History  Diagnosis Date  . HTN (hypertension)   . Obesities, morbid   . Diabetes mellitus type 2 in obese   . GERD (gastroesophageal reflux disease)   . PE (pulmonary embolism) ~ 2010  . Superior vena cava syndrome     Archie Endo 03/26/2013  . Pneumonia     "now & once a long time ago" (03/26/2013)  . Shortness of breath     "just a little bit; at any time" (03/26/2013)  . OSA on CPAP   . Iron deficiency anemia   . Chronic hypotension   . COPD (chronic obstructive pulmonary disease)   . ESRD (end stage renal disease) on dialysis     "TTS; [place]; (03/25/2014)   Past Surgical History  Procedure Laterality Date  . Vascular surgery    . Thrombectomy and revision of arterioventous (av) goretex  graft Left ~ 12/2012    "thigh"   . Arteriovenous graft placement Left ~ 2011    "thigh" (4  . Carpal tunnel release Right  ~ 2011   Family History  Problem Relation Age of Onset  . Diabetes Mellitus II Mother   . Hypertension Mother   . Hypertension Father   . Diabetes Mellitus II Father    Social History She denies any history of tobacco, alcohol, or illicit drug use.  She is married and has two daughters.  Allergies  Allergen Reactions  . Amoxicillin Anaphylaxis and Other (See Comments)    Throat closes and gets sweaty.  . Iohexol Other (See Comments)     Desc: scratchy/itchy throat/itching on back / nausea    Prior to Admission medications   Medication Sig Start Date End Date Taking? Authorizing Provider  albuterol (PROVENTIL HFA;VENTOLIN HFA) 108 (90 BASE) MCG/ACT inhaler Inhale 2 puffs into the lungs every 6 (six) hours as needed. For shortness of breath. 03/27/14  Yes Adeline Saralyn Pilar, MD  calcium acetate (PHOSLO) 667 MG capsule Take 1,334 mg by mouth 3 (three) times daily with meals.    Yes Historical Provider, MD  calcium elemental as carbonate (TUMS ULTRA 1000) 400 MG tablet Chew 1,000 mg by mouth daily.   Yes Historical Provider, MD  clonazePAM (KLONOPIN) 1 MG tablet Take 1 mg by mouth at bedtime.   Yes Historical Provider, MD  insulin  aspart (NOVOLOG FLEXPEN) 100 UNIT/ML injection Inject 12-15 Units into the skin 3 (three) times daily before meals. Per sliding scale   Yes Historical Provider, MD  insulin glargine (LANTUS) 100 UNIT/ML injection Inject 0.15 mLs (15 Units total) into the skin at bedtime. 03/31/13  Yes Geradine Girt, DO  levofloxacin (LEVAQUIN) 500 MG tablet Take 500 mg by mouth every other day.   Yes Historical Provider, MD  metoCLOPramide (REGLAN) 5 MG tablet Take 5 mg by mouth at bedtime.     Yes Historical Provider, MD  midodrine (PROAMATINE) 5 MG tablet Take 5 mg by mouth 2 (two) times daily.     Yes Historical Provider, MD  Multiple Vitamins-Minerals (MULTIVITAMINS THER. W/MINERALS) TABS Take 1 tablet by mouth daily.     Yes Historical Provider, MD  predniSONE (DELTASONE) 20  MG tablet Take 2 tablets (40 mg total) by mouth daily with breakfast. 03/27/14  Yes Katrin Oats, MD   Labs:  Results for orders placed during the hospital encounter of 08/10/14 (from the past 48 hour(s))  BASIC METABOLIC PANEL     Status: Abnormal   Collection Time    08/10/14  7:03 AM      Result Value Ref Range   Sodium 135 (*) 137 - 147 mEq/L   Potassium 4.1  3.7 - 5.3 mEq/L   Chloride 94 (*) 96 - 112 mEq/L   CO2 24  19 - 32 mEq/L   Glucose, Bld 123 (*) 70 - 99 mg/dL   BUN 37 (*) 6 - 23 mg/dL   Creatinine, Ser 7.85 (*) 0.50 - 1.10 mg/dL   Calcium 8.7  8.4 - 10.5 mg/dL   GFR calc non Af Amer 5 (*) >90 mL/min   GFR calc Af Amer 6 (*) >90 mL/min   Comment: (NOTE)     The eGFR has been calculated using the CKD EPI equation.     This calculation has not been validated in all clinical situations.     eGFR's persistently <90 mL/min signify possible Chronic Kidney     Disease.   Anion gap 17 (*) 5 - 15  CBC     Status: Abnormal   Collection Time    08/10/14  7:03 AM      Result Value Ref Range   WBC 8.8  4.0 - 10.5 K/uL   RBC 4.04  3.87 - 5.11 MIL/uL   Hemoglobin 11.5 (*) 12.0 - 15.0 g/dL   HCT 37.8  36.0 - 46.0 %   MCV 93.6  78.0 - 100.0 fL   MCH 28.5  26.0 - 34.0 pg   MCHC 30.4  30.0 - 36.0 g/dL   RDW 14.3  11.5 - 15.5 %   Platelets 173  150 - 400 K/uL  TROPONIN I     Status: None   Collection Time    08/10/14  7:03 AM      Result Value Ref Range   Troponin I <0.30  <0.30 ng/mL   Comment:            Due to the release kinetics of cTnI,     a negative result within the first hours     of the onset of symptoms does not rule out     myocardial infarction with certainty.     If myocardial infarction is still suspected,     repeat the test at appropriate intervals.  PRO B NATRIURETIC PEPTIDE     Status: Abnormal   Collection Time  08/10/14  7:03 AM      Result Value Ref Range   Pro B Natriuretic peptide (BNP) 1903.0 (*) 0 - 125 pg/mL  I-STAT TROPOININ, ED      Status: None   Collection Time    08/10/14  7:06 AM      Result Value Ref Range   Troponin i, poc 0.00  0.00 - 0.08 ng/mL   Comment 3            Comment: Due to the release kinetics of cTnI,     a negative result within the first hours     of the onset of symptoms does not rule out     myocardial infarction with certainty.     If myocardial infarction is still suspected,     repeat the test at appropriate intervals.   Constitutional: negative for chills, fatigue, fevers and sweats Ears, nose, mouth, throat, and face: positive for nasal congestion, negative for earaches, hoarseness and sore throat Respiratory: positive for cough, dyspnea on exertion, pleurisy/chest pain and sputum, negative for hemoptysis Cardiovascular: positive for dyspnea, negative for chest pressure/discomfort, orthopnea and palpitations Gastrointestinal: negative for abdominal pain, change in bowel habits, nausea and vomiting Genitourinary:negative, anuric Musculoskeletal:negative for arthralgias, back pain, myalgias and neck pain Neurological: negative for dizziness, gait problems, headaches and paresthesia  Physical Exam: Filed Vitals:   08/10/14 0930  BP: 110/66  Pulse: 88  Temp: 97.8 F (36.6 C)  Resp: 88     General appearance: on nebulizer, lethargic, but cooperative, in mild distress  1 h later lethargic and difficult to arrouse, immed falls asleep.   Head: Normocephalic, without obvious abnormality, atraumatic Neck: no adenopathy, no carotid bruit, no JVD and supple, symmetrical, trachea midline, PCL  Prom veins c/w SVC syndrom Resp: bilateral expiratory & inspriatory wheezes poor air movement, use of accessory ms. Cardio: RRR with Gr II/VI systolic murmur, no rub GI: soft, non-tender; bowel sounds normal; no masses,  no organomegaly, massive obese, liver down 6 cm Extremities: trace edema, atraumatic, no cyanosis Neurologic: Grossly normal Now cannot get to cooperate.   Dialysis Access: L thigh AVG  with + bruit   Assessment/Plan: Acute respiratory distress - likely bronchitis, COPD exacerbation, no overt edema per CXR; on inhaler & prednisone @ home. Most of event is COPD,  Severe CO2 retention. NEEDs BIPAP.  Concern may need entub.   1. ESRD - HD on TTS @ [place], K 4.1.  HD pending. Will do what we can with vol. 2. Hypertension/volume - BP 110/66 on Midodrine 10 mg tid; 2.1 L over EDW, no overt edema per CXR. 3. Anemia - Hgb 11.5, last T-sat 20%; no Aranesp or Venofer. 4. Metabolic bone disease - Ca 8.7, last P 4.6, iPTH 17 s/p parathyroidectomy; Hectorol 1 mcg, Phoslo 3 with meals. 5. Nutrition - Alb 4.4, renal diet, multivitamin. 6. DM Type 2 - insulin per primary. 7. OSA - on CPAP @ home. 8. Massive obesity  9. Hx PE - not taking Coumadin.  LYLES,CHARLES 08/10/2014, 10:04 AM I have seen and examined this patient and agree with the plan of care seen , eval, examined.  Needs BIPAP and if not better after HD, to ICU  Yoltzin Ransom L 08/10/2014, 1:11 PM   Attending Nephrologist: Mauricia Area, MD

## 2014-08-10 NOTE — ED Notes (Signed)
EDP at bedside to initiate ultrasound IV start.

## 2014-08-10 NOTE — ED Notes (Addendum)
Congestion, cough, chest wall pain since Friday. At dialysis, but not done today. Productive cough - yellow.

## 2014-08-10 NOTE — Consult Note (Signed)
PULMONARY / CRITICAL CARE MEDICINE   Name: [name] MRN: [id number] DOB: 23-Nov-1961    ADMISSION DATE:  08/10/2014 CONSULTATION DATE:  08/10/2014  REFERRING MD :  Broadus John  CHIEF COMPLAINT:  SOB  INITIAL PRESENTATION: 53 year old female with ESRD on HD presented to ED 9/1 c/o severe dyspnea and productive coughx 2 days. In ED she was in mild respiratory distress. She was admitted under [facility]. Later that day to scheduled dialysis and became acutely SOB again. PCCM asked to see.   STUDIES:   SIGNIFICANT EVENTS: 9/1 admitted, to SDU with acute respiratory failure. Directly to HD unresponsive, acidotic  HISTORY OF PRESENT ILLNESS:  53 yo morbidly obese female with past medical history of hypertension, diabetes, GERD, PE, SVC syndrome, COPD, end-stage renal disease on dialysis (T, TH, Sat) who presents to the emergency department complaining of shortness of breath, productive cough with yellow mucus and nasal congestion x2 days. She was admitted under [place] and went for dialysis. While at HD she acutely became SOB again and was placed on BiPAP. PCCM asked to see.    PAST MEDICAL HISTORY :  Past Medical History  Diagnosis Date  . HTN (hypertension)   . Obesities, morbid   . Diabetes mellitus type 2 in obese   . GERD (gastroesophageal reflux disease)   . PE (pulmonary embolism) ~ 2010  . Superior vena cava syndrome     Archie Endo 03/26/2013  . Pneumonia     "now & once a long time ago" (03/26/2013)  . Shortness of breath     "just a little bit; at any time" (03/26/2013)  . OSA on CPAP   . Iron deficiency anemia   . Chronic hypotension   . COPD (chronic obstructive pulmonary disease)   . ESRD (end stage renal disease) on dialysis     "TTS; [place]; (03/25/2014)   Past Surgical History  Procedure Laterality Date  . Vascular surgery    . Thrombectomy and revision of arterioventous (av) goretex  graft Left ~ 12/2012    "thigh"   . Arteriovenous graft placement Left ~ 2011    "thigh" (4   . Carpal tunnel release Right ~ 2011   Prior to Admission medications   Medication Sig Start Date End Date Taking? Authorizing Provider  albuterol (PROVENTIL HFA;VENTOLIN HFA) 108 (90 BASE) MCG/ACT inhaler Inhale 2 puffs into the lungs every 6 (six) hours as needed. For shortness of breath. 03/27/14  Yes Adeline Saralyn Pilar, MD  calcium acetate (PHOSLO) 667 MG capsule Take 1,334 mg by mouth 3 (three) times daily with meals.    Yes Historical Provider, MD  calcium elemental as carbonate (TUMS ULTRA 1000) 400 MG tablet Chew 1,000 mg by mouth daily.   Yes Historical Provider, MD  clonazePAM (KLONOPIN) 1 MG tablet Take 1 mg by mouth at bedtime.   Yes Historical Provider, MD  insulin aspart (NOVOLOG FLEXPEN) 100 UNIT/ML injection Inject 12-15 Units into the skin 3 (three) times daily before meals. Per sliding scale   Yes Historical Provider, MD  insulin glargine (LANTUS) 100 UNIT/ML injection Inject 0.15 mLs (15 Units total) into the skin at bedtime. 03/31/13  Yes Geradine Girt, DO  levofloxacin (LEVAQUIN) 500 MG tablet Take 500 mg by mouth every other day.   Yes Historical Provider, MD  metoCLOPramide (REGLAN) 5 MG tablet Take 5 mg by mouth at bedtime.     Yes Historical Provider, MD  midodrine (PROAMATINE) 5 MG tablet Take 5 mg by mouth 2 (two)  times daily.     Yes Historical Provider, MD  Multiple Vitamins-Minerals (MULTIVITAMINS THER. W/MINERALS) TABS Take 1 tablet by mouth daily.     Yes Historical Provider, MD  predniSONE (DELTASONE) 20 MG tablet Take 2 tablets (40 mg total) by mouth daily with breakfast. 03/27/14  Yes Rukiya Oats, MD   Allergies  Allergen Reactions  . Amoxicillin Anaphylaxis and Other (See Comments)    Throat closes and gets sweaty.  . Iohexol Other (See Comments)     Desc: scratchy/itchy throat/itching on back / nausea     FAMILY HISTORY:  Family History  Problem Relation Age of Onset  . Diabetes Mellitus II Mother   . Hypertension Mother   . Hypertension Father    . Diabetes Mellitus II Father    SOCIAL HISTORY:  reports that she has never smoked. She has never used smokeless tobacco. She reports that she does not drink alcohol or use illicit drugs.  REVIEW OF SYSTEMS:   Bolds are positive  Constitutional: weight loss, gain, night sweats, Fevers, chills, fatigue .  HEENT: headaches, Sore throat, sneezing, nasal congestion, post nasal drip, Difficulty swallowing, Tooth/dental problems, visual complaints visual changes, ear ache CV:  chest pain, radiates: ,Orthopnea, PND, swelling in lower extremities, dizziness, palpitations, syncope.  GI  heartburn, indigestion, abdominal pain, nausea, vomiting, diarrhea, change in bowel habits, loss of appetite, bloody stools.  Resp: cough, productive: , hemoptysis, dyspnea, chest pain, pleuritic.  Skin: rash or itching or icterus GU: dysuria, change in color of urine, urgency or frequency. flank pain, hematuria  MS: joint pain or swelling. decreased range of motion  Psych: change in mood or affect. depression or anxiety.  Neuro: difficulty with speech, weakness, numbness, ataxia    SUBJECTIVE:   VITAL SIGNS: Temp:  [97.8 F (36.6 C)-98.1 F (36.7 C)] 98.1 F (36.7 C) (09/01 1140) Pulse Rate:  [75-99] 75 (09/01 1530) Resp:  [16-88] 16 (09/01 1530) BP: (100-123)/(53-69) 100/53 mmHg (09/01 1530) SpO2:  [92 %-100 %] 95 % (09/01 1530) Weight:  [129.6 kg (285 lb 11.5 oz)-130.1 kg (286 lb 13.1 oz)] 130.1 kg (286 lb 13.1 oz) (09/01 1140) HEMODYNAMICS:   VENTILATOR SETTINGS:   INTAKE / OUTPUT:  Intake/Output Summary (Last 24 hours) at 08/10/14 1615 Last data filed at 08/10/14 1300  Gross per 24 hour  Intake      0 ml  Output      0 ml  Net      0 ml    PHYSICAL EXAMINATION: General:  Obese female in mild distress on BiPAP Neuro:  Alert, oriented HEENT:  [place]/AT, PERRL, JVD difficult to assess due to nick girth Cardiovascular:  RRR Lungs:  Mildly labored on BiPAP, diffusely coarse breath sounds.   Abdomen:  Rotund, soft, non-distended, non-tender Musculoskeletal:  No acute deformity Skin:  Intact  LABS:  CBC  Recent Labs Lab 08/10/14 0703  WBC 8.8  HGB 11.5*  HCT 37.8  PLT 173   Coag's No results found for this basename: APTT, INR,  in the last 168 hours BMET  Recent Labs Lab 08/10/14 0703 08/10/14 1210  NA 135* 139  K 4.1 4.5  CL 94* 96  CO2 24 27  BUN 37* 33*  CREATININE 7.85* 7.16*  GLUCOSE 123* 151*   Electrolytes  Recent Labs Lab 08/10/14 0703 08/10/14 1210  CALCIUM 8.7 8.8  PHOS  --  5.8*   Sepsis Markers No results found for this basename: LATICACIDVEN, PROCALCITON, O2SATVEN,  in the last 168 hours ABG  Recent Labs Lab 08/10/14 1252  PHART 7.060*  PCO2ART 105.0*  PO2ART 93.1   Liver Enzymes  Recent Labs Lab 08/10/14 1210  ALBUMIN 4.2   Cardiac Enzymes  Recent Labs Lab 08/10/14 0703  TROPONINI <0.30  PROBNP 1903.0*   Glucose No results found for this basename: GLUCAP,  in the last 168 hours  Imaging No results found. CXR 9/1 - Diffuse interstitial coarsening which could be congestive or bronchitic.  ASSESSMENT / PLAN:  PULMONARY A: Acute on chronic hypercarbic respiratory failure Probably pulmonary edema COPD without evidence of acute exacerbation OSA on nocturnal CPAP  P:   Continuous BiPAP, lower EPAP, add a granted rate, may need to transition to NIMV PC Post HD would assess ability to off NIMV for 30 min Repeat ABG CXR in am Pulmonary hygiene Hemodialysis for volume removal Patient wishes for DNI, NO CODE BLUE moves air ok, re evaluate after fluid removal role escalation steroids BDers  CARDIOVASCULAR A:  SVC syndrome  H/o Hypotension  P:  HD for volume removal Continue preadmission midodrine Echo  RENAL A:   ESRD on HD (TTS)-baseline creat 7  P:   Management per renal - goal neg 3 liters? Trend Bmet  GASTROINTESTINAL A:   GERD  P:   IV PPI NPO while on BiPAP  continuosly  HEMATOLOGIC A:   Anemia  P:  Trend CBC Heparin for DVT ppx  INFECTIOUS A:  ?CAP/bronchitis  P:   Continue empiric antibiotics for now Abx: azithromycin, start date 9/1, day 1/x Abx: levaquin, start date 9/1, day 1/x Trend PCT to hopefully limit ABX exposure Not impressed with infection  ENDOCRINE A:   Diabetes  Chronic prednisone use  P:   ssi protocol  Stress dose steroids (Solu-cortef 50 q 6) If post HD, not moving air or improved, may need change to solumedral  NEUROLOGIC A:   No acute issues P:   RASS goal: 0 Supportive care   Global: Discussion with patient  (alert now orienteed x4) yields that should her respiratory status worsen, she would not want to be intubated. She also would not want cardiovascular resuscitation should she require it.    Georgann Housekeeper, ACNP [facility] Pager [phone] or [phone]   I have personally obtained a history, examined the patient, evaluated laboratory and imaging results, formulated the assessment and plan and placed orders. CRITICAL CARE: The patient is critically ill with multiple organ systems failure and requires high complexity decision making for assessment and support, frequent evaluation and titration of therapies, application of advanced monitoring technologies and extensive interpretation of multiple databases. Critical Care Time devoted to patient care services described in this note is 40 minutes.   Lavon Paganini. Titus Mould, MD, [place] Pgr: [facility]

## 2014-08-10 NOTE — ED Notes (Signed)
Pt placed on NRB mask at 10L per minute, O2 saturation level now at 96%.

## 2014-08-10 NOTE — Progress Notes (Addendum)
Patient is transferring to step down for BiPAP.    Patient transferred to ICU 24M -13.

## 2014-08-10 NOTE — Procedures (Signed)
I was present at this session.  I have reviewed the session itself and made appropriate changes.  HD via L groin avg. BP ok.  Very lethargic  Dorri Ozturk L 9/1/20151:05 PM

## 2014-08-11 ENCOUNTER — Encounter (HOSPITAL_COMMUNITY): Payer: Self-pay | Admitting: General Practice

## 2014-08-11 ENCOUNTER — Inpatient Hospital Stay (HOSPITAL_COMMUNITY): Payer: Medicare Other

## 2014-08-11 DIAGNOSIS — I369 Nonrheumatic tricuspid valve disorder, unspecified: Secondary | ICD-10-CM

## 2014-08-11 DIAGNOSIS — D638 Anemia in other chronic diseases classified elsewhere: Secondary | ICD-10-CM

## 2014-08-11 LAB — COMPREHENSIVE METABOLIC PANEL
ALT: 15 U/L (ref 0–35)
ANION GAP: 18 — AB (ref 5–15)
AST: 25 U/L (ref 0–37)
Albumin: 3.5 g/dL (ref 3.5–5.2)
Alkaline Phosphatase: 188 U/L — ABNORMAL HIGH (ref 39–117)
BILIRUBIN TOTAL: 0.4 mg/dL (ref 0.3–1.2)
BUN: 24 mg/dL — AB (ref 6–23)
CALCIUM: 8 mg/dL — AB (ref 8.4–10.5)
CHLORIDE: 94 meq/L — AB (ref 96–112)
CO2: 23 mEq/L (ref 19–32)
Creatinine, Ser: 5.05 mg/dL — ABNORMAL HIGH (ref 0.50–1.10)
GFR calc non Af Amer: 9 mL/min — ABNORMAL LOW (ref >90)
GFR, EST AFRICAN AMERICAN: 10 mL/min — AB (ref >90)
Glucose, Bld: 132 mg/dL — ABNORMAL HIGH (ref 70–99)
Potassium: 5.1 mEq/L (ref 3.7–5.3)
Sodium: 135 mEq/L — ABNORMAL LOW (ref 137–147)
Total Protein: 8.3 g/dL (ref 6.0–8.3)

## 2014-08-11 LAB — GLUCOSE, CAPILLARY
GLUCOSE-CAPILLARY: 137 mg/dL — AB (ref 70–99)
GLUCOSE-CAPILLARY: 136 mg/dL — AB (ref 70–99)
GLUCOSE-CAPILLARY: 124 mg/dL — AB (ref 70–99)
GLUCOSE-CAPILLARY: 152 mg/dL — AB (ref 70–99)
GLUCOSE-CAPILLARY: 139 mg/dL — AB (ref 70–99)
Glucose-Capillary: 145 mg/dL — ABNORMAL HIGH (ref 70–99)

## 2014-08-11 LAB — CBC
HEMATOCRIT: 37.5 % (ref 36.0–46.0)
Hemoglobin: 11.4 g/dL — ABNORMAL LOW (ref 12.0–15.0)
MCH: 28.2 pg (ref 26.0–34.0)
MCHC: 30.4 g/dL (ref 30.0–36.0)
MCV: 92.8 fL (ref 78.0–100.0)
Platelets: 196 10*3/uL (ref 150–400)
RBC: 4.04 MIL/uL (ref 3.87–5.11)
RDW: 14.5 % (ref 11.5–15.5)
WBC: 10.2 10*3/uL (ref 4.0–10.5)

## 2014-08-11 LAB — TROPONIN I
Troponin I: <0.3 ng/mL (ref <0.30)
Troponin I: <0.3 ng/mL (ref <0.30)

## 2014-08-11 IMAGING — CR DG CHEST 2V
2 series · 2 of 2 positions shown · non-contrast
Comparison: None.

CLINICAL DATA: Shortness of breath and cough.

CHEST - 2 VIEW

[w chest lat]
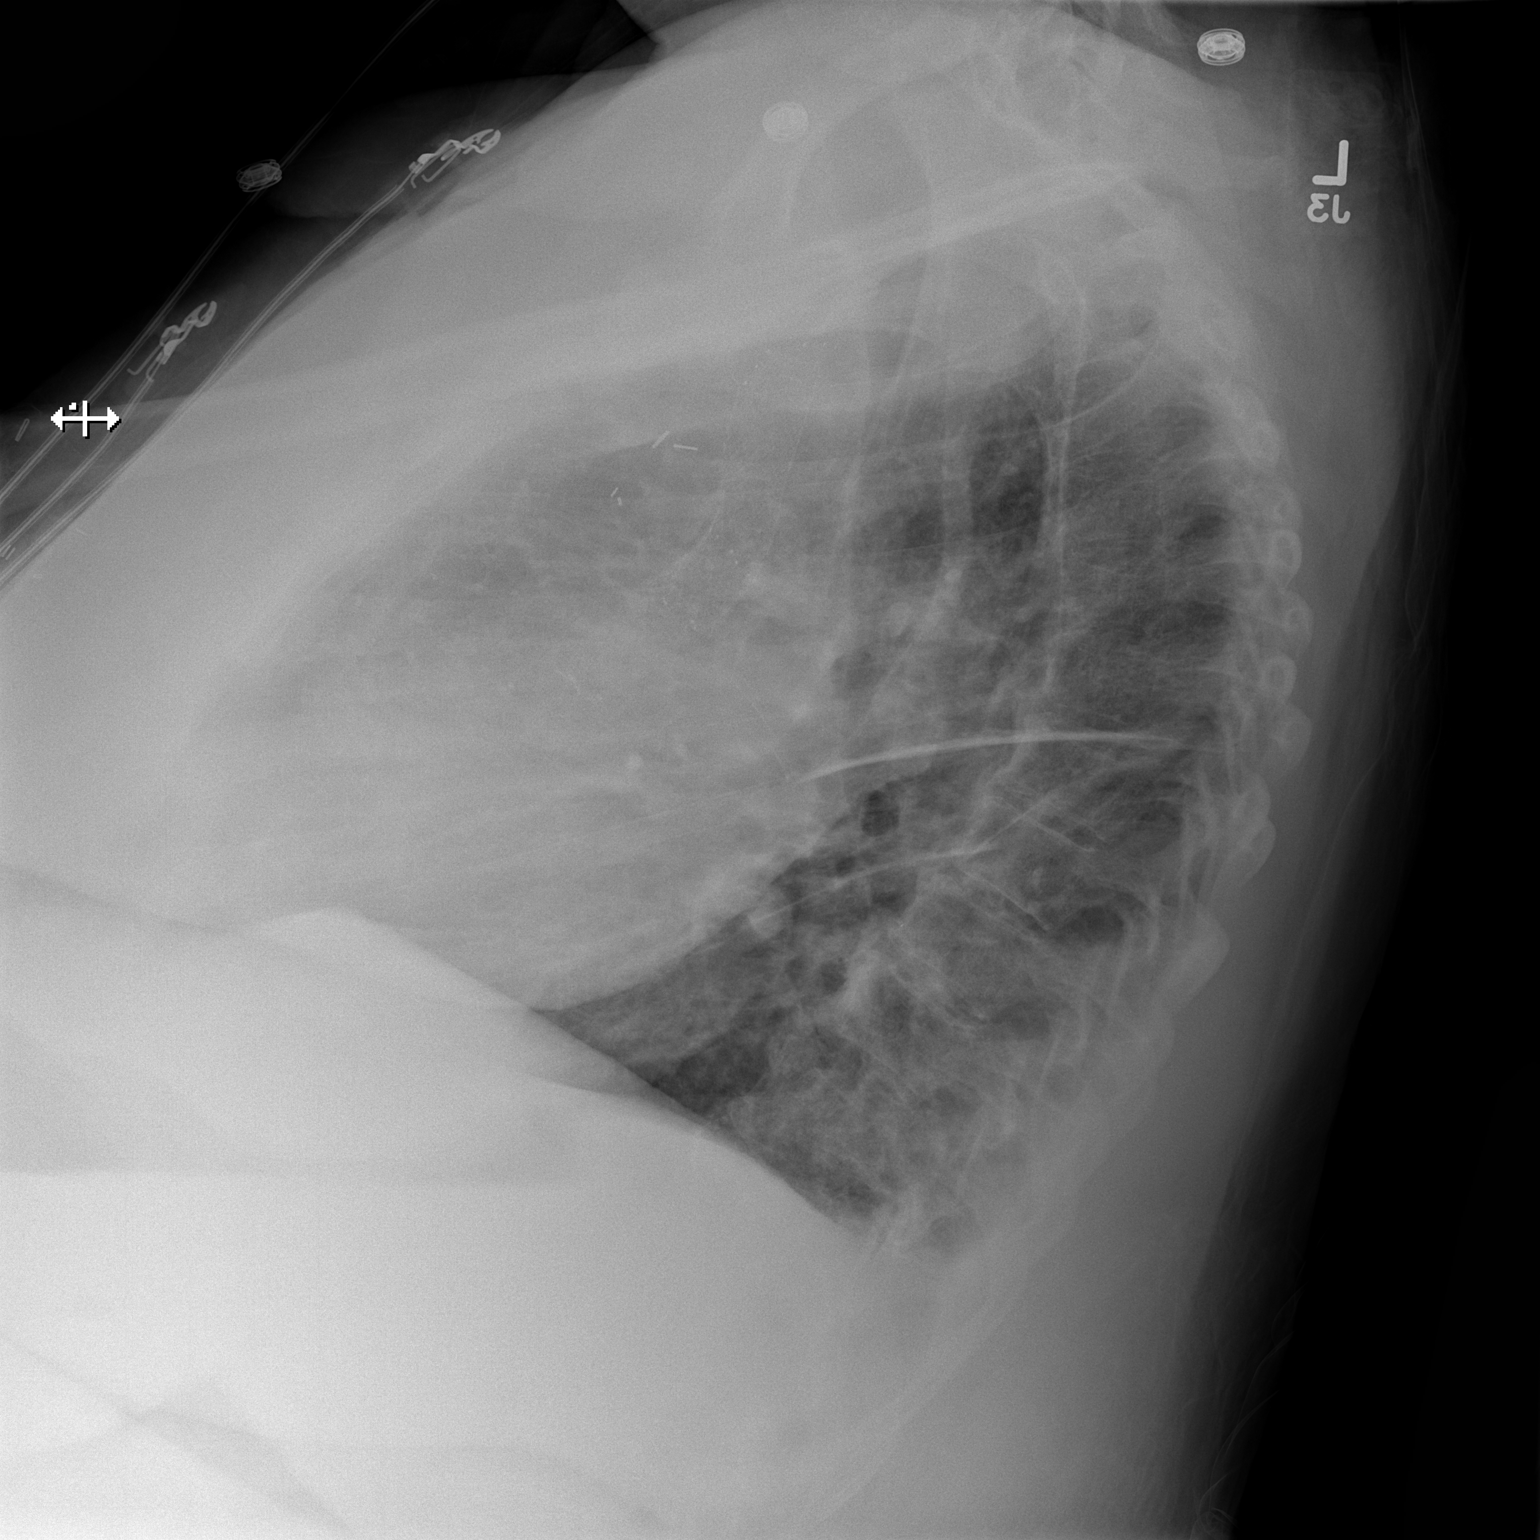

[x chest ap]
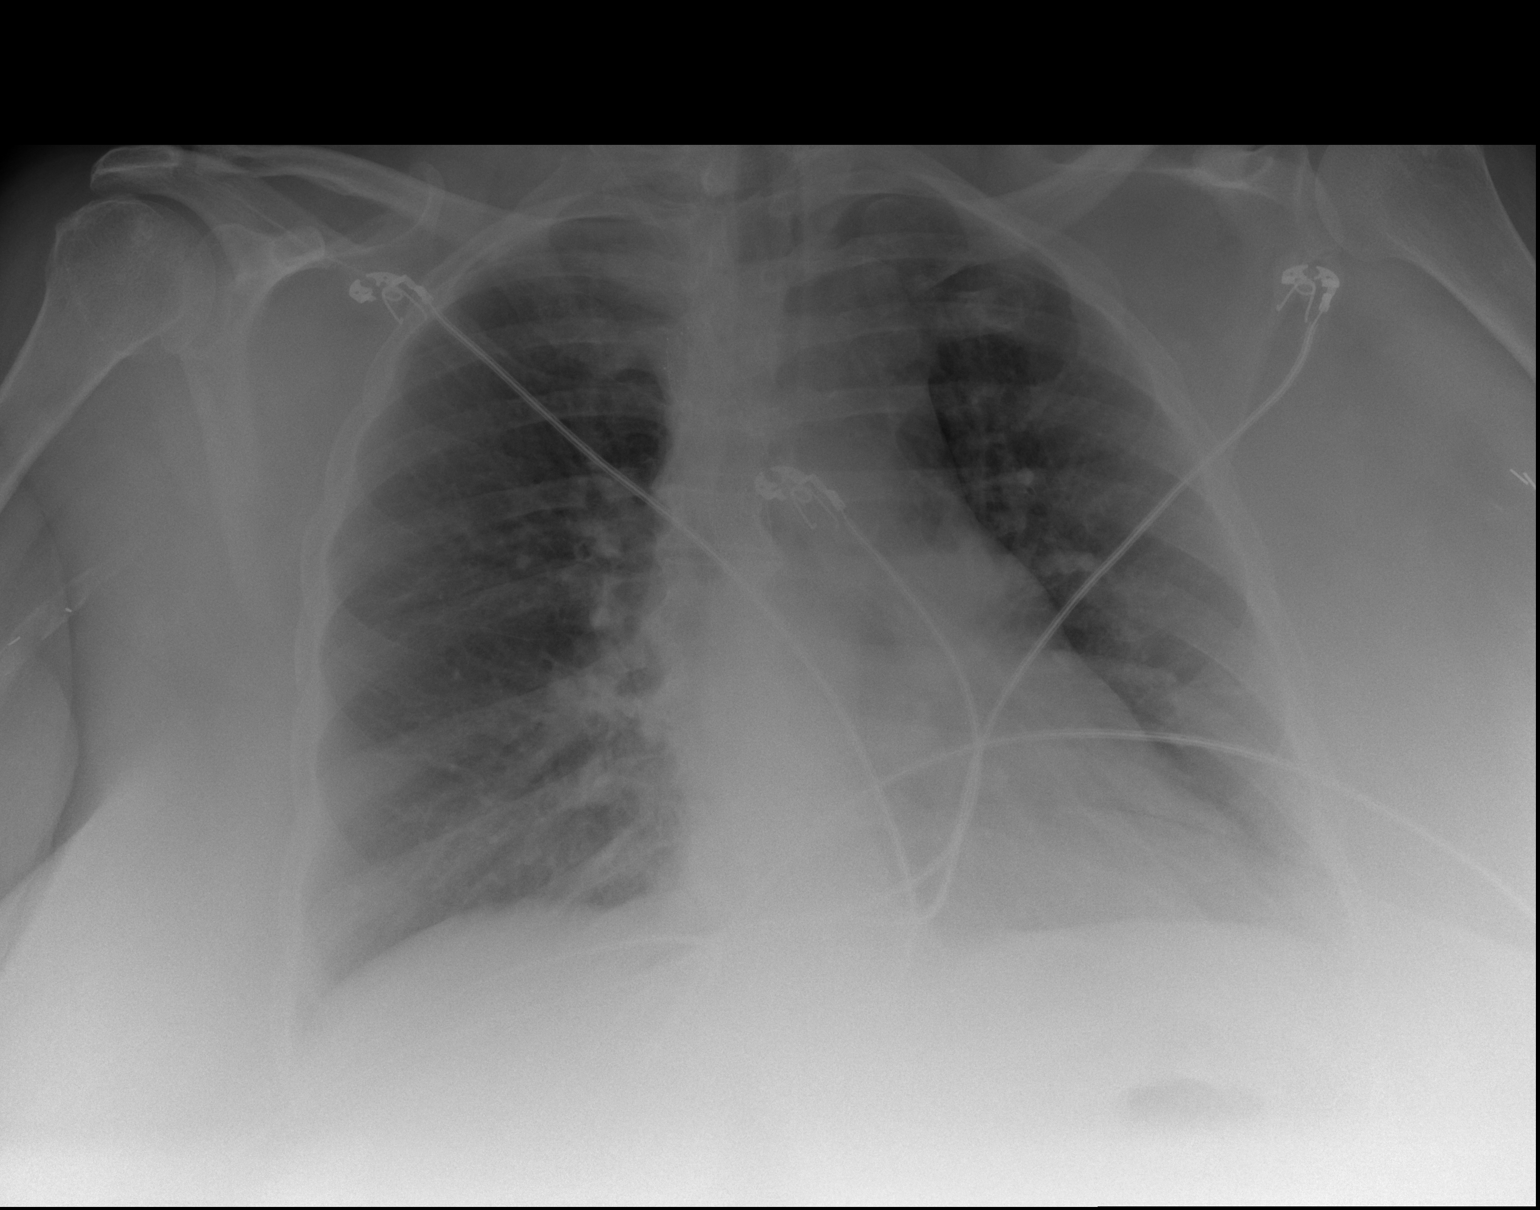

[2 of 2 positions shown; findings below may reference images not displayed]

FINDINGS: Heart size is upper normal.  There is pulmonary vascular
congestion without frank edema.  No focal airspace disease.  Trace
pleural effusions noted.
IMPRESSION: Trace bilateral pleural effusions and pulmonary vascular
congestion.

## 2014-08-11 MED ORDER — IPRATROPIUM-ALBUTEROL 0.5-2.5 (3) MG/3ML IN SOLN
3.0000 mL | Freq: Four times a day (QID) | RESPIRATORY_TRACT | Status: DC
  Administered 2014-08-11 – 2014-08-13 (×7): 3 mL via RESPIRATORY_TRACT
  Filled 2014-08-11 (×7): qty 3

## 2014-08-11 MED ORDER — DARBEPOETIN ALFA-POLYSORBATE 60 MCG/0.3ML IJ SOLN: 60.0000 ug | INTRAMUSCULAR | Status: DC

## 2014-08-11 MED ORDER — IPRATROPIUM BROMIDE 0.02 % IN SOLN: 0.5000 mg | Freq: Four times a day (QID) | RESPIRATORY_TRACT | Status: DC

## 2014-08-11 MED ORDER — PANTOPRAZOLE SODIUM 40 MG PO TBEC
40.0000 mg | DELAYED_RELEASE_TABLET | Freq: Every day | ORAL | Status: DC
  Administered 2014-08-11 – 2014-08-14 (×4): 40 mg via ORAL
  Filled 2014-08-11 (×4): qty 1

## 2014-08-11 MED ORDER — METHYLPREDNISOLONE SODIUM SUCC 40 MG IJ SOLR
40.0000 mg | Freq: Two times a day (BID) | INTRAMUSCULAR | Status: DC
  Administered 2014-08-11 – 2014-08-13 (×3): 40 mg via INTRAVENOUS
  Filled 2014-08-11 (×6): qty 1

## 2014-08-11 MED ORDER — IPRATROPIUM-ALBUTEROL 0.5-2.5 (3) MG/3ML IN SOLN: 3.0000 mL | RESPIRATORY_TRACT | Status: DC

## 2014-08-11 NOTE — Progress Notes (Signed)
Subjective: Interval History: has no complaint, feeling better.  Objective: Vital signs in last 24 hours: Temp:  [96.5 F (35.8 C)-98.2 F (36.8 C)] 98.2 F (36.8 C) (09/02 0432) Pulse Rate:  [67-99] 67 (09/02 0432) Resp:  [15-88] 20 (09/02 0432) BP: (85-123)/(44-69) 102/44 mmHg (09/02 0432) SpO2:  [92 %-100 %] 95 % (09/02 0432) FiO2 (%):  [60 %] 60 % (09/02 0432) Weight:  [126.7 kg (279 lb 5.2 oz)-130.1 kg (286 lb 13.1 oz)] 127 kg (279 lb 15.8 oz) (09/02 0432) Weight change:   Intake/Output from previous day: 09/01 0701 - 09/02 0700 In: 0  Out: 3219  Intake/Output this shift:    General appearance: alert, cooperative, no distress, morbidly obese and on BIPAP Resp: diminished breath sounds bilaterally, rhonchi bilaterally and wheezes bilaterally Cardio: S1, S2 normal and systolic murmur: holosystolic 2/6, blowing at apex GI: obese, liver down 6 cm, pos bs, soft Extremities: AVG L groin B&T  Lab Results:  Recent Labs  08/10/14 0703 08/11/14 0138  WBC 8.8 10.2  HGB 11.5* 11.4*  HCT 37.8 37.5  PLT 173 196   BMET:  Recent Labs  08/10/14 1210 08/11/14 0138  NA 139 135*  K 4.5 5.1  CL 96 94*  CO2 27 23  GLUCOSE 151* 132*  BUN 33* 24*  CREATININE 7.16* 5.05*  CALCIUM 8.8 8.0*   No results found for this basename: PTH,  in the last 72 hours Iron Studies: No results found for this basename: IRON, TIBC, TRANSFERRIN, FERRITIN,  in the last 72 hours  Studies/Results: Dg Chest Port 1 View  08/11/2014   CLINICAL DATA:  Shortness of breath.  EXAM: PORTABLE CHEST - 1 VIEW  COMPARISON:  Single view of the chest 08/10/2014. And lateral chest 03/25/2014.  FINDINGS: There is cardiomegaly and mild interstitial edema. Discoid atelectasis is seen in the right mid lung zone and left lung base. No pneumothorax or pleural effusion is identified.  IMPRESSION: Cardiomegaly with mild interstitial edema and scattered atelectasis.   Electronically Signed   By: Inge Rise M.D.   On:  08/11/2014 07:10   Dg Chest Portable 1 View  08/10/2014   CLINICAL DATA:  Shortness of breath and congestion  EXAM: PORTABLE CHEST - 1 VIEW  COMPARISON:  03/25/2014  FINDINGS: There is diffuse interstitial coarsening with bronchial cuffing. No overt pulmonary edema. No effusion, asymmetric opacity, or pneumothorax.  Normal heart size for technique. A SVC stent is again noted. Unchanged appearance of the mildly widened upper mediastinum, accentuated by portable technique.  IMPRESSION: Diffuse interstitial coarsening which could be congestive or bronchitic.   Electronically Signed   By: Jorje Guild M.D.   On: 08/10/2014 07:15    I have reviewed the patient's current medications.  Assessment/Plan: 1 ESRD  Mild vol xs, HD in am as sched 2 COPD flare primary issue improving 3 OSA 4 Massive obesity 5 DM controlled but on steroids 6 Anemia stable use epo 7 HPTH P HD in am, epo, steroids, bronchodilators, glu control    LOS: 1 day   Aime Carreras L 08/11/2014,7:13 AM

## 2014-08-11 NOTE — Progress Notes (Signed)
Rt took pt off BIPAP and placed on 3LPM Walker. Pt can is awake and can talk in full sentences. Pt in no distress at this time vitals stable.

## 2014-08-11 NOTE — ED Provider Notes (Signed)
Medical screening examination/treatment/procedure(s) were performed by non-physician practitioner and as supervising physician I was immediately available for consultation/collaboration.   EKG Interpretation   Date/Time:  Tuesday August 10 2014 06:40:57 EDT Ventricular Rate:  91 PR Interval:  202 QRS Duration: 78 QT Interval:  354 QTC Calculation: 435 R Axis:   41 Text Interpretation:  Sinus rhythm Borderline prolonged PR interval Low  voltage, precordial leads No significant change was found Confirmed by  Debby Freiberg [phone]) on 08/10/2014 7:03:40 AM       Kalman Drape, MD 08/11/14 [phone]

## 2014-08-11 NOTE — Progress Notes (Signed)
PULMONARY / CRITICAL CARE MEDICINE   Name: [name] MRN: [id number] DOB: August 09, 1961    ADMISSION DATE:  08/10/2014 CONSULTATION DATE:  08/10/2014  REFERRING MD :  Broadus John  CHIEF COMPLAINT:  SOB  INITIAL PRESENTATION: 53 year old female with ESRD on HD presented to ED 9/1 c/o severe dyspnea and productive coughx 2 days. In ED she was in mild respiratory distress. She was admitted under [facility]. Later that day to scheduled dialysis and became acutely SOB again. PCCM asked to see.   STUDIES:   SIGNIFICANT EVENTS: 9/1 admitted, to SDU with acute respiratory failure. Directly to HD unresponsive, acidotic, wishes dnr 9/2- improved  SUBJECTIVE:  Felling better, no distress on O2, dc NIMV from room  VITAL SIGNS: Temp:  [96.5 F (35.8 C)-98.2 F (36.8 C)] 97.3 F (36.3 C) (09/02 0748) Pulse Rate:  [67-99] 76 (09/02 0748) Resp:  [15-21] 20 (09/02 0748) BP: (85-123)/(44-69) 90/57 mmHg (09/02 0748) SpO2:  [92 %-100 %] 93 % (09/02 0748) FiO2 (%):  [60 %] 60 % (09/02 0432) Weight:  [126.7 kg (279 lb 5.2 oz)-130.1 kg (286 lb 13.1 oz)] 127 kg (279 lb 15.8 oz) (09/02 0432) HEMODYNAMICS:   VENTILATOR SETTINGS: Vent Mode:  [-]  FiO2 (%):  [60 %] 60 % INTAKE / OUTPUT:  Intake/Output Summary (Last 24 hours) at 08/11/14 1007 Last data filed at 08/10/14 1658  Gross per 24 hour  Intake      0 ml  Output   3219 ml  Net  -3219 ml    PHYSICAL EXAMINATION: General:  Obese female in NAD Neuro:  Alert, oriented HEENT:  [place]/AT, PERRL, JVD difficult to assess due to nick girth Cardiovascular:  RRR Lungs:  resps even, unlabored. Scant rhonchi, wheeze bilaterally   Improved but present Abdomen:  Rotund, soft, non-distended, non-tender Musculoskeletal:  No acute deformity Skin:  Intact  LABS:  CBC  Recent Labs Lab 08/10/14 0703 08/11/14 0138  WBC 8.8 10.2  HGB 11.5* 11.4*  HCT 37.8 37.5  PLT 173 196   Coag's No results found for this basename: APTT, INR,  in the last 168  hours BMET  Recent Labs Lab 08/10/14 0703 08/10/14 1210 08/11/14 0138  NA 135* 139 135*  K 4.1 4.5 5.1  CL 94* 96 94*  CO2 24 27 23   BUN 37* 33* 24*  CREATININE 7.85* 7.16* 5.05*  GLUCOSE 123* 151* 132*   Electrolytes  Recent Labs Lab 08/10/14 0703 08/10/14 1210 08/11/14 0138  CALCIUM 8.7 8.8 8.0*  PHOS  --  5.8*  --    Sepsis Markers  Recent Labs Lab 08/10/14 1627  PROCALCITON 0.46   ABG  Recent Labs Lab 08/10/14 1252 08/10/14 1600  PHART 7.060* 7.242*  PCO2ART 105.0* 65.7*  PO2ART 93.1 81.5   Liver Enzymes  Recent Labs Lab 08/10/14 1210 08/11/14 0138  AST  --  25  ALT  --  15  ALKPHOS  --  188*  BILITOT  --  0.4  ALBUMIN 4.2 3.5   Cardiac Enzymes  Recent Labs Lab 08/10/14 0703 08/10/14 2107 08/11/14 0138 08/11/14 0706  TROPONINI <0.30 <0.30 <0.30 <0.30  PROBNP 1903.0*  --   --   --    Glucose  Recent Labs Lab 08/10/14 1736 08/10/14 1948 08/11/14 0013 08/11/14 0445 08/11/14 0747  GLUCAP 119* 158* 145* 137* 136*    Imaging Dg Chest Portable 1 View  08/10/2014   CLINICAL DATA:  Shortness of breath and congestion  EXAM: PORTABLE CHEST - 1 VIEW  COMPARISON:  03/25/2014  FINDINGS: There is diffuse interstitial coarsening with bronchial cuffing. No overt pulmonary edema. No effusion, asymmetric opacity, or pneumothorax.  Normal heart size for technique. A SVC stent is again noted. Unchanged appearance of the mildly widened upper mediastinum, accentuated by portable technique.  IMPRESSION: Diffuse interstitial coarsening which could be congestive or bronchitic.   Electronically Signed   By: Jorje Guild M.D.   On: 08/10/2014 07:15   CXR 9/1 - Diffuse interstitial coarsening which could be congestive or bronchitic.  ASSESSMENT / PLAN:  PULMONARY A: Acute on chronic hypercarbic respiratory failure Probably pulmonary edema COPD without evidence of acute exacerbation OSA on nocturnal CPAP ATX right  P:   D/c BiPAP Nocutrnal  CPAP Supplemental O2 as needed to maintain SpO2 greater than 92% F/u CXR for linear changes right Pulmonary hygiene Taper steroids as able to q12h Hemodialysis for volume removal, likley can tolerate daily repeat for volume off BDers  CARDIOVASCULAR A:  SVC syndrome  H/o Hypotension  P:  HD for volume removal Continue preadmission midodrine Echo pending  RENAL A:   ESRD on HD (TTS)-baseline creat 7  P:   Management per renal Trend Bmet  GASTROINTESTINAL A:   GERD  P:   PO PPI Start heart healthy, renal diet  HEMATOLOGIC A:   Anemia  P:  Trend CBC SCDs  INFECTIOUS A:  ?CAP/bronchitis  P:   Continue empiric antibiotics for now Abx: azithromycin off Abx: levaquin, start date 9/1>>> Trend PCT to hopefully limit ABX exposure - low even in setting esrd Not impressed with infection, would follow right linear changes however If pcxr rt atz resolved would dc al abx  ENDOCRINE A:   Diabetes  Chronic prednisone use  P:   ssi protocol   NEUROLOGIC A:   No acute issues P:   RASS goal: 0 Supportive care   Global: Discussion with patient. Unsure what code status should be. Was informed of risks and benefits and what long term outlook could be if she were intubated/resuscitated. She will talk with family and inform us of decision. Full code until that time. To [facility] as primary, will follow  Georgann Housekeeper, ACNP [place] [facility] Pager [phone] or [phone]   I have personally obtained a history, examined the patient, evaluated laboratory and imaging results, formulated the assessment and plan and placed orders.   I have fully examined this patient and agree with above findings.     Lavon Paganini. Titus Mould, MD, [place] Pgr: [facility]

## 2014-08-11 NOTE — Progress Notes (Signed)
  Echocardiogram 2D Echocardiogram has been performed.  Darlina Sicilian M 08/11/2014, 11:56 AM

## 2014-08-12 ENCOUNTER — Inpatient Hospital Stay (HOSPITAL_COMMUNITY): Payer: Medicare Other

## 2014-08-12 DIAGNOSIS — N186 End stage renal disease: Secondary | ICD-10-CM

## 2014-08-12 DIAGNOSIS — Z992 Dependence on renal dialysis: Secondary | ICD-10-CM

## 2014-08-12 DIAGNOSIS — Z794 Long term (current) use of insulin: Secondary | ICD-10-CM

## 2014-08-12 DIAGNOSIS — G4733 Obstructive sleep apnea (adult) (pediatric): Secondary | ICD-10-CM

## 2014-08-12 DIAGNOSIS — E119 Type 2 diabetes mellitus without complications: Secondary | ICD-10-CM

## 2014-08-12 DIAGNOSIS — J810 Acute pulmonary edema: Secondary | ICD-10-CM | POA: Diagnosis present

## 2014-08-12 DIAGNOSIS — R0689 Other abnormalities of breathing: Secondary | ICD-10-CM | POA: Diagnosis present

## 2014-08-12 LAB — BASIC METABOLIC PANEL
ANION GAP: 20 — AB (ref 5–15)
BUN: 53 mg/dL — AB (ref 6–23)
CALCIUM: 8.1 mg/dL — AB (ref 8.4–10.5)
CO2: 24 mEq/L (ref 19–32)
CREATININE: 7.23 mg/dL — AB (ref 0.50–1.10)
Chloride: 89 mEq/L — ABNORMAL LOW (ref 96–112)
GFR, EST AFRICAN AMERICAN: 7 mL/min — AB (ref >90)
GFR, EST NON AFRICAN AMERICAN: 6 mL/min — AB (ref >90)
Glucose, Bld: 141 mg/dL — ABNORMAL HIGH (ref 70–99)
Potassium: 4.4 mEq/L (ref 3.7–5.3)
Sodium: 133 mEq/L — ABNORMAL LOW (ref 137–147)

## 2014-08-12 LAB — GLUCOSE, CAPILLARY
GLUCOSE-CAPILLARY: 103 mg/dL — AB (ref 70–99)
GLUCOSE-CAPILLARY: 89 mg/dL (ref 70–99)
GLUCOSE-CAPILLARY: 151 mg/dL — AB (ref 70–99)
Glucose-Capillary: 125 mg/dL — ABNORMAL HIGH (ref 70–99)
Glucose-Capillary: 160 mg/dL — ABNORMAL HIGH (ref 70–99)
Glucose-Capillary: 85 mg/dL (ref 70–99)

## 2014-08-12 LAB — CBC
HCT: 35.6 % — ABNORMAL LOW (ref 36.0–46.0)
Hemoglobin: 11.2 g/dL — ABNORMAL LOW (ref 12.0–15.0)
MCH: 27.9 pg (ref 26.0–34.0)
MCHC: 31.5 g/dL (ref 30.0–36.0)
MCV: 88.8 fL (ref 78.0–100.0)
Platelets: 219 10*3/uL (ref 150–400)
RBC: 4.01 MIL/uL (ref 3.87–5.11)
RDW: 14.3 % (ref 11.5–15.5)
WBC: 11 10*3/uL — ABNORMAL HIGH (ref 4.0–10.5)

## 2014-08-12 LAB — PROCALCITONIN: PROCALCITONIN: 0.7 ng/mL

## 2014-08-12 MED ORDER — SODIUM CHLORIDE 0.9 % IV SOLN: 100.0000 mL | INTRAVENOUS | Status: DC | PRN

## 2014-08-12 MED ORDER — PENTAFLUOROPROP-TETRAFLUOROETH EX AERO: 1.0000 "application " | INHALATION_SPRAY | CUTANEOUS | Status: DC | PRN

## 2014-08-12 MED ORDER — HEPARIN SODIUM (PORCINE) 1000 UNIT/ML DIALYSIS
100.0000 [IU]/kg | INTRAMUSCULAR | Status: DC | PRN
  Administered 2014-08-12: 12700 [IU] via INTRAVENOUS_CENTRAL
  Filled 2014-08-12: qty 13

## 2014-08-12 MED ORDER — ALTEPLASE 2 MG IJ SOLR
2.0000 mg | Freq: Once | INTRAMUSCULAR | Status: AC | PRN
  Filled 2014-08-12: qty 2

## 2014-08-12 MED ORDER — LIDOCAINE HCL (PF) 1 % IJ SOLN: 5.0000 mL | INTRAMUSCULAR | Status: DC | PRN

## 2014-08-12 MED ORDER — HEPARIN SODIUM (PORCINE) 1000 UNIT/ML DIALYSIS: 1000.0000 [IU] | INTRAMUSCULAR | Status: DC | PRN

## 2014-08-12 MED ORDER — INSULIN ASPART 100 UNIT/ML ~~LOC~~ SOLN
0.0000 [IU] | Freq: Three times a day (TID) | SUBCUTANEOUS | Status: DC
  Administered 2014-08-13 (×2): 2 [IU] via SUBCUTANEOUS
  Administered 2014-08-13: 1 [IU] via SUBCUTANEOUS

## 2014-08-12 MED ORDER — DOXERCALCIFEROL 4 MCG/2ML IV SOLN
INTRAVENOUS | Status: AC
  Administered 2014-08-12: 1 ug via INTRAVENOUS
  Filled 2014-08-12: qty 2

## 2014-08-12 MED ORDER — LIDOCAINE-PRILOCAINE 2.5-2.5 % EX CREA: 1.0000 "application " | TOPICAL_CREAM | CUTANEOUS | Status: DC | PRN

## 2014-08-12 MED ORDER — INSULIN ASPART 100 UNIT/ML ~~LOC~~ SOLN: 0.0000 [IU] | Freq: Every day | SUBCUTANEOUS | Status: DC

## 2014-08-12 MED ORDER — NEPRO/CARBSTEADY PO LIQD: 237.0000 mL | ORAL | Status: DC | PRN

## 2014-08-12 NOTE — Progress Notes (Signed)
Gave report to [facility]. Nurse tech taking pt shortly.

## 2014-08-12 NOTE — Evaluation (Signed)
Physical Therapy Evaluation Patient Details Name: [name] MRN: [id number] DOB: [birth year] Today's Date: 08/12/2014   History of Present Illness  : Patient is a 53 year old morbidly obese female with past medical history of hypertension, diabetes, GERD, PE, SVC syndrome, COPD, end-stage renal disease on dialysis (T, TH, Sat) who presents to the emergency department complaining of shortness of breath, productive cough with yellow mucus and nasal congestion x2 days. Patient states she is short of breath both at rest and on exertion. States when she coughs the front of her chest hurts. She tried using her albuterol inhaler when she woke up this morning with no relief..  Pt has had 2 sessions of HD with significant fluid and weight loss  Clinical Impression  Pt admitted with/for SOB and cough.  Fluid overloaded with pulmonary edema.  Pt currently limited functionally due to the problems listed below.  (see problems list.)  Pt will benefit from PT to maximize function and safety to be able to get home safely with available assist of family.     Follow Up Recommendations No PT follow up;Supervision for mobility/OOB    Equipment Recommendations  None recommended by PT    Recommendations for Other Services       Precautions / Restrictions        Mobility  Bed Mobility Overal bed mobility: Modified Independent                Transfers Overall transfer level: Needs assistance   Transfers: Sit to/from Stand Sit to Stand: Supervision         General transfer comment: uses safe transfer technique  Ambulation/Gait Ambulation/Gait assistance: Min guard Ambulation Distance (Feet): 180 Feet Assistive device: None Gait Pattern/deviations: Step-through pattern Gait velocity: slow Gait velocity interpretation: Below normal speed for age/gender General Gait Details: stiff legged gait with excessive w/shift, but steady enough  Stairs            Wheelchair Mobility     Modified Rankin (Stroke Patients Only)       Balance Overall balance assessment: Needs assistance Sitting-balance support: Feet supported;No upper extremity supported Sitting balance-Leahy Scale: Fair     Standing balance support: No upper extremity supported Standing balance-Leahy Scale: Fair                               Pertinent Vitals/Pain Pain Assessment: No/denies pain    Home Living Family/patient expects to be discharged to:: Private residence Living Arrangements: Children;Other relatives;Spouse/significant other Available Help at Discharge: Family Type of Home: House Home Access: Stairs to enter Entrance Stairs-Rails: Psychiatric nurse of Steps: 3 Home Layout: One level Home Equipment: Other (comment) (TBA)      Prior Function Level of Independence: Independent         Comments: husband drives her to/from HD and she does get out to pay bills.     Hand Dominance        Extremity/Trunk Assessment               Lower Extremity Assessment: Overall WFL for tasks assessed;Generalized weakness         Communication   Communication: No difficulties  Cognition Arousal/Alertness: Awake/alert Behavior During Therapy: WFL for tasks assessed/performed Overall Cognitive Status: Within Functional Limits for tasks assessed                      General Comments  Exercises        Assessment/Plan    PT Assessment Patient needs continued PT services  PT Diagnosis Generalized weakness;Abnormality of gait   PT Problem List Decreased strength;Decreased activity tolerance;Decreased mobility;Cardiopulmonary status limiting activity  PT Treatment Interventions Gait training;Stair training;Functional mobility training;Therapeutic activities;Patient/family education   PT Goals (Current goals can be found in the Care Plan section) Acute Rehab PT Goals Patient Stated Goal: be able to do more, get back home PT  Goal Formulation: With patient Time For Goal Achievement: 08/19/14 Potential to Achieve Goals: Good    Frequency Min 3X/week   Barriers to discharge        Co-evaluation               End of Session Equipment Utilized During Treatment: Oxygen Activity Tolerance: Patient tolerated treatment well Patient left: in chair;with call bell/phone within reach Nurse Communication: Mobility status         Time: [id number] PT Time Calculation (min): 26 min   Charges:   PT Evaluation $Initial PT Evaluation Tier I: 1 Procedure PT Treatments $Gait Training: 8-22 mins   PT G Codes:          Aayushi Solorzano, Tessie Fass 08/12/2014, 5:43 PM 08/12/2014  Donnella Sham, PT [phone]  (pager)

## 2014-08-12 NOTE — Progress Notes (Signed)
Pt just came to floor from dialysis. This will be first time day shift nurse will assess pt as pt was off floor prior to changed of shift.

## 2014-08-12 NOTE — Progress Notes (Signed)
Subjective: Interval History: has no complaint, breathing better , less wheezing.  Objective: Vital signs in last 24 hours: Temp:  [97.5 F (36.4 C)-98.3 F (36.8 C)] 98.3 F (36.8 C) (09/03 0700) Pulse Rate:  [49-76] 49 (09/03 0830) Resp:  [14-26] 19 (09/03 0830) BP: (89-120)/(53-69) 89/53 mmHg (09/03 0830) SpO2:  [89 %-98 %] 95 % (09/03 0700) FiO2 (%):  [30 %] 30 % (09/03 0418) Weight:  [111.9 kg (246 lb 11.1 oz)-112.7 kg (248 lb 7.3 oz)] 111.9 kg (246 lb 11.1 oz) (09/03 0700) Weight change: -16.9 kg (-37 lb 4.1 oz)  Intake/Output from previous day:   Intake/Output this shift:    General appearance: alert, cooperative and morbidly obese Resp: diminished breath sounds bilaterally, rhonchi bibasilar and wheezes bibasilar Cardio: S1, S2 normal and systolic murmur: holosystolic 2/6, blowing at apex GI: obese, pos bs, liver down 6 cm Extremities: AVG groin, B&T  Lab Results:  Recent Labs  08/11/14 0138 08/12/14 0413  WBC 10.2 11.0*  HGB 11.4* 11.2*  HCT 37.5 35.6*  PLT 196 219   BMET:  Recent Labs  08/11/14 0138 08/12/14 0413  NA 135* 133*  K 5.1 4.4  CL 94* 89*  CO2 23 24  GLUCOSE 132* 141*  BUN 24* 53*  CREATININE 5.05* 7.23*  CALCIUM 8.0* 8.1*   No results found for this basename: PTH,  in the last 72 hours [place] Studies: No results found for this basename: IRON, TIBC, TRANSFERRIN, FERRITIN,  in the last 72 hours  Studies/Results: Dg Chest Port 1 View  08/12/2014   CLINICAL DATA:  Evaluate airspace  EXAM: PORTABLE CHEST - 1 VIEW  COMPARISON:  08/11/2014  FINDINGS: Improved aeration of the lungs. Improved lung volume and decrease in patchy bibasilar airspace disease. Negative for edema or effusion.  IMPRESSION: Improved aeration of the lungs bilaterally.   Electronically Signed   By: Franchot Gallo M.D.   On: 08/12/2014 07:22   Dg Chest Port 1 View  08/11/2014   CLINICAL DATA:  Shortness of breath.  EXAM: PORTABLE CHEST - 1 VIEW  COMPARISON:  Single view of  the chest 08/10/2014. And lateral chest 03/25/2014.  FINDINGS: There is cardiomegaly and mild interstitial edema. Discoid atelectasis is seen in the right mid lung zone and left lung base. No pneumothorax or pleural effusion is identified.  IMPRESSION: Cardiomegaly with mild interstitial edema and scattered atelectasis.   Electronically Signed   By: Inge Rise M.D.   On: 08/11/2014 07:10    I have reviewed the patient's current medications.  Assessment/Plan: 1 ESRD for HD 2 COPD flare primary issue.  On steroids, bronchodilators, AB 3 DM controlled follow closely with steroids 4 Obesity 5 anemia 6 OSA 7 HPTH P hd, steroids, bronchodilators    LOS: 2 days   Eisha Chatterjee L 08/12/2014,8:54 AM

## 2014-08-12 NOTE — Progress Notes (Signed)
[facility]  [name] [id number] DOB: [birth year] DOA: 08/10/2014 PCP: Barbette Merino, MD   Brief narrative: 53 year old BF PMHx chronic kidney disease on dialysis. Presented to the hospital because of shortness of breath with productive cough for 2 days. She presented for her usual dialysis treatment but was sent over from the dialysis center because of progressive respiratory symptoms. The plan was to admit patient to Gen. renal floor but before she could undergo dialysis she became acutely short of breath associated with unresponsiveness and respiratory acidosis. She was placed on BiPAP and confirm DO NOT RESUSCITATE status and no intubation. Her chest x-ray in the ER demonstrated diffuse interstitial coarsening which could be congestive for bronchitic. She did not have fever or leukocytosis so infection was deemed less likely.  After undergoing dialysis for present pulmonary edema and treatment with BiPAP patient's mentation improved as well as her respiratory status and she was able to be weaned to nasal cannula oxygen. Her weight has decreased dramatically since admission and repeat hemodialysis treatments: She has gone from 285 pounds to 279 pounds. Since being transferred to the step down unit there is noted a significant discrepancy in weight with her initial weight this morning it 248 pounds with postdialysis weight 238 pounds.   HPI/Subjective: Patient alert and examined during dialysis treatment no complaints reported.  Assessment/Plan: Active Problems:  Acute respiratory failure with hypoxia/Hypercapnia due to:   A) OSA (obstructive sleep apnea)   B) Acute pulmonary edema   C) ? COPD Exac/mild pulmonary hypertension -Primary etiology was secondary to acute pulmonary edema which resolved with dialysis treatment; had not missed any dialysis treatments -concern for possible COPD exacerbation so currently on steroid taper and Levaquin -CPAP  per respiratory  Pulmonary hypertension -Control HTN, diabetes type 2, and be compliant with CPAP for OSA -Obtain lipid panel    ESRD on hemodialysis/Secondary hyperparathyroidism -Per nephrology-apparently has a component of chronic hypotension; continue midodrine 10 mg TID    Anemia of chronic disease -Hemoglobin stable-per nephrology    Insulin-requiring or dependent type II diabetes mellitus -CBGs stable-continue Lantus and sliding scale insulin -4/17 hemoglobin A1c= 6.6   DVT prophylaxis: Subcutaneous heparin Code Status: Full Family Communication: No family at bedside Disposition Plan/Expected LOS: Transfer to floor   Consultants: Nephrology PCCM  Procedures: 2-D echocardiogram - LVEF=  60% to 65%.  - Tricuspid valve:  mild-moderate regurgitation. - Pulmonary arteries: PA Peak pressure: 44 mm Hg (S).   Cultures: MRSA PCR positive  Antibiotics: Levaquin 9/1 >>>  Objective: Blood pressure 100/57, pulse 64, temperature 97.6 F (36.4 C), temperature source Oral, resp. rate 17, height 5\' 1"  (1.549 m), weight 238 lb 5.1 oz (108.1 kg), SpO2 99.00%.  Intake/Output Summary (Last 24 hours) at 08/12/14 1252 Last data filed at 08/12/14 1200  Gross per 24 hour  Intake      0 ml  Output   4000 ml  Net  -4000 ml     Exam: Gen: No acute respiratory distress Chest: Clear to auscultation bilaterally without wheezes, rhonchi or crackles, 2 L Cardiac: Regular rate and rhythm, S1-S2, no rubs murmurs or gallops, no peripheral edema, no JVD Abdomen: Soft nontender nondistended but obese without obvious hepatosplenomegaly, no ascites Extremities: Symmetrical in appearance without cyanosis, clubbing or effusion  Scheduled Meds:  Scheduled Meds: . calcium acetate  2,001 mg Oral TID WC  . [START ON 08/17/2014] darbepoetin (ARANESP) injection - DIALYSIS  60 mcg Intravenous Q Tue-HD  .  doxercalciferol  1 mcg Intravenous Q T,Th,Sa-HD  . ferric gluconate (FERRLECIT/NULECIT) IV   125 mg Intravenous Q T,Th,Sa-HD  . heparin  3,000 Units Dialysis Once in dialysis  . heparin  5,000 Units Subcutaneous 3 times per day  . insulin aspart  0-5 Units Subcutaneous QHS  . insulin aspart  0-9 Units Subcutaneous TID WC  . insulin glargine  15 Units Subcutaneous QHS  . ipratropium-albuterol  3 mL Nebulization QID  . levofloxacin (LEVAQUIN) IV  500 mg Intravenous Q48H  . methylPREDNISolone (SOLU-MEDROL) injection  40 mg Intravenous Q12H  . midodrine  10 mg Oral TID WC  . multivitamin  1 tablet Oral QHS  . pantoprazole  40 mg Oral Q1200   Continuous Infusions:   Data Reviewed: Basic Metabolic Panel:  Recent Labs Lab 08/10/14 0703 08/10/14 1210 08/11/14 0138 08/12/14 0413  NA 135* 139 135* 133*  K 4.1 4.5 5.1 4.4  CL 94* 96 94* 89*  CO2 24 27 23 24   GLUCOSE 123* 151* 132* 141*  BUN 37* 33* 24* 53*  CREATININE 7.85* 7.16* 5.05* 7.23*  CALCIUM 8.7 8.8 8.0* 8.1*  PHOS  --  5.8*  --   --    Liver Function Tests:  Recent Labs Lab 08/10/14 1210 08/11/14 0138  AST  --  25  ALT  --  15  ALKPHOS  --  188*  BILITOT  --  0.4  PROT  --  8.3  ALBUMIN 4.2 3.5   No results found for this basename: LIPASE, AMYLASE,  in the last 168 hours No results found for this basename: AMMONIA,  in the last 168 hours CBC:  Recent Labs Lab 08/10/14 0703 08/11/14 0138 08/12/14 0413  WBC 8.8 10.2 11.0*  HGB 11.5* 11.4* 11.2*  HCT 37.8 37.5 35.6*  MCV 93.6 92.8 88.8  PLT 173 196 219   Cardiac Enzymes:  Recent Labs Lab 08/10/14 0703 08/10/14 2107 08/11/14 0138 08/11/14 0706  TROPONINI <0.30 <0.30 <0.30 <0.30   BNP (last 3 results)  Recent Labs  08/10/14 0703  PROBNP 1903.0*   CBG:  Recent Labs Lab 08/11/14 1622 08/11/14 2005 08/12/14 0015 08/12/14 0519 08/12/14 1240  GLUCAP 152* 139* 125* 160* 85    Recent Results (from the past 240 hour(s))  MRSA PCR SCREENING     Status: Abnormal   Collection Time    08/10/14  7:27 PM      Result Value Ref Range  Status   MRSA by PCR POSITIVE (*) NEGATIVE Final   Comment:            The GeneXpert MRSA Assay (FDA     approved for NASAL specimens     only), is one component of a     comprehensive MRSA colonization     surveillance program. It is not     intended to diagnose MRSA     infection nor to guide or     monitor treatment for     MRSA infections.     RESULT CALLED TO, READ BACK BY AND VERIFIED WITH:     Dulcy Fanny RN [phone] 08/10/14 A BROWNING     Studies:  Recent x-ray studies have been reviewed in detail by the Attending Physician  Time spent :      Erin Hearing, [facility] Office  [phone] Pager [phone]   **If unable to reach the above provider after paging please contact the [facility] @ [phone]  On-Call/Text Page:      [email or web address]  password [email or web address]  If 7PM-7AM, please contact night-coverage [email or web address] Password [email or web address] 08/12/2014, 12:52 PM   LOS: 2 days   Examined patient and discussed assessment and plan with ANP Ebony Hail and agree with the above plan. Patient with multiple complex medical diagnosis> 40 minutes spent in direct patient care

## 2014-08-12 NOTE — Procedures (Signed)
I was present at this session.  I have reviewed the session itself and made appropriate changes.  HD via leg AVG, bp lower early, not likely to get vol off.    Antavia Tandy L 9/3/20158:53 AM

## 2014-08-12 NOTE — Progress Notes (Addendum)
Attempted to call report to [place] room 3 but nurse stated she would call back. Pt is transferring to floor [place].

## 2014-08-13 LAB — LIPID PANEL
CHOLESTEROL: 171 mg/dL (ref 0–200)
HDL: 51 mg/dL (ref >39)
LDL Cholesterol: 96 mg/dL (ref 0–99)
Total CHOL/HDL Ratio: 3.4 RATIO
Triglycerides: 120 mg/dL (ref <150)
VLDL: 24 mg/dL (ref 0–40)

## 2014-08-13 LAB — GLUCOSE, CAPILLARY
GLUCOSE-CAPILLARY: 141 mg/dL — AB (ref 70–99)
GLUCOSE-CAPILLARY: 186 mg/dL — AB (ref 70–99)
Glucose-Capillary: 160 mg/dL — ABNORMAL HIGH (ref 70–99)
Glucose-Capillary: 156 mg/dL — ABNORMAL HIGH (ref 70–99)

## 2014-08-13 MED ORDER — MUPIROCIN 2 % EX OINT
1.0000 "application " | TOPICAL_OINTMENT | Freq: Two times a day (BID) | CUTANEOUS | Status: DC
  Administered 2014-08-13 – 2014-08-14 (×3): 1 via NASAL
  Filled 2014-08-13: qty 22

## 2014-08-13 MED ORDER — METHYLPREDNISOLONE 4 MG PO KIT
4.0000 mg | PACK | Freq: Four times a day (QID) | ORAL | Status: DC
Start: 2014-08-15 — End: 2014-08-14

## 2014-08-13 MED ORDER — METHYLPREDNISOLONE 4 MG PO KIT
4.0000 mg | PACK | ORAL | Status: AC
  Administered 2014-08-13: 4 mg via ORAL

## 2014-08-13 MED ORDER — CHLORHEXIDINE GLUCONATE CLOTH 2 % EX PADS
6.0000 | MEDICATED_PAD | Freq: Every day | CUTANEOUS | Status: DC
  Administered 2014-08-14: 6 via TOPICAL

## 2014-08-13 MED ORDER — METHYLPREDNISOLONE 4 MG PO KIT
4.0000 mg | PACK | Freq: Three times a day (TID) | ORAL | Status: DC
Start: 2014-08-14 — End: 2014-08-14
  Administered 2014-08-14 (×2): 4 mg via ORAL

## 2014-08-13 MED ORDER — METHYLPREDNISOLONE 4 MG PO KIT
8.0000 mg | PACK | Freq: Every morning | ORAL | Status: AC
  Administered 2014-08-13: 8 mg via ORAL
  Filled 2014-08-13: qty 21

## 2014-08-13 MED ORDER — IPRATROPIUM-ALBUTEROL 0.5-2.5 (3) MG/3ML IN SOLN
3.0000 mL | Freq: Two times a day (BID) | RESPIRATORY_TRACT | Status: DC
  Administered 2014-08-13: 3 mL via RESPIRATORY_TRACT
  Filled 2014-08-13 (×2): qty 3

## 2014-08-13 MED ORDER — METHYLPREDNISOLONE 4 MG PO KIT
8.0000 mg | PACK | Freq: Every evening | ORAL | Status: AC
  Administered 2014-08-13: 8 mg via ORAL

## 2014-08-13 MED ORDER — LEVOFLOXACIN 500 MG PO TABS
500.0000 mg | ORAL_TABLET | ORAL | Status: DC
  Administered 2014-08-14: 500 mg via ORAL
  Filled 2014-08-13: qty 1

## 2014-08-13 MED ORDER — METHYLPREDNISOLONE 4 MG PO KIT: 8.0000 mg | PACK | Freq: Every evening | ORAL | Status: DC

## 2014-08-13 NOTE — Progress Notes (Signed)
Subjective:  No cos , no sob tolerated hd yest./ "feeeling better overall   Objective Vital signs in last 24 hours: Filed Vitals:   08/12/14 2151 08/12/14 2347 08/13/14 0545 08/13/14 0804  BP: 101/68  115/68   Pulse: 62 66 57   Temp: 97.6 F (36.4 C)  97.7 F (36.5 C)   TempSrc: Oral  Oral   Resp: 20 16 17    Height:      Weight:      SpO2: 93% 95% 95% 94%   Weight change: -4.6 kg (-10 lb 2.3 oz)  Physical Exam: General: alert obese bf ,NAD Heart: RRR, 2/6 sem  , no rub or gallop Lungs: R upper lobe wheeze, otherwise CTA moving more air,no accessory ms use Abdomen: obese , soft NT, ND liver down 6 cm Extremities:Trace bipedal edema   Dialysis Access: pos bruit L fem avgg   Dialysis Orders: TTS @ [place] [facility]  4:45 127.5 kg 2K/2.5Ca 400/800 L thigh AVG Heparin 3000 U, 1000 U mid-HD  Hectorol 1 mcg No Aranesp or Venofer  Problem/Plan: 1. COPD Flare - on IV Steroids, bronchodilators, Antibio primary cause of admit 2. ESRD - TTS HD Chambers cont to control vol as a factor 3. Anemia - hgb 11.2 low dose esa  On tues hd/reck hgb pre hd in am  4. Secondary hyperparathyroidism - Phoslo binder , vitd iv on hd 5. HTN/volume - 115/68 bp  6. OSA- using CPAP in  hosp/ " and at home" 7. DM - admit following  On  IV Steroids monitor bs closely 8. Massive obesity   Ernest Haber, PA-C [facility] Beeper [phone] 08/13/2014,8:39 AM  LOS: 3 days  I have seen and examined this patient and agree with the plan of care seen, examined, counseled, discussed.  [place] for D/C from renal standpoint .  Vishaal Strollo L 08/13/2014, 10:22 AM   Labs: Basic Metabolic Panel:  Recent Labs Lab 08/10/14 1210 08/11/14 0138 08/12/14 0413  NA 139 135* 133*  K 4.5 5.1 4.4  CL 96 94* 89*  CO2 27 23 24   GLUCOSE 151* 132* 141*  BUN 33* 24* 53*  CREATININE 7.16* 5.05* 7.23*  CALCIUM 8.8 8.0* 8.1*  PHOS 5.8*  --   --    Liver Function Tests:  Recent Labs Lab 08/10/14 1210 08/11/14 0138  AST   --  25  ALT  --  15  ALKPHOS  --  188*  BILITOT  --  0.4  PROT  --  8.3  ALBUMIN 4.2 3.5  CBC:  Recent Labs Lab 08/10/14 0703 08/11/14 0138 08/12/14 0413  WBC 8.8 10.2 11.0*  HGB 11.5* 11.4* 11.2*  HCT 37.8 37.5 35.6*  MCV 93.6 92.8 88.8  PLT 173 196 219   Cardiac Enzymes:  Recent Labs Lab 08/10/14 0703 08/10/14 2107 08/11/14 0138 08/11/14 0706  TROPONINI <0.30 <0.30 <0.30 <0.30   CBG:  Recent Labs Lab 08/12/14 1240 08/12/14 1551 08/12/14 1759 08/12/14 2147 08/13/14 0724  GLUCAP 85 103* 89 151* 160*    Studies/Results: Dg Chest Port 1 View  08/12/2014   CLINICAL DATA:  Evaluate airspace  EXAM: PORTABLE CHEST - 1 VIEW  COMPARISON:  08/11/2014  FINDINGS: Improved aeration of the lungs. Improved lung volume and decrease in patchy bibasilar airspace disease. Negative for edema or effusion.  IMPRESSION: Improved aeration of the lungs bilaterally.   Electronically Signed   By: Franchot Gallo M.D.   On: 08/12/2014 07:22   Medications:   . calcium acetate  2,001 mg Oral TID WC  . [START ON 08/17/2014] darbepoetin (ARANESP) injection - DIALYSIS  60 mcg Intravenous Q Tue-HD  . doxercalciferol  1 mcg Intravenous Q T,Th,Sa-HD  . ferric gluconate (FERRLECIT/NULECIT) IV  125 mg Intravenous Q T,Th,Sa-HD  . heparin  3,000 Units Dialysis Once in dialysis  . heparin  5,000 Units Subcutaneous 3 times per day  . insulin aspart  0-5 Units Subcutaneous QHS  . insulin aspart  0-9 Units Subcutaneous TID WC  . insulin glargine  15 Units Subcutaneous QHS  . ipratropium-albuterol  3 mL Nebulization BID  . levofloxacin (LEVAQUIN) IV  500 mg Intravenous Q48H  . methylPREDNISolone (SOLU-MEDROL) injection  40 mg Intravenous Q12H  . midodrine  10 mg Oral TID WC  . multivitamin  1 tablet Oral QHS  . pantoprazole  40 mg Oral Q1200

## 2014-08-13 NOTE — Progress Notes (Signed)
Physical Therapy Treatment Patient Details Name: JHANIA [name] MRN: [id number] DOB: [birth year] Today's Date: 08/13/2014    History of Present Illness ZLATA [name] is a 53 y.o. female admitted 08/10/14 with c/o SOB, productive cough with yellow mucus and nasal congestion x2 days. PMH of morbid obesity, HTN, DM, GERD, PE, SVC syndrome, COPD, ESRD on dialysis (T/Th/Sat). Pt reports SOB with activity and rest.     PT Comments    Pt did well with gait without AD but did use rail occasionally. Pt educated on HEP and how to slowly increase her current walking program.  Follow Up Recommendations  No PT follow up;Supervision for mobility/OOB     Equipment Recommendations  None recommended by PT    Recommendations for Other Services       Precautions / Restrictions Restrictions Weight Bearing Restrictions: No    Mobility  Bed Mobility                  Transfers     Transfers: Sit to/from Stand Sit to Stand: Modified independent (Device/Increase time)         General transfer comment: uses safe transfer technique  Ambulation/Gait Ambulation/Gait assistance: Min guard;Supervision Ambulation Distance (Feet): 190 Feet Assistive device: None Gait Pattern/deviations: Step-through pattern (Side to side trunk sway) Gait velocity: decreased   General Gait Details: decreased knee flex and increased lateral sway with weight shift.  Using rail at times.   Stairs            Wheelchair Mobility    Modified Rankin (Stroke Patients Only)       Balance             Standing balance-Leahy Scale: Fair                      Cognition Arousal/Alertness: Awake/alert Behavior During Therapy: WFL for tasks assessed/performed Overall Cognitive Status: Within Functional Limits for tasks assessed                      Exercises General Exercises - Lower Extremity Ankle Circles/Pumps: AROM;Both;10 reps;Seated Long Arc Quad:  AROM;Strengthening;Both;10 reps;Seated Hip ABduction/ADduction: AROM;Strengthening;Both;10 reps;Seated Hip Flexion/Marching: AROM;Strengthening;Both;10 reps;Seated    General Comments        Pertinent Vitals/Pain Pain Assessment: No/denies pain    Home Living                      Prior Function            PT Goals (current goals can now be found in the care plan section) Acute Rehab PT Goals Patient Stated Goal: be able to do more, get back home PT Goal Formulation: With patient Time For Goal Achievement: 08/19/14 Potential to Achieve Goals: Good Progress towards PT goals: Progressing toward goals    Frequency  Min 3X/week    PT Plan Current plan remains appropriate    Co-evaluation             End of Session Equipment Utilized During Treatment: Oxygen Activity Tolerance: Patient tolerated treatment well Patient left: in chair;with call bell/phone within reach     Time: 1340-1403 PT Time Calculation (min): 23 min  Charges:  $Gait Training: 8-22 mins $Therapeutic Exercise: 8-22 mins                    G Codes:      [name] 08/13/2014, 2:13 PM

## 2014-08-13 NOTE — Care Management Note (Signed)
CARE MANAGEMENT NOTE 08/13/2014  Patient:  [name], [name]   Account Number:  [id number]  Date Initiated:  08/13/2014  Documentation initiated by:  Josiyah Tozzi  Subjective/Objective Assessment:   CM following for progression and d//c planning.     Action/Plan:   IM given to pt  Pt has Personal care Services   and [facility]  setup with [facility].   Anticipated DC Date:     Anticipated DC Plan:  [place]         Choice offered to / List presented to:          [facility] arranged  HH-1 RN      [facility] agency  [place]   Status of service:   Medicare Important Message given?  YES (If response is "NO", the following Medicare IM given date fields will be blank) Date Medicare IM given:  08/13/2014 Medicare IM given by:  Kennadee Walthour Date Additional Medicare IM given:   Additional Medicare IM given by:    Discharge Disposition:  [place]  Per UR Regulation:    If discussed at Long Length of Stay Meetings, dates discussed:    Comments:

## 2014-08-13 NOTE — Progress Notes (Signed)
Progress Note  [name] [id number] DOB: 08/25/1961 DOA: 08/10/2014 PCP: Barbette Merino, MD   Brief narrative: 53 year old female patient with chronic kidney disease on dialysis. Presented to the hospital because of shortness of breath with productive cough for 2 days. She presented for her usual dialysis treatment but was sent over from the dialysis center because of progressive respiratory symptoms. The plan was to admit patient to Gen. renal floor but before she could undergo dialysis she became acutely short of breath associated with unresponsiveness and respiratory acidosis. She was placed on BiPAP and confirm DO NOT RESUSCITATE status and no intubation. Her chest x-ray in the ER demonstrated diffuse interstitial coarsening which could be congestive for bronchitic. She did not have fever or leukocytosis so infection was deemed less likely.  After undergoing dialysis for present pulmonary edema and treatment with BiPAP patient's mentation improved as well as her respiratory status and she was able to be weaned to nasal cannula oxygen. Her weight has decreased dramatically since admission and repeat hemodialysis treatments: She has gone from 285 pounds to 279 pounds. Since being transferred to the step down unit there is noted a significant discrepancy in weight with her initial weight this morning it 248 pounds with postdialysis weight 238 pounds.   HPI/Subjective: Patient alert and examined , has no complaints. Assessment/Plan: Active Problems:  Acute respiratory failure with hypoxia/Hypercapnia due to:   A) OSA (obstructive sleep apnea)   B) Acute pulmonary edema   C) ? COPD Exac/mild pulmonary hypertension -Primary etiology was secondary to acute pulmonary edema which resolved with dialysis treatment- had not missed any dialysis treatments-concern for possible COPD exacerbation so currently on steroid taper and Levaquin, even though no Hx of smoking.    ESRD on  hemodialysis/Secondary hyperparathyroidism -Per nephrology-apparently has a component of chronic hypotension second 2 midodrine    Anemia of chronic disease -Hemoglobin stable-per nephrology    Insulin-requiring or dependent type II diabetes mellitus -CBGs stable-continue Lantus and sliding scale insulin   DVT prophylaxis: Subcutaneous heparin Code Status: Full Family Communication: No family at bedside Disposition Plan/Expected LOS: Transfer to floor   Consultants: Nephrology PCCM  Procedures: 2-D echocardiogram - Left ventricle: The cavity size was normal. Wall thickness was normal. Systolic function was normal. The estimated ejection fraction was in the range of 60% to 65%. Wall motion was normal; there were no regional wall motion abnormalities. - Tricuspid valve: There was mild-moderate regurgitation. - Pulmonary arteries: Systolic pressure was mildly increased. PA peak pressure: 44 mm Hg (S).   Cultures: MRSA PCR positive  Antibiotics: Levaquin 9/1 >>>  Objective: Blood pressure 115/68, pulse 57, temperature 97.7 F (36.5 C), temperature source Oral, resp. rate 17, height 5\' 2"  (1.575 m), weight 107.6 kg (237 lb 3.4 oz), SpO2 94.00%.  Intake/Output Summary (Last 24 hours) at 08/13/14 0856 Last data filed at 08/12/14 2145  Gross per 24 hour  Intake    340 ml  Output   4000 ml  Net  -3660 ml     Exam: Gen: No acute respiratory distress Chest: Clear to auscultation bilaterally without wheezes, rhonchi or crackles, 2 L Cardiac: Regular rate and rhythm, S1-S2, no rubs murmurs or gallops, no peripheral edema, no JVD Abdomen: Soft nontender nondistended but obese without obvious hepatosplenomegaly, no ascites Extremities: Symmetrical in appearance without cyanosis, clubbing or effusion  Scheduled Meds:  Scheduled Meds: . calcium acetate  2,001 mg Oral TID WC  . [START ON 08/17/2014] darbepoetin (ARANESP) injection - DIALYSIS  60  mcg Intravenous Q Tue-HD  .  doxercalciferol  1 mcg Intravenous Q T,Th,Sa-HD  . ferric gluconate (FERRLECIT/NULECIT) IV  125 mg Intravenous Q T,Th,Sa-HD  . heparin  3,000 Units Dialysis Once in dialysis  . heparin  5,000 Units Subcutaneous 3 times per day  . insulin aspart  0-5 Units Subcutaneous QHS  . insulin aspart  0-9 Units Subcutaneous TID WC  . insulin glargine  15 Units Subcutaneous QHS  . ipratropium-albuterol  3 mL Nebulization BID  . levofloxacin (LEVAQUIN) IV  500 mg Intravenous Q48H  . methylPREDNISolone (SOLU-MEDROL) injection  40 mg Intravenous Q12H  . midodrine  10 mg Oral TID WC  . multivitamin  1 tablet Oral QHS  . pantoprazole  40 mg Oral Q1200   Continuous Infusions:   Data Reviewed: Basic Metabolic Panel:  Recent Labs Lab 08/10/14 0703 08/10/14 1210 08/11/14 0138 08/12/14 0413  NA 135* 139 135* 133*  K 4.1 4.5 5.1 4.4  CL 94* 96 94* 89*  CO2 24 27 23 24   GLUCOSE 123* 151* 132* 141*  BUN 37* 33* 24* 53*  CREATININE 7.85* 7.16* 5.05* 7.23*  CALCIUM 8.7 8.8 8.0* 8.1*  PHOS  --  5.8*  --   --    Liver Function Tests:  Recent Labs Lab 08/10/14 1210 08/11/14 0138  AST  --  25  ALT  --  15  ALKPHOS  --  188*  BILITOT  --  0.4  PROT  --  8.3  ALBUMIN 4.2 3.5   No results found for this basename: LIPASE, AMYLASE,  in the last 168 hours No results found for this basename: AMMONIA,  in the last 168 hours CBC:  Recent Labs Lab 08/10/14 0703 08/11/14 0138 08/12/14 0413  WBC 8.8 10.2 11.0*  HGB 11.5* 11.4* 11.2*  HCT 37.8 37.5 35.6*  MCV 93.6 92.8 88.8  PLT 173 196 219   Cardiac Enzymes:  Recent Labs Lab 08/10/14 0703 08/10/14 2107 08/11/14 0138 08/11/14 0706  TROPONINI <0.30 <0.30 <0.30 <0.30   BNP (last 3 results)  Recent Labs  08/10/14 0703  PROBNP 1903.0*   CBG:  Recent Labs Lab 08/12/14 1240 08/12/14 1551 08/12/14 1759 08/12/14 2147 08/13/14 0724  GLUCAP 85 103* 89 151* 160*    Recent Results (from the past 240 hour(s))  MRSA PCR SCREENING      Status: Abnormal   Collection Time    08/10/14  7:27 PM      Result Value Ref Range Status   MRSA by PCR POSITIVE (*) NEGATIVE Final   Comment:            The GeneXpert MRSA Assay (FDA     approved for NASAL specimens     only), is one component of a     comprehensive MRSA colonization     surveillance program. It is not     intended to diagnose MRSA     infection nor to guide or     monitor treatment for     MRSA infections.     RESULT CALLED TO, READ BACK BY AND VERIFIED WITHDulcy Fanny RN [phone] 08/10/14 A BROWNING      Time spent : 25 minutes.      Phillips Climes MD   Pager [phone]  On-Call/Text Page:      [email or web address]      password [email or web address]  If 7PM-7AM, please contact night-coverage [email or web address] Password [email or web address] 08/13/2014, 8:56 AM   LOS: 3 days

## 2014-08-13 NOTE — Progress Notes (Signed)
Occupational Therapy Evaluation Patient Details Name: [name] MRN: [id number] DOB: 1961-11-24 Today's Date: 08/13/2014    History of Present Illness [name] is a 53 y.o. female admitted 08/10/14 with c/o SOB, productive cough with yellow mucus and nasal congestion x2 days. PMH of morbid obesity, HTN, DM, GERD, PE, SVC syndrome, COPD, ESRD on dialysis (T/Th/Sat). Pt reports SOB with activity and rest.    Clinical Impression   PTA pt lived at home with her family and reports using 4-wheeled walker PRN for stability. Pt reports that she has an aide who comes M-F for ~3 hours a day who mainly assists with IADLs, but also provides Supervision when pt is in bathroom for additional safety. Pt currently requires Supervision for all IADLs and reports she will have 24/7 Supervision. No further acute OT needs.     Follow Up Recommendations  No OT follow up;Supervision/Assistance - 24 hour    Equipment Recommendations  Tub/shower bench    Recommendations for Other Services       Precautions / Restrictions Restrictions Weight Bearing Restrictions: No      Mobility Bed Mobility Overal bed mobility: Modified Independent                Transfers Overall transfer level: Needs assistance   Transfers: Sit to/from Stand Sit to Stand: Supervision         General transfer comment: uses safe transfer technique         ADL Overall ADL's : Needs assistance/impaired Eating/Feeding: Independent;Sitting   Grooming: Supervision/safety;Standing   Upper Body Bathing: Modified independent;Sitting   Lower Body Bathing: Supervison/ safety;Set up;Sit to/from stand   Upper Body Dressing : Modified independent;Sitting   Lower Body Dressing: Supervision/safety;Sit to/from stand;Set up   Toilet Transfer: Supervision/safety;Ambulation   Toileting- Clothing Manipulation and Hygiene: Supervision/safety;Sit to/from stand       Functional mobility during ADLs:  Supervision/safety General ADL Comments: Educated pt on safety with ADLs at home including sitting for LB ADLs and shower. Educated on fall prevention and deep breathing techniques to increase O2 levels throughout the day. Advised pt to actively breathe during activity. Pt performed sit<>stand x5 while practicing deep breathing with O2 levels dipping slightly by the end to 90 from 95.      Vision  No apparent visual deficits                   Perception Perception Perception Tested?: No   Praxis Praxis Praxis tested?: Within functional limits    Pertinent Vitals/Pain Pain Assessment: No/denies pain     Hand Dominance Right   Extremity/Trunk Assessment Upper Extremity Assessment Upper Extremity Assessment: Overall WFL for tasks assessed   Lower Extremity Assessment Lower Extremity Assessment: Overall WFL for tasks assessed   Cervical / Trunk Assessment Cervical / Trunk Assessment: Normal   Communication Communication Communication: No difficulties   Cognition Arousal/Alertness: Awake/alert Behavior During Therapy: WFL for tasks assessed/performed Overall Cognitive Status: Within Functional Limits for tasks assessed                                Home Living Family/patient expects to be discharged to:: Private residence Living Arrangements: Children;Other relatives;Spouse/significant other Available Help at Discharge: Family Type of Home: House Home Access: Stairs to enter [employer] of Steps: 3 Entrance Stairs-Rails: Right;Left Home Layout: One level     Bathroom Shower/Tub: Tub/shower unit Shower/tub characteristics: Architectural technologist: Standard  Home Equipment: [place] - 4 wheels   Additional Comments: pt has aide who comes M-F for 3 hours a day who assists with IADLs (cleaning) and supervising when pt is in bathroom.      Prior Functioning/Environment Level of Independence: Independent with assistive device(s)         Comments: pt uses RW PRN. Pt does not drive                              End of Session Equipment Utilized During Treatment: Oxygen  Activity Tolerance: Patient tolerated treatment well Patient left: in bed;with call bell/phone within reach   Time: 0847-0911 OT Time Calculation (min): 24 min Charges:  OT General Charges $OT Visit: 1 Procedure OT Evaluation $Initial OT Evaluation Tier I: 1 Procedure OT Treatments $Self Care/Home Management : 8-22 mins  Juluis Rainier [id number] 08/13/2014, 10:37 AM

## 2014-08-13 NOTE — Progress Notes (Signed)
This patient is receiving Levofloxaxin 500 mg IV every 48 hours. Based on criteria approved by the Pharmacy and Therapeutics Committee, this medication is being converted to the equivalent oral dose form. These criteria include:   . The patient is eating (either orally or per tube) and/or has been taking other orally administered medications for at least 24 hours.  . This patient has no evidence of active gastrointestinal bleeding or impaired GI absorption (gastrectomy, short bowel, patient on TNA or NPO).   If you have questions about this conversion, please contact the pharmacy department.  Thank you, Theron Arista, PharmD Clinical Pharmacist - Resident Pager: [phone] 9/4/201512:07 PM

## 2014-08-14 LAB — RENAL FUNCTION PANEL
Albumin: 3.7 g/dL (ref 3.5–5.2)
Anion gap: 17 — ABNORMAL HIGH (ref 5–15)
BUN: 47 mg/dL — AB (ref 6–23)
CALCIUM: 8.6 mg/dL (ref 8.4–10.5)
CO2: 24 mEq/L (ref 19–32)
Chloride: 96 mEq/L (ref 96–112)
Creatinine, Ser: 5.47 mg/dL — ABNORMAL HIGH (ref 0.50–1.10)
GFR calc Af Amer: 9 mL/min — ABNORMAL LOW (ref >90)
GFR calc non Af Amer: 8 mL/min — ABNORMAL LOW (ref >90)
GLUCOSE: 126 mg/dL — AB (ref 70–99)
PHOSPHORUS: 2 mg/dL — AB (ref 2.3–4.6)
Potassium: 3.5 mEq/L — ABNORMAL LOW (ref 3.7–5.3)
SODIUM: 137 meq/L (ref 137–147)

## 2014-08-14 LAB — CBC
HEMATOCRIT: 37.1 % (ref 36.0–46.0)
Hemoglobin: 12 g/dL (ref 12.0–15.0)
MCH: 28.8 pg (ref 26.0–34.0)
MCHC: 32.3 g/dL (ref 30.0–36.0)
MCV: 89 fL (ref 78.0–100.0)
Platelets: 195 10*3/uL (ref 150–400)
RBC: 4.17 MIL/uL (ref 3.87–5.11)
RDW: 14.3 % (ref 11.5–15.5)
WBC: 7.8 10*3/uL (ref 4.0–10.5)

## 2014-08-14 LAB — GLUCOSE, CAPILLARY: Glucose-Capillary: 144 mg/dL — ABNORMAL HIGH (ref 70–99)

## 2014-08-14 MED ORDER — DOXERCALCIFEROL 4 MCG/2ML IV SOLN
INTRAVENOUS | Status: AC
  Administered 2014-08-14: 1 ug via INTRAVENOUS
  Filled 2014-08-14: qty 2

## 2014-08-14 MED ORDER — LEVOFLOXACIN 500 MG PO TABS
500.0000 mg | ORAL_TABLET | ORAL | Status: AC
Start: 2014-08-14 — End: 2014-08-19

## 2014-08-14 MED ORDER — METHYLPREDNISOLONE (PAK) 4 MG PO TABS: ORAL_TABLET | ORAL | Status: DC

## 2014-08-14 NOTE — Progress Notes (Signed)
Pt signed d/c papers. Discussed s/sx to return, prescriptions/medication regimen, and triggers for COPD exacerbation. IV removed, All questions answered. Will continue to monitor.

## 2014-08-14 NOTE — Procedures (Signed)
I was present at this session.  I have reviewed the session itself and made appropriate changes.  HD via L groin AVG . Access press ok. Vol ok.     [name] L 9/5/20159:04 AM

## 2014-08-14 NOTE — Progress Notes (Signed)
Subjective: Interval History: has no complaint, feels much better.  Objective: Vital signs in last 24 hours: Temp:  [97.4 F (36.3 C)-97.7 F (36.5 C)] 97.4 F (36.3 C) (09/05 0820) Pulse Rate:  [45-56] 48 (09/05 0825) Resp:  [13-20] 15 (09/05 0825) BP: (95-126)/(36-77) 95/53 mmHg (09/05 0825) SpO2:  [94 %-97 %] 97 % (09/05 0820) Weight:  [122.2 kg (269 lb 6.4 oz)] 122.2 kg (269 lb 6.4 oz) (09/05 0820) Weight change:   Intake/Output from previous day: 09/04 0701 - 09/05 0700 In: 1070 [P.O.:960; IV Piggyback:110] Out: -  Intake/Output this shift:    General appearance: alert, cooperative and morbidly obese Resp: diminished breath sounds bilaterally and no wheezes or rhonchi Cardio: S1, S2 normal and systolic murmur: holosystolic 2/6, blowing at apex GI: pos bs,soft, liver down 6 cm Extremities: AVG L groin  Lab Results:  Recent Labs  08/12/14 0413 08/14/14 0838  WBC 11.0* 7.8  HGB 11.2* 12.0  HCT 35.6* 37.1  PLT 219 195   BMET:  Recent Labs  08/12/14 0413  NA 133*  K 4.4  CL 89*  CO2 24  GLUCOSE 141*  BUN 53*  CREATININE 7.23*  CALCIUM 8.1*   No results found for this basename: PTH,  in the last 72 hours Iron Studies: No results found for this basename: IRON, TIBC, TRANSFERRIN, FERRITIN,  in the last 72 hours  Studies/Results: No results found.  I have reviewed the patient's current medications.  Assessment/Plan: 1 ESRD for HD. Vol ok 2 Anemia stable 3 Obesity discussed 4 HPTH 5 COPD flare primary issue.,Need slower Pred taper 6 OSA P bronchodilators, steroids with slower taper, HD, diet    LOS: 4 days   Jareli Highland L 08/14/2014,9:05 AM

## 2014-08-14 NOTE — Discharge Summary (Signed)
[name], 53 y.o., DOB 10-Jun-1961, MRN [id number]. Admission date: 08/10/2014 Discharge Date 08/14/2014 Primary MD Barbette Merino, MD Admitting Physician Domenic Polite, MD  Admission Diagnosis  COPD exacerbation [491.21]  Discharge Diagnosis   Active Problems:   ESRD on hemodialysis   OSA (obstructive sleep apnea)   Anemia of chronic disease   Secondary hyperparathyroidism   Insulin-requiring or dependent type II diabetes mellitus   Acute respiratory failure with hypoxia   Hypercapnia   Acute pulmonary edema      Past Medical History  Diagnosis Date  . HTN (hypertension)   . Obesities, morbid   . GERD (gastroesophageal reflux disease)   . PE (pulmonary embolism) ~ 2010  . Superior vena cava syndrome     Archie Endo 03/26/2013  . Pneumonia     "now & once a long time ago" (03/26/2013)  . Shortness of breath     "just a little bit; at any time" (03/26/2013)  . OSA on CPAP   . Iron deficiency anemia   . Chronic hypotension   . COPD (chronic obstructive pulmonary disease)   . ESRD (end stage renal disease) on dialysis     "TTS; [place]; (03/25/2014)  . Diabetes mellitus type 2 in obese     insulin dependent    Past Surgical History  Procedure Laterality Date  . Vascular surgery    . Thrombectomy and revision of arterioventous (av) goretex  graft Left ~ 12/2012    "thigh"   . Arteriovenous graft placement Left ~ 2011    "thigh" (4  . Carpal tunnel release Right ~ 2011   Brief narrative:  53 year old female patient with chronic kidney disease on dialysis. Presented to the hospital because of shortness of breath with productive cough for 2 days. She presented for her usual dialysis treatment but was sent over from the dialysis center because of progressive respiratory symptoms. Patient was acutely short of breath associated with unresponsiveness and respiratory acidosis. She was placed on BiPAP and confirm DO NOT RESUSCITATE status and no intubation. Her chest x-ray in the ER  demonstrated diffuse interstitial coarsening which could be congestive for bronchitic. She did not have fever or leukocytosis so infection was deemed less likely.  After undergoing dialysis for present pulmonary edema and treatment with BiPAP patient's mentation improved as well as her respiratory status and she was able to be weaned to nasal cannula oxygen. Her weight has decreased dramatically since admission and repeat hemodialysis treatments: She has gone from 285 pounds to 279 pounds. Since being transferred to the step down unit there is noted a significant discrepancy in weight with her initial weight this morning it 248 pounds with postdialysis weight 238 pounds.    Hospital Course See H&P, Labs, Consult and Test reports for all details in brief,    Active Problems:  Acute respiratory failure with hypoxia/Hypercapnia due to:  A) OSA (obstructive sleep apnea)  B) Acute pulmonary edema  C) questionable COPD Exac/mild pulmonary hypertension  -Primary etiology was secondary to acute pulmonary edema which resolved with dialysis treatment- patient was taken off BiPAP, currently tolerating room air, there was a questionable COPD exacerbation, but patient was never a smoker, she was started on IV steroids and IV antibiotics, day currently she is being discharged on by mouth Levaquin for a total of 5 days and a steroid tapering dose with Medrol pack .  ESRD on hemodialysis/Secondary hyperparathyroidism  Patient continued to her hemodialysis  as per schedule Tuesday Thursday Saturday schedule, and was within  resume his if her hemodialysis as an outpatient as scheduled.  Anemia of chronic disease  Patient hemoglobin has been stable.  Insulin-requiring or dependent type II diabetes mellitus  Patient blood glucose has been controlled during her hospitalization  Active Problems:   ESRD on hemodialysis   OSA (obstructive sleep apnea)   Anemia of chronic disease   Secondary hyperparathyroidism    Insulin-requiring or dependent type II diabetes mellitus   Acute respiratory failure with hypoxia   Hypercapnia   Acute pulmonary edema    Consultants:  Nephrology  PCCM  Procedures:  2-D echocardiogram  - Left ventricle: The cavity size was normal. Wall thickness was normal. Systolic function was normal. The estimated ejection fraction was in the range of 60% to 65%. Wall motion was normal; there were no regional wall motion abnormalities. - Tricuspid valve: There was mild-moderate regurgitation. - Pulmonary arteries: Systolic pressure was mildly increased. PA peak pressure: 44 mm Hg (S).  Cultures:  MRSA PCR positive  Antibiotics:  Levaquin 9/1 >>>9/5    Significant Tests:  See full reports for all details    Dg Chest Port 1 View  08/12/2014   CLINICAL DATA:  Evaluate airspace  EXAM: PORTABLE CHEST - 1 VIEW  COMPARISON:  08/11/2014  FINDINGS: Improved aeration of the lungs. Improved lung volume and decrease in patchy bibasilar airspace disease. Negative for edema or effusion.  IMPRESSION: Improved aeration of the lungs bilaterally.   Electronically Signed   By: Franchot Gallo M.D.   On: 08/12/2014 07:22   Dg Chest Port 1 View  08/11/2014   CLINICAL DATA:  Shortness of breath.  EXAM: PORTABLE CHEST - 1 VIEW  COMPARISON:  Single view of the chest 08/10/2014. And lateral chest 03/25/2014.  FINDINGS: There is cardiomegaly and mild interstitial edema. Discoid atelectasis is seen in the right mid lung zone and left lung base. No pneumothorax or pleural effusion is identified.  IMPRESSION: Cardiomegaly with mild interstitial edema and scattered atelectasis.   Electronically Signed   By: Inge Rise M.D.   On: 08/11/2014 07:10   Dg Chest Portable 1 View  08/10/2014   CLINICAL DATA:  Shortness of breath and congestion  EXAM: PORTABLE CHEST - 1 VIEW  COMPARISON:  03/25/2014  FINDINGS: There is diffuse interstitial coarsening with bronchial cuffing. No overt pulmonary edema. No effusion,  asymmetric opacity, or pneumothorax.  Normal heart size for technique. A SVC stent is again noted. Unchanged appearance of the mildly widened upper mediastinum, accentuated by portable technique.  IMPRESSION: Diffuse interstitial coarsening which could be congestive or bronchitic.   Electronically Signed   By: Jorje Guild M.D.   On: 08/10/2014 07:15     Today   Subjective:   [name] today has no headache,no chest abdominal pain,no new weakness tingling or numbness, feels much better wants to go home today. Elevated her hemodialysis well today.  Objective:   Blood pressure 92/35, pulse 61, temperature 98 F (36.7 C), temperature source Oral, resp. rate 19, height 5\' 2"  (1.575 m), weight 120.4 kg (265 lb 6.9 oz), SpO2 97.00%.  Intake/Output Summary (Last 24 hours) at 08/14/14 1425 Last data filed at 08/14/14 1307  Gross per 24 hour  Intake    590 ml  Output   2900 ml  Net  -2310 ml    Exam Awake Alert, Oriented *3, No new F.N deficits, Normal affect [place].AT,PERRAL Supple Neck,No JVD, No cervical lymphadenopathy appriciated.  Symmetrical Chest wall movement, Good air movement bilaterally, CTAB RRR,No Gallops,Rubs or  new Murmurs, No Parasternal Heave +ve B.Sounds, Abd Soft, Non tender, No organomegaly appriciated, No rebound -guarding or rigidity. No Cyanosis, Clubbing or edema, No new Rash or bruise  Data Review   Cultures -   CBC w Diff: Lab Results  Component Value Date   WBC 7.8 08/14/2014   HGB 12.0 08/14/2014   HCT 37.1 08/14/2014   PLT 195 08/14/2014   LYMPHOPCT 12 03/25/2014   MONOPCT 7 03/25/2014   EOSPCT 3 03/25/2014   BASOPCT 0 03/25/2014   CMP: Lab Results  Component Value Date   NA 137 08/14/2014   K 3.5* 08/14/2014   CL 96 08/14/2014   CO2 24 08/14/2014   BUN 47* 08/14/2014   CREATININE 5.47* 08/14/2014   PROT 8.3 08/11/2014   ALBUMIN 3.7 08/14/2014   BILITOT 0.4 08/11/2014   ALKPHOS 188* 08/11/2014   AST 25 08/11/2014   ALT 15 08/11/2014  .  Micro Results Recent Results  (from the past 240 hour(s))  MRSA PCR SCREENING     Status: Abnormal   Collection Time    08/10/14  7:27 PM      Result Value Ref Range Status   MRSA by PCR POSITIVE (*) NEGATIVE Final   Comment:            The GeneXpert MRSA Assay (FDA     approved for NASAL specimens     only), is one component of a     comprehensive MRSA colonization     surveillance program. It is not     intended to diagnose MRSA     infection nor to guide or     monitor treatment for     MRSA infections.     RESULT CALLED TO, READ BACK BY AND VERIFIED WITH:     Dulcy Fanny RN [phone] 08/10/14 A BROWNING        Follow-up Information   Follow up with GARBA,LAWAL, MD. Schedule an appointment as soon as possible for a visit in 1 week.   Specialty:  Internal Medicine   Contact information:   [address] [place] [address] [phone]       Discharge Medications     Medication List    STOP taking these medications       predniSONE 20 MG tablet  Commonly known as:  DELTASONE      TAKE these medications       albuterol 108 (90 BASE) MCG/ACT inhaler  Commonly known as:  PROVENTIL HFA;VENTOLIN HFA  Inhale 2 puffs into the lungs every 6 (six) hours as needed. For shortness of breath.     calcium acetate 667 MG capsule  Commonly known as:  PHOSLO  Take 1,334 mg by mouth 3 (three) times daily with meals.     clonazePAM 1 MG tablet  Commonly known as:  KLONOPIN  Take 1 mg by mouth at bedtime.     insulin glargine 100 UNIT/ML injection  Commonly known as:  LANTUS  Inject 0.15 mLs (15 Units total) into the skin at bedtime.     levofloxacin 500 MG tablet  Commonly known as:  LEVAQUIN  Take 1 tablet (500 mg total) by mouth every other day.     methylPREDNIsolone 4 MG tablet  Commonly known as:  MEDROL DOSPACK  follow package directions     metoCLOPramide 5 MG tablet  Commonly known as:  REGLAN  Take 5 mg by mouth at bedtime.     midodrine 5 MG tablet  Commonly known  as:  PROAMATINE   Take 5 mg by mouth 2 (two) times daily.     multivitamins ther. w/minerals Tabs tablet  Take 1 tablet by mouth daily.     NOVOLOG FLEXPEN 100 UNIT/ML injection  Generic drug:  insulin aspart  Inject 12-15 Units into the skin 3 (three) times daily before meals. Per sliding scale     TUMS ULTRA 1000 400 MG tablet  Generic drug:  calcium elemental as carbonate  Chew 1,000 mg by mouth daily.         Total Time in preparing paper work, data evaluation and todays exam - 35 minutes  Vidit Boissonneault M.D on 08/14/2014 at 2:25 PM  [place]  [phone]

## 2014-08-14 NOTE — Discharge Instructions (Signed)
Follow with Primary MD Barbette Merino, MD in 7 days   Get CBC, CMP, 2 view Chest X ray checked  by Primary MD next visit.    Activity: As tolerated with Full fall precautions use walker/cane & assistance as needed   Disposition Home    Diet: Renal dialysis diet , feeding assistance and aspiration precautions if needed.  For Heart failure patients - Check your Weight same time everyday, if you gain over 2 pounds, or you develop in leg swelling, experience more shortness of breath or chest pain, call your Primary MD immediately. Follow Cardiac Low Salt Diet and 1.8 lit/day fluid restriction.   On your next visit with her primary care physician please Get Medicines reviewed and adjusted.  Please request your Prim.MD to go over all Hospital Tests and Procedure/Radiological results at the follow up, please get all Hospital records sent to your Prim MD by signing hospital release before you go home.   If you experience worsening of your admission symptoms, develop shortness of breath, life threatening emergency, suicidal or homicidal thoughts you must seek medical attention immediately by calling 911 or calling your MD immediately  if symptoms less severe.  You Must read complete instructions/literature along with all the possible adverse reactions/side effects for all the Medicines you take and that have been prescribed to you. Take any new Medicines after you have completely understood and accpet all the possible adverse reactions/side effects.   Do not drive, operating heavy machinery, perform activities at heights, swimming or participation in water activities or provide baby sitting services if your were admitted for syncope or siezures until you have seen by Primary MD or a Neurologist and advised to do so again.  Do not drive when taking Pain medications.    Do not take more than prescribed Pain, Sleep and Anxiety Medications  Special Instructions: If you have smoked or chewed Tobacco   in the last 2 yrs please stop smoking, stop any regular Alcohol  and or any Recreational drug use.  Wear Seat belts while driving.   Please note  You were cared for by a hospitalist during your hospital stay. If you have any questions about your discharge medications or the care you received while you were in the hospital after you are discharged, you can call the unit and asked to speak with the hospitalist on call if the hospitalist that took care of you is not available. Once you are discharged, your primary care physician will handle any further medical issues. Please note that NO REFILLS for any discharge medications will be authorized once you are discharged, as it is imperative that you return to your primary care physician (or establish a relationship with a primary care physician if you do not have one) for your aftercare needs so that they can reassess your need for medications and monitor your lab values.

## 2014-08-17 LAB — GLUCOSE, CAPILLARY: Glucose-Capillary: 191 mg/dL — ABNORMAL HIGH (ref 70–99)

## 2014-08-18 DIAGNOSIS — D689 Coagulation defect, unspecified: Secondary | ICD-10-CM | POA: Insufficient documentation

## 2014-10-23 DIAGNOSIS — Z4802 Encounter for removal of sutures: Secondary | ICD-10-CM | POA: Insufficient documentation

## 2014-11-26 ENCOUNTER — Emergency Department (HOSPITAL_COMMUNITY)
Admission: EM | Admit: 2014-11-26 | Discharge: 2014-11-26 | Disposition: A | Discharge status: Home / Self Care | Payer: Medicare Other | Attending: Emergency Medicine | Admitting: Emergency Medicine

## 2014-11-26 ENCOUNTER — Encounter (HOSPITAL_COMMUNITY): Payer: Self-pay | Admitting: *Deleted

## 2014-11-26 ENCOUNTER — Emergency Department (HOSPITAL_COMMUNITY): Payer: Medicare Other

## 2014-11-26 DIAGNOSIS — I9589 Other hypotension: Secondary | ICD-10-CM | POA: Diagnosis not present

## 2014-11-26 DIAGNOSIS — G4733 Obstructive sleep apnea (adult) (pediatric): Secondary | ICD-10-CM | POA: Diagnosis not present

## 2014-11-26 DIAGNOSIS — Z8701 Personal history of pneumonia (recurrent): Secondary | ICD-10-CM | POA: Diagnosis not present

## 2014-11-26 DIAGNOSIS — E119 Type 2 diabetes mellitus without complications: Secondary | ICD-10-CM | POA: Insufficient documentation

## 2014-11-26 DIAGNOSIS — I120 Hypertensive chronic kidney disease with stage 5 chronic kidney disease or end stage renal disease: Secondary | ICD-10-CM | POA: Diagnosis not present

## 2014-11-26 DIAGNOSIS — N186 End stage renal disease: Secondary | ICD-10-CM | POA: Insufficient documentation

## 2014-11-26 DIAGNOSIS — K219 Gastro-esophageal reflux disease without esophagitis: Secondary | ICD-10-CM | POA: Insufficient documentation

## 2014-11-26 DIAGNOSIS — R2242 Localized swelling, mass and lump, left lower limb: Secondary | ICD-10-CM | POA: Diagnosis present

## 2014-11-26 DIAGNOSIS — Z794 Long term (current) use of insulin: Secondary | ICD-10-CM | POA: Insufficient documentation

## 2014-11-26 DIAGNOSIS — Z992 Dependence on renal dialysis: Secondary | ICD-10-CM | POA: Insufficient documentation

## 2014-11-26 DIAGNOSIS — Z862 Personal history of diseases of the blood and blood-forming organs and certain disorders involving the immune mechanism: Secondary | ICD-10-CM | POA: Diagnosis not present

## 2014-11-26 DIAGNOSIS — J449 Chronic obstructive pulmonary disease, unspecified: Secondary | ICD-10-CM | POA: Insufficient documentation

## 2014-11-26 DIAGNOSIS — Z9981 Dependence on supplemental oxygen: Secondary | ICD-10-CM | POA: Insufficient documentation

## 2014-11-26 DIAGNOSIS — Z79899 Other long term (current) drug therapy: Secondary | ICD-10-CM | POA: Insufficient documentation

## 2014-11-26 DIAGNOSIS — Z880 Allergy status to penicillin: Secondary | ICD-10-CM | POA: Diagnosis not present

## 2014-11-26 DIAGNOSIS — R609 Edema, unspecified: Secondary | ICD-10-CM

## 2014-11-26 DIAGNOSIS — L03115 Cellulitis of right lower limb: Secondary | ICD-10-CM

## 2014-11-26 DIAGNOSIS — Z86711 Personal history of pulmonary embolism: Secondary | ICD-10-CM | POA: Diagnosis not present

## 2014-11-26 MED ORDER — DOXYCYCLINE HYCLATE 100 MG PO CAPS: 100.0000 mg | ORAL_CAPSULE | Freq: Two times a day (BID) | ORAL | Status: DC

## 2014-11-26 NOTE — ED Notes (Addendum)
Pt states she woke up this morning her right foot was swollen and tender to touch. Reports no hx of this before. Dialysis pt, last had dialysis yesterday. Pedal pulses present bilaterally. Non pitting edema noted. Pt denies trauma to foot, denies falling.

## 2014-11-26 NOTE — Discharge Instructions (Signed)
Please follow the directions provided.  Be sure to follow-up with your primary care provider regarding the swelling in your foot.  Start the antibiotic as directed.  If the pain or swelling in your foot changes or worsens in anyway, don't hesitate to return.    SEEK MEDICAL CARE IF:  You notice red streaks coming from the infected area.  Your red area gets larger or turns dark in color.  Your bone or joint underneath the infected area becomes painful after the skin has healed.  Your infection returns in the same area or another area.  You notice a swollen bump in the infected area.  You develop new symptoms.  You have a fever. SEEK IMMEDIATE MEDICAL CARE IF:  You feel very sleepy.  You develop vomiting or diarrhea.  You have a general ill feeling (malaise) with muscle aches and pains.

## 2014-11-26 NOTE — ED Notes (Signed)
Pt reports waking up this am with swelling and pain to right foot and ankle, denies injury. Dialysis pt, last treatment was yesterday. No acute distress noted at triage.

## 2014-11-26 NOTE — ED Provider Notes (Signed)
CSN: [id number]     Arrival date & time 11/26/14  1320 History   First MD Initiated Contact with Patient 11/26/14 1432     Chief Complaint  Patient presents with  . Foot Pain  . Leg Swelling    (Consider location/radiation/quality/duration/timing/severity/associated sxs/prior Treatment) HPI [name] is a 53 yo female presenting with report of swelling and pain to her rt foot onset this am.  She noticed it was bothering her last night while she was sleeping.  She has been able to walk and notes the plantar aspect of the foot doesn't seem to hurt, only the dorsum of the foot. She describes the pain as aching and rates it as 8/10. She doesn't recall an injury or wound to the foot.  She denies any fevers, chills, altered sensation.  Past Medical History  Diagnosis Date  . HTN (hypertension)   . Obesities, morbid   . GERD (gastroesophageal reflux disease)   . PE (pulmonary embolism) ~ 2010  . Superior vena cava syndrome     Archie Endo 03/26/2013  . Pneumonia     "now & once a long time ago" (03/26/2013)  . Shortness of breath     "just a little bit; at any time" (03/26/2013)  . OSA on CPAP   . Iron deficiency anemia   . Chronic hypotension   . COPD (chronic obstructive pulmonary disease)   . ESRD (end stage renal disease) on dialysis     "TTS; [place]; (03/25/2014)  . Diabetes mellitus type 2 in obese     insulin dependent   Past Surgical History  Procedure Laterality Date  . Vascular surgery    . Thrombectomy and revision of arterioventous (av) goretex  graft Left ~ 12/2012    "thigh"   . Arteriovenous graft placement Left ~ 2011    "thigh" (4  . Carpal tunnel release Right ~ 2011   Family History  Problem Relation Age of Onset  . Diabetes Mellitus II Mother   . Hypertension Mother   . Hypertension Father   . Diabetes Mellitus II Father    History  Substance Use Topics  . Smoking status: Never Smoker   . Smokeless tobacco: Never Used  . Alcohol Use: No   OB  History    No data available     Review of Systems  Constitutional: Negative for fever and chills.  HENT: Negative for sore throat.   Eyes: Negative for visual disturbance.  Respiratory: Negative for cough and shortness of breath.   Cardiovascular: Positive for leg swelling. Negative for chest pain.  Gastrointestinal: Negative for nausea, vomiting and diarrhea.  Genitourinary: Negative for dysuria.  Musculoskeletal: Positive for myalgias.  Skin: Positive for color change. Negative for rash.  Neurological: Negative for weakness, numbness and headaches.    Allergies  Amoxicillin and Iohexol  Home Medications   Prior to Admission medications   Medication Sig Start Date End Date Taking? Authorizing Provider  albuterol (PROVENTIL HFA;VENTOLIN HFA) 108 (90 BASE) MCG/ACT inhaler Inhale 2 puffs into the lungs every 6 (six) hours as needed. For shortness of breath. 03/27/14   Jadelyn Oats, MD  calcium acetate (PHOSLO) 667 MG capsule Take 1,334 mg by mouth 3 (three) times daily with meals.     Historical Provider, MD  calcium elemental as carbonate (TUMS ULTRA 1000) 400 MG tablet Chew 1,000 mg by mouth daily.    Historical Provider, MD  clonazePAM (KLONOPIN) 1 MG tablet Take 1 mg by mouth at bedtime.  Historical Provider, MD  insulin aspart (NOVOLOG FLEXPEN) 100 UNIT/ML injection Inject 12-15 Units into the skin 3 (three) times daily before meals. Per sliding scale    Historical Provider, MD  insulin glargine (LANTUS) 100 UNIT/ML injection Inject 0.15 mLs (15 Units total) into the skin at bedtime. 03/31/13   Geradine Girt, DO  methylPREDNIsolone (MEDROL DOSPACK) 4 MG tablet follow package directions 08/14/14   Phillips Climes, MD  metoCLOPramide (REGLAN) 5 MG tablet Take 5 mg by mouth at bedtime.      Historical Provider, MD  midodrine (PROAMATINE) 5 MG tablet Take 5 mg by mouth 2 (two) times daily.      Historical Provider, MD  Multiple Vitamins-Minerals (MULTIVITAMINS THER. W/MINERALS)  TABS Take 1 tablet by mouth daily.      Historical Provider, MD   BP 95/58 mmHg  Pulse 77  Temp(Src) 97.8 F (36.6 C) (Oral)  Resp 18  SpO2 98% Physical Exam  Constitutional: She appears well-developed and well-nourished. No distress.  HENT:  Head: Normocephalic and atraumatic.  Mouth/Throat: Oropharynx is clear and moist. No oropharyngeal exudate.  Eyes: Conjunctivae are normal.  Neck: Neck supple. No thyromegaly present.  Cardiovascular: Normal rate, regular rhythm and intact distal pulses.   Pulmonary/Chest: Effort normal and breath sounds normal. No respiratory distress. She has no wheezes. She has no rales. She exhibits no tenderness.  Abdominal: Soft. There is no tenderness.  Musculoskeletal: She exhibits no tenderness.       Right ankle: She exhibits normal range of motion.  Lymphadenopathy:    She has no cervical adenopathy.  Neurological: She is alert.  Skin: Skin is warm and dry. No rash noted. She is not diaphoretic. There is erythema.     Psychiatric: She has a normal mood and affect.  Nursing note and vitals reviewed.   ED Course  Procedures (including critical care time) Labs Review Labs Reviewed - No data to display  Imaging Review Dg Foot Complete Right  11/26/2014   CLINICAL DATA:  Patient with superior foot swelling. No known injury.  EXAM: RIGHT FOOT COMPLETE - 3+ VIEW  COMPARISON:  Right ankle radiograph 04/03/2010  FINDINGS: Soft tissue swelling about the dorsum of the right foot and along the ventral aspect of the ankle. Vascular calcifications. Normal anatomic alignment. Midfoot degenerative changes. No evidence for acute fracture or dislocation.  IMPRESSION: Soft tissue swelling about the dorsum of the foot.  No evidence for acute underlying fracture.   Electronically Signed   By: Lovey Newcomer M.D.   On: 11/26/2014 15:58     EKG Interpretation None      MDM   Final diagnoses:  Swelling  Cellulitis of right lower extremity   53 yo with redness  and swelling affecting the dorsum of rt foot. She has good ROM without pain of her ankle. Consider cellulitis as most likely cause, also on differential but less likely, DVT as pt is currently taking lovenox injections.  Discussed case with Dr. Reather Converse.  Xray negative for acute abnormality.  Pt is well-appearing, alert and in no acute distress. Her blood pressure at my measurement just prior to her discharge, not documented in the nursing notes below, was at her reported baseline of 99/70.  She is able to walk without difficulty.  She appears safe to be discharged.  Discharge include follow-up with their PCP and prescription of keflex.  Return precautions provided. Pt aware of plan and in agreement.      Filed Vitals:   11/26/14 1343  11/26/14 1345 11/26/14 1515 11/26/14 1530  BP:  95/58 99/70 83/50   Pulse: 77  85 78  Temp: 97.8 F (36.6 C)     TempSrc: Oral     Resp: 18     SpO2: 98%  99% 100%   Meds given in ED:  Medications - No data to display  Discharge Medication List as of 11/26/2014  4:09 PM    START taking these medications   Details  doxycycline (VIBRAMYCIN) 100 MG capsule Take 1 capsule (100 mg total) by mouth 2 (two) times daily., Starting 11/26/2014, Until Discontinued, Print           Britt Bottom, NP 11/27/14 2007  Mariea Clonts, MD 11/28/14 [phone]

## 2014-12-10 DIAGNOSIS — T8859XA Other complications of anesthesia, initial encounter: Secondary | ICD-10-CM

## 2014-12-10 HISTORY — DX: Other complications of anesthesia, initial encounter: T88.59XA

## 2015-01-25 ENCOUNTER — Encounter (HOSPITAL_COMMUNITY): Payer: Self-pay

## 2015-01-25 ENCOUNTER — Encounter (HOSPITAL_COMMUNITY)
Admission: RE | Admit: 2015-01-25 | Discharge: 2015-01-25 | Disposition: A | Discharge status: Home / Self Care | Payer: Medicare Other | Source: Ambulatory Visit | Attending: Orthopedic Surgery | Admitting: Orthopedic Surgery

## 2015-01-25 DIAGNOSIS — I120 Hypertensive chronic kidney disease with stage 5 chronic kidney disease or end stage renal disease: Secondary | ICD-10-CM | POA: Diagnosis not present

## 2015-01-25 DIAGNOSIS — E119 Type 2 diabetes mellitus without complications: Secondary | ICD-10-CM | POA: Diagnosis not present

## 2015-01-25 DIAGNOSIS — J189 Pneumonia, unspecified organism: Secondary | ICD-10-CM | POA: Diagnosis not present

## 2015-01-25 DIAGNOSIS — K219 Gastro-esophageal reflux disease without esophagitis: Secondary | ICD-10-CM | POA: Diagnosis not present

## 2015-01-25 DIAGNOSIS — Z6831 Body mass index (BMI) 31.0-31.9, adult: Secondary | ICD-10-CM | POA: Diagnosis not present

## 2015-01-25 DIAGNOSIS — Z794 Long term (current) use of insulin: Secondary | ICD-10-CM | POA: Diagnosis not present

## 2015-01-25 DIAGNOSIS — Z992 Dependence on renal dialysis: Secondary | ICD-10-CM | POA: Diagnosis not present

## 2015-01-25 DIAGNOSIS — Z86711 Personal history of pulmonary embolism: Secondary | ICD-10-CM | POA: Diagnosis not present

## 2015-01-25 DIAGNOSIS — Z888 Allergy status to other drugs, medicaments and biological substances status: Secondary | ICD-10-CM | POA: Diagnosis not present

## 2015-01-25 DIAGNOSIS — D509 Iron deficiency anemia, unspecified: Secondary | ICD-10-CM | POA: Diagnosis not present

## 2015-01-25 DIAGNOSIS — J449 Chronic obstructive pulmonary disease, unspecified: Secondary | ICD-10-CM | POA: Diagnosis not present

## 2015-01-25 DIAGNOSIS — G5601 Carpal tunnel syndrome, right upper limb: Secondary | ICD-10-CM | POA: Diagnosis not present

## 2015-01-25 DIAGNOSIS — N186 End stage renal disease: Secondary | ICD-10-CM | POA: Diagnosis not present

## 2015-01-25 DIAGNOSIS — G4733 Obstructive sleep apnea (adult) (pediatric): Secondary | ICD-10-CM | POA: Diagnosis not present

## 2015-01-25 DIAGNOSIS — Z881 Allergy status to other antibiotic agents status: Secondary | ICD-10-CM | POA: Diagnosis not present

## 2015-01-25 HISTORY — DX: Adverse effect of unspecified anesthetic, initial encounter: T41.45XA

## 2015-01-25 LAB — CBC
HEMATOCRIT: 41.4 % (ref 36.0–46.0)
Hemoglobin: 13 g/dL (ref 12.0–15.0)
MCH: 28.9 pg (ref 26.0–34.0)
MCHC: 31.4 g/dL (ref 30.0–36.0)
MCV: 92 fL (ref 78.0–100.0)
Platelets: 163 10*3/uL (ref 150–400)
RBC: 4.5 MIL/uL (ref 3.87–5.11)
RDW: 13.8 % (ref 11.5–15.5)
WBC: 7.1 10*3/uL (ref 4.0–10.5)

## 2015-01-25 LAB — BASIC METABOLIC PANEL
Anion gap: 12 (ref 5–15)
BUN: 14 mg/dL (ref 6–23)
CALCIUM: 8.8 mg/dL (ref 8.4–10.5)
CO2: 26 mmol/L (ref 19–32)
Chloride: 98 mmol/L (ref 96–112)
Creatinine, Ser: 4.2 mg/dL — ABNORMAL HIGH (ref 0.50–1.10)
GFR calc Af Amer: 13 mL/min — ABNORMAL LOW (ref >90)
GFR, EST NON AFRICAN AMERICAN: 11 mL/min — AB (ref >90)
Glucose, Bld: 149 mg/dL — ABNORMAL HIGH (ref 70–99)
Potassium: 3.4 mmol/L — ABNORMAL LOW (ref 3.5–5.1)
SODIUM: 136 mmol/L (ref 135–145)

## 2015-01-25 LAB — SURGICAL PCR SCREEN
MRSA, PCR: POSITIVE — AB
Staphylococcus aureus: POSITIVE — AB

## 2015-01-25 NOTE — Pre-Procedure Instructions (Signed)
[name]  01/25/2015   Your procedure is scheduled on:  01/28/15  Report to [facility] Admitting at 1245 pm  Call this number if you have problems the morning of surgery: [phone]   Remember:   Do not eat food or drink liquids after midnight.   Take these medicines the morning of surgery with A SIP OF WATER: all inhalers   Do not wear jewelry, make-up or nail polish.  Do not wear lotions, powders, or perfumes. You may wear deodorant.  Do not shave 48 hours prior to surgery. Men may shave face and neck.  Do not bring valuables to the hospital.  [facility] is not responsible                  for any belongings or valuables.               Contacts, dentures or bridgework may not be worn into surgery.  Leave suitcase in the car. After surgery it may be brought to your room.  For patients admitted to the hospital, discharge time is determined by your                treatment team.               Patients discharged the day of surgery will not be allowed to drive  home.  Name and phone number of your driver: family  Special Instructions: Shower using CHG 2 nights before surgery and the night before surgery.  If you shower the day of surgery use CHG.  Use special wash - you have one bottle of CHG for all showers.  You should use approximately 1/3 of the bottle for each shower.   Please read over the following fact sheets that you were given: Pain Booklet, Coughing and Deep Breathing and Surgical Site Infection Prevention

## 2015-01-28 ENCOUNTER — Ambulatory Visit (HOSPITAL_COMMUNITY): Payer: Medicare Other | Admitting: Emergency Medicine

## 2015-01-28 ENCOUNTER — Ambulatory Visit (HOSPITAL_COMMUNITY)
Admission: RE | Admit: 2015-01-28 | Discharge: 2015-01-28 | Disposition: A | Discharge status: Home / Self Care | Payer: Medicare Other | Source: Ambulatory Visit | Attending: Orthopedic Surgery | Admitting: Orthopedic Surgery

## 2015-01-28 ENCOUNTER — Encounter (HOSPITAL_COMMUNITY): Payer: Self-pay | Admitting: *Deleted

## 2015-01-28 ENCOUNTER — Ambulatory Visit (HOSPITAL_COMMUNITY): Payer: Medicare Other | Admitting: Anesthesiology

## 2015-01-28 ENCOUNTER — Encounter (HOSPITAL_COMMUNITY)
Admission: RE | Disposition: A | Discharge status: Home / Self Care | Payer: Self-pay | Source: Ambulatory Visit | Attending: Orthopedic Surgery

## 2015-01-28 DIAGNOSIS — Z6831 Body mass index (BMI) 31.0-31.9, adult: Secondary | ICD-10-CM | POA: Insufficient documentation

## 2015-01-28 DIAGNOSIS — Z888 Allergy status to other drugs, medicaments and biological substances status: Secondary | ICD-10-CM | POA: Insufficient documentation

## 2015-01-28 DIAGNOSIS — D509 Iron deficiency anemia, unspecified: Secondary | ICD-10-CM | POA: Insufficient documentation

## 2015-01-28 DIAGNOSIS — K219 Gastro-esophageal reflux disease without esophagitis: Secondary | ICD-10-CM | POA: Diagnosis not present

## 2015-01-28 DIAGNOSIS — E119 Type 2 diabetes mellitus without complications: Secondary | ICD-10-CM | POA: Insufficient documentation

## 2015-01-28 DIAGNOSIS — G4733 Obstructive sleep apnea (adult) (pediatric): Secondary | ICD-10-CM | POA: Insufficient documentation

## 2015-01-28 DIAGNOSIS — Z86711 Personal history of pulmonary embolism: Secondary | ICD-10-CM | POA: Insufficient documentation

## 2015-01-28 DIAGNOSIS — J189 Pneumonia, unspecified organism: Secondary | ICD-10-CM | POA: Insufficient documentation

## 2015-01-28 DIAGNOSIS — G5601 Carpal tunnel syndrome, right upper limb: Secondary | ICD-10-CM | POA: Diagnosis not present

## 2015-01-28 DIAGNOSIS — I120 Hypertensive chronic kidney disease with stage 5 chronic kidney disease or end stage renal disease: Secondary | ICD-10-CM | POA: Insufficient documentation

## 2015-01-28 DIAGNOSIS — N186 End stage renal disease: Secondary | ICD-10-CM | POA: Insufficient documentation

## 2015-01-28 DIAGNOSIS — Z992 Dependence on renal dialysis: Secondary | ICD-10-CM | POA: Insufficient documentation

## 2015-01-28 DIAGNOSIS — Z881 Allergy status to other antibiotic agents status: Secondary | ICD-10-CM | POA: Insufficient documentation

## 2015-01-28 DIAGNOSIS — J449 Chronic obstructive pulmonary disease, unspecified: Secondary | ICD-10-CM | POA: Insufficient documentation

## 2015-01-28 DIAGNOSIS — Z794 Long term (current) use of insulin: Secondary | ICD-10-CM | POA: Insufficient documentation

## 2015-01-28 HISTORY — PX: CARPAL TUNNEL RELEASE: SHX101

## 2015-01-28 LAB — GLUCOSE, CAPILLARY: GLUCOSE-CAPILLARY: 97 mg/dL (ref 70–99)

## 2015-01-28 LAB — POCT I-STAT 4, (NA,K, GLUC, HGB,HCT)
Glucose, Bld: 112 mg/dL — ABNORMAL HIGH (ref 70–99)
HCT: 45 % (ref 36.0–46.0)
Hemoglobin: 15.3 g/dL — ABNORMAL HIGH (ref 12.0–15.0)
POTASSIUM: 4 mmol/L (ref 3.5–5.1)
Sodium: 139 mmol/L (ref 135–145)

## 2015-01-28 SURGERY — CARPAL TUNNEL RELEASE
Anesthesia: Monitor Anesthesia Care | Site: Wrist | Laterality: Right

## 2015-01-28 MED ORDER — FENTANYL CITRATE 0.05 MG/ML IJ SOLN
INTRAMUSCULAR | Status: AC
  Filled 2015-01-28: qty 5

## 2015-01-28 MED ORDER — FENTANYL CITRATE 0.05 MG/ML IJ SOLN
INTRAMUSCULAR | Status: DC | PRN
  Administered 2015-01-28 (×2): 25 ug via INTRAVENOUS

## 2015-01-28 MED ORDER — LIDOCAINE HCL (PF) 1 % IJ SOLN
INTRAMUSCULAR | Status: DC | PRN
  Administered 2015-01-28: 10 mL

## 2015-01-28 MED ORDER — MEPERIDINE HCL 25 MG/ML IJ SOLN: 6.2500 mg | INTRAMUSCULAR | Status: DC | PRN

## 2015-01-28 MED ORDER — VANCOMYCIN HCL IN DEXTROSE 1-5 GM/200ML-% IV SOLN
INTRAVENOUS | Status: AC
  Filled 2015-01-28: qty 200

## 2015-01-28 MED ORDER — SODIUM CHLORIDE 0.9 % IV SOLN
INTRAVENOUS | Status: DC
  Administered 2015-01-28: 14:00:00 via INTRAVENOUS

## 2015-01-28 MED ORDER — SODIUM CHLORIDE 0.9 % IV SOLN
INTRAVENOUS | Status: DC | PRN
  Administered 2015-01-28: 16:00:00 via INTRAVENOUS

## 2015-01-28 MED ORDER — BUPIVACAINE HCL (PF) 0.25 % IJ SOLN
INTRAMUSCULAR | Status: AC
  Filled 2015-01-28: qty 30

## 2015-01-28 MED ORDER — OXYCODONE HCL 5 MG PO TABS: 5.0000 mg | ORAL_TABLET | ORAL | Status: DC | PRN

## 2015-01-28 MED ORDER — MIDAZOLAM HCL 2 MG/2ML IJ SOLN
INTRAMUSCULAR | Status: AC
  Filled 2015-01-28: qty 2

## 2015-01-28 MED ORDER — VANCOMYCIN HCL 1000 MG IV SOLR
500.0000 mg | INTRAVENOUS | Status: DC | PRN
  Administered 2015-01-28: 500 mg via INTRAVENOUS

## 2015-01-28 MED ORDER — SODIUM CHLORIDE 0.9 % IV SOLN
INTRAVENOUS | Status: DC | PRN
  Administered 2015-01-28: 15:00:00 via INTRAVENOUS

## 2015-01-28 MED ORDER — 0.9 % SODIUM CHLORIDE (POUR BTL) OPTIME
TOPICAL | Status: DC | PRN
  Administered 2015-01-28: 1000 mL

## 2015-01-28 MED ORDER — ONDANSETRON HCL 4 MG/2ML IJ SOLN: 4.0000 mg | Freq: Once | INTRAMUSCULAR | Status: DC | PRN

## 2015-01-28 MED ORDER — HYDROMORPHONE HCL 1 MG/ML IJ SOLN
INTRAMUSCULAR | Status: AC
  Filled 2015-01-28: qty 1

## 2015-01-28 MED ORDER — BUPIVACAINE HCL (PF) 0.25 % IJ SOLN
INTRAMUSCULAR | Status: DC | PRN
  Administered 2015-01-28: 10 mL

## 2015-01-28 MED ORDER — HYDROMORPHONE HCL 1 MG/ML IJ SOLN
0.2500 mg | INTRAMUSCULAR | Status: DC | PRN
  Administered 2015-01-28: 0.5 mg via INTRAVENOUS

## 2015-01-28 MED ORDER — MIDAZOLAM HCL 5 MG/5ML IJ SOLN
INTRAMUSCULAR | Status: DC | PRN
  Administered 2015-01-28: 0.5 mg via INTRAVENOUS

## 2015-01-28 MED ORDER — LIDOCAINE HCL (PF) 1 % IJ SOLN
INTRAMUSCULAR | Status: AC
  Filled 2015-01-28: qty 30

## 2015-01-28 SURGICAL SUPPLY — 51 items
BANDAGE ELASTIC 3 VELCRO ST LF (GAUZE/BANDAGES/DRESSINGS) ×4 IMPLANT
BANDAGE ELASTIC 4 VELCRO ST LF (GAUZE/BANDAGES/DRESSINGS) ×1 IMPLANT
BNDG CONFORM 3 STRL LF (GAUZE/BANDAGES/DRESSINGS) ×2 IMPLANT
BNDG GAUZE ELAST 4 BULKY (GAUZE/BANDAGES/DRESSINGS) ×5 IMPLANT
CORDS BIPOLAR (ELECTRODE) ×3 IMPLANT
COVER SURGICAL LIGHT HANDLE (MISCELLANEOUS) ×3 IMPLANT
CUFF TOURNIQUET SINGLE 18IN (TOURNIQUET CUFF) ×3 IMPLANT
CUFF TOURNIQUET SINGLE 24IN (TOURNIQUET CUFF) IMPLANT
DRAPE SURG 17X23 STRL (DRAPES) ×3 IMPLANT
EVACUATOR 1/8 PVC DRAIN (DRAIN) IMPLANT
GAUZE SPONGE 4X4 12PLY STRL (GAUZE/BANDAGES/DRESSINGS) ×3 IMPLANT
GAUZE XEROFORM 1X8 LF (GAUZE/BANDAGES/DRESSINGS) ×3 IMPLANT
GLOVE BIOGEL M STRL SZ7.5 (GLOVE) ×3 IMPLANT
GLOVE BIOGEL PI IND STRL 6.5 (GLOVE) IMPLANT
GLOVE BIOGEL PI IND STRL 7.0 (GLOVE) IMPLANT
GLOVE BIOGEL PI INDICATOR 6.5 (GLOVE) ×2
GLOVE BIOGEL PI INDICATOR 7.0 (GLOVE) ×2
GLOVE SS BIOGEL STRL SZ 8 (GLOVE) ×1 IMPLANT
GLOVE SUPERSENSE BIOGEL SZ 8 (GLOVE) ×2
GOWN STRL REUS W/ TWL LRG LVL3 (GOWN DISPOSABLE) ×2 IMPLANT
GOWN STRL REUS W/ TWL XL LVL3 (GOWN DISPOSABLE) ×3 IMPLANT
GOWN STRL REUS W/TWL LRG LVL3 (GOWN DISPOSABLE) ×6
GOWN STRL REUS W/TWL XL LVL3 (GOWN DISPOSABLE) ×3
KIT BASIN OR (CUSTOM PROCEDURE TRAY) ×3 IMPLANT
KIT ROOM TURNOVER OR (KITS) ×3 IMPLANT
LOOP VESSEL MAXI BLUE (MISCELLANEOUS) IMPLANT
NDL HYPO 25GX1X1/2 BEV (NEEDLE) IMPLANT
NEEDLE HYPO 25GX1X1/2 BEV (NEEDLE) ×3 IMPLANT
NS IRRIG 1000ML POUR BTL (IV SOLUTION) ×3 IMPLANT
PACK ORTHO EXTREMITY (CUSTOM PROCEDURE TRAY) ×3 IMPLANT
PAD ARMBOARD 7.5X6 YLW CONV (MISCELLANEOUS) ×6 IMPLANT
PAD CAST 4YDX4 CTTN HI CHSV (CAST SUPPLIES) ×2 IMPLANT
PADDING CAST COTTON 4X4 STRL (CAST SUPPLIES) ×3
SOLUTION BETADINE 4OZ (MISCELLANEOUS) ×3 IMPLANT
SPLINT FIBERGLASS 3X12 (CAST SUPPLIES) ×2 IMPLANT
SPONGE GAUZE 4X4 12PLY STER LF (GAUZE/BANDAGES/DRESSINGS) ×2 IMPLANT
SPONGE SCRUB IODOPHOR (GAUZE/BANDAGES/DRESSINGS) ×3 IMPLANT
SUCTION FRAZIER TIP 10 FR DISP (SUCTIONS) ×2 IMPLANT
SUT PROLENE 4 0 PS 2 18 (SUTURE) ×3 IMPLANT
SUT VIC AB 2-0 CT1 27 (SUTURE)
SUT VIC AB 2-0 CT1 TAPERPNT 27 (SUTURE) IMPLANT
SUT VIC AB 3-0 FS2 27 (SUTURE) IMPLANT
SYR CONTROL 10ML LL (SYRINGE) ×2 IMPLANT
SYSTEM CHEST DRAIN TLS 7FR (DRAIN) IMPLANT
TOWEL OR 17X24 6PK STRL BLUE (TOWEL DISPOSABLE) ×3 IMPLANT
TOWEL OR 17X26 10 PK STRL BLUE (TOWEL DISPOSABLE) ×3 IMPLANT
TUBE CONNECTING 12'X1/4 (SUCTIONS) ×1
TUBE CONNECTING 12X1/4 (SUCTIONS) ×1 IMPLANT
TUBE EVACUATION TLS (MISCELLANEOUS) ×3 IMPLANT
UNDERPAD 30X30 INCONTINENT (UNDERPADS AND DIAPERS) ×3 IMPLANT
WATER STERILE IRR 1000ML POUR (IV SOLUTION) ×3 IMPLANT

## 2015-01-28 NOTE — Anesthesia Preprocedure Evaluation (Addendum)
Anesthesia Evaluation  Patient identified by MRN, date of birth, ID band Patient awake    Reviewed: Allergy & Precautions, NPO status , Patient's Chart, lab work & pertinent test results, reviewed documented beta blocker date and time   Airway Mallampati: II  TM Distance: >3 FB Neck ROM: Full    Dental  (+) Teeth Intact   Pulmonary sleep apnea and Continuous Positive Airway Pressure Ventilation , COPD COPD inhaler,          Cardiovascular hypertension, Pt. on medications     Neuro/Psych    GI/Hepatic GERD-  Medicated and Controlled,  Endo/Other  diabetes, Type 2, Oral Hypoglycemic Agents, Insulin Dependent  Renal/GU Dialysis and CRFRenal diseaseT-Th-Sat HEMODIALYSIS via left leg     Musculoskeletal   Abdominal   Peds  Hematology   Anesthesia Other Findings   Reproductive/Obstetrics                            Anesthesia Physical Anesthesia Plan  ASA: III  Anesthesia Plan: MAC   Post-op Pain Management:    Induction: Intravenous  Airway Management Planned: Natural Airway  Additional Equipment:   Intra-op Plan:   Post-operative Plan:   Informed Consent: I have reviewed the patients History and Physical, chart, labs and discussed the procedure including the risks, benefits and alternatives for the proposed anesthesia with the patient or authorized representative who has indicated his/her understanding and acceptance.     Plan Discussed with: CRNA and Surgeon  Anesthesia Plan Comments:         Anesthesia Quick Evaluation

## 2015-01-28 NOTE — Transfer of Care (Signed)
Immediate Anesthesia Transfer of Care Note  Patient: [name]  Procedure(s) Performed: Procedure(s): RIGHT CARPAL TUNNEL RELEASE (Right)  Patient Location: PACU  Anesthesia Type:MAC  Level of Consciousness: awake, alert , oriented and patient cooperative  Airway & Oxygen Therapy: Patient Spontanous Breathing and Patient connected to nasal cannula oxygen  Post-op Assessment: Report given to RN, Post -op Vital signs reviewed and stable and Patient moving all extremities  Post vital signs: Reviewed and stable  Last Vitals:  Filed Vitals:   01/28/15 0817  BP: 160/85  Pulse: 100  Temp: 36.9 C  Resp: 20    Complications: No apparent anesthesia complications

## 2015-01-28 NOTE — Anesthesia Postprocedure Evaluation (Signed)
Anesthesia Post Note  Patient: [name]  Procedure(s) Performed: Procedure(s) (LRB): RIGHT CARPAL TUNNEL RELEASE (Right)  Anesthesia type: MAC  Patient location: PACU  Post pain: Pain level controlled  Post assessment: Patient's Cardiovascular Status Stable  Last Vitals:  Filed Vitals:   01/28/15 1715  BP:   Pulse: 78  Temp:   Resp: 13    Post vital signs: Reviewed and stable  Level of consciousness: awake  Complications: No apparent anesthesia complications

## 2015-01-28 NOTE — Op Note (Signed)
See dictation#  Shakila Mak M.D. 

## 2015-01-28 NOTE — H&P (Signed)
[name] is an 54 y.o. female.   Chief Complaint: Right CTS HPI: Patient presents for evaluation and treatment of the of their upper extremity predicament. The patient denies neck back chest or of abdominal pain. The patient notes that they have no lower extremity problems. The patient from primarily complains of the upper extremity pain noted.  Past Medical History  Diagnosis Date  . HTN (hypertension)   . Obesities, morbid   . GERD (gastroesophageal reflux disease)   . PE (pulmonary embolism) ~ 2010  . Superior vena cava syndrome     Archie Endo 03/26/2013  . Pneumonia     "now & once a long time ago" (03/26/2013)  . Shortness of breath     "just a little bit; at any time" (03/26/2013)  . OSA on CPAP   . Iron deficiency anemia   . Chronic hypotension   . COPD (chronic obstructive pulmonary disease)   . ESRD (end stage renal disease) on dialysis     "TTS; [place]; (03/25/2014)  . Diabetes mellitus type 2 in obese     insulin dependent  . Complication of anesthesia     confusion    Past Surgical History  Procedure Laterality Date  . Vascular surgery    . Thrombectomy and revision of arterioventous (av) goretex  graft Left ~ 12/2012    "thigh"   . Arteriovenous graft placement Left ~ 2011    "thigh" (4  . Carpal tunnel release Right ~ 2011    Family History  Problem Relation Age of Onset  . Diabetes Mellitus II Mother   . Hypertension Mother   . Hypertension Father   . Diabetes Mellitus II Father    Social History:  reports that she has never smoked. She has never used smokeless tobacco. She reports that she does not drink alcohol or use illicit drugs.  Allergies:  Allergies  Allergen Reactions  . Amoxicillin Anaphylaxis and Other (See Comments)    Throat closes and gets sweaty.  . Iohexol Other (See Comments)     Desc: scratchy/itchy throat/itching on back / nausea     Medications Prior to Admission  Medication Sig Dispense Refill  . albuterol (PROVENTIL  HFA;VENTOLIN HFA) 108 (90 BASE) MCG/ACT inhaler Inhale 2 puffs into the lungs every 6 (six) hours as needed. For shortness of breath. (Patient taking differently: Inhale 2 puffs into the lungs every 6 (six) hours as needed for wheezing or shortness of breath. ) 1 Inhaler 0  . B Complex-C (B-COMPLEX WITH VITAMIN C) tablet Take 1 tablet by mouth daily.    . calcium acetate (PHOSLO) 667 MG capsule Take 1,334 mg by mouth 3 (three) times daily with meals.     . insulin aspart (NOVOLOG FLEXPEN) 100 UNIT/ML injection Inject 12-15 Units into the skin 3 (three) times daily before meals. Per sliding scale    . insulin glargine (LANTUS) 100 UNIT/ML injection Inject 0.15 mLs (15 Units total) into the skin at bedtime. 10 mL 0  . methylPREDNIsolone (MEDROL DOSPACK) 4 MG tablet follow package directions 21 tablet 0  . metoCLOPramide (REGLAN) 5 MG tablet Take 5 mg by mouth at bedtime.      . midodrine (PROAMATINE) 5 MG tablet Take 5 mg by mouth 2 (two) times daily.      Marland Kitchen tiotropium (SPIRIVA) 18 MCG inhalation capsule Place 18 mcg into inhaler and inhale daily.    . calcium elemental as carbonate (TUMS ULTRA 1000) 400 MG tablet Chew 1,000 mg by mouth  daily.    . clonazePAM (KLONOPIN) 1 MG tablet Take 1 mg by mouth at bedtime.    Marland Kitchen doxycycline (VIBRAMYCIN) 100 MG capsule Take 1 capsule (100 mg total) by mouth 2 (two) times daily. (Patient not taking: Reported on 01/25/2015) 10 capsule 0  . levofloxacin (LEVAQUIN) 500 MG tablet Take 500 mg by mouth daily.      Results for orders placed or performed during the hospital encounter of 01/28/15 (from the past 48 hour(s))  I-STAT 4, (NA,K, GLUC, HGB,HCT)     Status: Abnormal   Collection Time: 01/28/15  1:13 PM  Result Value Ref Range   Sodium 139 135 - 145 mmol/L   Potassium 4.0 3.5 - 5.1 mmol/L   Glucose, Bld 112 (H) 70 - 99 mg/dL   HCT 45.0 36.0 - 46.0 %   Hemoglobin 15.3 (H) 12.0 - 15.0 g/dL   No results found.  Review of Systems  Respiratory: Negative.    Genitourinary: Negative.   Psychiatric/Behavioral: Negative.     Blood pressure 160/85, pulse 100, temperature 98.4 F (36.9 C), temperature source Oral, resp. rate 20, height 5\' 2"  (1.575 m), weight 77.111 kg (170 lb), SpO2 98 %. Physical Exam  R CTS  The patient is alert and oriented in no acute distress the patient complains of pain in the affected upper extremity.  The patient is noted to have a normal HEENT exam.  Lung fields show equal chest expansion and no shortness of breath  abdomen exam is nontender without distention.  Lower extremity examination does not show any fracture dislocation or blood clot symptoms.  Pelvis is stable neck and back are stable and nontender Assessment/Plan Plan R CTR  Amed Datta III,Brittanee Ghazarian M 01/28/2015, 3:19 PM

## 2015-01-28 NOTE — Discharge Instructions (Signed)
We recommend that you to take vitamin C 1000 mg a day to promote healing we also recommend that if you require her pain medicine that he take a stool softener to prevent constipation as most pain medicines will have constipation side effects. We recommend either Peri-Colace or Senokot and recommend that you also consider adding MiraLAX to prevent the constipation affects from pain medicine if you are required to use them. These medicines are over the counter and maybe purchased at a local pharmacy.    Keep bandage clean and dry.  Call for any problems.  No smoking.  Criteria for driving a car: you should be off your pain medicine for 7-8 hours, able to drive one handed(confident), thinking clearly and feeling able in your judgement to drive. Continue elevation as it will decrease swelling.  If instructed by MD move your fingers within the confines of the bandage/splint.  Use ice if instructed by your MD. Call immediately for any sudden loss of feeling in your hand/arm or change in functional abilities of the extremity.

## 2015-01-29 ENCOUNTER — Encounter (HOSPITAL_COMMUNITY): Payer: Self-pay | Admitting: Orthopedic Surgery

## 2015-01-29 NOTE — Op Note (Signed)
[name], [name]               ACCOUNT NO.:  [id number]  MEDICAL RECORD NO.:  [id number]  LOCATION:  [facility]                         FACILITY:  [facility]  PHYSICIAN:  Satira Anis. Reuel Lamadrid, M.D.DATE OF BIRTH:  1961/03/28  DATE OF PROCEDURE:  01/28/2015 DATE OF DISCHARGE:  01/28/2015                              OPERATIVE REPORT   PREOPERATIVE DIAGNOSIS:  Right carpal tunnel syndrome.  POSTOPERATIVE DIAGNOSIS:  Right carpal tunnel syndrome.  PROCEDURE: 1. Right median nerve/peripheral nerve block __________ purposes for     carpal tunnel release. 2. Right limited open carpal tunnel release.  SURGEON:  Satira Anis. Amedeo Plenty, M.D.  ASSISTANT:  None.  COMPLICATIONS:  None.  ANESTHESIA:  Peripheral nerve block with IV sedation keeping the patient awake, alert, and oriented the entire case.  OPERATIVE PROCEDURE:  The patient was seen by myself and Anesthesia, taken to the operative suite, underwent smooth induction of peripheral nerve/median nerve block.  Prepped and draped in usual sterile fashion. Time-out called.  Preoperative vancomycin was given at my request. Sterile field had been secured.  Time-out called and the patient then underwent a 2-cm incision at the __________ transcarpal ligament. Dissection was carried down, palmar fascia incised.  __________ transcarpal ligament released under 4.0 loupe magnification without difficulty.  She tolerated this well.  There were no complicating features.  Once this was complete, we then verified the distal release and noted the fat pad __________.  Following this, distal __________ proximal dissection was carried out __________ available for direct visual release proximally with the rhytidectomy scissors.  Portions of the antebrachial fascia were also released.  The median nerve was intact, hyperemic, and noted to have chronic perineural changes.  The canal thickness was very impressive in terms of the thickness of the transverse carpal  ligament.  There were no space-occupying lesions; however, this was limited open approach and deep canal inspection was not carried out.  She was irrigated and closed after hemostasis was secured.  The patient did beautifully.  There were no complicating features.  She was awake, alert, and oriented during the entire case.  We will monitor her condition closely.  Sterile dressing and volar splint was applied. No complicating features.  Pleasure to see her today and participate in her care.  We __________ postoperative recovery.  I will see her back in a week.  Standard postop algorithm will be adhered to.     Satira Anis. Amedeo Plenty, M.D.     [facility]  D:  01/28/2015  T:  01/29/2015  Job:  [id number]

## 2015-02-01 ENCOUNTER — Other Ambulatory Visit: Payer: Self-pay | Admitting: Orthopedic Surgery

## 2015-08-12 ENCOUNTER — Emergency Department (HOSPITAL_COMMUNITY): Payer: Medicare Other

## 2015-08-12 ENCOUNTER — Inpatient Hospital Stay (HOSPITAL_COMMUNITY)
Admission: EM | Admit: 2015-08-12 | Discharge: 2015-08-18 | DRG: 252 | Disposition: A | Discharge status: Home Health | Payer: Medicare Other | Attending: Internal Medicine | Admitting: Internal Medicine

## 2015-08-12 ENCOUNTER — Encounter (HOSPITAL_COMMUNITY): Payer: Self-pay | Admitting: Family Medicine

## 2015-08-12 DIAGNOSIS — R52 Pain, unspecified: Secondary | ICD-10-CM

## 2015-08-12 DIAGNOSIS — T827XXD Infection and inflammatory reaction due to other cardiac and vascular devices, implants and grafts, subsequent encounter: Secondary | ICD-10-CM | POA: Diagnosis not present

## 2015-08-12 DIAGNOSIS — N186 End stage renal disease: Secondary | ICD-10-CM

## 2015-08-12 DIAGNOSIS — E1122 Type 2 diabetes mellitus with diabetic chronic kidney disease: Secondary | ICD-10-CM | POA: Diagnosis present

## 2015-08-12 DIAGNOSIS — I9589 Other hypotension: Secondary | ICD-10-CM | POA: Diagnosis present

## 2015-08-12 DIAGNOSIS — Z7901 Long term (current) use of anticoagulants: Secondary | ICD-10-CM | POA: Diagnosis not present

## 2015-08-12 DIAGNOSIS — R7881 Bacteremia: Secondary | ICD-10-CM | POA: Diagnosis not present

## 2015-08-12 DIAGNOSIS — R0902 Hypoxemia: Secondary | ICD-10-CM

## 2015-08-12 DIAGNOSIS — I120 Hypertensive chronic kidney disease with stage 5 chronic kidney disease or end stage renal disease: Secondary | ICD-10-CM | POA: Diagnosis present

## 2015-08-12 DIAGNOSIS — A419 Sepsis, unspecified organism: Secondary | ICD-10-CM | POA: Diagnosis not present

## 2015-08-12 DIAGNOSIS — E1169 Type 2 diabetes mellitus with other specified complication: Secondary | ICD-10-CM

## 2015-08-12 DIAGNOSIS — Z113 Encounter for screening for infections with a predominantly sexual mode of transmission: Secondary | ICD-10-CM | POA: Insufficient documentation

## 2015-08-12 DIAGNOSIS — Z880 Allergy status to penicillin: Secondary | ICD-10-CM | POA: Diagnosis not present

## 2015-08-12 DIAGNOSIS — R6510 Systemic inflammatory response syndrome (SIRS) of non-infectious origin without acute organ dysfunction: Secondary | ICD-10-CM | POA: Diagnosis present

## 2015-08-12 DIAGNOSIS — A4101 Sepsis due to Methicillin susceptible Staphylococcus aureus: Secondary | ICD-10-CM | POA: Diagnosis present

## 2015-08-12 DIAGNOSIS — J9601 Acute respiratory failure with hypoxia: Secondary | ICD-10-CM | POA: Diagnosis not present

## 2015-08-12 DIAGNOSIS — R6521 Severe sepsis with septic shock: Secondary | ICD-10-CM | POA: Diagnosis present

## 2015-08-12 DIAGNOSIS — Z86711 Personal history of pulmonary embolism: Secondary | ICD-10-CM | POA: Diagnosis not present

## 2015-08-12 DIAGNOSIS — K219 Gastro-esophageal reflux disease without esophagitis: Secondary | ICD-10-CM | POA: Diagnosis present

## 2015-08-12 DIAGNOSIS — D509 Iron deficiency anemia, unspecified: Secondary | ICD-10-CM | POA: Diagnosis present

## 2015-08-12 DIAGNOSIS — L8990 Pressure ulcer of unspecified site, unspecified stage: Secondary | ICD-10-CM | POA: Diagnosis present

## 2015-08-12 DIAGNOSIS — Z794 Long term (current) use of insulin: Secondary | ICD-10-CM | POA: Diagnosis not present

## 2015-08-12 DIAGNOSIS — T827XXA Infection and inflammatory reaction due to other cardiac and vascular devices, implants and grafts, initial encounter: Secondary | ICD-10-CM | POA: Diagnosis present

## 2015-08-12 DIAGNOSIS — N2581 Secondary hyperparathyroidism of renal origin: Secondary | ICD-10-CM | POA: Diagnosis present

## 2015-08-12 DIAGNOSIS — Z6841 Body Mass Index (BMI) 40.0 and over, adult: Secondary | ICD-10-CM | POA: Diagnosis not present

## 2015-08-12 DIAGNOSIS — Z9989 Dependence on other enabling machines and devices: Secondary | ICD-10-CM

## 2015-08-12 DIAGNOSIS — I9581 Postprocedural hypotension: Secondary | ICD-10-CM | POA: Diagnosis not present

## 2015-08-12 DIAGNOSIS — B958 Unspecified staphylococcus as the cause of diseases classified elsewhere: Secondary | ICD-10-CM | POA: Insufficient documentation

## 2015-08-12 DIAGNOSIS — G4733 Obstructive sleep apnea (adult) (pediatric): Secondary | ICD-10-CM | POA: Diagnosis present

## 2015-08-12 DIAGNOSIS — Z992 Dependence on renal dialysis: Secondary | ICD-10-CM | POA: Diagnosis not present

## 2015-08-12 DIAGNOSIS — E11649 Type 2 diabetes mellitus with hypoglycemia without coma: Secondary | ICD-10-CM | POA: Diagnosis present

## 2015-08-12 DIAGNOSIS — IMO0001 Reserved for inherently not codable concepts without codable children: Secondary | ICD-10-CM | POA: Insufficient documentation

## 2015-08-12 DIAGNOSIS — T82898A Other specified complication of vascular prosthetic devices, implants and grafts, initial encounter: Secondary | ICD-10-CM | POA: Diagnosis not present

## 2015-08-12 DIAGNOSIS — E119 Type 2 diabetes mellitus without complications: Secondary | ICD-10-CM | POA: Diagnosis not present

## 2015-08-12 DIAGNOSIS — Z419 Encounter for procedure for purposes other than remedying health state, unspecified: Secondary | ICD-10-CM

## 2015-08-12 DIAGNOSIS — D631 Anemia in chronic kidney disease: Secondary | ICD-10-CM | POA: Diagnosis present

## 2015-08-12 DIAGNOSIS — E669 Obesity, unspecified: Secondary | ICD-10-CM

## 2015-08-12 DIAGNOSIS — J449 Chronic obstructive pulmonary disease, unspecified: Secondary | ICD-10-CM | POA: Diagnosis present

## 2015-08-12 DIAGNOSIS — R51 Headache: Secondary | ICD-10-CM

## 2015-08-12 DIAGNOSIS — R519 Headache, unspecified: Secondary | ICD-10-CM

## 2015-08-12 LAB — LACTIC ACID, PLASMA: LACTIC ACID, VENOUS: 0.8 mmol/L (ref 0.5–2.0)

## 2015-08-12 LAB — COMPREHENSIVE METABOLIC PANEL
ALT: 19 U/L (ref 14–54)
ANION GAP: 13 (ref 5–15)
AST: 21 U/L (ref 15–41)
Albumin: 4 g/dL (ref 3.5–5.0)
Alkaline Phosphatase: 166 U/L — ABNORMAL HIGH (ref 38–126)
BUN: 28 mg/dL — ABNORMAL HIGH (ref 6–20)
CHLORIDE: 96 mmol/L — AB (ref 101–111)
CO2: 24 mmol/L (ref 22–32)
Calcium: 9.5 mg/dL (ref 8.9–10.3)
Creatinine, Ser: 6.57 mg/dL — ABNORMAL HIGH (ref 0.44–1.00)
GFR calc non Af Amer: 6 mL/min — ABNORMAL LOW (ref >60)
GFR, EST AFRICAN AMERICAN: 7 mL/min — AB (ref >60)
Glucose, Bld: 122 mg/dL — ABNORMAL HIGH (ref 65–99)
Potassium: 4.3 mmol/L (ref 3.5–5.1)
SODIUM: 133 mmol/L — AB (ref 135–145)
Total Bilirubin: 1.3 mg/dL — ABNORMAL HIGH (ref 0.3–1.2)
Total Protein: 8.5 g/dL — ABNORMAL HIGH (ref 6.5–8.1)

## 2015-08-12 LAB — CBC
HEMATOCRIT: 40.8 % (ref 36.0–46.0)
HEMOGLOBIN: 12.7 g/dL (ref 12.0–15.0)
MCH: 28.8 pg (ref 26.0–34.0)
MCHC: 31.1 g/dL (ref 30.0–36.0)
MCV: 92.5 fL (ref 78.0–100.0)
Platelets: 168 10*3/uL (ref 150–400)
RBC: 4.41 MIL/uL (ref 3.87–5.11)
RDW: 14.1 % (ref 11.5–15.5)
WBC: 6.8 10*3/uL (ref 4.0–10.5)

## 2015-08-12 LAB — ACETAMINOPHEN LEVEL: Acetaminophen (Tylenol), Serum: <10 ug/mL — ABNORMAL LOW (ref 10–30)

## 2015-08-12 LAB — PROCALCITONIN: Procalcitonin: 3.09 ng/mL

## 2015-08-12 LAB — CBG MONITORING, ED: Glucose-Capillary: 122 mg/dL — ABNORMAL HIGH (ref 65–99)

## 2015-08-12 MED ORDER — OXYCODONE HCL 5 MG PO TABS
5.0000 mg | ORAL_TABLET | ORAL | Status: DC | PRN
  Administered 2015-08-12 – 2015-08-16 (×3): 5 mg via ORAL
  Filled 2015-08-12 (×3): qty 1

## 2015-08-12 MED ORDER — SODIUM CHLORIDE 0.9 % IJ SOLN
3.0000 mL | Freq: Two times a day (BID) | INTRAMUSCULAR | Status: DC
  Administered 2015-08-15: 3 mL via INTRAVENOUS

## 2015-08-12 MED ORDER — VANCOMYCIN HCL IN DEXTROSE 1-5 GM/200ML-% IV SOLN
1000.0000 mg | INTRAVENOUS | Status: DC
  Administered 2015-08-14 – 2015-08-18 (×3): 1000 mg via INTRAVENOUS
  Filled 2015-08-12 (×6): qty 200

## 2015-08-12 MED ORDER — ONDANSETRON HCL 4 MG PO TABS: 4.0000 mg | ORAL_TABLET | Freq: Four times a day (QID) | ORAL | Status: DC | PRN

## 2015-08-12 MED ORDER — SODIUM CHLORIDE 0.9 % IV SOLN
2000.0000 mg | Freq: Once | INTRAVENOUS | Status: AC
Start: 2015-08-12 — End: 2015-08-12
  Administered 2015-08-12: 2000 mg via INTRAVENOUS
  Filled 2015-08-12: qty 2000

## 2015-08-12 MED ORDER — HEPARIN SODIUM (PORCINE) 5000 UNIT/ML IJ SOLN
5000.0000 [IU] | Freq: Three times a day (TID) | INTRAMUSCULAR | Status: DC
  Administered 2015-08-14 – 2015-08-18 (×11): 5000 [IU] via SUBCUTANEOUS
  Filled 2015-08-12 (×15): qty 1

## 2015-08-12 MED ORDER — PIPERACILLIN-TAZOBACTAM IN DEX 2-0.25 GM/50ML IV SOLN: 2.2500 g | Freq: Once | INTRAVENOUS | Status: DC

## 2015-08-12 MED ORDER — ACETAMINOPHEN 650 MG RE SUPP: 650.0000 mg | Freq: Four times a day (QID) | RECTAL | Status: DC | PRN

## 2015-08-12 MED ORDER — CLONAZEPAM 1 MG PO TABS
1.0000 mg | ORAL_TABLET | Freq: Every day | ORAL | Status: DC
  Administered 2015-08-14 – 2015-08-16 (×2): 1 mg via ORAL
  Filled 2015-08-12: qty 1
  Filled 2015-08-12: qty 2
  Filled 2015-08-12 (×2): qty 1

## 2015-08-12 MED ORDER — SODIUM CHLORIDE 0.9 % IJ SOLN
3.0000 mL | Freq: Two times a day (BID) | INTRAMUSCULAR | Status: DC
Start: 2015-08-12 — End: 2015-08-18
  Administered 2015-08-15 – 2015-08-18 (×4): 3 mL via INTRAVENOUS

## 2015-08-12 MED ORDER — INSULIN GLARGINE 100 UNIT/ML ~~LOC~~ SOLN
15.0000 [IU] | Freq: Every day | SUBCUTANEOUS | Status: DC
  Administered 2015-08-13 – 2015-08-14 (×3): 15 [IU] via SUBCUTANEOUS
  Filled 2015-08-12 (×6): qty 0.15

## 2015-08-12 MED ORDER — SODIUM CHLORIDE 0.9 % IV BOLUS (SEPSIS)
250.0000 mL | Freq: Once | INTRAVENOUS | Status: AC
  Administered 2015-08-12: 250 mL via INTRAVENOUS

## 2015-08-12 MED ORDER — SODIUM CHLORIDE 0.9 % IV SOLN
250.0000 mL | INTRAVENOUS | Status: DC | PRN
  Administered 2015-08-14: 08:00:00 via INTRAVENOUS

## 2015-08-12 MED ORDER — SODIUM CHLORIDE 0.9 % IJ SOLN: 3.0000 mL | INTRAMUSCULAR | Status: DC | PRN

## 2015-08-12 MED ORDER — CETYLPYRIDINIUM CHLORIDE 0.05 % MT LIQD
7.0000 mL | Freq: Two times a day (BID) | OROMUCOSAL | Status: DC
  Administered 2015-08-12 – 2015-08-17 (×8): 7 mL via OROMUCOSAL

## 2015-08-12 MED ORDER — SODIUM CHLORIDE 0.9 % IV SOLN
250.0000 mg | Freq: Two times a day (BID) | INTRAVENOUS | Status: DC
  Administered 2015-08-13: 250 mg via INTRAVENOUS
  Filled 2015-08-12 (×4): qty 250

## 2015-08-12 MED ORDER — SODIUM CHLORIDE 0.9 % IV SOLN
500.0000 mg | Freq: Once | INTRAVENOUS | Status: AC
  Administered 2015-08-12: 500 mg via INTRAVENOUS
  Filled 2015-08-12: qty 500

## 2015-08-12 MED ORDER — MIDODRINE HCL 5 MG PO TABS
5.0000 mg | ORAL_TABLET | Freq: Two times a day (BID) | ORAL | Status: DC
  Administered 2015-08-13 – 2015-08-15 (×3): 5 mg via ORAL
  Filled 2015-08-12 (×6): qty 1

## 2015-08-12 MED ORDER — INSULIN ASPART 100 UNIT/ML ~~LOC~~ SOLN
0.0000 [IU] | SUBCUTANEOUS | Status: DC
  Administered 2015-08-14 – 2015-08-15 (×2): 1 [IU] via SUBCUTANEOUS

## 2015-08-12 MED ORDER — HYDROMORPHONE HCL 1 MG/ML IJ SOLN: 0.5000 mg | INTRAMUSCULAR | Status: DC | PRN

## 2015-08-12 MED ORDER — ACETAMINOPHEN 325 MG PO TABS
650.0000 mg | ORAL_TABLET | Freq: Four times a day (QID) | ORAL | Status: DC | PRN
  Administered 2015-08-15: 650 mg via ORAL
  Filled 2015-08-12: qty 2

## 2015-08-12 MED ORDER — IPRATROPIUM-ALBUTEROL 0.5-2.5 (3) MG/3ML IN SOLN: 3.0000 mL | RESPIRATORY_TRACT | Status: DC | PRN

## 2015-08-12 MED ORDER — CALCIUM ACETATE (PHOS BINDER) 667 MG PO CAPS
1334.0000 mg | ORAL_CAPSULE | Freq: Three times a day (TID) | ORAL | Status: DC
  Administered 2015-08-13 – 2015-08-18 (×8): 1334 mg via ORAL
  Filled 2015-08-12 (×17): qty 2

## 2015-08-12 MED ORDER — CALCIUM CARBONATE ANTACID 500 MG PO CHEW
1000.0000 mg | CHEWABLE_TABLET | Freq: Every day | ORAL | Status: DC
  Administered 2015-08-13 – 2015-08-17 (×3): 1000 mg via ORAL
  Filled 2015-08-12 (×5): qty 2
  Filled 2015-08-12: qty 5

## 2015-08-12 MED ORDER — ONDANSETRON HCL 4 MG/2ML IJ SOLN
4.0000 mg | Freq: Four times a day (QID) | INTRAMUSCULAR | Status: DC | PRN
  Administered 2015-08-12 – 2015-08-14 (×2): 4 mg via INTRAVENOUS
  Filled 2015-08-12: qty 2

## 2015-08-12 NOTE — ED Notes (Addendum)
Pts oxygen saturation 85% while resting in bed O2 placed on 3L [place]. Oxygen Saturation 94%. MD made aware.

## 2015-08-12 NOTE — ED Provider Notes (Signed)
I saw and evaluated the patient, reviewed the resident's note and I agree with the findings and plan.   EKG Interpretation None      54 yo female with multiple medical problems presenting with fever and malaise.  On exam, ill appearing, but nontoxic, not distressed, normal respiratory effort, normal perfusion, abdomen soft without tenderness.  Source of fever unclear, but treated empirically.  Admitted by medicine.   Clinical Impression: 1. Sepsis, due to unspecified organism   2. Headache   3. Pain       Serita Grit, MD 08/14/15 [phone]

## 2015-08-12 NOTE — H&P (Signed)
[facility] Admission History and Physical       TAKERA SINGLETERRY [id number] DOB: 1961/02/27 DOA: 08/12/2015  Referring physician: EDP PCP: Barbette Merino, MD  Specialists:   Chief Complaint: Fevers and Chills  HPI: [name] is a 54 y.o. female with a history of ESRD on HD since 1992 who presents to the ED with complaints of Fever Chills and Myalgia x 2 days with headache, nausea and vomiting.   She denies any neck stiffness.   She reports occasional Cough that is nonproductive.  She reports SOB  and in the ED she was found to have hypoxia to 85% and was placed on 3 liters NCO2 and improved to 94%.   She had a temperature of 102 in the ED.    Chest Xray was negative for any acute findings, and a UA and Blood Cultures were ordered.    She was placed on IV Vancomycin and Primaxin and referred for admission.      Review of Systems:  Constitutional: No Weight Loss, No Weight Gain, Night Sweats, +Fevers, +Chills, +Myalgias, Dizziness, Light Headedness, Fatigue, or Generalized Weakness HEENT: +Headaches, Difficulty Swallowing,Tooth/Dental Problems,Sore Throat,  No Sneezing, Rhinitis, Ear Ache, Nasal Congestion, or Post Nasal Drip,  Cardio-vascular:  No Chest pain, Orthopnea, PND, Edema in Lower Extremities, Anasarca, Dizziness, Palpitations  Resp: No Dyspnea, No DOE, No Productive Cough, No Non-Productive Cough, No Hemoptysis, No Wheezing.    GI: No Heartburn, Indigestion, Abdominal Pain, +Nausea, +Vomiting, Diarrhea, Constipation, Hematemesis, Hematochezia, Melena, Change in Bowel Habits,  Loss of Appetite  GU: No Dysuria, No Change in Color of Urine, No Urgency or Urinary Frequency, No Flank pain.  Musculoskeletal: No Joint Pain or Swelling, No Decreased Range of Motion, No Back Pain.  Neurologic: No Syncope, No Seizures, Muscle Weakness, Paresthesia, Vision Disturbance or Loss, No Diplopia, No Vertigo, No Difficulty Walking,  Skin: No Rash or Lesions. Psych: No Change in Mood  or Affect, No Depression or Anxiety, No Memory loss, No Confusion, or Hallucinations   Past Medical History  Diagnosis Date  . HTN (hypertension)   . Obesities, morbid   . GERD (gastroesophageal reflux disease)   . PE (pulmonary embolism) ~ 2010  . Superior vena cava syndrome     Archie Endo 03/26/2013  . Pneumonia     "now & once a long time ago" (03/26/2013)  . Shortness of breath     "just a little bit; at any time" (03/26/2013)  . OSA on CPAP   . Iron deficiency anemia   . Chronic hypotension   . COPD (chronic obstructive pulmonary disease)   . ESRD (end stage renal disease) on dialysis     "TTS; [place]; (03/25/2014)  . Diabetes mellitus type 2 in obese     insulin dependent  . Complication of anesthesia     confusion     Past Surgical History  Procedure Laterality Date  . Vascular surgery    . Thrombectomy and revision of arterioventous (av) goretex  graft Left ~ 12/2012    "thigh"   . Arteriovenous graft placement Left ~ 2011    "thigh" (4  . Carpal tunnel release Right ~ 2011  . Carpal tunnel release Right 01/28/2015    Procedure: RIGHT CARPAL TUNNEL RELEASE;  Surgeon: Roseanne Kaufman, MD;  Location: [facility];  Service: Orthopedics;  Laterality: Right;      Prior to Admission medications   Medication Sig Start Date End Date Taking? Authorizing Provider  albuterol (PROVENTIL HFA;VENTOLIN HFA) 108 (90 BASE)  MCG/ACT inhaler Inhale 2 puffs into the lungs every 6 (six) hours as needed. For shortness of breath. Patient taking differently: Inhale 2 puffs into the lungs every 6 (six) hours as needed for wheezing or shortness of breath.  03/27/14  Yes Adeline C Viyuoh, MD  B Complex-C (B-COMPLEX WITH VITAMIN C) tablet Take 1 tablet by mouth daily.   Yes Historical Provider, MD  calcium acetate (PHOSLO) 667 MG capsule Take 1,334 mg by mouth 3 (three) times daily with meals.    Yes Historical Provider, MD  calcium elemental as carbonate (TUMS ULTRA 1000) 400 MG tablet Chew 1,000 mg by  mouth daily.   Yes Historical Provider, MD  clonazePAM (KLONOPIN) 1 MG tablet Take 1 mg by mouth at bedtime.   Yes Historical Provider, MD  insulin aspart (NOVOLOG FLEXPEN) 100 UNIT/ML injection Inject 12-15 Units into the skin 3 (three) times daily before meals. Per sliding scale   Yes Historical Provider, MD  insulin glargine (LANTUS) 100 UNIT/ML injection Inject 0.15 mLs (15 Units total) into the skin at bedtime. 03/31/13  Yes Geradine Girt, DO  metoCLOPramide (REGLAN) 5 MG tablet Take 5 mg by mouth at bedtime.     Yes Historical Provider, MD  midodrine (PROAMATINE) 5 MG tablet Take 5 mg by mouth 2 (two) times daily.     Yes Historical Provider, MD  oxyCODONE (OXY IR/ROXICODONE) 5 MG immediate release tablet Take 1 tablet (5 mg total) by mouth every 4 (four) hours as needed for severe pain. 01/28/15  Yes Roseanne Kaufman, MD  tiotropium (SPIRIVA) 18 MCG inhalation capsule Place 18 mcg into inhaler and inhale daily.   Yes Historical Provider, MD  doxycycline (VIBRAMYCIN) 100 MG capsule Take 1 capsule (100 mg total) by mouth 2 (two) times daily. Patient not taking: Reported on 01/25/2015 11/26/14   Britt Bottom, NP  methylPREDNIsolone (MEDROL DOSPACK) 4 MG tablet follow package directions Patient not taking: Reported on 08/12/2015 08/14/14   Albertine Patricia, MD     Allergies  Allergen Reactions  . Amoxicillin Anaphylaxis and Other (See Comments)    Throat closes and gets sweaty.  . Iohexol Other (See Comments)     Desc: scratchy/itchy throat/itching on back / nausea     Social History:  reports that she has never smoked. She has never used smokeless tobacco. She reports that she does not drink alcohol or use illicit drugs.    Family History  Problem Relation Age of Onset  . Diabetes Mellitus II Mother   . Hypertension Mother   . Hypertension Father   . Diabetes Mellitus II Father        Physical Exam:  GEN:  Pleasant  Obese 54 y.o. African American female examined and in no  acute distress; cooperative with exam Filed Vitals:   08/12/15 1828 08/12/15 1858 08/12/15 1900 08/12/15 2000  BP:  117/63  113/56  Pulse:  100  103  Temp: 102.7 F (39.3 C)     TempSrc: Rectal     Resp:  32  23  Weight:   122.471 kg (270 lb)   SpO2:  93%  93%   Blood pressure 113/56, pulse 103, temperature 102.7 F (39.3 C), temperature source Rectal, resp. rate 23, weight 122.471 kg (270 lb), SpO2 93 %. PSYCH: She is alert and oriented x4; does not appear anxious does not appear depressed; affect is normal HEENT: Normocephalic and Atraumatic, Mucous membranes pink; PERRLA; EOM intact; Fundi:  Benign;  No scleral icterus, Nares: Patent, Oropharynx: Clear,  Fair Dentition,    Neck:  FROM, No Cervical Lymphadenopathy nor Thyromegaly or Carotid Bruit; No JVD; Breasts:: Not examined CHEST WALL: No tenderness CHEST: Normal respiration, clear to auscultation bilaterally HEART: Regular rate and rhythm; no murmurs rubs or gallops BACK: No kyphosis or scoliosis; No CVA tenderness ABDOMEN: Positive Bowel Sounds, Obese, Soft Non-Tender, No Rebound or Guarding; No Masses, No Organomegaly Rectal Exam: Not done EXTREMITIES: No Cyanosis, Clubbing, or Edema; No Ulcerations. Genitalia: not examined PULSES: 2+ and symmetric SKIN: Normal hydration no rash or ulceration CNS:  Alert and Oriented x 4, No Focal Deficits Vascular: pulses palpable throughout    Labs on Admission:  Basic Metabolic Panel:  Recent Labs Lab 08/12/15 1748  NA 133*  K 4.3  CL 96*  CO2 24  GLUCOSE 122*  BUN 28*  CREATININE 6.57*  CALCIUM 9.5   Liver Function Tests:  Recent Labs Lab 08/12/15 1748  AST 21  ALT 19  ALKPHOS 166*  BILITOT 1.3*  PROT 8.5*  ALBUMIN 4.0   No results for input(s): LIPASE, AMYLASE in the last 168 hours. No results for input(s): AMMONIA in the last 168 hours. CBC:  Recent Labs Lab 08/12/15 1748  WBC 6.8  HGB 12.7  HCT 40.8  MCV 92.5  PLT 168   Cardiac Enzymes: No  results for input(s): CKTOTAL, CKMB, CKMBINDEX, TROPONINI in the last 168 hours.  BNP (last 3 results) No results for input(s): BNP in the last 8760 hours.  ProBNP (last 3 results) No results for input(s): PROBNP in the last 8760 hours.  CBG:  Recent Labs Lab 08/12/15 1759  GLUCAP 122*    Radiological Exams on Admission: Dg Chest Port 1 View  08/12/2015   CLINICAL DATA:  Headache.  Chills.  Nausea and vomiting for 1 day.  EXAM: PORTABLE CHEST - 1 VIEW  COMPARISON:  08/12/2014  FINDINGS: Vascular stent over the right mediastinum, likely in the SVC.  Thoracic spondylosis. Heart size within normal limits. Upper zone pulmonary vascular prominence, indeterminate due to the semi recumbent positioning. No overt edema. The lungs appear otherwise clear.  IMPRESSION: 1. No edema. Vascular stent along the right mediastinum presumably in the SVC. 2. Thoracic spondylosis.   Electronically Signed   By: Van Clines M.D.   On: 08/12/2015 18:32     EKG: Independently reviewed.     Assessment/Plan:   54 y.o. female with  Active Problems:   1.    Sepsis/SIRS (systemic inflammatory response syndrome)   IV Vancomycin and Primaxin   Sepsis Workup     2.    Acute respiratory failure with hypoxia   O2 PRN       3.    Chronic hypotension   Continue Midodrine Rx   Monitor BPs     4.    ESRD on hemodialysis- Dialysis on Tues, Thur, Sats   Notify Dialysis Team to continue Dialysis Schedule     5.    Diabetes mellitus type 2 in obese   Continue Lantus Insulin but hold if Glucose < 100   SSI coverage PRN   Check HbA1C     6.    COPD (chronic obstructive pulmonary disease)   Duonebs PRN   On Spiriva at home with Albuterol Inhaler QID PRN     7.    OSA on CPAP   CPAP qhs     8.    DVT Prophylaxis   Heparin SQ     Code Status:     FULL CODE  Family Communication:   Daughter at Bedside    Disposition Plan:    Inpatient Status        Time spent: [facility] Pager [phone]   If [place] Please Contact the Day Rounding Team MD for [facility]  If 7PM-7AM, Please Contact Night-Floor Coverage  [email or web address] Password [email or web address] 08/12/2015, 9:17 PM     ADDENDUM:   Patient was seen and examined on 08/12/2015

## 2015-08-12 NOTE — ED Notes (Signed)
Attempted to call report

## 2015-08-12 NOTE — Progress Notes (Signed)
ANTIBIOTIC CONSULT NOTE - INITIAL  Pharmacy Consult for Vancomycin and Imipenem Indication: Sepsis  Allergies  Allergen Reactions  . Amoxicillin Anaphylaxis and Other (See Comments)    Throat closes and gets sweaty.  . Iohexol Other (See Comments)     Desc: scratchy/itchy throat/itching on back / nausea     Patient Measurements: Weight: 270 lb (122.471 kg)  Vital Signs: Temp: 102.7 F (39.3 C) (09/02 1828) Temp Source: Rectal (09/02 1828) BP: 110/63 mmHg (09/02 2130) Pulse Rate: 96 (09/02 2130) Intake/Output from previous day:   Intake/Output from this shift:    Labs:  Recent Labs  08/12/15 1748  WBC 6.8  HGB 12.7  PLT 168  CREATININE 6.57*   CrCl cannot be calculated (Unknown ideal weight.). No results for input(s): VANCOTROUGH, VANCOPEAK, VANCORANDOM, GENTTROUGH, GENTPEAK, GENTRANDOM, TOBRATROUGH, TOBRAPEAK, TOBRARND, AMIKACINPEAK, AMIKACINTROU, AMIKACIN in the last 72 hours.   Microbiology: No results found for this or any previous visit (from the past 720 hour(s)).  Medical History: Past Medical History  Diagnosis Date  . HTN (hypertension)   . Obesities, morbid   . GERD (gastroesophageal reflux disease)   . PE (pulmonary embolism) ~ 2010  . Superior vena cava syndrome     Archie Endo 03/26/2013  . Pneumonia     "now & once a long time ago" (03/26/2013)  . Shortness of breath     "just a little bit; at any time" (03/26/2013)  . OSA on CPAP   . Iron deficiency anemia   . Chronic hypotension   . COPD (chronic obstructive pulmonary disease)   . ESRD (end stage renal disease) on dialysis     "TTS; [facility] [place]; (03/25/2014)  . Diabetes mellitus type 2 in obese     insulin dependent  . Complication of anesthesia     confusion    Assessment: 54 yo F presents on 9/2 with fever and chills. PMH significant for ESRD and receives HD on TTS. Pharmacy consulted to dose vancomycin and imipenem for sepsis. Received imipenem 500mg  x 1 and vancomycin 2g x 1 already.  Febrile up to 102.7, WBC wnl. Will monitor toleration of HD.  Goal of Therapy:  Vancomycin trough level 15-20 mcg/ml  Resolution of infection  Plan:  Start imipenem 250mg  IV Q12 Start vancomycin 1g IV QHD-TTS Monitor clinical picture, HD sessions, pre-HD VR at [facility] F/U C&S, abx deescalation / LOT   Vyla Pint J 08/12/2015,9:54 PM

## 2015-08-12 NOTE — ED Provider Notes (Signed)
CSN: [id number]     Arrival date & time 08/12/15  1641 History   First MD Initiated Contact with Patient 08/12/15 1722     Chief Complaint  Patient presents with  . Chills  . Emesis  . Headache   Patient is a 54 y.o. female presenting with general illness. The history is provided by the patient. No language interpreter was used.  Illness Location:  Generalized Quality:  Body aches, nausea, chills, fever, HA Severity:  Moderate Onset quality:  Gradual Timing:  Constant Progression:  Worsening Chronicity:  New Context:  PMHx of HTN, DM, COPD (no O2 at home), OSA (using CPAP at night), and ESRD (dialysis TTS) presenting with flulike illness. Patient went to dialysis appointment yesterday. Patient states that yesterday evening she experience gradual onset of rhinorrhea, chills, subjective fever, nausea, NBNB emesis, generalized headache, body aches, and decreased appetite. Patient denies otorrhea, constipation, hematochezia, melena, hematuria, dysuria, urinary frequency, urinary urgency, cough, sore throat, SOB Associated symptoms: congestion, fatigue, fever, headaches, nausea, rhinorrhea and vomiting (NBNB)   Associated symptoms: no abdominal pain, no chest pain, no cough, no diarrhea, no shortness of breath and no sore throat     Past Medical History  Diagnosis Date  . HTN (hypertension)   . Obesities, morbid   . GERD (gastroesophageal reflux disease)   . PE (pulmonary embolism) ~ 2010  . Superior vena cava syndrome     Archie Endo 03/26/2013  . Pneumonia     "now & once a long time ago" (03/26/2013)  . Shortness of breath     "just a little bit; at any time" (03/26/2013)  . OSA on CPAP   . Iron deficiency anemia   . Chronic hypotension   . COPD (chronic obstructive pulmonary disease)   . ESRD (end stage renal disease) on dialysis     "TTS; [facility] [place]; (03/25/2014)  . Diabetes mellitus type 2 in obese     insulin dependent  . Complication of anesthesia     confusion   Past Surgical  History  Procedure Laterality Date  . Vascular surgery    . Thrombectomy and revision of arterioventous (av) goretex  graft Left ~ 12/2012    "thigh"   . Arteriovenous graft placement Left ~ 2011    "thigh" (4  . Carpal tunnel release Right ~ 2011  . Carpal tunnel release Right 01/28/2015    Procedure: RIGHT CARPAL TUNNEL RELEASE;  Surgeon: Roseanne Kaufman, MD;  Location: [facility];  Service: Orthopedics;  Laterality: Right;   Family History  Problem Relation Age of Onset  . Diabetes Mellitus II Mother   . Hypertension Mother   . Hypertension Father   . Diabetes Mellitus II Father    Social History  Substance Use Topics  . Smoking status: Never Smoker   . Smokeless tobacco: Never Used  . Alcohol Use: No   OB History    No data available      Review of Systems  Constitutional: Positive for fever, chills, appetite change and fatigue.  HENT: Positive for congestion and rhinorrhea. Negative for sore throat.   Respiratory: Negative for cough and shortness of breath.   Cardiovascular: Negative for chest pain and palpitations.  Gastrointestinal: Positive for nausea and vomiting (NBNB). Negative for abdominal pain, diarrhea, constipation, blood in stool and anal bleeding.  Genitourinary: Negative for dysuria, urgency, frequency and hematuria.  Neurological: Positive for headaches.  All other systems reviewed and are negative.   Allergies  Amoxicillin and Iohexol  Home Medications  Prior to Admission medications   Medication Sig Start Date End Date Taking? Authorizing Provider  albuterol (PROVENTIL HFA;VENTOLIN HFA) 108 (90 BASE) MCG/ACT inhaler Inhale 2 puffs into the lungs every 6 (six) hours as needed. For shortness of breath. Patient taking differently: Inhale 2 puffs into the lungs every 6 (six) hours as needed for wheezing or shortness of breath.  03/27/14  Yes Adeline C Viyuoh, MD  B Complex-C (B-COMPLEX WITH VITAMIN C) tablet Take 1 tablet by mouth daily.   Yes Historical  Provider, MD  calcium acetate (PHOSLO) 667 MG capsule Take 1,334 mg by mouth 3 (three) times daily with meals.    Yes Historical Provider, MD  calcium elemental as carbonate (TUMS ULTRA 1000) 400 MG tablet Chew 1,000 mg by mouth daily.   Yes Historical Provider, MD  clonazePAM (KLONOPIN) 1 MG tablet Take 1 mg by mouth at bedtime.   Yes Historical Provider, MD  insulin aspart (NOVOLOG FLEXPEN) 100 UNIT/ML injection Inject 12-15 Units into the skin 3 (three) times daily before meals. Per sliding scale   Yes Historical Provider, MD  insulin glargine (LANTUS) 100 UNIT/ML injection Inject 0.15 mLs (15 Units total) into the skin at bedtime. 03/31/13  Yes Geradine Girt, DO  metoCLOPramide (REGLAN) 5 MG tablet Take 5 mg by mouth at bedtime.     Yes Historical Provider, MD  midodrine (PROAMATINE) 5 MG tablet Take 5 mg by mouth 2 (two) times daily.     Yes Historical Provider, MD  oxyCODONE (OXY IR/ROXICODONE) 5 MG immediate release tablet Take 1 tablet (5 mg total) by mouth every 4 (four) hours as needed for severe pain. 01/28/15  Yes Roseanne Kaufman, MD  tiotropium (SPIRIVA) 18 MCG inhalation capsule Place 18 mcg into inhaler and inhale daily.   Yes Historical Provider, MD  doxycycline (VIBRAMYCIN) 100 MG capsule Take 1 capsule (100 mg total) by mouth 2 (two) times daily. Patient not taking: Reported on 01/25/2015 11/26/14   Britt Bottom, NP  methylPREDNIsolone (MEDROL DOSPACK) 4 MG tablet follow package directions Patient not taking: Reported on 08/12/2015 08/14/14   Silver Huguenin Elgergawy, MD   BP 96/47 mmHg  Pulse 96  Temp(Src) 98.5 F (36.9 C) (Oral)  Resp 20  Ht 5\' 1"  (1.549 m)  Wt 274 lb 0.5 oz (124.3 kg)  BMI 51.80 kg/m2  SpO2 91%   Physical Exam  Constitutional: She is oriented to person, place, and time. No distress.  Obese middle-age female lying in stretcher in no acute distress.  HENT:  Head: Normocephalic and atraumatic.  Eyes: Conjunctivae are normal. Pupils are equal, round, and  reactive to light.  Neck: Normal range of motion. Neck supple.  Cardiovascular: Tachycardia present.   Pulmonary/Chest: Effort normal and breath sounds normal.  Saturations high 80s on room air - improved to 92% on 1L nasal cannula.  Abdominal: Soft. Bowel sounds are normal. There is no tenderness.  Abdomen obese so difficult to assess distention  Musculoskeletal: Normal range of motion.  Neurological: She is alert and oriented to person, place, and time.  Skin: She is not diaphoretic.  Nursing note and vitals reviewed.   ED Course  Procedures   Labs Review Labs Reviewed  COMPREHENSIVE METABOLIC PANEL - Abnormal; Notable for the following:    Sodium 133 (*)    Chloride 96 (*)    Glucose, Bld 122 (*)    BUN 28 (*)    Creatinine, Ser 6.57 (*)    Total Protein 8.5 (*)    Alkaline Phosphatase  166 (*)    Total Bilirubin 1.3 (*)    GFR calc non Af Amer 6 (*)    GFR calc Af Amer 7 (*)    All other components within normal limits  ACETAMINOPHEN LEVEL - Abnormal; Notable for the following:    Acetaminophen (Tylenol), Serum <10 (*)    All other components within normal limits  PROTIME-INR - Abnormal; Notable for the following:    Prothrombin Time 16.1 (*)    All other components within normal limits  GLUCOSE, CAPILLARY - Abnormal; Notable for the following:    Glucose-Capillary 127 (*)    All other components within normal limits  GLUCOSE, CAPILLARY - Abnormal; Notable for the following:    Glucose-Capillary 120 (*)    All other components within normal limits  CBG MONITORING, ED - Abnormal; Notable for the following:    Glucose-Capillary 122 (*)    All other components within normal limits  CULTURE, BLOOD (ROUTINE X 2)  CULTURE, BLOOD (ROUTINE X 2)  MRSA PCR SCREENING  CBC  LACTIC ACID, PLASMA  PROCALCITONIN  APTT  URINALYSIS, ROUTINE W REFLEX MICROSCOPIC (NOT AT [facility])  LACTIC ACID, PLASMA  HEMOGLOBIN [id number]  BASIC METABOLIC PANEL  CBC   Imaging Review Dg Chest Port 1  View  08/12/2015   CLINICAL DATA:  Headache.  Chills.  Nausea and vomiting for 1 day.  EXAM: PORTABLE CHEST - 1 VIEW  COMPARISON:  08/12/2014  FINDINGS: Vascular stent over the right mediastinum, likely in the SVC.  Thoracic spondylosis. Heart size within normal limits. Upper zone pulmonary vascular prominence, indeterminate due to the semi recumbent positioning. No overt edema. The lungs appear otherwise clear.  IMPRESSION: 1. No edema. Vascular stent along the right mediastinum presumably in the SVC. 2. Thoracic spondylosis.   Electronically Signed   By: Van Clines M.D.   On: 08/12/2015 18:32   I have personally reviewed and evaluated these images and lab results as part of my medical decision-making.   EKG Interpretation None      MDM  Mrs. Darnelle Maffucci is a 54 year old female with PMHx of HTN, DM, COPD (no O2 at home), OSA (using CPAP at night), and ESRD (dialysis TTS) presenting with flulike illness. Patient went to dialysis appointment yesterday. Patient states that yesterday evening she experience gradual onset of rhinorrhea, chills, subjective fever, nausea, NBNB emesis, generalized headache, body aches, and decreased appetite. Patient denies otorrhea, constipation, hematochezia, melena, hematuria, dysuria, urinary frequency, urinary urgency, cough, sore throat, shortness of breath, and chest pain. Patient denies any recent antibiotics, or travel, or recent hospitalizations.   Exam above notable for obese middle-age female lying in stretcher in no acute distress. Febrile to 100.49F. tachycardic to low 100s. Normotensive. Saturations high 80s on room air - improved to 92% on 1L nasal cannula. Abdomen obese so difficult to assess distention but nontender.  POCT glucose 122. WBC 6.8. Lactic acid 0.8. Hemoglobin 12.7. Chest x-ray showing no pulmonary edema or focal opacities to suggest pneumonia. Blood cultures drawn 2. Vancomycin and Imipenem (allergy to cillins) started in the ED.  Patient  admitted to the hospitalist service for further evaluation and management of sepsis of unknown origin. Patient understands and agrees with plan and has no further questions concerning this time.  Patient care discussed with him followed by my attending, Dr. Serita Grit.   Final diagnoses:  Sepsis, due to unspecified organism    Mayer Camel, MD 08/13/15 [id number]  Serita Grit, MD 08/14/15 2007

## 2015-08-12 NOTE — ED Notes (Signed)
Pt here for HA, chills and vomiting that started last night. sts last dialysis yesterday.

## 2015-08-13 ENCOUNTER — Encounter (HOSPITAL_COMMUNITY): Payer: Self-pay | Admitting: Nephrology

## 2015-08-13 ENCOUNTER — Inpatient Hospital Stay (HOSPITAL_COMMUNITY): Payer: Medicare Other

## 2015-08-13 DIAGNOSIS — T82898A Other specified complication of vascular prosthetic devices, implants and grafts, initial encounter: Secondary | ICD-10-CM

## 2015-08-13 LAB — CBC
HEMATOCRIT: 36 % (ref 36.0–46.0)
Hemoglobin: 11.4 g/dL — ABNORMAL LOW (ref 12.0–15.0)
MCH: 29.2 pg (ref 26.0–34.0)
MCHC: 31.7 g/dL (ref 30.0–36.0)
MCV: 92.3 fL (ref 78.0–100.0)
PLATELETS: 155 10*3/uL (ref 150–400)
RBC: 3.9 MIL/uL (ref 3.87–5.11)
RDW: 14.2 % (ref 11.5–15.5)
WBC: 6 10*3/uL (ref 4.0–10.5)

## 2015-08-13 LAB — LIPASE, BLOOD: LIPASE: 33 U/L (ref 22–51)

## 2015-08-13 LAB — BASIC METABOLIC PANEL
Anion gap: 10 (ref 5–15)
BUN: 30 mg/dL — AB (ref 6–20)
CALCIUM: 8.7 mg/dL — AB (ref 8.9–10.3)
CO2: 25 mmol/L (ref 22–32)
CREATININE: 7.4 mg/dL — AB (ref 0.44–1.00)
Chloride: 96 mmol/L — ABNORMAL LOW (ref 101–111)
GFR calc Af Amer: 6 mL/min — ABNORMAL LOW (ref >60)
GFR, EST NON AFRICAN AMERICAN: 6 mL/min — AB (ref >60)
GLUCOSE: 139 mg/dL — AB (ref 65–99)
POTASSIUM: 4.5 mmol/L (ref 3.5–5.1)
SODIUM: 131 mmol/L — AB (ref 135–145)

## 2015-08-13 LAB — GLUCOSE, CAPILLARY
GLUCOSE-CAPILLARY: 114 mg/dL — AB (ref 65–99)
GLUCOSE-CAPILLARY: 120 mg/dL — AB (ref 65–99)
GLUCOSE-CAPILLARY: 127 mg/dL — AB (ref 65–99)
GLUCOSE-CAPILLARY: 97 mg/dL (ref 65–99)
Glucose-Capillary: 134 mg/dL — ABNORMAL HIGH (ref 65–99)
Glucose-Capillary: 109 mg/dL — ABNORMAL HIGH (ref 65–99)
Glucose-Capillary: 85 mg/dL (ref 65–99)

## 2015-08-13 LAB — APTT: aPTT: 36 seconds (ref 24–37)

## 2015-08-13 LAB — LACTIC ACID, PLASMA: Lactic Acid, Venous: 0.8 mmol/L (ref 0.5–2.0)

## 2015-08-13 LAB — MRSA PCR SCREENING: MRSA BY PCR: POSITIVE — AB

## 2015-08-13 LAB — PROTIME-INR
INR: 1.28 (ref 0.00–1.49)
Prothrombin Time: 16.1 seconds — ABNORMAL HIGH (ref 11.6–15.2)

## 2015-08-13 MED ORDER — CHLORHEXIDINE GLUCONATE CLOTH 2 % EX PADS
6.0000 | MEDICATED_PAD | Freq: Every day | CUTANEOUS | Status: AC
  Administered 2015-08-13 – 2015-08-17 (×5): 6 via TOPICAL

## 2015-08-13 MED ORDER — DOXERCALCIFEROL 4 MCG/2ML IV SOLN
2.0000 ug | INTRAVENOUS | Status: DC
  Administered 2015-08-14 – 2015-08-18 (×3): 2 ug via INTRAVENOUS
  Filled 2015-08-13 (×3): qty 2

## 2015-08-13 MED ORDER — MUPIROCIN 2 % EX OINT
1.0000 "application " | TOPICAL_OINTMENT | Freq: Two times a day (BID) | CUTANEOUS | Status: AC
  Administered 2015-08-13 – 2015-08-17 (×8): 1 via NASAL
  Filled 2015-08-13 (×3): qty 22

## 2015-08-13 MED ORDER — RENA-VITE PO TABS
1.0000 | ORAL_TABLET | Freq: Every day | ORAL | Status: DC
  Administered 2015-08-16 – 2015-08-17 (×3): 1 via ORAL
  Filled 2015-08-13 (×5): qty 1

## 2015-08-13 MED ORDER — MIDODRINE HCL 5 MG PO TABS
10.0000 mg | ORAL_TABLET | Freq: Once | ORAL | Status: AC
  Administered 2015-08-13: 10 mg via ORAL

## 2015-08-13 NOTE — Progress Notes (Signed)
New Admission Note:   Arrival Method: Via stretcher from the ED Mental Orientation:  A & O x 4 Telemetry: Placed on Tele #[id number] Assessment: Completed Skin:  Skin is dry.  Stage II to left upper buttock IV:  Left AC Pain: C/O back pain  Tubes:  None Safety Measures: Safety Fall Prevention Plan has been given, discussed and signed Admission: Completed [address] [place] Orientation: Patient has been orientated to the room, unit and staff.  Family:  Daughter at bedside  Patient states she is very weak and unable to ambulate from ED stretcher to the bed.  She does have a history of MRSA; therefore, placed on Orange Contact Precautions.  Clothes at bedside.  Placed on Telemetry.  Placed on High Fall Risk.  Orders have been reviewed and implemented. Will continue to monitor the patient. Call light has been placed within reach and bed alarm has been activated.   Earleen Reaper RN- [place], [place] Phone number: [phone]

## 2015-08-13 NOTE — Progress Notes (Signed)
Placed CPAP on patient with no complications and 3L bled in. Patient is tolerating it well and RT will continue to monitor.

## 2015-08-13 NOTE — Progress Notes (Signed)
Patient Demographics  [name], is a 54 y.o. female, DOB - 12-24-1960, [id number]  Admit date - 08/12/2015   Admitting Physician Theressa Millard, MD  Outpatient Primary MD for the patient is Barbette Merino, MD  LOS - 1   Chief Complaint  Patient presents with  . Chills  . Emesis  . Headache         Subjective:   [name] today has, No headache, No chest pain, No abdominal pain - No Nausea, report generalized weakness, chills, No Cough - SOB.   Assessment & Plan    Active Problems:   ESRD on hemodialysis   Acute respiratory failure with hypoxia   Sepsis   SIRS (systemic inflammatory response syndrome)   Diabetes mellitus type 2 in obese   OSA on CPAP   Chronic hypotension   COPD (chronic obstructive pulmonary disease)  SIRS - Patient presents with fever, hypotension, tachycardia, tachypnea. - Chest x-ray negative for infiltrate, blood culture sent, does not make urine, unclear source of infection yet, does not have some drainage from her femoral graft unclear if this is the source. - Follow on blood cultures, wound cultures, vascular surgery consult regarding her graft is greatly appreciated - Continue with broad-spectrum antibiotics IV vancomycin and Primaxin pending her septic workup - Patient with some tenderness in the hypogastric area, with an abdominal x-ray, check lipase.  End-stage renal disease - Nephrology service consulted, on hemodialysis  Anemia of chronic renal disease - Stable, continue to monitor.  Chronic hypotension - Continue with midodrine  Diabetes mellitus - Continue with Lantus and insulin sliding scale - Follow on glycohemoglobin  COPD - No active wheezing, continue with nebs as needed, on Spiriva  Obstructive sleep apnea - Continue with C Pap at nighttime  Code Status: Full  Family Communication: Husband at bedside  Disposition  Plan: Pending further workup   Procedures  None   Consults   Nephrology Vascular surgery   Medications  Scheduled Meds: . antiseptic oral rinse  7 mL Mouth Rinse BID  . calcium acetate  1,334 mg Oral TID WC  . calcium carbonate  1,000 mg Oral QAC supper  . Chlorhexidine Gluconate Cloth  6 each Topical Q0600  . clonazePAM  1 mg Oral QHS  . doxercalciferol  2 mcg Intravenous Q T,Th,Sa-HD  . heparin  5,000 Units Subcutaneous 3 times per day  . imipenem-cilastatin  250 mg Intravenous Q12H  . insulin aspart  0-9 Units Subcutaneous 6 times per day  . insulin glargine  15 Units Subcutaneous QHS  . midodrine  5 mg Oral BID WC  . multivitamin  1 tablet Oral QHS  . mupirocin ointment  1 application Nasal BID  . sodium chloride  3 mL Intravenous Q12H  . sodium chloride  3 mL Intravenous Q12H  . vancomycin  1,000 mg Intravenous Q T,Th,Sa-HD   Continuous Infusions:  PRN Meds:.sodium chloride, acetaminophen **OR** acetaminophen, HYDROmorphone (DILAUDID) injection, ipratropium-albuterol, ondansetron **OR** ondansetron (ZOFRAN) IV, oxyCODONE, sodium chloride  DVT Prophylaxis   Heparin -   Lab Results  Component Value Date   PLT 155 08/13/2015    Antibiotics    Anti-infectives    Start     Dose/Rate Route Frequency Ordered Stop   08/13/15 1200  vancomycin (VANCOCIN) IVPB 1000 mg/200 mL premix     1,000 mg 200 mL/hr over 60 Minutes Intravenous Every T-Th-Sa (Hemodialysis) 08/12/15 2158     08/13/15 1000  imipenem-cilastatin (PRIMAXIN) 250 mg in sodium chloride 0.9 % 100 mL IVPB     250 mg 200 mL/hr over 30 Minutes Intravenous Every 12 hours 08/12/15 2158     08/12/15 1938  imipenem-cilastatin (PRIMAXIN) 500 mg in sodium chloride 0.9 % 100 mL IVPB     500 mg 200 mL/hr over 30 Minutes Intravenous  Once 08/12/15 1938 08/12/15 2152   08/12/15 1900  vancomycin (VANCOCIN) 2,000 mg in sodium chloride 0.9 % 500 mL IVPB     2,000 mg 250 mL/hr over 120 Minutes Intravenous  Once 08/12/15  1852 08/12/15 2146   08/12/15 1852  piperacillin-tazobactam (ZOSYN) IVPB 2.25 g  Status:  Discontinued     2.25 g 100 mL/hr over 30 Minutes Intravenous  Once 08/12/15 1852 08/12/15 2142          Objective:   Filed Vitals:   08/13/15 0154 08/13/15 0308 08/13/15 0500 08/13/15 0756  BP: 86/36 98/58 94/62  91/53  Pulse: 101 101 99 88  Temp:   98.5 F (36.9 C) 98.1 F (36.7 C)  TempSrc:   Oral Oral  Resp: 19 18 18 19   Height:      Weight:      SpO2: 74% 95% 96% 99%    Wt Readings from Last 3 Encounters:  08/12/15 124.3 kg (274 lb 0.5 oz)  01/28/15 77.111 kg (170 lb)  01/25/15 122.6 kg (270 lb 4.5 oz)     Intake/Output Summary (Last 24 hours) at 08/13/15 1313 Last data filed at 08/13/15 0600  Gross per 24 hour  Intake     80 ml  Output      0 ml  Net     80 ml     Physical Exam  Awake Alert, morbidly obese [name].AT,PERRAL Supple Neck,No JVD, No cervical lymphadenopathy appriciated.  Symmetrical Chest wall movement, Good air movement bilaterally, CTAB RRR,No Gallops,Rubs or new Murmurs, No Parasternal Heave +ve B.Sounds, Abd Soft, minimal tenderness in supraumbilical area. No Cyanosis, Clubbing or edema, left thigh graft with minimal drainage, mild erythema.   Data Review   Micro Results Recent Results (from the past 240 hour(s))  Blood culture (routine x 2)     Status: None (Preliminary result)   Collection Time: 08/12/15  5:36 PM  Result Value Ref Range Status   Specimen Description BLOOD LEFT ARM  Final   Special Requests BOTTLES DRAWN AEROBIC AND ANAEROBIC 5CC  Final   Culture NO GROWTH < 24 HOURS  Final   Report Status PENDING  Incomplete  Blood culture (routine x 2)     Status: None (Preliminary result)   Collection Time: 08/12/15 10:50 PM  Result Value Ref Range Status   Specimen Description BLOOD RIGHT ARM  Final   Special Requests BOTTLES DRAWN AEROBIC AND ANAEROBIC 6CC  Final   Culture NO GROWTH < 12 HOURS  Final   Report Status PENDING  Incomplete   MRSA PCR Screening     Status: Abnormal   Collection Time: 08/12/15 11:44 PM  Result Value Ref Range Status   MRSA by PCR POSITIVE (A) NEGATIVE Final    Comment:        The GeneXpert MRSA Assay (FDA approved for NASAL specimens only), is one component of a comprehensive MRSA colonization surveillance program. It is not intended to diagnose MRSA infection nor  to guide or monitor treatment for MRSA infections. RESULT CALLED TO, READ BACK BY AND VERIFIED WITH: K [id number]  08/13/15     Radiology Reports Dg Chest Port 1 View  08/12/2015   CLINICAL DATA:  Headache.  Chills.  Nausea and vomiting for 1 day.  EXAM: PORTABLE CHEST - 1 VIEW  COMPARISON:  08/12/2014  FINDINGS: Vascular stent over the right mediastinum, likely in the SVC.  Thoracic spondylosis. Heart size within normal limits. Upper zone pulmonary vascular prominence, indeterminate due to the semi recumbent positioning. No overt edema. The lungs appear otherwise clear.  IMPRESSION: 1. No edema. Vascular stent along the right mediastinum presumably in the SVC. 2. Thoracic spondylosis.   Electronically Signed   By: Van Clines M.D.   On: 08/12/2015 18:32     CBC  Recent Labs Lab 08/12/15 1748 08/13/15 0238  WBC 6.8 6.0  HGB 12.7 11.4*  HCT 40.8 36.0  PLT 168 155  MCV 92.5 92.3  MCH 28.8 29.2  MCHC 31.1 31.7  RDW 14.1 14.2    Chemistries   Recent Labs Lab 08/12/15 1748 08/13/15 0238  NA 133* 131*  K 4.3 4.5  CL 96* 96*  CO2 24 25  GLUCOSE 122* 139*  BUN 28* 30*  CREATININE 6.57* 7.40*  CALCIUM 9.5 8.7*  AST 21  --   ALT 19  --   ALKPHOS 166*  --   BILITOT 1.3*  --    ------------------------------------------------------------------------------------------------------------------ estimated creatinine clearance is 10.8 mL/min (by C-G formula based on Cr of 7.4). ------------------------------------------------------------------------------------------------------------------ No results for  input(s): HGBA1C in the last 72 hours. ------------------------------------------------------------------------------------------------------------------ No results for input(s): CHOL, HDL, LDLCALC, TRIG, CHOLHDL, LDLDIRECT in the last 72 hours. ------------------------------------------------------------------------------------------------------------------ No results for input(s): TSH, T4TOTAL, T3FREE, THYROIDAB in the last 72 hours.  Invalid input(s): FREET3 ------------------------------------------------------------------------------------------------------------------ No results for input(s): VITAMINB12, FOLATE, FERRITIN, TIBC, IRON, RETICCTPCT in the last 72 hours.  Coagulation profile  Recent Labs Lab 08/12/15 2250  INR 1.28    No results for input(s): DDIMER in the last 72 hours.  Cardiac Enzymes No results for input(s): CKMB, TROPONINI, MYOGLOBIN in the last 168 hours.  Invalid input(s): CK ------------------------------------------------------------------------------------------------------------------ Invalid input(s): POCBNP     Time Spent in minutes   30 minutes   Dajuana Palen M.D on 08/13/2015 at 1:13 PM  Between 7am to 7pm - Pager - [phone]  After 7pm go to [email or web address] - password [facility]  [facility]   Office  [phone]

## 2015-08-13 NOTE — Progress Notes (Signed)
Notified by NT that pt's BP was 77/44. Pt's BP manually was 80/50. MD notified and orders received and implemented. Will continue to monitor.

## 2015-08-13 NOTE — Progress Notes (Signed)
RT note: Placed patient on CPAP previous settings with 6L O2 bleed in. Vital signs stable. HR 72 Rate 18 sats 91%.

## 2015-08-13 NOTE — Progress Notes (Signed)
Patient's MRSA PCR came back positive for MRSA.  Placed on [employer] Precautions and initiated MRSA positive standing orders.  Will continue to monitor patient.  [employer] RN-BC, WTA.

## 2015-08-13 NOTE — Consult Note (Signed)
[facility] [place]  Reason for Consult:  Purulent drainage left thigh graft Referring Physician:  Dr. Joelyn Oms MRN #:  [id number]  History of Present Illness: This is a 54 y.o. female who we've been consulted on regarding purulent drainage from left thigh graft. This place was recently cannulated for dialysis on Thursday. She denies any drainage at that time.  She had her left thigh graft placed in 1997. She has undergone multiple revisions in her left thigh graft since then including removal of infected nonfunctional segments, thrombectomy and insertion of new limbs in 2004 and 2005. She previously had failed bilateral upper arm accesses, hero graft and right thigh graft. She has not had any recent surgical interventions to her left thigh graft.   She presents to [facility] ED on 08/12/15 with fever, chills and myalgia for two days. She also had some nausea. She is on HD TTS via left thigh graft. Her chest xray was negative for any acute findings. She was admitted for sepsis and started on IV vancomycin and primaxin.   She denies any shortness of breath, chest pressure, chest pain and cough. Full ROS below.  Past Medical History  Diagnosis Date  . HTN (hypertension)   . Obesities, morbid   . GERD (gastroesophageal reflux disease)   . PE (pulmonary embolism) ~ 2010  . Superior vena cava syndrome     Archie Endo 03/26/2013  . Pneumonia     "now & once a long time ago" (03/26/2013)  . Shortness of breath     "just a little bit; at any time" (03/26/2013)  . OSA on CPAP   . Iron deficiency anemia   . Chronic hypotension   . COPD (chronic obstructive pulmonary disease)   . ESRD (end stage renal disease) on dialysis     "TTS; [place]; (03/25/2014)  . Diabetes mellitus type 2 in obese     insulin dependent  . Complication of anesthesia     confusion   Past Surgical History  Procedure Laterality Date  . Vascular surgery    . Thrombectomy and revision of  arterioventous (av) goretex  graft Left ~ 12/2012    "thigh"   . Arteriovenous graft placement Left ~ 2011    "thigh" (4  . Carpal tunnel release Right ~ 2011  . Carpal tunnel release Right 01/28/2015    Procedure: RIGHT CARPAL TUNNEL RELEASE;  Surgeon: Roseanne Kaufman, MD;  Location: [place];  Service: Orthopedics;  Laterality: Right;    Allergies  Allergen Reactions  . Amoxicillin Anaphylaxis and Other (See Comments)    Throat closes and gets sweaty.  . Iohexol Other (See Comments)     Desc: scratchy/itchy throat/itching on back / nausea     Prior to Admission medications   Medication Sig Start Date End Date Taking? Authorizing Provider  albuterol (PROVENTIL HFA;VENTOLIN HFA) 108 (90 BASE) MCG/ACT inhaler Inhale 2 puffs into the lungs every 6 (six) hours as needed. For shortness of breath. Patient taking differently: Inhale 2 puffs into the lungs every 6 (six) hours as needed for wheezing or shortness of breath.  03/27/14  Yes Adeline C Viyuoh, MD  B Complex-C (B-COMPLEX WITH VITAMIN C) tablet Take 1 tablet by mouth daily.   Yes Historical Provider, MD  calcium acetate (PHOSLO) 667 MG capsule Take 1,334 mg by mouth 3 (three) times daily with meals.    Yes Historical Provider, MD  calcium elemental as carbonate (TUMS ULTRA 1000) 400 MG tablet Chew 1,000 mg  by mouth daily.   Yes Historical Provider, MD  clonazePAM (KLONOPIN) 1 MG tablet Take 1 mg by mouth at bedtime.   Yes Historical Provider, MD  insulin aspart (NOVOLOG FLEXPEN) 100 UNIT/ML injection Inject 12-15 Units into the skin 3 (three) times daily before meals. Per sliding scale   Yes Historical Provider, MD  insulin glargine (LANTUS) 100 UNIT/ML injection Inject 0.15 mLs (15 Units total) into the skin at bedtime. 03/31/13  Yes Geradine Girt, DO  metoCLOPramide (REGLAN) 5 MG tablet Take 5 mg by mouth at bedtime.     Yes Historical Provider, MD  midodrine (PROAMATINE) 5 MG tablet Take 5 mg by mouth 2 (two) times daily.     Yes  Historical Provider, MD  oxyCODONE (OXY IR/ROXICODONE) 5 MG immediate release tablet Take 1 tablet (5 mg total) by mouth every 4 (four) hours as needed for severe pain. 01/28/15  Yes Roseanne Kaufman, MD  tiotropium (SPIRIVA) 18 MCG inhalation capsule Place 18 mcg into inhaler and inhale daily.   Yes Historical Provider, MD  doxycycline (VIBRAMYCIN) 100 MG capsule Take 1 capsule (100 mg total) by mouth 2 (two) times daily. Patient not taking: Reported on 01/25/2015 11/26/14   Britt Bottom, NP  methylPREDNIsolone (MEDROL DOSPACK) 4 MG tablet follow package directions Patient not taking: Reported on 08/12/2015 08/14/14   Albertine Patricia, MD    Social History   Social History  . Marital Status: Single    Spouse Name: N/A  . Number of Children: N/A  . Years of Education: N/A   Occupational History  . Not on file.   Social History Main Topics  . Smoking status: Never Smoker   . Smokeless tobacco: Never Used  . Alcohol Use: No  . Drug Use: No  . Sexual Activity: No   Other Topics Concern  . Not on file   Social History Narrative     Family History  Problem Relation Age of Onset  . Diabetes Mellitus II Mother   . Hypertension Mother   . Hypertension Father   . Diabetes Mellitus II Father     ROS: [x]  Positive   [ ]  Negative   [ ]  All sytems reviewed and are negative  Cardiovascular: []  chest pain/pressure []  palpitations []  SOB lying flat []  DOE []  pain in legs while walking []  pain in legs at rest []  pain in legs at night []  non-healing ulcers []  hx of DVT []  swelling in legs  Pulmonary: []  productive cough []  asthma/wheezing []  home O2  Neurologic: []  weakness in []  arms []  legs []  numbness in []  arms []  legs []  hx of CVA []  mini stroke [] difficulty speaking or slurred speech []  temporary loss of vision in one eye []  dizziness  Hematologic: []  hx of cancer []  bleeding problems []  problems with blood clotting easily  Endocrine:   [x]  diabetes []   thyroid disease  GI []  vomiting blood []  blood in stool  GU: []  CKD/renal failure []  HD--[]  M/W/F or []  T/T/S []  burning with urination []  blood in urine  Psychiatric: []  anxiety []  depression  Musculoskeletal: []  arthritis []  joint pain  Integumentary: []  rashes []  ulcers  Constitutional: [x]  fever [x]  chills   Physical Examination  Filed Vitals:   08/13/15 0756  BP: 91/53  Pulse: 88  Temp: 98.1 F (36.7 C)  Resp: 19   Body mass index is 51.8 kg/(m^2).  General:  Ill appearing, morbidly obese, intermittently falling asleep, female in NAD Gait: Not observed HENT: WNL,  normocephalic Pulmonary: normal non-labored breathing, without Rales, rhonchi,  wheezing Cardiac: regular, without  Murmurs, rubs or gallops; without carotid bruits Abdomen: soft, NT/ND, no masses Vascular Exam/Pulses: left thigh graft palpable thrill and audible bruit. 4 mm wound to lateral thigh graft (4 o'clock position) with mild sanguinous/purulent drainage expressed. No visible graft. No surrounding erythema. No fluctuance. Palpable pedal pulses bilaterally.  Extremities: without ischemic changes, without Gangrene , without cellulitis; without open wounds;  Musculoskeletal: no muscle wasting or atrophy  Neurologic: No focal deficits.   CBC    Component Value Date/Time   WBC 6.0 08/13/2015 0238   RBC 3.90 08/13/2015 0238   RBC 3.65* 03/29/2009 1712   HGB 11.4* 08/13/2015 0238   HCT 36.0 08/13/2015 0238   PLT 155 08/13/2015 0238   MCV 92.3 08/13/2015 0238   MCH 29.2 08/13/2015 0238   MCHC 31.7 08/13/2015 0238   RDW 14.2 08/13/2015 0238   LYMPHSABS 0.9 03/25/2014 1159   MONOABS 0.5 03/25/2014 1159   EOSABS 0.2 03/25/2014 1159   BASOSABS 0.0 03/25/2014 1159    BMET    Component Value Date/Time   NA 131* 08/13/2015 0238   K 4.5 08/13/2015 0238   CL 96* 08/13/2015 0238   CO2 25 08/13/2015 0238   GLUCOSE 139* 08/13/2015 0238   BUN 30* 08/13/2015 0238   CREATININE 7.40*  08/13/2015 0238   CALCIUM 8.7* 08/13/2015 0238   GFRNONAA 6* 08/13/2015 0238   GFRAA 6* 08/13/2015 0238    COAGS: Lab Results  Component Value Date   INR 1.28 08/12/2015   INR 1.10 03/26/2013   INR 1.05 05/30/2011    ASSESSMENT: This is a 54 y.o. female with sepsis of unknown etiology. Possible infected left AV thigh graft.   PLAN: Wound culture obtained and blood cultures pending. Some purulence expressed initially from thigh graft but was overall sanguinous. Unsure that this is the cause of her sepsis. Wound seems disproportionate to her acute illness. Ok for dialysis today and cannulation away from wound. No indication for urgent surgical intervention. Dr. Scot Dock to evaluate patient and make recommendations.    Virgina Jock, PA-C [facility] Office: [phone] Pager: [phone]  Agree with above.  Would go ahead and do dialysis today. I will make her NPO after midnight and re-evaluate in AM. Currently, I think that the exam is not impressive.   Deitra Mayo, MD, [place] [phone] Office: [phone]

## 2015-08-13 NOTE — Consult Note (Signed)
[facility] Ren[facility] Consultation Note    Indication for Consultation:  Management of ESRD/hemodialysis; anemia, hypertension/volume and secondary hyperparathyroidism  HPI: [name] is a 54 y.o. female with ESRD secondary to HTN on HD since 1995, DM, morbid obesity, hx PEs on daily lovenox, multiple vascular access issues, OSA, presented to the ED with fever and chills, aches, headache, N and vomiting that started yesterday.  She was SOB in the ED with sats of 85% with temp up to 102.7. CXR was neg for infiltrate or volume. WBC 6.8 no diff procalcitonin 3.09 lactic acid 0.8.  She reports drainage from scab on lateral left thigh graft that is slightly tender.  She has had no recent work on her thigh graft. She is due for HD today  Past Medical History  Diagnosis Date  . HTN (hypertension)   . Obesities, morbid   . GERD (gastroesophageal reflux disease)   . PE (pulmonary embolism) ~ 2010  . Superior vena cava syndrome     Archie Endo 03/26/2013  . Pneumonia     "now & once a long time ago" (03/26/2013)  . Shortness of breath     "just a little bit; at any time" (03/26/2013)  . OSA on CPAP   . Iron deficiency anemia   . Chronic hypotension   . COPD (chronic obstructive pulmonary disease)   . ESRD (end stage renal disease) on dialysis     "TTS; [place]; (03/25/2014)  . Diabetes mellitus type 2 in obese     insulin dependent  . Complication of anesthesia     confusion   Past Surgical History  Procedure Laterality Date  . Vascular surgery    . Thrombectomy and revision of arterioventous (av) goretex  graft Left ~ 12/2012    "thigh"   . Arteriovenous graft placement Left ~ 2011    "thigh" (4  . Carpal tunnel release Right ~ 2011  . Carpal tunnel release Right 01/28/2015    Procedure: RIGHT CARPAL TUNNEL RELEASE;  Surgeon: Roseanne Kaufman, MD;  Location: [place];  Service: Orthopedics;  Laterality: Right;   Family History  Problem Relation Age of Onset  . Diabetes Mellitus  II Mother   . Hypertension Mother   . Hypertension Father   . Diabetes Mellitus II Father    Social History:  reports that she has never smoked. She has never used smokeless tobacco. She reports that she does not drink alcohol or use illicit drugs. Allergies  Allergen Reactions  . Amoxicillin Anaphylaxis and Other (See Comments)    Throat closes and gets sweaty.  . Iohexol Other (See Comments)     Desc: scratchy/itchy throat/itching on back / nausea    Prior to Admission medications   Medication Sig Start Date End Date Taking? Authorizing Provider  albuterol (PROVENTIL HFA;VENTOLIN HFA) 108 (90 BASE) MCG/ACT inhaler Inhale 2 puffs into the lungs every 6 (six) hours as needed. For shortness of breath. Patient taking differently: Inhale 2 puffs into the lungs every 6 (six) hours as needed for wheezing or shortness of breath.  03/27/14  Yes Adeline C Viyuoh, MD  B Complex-C (B-COMPLEX WITH VITAMIN C) tablet Take 1 tablet by mouth daily.   Yes Historical Provider, MD  calcium acetate (PHOSLO) 667 MG capsule Take 1,334 mg by mouth 3 (three) times daily with meals.    Yes Historical Provider, MD  calcium elemental as carbonate (TUMS ULTRA 1000) 400 MG tablet Chew 1,000 mg by mouth daily.   Yes Historical Provider,  MD  clonazePAM (KLONOPIN) 1 MG tablet Take 1 mg by mouth at bedtime.   Yes Historical Provider, MD  insulin aspart (NOVOLOG FLEXPEN) 100 UNIT/ML injection Inject 12-15 Units into the skin 3 (three) times daily before meals. Per sliding scale   Yes Historical Provider, MD  insulin glargine (LANTUS) 100 UNIT/ML injection Inject 0.15 mLs (15 Units total) into the skin at bedtime. 03/31/13  Yes Geradine Girt, DO  metoCLOPramide (REGLAN) 5 MG tablet Take 5 mg by mouth at bedtime.     Yes Historical Provider, MD  midodrine (PROAMATINE) 5 MG tablet Take 5 mg by mouth 2 (two) times daily.     Yes Historical Provider, MD  oxyCODONE (OXY IR/ROXICODONE) 5 MG immediate release tablet Take 1 tablet  (5 mg total) by mouth every 4 (four) hours as needed for severe pain. 01/28/15  Yes Roseanne Kaufman, MD  tiotropium (SPIRIVA) 18 MCG inhalation capsule Place 18 mcg into inhaler and inhale daily.   Yes Historical Provider, MD  doxycycline (VIBRAMYCIN) 100 MG capsule Take 1 capsule (100 mg total) by mouth 2 (two) times daily. Patient not taking: Reported on 01/25/2015 11/26/14   Britt Bottom, NP  methylPREDNIsolone (MEDROL DOSPACK) 4 MG tablet follow package directions Patient not taking: Reported on 08/12/2015 08/14/14   Albertine Patricia, MD   Current Facility-Administered Medications  Medication Dose Route Frequency Provider Last Rate Last Dose  . 0.9 %  sodium chloride infusion  250 mL Intravenous PRN Theressa Millard, MD      . acetaminophen (TYLENOL) tablet 650 mg  650 mg Oral Q6H PRN Theressa Millard, MD       Or  . acetaminophen (TYLENOL) suppository 650 mg  650 mg Rectal Q6H PRN Theressa Millard, MD      . antiseptic oral rinse (CPC / CETYLPYRIDINIUM CHLORIDE 0.05%) solution 7 mL  7 mL Mouth Rinse BID Theressa Millard, MD   7 mL at 08/13/15 1000  . calcium acetate (PHOSLO) capsule 1,334 mg  1,334 mg Oral TID WC Theressa Millard, MD   1,334 mg at 08/13/15 0800  . calcium carbonate (TUMS - dosed in mg elemental calcium) chewable tablet 1,000 mg  1,000 mg Oral QAC supper Theressa Millard, MD      . Chlorhexidine Gluconate Cloth 2 % PADS 6 each  6 each Topical Q0600 Theressa Millard, MD   6 each at 08/13/15 [phone]  . clonazePAM (KLONOPIN) tablet 1 mg  1 mg Oral QHS Theressa Millard, MD   1 mg at 08/12/15 2328  . heparin injection 5,000 Units  5,000 Units Subcutaneous 3 times per day Theressa Millard, MD      . HYDROmorphone (DILAUDID) injection 0.5-1 mg  0.5-1 mg Intravenous Q3H PRN Theressa Millard, MD      . imipenem-cilastatin (PRIMAXIN) 250 mg in sodium chloride 0.9 % 100 mL IVPB  250 mg Intravenous Q12H Reginia Naas, RPH      . insulin aspart (novoLOG) injection  0-9 Units  0-9 Units Subcutaneous 6 times per day Theressa Millard, MD   0 Units at 08/12/15 2200  . insulin glargine (LANTUS) injection 15 Units  15 Units Subcutaneous QHS Theressa Millard, MD   15 Units at 08/13/15 0107  . ipratropium-albuterol (DUONEB) 0.5-2.5 (3) MG/3ML nebulizer solution 3 mL  3 mL Nebulization Q4H PRN Harvette Evonnie Dawes, MD      . midodrine (PROAMATINE) tablet 5 mg  5 mg Oral BID WC Harvette  Evonnie Dawes, MD   5 mg at 08/13/15 0829  . mupirocin ointment (BACTROBAN) 2 % 1 application  1 application Nasal BID Theressa Millard, MD      . ondansetron [facility]) tablet 4 mg  4 mg Oral Q6H PRN Theressa Millard, MD       Or  . ondansetron (ZOFRAN) injection 4 mg  4 mg Intravenous Q6H PRN Theressa Millard, MD   4 mg at 08/12/15 2327  . oxyCODONE (Oxy IR/ROXICODONE) immediate release tablet 5 mg  5 mg Oral Q4H PRN Theressa Millard, MD   5 mg at 08/12/15 2327  . sodium chloride 0.9 % injection 3 mL  3 mL Intravenous Q12H Theressa Millard, MD   0 mL at 08/12/15 2329  . sodium chloride 0.9 % injection 3 mL  3 mL Intravenous Q12H Theressa Millard, MD   3 mL at 08/12/15 2328  . sodium chloride 0.9 % injection 3 mL  3 mL Intravenous PRN Theressa Millard, MD      . vancomycin (VANCOCIN) IVPB 1000 mg/200 mL premix  1,000 mg Intravenous Q T,Th,Sa-HD Reginia Naas, [facility]       Labs: Basic Metabolic Panel:  Recent Labs Lab 08/12/15 1748 08/13/15 0238  NA 133* 131*  K 4.3 4.5  CL 96* 96*  CO2 24 25  GLUCOSE 122* 139*  BUN 28* 30*  CREATININE 6.57* 7.40*  CALCIUM 9.5 8.7*   Liver Function Tests:  Recent Labs Lab 08/12/15 1748  AST 21  ALT 19  ALKPHOS 166*  BILITOT 1.3*  PROT 8.5*  ALBUMIN 4.0  CBC:  Recent Labs Lab 08/12/15 1748 08/13/15 0238  WBC 6.8 6.0  HGB 12.7 11.4*  HCT 40.8 36.0  MCV 92.5 92.3  PLT 168 155  CBG:  Recent Labs Lab 08/12/15 1759 08/12/15 2202 08/13/15 0008 08/13/15 0406 08/13/15 0755  GLUCAP 122* 127* 120* 134* 109*    Studies/Results: Dg Chest Port 1 View  08/12/2015   CLINICAL DATA:  Headache.  Chills.  Nausea and vomiting for 1 day.  EXAM: PORTABLE CHEST - 1 VIEW  COMPARISON:  08/12/2014  FINDINGS: Vascular stent over the right mediastinum, likely in the SVC.  Thoracic spondylosis. Heart size within normal limits. Upper zone pulmonary vascular prominence, indeterminate due to the semi recumbent positioning. No overt edema. The lungs appear otherwise clear.  IMPRESSION: 1. No edema. Vascular stent along the right mediastinum presumably in the SVC. 2. Thoracic spondylosis.   Electronically Signed   By: Van Clines M.D.   On: 08/12/2015 18:32    ROS: As per HPI otherwise negative.  Physical Exam: Filed Vitals:   08/13/15 0154 08/13/15 0308 08/13/15 0500 08/13/15 0756  BP: 86/36 98/58 94/62  91/53  Pulse: 101 101 99 88  Temp:   98.5 F (36.9 C) 98.1 F (36.7 C)  TempSrc:   Oral Oral  Resp: 19 18 18 19   Height:      Weight:      SpO2: 74% 95% 96% 99%     General: Morbidly obese, ill appearing Head: Normocephalic, atraumatic, sclera non-icteric, mucus membranes are moist Neck: Supple.  Lungs: no rales Breathing is unlabored. Heart: RRR with S1 S2. No murmurs, rubs, or gallops appreciated. Abdomen: Soft, obese NT Lower extremities:without edema or ischemic changes, no open wounds  Neuro: Alert and oriented X 3. Moves all extremities spontaneously. Psych:  Responds to questions appropriately with a normal affect. Dialysis Access: left  Thigh graft with purulent, bloody  drainage from scabbed area on lateral thigh, min erythema, mild tenderness  Dialysis Orders:   TTS [facility] 4.45 hr 2 K 2.5 Ca 180 EDW 122.5 no profile 36 degrees thigh graft heparin 3000 with 1000 mid tmt, Hectorol 2 no ESA or Fe Recent labs;  Hgb 11.7 Ca 10 (usually less) P ok iPTH 201  Assessment/Plan: 1. Febrile illness/ sepsis, cultures pending, CXR neg, does not make urine - most likely etiology is thigh graft - VVS  consulted to evaluate - MRSA + PCR, have ordered culture of graft drainage, but already started on Vanc and Primaxin. Wound seems disproportionate to acute illness. Follow cultures and see if she spikes a temp on HD today. 2. ESRD -  TTS - stable outpt HD today per routine orders - shorten HD today to 4.25 hours - cannulate away from drainage on graft - hold heparin prn 3. Chronic hypotension/volume  - chronic low BP on HD -gains 3-4 kg - CXR clear; keep BP > 85, midodrine for BP support 4. Anemia  - Hgb 11-12 stable no ESA or Fe since last November 5. Metabolic bone disease -  Continue hectorol,  iPTH controlled 6. Nutrition - CL advance as tolerate - vitamin 7. Hx PE - on daily lovenox, changed to heparin here 8. DM - pr primary  Myriam Jacobson, PA-C [place] [phone] 08/13/2015, 9:50 AM

## 2015-08-14 ENCOUNTER — Encounter (HOSPITAL_COMMUNITY)
Admission: EM | Disposition: A | Discharge status: Home Health | Payer: Self-pay | Source: Home / Self Care | Attending: Internal Medicine

## 2015-08-14 ENCOUNTER — Encounter (HOSPITAL_COMMUNITY): Payer: Self-pay | Admitting: Anesthesiology

## 2015-08-14 ENCOUNTER — Inpatient Hospital Stay (HOSPITAL_COMMUNITY): Payer: Medicare Other | Admitting: Anesthesiology

## 2015-08-14 DIAGNOSIS — L8990 Pressure ulcer of unspecified site, unspecified stage: Secondary | ICD-10-CM | POA: Insufficient documentation

## 2015-08-14 HISTORY — PX: REVISION OF ARTERIOVENOUS GORETEX GRAFT: SHX6073

## 2015-08-14 LAB — GLUCOSE, CAPILLARY
GLUCOSE-CAPILLARY: 80 mg/dL (ref 65–99)
GLUCOSE-CAPILLARY: 96 mg/dL (ref 65–99)
GLUCOSE-CAPILLARY: 129 mg/dL — AB (ref 65–99)
Glucose-Capillary: 93 mg/dL (ref 65–99)
Glucose-Capillary: 98 mg/dL (ref 65–99)
Glucose-Capillary: 84 mg/dL (ref 65–99)
Glucose-Capillary: 94 mg/dL (ref 65–99)

## 2015-08-14 SURGERY — REVISION OF ARTERIOVENOUS GORETEX GRAFT
Anesthesia: General | Site: Thigh | Laterality: Left

## 2015-08-14 MED ORDER — ALBUMIN HUMAN 5 % IV SOLN
12.5000 g | Freq: Once | INTRAVENOUS | Status: AC
  Administered 2015-08-14: 12.5 g via INTRAVENOUS

## 2015-08-14 MED ORDER — LIDOCAINE HCL (CARDIAC) 20 MG/ML IV SOLN
INTRAVENOUS | Status: DC | PRN
  Administered 2015-08-14: 20 mg via INTRAVENOUS
  Administered 2015-08-14: 80 mg via INTRAVENOUS

## 2015-08-14 MED ORDER — THROMBIN 20000 UNITS EX SOLR
CUTANEOUS | Status: AC
  Filled 2015-08-14: qty 20000

## 2015-08-14 MED ORDER — PROPOFOL 10 MG/ML IV BOLUS
INTRAVENOUS | Status: AC
  Filled 2015-08-14: qty 20

## 2015-08-14 MED ORDER — EPHEDRINE SULFATE 50 MG/ML IJ SOLN
INTRAMUSCULAR | Status: AC
  Filled 2015-08-14: qty 1

## 2015-08-14 MED ORDER — NEOSTIGMINE METHYLSULFATE 10 MG/10ML IV SOLN
INTRAVENOUS | Status: AC
  Filled 2015-08-14: qty 1

## 2015-08-14 MED ORDER — DOXERCALCIFEROL 4 MCG/2ML IV SOLN
INTRAVENOUS | Status: AC
  Filled 2015-08-14: qty 2

## 2015-08-14 MED ORDER — HEPARIN SODIUM (PORCINE) 1000 UNIT/ML IJ SOLN
INTRAMUSCULAR | Status: AC
  Filled 2015-08-14: qty 1

## 2015-08-14 MED ORDER — ONDANSETRON HCL 4 MG/2ML IJ SOLN
INTRAMUSCULAR | Status: AC
  Filled 2015-08-14: qty 2

## 2015-08-14 MED ORDER — FENTANYL CITRATE (PF) 250 MCG/5ML IJ SOLN
INTRAMUSCULAR | Status: AC
  Filled 2015-08-14: qty 5

## 2015-08-14 MED ORDER — HEPARIN SODIUM (PORCINE) 1000 UNIT/ML IJ SOLN
INTRAMUSCULAR | Status: DC | PRN
  Administered 2015-08-14: 9000 [IU] via INTRAVENOUS

## 2015-08-14 MED ORDER — PHENYLEPHRINE HCL 10 MG/ML IJ SOLN
10.0000 mg | INTRAVENOUS | Status: DC | PRN
  Administered 2015-08-14: 20 ug/min via INTRAVENOUS

## 2015-08-14 MED ORDER — MIDODRINE HCL 5 MG PO TABS
ORAL_TABLET | ORAL | Status: AC
  Filled 2015-08-14: qty 1

## 2015-08-14 MED ORDER — SODIUM CHLORIDE 0.9 % IJ SOLN
INTRAMUSCULAR | Status: AC
  Filled 2015-08-14: qty 10

## 2015-08-14 MED ORDER — NEOSTIGMINE METHYLSULFATE 10 MG/10ML IV SOLN
INTRAVENOUS | Status: DC | PRN
  Administered 2015-08-14: 3 mg via INTRAVENOUS

## 2015-08-14 MED ORDER — ROCURONIUM BROMIDE 100 MG/10ML IV SOLN
INTRAVENOUS | Status: DC | PRN
  Administered 2015-08-14: 20 mg via INTRAVENOUS

## 2015-08-14 MED ORDER — LIDOCAINE HCL (CARDIAC) 20 MG/ML IV SOLN
INTRAVENOUS | Status: AC
  Filled 2015-08-14: qty 5

## 2015-08-14 MED ORDER — PROPOFOL 10 MG/ML IV BOLUS
INTRAVENOUS | Status: DC | PRN
  Administered 2015-08-14: 100 mg via INTRAVENOUS
  Administered 2015-08-14: 30 mg via INTRAVENOUS

## 2015-08-14 MED ORDER — PROTAMINE SULFATE 10 MG/ML IV SOLN
INTRAVENOUS | Status: AC
  Filled 2015-08-14: qty 5

## 2015-08-14 MED ORDER — SODIUM CHLORIDE 0.9 % IR SOLN
Status: DC | PRN
  Administered 2015-08-14: 500 mL

## 2015-08-14 MED ORDER — SUCCINYLCHOLINE CHLORIDE 20 MG/ML IJ SOLN
INTRAMUSCULAR | Status: AC
  Filled 2015-08-14: qty 1

## 2015-08-14 MED ORDER — MIDAZOLAM HCL 2 MG/2ML IJ SOLN
INTRAMUSCULAR | Status: AC
  Filled 2015-08-14: qty 4

## 2015-08-14 MED ORDER — ROCURONIUM BROMIDE 50 MG/5ML IV SOLN
INTRAVENOUS | Status: AC
  Filled 2015-08-14: qty 1

## 2015-08-14 MED ORDER — GLYCOPYRROLATE 0.2 MG/ML IJ SOLN
INTRAMUSCULAR | Status: DC | PRN
  Administered 2015-08-14: .4 mg via INTRAVENOUS

## 2015-08-14 MED ORDER — PROTAMINE SULFATE 10 MG/ML IV SOLN
INTRAVENOUS | Status: DC | PRN
  Administered 2015-08-14: 10 mg via INTRAVENOUS
  Administered 2015-08-14: 40 mg via INTRAVENOUS

## 2015-08-14 MED ORDER — SUCCINYLCHOLINE CHLORIDE 20 MG/ML IJ SOLN
INTRAMUSCULAR | Status: DC | PRN
  Administered 2015-08-14: 120 mg via INTRAVENOUS

## 2015-08-14 MED ORDER — HYDROMORPHONE HCL 1 MG/ML IJ SOLN: 0.2500 mg | INTRAMUSCULAR | Status: DC | PRN

## 2015-08-14 MED ORDER — PHENYLEPHRINE HCL 10 MG/ML IJ SOLN
INTRAMUSCULAR | Status: DC | PRN
  Administered 2015-08-14 (×7): 80 ug via INTRAVENOUS

## 2015-08-14 MED ORDER — 0.9 % SODIUM CHLORIDE (POUR BTL) OPTIME
TOPICAL | Status: DC | PRN
  Administered 2015-08-14: 1000 mL

## 2015-08-14 MED ORDER — PHENYLEPHRINE HCL 10 MG/ML IJ SOLN
0.0000 ug/min | INTRAVENOUS | Status: DC
  Administered 2015-08-14: 20 ug/min via INTRAVENOUS
  Administered 2015-08-14 (×2): 100 ug/min via INTRAVENOUS
  Administered 2015-08-15: 80 ug/min via INTRAVENOUS
  Administered 2015-08-15: 100 ug/min via INTRAVENOUS
  Administered 2015-08-15: 45 ug/min via INTRAVENOUS
  Administered 2015-08-15: 50 ug/min via INTRAVENOUS
  Administered 2015-08-15: 80 ug/min via INTRAVENOUS
  Filled 2015-08-14 (×10): qty 1

## 2015-08-14 MED ORDER — ALBUMIN HUMAN 5 % IV SOLN
12.5000 g | Freq: Once | INTRAVENOUS | Status: AC
  Administered 2015-08-14: 12.5 g via INTRAVENOUS
  Filled 2015-08-14: qty 250

## 2015-08-14 MED ORDER — ALBUMIN HUMAN 5 % IV SOLN
INTRAVENOUS | Status: DC | PRN
  Administered 2015-08-14: 09:00:00 via INTRAVENOUS

## 2015-08-14 MED ORDER — PROMETHAZINE HCL 25 MG/ML IJ SOLN: 6.2500 mg | INTRAMUSCULAR | Status: DC | PRN

## 2015-08-14 MED ORDER — LIDOCAINE HCL (PF) 1 % IJ SOLN
INTRAMUSCULAR | Status: AC
Start: 2015-08-14 — End: 2015-08-14
  Filled 2015-08-14: qty 30

## 2015-08-14 MED ORDER — FENTANYL CITRATE (PF) 100 MCG/2ML IJ SOLN
INTRAMUSCULAR | Status: DC | PRN
  Administered 2015-08-14: 50 ug via INTRAVENOUS

## 2015-08-14 MED ORDER — PHENYLEPHRINE HCL 10 MG/ML IJ SOLN
INTRAMUSCULAR | Status: AC
  Filled 2015-08-14: qty 1

## 2015-08-14 MED ORDER — LIDOCAINE-EPINEPHRINE (PF) 1 %-1:200000 IJ SOLN
INTRAMUSCULAR | Status: AC
  Filled 2015-08-14: qty 30

## 2015-08-14 MED ORDER — DIPHENHYDRAMINE HCL 50 MG/ML IJ SOLN
INTRAMUSCULAR | Status: AC
  Filled 2015-08-14: qty 1

## 2015-08-14 MED ORDER — EPHEDRINE SULFATE 50 MG/ML IJ SOLN
INTRAMUSCULAR | Status: DC | PRN
  Administered 2015-08-14: 10 mg via INTRAVENOUS

## 2015-08-14 MED ORDER — GLYCOPYRROLATE 0.2 MG/ML IJ SOLN
INTRAMUSCULAR | Status: AC
  Filled 2015-08-14: qty 3

## 2015-08-14 SURGICAL SUPPLY — 46 items
CANISTER SUCTION 2500CC (MISCELLANEOUS) ×3 IMPLANT
CANNULA VESSEL 3MM 2 BLNT TIP (CANNULA) IMPLANT
CATH EMB 4FR 80CM (CATHETERS) ×2 IMPLANT
CLIP TI MEDIUM 6 (CLIP) ×3 IMPLANT
CLIP TI WIDE RED SMALL 6 (CLIP) ×3 IMPLANT
CONT SPEC 4OZ CLIKSEAL STRL BL (MISCELLANEOUS) ×2 IMPLANT
DECANTER SPIKE VIAL GLASS SM (MISCELLANEOUS) ×3 IMPLANT
DRSG TEGADERM 4X4.75 (GAUZE/BANDAGES/DRESSINGS) ×4 IMPLANT
ELECT REM PT RETURN 9FT ADLT (ELECTROSURGICAL) ×3
ELECTRODE REM PT RTRN 9FT ADLT (ELECTROSURGICAL) ×1 IMPLANT
GAUZE SPONGE 2X2 8PLY STRL LF (GAUZE/BANDAGES/DRESSINGS) IMPLANT
GAUZE SPONGE 4X4 12PLY STRL (GAUZE/BANDAGES/DRESSINGS) ×2 IMPLANT
GLOVE BIO SURGEON STRL SZ 6.5 (GLOVE) ×1 IMPLANT
GLOVE BIO SURGEON STRL SZ7 (GLOVE) ×2 IMPLANT
GLOVE BIO SURGEON STRL SZ7.5 (GLOVE) ×3 IMPLANT
GLOVE BIO SURGEONS STRL SZ 6.5 (GLOVE) ×1
GLOVE BIOGEL PI IND STRL 6.5 (GLOVE) IMPLANT
GLOVE BIOGEL PI IND STRL 8 (GLOVE) ×1 IMPLANT
GLOVE BIOGEL PI INDICATOR 6.5 (GLOVE) ×6
GLOVE BIOGEL PI INDICATOR 8 (GLOVE) ×2
GLOVE ECLIPSE 6.5 STRL STRAW (GLOVE) ×2 IMPLANT
GOWN STRL REUS W/ TWL LRG LVL3 (GOWN DISPOSABLE) ×3 IMPLANT
GOWN STRL REUS W/TWL LRG LVL3 (GOWN DISPOSABLE) ×9
GRAFT GORETEX STRT 4-7X45 (Vascular Products) ×2 IMPLANT
KIT BASIN OR (CUSTOM PROCEDURE TRAY) ×3 IMPLANT
KIT ROOM TURNOVER OR (KITS) ×3 IMPLANT
LIQUID BAND (GAUZE/BANDAGES/DRESSINGS) ×5 IMPLANT
NDL HYPO 25GX1X1/2 BEV (NEEDLE) ×1 IMPLANT
NEEDLE HYPO 25GX1X1/2 BEV (NEEDLE) ×3 IMPLANT
NS IRRIG 1000ML POUR BTL (IV SOLUTION) ×3 IMPLANT
PACK CV ACCESS (CUSTOM PROCEDURE TRAY) ×3 IMPLANT
PAD ARMBOARD 7.5X6 YLW CONV (MISCELLANEOUS) ×6 IMPLANT
SPONGE GAUZE 2X2 STER 10/PKG (GAUZE/BANDAGES/DRESSINGS) ×2
SPONGE LAP 18X18 X RAY DECT (DISPOSABLE) ×2 IMPLANT
SPONGE SURGIFOAM ABS GEL 100 (HEMOSTASIS) IMPLANT
SUT PROLENE 5 0 C 1 24 (SUTURE) ×2 IMPLANT
SUT PROLENE 6 0 BV (SUTURE) ×8 IMPLANT
SUT VIC AB 2-0 CT1 27 (SUTURE) ×3
SUT VIC AB 2-0 CT1 TAPERPNT 27 (SUTURE) IMPLANT
SUT VIC AB 3-0 SH 27 (SUTURE) ×9
SUT VIC AB 3-0 SH 27X BRD (SUTURE) ×2 IMPLANT
SUT VICRYL 4-0 PS2 18IN ABS (SUTURE) ×10 IMPLANT
TAPE CLOTH SURG 6X10 WHT LF (GAUZE/BANDAGES/DRESSINGS) ×2 IMPLANT
TOWEL OR 17X24 6PK STRL BLUE (TOWEL DISPOSABLE) ×2 IMPLANT
UNDERPAD 30X30 INCONTINENT (UNDERPADS AND DIAPERS) ×3 IMPLANT
WATER STERILE IRR 1000ML POUR (IV SOLUTION) ×3 IMPLANT

## 2015-08-14 NOTE — Progress Notes (Signed)
[facility] Progress Note  Assessment/Plan: 1. Staph aureus bacteremia/sepsis cultures pending 1/2 staph aureus, CXR neg, does not make urine - most likely etiology is thigh graft - MRSA + PCR; taken for graft revision with excision of infected segment and replacement lateral part of graft 2. ESRD - TTS - hold heparin;  Next HD Tuesday - use medial graft only x 4 weeks 3. Chronic hypotension/volume - chronic low BP on HD -gains 3-4 kg - CXR clear; keep BP > 85, midodrine for BP support; net UF 2.5 L on early 9/5 am with post weight 123.9 -BP low post op 60s given neo on Bipap, hx OSA with CPAP- BP coming up 4. Anemia - Hgb 11-12 stable no ESA or Fe since last November - follow hgb post op 5. Metabolic bone disease - Continue hectorol, iPTH controlled 6. Nutrition -Alb 4 advance as tolerate - vitamin 7. Hx PE - on daily lovenox, changed to heparin here 8. DM - pr primary 9. OSA  Myriam Jacobson, PA-C [facility] [place] [phone] 08/14/2015,11:59 AM  LOS: 2 days   Subjective:   On bipap  Objective Filed Vitals:   08/14/15 1130 08/14/15 1141 08/14/15 1145 08/14/15 1147  BP: 69/25 59/28 100/55 115/75  Pulse: 52 80 63 65  Temp:      TempSrc:      Resp: 13 19 7 6   Height:      Weight:      SpO2: 100% 100% 98% 99%   Physical Exam in PACU General: drowsy, rousable Heart:  RRR Lungs: clear anteriorly Abdomen: obese Extremities: no overt edema Dialysis Access: left thigh AVGG dressing intact + bruit  Dialysis Orders: TTS [facility] 4.45 hr 2 K 2.5 Ca 180 EDW 122.5 no profile 36 degrees thigh graft heparin 3000 with 1000 mid tmt, Hectorol 2 no ESA or Fe Recent labs; Hgb 11.7 Ca 10 (usually less) P ok iPTH 201  Additional Objective Labs: Basic Metabolic Panel:  Recent Labs Lab 08/12/15 1748 08/13/15 0238  NA 133* 131*  K 4.3 4.5  CL 96* 96*  CO2 24 25  GLUCOSE 122* 139*  BUN 28* 30*  CREATININE 6.57* 7.40*  CALCIUM 9.5 8.7*   Liver  Function Tests:  Recent Labs Lab 08/12/15 1748  AST 21  ALT 19  ALKPHOS 166*  BILITOT 1.3*  PROT 8.5*  ALBUMIN 4.0    Recent Labs Lab 08/13/15 1036  LIPASE 33   CBC:  Recent Labs Lab 08/12/15 1748 08/13/15 0238  WBC 6.8 6.0  HGB 12.7 11.4*  HCT 40.8 36.0  MCV 92.5 92.3  PLT 168 155   Blood Culture    Component Value Date/Time   SDES BLOOD RIGHT ARM 08/12/2015 2250   SPECREQUEST BOTTLES DRAWN AEROBIC AND ANAEROBIC 6CC 08/12/2015 2250   CULT NO GROWTH < 12 HOURS 08/12/2015 2250   REPTSTATUS PENDING 08/12/2015 2250   CBG:  Recent Labs Lab 08/13/15 1712 08/13/15 2008 08/14/15 0012 08/14/15 0525 08/14/15 0731  GLUCAP 85 97 98 93 80   Studies/Results: Dg Chest Port 1 View  08/12/2015   CLINICAL DATA:  Headache.  Chills.  Nausea and vomiting for 1 day.  EXAM: PORTABLE CHEST - 1 VIEW  COMPARISON:  08/12/2014  FINDINGS: Vascular stent over the right mediastinum, likely in the SVC.  Thoracic spondylosis. Heart size within normal limits. Upper zone pulmonary vascular prominence, indeterminate due to the semi recumbent positioning. No overt edema. The lungs appear otherwise clear.  IMPRESSION: 1. No edema.  Vascular stent along the right mediastinum presumably in the SVC. 2. Thoracic spondylosis.   Electronically Signed   By: Van Clines M.D.   On: 08/12/2015 18:32   Dg Abd 2 Views  08/13/2015   CLINICAL DATA:  End-stage renal disease on hemodialysis. Abdominal pain.  EXAM: ABDOMEN - 2 VIEW  COMPARISON:  None.  FINDINGS: No dilated loops of large or small bowel. Gas and stool in the rectum. No pathologic calcifications. No organomegaly. No acute osseous abnormality  IMPRESSION: No acute abdominal findings   Electronically Signed   By: Suzy Bouchard M.D.   On: 08/13/2015 13:57   Medications:   . antiseptic oral rinse  7 mL Mouth Rinse BID  . calcium acetate  1,334 mg Oral TID WC  . calcium carbonate  1,000 mg Oral QAC supper  . Chlorhexidine Gluconate Cloth  6  each Topical Q0600  . clonazePAM  1 mg Oral QHS  . doxercalciferol      . doxercalciferol  2 mcg Intravenous Q T,Th,Sa-HD  . heparin  5,000 Units Subcutaneous 3 times per day  . insulin aspart  0-9 Units Subcutaneous 6 times per day  . insulin glargine  15 Units Subcutaneous QHS  . midodrine      . midodrine  5 mg Oral BID WC  . multivitamin  1 tablet Oral QHS  . mupirocin ointment  1 application Nasal BID  . sodium chloride  3 mL Intravenous Q12H  . sodium chloride  3 mL Intravenous Q12H  . vancomycin  1,000 mg Intravenous Q T,Th,Sa-HD

## 2015-08-14 NOTE — Progress Notes (Signed)
Informed pt's husband of transfer to [facility]. Pt's husband took pt's bag of clothing as well as earrings that were in a dentures case. Pt's husband stated he was going home and would return later.

## 2015-08-14 NOTE — Anesthesia Preprocedure Evaluation (Addendum)
Anesthesia Evaluation  Patient identified by MRN, date of birth, ID band Patient awake    Reviewed: Allergy & Precautions, NPO status , Patient's Chart, lab work & pertinent test results  History of Anesthesia Complications (+) PROLONGED EMERGENCE and history of anesthetic complications  Airway Mallampati: III  TM Distance: >3 FB     Dental  (+) Poor Dentition, Dental Advisory Given   Pulmonary sleep apnea , COPD   Pulmonary exam normal       Cardiovascular hypertension, Normal cardiovascular exam Study Conclusions  - Left ventricle: The cavity size was normal. Wall thickness was normal. Systolic function was normal. The estimated ejection fraction was in the range of 60% to 65%. Wall motion was normal; there were no regional wall motion abnormalities. - Tricuspid valve: There was mild-moderate regurgitation. - Pulmonary arteries: Systolic pressure was mildly increased. PA peak pressure: 44 mm Hg (S).    Neuro/Psych    GI/Hepatic GERD-  ,  Endo/Other  diabetesMorbid obesity  Renal/GU ESRF and DialysisRenal disease     Musculoskeletal   Abdominal   Peds  Hematology   Anesthesia Other Findings   Reproductive/Obstetrics                           Anesthesia Physical Anesthesia Plan  ASA: III  Anesthesia Plan: General   Post-op Pain Management:    Induction: Intravenous  Airway Management Planned: Oral ETT  Additional Equipment:   Intra-op Plan:   Post-operative Plan: Extubation in OR  Informed Consent: I have reviewed the patients History and Physical, chart, labs and discussed the procedure including the risks, benefits and alternatives for the proposed anesthesia with the patient or authorized representative who has indicated his/her understanding and acceptance.   Dental advisory given  Plan Discussed with: CRNA, Anesthesiologist and Surgeon  Anesthesia Plan  Comments:        Anesthesia Quick Evaluation

## 2015-08-14 NOTE — Consult Note (Deleted)
PULMONARY / CRITICAL CARE MEDICINE   Name: [name] MRN: [id number] DOB: 1961/02/08    ADMISSION DATE:  08/12/2015 CONSULTATION DATE:  9/4  REFERRING MD :  Elgergawy  CHIEF COMPLAINT:  Hypotension   INITIAL PRESENTATION:   54 y.o. female with a history of ESRD on HD since 1992. Admitted on 9/2 w/ SA bacteremia felt to be due to infected thigh AVG. Went to OR 9/4 underwent removal of infected segment and revision of left thigh AVGF. Post-op she was hypotensive and on NIPPV. She was sent to the ICU for recovery where we were asked to assume care.    STUDIES:    SIGNIFICANT EVENTS: 9/2 admitted w/ working dx of sepsis from infected thigh AVG. abx started.  9/3 seen by vascular.  9/4 to OR as blood cultures were positive and no other clear source other than AVG. Infected are removed and AVG revised. Hypotensive after surgery. PCCM asked to assume care in ICU   HISTORY OF PRESENT ILLNESS:  54 y.o. female with a history of ESRD on HD since 1992 who presents to the ED on 9/2 with complaints of Fever Chills and Myalgia x 2 days with headache, nausea and vomiting.She reported SOB and in the ED she was found to have hypoxia to 85% and was placed on 3 liters NCO2 and improved to 94%. She had a temperature of 102 in the ED. Chest Xray was negative for any acute findings, and a UA and Blood Cultures were ordered.She was placed on IV Vancomycin and Primaxin and referred for admission. She did report drainage from a scab on lateral left thigh graft and that it was more tender than usual. BC were reported as + GPCs. Went to OR 9/4 underwent removal of infected segment and revision of left thigh AVGF. Post-op she was hypotensive and on NIPPV. She was sent to the ICU for recovery where we were asked to assume care. PAST MEDICAL HISTORY :   has a past medical history of HTN (hypertension); Obesities, morbid; GERD (gastroesophageal reflux disease); PE (pulmonary embolism) (~ 2010); Superior  vena cava syndrome; Pneumonia; Shortness of breath; OSA on CPAP; Iron deficiency anemia; Chronic hypotension; COPD (chronic obstructive pulmonary disease); ESRD (end stage renal disease) on dialysis; Diabetes mellitus type 2 in obese; and Complication of anesthesia.  has past surgical history that includes Vascular surgery; Thrombectomy and revision of arterioventous (av) goretex  graft (Left, ~ 12/2012); Arteriovenous graft placement (Left, ~ 2011); Carpal tunnel release (Right, ~ 2011); and Carpal tunnel release (Right, 01/28/2015). Prior to Admission medications   Medication Sig Start Date End Date Taking? Authorizing Provider  albuterol (PROVENTIL HFA;VENTOLIN HFA) 108 (90 BASE) MCG/ACT inhaler Inhale 2 puffs into the lungs every 6 (six) hours as needed. For shortness of breath. Patient taking differently: Inhale 2 puffs into the lungs every 6 (six) hours as needed for wheezing or shortness of breath.  03/27/14  Yes Adeline C Viyuoh, MD  B Complex-C (B-COMPLEX WITH VITAMIN C) tablet Take 1 tablet by mouth daily.   Yes Historical Provider, MD  calcium acetate (PHOSLO) 667 MG capsule Take 1,334 mg by mouth 3 (three) times daily with meals.    Yes Historical Provider, MD  calcium elemental as carbonate (TUMS ULTRA 1000) 400 MG tablet Chew 1,000 mg by mouth daily.   Yes Historical Provider, MD  clonazePAM (KLONOPIN) 1 MG tablet Take 1 mg by mouth at bedtime.   Yes Historical Provider, MD  insulin aspart (NOVOLOG FLEXPEN) 100 UNIT/ML injection Inject  12-15 Units into the skin 3 (three) times daily before meals. Per sliding scale   Yes Historical Provider, MD  insulin glargine (LANTUS) 100 UNIT/ML injection Inject 0.15 mLs (15 Units total) into the skin at bedtime. 03/31/13  Yes Geradine Girt, DO  metoCLOPramide (REGLAN) 5 MG tablet Take 5 mg by mouth at bedtime.     Yes Historical Provider, MD  midodrine (PROAMATINE) 5 MG tablet Take 5 mg by mouth 2 (two) times daily.     Yes Historical Provider, MD   oxyCODONE (OXY IR/ROXICODONE) 5 MG immediate release tablet Take 1 tablet (5 mg total) by mouth every 4 (four) hours as needed for severe pain. 01/28/15  Yes Roseanne Kaufman, MD  tiotropium (SPIRIVA) 18 MCG inhalation capsule Place 18 mcg into inhaler and inhale daily.   Yes Historical Provider, MD  doxycycline (VIBRAMYCIN) 100 MG capsule Take 1 capsule (100 mg total) by mouth 2 (two) times daily. Patient not taking: Reported on 01/25/2015 11/26/14   Britt Bottom, NP  methylPREDNIsolone (MEDROL DOSPACK) 4 MG tablet follow package directions Patient not taking: Reported on 08/12/2015 08/14/14   Albertine Patricia, MD   Allergies  Allergen Reactions  . Amoxicillin Anaphylaxis and Other (See Comments)    Throat closes and gets sweaty.  . Iohexol Other (See Comments)     Desc: scratchy/itchy throat/itching on back / nausea     FAMILY HISTORY:  indicated that her mother is deceased. She indicated that her father is deceased.  SOCIAL HISTORY:  reports that she has never smoked. She has never used smokeless tobacco. She reports that she does not drink alcohol or use illicit drugs.  REVIEW OF SYSTEMS:  Unable due to residual anesthesia   SUBJECTIVE:  Comfortable  VITAL SIGNS: Temp:  [97.5 F (36.4 C)-98.7 F (37.1 C)] 97.5 F (36.4 C) (09/04 1124) Pulse Rate:  [52-89] 77 (09/04 1309) Resp:  [4-20] 11 (09/04 1309) BP: (59-138)/(25-109) 72/32 mmHg (09/04 1309) SpO2:  [98 %-100 %] 100 % (09/04 1309) FiO2 (%):  [60 %] 60 % (09/04 1130) Weight:  [123.9 kg (273 lb 2.4 oz)-126.3 kg (278 lb 7.1 oz)] 123.9 kg (273 lb 2.4 oz) (09/04 0425) HEMODYNAMICS:   VENTILATOR SETTINGS: Vent Mode:  [-]  FiO2 (%):  [60 %] 60 % INTAKE / OUTPUT:  Intake/Output Summary (Last 24 hours) at 08/14/15 1313 Last data filed at 08/14/15 1055  Gross per 24 hour  Intake   1250 ml  Output   2500 ml  Net  -1250 ml    PHYSICAL EXAMINATION: General:  Pleasant 54 year old female, resting comfortably in bed   Neuro:  Awake, oriented, no focal def  HEENT:  NCAT, multiple scars/keloids on neck from old access sites  Cardiovascular:  rrr  VASC: right thigh AVG dressing intact  Lungs:  Decreased bases  Abdomen:  Soft, non-tender  Musculoskeletal:  Intact  Skin:  Intact   LABS:  CBC  Recent Labs Lab 08/12/15 1748 08/13/15 0238  WBC 6.8 6.0  HGB 12.7 11.4*  HCT 40.8 36.0  PLT 168 155   Coag's  Recent Labs Lab 08/12/15 2250  APTT 36  INR 1.28   BMET  Recent Labs Lab 08/12/15 1748 08/13/15 0238  NA 133* 131*  K 4.3 4.5  CL 96* 96*  CO2 24 25  BUN 28* 30*  CREATININE 6.57* 7.40*  GLUCOSE 122* 139*   Electrolytes  Recent Labs Lab 08/12/15 1748 08/13/15 0238  CALCIUM 9.5 8.7*   Sepsis Markers  Recent Labs Lab 08/12/15 2250 08/13/15 0238  LATICACIDVEN 0.8 0.8  PROCALCITON 3.09  --    ABG No results for input(s): PHART, PCO2ART, PO2ART in the last 168 hours. Liver Enzymes  Recent Labs Lab 08/12/15 1748  AST 21  ALT 19  ALKPHOS 166*  BILITOT 1.3*  ALBUMIN 4.0   Cardiac Enzymes No results for input(s): TROPONINI, PROBNP in the last 168 hours. Glucose  Recent Labs Lab 08/13/15 1140 08/13/15 1712 08/13/15 2008 08/14/15 0012 08/14/15 0525 08/14/15 0731  GLUCAP 114* 85 97 98 93 80    Imaging Dg Abd 2 Views  08/13/2015   CLINICAL DATA:  End-stage renal disease on hemodialysis. Abdominal pain.  EXAM: ABDOMEN - 2 VIEW  COMPARISON:  None.  FINDINGS: No dilated loops of large or small bowel. Gas and stool in the rectum. No pathologic calcifications. No organomegaly. No acute osseous abnormality  IMPRESSION: No acute abdominal findings   Electronically Signed   By: Suzy Bouchard M.D.   On: 08/13/2015 13:57     ASSESSMENT / PLAN:  PULMONARY OETT A:  Acute hypoxic resp failure ? Hypoventilation vs atx.  Currently on 100% FIO2 but don't think she needs that much  OSA P:   Wean FIO2 PCXR pulm hygiene  CPAP ->auto-titrate    CARDIOVASCULAR CVL  A: Post-op Hypotension. Suspect that this is a mix of residual anesthesia superimposed on underlying chronic hypotension on midodrine +/- sepsis  Difficult vascular access. Has had occluded bilateral IJ vasc sites, as well as occluded right fem site.  P:  Add neo for SBP goal >85 Ck lactic acid Add back midodrine  Cont low dose MIVFs  RENAL A:   ESRD P:   Per renal   GASTROINTESTINAL A:   Obesity  Aspiration risk  P:   NPO Adv diet as tol    HEMATOLOGIC A:  Anemia of chronic disease   P:  Trend CBC   INFECTIOUS A:   Staph bacteremia in setting of infected AVG P:   BCx2 9/2: staph aureus>>> Wound culture 9/4>>> vanc 9/2>>>  ENDOCRINE A:   DM P:   ssi   NEUROLOGIC A:   No acute  P:   Careful w/ narcs    FAMILY  - Updates: pending   - Inter-disciplinary family meet or Palliative Care meeting due by: 9/11    TODAY'S SUMMARY:  54 year old female admitted 9/2 w/ SA bacteremia in setting of infected left thigh AVG. Started on ABX, went to OR for infected graft resection, and revision. Returned to ICU post op for post-op hypotension. Suspect mix of residual anesthesia super-imposed on chronic hypotension.   Erick Colace ACNP-BC [place] Pager # [phone] OR # [phone] if no answer  Attending Note:  54 year old female with history of ESRD who was in the OR for removal of segment of an infected graft on the left.  Post op the patient was hypotensive.  Did not respond to IVF.  Was hypoxemic.  Came to the ICU on 100% NRB but was on BiPAP.  Was coherent during exam.  This is mostly residual anesthetic effect.  Will get a CXR to evaluate lung parenchyma tao evaluate if 100% and BiPAP really are required as I suspect the O2 sat prob with the patient's skin tone are not terribly accurate.  If an issue will order an ABG.  The patient is critically ill with multiple organ systems failure and requires high  complexity decision making for assessment  and support, frequent evaluation and titration of therapies, application of advanced monitoring technologies and extensive interpretation of multiple databases.   Critical Care Time devoted to patient care services described in this note is  35  Minutes. This time reflects time of care of this signee Dr Jennet Maduro. This critical care time does not reflect procedure time, or teaching time or supervisory time of PA/NP/Med student/Med Resident etc but could involve care discussion time.  Rush Farmer, M.D. [facility] Pulmonary/Critical Care Medicine. Pager: [phone]. After hours pager: [phone].  08/14/2015, 1:13 PM

## 2015-08-14 NOTE — Progress Notes (Signed)
Patient transferred from PACU to ICU for hypotension, transferred to [facility]. Emeline Gins Elelgergawy MD

## 2015-08-14 NOTE — Progress Notes (Signed)
Dr. Waldron Labs called back, he will let critical care evaluate patient.

## 2015-08-14 NOTE — Op Note (Signed)
    NAME: [name]   MRN: [id number] DOB: 09-Aug-1961    DATE OF OPERATION: 08/14/2015  PREOP DIAGNOSIS: Infected segment of left thigh AV graft  POSTOP DIAGNOSIS: same  PROCEDURE:  1. Revision of left thigh AV graft with interposition 7 mm PTFE graft 2. Removal of infected segment of left thigh AV graft  SURGEON: Judeth Cornfield. Scot Dock, MD, FACS  ASSIST: none  ANESTHESIA: Gen.   EBL: minimal  INDICATIONS: [name] is a 54 y.o. female who presented with fevers and a positive blood culture. The only potential source was felt to be an area that was draining from the lateral aspect of her left thigh AV graft which had drained purulent material.  FINDINGS: the graft proximal and distal to the area of concern was well incorporated. The entire graft did not appear to be infected. I only removed the segment of concern.  TECHNIQUE: The patient was taken to the operating room and received general anesthetic. The left thigh was prepped and draped in usual sterile fashion. A oblique incision was made proximal to the area of concern along the lateral aspect of the graft. Here the graft was dissected free. It was well incorporated. A separate oblique incision was made at the distal loop of the graft. Here the graft was dissected free. It was also well incorporated. A tunnel was created between the 2 incisions and a 7 mm PTFE graft tunneled between the 2 incisions. The patient was heparinized. The graft was clamped proximally and distally and divided each end. The new segment of graft was sewn into and at each end with continuous 60 proline suture. At the completion was a good thrill in the graft. Of note at the site of the distal loop there was a stent present which I did remove.   Hemostasis was then obtained in the wounds. At each end I dissected the remaining segment of graft and divided it allowing me to close the deeper layer and isolate the area of concern from the incisions where the  graft was revised. The wounds were closed with a deeper 3-0 Vicryl and the skin closed with 4-0 Vicryl. Liquid band was then placed over each incision and then these wounds were isolated with a dry 4 x 4 and tape.  Next an elliptical incision was made encompassing the area that had been draining purulent material. The entire segment of graft in this area was removed. There was no gross purulence but a segment of the graft was sent for culture and a swab was also sent. IMA stasis was obtained and this wound and this wound was closed with interrupted 40 nylons. Sterile dressing was applied. The patient tolerated the procedure well and transferred to recovery room in stable condition. All needle and sponge counts were correct.  CLINICAL NOTE: The new segment of graft along the lateral aspect of the graft should not be cannulated for 4 weeks. The medial aspect of the graft can be cannulated.  Holly Mayo, MD, FACS [facility]  DATE OF DICTATION:   08/14/2015

## 2015-08-14 NOTE — Consult Note (Signed)
PULMONARY / CRITICAL CARE MEDICINE   Name:[name] [id number] DOB:06/30/1961   ADMISSION DATE: 08/12/2015 CONSULTATION DATE: 9/4  REFERRING MD : Elgergawy  CHIEF COMPLAINT: Hypotension   INITIAL PRESENTATION:  54 y.o. female with a history of ESRD on HD since 1992. Admitted on 9/2 w/ SA bacteremia felt to be due to infected thigh AVG. Went to OR 9/4 underwent removal of infected segment and revision of left thigh AVGF. Post-op she was hypotensive and on NIPPV. She was sent to the ICU for recovery where we were asked to assume care.    STUDIES:    SIGNIFICANT EVENTS: 9/2 admitted w/ working dx of sepsis from infected thigh AVG. abx started.  9/3 seen by vascular.  9/4 to OR as blood cultures were positive and no other clear source other than AVG. Infected are removed and AVG revised. Hypotensive after surgery. PCCM asked to assume care in ICU   HISTORY OF PRESENT ILLNESS:  54 y.o. female with a history of ESRD on HD since 1992 who presents to the ED on 9/2 with complaints of Fever Chills and Myalgia x 2 days with headache, nausea and vomiting.She reported SOB and in the ED she was found to have hypoxia to 85% and was placed on 3 liters NCO2 and improved to 94%. She had a temperature of 102 in the ED. Chest Xray was negative for any acute findings, and a UA and Blood Cultures were ordered.She was placed on IV Vancomycin and Primaxin and referred for admission. She did report drainage from a scab on lateral left thigh graft and that it was more tender than usual. BC were reported as + GPCs. Went to OR 9/4 underwent removal of infected segment and revision of left thigh AVGF. Post-op she was hypotensive and on NIPPV. She was sent to the ICU for recovery where we were asked to assume care. PAST MEDICAL HISTORY :   has a past medical history of HTN (hypertension); Obesities, morbid; GERD (gastroesophageal reflux disease); PE (pulmonary  embolism) (~ 2010); Superior vena cava syndrome; Pneumonia; Shortness of breath; OSA on CPAP; Iron deficiency anemia; Chronic hypotension; COPD (chronic obstructive pulmonary disease); ESRD (end stage renal disease) on dialysis; Diabetes mellitus type 2 in obese; and Complication of anesthesia.  has past surgical history that includes Vascular surgery; Thrombectomy and revision of arterioventous (av) goretex graft (Left, ~ 12/2012); Arteriovenous graft placement (Left, ~ 2011); Carpal tunnel release (Right, ~ 2011); and Carpal tunnel release (Right, 01/28/2015). Prior to Admission medications   Medication Sig Start Date End Date Taking? Authorizing Provider  albuterol (PROVENTIL HFA;VENTOLIN HFA) 108 (90 BASE) MCG/ACT inhaler Inhale 2 puffs into the lungs every 6 (six) hours as needed. For shortness of breath. Patient taking differently: Inhale 2 puffs into the lungs every 6 (six) hours as needed for wheezing or shortness of breath.  03/27/14  Yes Adeline C Viyuoh, MD  B Complex-C (B-COMPLEX WITH VITAMIN C) tablet Take 1 tablet by mouth daily.   Yes Historical Provider, MD  calcium acetate (PHOSLO) 667 MG capsule Take 1,334 mg by mouth 3 (three) times daily with meals.    Yes Historical Provider, MD  calcium elemental as carbonate (TUMS ULTRA 1000) 400 MG tablet Chew 1,000 mg by mouth daily.   Yes Historical Provider, MD  clonazePAM (KLONOPIN) 1 MG tablet Take 1 mg by mouth at bedtime.   Yes Historical Provider, MD  insulin aspart (NOVOLOG FLEXPEN) 100 UNIT/ML injection Inject 12-15 Units into the skin 3 (three) times daily before  meals. Per sliding scale   Yes Historical Provider, MD  insulin glargine (LANTUS) 100 UNIT/ML injection Inject 0.15 mLs (15 Units total) into the skin at bedtime. 03/31/13  Yes Geradine Girt, DO  metoCLOPramide (REGLAN) 5 MG tablet Take 5 mg by mouth at bedtime.    Yes Historical Provider, MD  midodrine (PROAMATINE) 5 MG  tablet Take 5 mg by mouth 2 (two) times daily.    Yes Historical Provider, MD  oxyCODONE (OXY IR/ROXICODONE) 5 MG immediate release tablet Take 1 tablet (5 mg total) by mouth every 4 (four) hours as needed for severe pain. 01/28/15  Yes Roseanne Kaufman, MD  tiotropium (SPIRIVA) 18 MCG inhalation capsule Place 18 mcg into inhaler and inhale daily.   Yes Historical Provider, MD  doxycycline (VIBRAMYCIN) 100 MG capsule Take 1 capsule (100 mg total) by mouth 2 (two) times daily. Patient not taking: Reported on 01/25/2015 11/26/14   Britt Bottom, NP  methylPREDNIsolone (MEDROL DOSPACK) 4 MG tablet follow package directions Patient not taking: Reported on 08/12/2015 08/14/14   Albertine Patricia, MD   Allergies  Allergen Reactions  . Amoxicillin Anaphylaxis and Other (See Comments)    Throat closes and gets sweaty.  . Iohexol Other (See Comments)    Desc: scratchy/itchy throat/itching on back / nausea     FAMILY HISTORY:  indicated that her mother is deceased. She indicated that her father is deceased.  SOCIAL HISTORY:  reports that she has never smoked. She has never used smokeless tobacco. She reports that she does not drink alcohol or use illicit drugs.  REVIEW OF SYSTEMS: Unable due to residual anesthesia   SUBJECTIVE:  Comfortable  VITAL SIGNS: Temp: [97.5 F (36.4 C)-98.7 F (37.1 C)] 97.5 F (36.4 C) (09/04 1124) Pulse Rate: [52-89] 77 (09/04 1309) Resp: [4-20] 11 (09/04 1309) BP: (59-138)/(25-109) 72/32 mmHg (09/04 1309) SpO2: [98 %-100 %] 100 % (09/04 1309) FiO2 (%): [60 %] 60 % (09/04 1130) Weight: [123.9 kg (273 lb 2.4 oz)-126.3 kg (278 lb 7.1 oz)] 123.9 kg (273 lb 2.4 oz) (09/04 0425) HEMODYNAMICS:   VENTILATOR SETTINGS: Vent Mode: [-]  FiO2 (%): [60 %] 60 % INTAKE / OUTPUT:  Intake/Output Summary (Last 24 hours) at 08/14/15 1313 Last data filed at 08/14/15 1055  Gross per 24 hour  Intake  1250 ml   Output  2500 ml  Net -1250 ml    PHYSICAL EXAMINATION: General: Pleasant 54 year old female, resting comfortably in bed  Neuro: Awake, oriented, no focal def  HEENT: NCAT, multiple scars/keloids on neck from old access sites  Cardiovascular: rrr  VASC: right thigh AVG dressing intact  Lungs: Decreased bases  Abdomen: Soft, non-tender  Musculoskeletal: Intact  Skin: Intact   LABS:  CBC  Last Labs      Recent Labs Lab 08/12/15 1748 08/13/15 0238  WBC 6.8 6.0  HGB 12.7 11.4*  HCT 40.8 36.0  PLT 168 155     Coag's  Last Labs      Recent Labs Lab 08/12/15 2250  APTT 36  INR 1.28     BMET  Last Labs      Recent Labs Lab 08/12/15 1748 08/13/15 0238  NA 133* 131*  K 4.3 4.5  CL 96* 96*  CO2 24 25  BUN 28* 30*  CREATININE 6.57* 7.40*  GLUCOSE 122* 139*     Electrolytes  Last Labs      Recent Labs Lab 08/12/15 1748 08/13/15 0238  CALCIUM 9.5 8.7*  Sepsis Markers  Last Labs      Recent Labs Lab 08/12/15 2250 08/13/15 0238  LATICACIDVEN 0.8 0.8  PROCALCITON 3.09 --      ABG  Last Labs     No results for input(s): PHART, PCO2ART, PO2ART in the last 168 hours.   Liver Enzymes  Last Labs      Recent Labs Lab 08/12/15 1748  AST 21  ALT 19  ALKPHOS 166*  BILITOT 1.3*  ALBUMIN 4.0     Cardiac Enzymes  Last Labs     No results for input(s): TROPONINI, PROBNP in the last 168 hours.   Glucose  Last Labs      Recent Labs Lab 08/13/15 1140 08/13/15 1712 08/13/15 2008 08/14/15 0012 08/14/15 0525 08/14/15 0731  GLUCAP 114* 85 97 98 93 80      Imaging Dg Abd 2 Views  08/13/2015 CLINICAL DATA: End-stage renal disease on hemodialysis. Abdominal pain. EXAM: ABDOMEN - 2 VIEW COMPARISON: None. FINDINGS: No dilated loops of large or small bowel. Gas and stool in the rectum. No pathologic calcifications. No  organomegaly. No acute osseous abnormality IMPRESSION: No acute abdominal findings Electronically Signed By: Suzy Bouchard M.D. On: 08/13/2015 13:57     ASSESSMENT / PLAN:  PULMONARY OETT A:  Acute hypoxic resp failure ? Hypoventilation vs atx.  Currently on 100% FIO2 but don't think she needs that much  OSA P:  Wean FIO2 PCXR pulm hygiene  CPAP ->auto-titrate   CARDIOVASCULAR CVL  A: Post-op Hypotension. Suspect that this is a mix of residual anesthesia superimposed on underlying chronic hypotension on midodrine +/- sepsis  Difficult vascular access. Has had occluded bilateral IJ vasc sites, as well as occluded right fem site.  P:  Add neo for SBP goal >85 Ck lactic acid Add back midodrine  Cont low dose MIVFs  RENAL A:  ESRD P:  Per renal   GASTROINTESTINAL A:  Obesity  Aspiration risk  P:  NPO Adv diet as tol    HEMATOLOGIC A:  Anemia of chronic disease   P:  Trend CBC   INFECTIOUS A:  Staph bacteremia in setting of infected AVG P:  BCx2 9/2: staph aureus>>> Wound culture 9/4>>> vanc 9/2>>>  ENDOCRINE A:  DM P:  ssi   NEUROLOGIC A:  No acute  P:  Careful w/ narcs    FAMILY  - Updates: pending   - Inter-disciplinary family meet or Palliative Care meeting due by: 9/11    TODAY'S SUMMARY:  54 year old female admitted 9/2 w/ SA bacteremia in setting of infected left thigh AVG. Started on ABX, went to OR for infected graft resection, and revision. Returned to ICU post op for post-op hypotension. Suspect mix of residual anesthesia super-imposed on chronic hypotension.   Erick Colace ACNP-BC [place] Pager # [phone] OR # [phone] if no answer  Attending Note:  54 year old female with history of ESRD who was in the OR for removal of segment of an infected graft on the left. Post op the patient was hypotensive. Did not respond to IVF. Was hypoxemic. Came  to the ICU on 100% NRB but was on BiPAP. Was coherent during exam. This is mostly residual anesthetic effect. Will get a CXR to evaluate lung parenchyma tao evaluate if 100% and BiPAP really are required as I suspect the O2 sat prob with the patient's skin tone are not terribly accurate. If an issue will order an ABG.  The patient is critically ill  with multiple organ systems failure and requires high complexity decision making for assessment and support, frequent evaluation and titration of therapies, application of advanced monitoring technologies and extensive interpretation of multiple databases.   Critical Care Time devoted to patient care services described in this note is  35  Minutes. This time reflects time of care of this signee Dr Jennet Maduro. This critical care time does not reflect procedure time, or teaching time or supervisory time of PA/NP/Med student/Med Resident etc but could involve care discussion time.  Rush Farmer, M.D. [facility] Pulmonary/Critical Care Medicine. Pager: [phone]. After hours pager: [phone].

## 2015-08-14 NOTE — Progress Notes (Signed)
Lab called stated after 3 attempts they were unsuccessful with receiving blood cultures from pt. Dr Lamonte Sakai notified.

## 2015-08-14 NOTE — Anesthesia Postprocedure Evaluation (Signed)
  Anesthesia Post-op Note  Patient: [name]  Procedure(s) Performed: Procedure(s): REVISION OF ARTERIOVENOUS GORETEX GRAFT LEFT THIGH with  REMOVAL of segment of infected graft. (Left)  Patient Location: PACU  Anesthesia Type:General  Level of Consciousness: sedated  Airway and Oxygen Therapy: BiPAP  Post-op Pain: mild  Post-op Assessment: Post-op Vital signs reviewed              Post-op Vital Signs: unstable  Last Vitals:  Filed Vitals:   08/14/15 1330  BP:   Pulse: 63  Temp: 36.6 C  Resp: 9    Complications: Pt's preoperative hypotension persists.  d/c to ICU

## 2015-08-14 NOTE — Progress Notes (Signed)
[place] Progress Note Patient Name: [name] DOB: Aug 10, 1961 MRN: [id number]   Date of Service  08/14/2015  HPI/Events of Note  Notified by RN that we are unable to obtain blood cx's due to her difficult vasculature, chronic renal failure.  Also note she only has one PIV for access currently.   eICU Interventions  She is on broad IV abx, has S aureus documented from cx on 9/2. I believe we can defer further sticks or an arterial puncture at this time. Will reconsider if she decompensates or if we believe the info will change our management.      Intervention Category Intermediate Interventions: Other:  Kailany Dinunzio S. 08/14/2015, 11:40 PM

## 2015-08-14 NOTE — Anesthesia Procedure Notes (Signed)
Procedure Name: Intubation Date/Time: 08/14/2015 9:06 AM Performed by: Scheryl Darter Pre-anesthesia Checklist: Patient identified, Emergency Drugs available, Suction available, Patient being monitored and Timeout performed Patient Re-evaluated:Patient Re-evaluated prior to inductionOxygen Delivery Method: Circle system utilized Preoxygenation: Pre-oxygenation with 100% oxygen Intubation Type: IV induction Ventilation: Mask ventilation without difficulty Laryngoscope Size: Mac and 3 Grade View: Grade I Tube type: Oral Tube size: 7.5 mm Number of attempts: 1 Airway Equipment and Method: Stylet Placement Confirmation: ETT inserted through vocal cords under direct vision,  positive ETCO2 and breath sounds checked- equal and bilateral Secured at: 23 cm Tube secured with: Tape Dental Injury: Teeth and Oropharynx as per pre-operative assessment

## 2015-08-14 NOTE — Progress Notes (Signed)
   VASCULAR SURGERY ASSESSMENT & PLAN:  * Possible infected segment of left thigh AVG or infected AVG: Given on blood culture with gram positive cocci and no other clear source, I think that the segment along the lateral aspect of the graft is the most likely source. I have recommended exploration of her graft. If the areas of the graft above and below the area of concern are not infected and I will bypass around this area and excised the infected segment. If the entire graft is infected and clearly this would have to be removed. She would have to have a catheter replaced 48 hours later. She did undergo dialysis late last night.  * She has been added onto the schedule this morning.   SUBJECTIVE: No specific complaints.  PHYSICAL EXAM: Filed Vitals:   08/14/15 0330 08/14/15 0400 08/14/15 0425 08/14/15 0526  BP: 96/55 87/38 122/55 100/58  Pulse: 80 77 80 82  Temp:   98 F (36.7 C) 98.1 F (36.7 C)  TempSrc:   Oral Oral  Resp: 16  18 18   Height:      Weight:   273 lb 2.4 oz (123.9 kg)   SpO2:   98% 100%   The left thigh AV graft has a bruit and thrill. The area of induration over the lateral aspect of the graft is unchanged.  MICRO: One blood culture has no growth for 12 hours but one has gram positive cocci in clusters.   LABS:    Lab Results  Component Value Date   WBC 6.0 08/13/2015   HGB 11.4* 08/13/2015   HCT 36.0 08/13/2015   MCV 92.3 08/13/2015   PLT 155 08/13/2015   Lab Results  Component Value Date   CREATININE 7.40* 08/13/2015   Lab Results  Component Value Date   INR 1.28 08/12/2015   CBG (last 3)   Recent Labs  08/13/15 2008 08/14/15 0012 08/14/15 0525  GLUCAP 97 98 93    Active Problems:   ESRD on hemodialysis   Acute respiratory failure with hypoxia   Sepsis   SIRS (systemic inflammatory response syndrome)   Diabetes mellitus type 2 in obese   OSA on CPAP   Chronic hypotension   COPD (chronic obstructive pulmonary disease)   Gae Gallop [id number] 08/14/2015

## 2015-08-14 NOTE — Progress Notes (Signed)
[place] Progress Note Patient Name: [name] DOB: Jul 10, 1961 MRN: [id number]   Date of Service  08/14/2015  HPI/Events of Note  Patient back from HD shunt revision. Aware, alert and able to handle ice chips.   eICU Interventions  Will advance to diabetic and renal clear liquid diet.      Intervention Category Minor Interventions: Routine modifications to care plan (e.g. PRN medications for pain, fever)  Cherrell Maybee Eugene 08/14/2015, 7:51 PM

## 2015-08-14 NOTE — Transfer of Care (Signed)
Immediate Anesthesia Transfer of Care Note  Patient: [name]  Procedure(s) Performed: Procedure(s): REVISION OF ARTERIOVENOUS GORETEX GRAFT LEFT THIGH with  REMOVAL of segment of infected graft. (Left)  Patient Location: PACU  Anesthesia Type:General  Level of Consciousness: awake, alert , oriented and sedated  Airway & Oxygen Therapy: Patient Spontanous Breathing and Patient connected to nasal cannula oxygen  Post-op Assessment: Report given to RN, Post -op Vital signs reviewed and stable and Patient moving all extremities  Post vital signs: Reviewed and stable  Last Vitals:  Filed Vitals:   08/14/15 1124  BP:   Pulse:   Temp: 36.4 C  Resp:     Complications: No apparent anesthesia complications

## 2015-08-14 NOTE — Progress Notes (Signed)
Dr. Waldron Labs called , he said patient to be transferred to 95M and Dr. Nelda Marseille will see patient.

## 2015-08-14 NOTE — Progress Notes (Signed)
Made aware at shift change that patient is going to surgery immdiately for left thigh AVG revision.  Consent obtained from patient.  Per patient's request, attempted to contact husband but received automated voice mail.  Per patient's request, then contacted her niece, Sharyn Lull, at [phone].  She was made aware and all questions answered.  She will contact the rest of family and make aware.  Sharyn Lull was concerned about the anesthesia type patient was to receive.  She stated that patient should not be put under general anesthesia due to patient's respiratory issues.  I talked with patient and she stated the same thing and stated she should be put under light sedation.  I called and made OR aware and advised patient that anesthesia would see her downstairs.  Also, earrings were put in a denture cup and are at patient's bedside.  [employer] RN-BC, WTA.

## 2015-08-15 ENCOUNTER — Inpatient Hospital Stay (HOSPITAL_COMMUNITY): Payer: Medicare Other

## 2015-08-15 DIAGNOSIS — E1122 Type 2 diabetes mellitus with diabetic chronic kidney disease: Secondary | ICD-10-CM

## 2015-08-15 DIAGNOSIS — A419 Sepsis, unspecified organism: Secondary | ICD-10-CM | POA: Insufficient documentation

## 2015-08-15 DIAGNOSIS — Z113 Encounter for screening for infections with a predominantly sexual mode of transmission: Secondary | ICD-10-CM | POA: Insufficient documentation

## 2015-08-15 DIAGNOSIS — Z9989 Dependence on other enabling machines and devices: Secondary | ICD-10-CM

## 2015-08-15 DIAGNOSIS — R579 Shock, unspecified: Secondary | ICD-10-CM

## 2015-08-15 DIAGNOSIS — T827XXA Infection and inflammatory reaction due to other cardiac and vascular devices, implants and grafts, initial encounter: Secondary | ICD-10-CM | POA: Insufficient documentation

## 2015-08-15 DIAGNOSIS — I959 Hypotension, unspecified: Secondary | ICD-10-CM

## 2015-08-15 DIAGNOSIS — I120 Hypertensive chronic kidney disease with stage 5 chronic kidney disease or end stage renal disease: Secondary | ICD-10-CM

## 2015-08-15 DIAGNOSIS — R6521 Severe sepsis with septic shock: Secondary | ICD-10-CM

## 2015-08-15 DIAGNOSIS — A4101 Sepsis due to Methicillin susceptible Staphylococcus aureus: Secondary | ICD-10-CM | POA: Insufficient documentation

## 2015-08-15 LAB — MAGNESIUM: Magnesium: 1.7 mg/dL (ref 1.7–2.4)

## 2015-08-15 LAB — BASIC METABOLIC PANEL
Anion gap: 11 (ref 5–15)
BUN: 28 mg/dL — ABNORMAL HIGH (ref 6–20)
CALCIUM: 7.5 mg/dL — AB (ref 8.9–10.3)
CO2: 25 mmol/L (ref 22–32)
CREATININE: 7.42 mg/dL — AB (ref 0.44–1.00)
Chloride: 98 mmol/L — ABNORMAL LOW (ref 101–111)
GFR, EST AFRICAN AMERICAN: 6 mL/min — AB (ref >60)
GFR, EST NON AFRICAN AMERICAN: 6 mL/min — AB (ref >60)
GLUCOSE: 88 mg/dL (ref 65–99)
Potassium: 4.1 mmol/L (ref 3.5–5.1)
Sodium: 134 mmol/L — ABNORMAL LOW (ref 135–145)

## 2015-08-15 LAB — GLUCOSE, CAPILLARY
GLUCOSE-CAPILLARY: 97 mg/dL (ref 65–99)
GLUCOSE-CAPILLARY: 103 mg/dL — AB (ref 65–99)
GLUCOSE-CAPILLARY: 86 mg/dL (ref 65–99)
GLUCOSE-CAPILLARY: 94 mg/dL (ref 65–99)
Glucose-Capillary: 130 mg/dL — ABNORMAL HIGH (ref 65–99)
Glucose-Capillary: 102 mg/dL — ABNORMAL HIGH (ref 65–99)

## 2015-08-15 LAB — RENAL FUNCTION PANEL
ANION GAP: 12 (ref 5–15)
Albumin: 3.5 g/dL (ref 3.5–5.0)
BUN: 29 mg/dL — ABNORMAL HIGH (ref 6–20)
CHLORIDE: 94 mmol/L — AB (ref 101–111)
CO2: 26 mmol/L (ref 22–32)
Calcium: 7.8 mg/dL — ABNORMAL LOW (ref 8.9–10.3)
Creatinine, Ser: 7.97 mg/dL — ABNORMAL HIGH (ref 0.44–1.00)
GFR, EST AFRICAN AMERICAN: 6 mL/min — AB (ref >60)
GFR, EST NON AFRICAN AMERICAN: 5 mL/min — AB (ref >60)
Glucose, Bld: 94 mg/dL (ref 65–99)
POTASSIUM: 4.2 mmol/L (ref 3.5–5.1)
Phosphorus: 5.2 mg/dL — ABNORMAL HIGH (ref 2.5–4.6)
Sodium: 132 mmol/L — ABNORMAL LOW (ref 135–145)

## 2015-08-15 LAB — CBC
HCT: 38.1 % (ref 36.0–46.0)
Hemoglobin: 11.3 g/dL — ABNORMAL LOW (ref 12.0–15.0)
MCH: 28.3 pg (ref 26.0–34.0)
MCHC: 29.7 g/dL — ABNORMAL LOW (ref 30.0–36.0)
MCV: 95.3 fL (ref 78.0–100.0)
Platelets: 182 10*3/uL (ref 150–400)
RBC: 4 MIL/uL (ref 3.87–5.11)
RDW: 14.4 % (ref 11.5–15.5)
WBC: 7.7 10*3/uL (ref 4.0–10.5)

## 2015-08-15 LAB — CULTURE, BLOOD (ROUTINE X 2)

## 2015-08-15 LAB — PHOSPHORUS: Phosphorus: 4.8 mg/dL — ABNORMAL HIGH (ref 2.5–4.6)

## 2015-08-15 LAB — POCT I-STAT 4, (NA,K, GLUC, HGB,HCT)
Glucose, Bld: 93 mg/dL (ref 65–99)
HCT: 38 % (ref 36.0–46.0)
HEMOGLOBIN: 12.9 g/dL (ref 12.0–15.0)
POTASSIUM: 4.1 mmol/L (ref 3.5–5.1)
Sodium: 138 mmol/L (ref 135–145)

## 2015-08-15 LAB — LACTIC ACID, PLASMA: LACTIC ACID, VENOUS: 1 mmol/L (ref 0.5–2.0)

## 2015-08-15 MED ORDER — INSULIN ASPART 100 UNIT/ML ~~LOC~~ SOLN
0.0000 [IU] | Freq: Three times a day (TID) | SUBCUTANEOUS | Status: DC
  Administered 2015-08-18: 1 [IU] via SUBCUTANEOUS

## 2015-08-15 MED ORDER — MIDODRINE HCL 5 MG PO TABS
10.0000 mg | ORAL_TABLET | Freq: Two times a day (BID) | ORAL | Status: DC
  Administered 2015-08-15: 10 mg via ORAL
  Filled 2015-08-15 (×2): qty 2

## 2015-08-15 MED ORDER — HEPARIN SODIUM (PORCINE) 1000 UNIT/ML DIALYSIS
4000.0000 [IU] | Freq: Once | INTRAMUSCULAR | Status: AC
  Administered 2015-08-16: 4000 [IU] via INTRAVENOUS_CENTRAL

## 2015-08-15 MED ORDER — LIDOCAINE HCL (PF) 1 % IJ SOLN: 5.0000 mL | INTRAMUSCULAR | Status: DC | PRN

## 2015-08-15 MED ORDER — ALTEPLASE 2 MG IJ SOLR
2.0000 mg | Freq: Once | INTRAMUSCULAR | Status: DC | PRN
  Filled 2015-08-15: qty 2

## 2015-08-15 MED ORDER — PHENYLEPHRINE HCL 10 MG/ML IJ SOLN
30.0000 ug/min | INTRAVENOUS | Status: DC
  Filled 2015-08-15: qty 1

## 2015-08-15 MED ORDER — SODIUM CHLORIDE 0.9 % IV SOLN: 100.0000 mL | INTRAVENOUS | Status: DC | PRN

## 2015-08-15 MED ORDER — LIDOCAINE-PRILOCAINE 2.5-2.5 % EX CREA
1.0000 "application " | TOPICAL_CREAM | CUTANEOUS | Status: DC | PRN
  Filled 2015-08-15: qty 5

## 2015-08-15 MED ORDER — HEPARIN SODIUM (PORCINE) 1000 UNIT/ML DIALYSIS: 3000.0000 [IU] | Freq: Once | INTRAMUSCULAR | Status: DC

## 2015-08-15 MED ORDER — PENTAFLUOROPROP-TETRAFLUOROETH EX AERO: 1.0000 "application " | INHALATION_SPRAY | CUTANEOUS | Status: DC | PRN

## 2015-08-15 MED ORDER — HEPARIN SODIUM (PORCINE) 1000 UNIT/ML DIALYSIS: 1000.0000 [IU] | INTRAMUSCULAR | Status: DC | PRN

## 2015-08-15 MED ORDER — HEPARIN SODIUM (PORCINE) 1000 UNIT/ML DIALYSIS
1000.0000 [IU] | INTRAMUSCULAR | Status: DC | PRN
  Filled 2015-08-15: qty 1

## 2015-08-15 MED ORDER — ALTEPLASE 2 MG IJ SOLR: 2.0000 mg | Freq: Once | INTRAMUSCULAR | Status: DC | PRN

## 2015-08-15 MED ORDER — MIDODRINE HCL 5 MG PO TABS
10.0000 mg | ORAL_TABLET | Freq: Three times a day (TID) | ORAL | Status: DC
  Administered 2015-08-15 – 2015-08-18 (×6): 10 mg via ORAL
  Filled 2015-08-15 (×15): qty 2

## 2015-08-15 MED ORDER — SODIUM CHLORIDE 0.9 % IV BOLUS (SEPSIS)
250.0000 mL | Freq: Once | INTRAVENOUS | Status: AC
  Administered 2015-08-15: 250 mL via INTRAVENOUS

## 2015-08-15 MED ORDER — LIDOCAINE-PRILOCAINE 2.5-2.5 % EX CREA: 1.0000 "application " | TOPICAL_CREAM | CUTANEOUS | Status: DC | PRN

## 2015-08-15 MED ORDER — SODIUM CHLORIDE 0.9 % IV SOLN: INTRAVENOUS | Status: DC

## 2015-08-15 MED ORDER — PENTAFLUOROPROP-TETRAFLUOROETH EX AERO
1.0000 "application " | INHALATION_SPRAY | CUTANEOUS | Status: DC | PRN
  Administered 2015-08-16: 1 via TOPICAL

## 2015-08-15 NOTE — Progress Notes (Signed)
   VASCULAR SURGERY ASSESSMENT & PLAN:  * 1 Day Post-Op s/p: Revision of left thigh AVG  *  Stick medial aspect of graft only and distal loop.  * Culture pending.  SUBJECTIVE: Sore  PHYSICAL EXAM: Filed Vitals:   08/15/15 0630 08/15/15 0645 08/15/15 0700 08/15/15 0737  BP: 113/64 105/69 107/63   Pulse: 60 72 80   Temp:    97.6 F (36.4 C)  TempSrc:    Oral  Resp: 18 20 18    Height:      Weight:      SpO2: 100% 100% 97%    Good thrill in left thigh AVG Incisions look fine.   LABS:  MICRO: Gram stain- no organisms seen.   Lab Results  Component Value Date   WBC 7.7 08/15/2015   HGB 11.3* 08/15/2015   HCT 38.1 08/15/2015   MCV 95.3 08/15/2015   PLT 182 08/15/2015   Lab Results  Component Value Date   CREATININE 7.40* 08/13/2015   Lab Results  Component Value Date   INR 1.28 08/12/2015   CBG (last 3)   Recent Labs  08/14/15 2013 08/14/15 2308 08/15/15 0407  GLUCAP 96 129* 130*    Active Problems:   ESRD on hemodialysis   Acute respiratory failure with hypoxia   Sepsis   SIRS (systemic inflammatory response syndrome)   Diabetes mellitus type 2 in obese   OSA on CPAP   Chronic hypotension   COPD (chronic obstructive pulmonary disease)   Pressure ulcer   Gae Gallop Beeper: [id number] 08/15/2015

## 2015-08-15 NOTE — Progress Notes (Signed)
  [facility] Progress Note   Subjective: feeling better, afebrile  Filed Vitals:   08/15/15 1115 08/15/15 1130 08/15/15 1138 08/15/15 1145  BP: 82/46 86/52  91/59  Pulse: 84 69  62  Temp:   97.5 F (36.4 C)   TempSrc:   Oral   Resp: 27 23  27   Height:      Weight:      SpO2: 84% 99%  98%   Exam: Alert, no distress No jvd Chest clear bilat RRR no MRG Abd soft ntnd obese L thigh AVG +bruit, clean wounds Neuro is nf, Ox 3  TTS [place]  4h 71min   2/2.5 bath   122.5kg   Heparin 3000/1000 mid Rx   L thigh AVG Hect 2ug Last pth 201, P ok Ca 10 Hb 11.7      Assessment: 1 AVG infection, sp revision 2 Staph sepsis 3 Hypotension, chronic issue on midodrine 4 ESRD 5 Anemia stable 6 Hx PE 7 DM  Plan - HD tomorrow, using medial/ distal limbs of AVG only    Kelly Splinter MD  pager [phone]    cell [phone]  08/15/2015, 11:53 AM     Recent Labs Lab 08/12/15 1748 08/13/15 0238 08/14/15 0832  NA 133* 131* 138  K 4.3 4.5 4.1  CL 96* 96*  --   CO2 24 25  --   GLUCOSE 122* 139* 93  BUN 28* 30*  --   CREATININE 6.57* 7.40*  --   CALCIUM 9.5 8.7*  --     Recent Labs Lab 08/12/15 1748  AST 21  ALT 19  ALKPHOS 166*  BILITOT 1.3*  PROT 8.5*  ALBUMIN 4.0    Recent Labs Lab 08/12/15 1748 08/13/15 0238 08/14/15 0832 08/15/15 0236  WBC 6.8 6.0  --  7.7  HGB 12.7 11.4* 12.9 11.3*  HCT 40.8 36.0 38.0 38.1  MCV 92.5 92.3  --  95.3  PLT 168 155  --  182   . antiseptic oral rinse  7 mL Mouth Rinse BID  . calcium acetate  1,334 mg Oral TID WC  . calcium carbonate  1,000 mg Oral QAC supper  . Chlorhexidine Gluconate Cloth  6 each Topical Q0600  . clonazePAM  1 mg Oral QHS  . doxercalciferol  2 mcg Intravenous Q T,Th,Sa-HD  . heparin  3,000 Units Dialysis Once in dialysis  . heparin  5,000 Units Subcutaneous 3 times per day  . insulin aspart  0-9 Units Subcutaneous 6 times per day  . insulin glargine  15 Units Subcutaneous QHS  . midodrine  10  mg Oral BID WC  . multivitamin  1 tablet Oral QHS  . mupirocin ointment  1 application Nasal BID  . sodium chloride  3 mL Intravenous Q12H  . sodium chloride  3 mL Intravenous Q12H  . vancomycin  1,000 mg Intravenous Q T,Th,Sa-HD   . phenylephrine (NEO-SYNEPHRINE) Adult infusion 40 mcg/min (08/15/15 1150)  . phenylephrine (NEO-SYNEPHRINE) Adult infusion 45 mcg/min (08/15/15 1103)   sodium chloride, sodium chloride, sodium chloride, acetaminophen **OR** acetaminophen, alteplase, heparin, ipratropium-albuterol, lidocaine (PF), lidocaine-prilocaine, ondansetron **OR** ondansetron (ZOFRAN) IV, oxyCODONE, pentafluoroprop-tetrafluoroeth, sodium chloride

## 2015-08-15 NOTE — Progress Notes (Signed)
Pressors turned off per Dr. Jonnie Finner. 250 NS bolus given. Will continue to monitor blood pressures. Cuff is on forearm and readings are questionable. Systolic is reading between 60 and 70 however, patient remains stable otherwise with no changes in mentation. Patient is alert and oriented talking on her phone and interacting with family at bedside.   Will notify Dr. Melvia Heaps is patients mental status changes or she becomes symptomatic.   Also Midodrine increased to 10mg  tid wc per Dr. Melvia Heaps.   Will continue to monitor.

## 2015-08-15 NOTE — Consult Note (Signed)
[facility]    Date of Admission:  08/12/2015  Date of Consult:  08/15/2015  Reason for Consult: Staphylococcus aureus bacteremia and graft infection with severe sepsis and septic shock Referring Physician: "Auto consult" and Dr. Nelda Marseille   HPI: [name] is an 54 y.o. female with IDDM with renal complications, HTN, ESRD on dialysis with limited access options who was admitted on September 2 with fever chills headache and nausea and vomiting. The time she was also suffering from a nonproductive cough. She seen emerge department where she was hypoxemic with a pulse ox of 85%. Her temperatures above 102 she was hypotensive blood cultures were obtained and she was placed on IV vancomycin and imipenem. There was initially concern the patient might have an infected thigh graft and apparently there was some purulence expressed from thigh graft. Fascia surgery saw her and ultimately took her to the operating room yesterday on September 4. In the interim her blood cultures have been positive for methicillin-sensitive Staphylococcus aureus as is a wound culture from her thigh graft.  She was taken to the OR yesterday and VVS performed:   1. Revision of left thigh AV graft with interposition 7 mm PTFE graft 2. Removal of infected segment of left thigh AV graft  She was with low BP requiring pressors and transferred to the ICU postoperatively. A she remains on pressors but has not been intubated and is feeling better.  Past Medical History  Diagnosis Date  . HTN (hypertension)   . Obesities, morbid   . GERD (gastroesophageal reflux disease)   . PE (pulmonary embolism) ~ 2010  . Superior vena cava syndrome     [name] 03/26/2013  . Pneumonia     "now & once a long time ago" (03/26/2013)  . Shortness of breath     "just a little bit; at any time" (03/26/2013)  . OSA on CPAP   . Iron deficiency anemia   . Chronic hypotension   . COPD (chronic  obstructive pulmonary disease)   . ESRD (end stage renal disease) on dialysis     "TTS; [place]; (03/25/2014)  . Diabetes mellitus type 2 in obese     insulin dependent  . Complication of anesthesia     confusion    Past Surgical History  Procedure Laterality Date  . Vascular surgery    . Thrombectomy and revision of arterioventous (av) goretex  graft Left ~ 12/2012    "thigh"   . Arteriovenous graft placement Left ~ 2011    "thigh" (4  . Carpal tunnel release Right ~ 2011  . Carpal tunnel release Right 01/28/2015    Procedure: RIGHT CARPAL TUNNEL RELEASE;  Surgeon: Roseanne Kaufman, MD;  Location: [facility];  Service: Orthopedics;  Laterality: Right;  ergies:   Allergies  Allergen Reactions  . Amoxicillin Anaphylaxis and Other (See Comments)    Throat closes and gets sweaty.  . Iohexol Other (See Comments)     Desc: scratchy/itchy throat/itching on back / nausea      Medications: I have reviewed patients current medications as documented in Epic Anti-infectives    Start     Dose/Rate Route Frequency Ordered Stop   08/13/15 1200  vancomycin (VANCOCIN) IVPB 1000 mg/200 mL premix     1,000 mg 200 mL/hr over 60 Minutes Intravenous Every T-Th-Sa (Hemodialysis) 08/12/15 2158     08/13/15 1000  imipenem-cilastatin (PRIMAXIN) 250 mg in sodium  chloride 0.9 % 100 mL IVPB  Status:  Discontinued     250 mg 200 mL/hr over 30 Minutes Intravenous Every 12 hours 08/12/15 2158 08/14/15 1034   08/12/15 1938  imipenem-cilastatin (PRIMAXIN) 500 mg in sodium chloride 0.9 % 100 mL IVPB     500 mg 200 mL/hr over 30 Minutes Intravenous  Once 08/12/15 1938 08/12/15 2152   08/12/15 1900  vancomycin (VANCOCIN) 2,000 mg in sodium chloride 0.9 % 500 mL IVPB     2,000 mg 250 mL/hr over 120 Minutes Intravenous  Once 08/12/15 1852 08/12/15 2146   08/12/15 1852  piperacillin-tazobactam (ZOSYN) IVPB 2.25 g  Status:  Discontinued     2.25 g 100 mL/hr over 30 Minutes Intravenous  Once 08/12/15 1852 08/12/15  2142      Social History:  reports that she has never smoked. She has never used smokeless tobacco. She reports that she does not drink alcohol or use illicit drugs.   Family History  Problem Relation Age of Onset  . Diabetes Mellitus II Mother   . Hypertension Mother   . Hypertension Father   . Diabetes Mellitus II Father       ROS: Pertinent for fevers chills headaches nausea myalgias ultimately some purulence from her thigh graft and for nonproductive cough. Negative for joint pains effusions lower back pain neck pain. Remainder of 12 point review of systems was negative   Blood pressure 71/56, pulse 80, temperature 97.5 F (36.4 C), temperature source Oral, resp. rate 14, height '5\' 1"'  (1.549 m), weight 277 lb 9 oz (125.9 kg), SpO2 100 %.   General: Alert and awake, oriented x3  HEENT: anicteric sclera,, EOMI, oropharynx clear and without exudate CVS regular rate, normal r,  no murmur rubs or gallops Chest: clear to auscultation bilaterally, no wheezing, rales or rhonchi Abdomen: soft nontender, nondistended, normal bowel sounds, Extremities:Skin: incision from surgery is clean on left thigh Neuro: nonfocal, strength and sensation intact   Results for orders placed or performed during the hospital encounter of 08/12/15 (from the past 48 hour(s))  Glucose, capillary     Status: None   Collection Time: 08/13/15  5:12 PM  Result Value Ref Range   Glucose-Capillary 85 65 - 99 mg/dL  Glucose, capillary     Status: None   Collection Time: 08/13/15  8:08 PM  Result Value Ref Range   Glucose-Capillary 97 65 - 99 mg/dL  Glucose, capillary     Status: None   Collection Time: 08/14/15 12:12 AM  Result Value Ref Range   Glucose-Capillary 98 65 - 99 mg/dL  Glucose, capillary     Status: None   Collection Time: 08/14/15  5:25 AM  Result Value Ref Range   Glucose-Capillary 93 65 - 99 mg/dL  Glucose, capillary     Status: None   Collection Time: 08/14/15  7:31 AM  Result Value  Ref Range   Glucose-Capillary 80 65 - 99 mg/dL  I-STAT 4, (NA,K, GLUC, HGB,HCT)     Status: None   Collection Time: 08/14/15  8:32 AM  Result Value Ref Range   Sodium 138 135 - 145 mmol/L   Potassium 4.1 3.5 - 5.1 mmol/L   Glucose, Bld 93 65 - 99 mg/dL   HCT 38.0 36.0 - 46.0 %   Hemoglobin 12.9 12.0 - 15.0 g/dL  Wound culture     Status: None (Preliminary result)   Collection Time: 08/14/15 10:36 AM  Result Value Ref Range   Specimen Description WOUND LEFT THIGH  Special Requests POF VANC AND ZOSYN  SPEC A ON SWAB    Gram Stain PENDING    Culture      NO GROWTH 1 DAY Performed at [facility]    Report Status PENDING   Tissue culture     Status: None (Preliminary result)   Collection Time: 08/14/15 10:42 AM  Result Value Ref Range   Specimen Description TISSUE LEFT THIGH    Special Requests POF ON VANC AND ZOSYN  SPEC B    Gram Stain      RARE WBC PRESENT, PREDOMINANTLY PMN NO ORGANISMS SEEN Performed at [facility]    Culture      NO GROWTH 1 DAY Performed at [facility]    Report Status PENDING   Glucose, capillary     Status: None   Collection Time: 08/14/15  1:48 PM  Result Value Ref Range   Glucose-Capillary 84 65 - 99 mg/dL  Glucose, capillary     Status: None   Collection Time: 08/14/15  4:17 PM  Result Value Ref Range   Glucose-Capillary 94 65 - 99 mg/dL  Glucose, capillary     Status: None   Collection Time: 08/14/15  8:13 PM  Result Value Ref Range   Glucose-Capillary 96 65 - 99 mg/dL  Glucose, capillary     Status: Abnormal   Collection Time: 08/14/15 11:08 PM  Result Value Ref Range   Glucose-Capillary 129 (H) 65 - 99 mg/dL  CBC     Status: Abnormal   Collection Time: 08/15/15  2:36 AM  Result Value Ref Range   WBC 7.7 4.0 - 10.5 K/uL   RBC 4.00 3.87 - 5.11 MIL/uL   Hemoglobin 11.3 (L) 12.0 - 15.0 g/dL   HCT 38.1 36.0 - 46.0 %   MCV 95.3 78.0 - 100.0 fL   MCH 28.3 26.0 - 34.0 pg   MCHC 29.7 (L) 30.0 - 36.0 g/dL    RDW 14.4 11.5 - 15.5 %   Platelets 182 150 - 400 K/uL  Glucose, capillary     Status: Abnormal   Collection Time: 08/15/15  4:07 AM  Result Value Ref Range   Glucose-Capillary 130 (H) 65 - 99 mg/dL   Comment 1 Notify RN   Glucose, capillary     Status: None   Collection Time: 08/15/15  7:35 AM  Result Value Ref Range   Glucose-Capillary 97 65 - 99 mg/dL  Glucose, capillary     Status: Abnormal   Collection Time: 08/15/15 11:33 AM  Result Value Ref Range   Glucose-Capillary 102 (H) 65 - 99 mg/dL  Lactic acid, plasma     Status: None   Collection Time: 08/15/15  1:00 PM  Result Value Ref Range   Lactic Acid, Venous 1.0 0.5 - 2.0 mmol/L  Basic metabolic panel     Status: Abnormal   Collection Time: 08/15/15  1:00 PM  Result Value Ref Range   Sodium 134 (L) 135 - 145 mmol/L   Potassium 4.1 3.5 - 5.1 mmol/L   Chloride 98 (L) 101 - 111 mmol/L   CO2 25 22 - 32 mmol/L   Glucose, Bld 88 65 - 99 mg/dL   BUN 28 (H) 6 - 20 mg/dL   Creatinine, Ser 7.42 (H) 0.44 - 1.00 mg/dL   Calcium 7.5 (L) 8.9 - 10.3 mg/dL   GFR calc non Af Amer 6 (L) >60 mL/min   GFR calc Af Amer 6 (L) >60 mL/min    Comment: (NOTE)  The eGFR has been calculated using the CKD EPI equation. This calculation has not been validated in all clinical situations. eGFR's persistently <60 mL/min signify possible Chronic Kidney Disease.    Anion gap 11 5 - 15  Magnesium     Status: None   Collection Time: 08/15/15  1:00 PM  Result Value Ref Range   Magnesium 1.7 1.7 - 2.4 mg/dL  Phosphorus     Status: Abnormal   Collection Time: 08/15/15  1:00 PM  Result Value Ref Range   Phosphorus 4.8 (H) 2.5 - 4.6 mg/dL   '@BRIEFLABTABLE' (sdes,specrequest,cult,reptstatus)   ) Recent Results (from the past 720 hour(s))  Blood culture (routine x 2)     Status: None   Collection Time: 08/12/15  5:36 PM  Result Value Ref Range Status   Specimen Description BLOOD LEFT ARM  Final   Special Requests BOTTLES DRAWN AEROBIC AND ANAEROBIC  5CC  Final   Culture  Setup Time   Final    AEROBIC BOTTLE ONLY GRAM POSITIVE COCCI IN CLUSTERS CRITICAL RESULT CALLED TO, READ BACK BY AND VERIFIED WITH: K.GENGLER,RN [id number] 08/14/15 M.Barrington BY R.GREENE    Culture STAPHYLOCOCCUS AUREUS  Final   Report Status 08/15/2015 FINAL  Final   Organism ID, Bacteria STAPHYLOCOCCUS AUREUS  Final      Susceptibility   Staphylococcus aureus - MIC*    CIPROFLOXACIN >=8 RESISTANT Resistant     ERYTHROMYCIN >=8 RESISTANT Resistant     GENTAMICIN <=0.5 SENSITIVE Sensitive     OXACILLIN 0.5 SENSITIVE Sensitive     TETRACYCLINE <=1 SENSITIVE Sensitive     VANCOMYCIN 1 SENSITIVE Sensitive     TRIMETH/SULFA <=10 SENSITIVE Sensitive     CLINDAMYCIN <=0.25 SENSITIVE Sensitive     RIFAMPIN <=0.5 SENSITIVE Sensitive     Inducible Clindamycin NEGATIVE Sensitive     * STAPHYLOCOCCUS AUREUS  Blood culture (routine x 2)     Status: None (Preliminary result)   Collection Time: 08/12/15 10:50 PM  Result Value Ref Range Status   Specimen Description BLOOD RIGHT ARM  Final   Special Requests BOTTLES DRAWN AEROBIC AND ANAEROBIC 6CC  Final   Culture NO GROWTH 2 DAYS  Final   Report Status PENDING  Incomplete  MRSA PCR Screening     Status: Abnormal   Collection Time: 08/12/15 11:44 PM  Result Value Ref Range Status   MRSA by PCR POSITIVE (A) NEGATIVE Final    Comment:        The GeneXpert MRSA Assay (FDA approved for NASAL specimens only), is one component of a comprehensive MRSA colonization surveillance program. It is not intended to diagnose MRSA infection nor to guide or monitor treatment for MRSA infections. RESULT CALLED TO, READ BACK BY AND VERIFIED WITH: K GENGLER'@0219'  08/13/15   Wound culture     Status: None (Preliminary result)   Collection Time: 08/13/15 11:09 AM  Result Value Ref Range Status   Specimen Description WOUND LEFT THIGH GRAFT  Final   Special Requests NONE  Final   Gram Stain   Final    NO WBC SEEN NO SQUAMOUS  EPITHELIAL CELLS SEEN RARE GRAM POSITIVE COCCI IN PAIRS Performed at [facility]    Culture   Final    ABUNDANT STAPHYLOCOCCUS AUREUS Note: RIFAMPIN AND GENTAMICIN SHOULD NOT BE USED AS SINGLE DRUGS FOR TREATMENT OF STAPH INFECTIONS. Performed at [facility]    Report Status PENDING  Incomplete  Wound culture     Status: None (Preliminary result)  Collection Time: 08/14/15 10:36 AM  Result Value Ref Range Status   Specimen Description WOUND LEFT THIGH  Final   Special Requests POF VANC AND ZOSYN  SPEC A ON SWAB  Final   Gram Stain PENDING  Incomplete   Culture   Final    NO GROWTH 1 DAY Performed at [facility]    Report Status PENDING  Incomplete  Tissue culture     Status: None (Preliminary result)   Collection Time: 08/14/15 10:42 AM  Result Value Ref Range Status   Specimen Description TISSUE LEFT THIGH  Final   Special Requests POF ON VANC AND ZOSYN  SPEC B  Final   Gram Stain   Final    RARE WBC PRESENT, PREDOMINANTLY PMN NO ORGANISMS SEEN Performed at [facility]    Culture   Final    NO GROWTH 1 DAY Performed at [facility]    Report Status PENDING  Incomplete     Impression/Recommendation  Active Problems:   ESRD on hemodialysis   Acute respiratory failure with hypoxia   Sepsis   SIRS (systemic inflammatory response syndrome)   Diabetes mellitus type 2 in obese   OSA on CPAP   Chronic hypotension   COPD (chronic obstructive pulmonary disease)   Pressure ulcer   [name] is a 54 y.o. 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       [facility] Staphylococcus aureus bacteremia   Staphylococcus aureus bacteremia (SAB) is associated with a high rate of complications and mortality.  Specific aspects of clinical management are critical to optimizing the outcome of patients with SAB.  Therefore, the [facility] Antimicrobial Management Team  [facility]) has initiated an intervention aimed at improving the management of SAB at [facility].  To do so, Infectious Diseases physicians are providing an evidence-based consult for the management of all patients with SAB.     Yes No Comments  Perform follow-up blood cultures (even if the patient is afebrile) to ensure clearance of bacteremia '[x]'  '[]'  Repeat blood cultures taken   Remove vascular catheter and obtain follow-up blood cultures after the removal of the catheter '[]'  '[]'   no central line but she does have new graft material in the endovascular space --discussed with Dr. Doren Custard and the patient has limited vascular options for dialysis   Perform echocardiography to evaluate for endocarditis (transthoracic ECHO is 40-50% sensitive, TEE is > 90% sensitive) '[]'  '[]'  Please keep in mind, that neither test can definitively EXCLUDE endocarditis, and that should clinical suspicion remain high for endocarditis the patient should then still be treated with an "endocarditis" duration of therapy = 6 weeks  No vegetations seen on 2-D echocardiogram. Because this patient already has an endovascular infection with an infected AV fistula that was resected she is already going to get antibodies for 6-8 weeks intravenously. The only way that a transesophageal echocardiogram would change management would be if it would show that she needed cardiothoracic surgery. I doubt that this patient is a good candidate for such surgery. Therefore I would not push necessarily for a transesophageal echocardiogram at this point.   Consult electrophysiologist to evaluate implanted cardiac device (pacemaker, ICD) '[]'  '[]'   not applicable she does not have a pacemaker   Ensure source control '[]'  '[]'  Have all abscesses been drained effectively? Have deep seeded infections (septic joints or osteomyelitis) had appropriate surgical debridement?   Her infected graft was resected   Investigate  for "metastatic" sites of infection '[]'  '[]'  Does the  patient have ANY symptom or physical exam finding that would suggest a deeper infection (back or neck pain that may be suggestive of vertebral osteomyelitis or epidural abscess, muscle pain that could be a symptom of pyomyositis)?  Keep in mind that for deep seeded infections MRI imaging with contrast is preferred rather than other often insensitive tests such as plain x-rays, especially early in a patient's presentation.   She currently has no other symptoms or exam findings to point to another nidus of infection   Change antibiotic therapy to vancomycin given her severe penicillin allergy with angioedema and "throat swelling up " despite Benadryl   Add rifampin once blood cultures are clean given the presence of the new graft material '[]'  '[]'  Beta-lactam antibiotics are preferred for MSSA due to higher cure rates.   If on Vancomycin, goal trough should be 15 - 20 mcg/mL  Estimated duration of IV antibiotic therapy:  6-8 weeks VANCOMYCIN AND RIFAMPIN 300MG PO BID  FOLLOWED BY CHRONIC ORAL ANTIBIOTIC DOXYCYLINE 100MG BID PLUS RIFAMPIN 300MG BID  '[]'  '[]'  Consult case management for probably prolonged outpatient IV antibiotic therapy    #2 Screening: rescreen for HIV and HCV  Dr. Johnnye Sima is back tomorrow.  08/15/2015, 2:40 PM   Thank you so much for this interesting consult  Section for [place] [phone] (pager) [phone] (office) 08/15/2015, 2:40 PM  [place] 08/15/2015, 2:40 PM

## 2015-08-15 NOTE — Progress Notes (Signed)
Spoke with renal.  Accepting SBP of 55 mmHg as long as patient is asymptomatic.  Will transfer to SDU.  Blood pressure measurement is clearly not accurate because patient is up watching TV and talking on the phone.  Rush Farmer, M.D. [facility]. Pager: [phone]. After hours pager: [phone].

## 2015-08-15 NOTE — Evaluation (Signed)
Physical Therapy Evaluation Patient Details Name: [name] MRN: [id number] DOB: Jan 14, 1961 Today's Date: 08/15/2015   History of Present Illness  Pt is a 54 y/o female with a PMH of ESRD on HD since 1992. She was admitted 9/2 with a SA bacteremia felt to be due to infected thigh AVG. Went to OR 9/4 and underwent removal of infected segment and revision of L thigh AVG. Post-op she was hypotensive and on NIPPV.  Clinical Impression  Pt admitted with above diagnosis. Pt currently with functional limitations due to the deficits listed below (see PT Problem List). At the time of PT eval pt was able to perform transfers with close guard and steadying assist for balance. May benefit from an AD next session for balance and energy conservation. Pt will benefit from skilled PT to increase their independence and safety with mobility to allow discharge to the venue listed below.    Of note: Pt on RA throughout session and sats dropped to 84% after transition to chair. BP 59/33 at end of session and pt asymptomatic.      Follow Up Recommendations Home health PT;Supervision for mobility/OOB    Equipment Recommendations  None recommended by PT    Recommendations for Other Services       Precautions / Restrictions Precautions Precautions: Fall Restrictions Weight Bearing Restrictions: No      Mobility  Bed Mobility Overal bed mobility: Needs Assistance Bed Mobility: Supine to Sit     Supine to sit: Min guard     General bed mobility comments: Increased time and use of bed rails required. Min guard for safety as pt scooted to EOB.   Transfers Overall transfer level: Needs assistance Equipment used: 2 person hand held assist Transfers: Sit to/from [facility] Sit to Stand: Min guard Stand pivot transfers: Min assist       General transfer comment: Pt easily transitioned to standing as she is 5'1" and did not have to power-up to standing (almost scooted off the EOB  to standing as feet did not touch floor). Steadying assist only as pt took pivotal steps around to the recliner. VC's for hand placement on seated surface for safety as pt initiated stand>sit.   Ambulation/Gait                Stairs            Wheelchair Mobility    Modified Rankin (Stroke Patients Only)       Balance Overall balance assessment: Needs assistance Sitting-balance support: Feet supported;No upper extremity supported Sitting balance-Leahy Scale: Fair     Standing balance support: Bilateral upper extremity supported Standing balance-Leahy Scale: Poor Standing balance comment: Required UE support to maintain standing balance.                              Pertinent Vitals/Pain Pain Assessment: No/denies pain    Home Living Family/patient expects to be discharged to:: Private residence Living Arrangements: Spouse/significant other;Other relatives (Sister) Available Help at Discharge: Family;Available 24 hours/day Type of Home: House Home Access: Level entry     Home Layout: One level Home Equipment: Walker - 2 wheels;Cane - single point;Shower seat      Prior Function Level of Independence: Independent               Hand Dominance   Dominant Hand: Right    Extremity/Trunk Assessment   Upper Extremity Assessment: Defer to OT evaluation  Lower Extremity Assessment: Generalized weakness      Cervical / Trunk Assessment: Normal  Communication   Communication: No difficulties  Cognition Arousal/Alertness: Awake/alert Behavior During Therapy: WFL for tasks assessed/performed Overall Cognitive Status: Within Functional Limits for tasks assessed                      General Comments      Exercises        Assessment/Plan    PT Assessment Patient needs continued PT services  PT Diagnosis Difficulty walking;Generalized weakness   PT Problem List Decreased strength;Decreased range of  motion;Decreased activity tolerance;Decreased balance;Decreased mobility;Decreased knowledge of use of DME;Decreased safety awareness;Decreased knowledge of precautions;Cardiopulmonary status limiting activity  PT Treatment Interventions DME instruction;Gait training;Stair training;Functional mobility training;Therapeutic activities;Therapeutic exercise;Neuromuscular re-education;Patient/family education   PT Goals (Current goals can be found in the Care Plan section) Acute Rehab PT Goals Patient Stated Goal: Return home PT Goal Formulation: With patient Time For Goal Achievement: 08/22/15 Potential to Achieve Goals: Good    Frequency Min 3X/week   Barriers to discharge        Co-evaluation               End of Session Equipment Utilized During Treatment: Gait belt;Oxygen Activity Tolerance: Patient tolerated treatment well Patient left: in chair;with call bell/phone within reach;with nursing/sitter in room Nurse Communication: Mobility status         Time: 1340-1405 PT Time Calculation (min) (ACUTE ONLY): 25 min   Charges:   PT Evaluation $Initial PT Evaluation Tier I: 1 Procedure PT Treatments $Therapeutic Activity: 8-22 mins   PT G Codes:        Rolinda Roan 10-Sep-2015, 2:23 PM   Rolinda Roan, PT, DPT Acute Rehabilitation Services Pager: [phone]

## 2015-08-15 NOTE — Progress Notes (Signed)
PULMONARY / CRITICAL CARE MEDICINE   Name:[name] [id number] DOB:Jul 11, 1961   ADMISSION DATE: 08/12/2015 CONSULTATION DATE: 9/4  REFERRING MD : Elgergawy  CHIEF COMPLAINT: Hypotension   INITIAL PRESENTATION:  54 y.o. female with a history of ESRD on HD since 1992. Admitted on 9/2 w/ SA bacteremia felt to be due to infected thigh AVG. Went to OR 9/4 underwent removal of infected segment and revision of left thigh AVGF. Post-op she was hypotensive and on NIPPV. She was sent to the ICU for recovery where we were asked to assume care.    STUDIES:    SIGNIFICANT EVENTS: 9/2 admitted w/ working dx of sepsis from infected thigh AVG. abx started.  9/3 seen by vascular.  9/4 to OR as blood cultures were positive and no other clear source other than AVG. Infected are removed and AVG revised. Hypotensive after surgery. PCCM asked to assume care in ICU  SUBJECTIVE:  Comfortable, no new complaints and no events overnight  VITAL SIGNS: Temp: [97.5 F (36.4 C)-98.7 F (37.1 C)] 97.5 F (36.4 C) (09/04 1124) Pulse Rate: [52-89] 77 (09/04 1309) Resp: [4-20] 11 (09/04 1309) BP: (59-138)/(25-109) 72/32 mmHg (09/04 1309) SpO2: [98 %-100 %] 100 % (09/04 1309) FiO2 (%): [60 %] 60 % (09/04 1130) Weight: [123.9 kg (273 lb 2.4 oz)-126.3 kg (278 lb 7.1 oz)] 123.9 kg (273 lb 2.4 oz) (09/04 0425)  HEMODYNAMICS:   VENTILATOR SETTINGS: Vent Mode: [-]  FiO2 (%): [60 %] 60 % INTAKE / OUTPUT:  Intake/Output Summary (Last 24 hours) at 08/14/15 1313 Last data filed at 08/14/15 1055  Gross per 24 hour  Intake  1250 ml  Output  2500 ml  Net -1250 ml    PHYSICAL EXAMINATION: General: Pleasant 54 year old female, resting comfortably in bed  Neuro: Awake, oriented, no focal def  HEENT: NCAT, multiple scars/keloids on neck from old access sites  Cardiovascular: rrr  VASC: right thigh AVG dressing intact  Lungs: Decreased bases   Abdomen: Soft, non-tender  Musculoskeletal: Intact  Skin: Intact   LABS:  CBC  Last Labs      Recent Labs Lab 08/12/15 1748 08/13/15 0238  WBC 6.8 6.0  HGB 12.7 11.4*  HCT 40.8 36.0  PLT 168 155     Coag's  Last Labs      Recent Labs Lab 08/12/15 2250  APTT 36  INR 1.28     BMET  Last Labs      Recent Labs Lab 08/12/15 1748 08/13/15 0238  NA 133* 131*  K 4.3 4.5  CL 96* 96*  CO2 24 25  BUN 28* 30*  CREATININE 6.57* 7.40*  GLUCOSE 122* 139*     Electrolytes  Last Labs      Recent Labs Lab 08/12/15 1748 08/13/15 0238  CALCIUM 9.5 8.7*     Sepsis Markers  Last Labs      Recent Labs Lab 08/12/15 2250 08/13/15 0238  LATICACIDVEN 0.8 0.8  PROCALCITON 3.09 --      ABG  Last Labs     No results for input(s): PHART, PCO2ART, PO2ART in the last 168 hours.   Liver Enzymes  Last Labs      Recent Labs Lab 08/12/15 1748  AST 21  ALT 19  ALKPHOS 166*  BILITOT 1.3*  ALBUMIN 4.0     Cardiac Enzymes  Last Labs     No results for input(s): TROPONINI, PROBNP in the last 168 hours.   Glucose  Last Labs  Recent Labs Lab 08/13/15 1140 08/13/15 1712 08/13/15 2008 08/14/15 0012 08/14/15 0525 08/14/15 0731  GLUCAP 114* 85 97 98 93 80      Imaging Dg Abd 2 Views  08/13/2015 CLINICAL DATA: End-stage renal disease on hemodialysis. Abdominal pain. EXAM: ABDOMEN - 2 VIEW COMPARISON: None. FINDINGS: No dilated loops of large or small bowel. Gas and stool in the rectum. No pathologic calcifications. No organomegaly. No acute osseous abnormality IMPRESSION: No acute abdominal findings Electronically Signed By: Suzy Bouchard M.D. On: 08/13/2015 13:57   ASSESSMENT / PLAN:  PULMONARY OETT A:  Acute hypoxic resp failure ? Hypoventilation vs atx.  Currently on 100% FIO2 but don't think she needs that much  OSA P:  - Wean  FIO2 for sat of 88-92%. - PCXR noted, no acute disease for now. - Pulm hygiene. - CPAP ->auto-titrate while asleep.  CARDIOVASCULAR CVL None PIV  A: Post-op Hypotension. Suspect that this is a mix of residual anesthesia superimposed on underlying chronic hypotension on midodrine at home. Difficult vascular access. Has had occluded bilateral IJ vasc sites, as well as occluded right fem site.  P:  - Titrate neo for SBP goal of 80. - Increase midodrine to 10 mg TID. - KVO IVF.  RENAL A:  ESRD P:  - Per renal  - Check BMET now and in AM. - Replace electrolytes as indicated.  GASTROINTESTINAL A:  Obesity  Aspiration risk  P:  - Renal diet. - PPI.   HEMATOLOGIC A:  Anemia of chronic disease   P:  - Trend CBC  - Transfuse per ICU protocol.  INFECTIOUS A:  Staph bacteremia in setting of infected AVG P:  - BCx2 9/2: staph aureus>>> - Wound culture 9/4>>>NTD - Vanc 9/2>>>  ENDOCRINE A:  DM P:  SSI.   NEUROLOGIC A:  No acute  P:  - Minimize narcs as able. - PT evaluation. - OOB to chair.  FAMILY  - Updates: Patient updated bedside.  The patient is critically ill with multiple organ systems failure and requires high complexity decision making for assessment and support, frequent evaluation and titration of therapies, application of advanced monitoring technologies and extensive interpretation of multiple databases.   Critical Care Time devoted to patient care services described in this note is  35  Minutes. This time reflects time of care of this signee Dr Jennet Maduro. This critical care time does not reflect procedure time, or teaching time or supervisory time of PA/NP/Med student/Med Resident etc but could involve care discussion time.  Rush Farmer, M.D. [facility] Pulmonary/Critical Care Medicine. Pager: [phone]. After hours pager: [phone].

## 2015-08-15 NOTE — Progress Notes (Signed)
Came to assess pt and ask to see if the patient wanted to use her CPAP. Pt said she will use it once she is ready for bed. I ask her to let her RN know when she is ready for bed and I will be gladly to come put her on NIV. Pt is stable at this time and watching tv.

## 2015-08-15 NOTE — Progress Notes (Signed)
ANTIBIOTIC CONSULT NOTE - FOLLOW UP Pharmacy Consult for Vancomycin Indication: Sepsis  Allergies  Allergen Reactions  . Amoxicillin Anaphylaxis and Other (See Comments)    Throat closes and gets sweaty.  . Iohexol Other (See Comments)     Desc: scratchy/itchy throat/itching on back / nausea     Patient Measurements: Height: 5\' 1"  (154.9 cm) Weight: 277 lb 9 oz (125.9 kg) IBW/kg (Calculated) : 47.8  Vital Signs: Temp: 97.6 F (36.4 C) (09/05 0737) Temp Source: Oral (09/05 0737) BP: 107/63 mmHg (09/05 0700) Pulse Rate: 80 (09/05 0700) Intake/Output from previous day: 09/04 0701 - 09/05 0700 In: 2482.6 [P.O.:30; I.V.:2202.6; IV Piggyback:250] Out: -  Intake/Output from this shift:    Labs:  Recent Labs  08/12/15 1748 08/13/15 0238 08/14/15 0832 08/15/15 0236  WBC 6.8 6.0  --  7.7  HGB 12.7 11.4* 12.9 11.3*  PLT 168 155  --  182  CREATININE 6.57* 7.40*  --   --    Estimated Creatinine Clearance: 10.8 mL/min (by C-G formula based on Cr of 7.4). No results for input(s): VANCOTROUGH, VANCOPEAK, VANCORANDOM, GENTTROUGH, GENTPEAK, GENTRANDOM, TOBRATROUGH, TOBRAPEAK, TOBRARND, AMIKACINPEAK, AMIKACINTROU, AMIKACIN in the last 72 hours.   Microbiology: Recent Results (from the past 720 hour(s))  Blood culture (routine x 2)     Status: None (Preliminary result)   Collection Time: 08/12/15  5:36 PM  Result Value Ref Range Status   Specimen Description BLOOD LEFT ARM  Final   Special Requests BOTTLES DRAWN AEROBIC AND ANAEROBIC 5CC  Final   Culture  Setup Time   Final    AEROBIC BOTTLE ONLY GRAM POSITIVE COCCI IN CLUSTERS CRITICAL RESULT CALLED TO, READ BACK BY AND VERIFIED WITH: K.GENGLER,RN 0032 08/14/15 M.CAMPBELL CONFIMRED BY R.GREENE    Culture STAPHYLOCOCCUS AUREUS  Final   Report Status PENDING  Incomplete  Blood culture (routine x 2)     Status: None (Preliminary result)   Collection Time: 08/12/15 10:50 PM  Result Value Ref Range Status   Specimen  Description BLOOD RIGHT ARM  Final   Special Requests BOTTLES DRAWN AEROBIC AND ANAEROBIC 6CC  Final   Culture NO GROWTH 2 DAYS  Final   Report Status PENDING  Incomplete  MRSA PCR Screening     Status: Abnormal   Collection Time: 08/12/15 11:44 PM  Result Value Ref Range Status   MRSA by PCR POSITIVE (A) NEGATIVE Final    Comment:        The GeneXpert MRSA Assay (FDA approved for NASAL specimens only), is one component of a comprehensive MRSA colonization surveillance program. It is not intended to diagnose MRSA infection nor to guide or monitor treatment for MRSA infections. RESULT CALLED TO, READ BACK BY AND VERIFIED WITH: K GENGLER@0219  08/13/15   Wound culture     Status: None (Preliminary result)   Collection Time: 08/13/15 11:09 AM  Result Value Ref Range Status   Specimen Description WOUND LEFT THIGH GRAFT  Final   Special Requests NONE  Final   Gram Stain PENDING  Incomplete   Culture   Final    Culture reincubated for better growth Performed at [facility]    Report Status PENDING  Incomplete  Tissue culture     Status: None (Preliminary result)   Collection Time: 08/14/15 10:42 AM  Result Value Ref Range Status   Specimen Description TISSUE LEFT THIGH  Final   Special Requests POF ON VANC AND ZOSYN  SPEC B  Final   Gram Stain  Final    RARE WBC PRESENT, PREDOMINANTLY PMN NO ORGANISMS SEEN Performed at [facility]    Culture PENDING  Incomplete   Report Status PENDING  Incomplete    Medical History: Past Medical History  Diagnosis Date  . HTN (hypertension)   . Obesities, morbid   . GERD (gastroesophageal reflux disease)   . PE (pulmonary embolism) ~ 2010  . Superior vena cava syndrome     Archie Endo 03/26/2013  . Pneumonia     "now & once a long time ago" (03/26/2013)  . Shortness of breath     "just a little bit; at any time" (03/26/2013)  . OSA on CPAP   . Iron deficiency anemia   . Chronic hypotension   . COPD (chronic  obstructive pulmonary disease)   . ESRD (end stage renal disease) on dialysis     "TTS; [place] [facility]; (03/25/2014)  . Diabetes mellitus type 2 in obese     insulin dependent  . Complication of anesthesia     confusion    Assessment: 54 yo F presents on 9/2 with fever and chills. PMH significant for ESRD and receives HD on TTS. Currently on IV vancomycin for staph aureus bacteremia. Patient is now afebrile up. WBC wnl. Next HD planned for Tuesday.   Vanc 9/2>> Imipenem 9/3>>9/4   9/2 BCx: 1/2 S. aureus 9/4 tissue: NGTD 9/4 wound: NGTD   Goal of Therapy:  Pre-HD Vancomycin level 15-25 mcg/ml  Resolution of infection  Plan:  Continue vancomycin 1g IV QHD-TTS Monitor clinical picture, HD sessions, pre-HD VR at [facility] F/U C&S, abx deescalation / LOT   Albertina Parr, PharmD., BCPS Clinical Pharmacist Pager [phone]

## 2015-08-16 ENCOUNTER — Encounter (HOSPITAL_COMMUNITY): Payer: Self-pay | Admitting: Vascular Surgery

## 2015-08-16 LAB — WOUND CULTURE: Gram Stain: NONE SEEN

## 2015-08-16 LAB — CBC
HCT: 32.6 % — ABNORMAL LOW (ref 36.0–46.0)
Hemoglobin: 9.7 g/dL — ABNORMAL LOW (ref 12.0–15.0)
MCH: 27.9 pg (ref 26.0–34.0)
MCHC: 29.8 g/dL — ABNORMAL LOW (ref 30.0–36.0)
MCV: 93.7 fL (ref 78.0–100.0)
PLATELETS: 159 10*3/uL (ref 150–400)
RBC: 3.48 MIL/uL — AB (ref 3.87–5.11)
RDW: 14.3 % (ref 11.5–15.5)
WBC: 4.7 10*3/uL (ref 4.0–10.5)

## 2015-08-16 LAB — GLUCOSE, CAPILLARY
GLUCOSE-CAPILLARY: 77 mg/dL (ref 65–99)
GLUCOSE-CAPILLARY: 80 mg/dL (ref 65–99)
GLUCOSE-CAPILLARY: 83 mg/dL (ref 65–99)
GLUCOSE-CAPILLARY: 76 mg/dL (ref 65–99)
Glucose-Capillary: 91 mg/dL (ref 65–99)
Glucose-Capillary: 76 mg/dL (ref 65–99)
Glucose-Capillary: 83 mg/dL (ref 65–99)

## 2015-08-16 LAB — SEDIMENTATION RATE: Sed Rate: 61 mm/hr — ABNORMAL HIGH (ref 0–22)

## 2015-08-16 LAB — PHOSPHORUS: PHOSPHORUS: 5.1 mg/dL — AB (ref 2.5–4.6)

## 2015-08-16 LAB — PREALBUMIN: Prealbumin: 11.1 mg/dL — ABNORMAL LOW (ref 18–38)

## 2015-08-16 LAB — VANCOMYCIN, RANDOM: Vancomycin Rm: 22 ug/mL

## 2015-08-16 LAB — HEMOGLOBIN A1C
Hgb A1c MFr Bld: 6.6 % — ABNORMAL HIGH (ref 4.8–5.6)
MEAN PLASMA GLUCOSE: 143 mg/dL

## 2015-08-16 LAB — C-REACTIVE PROTEIN: CRP: 11.9 mg/dL — ABNORMAL HIGH (ref <1.0)

## 2015-08-16 LAB — HIV ANTIBODY (ROUTINE TESTING W REFLEX): HIV SCREEN 4TH GENERATION: NONREACTIVE

## 2015-08-16 LAB — TSH: TSH: 1.559 u[IU]/mL (ref 0.350–4.500)

## 2015-08-16 LAB — CORTISOL: Cortisol, Plasma: 10.9 ug/dL

## 2015-08-16 LAB — MAGNESIUM: MAGNESIUM: 1.8 mg/dL (ref 1.7–2.4)

## 2015-08-16 MED ORDER — COSYNTROPIN 0.25 MG IJ SOLR
0.2500 mg | Freq: Once | INTRAMUSCULAR | Status: AC
  Administered 2015-08-17: 0.25 mg via INTRAVENOUS
  Filled 2015-08-16: qty 0.25

## 2015-08-16 MED ORDER — PENTAFLUOROPROP-TETRAFLUOROETH EX AERO
INHALATION_SPRAY | CUTANEOUS | Status: AC
  Administered 2015-08-16: 1 via TOPICAL
  Filled 2015-08-16: qty 103.5

## 2015-08-16 MED ORDER — DOXERCALCIFEROL 4 MCG/2ML IV SOLN
INTRAVENOUS | Status: AC
  Filled 2015-08-16: qty 2

## 2015-08-16 MED ORDER — NEPRO/CARBSTEADY PO LIQD
237.0000 mL | Freq: Three times a day (TID) | ORAL | Status: DC
  Administered 2015-08-16: 237 mL via ORAL
  Filled 2015-08-16 (×10): qty 237

## 2015-08-16 NOTE — Care Management Note (Signed)
Case Management Note  Patient Details  Name: [name] MRN: [id number] Date of Birth: [birth year]  Subjective/Objective:     Pt admitted with sepsis/infected graft               Action/Plan: PTA pt lived at home-- hx HD- plan for 6 wks of Vanc with HD per ID note.   Expected Discharge Date:                  Expected Discharge Plan:  Home/Self Care  In-House Referral:     Discharge planning Services  CM Consult  Post Acute Care Choice:    Choice offered to:     DME Arranged:    DME Agency:     HH Arranged:    HH Agency:     Status of Service:  In process, will continue to follow  Medicare Important Message Given:    Date Medicare IM Given:    Medicare IM give by:    Date Additional Medicare IM Given:    Additional Medicare Important Message give by:     If discussed at [place] of Stay Meetings, dates discussed:    Additional Comments:  Dawayne Patricia, RN 08/16/2015, 2:34 PM

## 2015-08-16 NOTE — Progress Notes (Signed)
PT Cancellation Note  Patient Details Name: [name] MRN: [id number] DOB: 26-Jul-1961   Cancelled Treatment:    Reason Eval/Treat Not Completed: Patient at procedure or test/unavailable.  Pt gone for HD this afternoon when arrived.  Will see 9/7 as able. 08/16/2015  Donnella Sham, [place] [phone]  (pager)   [name], Tessie Fass 08/16/2015, 3:37 PM

## 2015-08-16 NOTE — Progress Notes (Signed)
[facility] Progress Note   Subjective: feeling better, afebrile  Filed Vitals:   08/16/15 0320 08/16/15 0800 08/16/15 1000 08/16/15 1159  BP: 64/37 84/44 85/50  81/53  Pulse: 78 72  75  Temp: 97.9 F (36.6 C) 97.5 F (36.4 C)  97.6 F (36.4 C)  TempSrc: Oral Oral  Oral  Resp: 13 12    Height:      Weight:      SpO2: 91% 97%  98%   Exam: Alert, no distress No jvd Chest clear bilat RRR no MRG Abd soft ntnd obese L thigh AVG +bruit, clean wounds Neuro is nf, Ox 3  TTS [place]  4h 30min   2/2.5 bath   122.5kg   Heparin 3000/1000 mid Rx   L thigh AVG Hect 2ug Last pth 201, P ok Ca 10 Hb 11.7      Assessment: 1 AVG infection, sp revision 2 Staph sepsis 3 Hypotension, chronic issue on midodrine 4 ESRD 5 Anemia stable 6 Hx PE 7 DM  Plan - HD today, use AVG medially away from surgical site. OK for dc after HD if BP's stable (>70).     Kelly Splinter MD  pager [phone]    cell [phone]  08/16/2015, 12:26 PM     Recent Labs Lab 08/13/15 0238 08/14/15 0832 08/15/15 1300 08/15/15 2052 08/16/15 0332  NA 131* 138 134* 132*  --   K 4.5 4.1 4.1 4.2  --   CL 96*  --  98* 94*  --   CO2 25  --  25 26  --   GLUCOSE 139* 93 88 94  --   BUN 30*  --  28* 29*  --   CREATININE 7.40*  --  7.42* 7.97*  --   CALCIUM 8.7*  --  7.5* 7.8*  --   PHOS  --   --  4.8* 5.2* 5.1*    Recent Labs Lab 08/12/15 1748 08/15/15 2052  AST 21  --   ALT 19  --   ALKPHOS 166*  --   BILITOT 1.3*  --   PROT 8.5*  --   ALBUMIN 4.0 3.5    Recent Labs Lab 08/13/15 0238 08/14/15 0832 08/15/15 0236 08/16/15 0332  WBC 6.0  --  7.7 4.7  HGB 11.4* 12.9 11.3* 9.7*  HCT 36.0 38.0 38.1 32.6*  MCV 92.3  --  95.3 93.7  PLT 155  --  182 159   . antiseptic oral rinse  7 mL Mouth Rinse BID  . calcium acetate  1,334 mg Oral TID WC  . calcium carbonate  1,000 mg Oral QAC supper  . Chlorhexidine Gluconate Cloth  6 each Topical Q0600  . clonazePAM  1 mg Oral QHS  . [START ON  08/17/2015] cosyntropin  0.25 mg Intravenous Once  . doxercalciferol  2 mcg Intravenous Q T,Th,Sa-HD  . heparin  3,000 Units Dialysis Once in dialysis  . heparin  4,000 Units Dialysis Once in dialysis  . heparin  5,000 Units Subcutaneous 3 times per day  . insulin aspart  0-9 Units Subcutaneous TID WC  . midodrine  10 mg Oral TID WC  . multivitamin  1 tablet Oral QHS  . mupirocin ointment  1 application Nasal BID  . sodium chloride  3 mL Intravenous Q12H  . sodium chloride  3 mL Intravenous Q12H  . vancomycin  1,000 mg Intravenous Q T,Th,Sa-HD   . phenylephrine (NEO-SYNEPHRINE) Adult infusion Stopped (08/15/15 1159)   sodium chloride, sodium chloride,  sodium chloride, sodium chloride, sodium chloride, acetaminophen **OR** acetaminophen, alteplase, heparin, ipratropium-albuterol, lidocaine (PF), lidocaine-prilocaine, ondansetron **OR** ondansetron (ZOFRAN) IV, oxyCODONE, pentafluoroprop-tetrafluoroeth, sodium chloride

## 2015-08-16 NOTE — Progress Notes (Signed)
[facility] PROGRESS NOTE  [name] [id number] DOB: 1961-07-11 DOA: 08/12/2015 PCP: Barbette Merino, MD  Assessment/Plan: 54 y/o female with PMH of  Obesity, COPD, DM, OSA, Chronic Hypotension, ESRD on HD, Difficult Vascular Access is admitted on 9/2 with Staph Bacteremia due to AVG infection. Underwent removal of infected segment and revision of left thigh AVGF. Post-op she was hypotensive admitted to ICU for recovery. Patient still has asymptomatic hypotension, Tfred to SDU for hospitlaist service   1. Septic shock due to Staph bacteremia. Patient is still hypotensive. Manual SBP  At 79 mmHg, but completely asymptomatic. Patient reports chronic hypotension.  -we will cont IV atx. Monitor closely IVF/pressor support as needed. Check adrenal function AM cortisol, acth stim test tomorrow    2.  Staph bacteremia due to AVG infection. S/p removal of infected segment and revision of left thigh AVGF -blood cultures (9/2): +staph. Wound cultures+staph. Repeat blood cultures (9/4): NGTD. Per ID: planned IV atx for 6-8 weeks  3. Chronic hypotension. Worse with sepsis. Asymptomatic hypotension. We will treat infection, sepsis. check adrenal function, tsh 4. ESRD on HD. Per nephrology  5. Acute hypoxic resp failure ? Hypoventilation post op. Currently stable. H/o COPD. No wheezing on exam. Cont prn bronchodilators  6. DM episodes of mild hypoglycemia. Hold lantus. Cont ISS for now. Check ha1c   Code Status: full Family Communication:  D/w patient, RN (indicate person spoken with, relationship, and if by phone, the number) Disposition Plan: pend clinical improvement    Consultants:  Nephrology   PCCM  Vascular   Procedures: 9/2 admitted w/ working dx of sepsis from infected thigh AVG. abx started.  9/3 seen by vascular.  9/4 to OR as blood cultures were positive and no other clear source other than AVG. Infected are removed and AVG revised. Hypotensive after surgery. PCCM asked  to assume care in ICU  Antibiotics:  vanc 9/2>>>>   (indicate start date, and stop date if known)  HPI/Subjective: Alert. No pains   Objective: Filed Vitals:   08/16/15 0320  BP: 64/37  Pulse: 78  Temp: 97.9 F (36.6 C)  Resp: 13    Intake/Output Summary (Last 24 hours) at 08/16/15 0821 Last data filed at 08/16/15 0400  Gross per 24 hour  Intake 626.75 ml  Output      0 ml  Net 626.75 ml   Filed Weights   08/14/15 0425 08/14/15 1358 08/15/15 0357  Weight: 123.9 kg (273 lb 2.4 oz) 124.7 kg (274 lb 14.6 oz) 125.9 kg (277 lb 9 oz)    Exam:   General:  No distress   Cardiovascular: [id number] mild systolic mr rrr  Respiratory: few crackles in LL  Abdomen: soft, obese, nt  Musculoskeletal: mild edema   Data Reviewed: Basic Metabolic Panel:  Recent Labs Lab 08/12/15 1748 08/13/15 0238 08/14/15 0832 08/15/15 1300 08/15/15 2052 08/16/15 0332  NA 133* 131* 138 134* 132*  --   K 4.3 4.5 4.1 4.1 4.2  --   CL 96* 96*  --  98* 94*  --   CO2 24 25  --  25 26  --   GLUCOSE 122* 139* 93 88 94  --   BUN 28* 30*  --  28* 29*  --   CREATININE 6.57* 7.40*  --  7.42* 7.97*  --   CALCIUM 9.5 8.7*  --  7.5* 7.8*  --   MG  --   --   --  1.7  --  1.8  PHOS  --   --   --  4.8* 5.2* 5.1*   Liver Function Tests:  Recent Labs Lab 08/12/15 1748 08/15/15 2052  AST 21  --   ALT 19  --   ALKPHOS 166*  --   BILITOT 1.3*  --   PROT 8.5*  --   ALBUMIN 4.0 3.5    Recent Labs Lab 08/13/15 1036  LIPASE 33   No results for input(s): AMMONIA in the last 168 hours. CBC:  Recent Labs Lab 08/12/15 1748 08/13/15 0238 08/14/15 0832 08/15/15 0236 08/16/15 0332  WBC 6.8 6.0  --  7.7 4.7  HGB 12.7 11.4* 12.9 11.3* 9.7*  HCT 40.8 36.0 38.0 38.1 32.6*  MCV 92.5 92.3  --  95.3 93.7  PLT 168 155  --  182 159   Cardiac Enzymes: No results for input(s): CKTOTAL, CKMB, CKMBINDEX, TROPONINI in the last 168 hours. BNP (last 3 results) No results for input(s): BNP in the  last 8760 hours.  ProBNP (last 3 results) No results for input(s): PROBNP in the last 8760 hours.  CBG:  Recent Labs Lab 08/15/15 1133 08/15/15 1526 08/15/15 2129 08/15/15 2318 08/16/15 0322  GLUCAP 102* 103* 86 94 77    Recent Results (from the past 240 hour(s))  Blood culture (routine x 2)     Status: None   Collection Time: 08/12/15  5:36 PM  Result Value Ref Range Status   Specimen Description BLOOD LEFT ARM  Final   Special Requests BOTTLES DRAWN AEROBIC AND ANAEROBIC 5CC  Final   Culture  Setup Time   Final    AEROBIC BOTTLE ONLY GRAM POSITIVE COCCI IN CLUSTERS CRITICAL RESULT CALLED TO, READ BACK BY AND VERIFIED WITH: K.GENGLER,RN 0032 08/14/15 M.[place] BY R.GREENE    Culture STAPHYLOCOCCUS AUREUS  Final   Report Status 08/15/2015 FINAL  Final   Organism ID, Bacteria STAPHYLOCOCCUS AUREUS  Final      Susceptibility   Staphylococcus aureus - MIC*    CIPROFLOXACIN >=8 RESISTANT Resistant     ERYTHROMYCIN >=8 RESISTANT Resistant     GENTAMICIN <=0.5 SENSITIVE Sensitive     OXACILLIN 0.5 SENSITIVE Sensitive     TETRACYCLINE <=1 SENSITIVE Sensitive     VANCOMYCIN 1 SENSITIVE Sensitive     TRIMETH/SULFA <=10 SENSITIVE Sensitive     CLINDAMYCIN <=0.25 SENSITIVE Sensitive     RIFAMPIN <=0.5 SENSITIVE Sensitive     Inducible Clindamycin NEGATIVE Sensitive     * STAPHYLOCOCCUS AUREUS  Blood culture (routine x 2)     Status: None (Preliminary result)   Collection Time: 08/12/15 10:50 PM  Result Value Ref Range Status   Specimen Description BLOOD RIGHT ARM  Final   Special Requests BOTTLES DRAWN AEROBIC AND ANAEROBIC 6CC  Final   Culture NO GROWTH 3 DAYS  Final   Report Status PENDING  Incomplete  MRSA PCR Screening     Status: Abnormal   Collection Time: 08/12/15 11:44 PM  Result Value Ref Range Status   MRSA by PCR POSITIVE (A) NEGATIVE Final    Comment:        The GeneXpert MRSA Assay (FDA approved for NASAL specimens only), is one component of  a comprehensive MRSA colonization surveillance program. It is not intended to diagnose MRSA infection nor to guide or monitor treatment for MRSA infections. RESULT CALLED TO, READ BACK BY AND VERIFIED WITH: K GENGLER@0219  08/13/15   Wound culture     Status: None   Collection Time: 08/13/15 11:09 AM  Result Value Ref Range Status   Specimen Description  WOUND LEFT THIGH GRAFT  Final   Special Requests NONE  Final   Gram Stain   Final    NO WBC SEEN NO SQUAMOUS EPITHELIAL CELLS SEEN RARE GRAM POSITIVE COCCI IN PAIRS Performed at [facility]    Culture   Final    ABUNDANT STAPHYLOCOCCUS AUREUS Note: RIFAMPIN AND GENTAMICIN SHOULD NOT BE USED AS SINGLE DRUGS FOR TREATMENT OF STAPH INFECTIONS. This organism DOES NOT demonstrate inducible Clindamycin resistance in vitro. Performed at [facility]    Report Status 08/16/2015 FINAL  Final   Organism ID, Bacteria STAPHYLOCOCCUS AUREUS  Final      Susceptibility   Staphylococcus aureus - MIC*    CLINDAMYCIN <=0.25 SENSITIVE Sensitive     ERYTHROMYCIN >=8 RESISTANT Resistant     GENTAMICIN <=0.5 SENSITIVE Sensitive     LEVOFLOXACIN 4 INTERMEDIATE Intermediate     OXACILLIN 0.5 SENSITIVE Sensitive     RIFAMPIN <=0.5 SENSITIVE Sensitive     TRIMETH/SULFA <=10 SENSITIVE Sensitive     VANCOMYCIN 1 SENSITIVE Sensitive     TETRACYCLINE <=1 SENSITIVE Sensitive     MOXIFLOXACIN 1 INTERMEDIATE Intermediate     * ABUNDANT STAPHYLOCOCCUS AUREUS  Wound culture     Status: None (Preliminary result)   Collection Time: 08/14/15 10:36 AM  Result Value Ref Range Status   Specimen Description WOUND LEFT THIGH  Final   Special Requests POF VANC AND ZOSYN  SPEC A ON SWAB  Final   Gram Stain   Final    NO WBC SEEN NO SQUAMOUS EPITHELIAL CELLS SEEN NO ORGANISMS SEEN Performed at [facility]    Culture   Final    NO GROWTH 1 DAY Performed at [facility]    Report Status PENDING  Incomplete  Tissue culture      Status: None (Preliminary result)   Collection Time: 08/14/15 10:42 AM  Result Value Ref Range Status   Specimen Description TISSUE LEFT THIGH  Final   Special Requests POF ON VANC AND ZOSYN  SPEC B  Final   Gram Stain   Final    RARE WBC PRESENT, PREDOMINANTLY PMN NO ORGANISMS SEEN Performed at [facility]    Culture   Final    NO GROWTH 1 DAY Performed at [facility]    Report Status PENDING  Incomplete  Culture, blood (routine x 2)     Status: None (Preliminary result)   Collection Time: 08/14/15  8:00 PM  Result Value Ref Range Status   Specimen Description BLOOD HEMODIALYSIS CATHETER  Final   Special Requests BOTTLES DRAWN AEROBIC AND ANAEROBIC 10ML  Final   Culture NO GROWTH < 24 HOURS  Final   Report Status PENDING  Incomplete  Culture, blood (routine x 2)     Status: None (Preliminary result)   Collection Time: 08/14/15  9:00 PM  Result Value Ref Range Status   Specimen Description BLOOD LEFT FEMORAL ARTERY HEMODIALYSIS CATHETER  Final   Special Requests BOTTLES DRAWN AEROBIC AND ANAEROBIC 10CC  Final   Culture NO GROWTH < 24 HOURS  Final   Report Status PENDING  Incomplete     Studies: Dg Chest Port 1 View  08/15/2015   CLINICAL DATA:  54 year old female with hypoxia. Initial encounter.  EXAM: PORTABLE CHEST - 1 VIEW  COMPARISON:  08/12/2015 and earlier.  FINDINGS: Portable AP semi upright view at 0819 hours. Stable lung volumes. Stable cardiac size and mediastinal contours. No pneumothorax. No pleural effusion or consolidation. Stable  pulmonary vascularity without overt edema. Visualized tracheal air column is within normal limits.  IMPRESSION: Stable pulmonary vascular congestion without overt edema.   Electronically Signed   By: Genevie Ann M.D.   On: 08/15/2015 08:48    Scheduled Meds: . antiseptic oral rinse  7 mL Mouth Rinse BID  . calcium acetate  1,334 mg Oral TID WC  . calcium carbonate  1,000 mg Oral QAC supper  . Chlorhexidine Gluconate Cloth   6 each Topical Q0600  . clonazePAM  1 mg Oral QHS  . doxercalciferol  2 mcg Intravenous Q T,Th,Sa-HD  . heparin  3,000 Units Dialysis Once in dialysis  . heparin  4,000 Units Dialysis Once in dialysis  . heparin  5,000 Units Subcutaneous 3 times per day  . insulin aspart  0-9 Units Subcutaneous TID WC  . insulin glargine  15 Units Subcutaneous QHS  . midodrine  10 mg Oral TID WC  . multivitamin  1 tablet Oral QHS  . mupirocin ointment  1 application Nasal BID  . sodium chloride  3 mL Intravenous Q12H  . sodium chloride  3 mL Intravenous Q12H  . vancomycin  1,000 mg Intravenous Q T,Th,Sa-HD   Continuous Infusions: . phenylephrine (NEO-SYNEPHRINE) Adult infusion Stopped (08/15/15 1159)    Active Problems:   ESRD on hemodialysis   Acute respiratory failure with hypoxia   Sepsis   SIRS (systemic inflammatory response syndrome)   Diabetes mellitus type 2 in obese   OSA on CPAP   Chronic hypotension   COPD (chronic obstructive pulmonary disease)   Pressure ulcer   Infection and inflammatory reaction due to cardiac device, implant, and graft   Staphylococcus aureus bacteremia with sepsis   Severe sepsis with septic shock   Screen for STD (sexually transmitted disease)    Time spent: >35 minutes     Kinnie Feil  [facility] Pager [phone]. If 7PM-7AM, please contact night-coverage at [email or web address], password [facility] 08/16/2015, 8:21 AM  LOS: 4 days

## 2015-08-16 NOTE — Progress Notes (Addendum)
[facility]  Subjective  - Doing well over all   Objective 64/37 78 97.9 F (36.6 C) (Oral) 13 91%  Intake/Output Summary (Last 24 hours) at 08/16/15 0729 Last data filed at 08/16/15 0400  Gross per 24 hour  Intake 726.75 ml  Output      0 ml  Net 726.75 ml    Left thigh graft revision healing well Incisions clean and dry lateral thigh Distal left foot well perfused, active range of motion intact  Assessment/Planning: POD # 2 left thigh graft revision You may use medial half of thigh graft got HD.  Do not stick lateral half of graft for 4 weeks.  F/U in office PRN  Laurence Slate [facility] 08/16/2015 7:29 AM --  Laboratory Lab Results:  Recent Labs  08/15/15 0236 08/16/15 0332  WBC 7.7 4.7  HGB 11.3* 9.7*  HCT 38.1 32.6*  PLT 182 159   BMET  Recent Labs  08/15/15 1300 08/15/15 2052  NA 134* 132*  K 4.1 4.2  CL 98* 94*  CO2 25 26  GLUCOSE 88 94  BUN 28* 29*  CREATININE 7.42* 7.97*  CALCIUM 7.5* 7.8*    COAG Lab Results  Component Value Date   INR 1.28 08/12/2015   INR 1.10 03/26/2013   INR 1.05 05/30/2011   No results found for: PTT  Agree with above.   Deitra Mayo, MD, [place] [phone] Office: [phone]

## 2015-08-16 NOTE — Progress Notes (Signed)
Midodrine not in pt drawer.  Missing dose notice sent to pharmacy.

## 2015-08-16 NOTE — Progress Notes (Signed)
INFECTIOUS DISEASE PROGRESS NOTE  ID: [name] is a 54 y.o. female with  Active Problems:   ESRD on hemodialysis   Acute respiratory failure with hypoxia   Sepsis   SIRS (systemic inflammatory response syndrome)   Diabetes mellitus type 2 in obese   OSA on CPAP   Chronic hypotension   COPD (chronic obstructive pulmonary disease)   Pressure ulcer   Infection and inflammatory reaction due to cardiac device, implant, and graft   Staphylococcus aureus bacteremia with sepsis   Severe sepsis with septic shock   Screen for STD (sexually transmitted disease)  Subjective: Without complaints  Abtx:  Anti-infectives    Start     Dose/Rate Route Frequency Ordered Stop   08/13/15 1200  vancomycin (VANCOCIN) IVPB 1000 mg/200 mL premix     1,000 mg 200 mL/hr over 60 Minutes Intravenous Every T-Th-Sa (Hemodialysis) 08/12/15 2158     08/13/15 1000  imipenem-cilastatin (PRIMAXIN) 250 mg in sodium chloride 0.9 % 100 mL IVPB  Status:  Discontinued     250 mg 200 mL/hr over 30 Minutes Intravenous Every 12 hours 08/12/15 2158 08/14/15 1034   08/12/15 1938  imipenem-cilastatin (PRIMAXIN) 500 mg in sodium chloride 0.9 % 100 mL IVPB     500 mg 200 mL/hr over 30 Minutes Intravenous  Once 08/12/15 1938 08/12/15 2152   08/12/15 1900  vancomycin (VANCOCIN) 2,000 mg in sodium chloride 0.9 % 500 mL IVPB     2,000 mg 250 mL/hr over 120 Minutes Intravenous  Once 08/12/15 1852 08/12/15 2146   08/12/15 1852  piperacillin-tazobactam (ZOSYN) IVPB 2.25 g  Status:  Discontinued     2.25 g 100 mL/hr over 30 Minutes Intravenous  Once 08/12/15 1852 08/12/15 2142      Medications:  Scheduled: . antiseptic oral rinse  7 mL Mouth Rinse BID  . calcium acetate  1,334 mg Oral TID WC  . calcium carbonate  1,000 mg Oral QAC supper  . Chlorhexidine Gluconate Cloth  6 each Topical Q0600  . clonazePAM  1 mg Oral QHS  . [START ON 08/17/2015] cosyntropin  0.25 mg Intravenous Once  . doxercalciferol  2 mcg  Intravenous Q T,Th,Sa-HD  . heparin  3,000 Units Dialysis Once in dialysis  . heparin  4,000 Units Dialysis Once in dialysis  . heparin  5,000 Units Subcutaneous 3 times per day  . insulin aspart  0-9 Units Subcutaneous TID WC  . midodrine  10 mg Oral TID WC  . multivitamin  1 tablet Oral QHS  . mupirocin ointment  1 application Nasal BID  . sodium chloride  3 mL Intravenous Q12H  . sodium chloride  3 mL Intravenous Q12H  . vancomycin  1,000 mg Intravenous Q T,Th,Sa-HD    Objective: Vital signs in last 24 hours: Temp:  [97.4 F (36.3 C)-97.9 F (36.6 C)] 97.5 F (36.4 C) (09/06 0800) Pulse Rate:  [58-120] 72 (09/06 0800) Resp:  [10-30] 12 (09/06 0800) BP: (52-94)/(33-63) 84/44 mmHg (09/06 0800) SpO2:  [84 %-100 %] 97 % (09/06 0800)   General appearance: alert, cooperative, no distress and morbidly obese Resp: clear to auscultation bilaterally Cardio: regular rate and rhythm GI: normal findings: bowel sounds normal and soft, non-tender Extremities: L thigh wound clean, minimal bruit.   Lab Results  Recent Labs  08/15/15 0236 08/15/15 1300 08/15/15 2052 08/16/15 0332  WBC 7.7  --   --  4.7  HGB 11.3*  --   --  9.7*  HCT 38.1  --   --  32.6*  NA  --  134* 132*  --   K  --  4.1 4.2  --   CL  --  98* 94*  --   CO2  --  25 26  --   BUN  --  28* 29*  --   CREATININE  --  7.42* 7.97*  --    Liver Panel  Recent Labs  08/15/15 2052  ALBUMIN 3.5   Sedimentation Rate  Recent Labs  08/16/15 0332  ESRSEDRATE 61*   C-Reactive Protein  Recent Labs  08/16/15 0332  CRP 11.9*    Microbiology: Recent Results (from the past 240 hour(s))  Blood culture (routine x 2)     Status: None   Collection Time: 08/12/15  5:36 PM  Result Value Ref Range Status   Specimen Description BLOOD LEFT ARM  Final   Special Requests BOTTLES DRAWN AEROBIC AND ANAEROBIC 5CC  Final   Culture  Setup Time   Final    AEROBIC BOTTLE ONLY GRAM POSITIVE COCCI IN CLUSTERS CRITICAL RESULT  CALLED TO, READ BACK BY AND VERIFIED WITH: K.GENGLER,RN 0032 08/14/15 M.[place] BY R.GREENE    Culture STAPHYLOCOCCUS AUREUS  Final   Report Status 08/15/2015 FINAL  Final   Organism ID, Bacteria STAPHYLOCOCCUS AUREUS  Final      Susceptibility   Staphylococcus aureus - MIC*    CIPROFLOXACIN >=8 RESISTANT Resistant     ERYTHROMYCIN >=8 RESISTANT Resistant     GENTAMICIN <=0.5 SENSITIVE Sensitive     OXACILLIN 0.5 SENSITIVE Sensitive     TETRACYCLINE <=1 SENSITIVE Sensitive     VANCOMYCIN 1 SENSITIVE Sensitive     TRIMETH/SULFA <=10 SENSITIVE Sensitive     CLINDAMYCIN <=0.25 SENSITIVE Sensitive     RIFAMPIN <=0.5 SENSITIVE Sensitive     Inducible Clindamycin NEGATIVE Sensitive     * STAPHYLOCOCCUS AUREUS  Blood culture (routine x 2)     Status: None (Preliminary result)   Collection Time: 08/12/15 10:50 PM  Result Value Ref Range Status   Specimen Description BLOOD RIGHT ARM  Final   Special Requests BOTTLES DRAWN AEROBIC AND ANAEROBIC 6CC  Final   Culture NO GROWTH 3 DAYS  Final   Report Status PENDING  Incomplete  MRSA PCR Screening     Status: Abnormal   Collection Time: 08/12/15 11:44 PM  Result Value Ref Range Status   MRSA by PCR POSITIVE (A) NEGATIVE Final    Comment:        The GeneXpert MRSA Assay (FDA approved for NASAL specimens only), is one component of a comprehensive MRSA colonization surveillance program. It is not intended to diagnose MRSA infection nor to guide or monitor treatment for MRSA infections. RESULT CALLED TO, READ BACK BY AND VERIFIED WITH: K GENGLER@0219  08/13/15   Wound culture     Status: None   Collection Time: 08/13/15 11:09 AM  Result Value Ref Range Status   Specimen Description WOUND LEFT THIGH GRAFT  Final   Special Requests NONE  Final   Gram Stain   Final    NO WBC SEEN NO SQUAMOUS EPITHELIAL CELLS SEEN RARE GRAM POSITIVE COCCI IN PAIRS Performed at [facility]    Culture   Final    ABUNDANT  STAPHYLOCOCCUS AUREUS Note: RIFAMPIN AND GENTAMICIN SHOULD NOT BE USED AS SINGLE DRUGS FOR TREATMENT OF STAPH INFECTIONS. This organism DOES NOT demonstrate inducible Clindamycin resistance in vitro. Performed at [facility]    Report Status 08/16/2015 FINAL  Final  Organism ID, Bacteria STAPHYLOCOCCUS AUREUS  Final      Susceptibility   Staphylococcus aureus - MIC*    CLINDAMYCIN <=0.25 SENSITIVE Sensitive     ERYTHROMYCIN >=8 RESISTANT Resistant     GENTAMICIN <=0.5 SENSITIVE Sensitive     LEVOFLOXACIN 4 INTERMEDIATE Intermediate     OXACILLIN 0.5 SENSITIVE Sensitive     RIFAMPIN <=0.5 SENSITIVE Sensitive     TRIMETH/SULFA <=10 SENSITIVE Sensitive     VANCOMYCIN 1 SENSITIVE Sensitive     TETRACYCLINE <=1 SENSITIVE Sensitive     MOXIFLOXACIN 1 INTERMEDIATE Intermediate     * ABUNDANT STAPHYLOCOCCUS AUREUS  Wound culture     Status: None (Preliminary result)   Collection Time: 08/14/15 10:36 AM  Result Value Ref Range Status   Specimen Description WOUND LEFT THIGH  Final   Special Requests POF VANC AND ZOSYN  SPEC A ON SWAB  Final   Gram Stain   Final    NO WBC SEEN NO SQUAMOUS EPITHELIAL CELLS SEEN NO ORGANISMS SEEN Performed at [facility]    Culture   Final    NO GROWTH 1 DAY Performed at [facility]    Report Status PENDING  Incomplete  Tissue culture     Status: None (Preliminary result)   Collection Time: 08/14/15 10:42 AM  Result Value Ref Range Status   Specimen Description TISSUE LEFT THIGH  Final   Special Requests POF ON VANC AND ZOSYN  SPEC B  Final   Gram Stain   Final    RARE WBC PRESENT, PREDOMINANTLY PMN NO ORGANISMS SEEN Performed at [facility]   Final    Culture reincubated for better growth Performed at [facility]    Report Status PENDING  Incomplete  Culture, blood (routine x 2)     Status: None (Preliminary result)   Collection Time: 08/14/15  8:00 PM  Result Value Ref Range Status    Specimen Description BLOOD HEMODIALYSIS CATHETER  Final   Special Requests BOTTLES DRAWN AEROBIC AND ANAEROBIC 10ML  Final   Culture NO GROWTH < 24 HOURS  Final   Report Status PENDING  Incomplete  Culture, blood (routine x 2)     Status: None (Preliminary result)   Collection Time: 08/14/15  9:00 PM  Result Value Ref Range Status   Specimen Description BLOOD LEFT FEMORAL ARTERY HEMODIALYSIS CATHETER  Final   Special Requests BOTTLES DRAWN AEROBIC AND ANAEROBIC 10CC  Final   Culture NO GROWTH < 24 HOURS  Final   Report Status PENDING  Incomplete    Studies/Results: Dg Chest Port 1 View  08/15/2015   CLINICAL DATA:  54 year old female with hypoxia. Initial encounter.  EXAM: PORTABLE CHEST - 1 VIEW  COMPARISON:  08/12/2015 and earlier.  FINDINGS: Portable AP semi upright view at 0819 hours. Stable lung volumes. Stable cardiac size and mediastinal contours. No pneumothorax. No pleural effusion or consolidation. Stable pulmonary vascularity without overt edema. Visualized tracheal air column is within normal limits.  IMPRESSION: Stable pulmonary vascular congestion without overt edema.   Electronically Signed   By: Genevie Ann M.D.   On: 08/15/2015 08:48     Assessment/Plan: MSSA bacteremia/sepsis  Improving- off pressors  Continue vanco, due to PEN allergy (severe)  Await repeat BCx  Plan for 6 weeks of vanco with HD  HD graft infection  Debrided 9-4   Appears to be healing well  ESRD  To HD today.   Total days of antibiotics: 5  [facility] (pager) [phone] [email or web address] 08/16/2015, 10:23 AM  LOS: 4 days

## 2015-08-16 NOTE — Progress Notes (Signed)
ANTIBIOTIC CONSULT NOTE - FOLLOW UP  Pharmacy Consult for Vancomycin Indication: Staph aureus bacteremia  Allergies  Allergen Reactions  . Amoxicillin Anaphylaxis and Other (See Comments)    Throat closes and gets sweaty.  . Iohexol Other (See Comments)     Desc: scratchy/itchy throat/itching on back / nausea     Patient Measurements: Height: 5\' 1"  (154.9 cm) Weight: 281 lb 12 oz (127.8 kg) IBW/kg (Calculated) : 47.8 Adjusted Body Weight:    Vital Signs: Temp: 97.6 F (36.4 C) (09/06 1159) Temp Source: Oral (09/06 1159) BP: 82/34 mmHg (09/06 1511) Pulse Rate: 70 (09/06 1511) Intake/Output from previous day: 09/05 0701 - 09/06 0700 In: 726.8 [P.O.:120; I.V.:606.8] Out: -  Intake/Output from this shift:    Labs:  Recent Labs  08/14/15 0832 08/15/15 0236 08/15/15 1300 08/15/15 2052 08/16/15 0332  WBC  --  7.7  --   --  4.7  HGB 12.9 11.3*  --   --  9.7*  PLT  --  182  --   --  159  CREATININE  --   --  7.42* 7.97*  --    Estimated Creatinine Clearance: 10.2 mL/min (by C-G formula based on Cr of 7.97).  Recent Labs  08/16/15 1453  VANCORANDOM 22     Microbiology: Recent Results (from the past 720 hour(s))  Blood culture (routine x 2)     Status: None   Collection Time: 08/12/15  5:36 PM  Result Value Ref Range Status   Specimen Description BLOOD LEFT ARM  Final   Special Requests BOTTLES DRAWN AEROBIC AND ANAEROBIC 5CC  Final   Culture  Setup Time   Final    AEROBIC BOTTLE ONLY GRAM POSITIVE COCCI IN CLUSTERS CRITICAL RESULT CALLED TO, READ BACK BY AND VERIFIED WITH: K.GENGLER,RN 0032 08/14/15 M.[place] BY R.GREENE    Culture STAPHYLOCOCCUS AUREUS  Final   Report Status 08/15/2015 FINAL  Final   Organism ID, Bacteria STAPHYLOCOCCUS AUREUS  Final      Susceptibility   Staphylococcus aureus - MIC*    CIPROFLOXACIN >=8 RESISTANT Resistant     ERYTHROMYCIN >=8 RESISTANT Resistant     GENTAMICIN <=0.5 SENSITIVE Sensitive     OXACILLIN 0.5  SENSITIVE Sensitive     TETRACYCLINE <=1 SENSITIVE Sensitive     VANCOMYCIN 1 SENSITIVE Sensitive     TRIMETH/SULFA <=10 SENSITIVE Sensitive     CLINDAMYCIN <=0.25 SENSITIVE Sensitive     RIFAMPIN <=0.5 SENSITIVE Sensitive     Inducible Clindamycin NEGATIVE Sensitive     * STAPHYLOCOCCUS AUREUS  Blood culture (routine x 2)     Status: None (Preliminary result)   Collection Time: 08/12/15 10:50 PM  Result Value Ref Range Status   Specimen Description BLOOD RIGHT ARM  Final   Special Requests BOTTLES DRAWN AEROBIC AND ANAEROBIC 6CC  Final   Culture NO GROWTH 4 DAYS  Final   Report Status PENDING  Incomplete  MRSA PCR Screening     Status: Abnormal   Collection Time: 08/12/15 11:44 PM  Result Value Ref Range Status   MRSA by PCR POSITIVE (A) NEGATIVE Final    Comment:        The GeneXpert MRSA Assay (FDA approved for NASAL specimens only), is one component of a comprehensive MRSA colonization surveillance program. It is not intended to diagnose MRSA infection nor to guide or monitor treatment for MRSA infections. RESULT CALLED TO, READ BACK BY AND VERIFIED WITH: K GENGLER@0219  08/13/15   Wound culture  Status: None   Collection Time: 08/13/15 11:09 AM  Result Value Ref Range Status   Specimen Description WOUND LEFT THIGH GRAFT  Final   Special Requests NONE  Final   Gram Stain   Final    NO WBC SEEN NO SQUAMOUS EPITHELIAL CELLS SEEN RARE GRAM POSITIVE COCCI IN PAIRS Performed at [facility]    Culture   Final    ABUNDANT STAPHYLOCOCCUS AUREUS Note: RIFAMPIN AND GENTAMICIN SHOULD NOT BE USED AS SINGLE DRUGS FOR TREATMENT OF STAPH INFECTIONS. This organism DOES NOT demonstrate inducible Clindamycin resistance in vitro. Performed at [facility]    Report Status 08/16/2015 FINAL  Final   Organism ID, Bacteria STAPHYLOCOCCUS AUREUS  Final      Susceptibility   Staphylococcus aureus - MIC*    CLINDAMYCIN <=0.25 SENSITIVE Sensitive     ERYTHROMYCIN >=8  RESISTANT Resistant     GENTAMICIN <=0.5 SENSITIVE Sensitive     LEVOFLOXACIN 4 INTERMEDIATE Intermediate     OXACILLIN 0.5 SENSITIVE Sensitive     RIFAMPIN <=0.5 SENSITIVE Sensitive     TRIMETH/SULFA <=10 SENSITIVE Sensitive     VANCOMYCIN 1 SENSITIVE Sensitive     TETRACYCLINE <=1 SENSITIVE Sensitive     MOXIFLOXACIN 1 INTERMEDIATE Intermediate     * ABUNDANT STAPHYLOCOCCUS AUREUS  Wound culture     Status: None (Preliminary result)   Collection Time: 08/14/15 10:36 AM  Result Value Ref Range Status   Specimen Description WOUND LEFT THIGH  Final   Special Requests POF VANC AND ZOSYN  SPEC A ON SWAB  Final   Gram Stain   Final    NO WBC SEEN NO SQUAMOUS EPITHELIAL CELLS SEEN NO ORGANISMS SEEN Performed at [facility]    Culture   Final    NO GROWTH 1 DAY Performed at [facility]    Report Status PENDING  Incomplete  Tissue culture     Status: None (Preliminary result)   Collection Time: 08/14/15 10:42 AM  Result Value Ref Range Status   Specimen Description TISSUE LEFT THIGH  Final   Special Requests POF ON VANC AND ZOSYN  SPEC B  Final   Gram Stain   Final    RARE WBC PRESENT, PREDOMINANTLY PMN NO ORGANISMS SEEN Performed at [facility]   Final    Culture reincubated for better growth Performed at [facility]    Report Status PENDING  Incomplete  Culture, blood (routine x 2)     Status: None (Preliminary result)   Collection Time: 08/14/15  8:00 PM  Result Value Ref Range Status   Specimen Description BLOOD HEMODIALYSIS CATHETER  Final   Special Requests BOTTLES DRAWN AEROBIC AND ANAEROBIC 10ML  Final   Culture NO GROWTH 2 DAYS  Final   Report Status PENDING  Incomplete  Culture, blood (routine x 2)     Status: None (Preliminary result)   Collection Time: 08/14/15  9:00 PM  Result Value Ref Range Status   Specimen Description BLOOD LEFT FEMORAL ARTERY HEMODIALYSIS CATHETER  Final   Special Requests BOTTLES DRAWN  AEROBIC AND ANAEROBIC 10CC  Final   Culture NO GROWTH 2 DAYS  Final   Report Status PENDING  Incomplete    Anti-infectives    Start     Dose/Rate Route Frequency Ordered Stop   08/13/15 1200  vancomycin (VANCOCIN) IVPB 1000 mg/200 mL premix     1,000 mg 200 mL/hr over 60 Minutes Intravenous Every T-Th-Sa (Hemodialysis)  08/12/15 2158     08/13/15 1000  imipenem-cilastatin (PRIMAXIN) 250 mg in sodium chloride 0.9 % 100 mL IVPB  Status:  Discontinued     250 mg 200 mL/hr over 30 Minutes Intravenous Every 12 hours 08/12/15 2158 08/14/15 1034   08/12/15 1938  imipenem-cilastatin (PRIMAXIN) 500 mg in sodium chloride 0.9 % 100 mL IVPB     500 mg 200 mL/hr over 30 Minutes Intravenous  Once 08/12/15 1938 08/12/15 2152   08/12/15 1900  vancomycin (VANCOCIN) 2,000 mg in sodium chloride 0.9 % 500 mL IVPB     2,000 mg 250 mL/hr over 120 Minutes Intravenous  Once 08/12/15 1852 08/12/15 2146   08/12/15 1852  piperacillin-tazobactam (ZOSYN) IVPB 2.25 g  Status:  Discontinued     2.25 g 100 mL/hr over 30 Minutes Intravenous  Once 08/12/15 1852 08/12/15 2142      Assessment:  ID: Staph aureus bacteremia, afebrile now, WBC wnl. LA 0.8, PCT 3.09. Went for revision of L thigh AV graft 9/4- removal/replacement of infected segment done. Pre-HD Vanco level 22 in goal range.  Vanc 9/2>>  Imipenem 9/3>>9/4   9/2 BCx: 1/2 MSSA 9/4 tissue: NGTD  9/4 wound: NGTD    Goal of Therapy:  Pre-HD Vancomycin level 15-25 mcg/ml  Resolution of infection   Plan:  -vancomycin 1g IV QHD-TTS (plan 6 weeks per ID recs) with PCN allergy   Cowen Pesqueira S. Alford Highland, PharmD, BCPS Clinical Staff Pharmacist Pager [phone]  Eilene Ghazi Stillinger 08/16/2015,4:45 PM

## 2015-08-16 NOTE — Clinical Documentation Improvement (Addendum)
Hospitalist  If you agree with the nursing assessment, please document the Location, Stage and whether or not present on admission (POA) for the pressure ulcer documented by nursing.  CMS requires that pressure ulcer information be documented by the attending physician.   This information cannot be taken from staff nursing documentation.  Clinical Information: "Pressure Ulcer" is documented in the progress note by Dr. Scot Dock on 08/15/15 at 8:10 AM and continues in subsequent progress notes. Nursing Flowsheet documentation - 08/12/15 at 2215 - "Stage II partial thickness loss of dermis presenting as a shallow open ulcer with a red, pink wound bed with slough.  2 small areas to left upper inner buttock."   Please exercise your independent, professional judgment when responding. A specific answer is not anticipated or expected.   Thank You, Erling Conte  RN BSN CCDS [phone] Health Information Management [place]

## 2015-08-16 NOTE — Progress Notes (Signed)
Received a call from RN saying that pt wasn't tolerating CPAP nasal mask well. Came to assess pt, pt C/O mask being to tight. I loosen the mask and now she c/o that she does not like wearing the Cpap mask and refuse to wear any longer. Pt is now off CPAP, pt O2 saturations are stable at this time no sign of respiratory distress noted. Lab is now at bedside of pt.

## 2015-08-16 NOTE — Progress Notes (Signed)
Utilization review completed.  

## 2015-08-16 NOTE — Progress Notes (Signed)
Initial Nutrition Assessment  DOCUMENTATION CODES:   Morbid obesity  INTERVENTION:    Nepro Shake po TID, each supplement provides 425 kcal and 19 grams protein  NUTRITION DIAGNOSIS:   Increased nutrient needs related to wound healing, chronic illness as evidenced by estimated needs.  GOAL:   Patient will meet greater than or equal to 90% of their needs  MONITOR:   PO intake, Supplement acceptance, Labs, Weight trends  REASON FOR ASSESSMENT:   Consult Wound healing  ASSESSMENT:   54 y.o. female with a history of ESRD on HD since 1992 who presents to the ED with complaints of Fever Chills and Myalgia x 2 days with headache, nausea and vomiting. Admitted for infected segment of left thigh AV graft, S/P removal of graft on 9/4.  Infected part of graft was removed, patient now with a wound on her left thigh. Needs increased protein and calories to promote wound healing.  Patient currently in HD. Unable to complete Nutrition-Focused physical exam at this time. Per discussion with RN, patient has been eating poorly, consuming ~25% of meals. She would benefit from a PO supplement to maximize intake of protein and calories.  Labs reviewed: sodium low, phosphorus elevated   Diet Order:  Diet renal with fluid restriction Fluid restriction:: 1200 mL Fluid; Room service appropriate?: Yes; Fluid consistency:: Thin  Skin:   (stage II pressure ulcer to buttocks; thigh wound infection)  Last BM:  9/2  Height:   Ht Readings from Last 1 Encounters:  08/14/15 5\' 1"  (1.549 m)    Weight:   Wt Readings from Last 1 Encounters:  08/15/15 277 lb 9 oz (125.9 kg)    Ideal Body Weight:  47.7 kg  BMI:  Body mass index is 52.47 kg/(m^2).  Estimated Nutritional Needs:   Kcal:  2100-2400  Protein:  100-110 gm  Fluid:  1.2 L  EDUCATION NEEDS:   No education needs identified at this time  Molli Barrows, [place], [place], [place] Pager [phone] After Hours Pager [phone]

## 2015-08-17 DIAGNOSIS — T827XXD Infection and inflammatory reaction due to other cardiac and vascular devices, implants and grafts, subsequent encounter: Secondary | ICD-10-CM

## 2015-08-17 DIAGNOSIS — J449 Chronic obstructive pulmonary disease, unspecified: Secondary | ICD-10-CM

## 2015-08-17 DIAGNOSIS — Z992 Dependence on renal dialysis: Secondary | ICD-10-CM

## 2015-08-17 DIAGNOSIS — E669 Obesity, unspecified: Secondary | ICD-10-CM

## 2015-08-17 DIAGNOSIS — E119 Type 2 diabetes mellitus without complications: Secondary | ICD-10-CM

## 2015-08-17 DIAGNOSIS — L8990 Pressure ulcer of unspecified site, unspecified stage: Secondary | ICD-10-CM

## 2015-08-17 DIAGNOSIS — Z113 Encounter for screening for infections with a predominantly sexual mode of transmission: Secondary | ICD-10-CM

## 2015-08-17 DIAGNOSIS — N186 End stage renal disease: Secondary | ICD-10-CM

## 2015-08-17 DIAGNOSIS — I9589 Other hypotension: Secondary | ICD-10-CM

## 2015-08-17 DIAGNOSIS — A4101 Sepsis due to Methicillin susceptible Staphylococcus aureus: Secondary | ICD-10-CM

## 2015-08-17 DIAGNOSIS — J9601 Acute respiratory failure with hypoxia: Secondary | ICD-10-CM

## 2015-08-17 DIAGNOSIS — R7881 Bacteremia: Secondary | ICD-10-CM | POA: Insufficient documentation

## 2015-08-17 DIAGNOSIS — A419 Sepsis, unspecified organism: Secondary | ICD-10-CM

## 2015-08-17 DIAGNOSIS — R6521 Severe sepsis with septic shock: Secondary | ICD-10-CM

## 2015-08-17 LAB — CULTURE, BLOOD (ROUTINE X 2): Culture: NO GROWTH

## 2015-08-17 LAB — CBC
HCT: 32.3 % — ABNORMAL LOW (ref 36.0–46.0)
HEMOGLOBIN: 9.8 g/dL — AB (ref 12.0–15.0)
MCH: 28.9 pg (ref 26.0–34.0)
MCHC: 30.3 g/dL (ref 30.0–36.0)
MCV: 95.3 fL (ref 78.0–100.0)
Platelets: 165 10*3/uL (ref 150–400)
RBC: 3.39 MIL/uL — AB (ref 3.87–5.11)
RDW: 14.5 % (ref 11.5–15.5)
WBC: 4.6 10*3/uL (ref 4.0–10.5)

## 2015-08-17 LAB — GLUCOSE, CAPILLARY
GLUCOSE-CAPILLARY: 75 mg/dL (ref 65–99)
GLUCOSE-CAPILLARY: 83 mg/dL (ref 65–99)
GLUCOSE-CAPILLARY: 91 mg/dL (ref 65–99)
GLUCOSE-CAPILLARY: 96 mg/dL (ref 65–99)
Glucose-Capillary: 81 mg/dL (ref 65–99)

## 2015-08-17 LAB — HEMOGLOBIN A1C
HEMOGLOBIN A1C: 6.6 % — AB (ref 4.8–5.6)
MEAN PLASMA GLUCOSE: 143 mg/dL

## 2015-08-17 LAB — HCV COMMENT:

## 2015-08-17 LAB — BASIC METABOLIC PANEL
Anion gap: 10 (ref 5–15)
BUN: 18 mg/dL (ref 6–20)
CHLORIDE: 99 mmol/L — AB (ref 101–111)
CO2: 27 mmol/L (ref 22–32)
CREATININE: 6.73 mg/dL — AB (ref 0.44–1.00)
Calcium: 8.1 mg/dL — ABNORMAL LOW (ref 8.9–10.3)
GFR calc Af Amer: 7 mL/min — ABNORMAL LOW (ref >60)
GFR calc non Af Amer: 6 mL/min — ABNORMAL LOW (ref >60)
GLUCOSE: 81 mg/dL (ref 65–99)
POTASSIUM: 4.4 mmol/L (ref 3.5–5.1)
Sodium: 136 mmol/L (ref 135–145)

## 2015-08-17 LAB — ACTH STIMULATION, 3 TIME POINTS
Cortisol, 30 Min: 13.7 ug/dL
Cortisol, 60 Min: 15.2 ug/dL
Cortisol, Base: 11.6 ug/dL

## 2015-08-17 LAB — HEPATITIS C ANTIBODY (REFLEX)

## 2015-08-17 MED ORDER — DEXTROSE-NACL 5-0.45 % IV SOLN
INTRAVENOUS | Status: DC
  Administered 2015-08-17: 06:00:00 via INTRAVENOUS

## 2015-08-17 NOTE — Progress Notes (Signed)
  [facility] Progress Note   Subjective: BP's 70's today, has gotten up, afebrile  Filed Vitals:   08/16/15 1958 08/16/15 2304 08/17/15 0427 08/17/15 0812  BP: 88/47 78/46 77/43    Pulse: 86 91    Temp: 97.9 F (36.6 C) 98.6 F (37 C) 98.6 F (37 C) 97.9 F (36.6 C)  TempSrc: Oral Oral Oral Oral  Resp: 23 20    Height:      Weight:      SpO2: 98% 98%     Exam: Alert, no distress No jvd Chest clear bilat RRR no MRG Abd soft ntnd obese L thigh AVG +bruit, clean wounds Neuro is nf, Ox 3  TTS [place]  4h 71min   2/2.5 bath   122.5kg   Heparin 3000/1000 mid Rx   L thigh AVG Hect 2ug Last pth 201, P ok Ca 10 Hb 11.7      Assessment: 1 AVG infection, sp revision 2 Staph sepsis 3 Hypotension, acute on chronic; on midodrine 4 ESRD 5 Anemia stable 6 Hx PE 7 DM  Plan - HD tomorrow, hopefully BP will come up some more     Kelly Splinter MD  pager [phone]    cell [phone]  08/17/2015, 11:53 AM     Recent Labs Lab 08/13/15 0238 08/14/15 0832 08/15/15 1300 08/15/15 2052 08/16/15 0332  NA 131* 138 134* 132*  --   K 4.5 4.1 4.1 4.2  --   CL 96*  --  98* 94*  --   CO2 25  --  25 26  --   GLUCOSE 139* 93 88 94  --   BUN 30*  --  28* 29*  --   CREATININE 7.40*  --  7.42* 7.97*  --   CALCIUM 8.7*  --  7.5* 7.8*  --   PHOS  --   --  4.8* 5.2* 5.1*    Recent Labs Lab 08/12/15 1748 08/15/15 2052  AST 21  --   ALT 19  --   ALKPHOS 166*  --   BILITOT 1.3*  --   PROT 8.5*  --   ALBUMIN 4.0 3.5    Recent Labs Lab 08/13/15 0238 08/14/15 0832 08/15/15 0236 08/16/15 0332  WBC 6.0  --  7.7 4.7  HGB 11.4* 12.9 11.3* 9.7*  HCT 36.0 38.0 38.1 32.6*  MCV 92.3  --  95.3 93.7  PLT 155  --  182 159   . antiseptic oral rinse  7 mL Mouth Rinse BID  . calcium acetate  1,334 mg Oral TID WC  . calcium carbonate  1,000 mg Oral QAC supper  . doxercalciferol  2 mcg Intravenous Q T,Th,Sa-HD  . feeding supplement (NEPRO CARB STEADY)  237 mL Oral TID BM   . heparin  3,000 Units Dialysis Once in dialysis  . heparin  5,000 Units Subcutaneous 3 times per day  . insulin aspart  0-9 Units Subcutaneous TID WC  . midodrine  10 mg Oral TID WC  . multivitamin  1 tablet Oral QHS  . mupirocin ointment  1 application Nasal BID  . sodium chloride  3 mL Intravenous Q12H  . vancomycin  1,000 mg Intravenous Q T,Th,Sa-HD   . dextrose 5 % and 0.45% NaCl 50 mL/hr at 08/17/15 0550  . phenylephrine (NEO-SYNEPHRINE) Adult infusion Stopped (08/15/15 1159)   sodium chloride, sodium chloride, sodium chloride, sodium chloride, acetaminophen **OR** acetaminophen, alteplase, heparin, ipratropium-albuterol, lidocaine (PF), lidocaine-prilocaine, ondansetron **OR** ondansetron (ZOFRAN) IV, oxyCODONE, pentafluoroprop-tetrafluoroeth

## 2015-08-17 NOTE — Evaluation (Signed)
Physical Therapy Evaluation Patient Details Name: [name] MRN: [id number] DOB: 05-28-1961 Today's Date: 08/17/2015   History of Present Illness  Pt is a 54 y/o female with a PMH of ESRD on HD since 1992. She was admitted 9/2 with a SA bacteremia felt to be due to infected thigh AVG. Went to OR 9/4 and underwent removal of infected segment and revision of L thigh AVG. Post-op she was hypotensive and on NIPPV.  Clinical Impression  Progressing steadily with gait distance and stability.     Follow Up Recommendations Home health PT;Supervision for mobility/OOB    Equipment Recommendations  None recommended by PT    Recommendations for Other Services Rehab consult     Precautions / Restrictions Precautions Precautions: Fall      Mobility  Bed Mobility Overal bed mobility: Needs Assistance Bed Mobility: Supine to Sit     Supine to sit: Min guard     General bed mobility comments: increased time, mild struggle  Transfers Overall transfer level: Needs assistance   Transfers: Sit to/from Stand;Stand Pivot Transfers Sit to Stand: Min assist         General transfer comment: assist to come forward  Ambulation/Gait Ambulation/Gait assistance: Min assist Ambulation Distance (Feet): 170 Feet Assistive device: Rolling walker (2 wheeled) Gait Pattern/deviations: Step-through pattern;Decreased step length - right;Decreased step length - left Gait velocity: very slow Gait velocity interpretation: <1.8 ft/sec, indicative of risk for recurrent falls General Gait Details: short wadding steps, mildly unsteady but safe with RW  Stairs            Wheelchair Mobility    Modified Rankin (Stroke Patients Only)       Balance Overall balance assessment: Needs assistance Sitting-balance support: Bilateral upper extremity supported Sitting balance-Leahy Scale: Fair     Standing balance support: No upper extremity supported Standing balance-Leahy Scale: Fair                                Pertinent Vitals/Pain Pain Assessment: Faces Faces Pain Scale: Hurts a little bit    Home Living                        Prior Function                 Hand Dominance        Extremity/Trunk Assessment                         Communication      Cognition Arousal/Alertness: Awake/alert Behavior During Therapy: WFL for tasks assessed/performed Overall Cognitive Status: Within Functional Limits for tasks assessed                      General Comments      Exercises        Assessment/Plan    PT Assessment    PT Diagnosis     PT Problem List    PT Treatment Interventions     PT Goals (Current goals can be found in the Care Plan section) Acute Rehab PT Goals Patient Stated Goal: Return home PT Goal Formulation: With patient Time For Goal Achievement: 08/22/15 Potential to Achieve Goals: Good    Frequency Min 3X/week   Barriers to discharge        Co-evaluation  End of Session   Activity Tolerance: Patient tolerated treatment well Patient left: in chair;with call bell/phone within reach;with nursing/sitter in room Nurse Communication: Mobility status         Time: [id number] PT Time Calculation (min) (ACUTE ONLY): 19 min   Charges:     PT Treatments $Gait Training: 8-22 mins   PT G Codes:        Myrka Sylva, Tessie Fass 08/17/2015, 5:36 PM  08/17/2015  Donnella Sham, PT [phone]  (pager)

## 2015-08-17 NOTE — Progress Notes (Signed)
Patient states she is not ready for CPAP at this time. Patient will have the nurse call when she is ready. RT will continue to monitor.

## 2015-08-17 NOTE — Progress Notes (Signed)
INFECTIOUS DISEASE PROGRESS NOTE  ID: [name] is a 54 y.o. female with  Active Problems:   ESRD on hemodialysis   Acute respiratory failure with hypoxia   Sepsis   SIRS (systemic inflammatory response syndrome)   Diabetes mellitus type 2 in obese   OSA on CPAP   Chronic hypotension   COPD (chronic obstructive pulmonary disease)   Pressure ulcer   Infection and inflammatory reaction due to cardiac device, implant, and graft   Staphylococcus aureus bacteremia with sepsis   Severe sepsis with septic shock   Screen for STD (sexually transmitted disease)  Subjective: without complaints, wants to go home.   Abtx:  Anti-infectives    Start     Dose/Rate Route Frequency Ordered Stop   08/13/15 1200  vancomycin (VANCOCIN) IVPB 1000 mg/200 mL premix     1,000 mg 200 mL/hr over 60 Minutes Intravenous Every T-Th-Sa (Hemodialysis) 08/12/15 2158     08/13/15 1000  imipenem-cilastatin (PRIMAXIN) 250 mg in sodium chloride 0.9 % 100 mL IVPB  Status:  Discontinued     250 mg 200 mL/hr over 30 Minutes Intravenous Every 12 hours 08/12/15 2158 08/14/15 1034   08/12/15 1938  imipenem-cilastatin (PRIMAXIN) 500 mg in sodium chloride 0.9 % 100 mL IVPB     500 mg 200 mL/hr over 30 Minutes Intravenous  Once 08/12/15 1938 08/12/15 2152   08/12/15 1900  vancomycin (VANCOCIN) 2,000 mg in sodium chloride 0.9 % 500 mL IVPB     2,000 mg 250 mL/hr over 120 Minutes Intravenous  Once 08/12/15 1852 08/12/15 2146   08/12/15 1852  piperacillin-tazobactam (ZOSYN) IVPB 2.25 g  Status:  Discontinued     2.25 g 100 mL/hr over 30 Minutes Intravenous  Once 08/12/15 1852 08/12/15 2142      Medications:  Scheduled: . antiseptic oral rinse  7 mL Mouth Rinse BID  . calcium acetate  1,334 mg Oral TID WC  . calcium carbonate  1,000 mg Oral QAC supper  . doxercalciferol  2 mcg Intravenous Q T,Th,Sa-HD  . feeding supplement (NEPRO CARB STEADY)  237 mL Oral TID BM  . heparin  3,000 Units Dialysis Once in  dialysis  . heparin  5,000 Units Subcutaneous 3 times per day  . insulin aspart  0-9 Units Subcutaneous TID WC  . midodrine  10 mg Oral TID WC  . multivitamin  1 tablet Oral QHS  . mupirocin ointment  1 application Nasal BID  . sodium chloride  3 mL Intravenous Q12H  . vancomycin  1,000 mg Intravenous Q T,Th,Sa-HD    Objective: Vital signs in last 24 hours: Temp:  [97.5 F (36.4 C)-98.6 F (37 C)] 97.5 F (36.4 C) (09/07 1223) Pulse Rate:  [66-91] 91 (09/06 2304) Resp:  [16-23] 20 (09/06 2304) BP: (61-105)/(34-82) 77/43 mmHg (09/07 0427) SpO2:  [92 %-99 %] 98 % (09/06 2304) Weight:  [127.8 kg (281 lb 12 oz)] 127.8 kg (281 lb 12 oz) (09/06 1430)   General appearance: alert, cooperative and no distress Resp: clear to auscultation bilaterally Cardio: regular rate and rhythm GI: normal findings: bowel sounds normal and soft, non-tender  Lab Results  Recent Labs  08/15/15 2052 08/16/15 0332 08/17/15 1024  WBC  --  4.7 4.6  HGB  --  9.7* 9.8*  HCT  --  32.6* 32.3*  NA 132*  --  136  K 4.2  --  4.4  CL 94*  --  99*  CO2 26  --  27  BUN  29*  --  18  CREATININE 7.97*  --  6.73*   Liver Panel  Recent Labs  08/15/15 2052  ALBUMIN 3.5   Sedimentation Rate  Recent Labs  08/16/15 0332  ESRSEDRATE 61*   C-Reactive Protein  Recent Labs  08/16/15 0332  CRP 11.9*    Microbiology: Recent Results (from the past 240 hour(s))  Blood culture (routine x 2)     Status: None   Collection Time: 08/12/15  5:36 PM  Result Value Ref Range Status   Specimen Description BLOOD LEFT ARM  Final   Special Requests BOTTLES DRAWN AEROBIC AND ANAEROBIC 5CC  Final   Culture  Setup Time   Final    AEROBIC BOTTLE ONLY GRAM POSITIVE COCCI IN CLUSTERS CRITICAL RESULT CALLED TO, READ BACK BY AND VERIFIED WITH: K.GENGLER,RN 0032 08/14/15 M.[place] BY R.[place]    Culture STAPHYLOCOCCUS AUREUS  Final   Report Status 08/15/2015 FINAL  Final   Organism ID, Bacteria  STAPHYLOCOCCUS AUREUS  Final      Susceptibility   Staphylococcus aureus - MIC*    CIPROFLOXACIN >=8 RESISTANT Resistant     ERYTHROMYCIN >=8 RESISTANT Resistant     GENTAMICIN <=0.5 SENSITIVE Sensitive     OXACILLIN 0.5 SENSITIVE Sensitive     TETRACYCLINE <=1 SENSITIVE Sensitive     VANCOMYCIN 1 SENSITIVE Sensitive     TRIMETH/SULFA <=10 SENSITIVE Sensitive     CLINDAMYCIN <=0.25 SENSITIVE Sensitive     RIFAMPIN <=0.5 SENSITIVE Sensitive     Inducible Clindamycin NEGATIVE Sensitive     * STAPHYLOCOCCUS AUREUS  Blood culture (routine x 2)     Status: None (Preliminary result)   Collection Time: 08/12/15 10:50 PM  Result Value Ref Range Status   Specimen Description BLOOD RIGHT ARM  Final   Special Requests BOTTLES DRAWN AEROBIC AND ANAEROBIC 6CC  Final   Culture NO GROWTH 4 DAYS  Final   Report Status PENDING  Incomplete  MRSA PCR Screening     Status: Abnormal   Collection Time: 08/12/15 11:44 PM  Result Value Ref Range Status   MRSA by PCR POSITIVE (A) NEGATIVE Final    Comment:        The GeneXpert MRSA Assay (FDA approved for NASAL specimens only), is one component of a comprehensive MRSA colonization surveillance program. It is not intended to diagnose MRSA infection nor to guide or monitor treatment for MRSA infections. RESULT CALLED TO, READ BACK BY AND VERIFIED WITH: K GENGLER@0219  08/13/15   Wound culture     Status: None   Collection Time: 08/13/15 11:09 AM  Result Value Ref Range Status   Specimen Description WOUND LEFT THIGH GRAFT  Final   Special Requests NONE  Final   Gram Stain   Final    NO WBC SEEN NO SQUAMOUS EPITHELIAL CELLS SEEN RARE GRAM POSITIVE COCCI IN PAIRS Performed at [facility]    Culture   Final    ABUNDANT STAPHYLOCOCCUS AUREUS Note: RIFAMPIN AND GENTAMICIN SHOULD NOT BE USED AS SINGLE DRUGS FOR TREATMENT OF STAPH INFECTIONS. This organism DOES NOT demonstrate inducible Clindamycin resistance in vitro. Performed at [facility]    Report Status 08/16/2015 FINAL  Final   Organism ID, Bacteria STAPHYLOCOCCUS AUREUS  Final      Susceptibility   Staphylococcus aureus - MIC*    CLINDAMYCIN <=0.25 SENSITIVE Sensitive     ERYTHROMYCIN >=8 RESISTANT Resistant     GENTAMICIN <=0.5 SENSITIVE Sensitive     LEVOFLOXACIN 4 INTERMEDIATE  Intermediate     OXACILLIN 0.5 SENSITIVE Sensitive     RIFAMPIN <=0.5 SENSITIVE Sensitive     TRIMETH/SULFA <=10 SENSITIVE Sensitive     VANCOMYCIN 1 SENSITIVE Sensitive     TETRACYCLINE <=1 SENSITIVE Sensitive     MOXIFLOXACIN 1 INTERMEDIATE Intermediate     * ABUNDANT STAPHYLOCOCCUS AUREUS  Wound culture     Status: None (Preliminary result)   Collection Time: 08/14/15 10:36 AM  Result Value Ref Range Status   Specimen Description WOUND LEFT THIGH  Final   Special Requests POF VANC AND ZOSYN  SPEC A ON SWAB  Final   Gram Stain   Final    NO WBC SEEN NO SQUAMOUS EPITHELIAL CELLS SEEN NO ORGANISMS SEEN Performed at [facility]    Culture   Final    Culture reincubated for better growth Performed at [facility]    Report Status PENDING  Incomplete  Tissue culture     Status: None (Preliminary result)   Collection Time: 08/14/15 10:42 AM  Result Value Ref Range Status   Specimen Description TISSUE LEFT THIGH  Final   Special Requests POF ON VANC AND ZOSYN  SPEC B  Final   Gram Stain   Final    RARE WBC PRESENT, PREDOMINANTLY PMN NO ORGANISMS SEEN Performed at [facility]    Culture   Final    RARE STAPHYLOCOCCUS AUREUS Note: RIFAMPIN AND GENTAMICIN SHOULD NOT BE USED AS SINGLE DRUGS FOR TREATMENT OF STAPH INFECTIONS. Performed at [facility]    Report Status PENDING  Incomplete  Culture, blood (routine x 2)     Status: None (Preliminary result)   Collection Time: 08/14/15  8:00 PM  Result Value Ref Range Status   Specimen Description BLOOD HEMODIALYSIS CATHETER  Final   Special Requests BOTTLES DRAWN AEROBIC AND ANAEROBIC  10ML  Final   Culture NO GROWTH 2 DAYS  Final   Report Status PENDING  Incomplete  Culture, blood (routine x 2)     Status: None (Preliminary result)   Collection Time: 08/14/15  9:00 PM  Result Value Ref Range Status   Specimen Description BLOOD LEFT FEMORAL ARTERY HEMODIALYSIS CATHETER  Final   Special Requests BOTTLES DRAWN AEROBIC AND ANAEROBIC 10CC  Final   Culture NO GROWTH 2 DAYS  Final   Report Status PENDING  Incomplete    Studies/Results: No results found.   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   Therapeutic drug monitoring  On HD, Cr of little value to follow.   Appreciate pharm eval of her vanco levels.   Total days of antibiotics: 5 vanco         Bobby Rumpf Infectious Diseases (pager) [phone] [email or web address] 08/17/2015, 12:26 PM  LOS: 5 days

## 2015-08-17 NOTE — Progress Notes (Signed)
[facility]                                                                              Patient Demographics  [name], is a 54 y.o. female, DOB - Sep 24, 1961, [id number]  Admit date - 08/12/2015   Admitting Physician Theressa Millard, MD  Outpatient Primary MD for the patient is Barbette Merino, MD  LOS - 5   Chief Complaint  Patient presents with  . Chills  . Emesis  . Headache      HPI on 08/12/2015 by Dr. Jana [name] [name] is a 54 y.o. female with a history of ESRD on HD since 1992 who presents to the ED with complaints of Fever Chills and Myalgia x 2 days with headache, nausea and vomiting. She denies any neck stiffness. She reports occasional Cough that is nonproductive. She reports SOB and in the ED she was found to have hypoxia to 85% and was placed on 3 liters NCO2 and improved to 94%. She had a temperature of 102 in the ED. Chest Xray was negative for any acute findings, and a UA and Blood Cultures were ordered. She was placed on IV Vancomycin and Primaxin and referred for admission.  Assessment & Plan   Septic shock secondary to MSSA bacteremia -Patient does have baseline hypotension, systolics of [id number]. -Continue Midodrine. -ACTH stimulation test pending -Continue vancomycin -Infectious disease consulted, recommended 6 weeks of vancomycin with HD -Repeat blood cultures 08/14/2015 to date.  HD graft infection -Debrided on 9/4 -Vascular surgery following  End-stage renal disease -Nephrology consultation appreciated, continue HD TTS  Acute hypoxic respiratory failure secondary to hypoventilation postop/COPD -Appears to be stable, no wheezing on exams -Continue bronchodilators as needed  Diabetes mellitus with episodes of hypoglycemia -Hemoglobin A1c 6.6 -Lantus held -Continue insulin sliding scale CBG monitoring  Code Status: Full  Family Communication:  None at bedside  Disposition Plan: Admitted.  Continue to  monitor blood pressure  Time Spent in minutes   30 minutes  Procedures/ Events 9/2 admitted w/ working dx of sepsis from infected thigh AVG. abx started.  9/3 seen by vascular.  9/4 to OR as blood cultures were positive and no other clear source other than AVG. Infected are removed and AVG revised. Hypotensive after surgery. PCCM asked to assume care in ICU  Consults   Nephrology Vascular surgery PCCM  DVT Prophylaxis  heparin  Lab Results  Component Value Date   PLT 159 08/16/2015    Medications  Scheduled Meds: . antiseptic oral rinse  7 mL Mouth Rinse BID  . calcium acetate  1,334 mg Oral TID WC  . calcium carbonate  1,000 mg Oral QAC supper  . doxercalciferol  2 mcg Intravenous Q T,Th,Sa-HD  . feeding supplement (NEPRO CARB STEADY)  237 mL Oral TID BM  . heparin  3,000 Units Dialysis Once in dialysis  . heparin  5,000 Units Subcutaneous 3 times per day  . insulin aspart  0-9 Units Subcutaneous TID WC  . midodrine  10 mg Oral TID WC  . multivitamin  1 tablet Oral QHS  . mupirocin ointment  1 application Nasal BID  . sodium chloride  3 mL Intravenous Q12H  .  vancomycin  1,000 mg Intravenous Q T,Th,Sa-HD   Continuous Infusions: . dextrose 5 % and 0.45% NaCl 50 mL/hr at 08/17/15 0550  . phenylephrine (NEO-SYNEPHRINE) Adult infusion Stopped (08/15/15 1159)   PRN Meds:.sodium chloride, sodium chloride, sodium chloride, sodium chloride, acetaminophen **OR** acetaminophen, alteplase, heparin, ipratropium-albuterol, lidocaine (PF), lidocaine-prilocaine, ondansetron **OR** ondansetron (ZOFRAN) IV, oxyCODONE, pentafluoroprop-tetrafluoroeth  Antibiotics    Anti-infectives    Start     Dose/Rate Route Frequency Ordered Stop   08/13/15 1200  vancomycin (VANCOCIN) IVPB 1000 mg/200 mL premix     1,000 mg 200 mL/hr over 60 Minutes Intravenous Every T-Th-Sa (Hemodialysis) 08/12/15 2158     08/13/15 1000  imipenem-cilastatin (PRIMAXIN) 250 mg in sodium chloride 0.9 % 100 mL IVPB   Status:  Discontinued     250 mg 200 mL/hr over 30 Minutes Intravenous Every 12 hours 08/12/15 2158 08/14/15 1034   08/12/15 1938  imipenem-cilastatin (PRIMAXIN) 500 mg in sodium chloride 0.9 % 100 mL IVPB     500 mg 200 mL/hr over 30 Minutes Intravenous  Once 08/12/15 1938 08/12/15 2152   08/12/15 1900  vancomycin (VANCOCIN) 2,000 mg in sodium chloride 0.9 % 500 mL IVPB     2,000 mg 250 mL/hr over 120 Minutes Intravenous  Once 08/12/15 1852 08/12/15 2146   08/12/15 1852  piperacillin-tazobactam (ZOSYN) IVPB 2.25 g  Status:  Discontinued     2.25 g 100 mL/hr over 30 Minutes Intravenous  Once 08/12/15 1852 08/12/15 2142        Subjective:   [name] seen and examined today.  Patient somewhat groggy this morning. Denies any pain, abdominal pain, chest pain, shortness of breath, nausea or vomiting.  Objective:   Filed Vitals:   08/16/15 1958 08/16/15 2304 08/17/15 0427 08/17/15 0812  BP: 88/47 78/46 77/43    Pulse: 86 91    Temp: 97.9 F (36.6 C) 98.6 F (37 C) 98.6 F (37 C) 97.9 F (36.6 C)  TempSrc: Oral Oral Oral Oral  Resp: 23 20    Height:      Weight:      SpO2: 98% 98%      Wt Readings from Last 3 Encounters:  08/16/15 127.8 kg (281 lb 12 oz)  01/28/15 77.111 kg (170 lb)  01/25/15 122.6 kg (270 lb 4.5 oz)     Intake/Output Summary (Last 24 hours) at 08/17/15 1035 Last data filed at 08/17/15 0130  Gross per 24 hour  Intake    520 ml  Output   2100 ml  Net  -1580 ml    Exam  General: Well developed, well nourished, NAD, appears stated age  HEENT: NCAT, mucous membranes moist.   Cardiovascular: S1 S2 auscultated, RRR, soft murmur.  Respiratory: Clear to auscultation bilaterally with equal chest rise  Abdomen: Soft, obese, nontender, nondistended, + bowel sounds  Extremities: warm dry without cyanosis clubbing. Mild edema  Neuro: AAOx3, slow to respond, otherwise nonfocal  Data Review   Micro Results Recent Results (from the past 240  hour(s))  Blood culture (routine x 2)     Status: None   Collection Time: 08/12/15  5:36 PM  Result Value Ref Range Status   Specimen Description BLOOD LEFT ARM  Final   Special Requests BOTTLES DRAWN AEROBIC AND ANAEROBIC 5CC  Final   Culture  Setup Time   Final    AEROBIC BOTTLE ONLY GRAM POSITIVE COCCI IN CLUSTERS CRITICAL RESULT CALLED TO, READ BACK BY AND VERIFIED WITH: K.GENGLER,RN 0032 08/14/15 M.[place] CONFIMRED BY [id number]  Culture STAPHYLOCOCCUS AUREUS  Final   Report Status 08/15/2015 FINAL  Final   Organism ID, Bacteria STAPHYLOCOCCUS AUREUS  Final      Susceptibility   Staphylococcus aureus - MIC*    CIPROFLOXACIN >=8 RESISTANT Resistant     ERYTHROMYCIN >=8 RESISTANT Resistant     GENTAMICIN <=0.5 SENSITIVE Sensitive     OXACILLIN 0.5 SENSITIVE Sensitive     TETRACYCLINE <=1 SENSITIVE Sensitive     VANCOMYCIN 1 SENSITIVE Sensitive     TRIMETH/SULFA <=10 SENSITIVE Sensitive     CLINDAMYCIN <=0.25 SENSITIVE Sensitive     RIFAMPIN <=0.5 SENSITIVE Sensitive     Inducible Clindamycin NEGATIVE Sensitive     * STAPHYLOCOCCUS AUREUS  Blood culture (routine x 2)     Status: None (Preliminary result)   Collection Time: 08/12/15 10:50 PM  Result Value Ref Range Status   Specimen Description BLOOD RIGHT ARM  Final   Special Requests BOTTLES DRAWN AEROBIC AND ANAEROBIC 6CC  Final   Culture NO GROWTH 4 DAYS  Final   Report Status PENDING  Incomplete  MRSA PCR Screening     Status: Abnormal   Collection Time: 08/12/15 11:44 PM  Result Value Ref Range Status   MRSA by PCR POSITIVE (A) NEGATIVE Final    Comment:        The GeneXpert MRSA Assay (FDA approved for NASAL specimens only), is one component of a comprehensive MRSA colonization surveillance program. It is not intended to diagnose MRSA infection nor to guide or monitor treatment for MRSA infections. RESULT CALLED TO, READ BACK BY AND VERIFIED WITH: K GENGLER@0219  08/13/15   Wound culture     Status: None     Collection Time: 08/13/15 11:09 AM  Result Value Ref Range Status   Specimen Description WOUND LEFT THIGH GRAFT  Final   Special Requests NONE  Final   Gram Stain   Final    NO WBC SEEN NO SQUAMOUS EPITHELIAL CELLS SEEN RARE GRAM POSITIVE COCCI IN PAIRS Performed at [facility]    Culture   Final    ABUNDANT STAPHYLOCOCCUS AUREUS Note: RIFAMPIN AND GENTAMICIN SHOULD NOT BE USED AS SINGLE DRUGS FOR TREATMENT OF STAPH INFECTIONS. This organism DOES NOT demonstrate inducible Clindamycin resistance in vitro. Performed at [facility]    Report Status 08/16/2015 FINAL  Final   Organism ID, Bacteria STAPHYLOCOCCUS AUREUS  Final      Susceptibility   Staphylococcus aureus - MIC*    CLINDAMYCIN <=0.25 SENSITIVE Sensitive     ERYTHROMYCIN >=8 RESISTANT Resistant     GENTAMICIN <=0.5 SENSITIVE Sensitive     LEVOFLOXACIN 4 INTERMEDIATE Intermediate     OXACILLIN 0.5 SENSITIVE Sensitive     RIFAMPIN <=0.5 SENSITIVE Sensitive     TRIMETH/SULFA <=10 SENSITIVE Sensitive     VANCOMYCIN 1 SENSITIVE Sensitive     TETRACYCLINE <=1 SENSITIVE Sensitive     MOXIFLOXACIN 1 INTERMEDIATE Intermediate     * ABUNDANT STAPHYLOCOCCUS AUREUS  Wound culture     Status: None (Preliminary result)   Collection Time: 08/14/15 10:36 AM  Result Value Ref Range Status   Specimen Description WOUND LEFT THIGH  Final   Special Requests POF VANC AND ZOSYN  SPEC A ON SWAB  Final   Gram Stain   Final    NO WBC SEEN NO SQUAMOUS EPITHELIAL CELLS SEEN NO ORGANISMS SEEN Performed at [facility]    Culture   Final    Culture reincubated for better growth Performed at [facility]    Report Status PENDING  Incomplete  Tissue culture     Status: None (Preliminary result)   Collection Time: 08/14/15 10:42 AM  Result Value Ref Range Status   Specimen Description TISSUE LEFT THIGH  Final   Special Requests POF ON VANC AND ZOSYN  SPEC B  Final   Gram Stain   Final    RARE WBC  PRESENT, PREDOMINANTLY PMN NO ORGANISMS SEEN Performed at [facility]    Culture   Final    RARE STAPHYLOCOCCUS AUREUS Note: RIFAMPIN AND GENTAMICIN SHOULD NOT BE USED AS SINGLE DRUGS FOR TREATMENT OF STAPH INFECTIONS. Performed at [facility]    Report Status PENDING  Incomplete  Culture, blood (routine x 2)     Status: None (Preliminary result)   Collection Time: 08/14/15  8:00 PM  Result Value Ref Range Status   Specimen Description BLOOD HEMODIALYSIS CATHETER  Final   Special Requests BOTTLES DRAWN AEROBIC AND ANAEROBIC 10ML  Final   Culture NO GROWTH 2 DAYS  Final   Report Status PENDING  Incomplete  Culture, blood (routine x 2)     Status: None (Preliminary result)   Collection Time: 08/14/15  9:00 PM  Result Value Ref Range Status   Specimen Description BLOOD LEFT FEMORAL ARTERY HEMODIALYSIS CATHETER  Final   Special Requests BOTTLES DRAWN AEROBIC AND ANAEROBIC 10CC  Final   Culture NO GROWTH 2 DAYS  Final   Report Status PENDING  Incomplete    Radiology Reports Dg Chest Port 1 View  08/15/2015   CLINICAL DATA:  54 year old female with hypoxia. Initial encounter.  EXAM: PORTABLE CHEST - 1 VIEW  COMPARISON:  08/12/2015 and earlier.  FINDINGS: Portable AP semi upright view at 0819 hours. Stable lung volumes. Stable cardiac size and mediastinal contours. No pneumothorax. No pleural effusion or consolidation. Stable pulmonary vascularity without overt edema. Visualized tracheal air column is within normal limits.  IMPRESSION: Stable pulmonary vascular congestion without overt edema.   Electronically Signed   By: Genevie Ann M.D.   On: 08/15/2015 08:48   Dg Chest Port 1 View  08/12/2015   CLINICAL DATA:  Headache.  Chills.  Nausea and vomiting for 1 day.  EXAM: PORTABLE CHEST - 1 VIEW  COMPARISON:  08/12/2014  FINDINGS: Vascular stent over the right mediastinum, likely in the SVC.  Thoracic spondylosis. Heart size within normal limits. Upper zone pulmonary vascular  prominence, indeterminate due to the semi recumbent positioning. No overt edema. The lungs appear otherwise clear.  IMPRESSION: 1. No edema. Vascular stent along the right mediastinum presumably in the SVC. 2. Thoracic spondylosis.   Electronically Signed   By: Van Clines M.D.   On: 08/12/2015 18:32   Dg Abd 2 Views  08/13/2015   CLINICAL DATA:  End-stage renal disease on hemodialysis. Abdominal pain.  EXAM: ABDOMEN - 2 VIEW  COMPARISON:  None.  FINDINGS: No dilated loops of large or small bowel. Gas and stool in the rectum. No pathologic calcifications. No organomegaly. No acute osseous abnormality  IMPRESSION: No acute abdominal findings   Electronically Signed   By: Suzy Bouchard M.D.   On: 08/13/2015 13:57    CBC  Recent Labs Lab 08/12/15 1748 08/13/15 0238 08/14/15 0832 08/15/15 0236 08/16/15 0332  WBC 6.8 6.0  --  7.7 4.7  HGB 12.7 11.4* 12.9 11.3* 9.7*  HCT 40.8 36.0 38.0 38.1 32.6*  PLT 168 155  --  182 159  MCV 92.5 92.3  --  95.3 93.7  MCH 28.8 29.2  --  28.3 27.9  MCHC 31.1 31.7  --  29.7* 29.8*  RDW 14.1 14.2  --  14.4 14.3    Chemistries   Recent Labs Lab 08/12/15 1748 08/13/15 0238 08/14/15 0832 08/15/15 1300 08/15/15 2052 08/16/15 0332  NA 133* 131* 138 134* 132*  --   K 4.3 4.5 4.1 4.1 4.2  --   CL 96* 96*  --  98* 94*  --   CO2 24 25  --  25 26  --   GLUCOSE 122* 139* 93 88 94  --   BUN 28* 30*  --  28* 29*  --   CREATININE 6.57* 7.40*  --  7.42* 7.97*  --   CALCIUM 9.5 8.7*  --  7.5* 7.8*  --   MG  --   --   --  1.7  --  1.8  AST 21  --   --   --   --   --   ALT 19  --   --   --   --   --   ALKPHOS 166*  --   --   --   --   --   BILITOT 1.3*  --   --   --   --   --    ------------------------------------------------------------------------------------------------------------------ estimated creatinine clearance is 10.2 mL/min (by C-G formula based on Cr of  7.97). ------------------------------------------------------------------------------------------------------------------  Recent Labs  08/16/15 0332  HGBA1C 6.6*   ------------------------------------------------------------------------------------------------------------------ No results for input(s): CHOL, HDL, LDLCALC, TRIG, CHOLHDL, LDLDIRECT in the last 72 hours. ------------------------------------------------------------------------------------------------------------------  Recent Labs  08/16/15 0953  TSH 1.559   ------------------------------------------------------------------------------------------------------------------ No results for input(s): VITAMINB12, FOLATE, FERRITIN, TIBC, IRON, RETICCTPCT in the last 72 hours.  Coagulation profile  Recent Labs Lab 08/12/15 2250  INR 1.28    No results for input(s): DDIMER in the last 72 hours.  Cardiac Enzymes No results for input(s): CKMB, TROPONINI, MYOGLOBIN in the last 168 hours.  Invalid input(s): CK ------------------------------------------------------------------------------------------------------------------ Invalid input(s): POCBNP    Latondra Gebhart D.O. on 08/17/2015 at 10:35 AM  Between 7am to 7pm - Pager - [phone]  After 7pm go to [email or web address] - password [email or web address]  And look for the night coverage person covering for me after hours  [facility] Office  [phone]

## 2015-08-17 NOTE — Progress Notes (Signed)
  [facility] Progress Note  Subjective  - Feels better today.   Objective Filed Vitals:   08/17/15 0427  BP: 77/43  Pulse:   Temp: 98.6 F (37 C)  Resp:     Intake/Output Summary (Last 24 hours) at 08/17/15 0750 Last data filed at 08/17/15 0130  Gross per 24 hour  Intake    520 ml  Output   2100 ml  Net  -1580 ml   Left AVG with audible bruit. Incisions healing well. No erythema or purulence. Bandages on lateral cannulation sites.   Assessment/Planning: 54 y.o. female is s/p: revision of left AV thigh graft.  3 Days Post-Op   Graft patent.  AVG was cannulated on the NEW lateral segment yesterday at HD. No bleeding issues fortunately.  Stick only the MEDIAL side for HD.  Afebrile.  Follow up with Dr. Scot Dock prn.   Alvia Grove 08/17/2015 7:50 AM --  Laboratory CBC    Component Value Date/Time   WBC 4.7 08/16/2015 0332   HGB 9.7* 08/16/2015 0332   HCT 32.6* 08/16/2015 0332   PLT 159 08/16/2015 0332    BMET    Component Value Date/Time   NA 132* 08/15/2015 2052   K 4.2 08/15/2015 2052   CL 94* 08/15/2015 2052   CO2 26 08/15/2015 2052   GLUCOSE 94 08/15/2015 2052   BUN 29* 08/15/2015 2052   CREATININE 7.97* 08/15/2015 2052   CALCIUM 7.8* 08/15/2015 2052   GFRNONAA 5* 08/15/2015 2052   GFRAA 6* 08/15/2015 2052    COAG Lab Results  Component Value Date   INR 1.28 08/12/2015   INR 1.10 03/26/2013   INR 1.05 05/30/2011   No results found for: PTT  Antibiotics Anti-infectives    Start     Dose/Rate Route Frequency Ordered Stop   08/13/15 1200  vancomycin (VANCOCIN) IVPB 1000 mg/200 mL premix     1,000 mg 200 mL/hr over 60 Minutes Intravenous Every T-Th-Sa (Hemodialysis) 08/12/15 2158     08/13/15 1000  imipenem-cilastatin (PRIMAXIN) 250 mg in sodium chloride 0.9 % 100 mL IVPB  Status:  Discontinued     250 mg 200 mL/hr over 30 Minutes Intravenous Every 12 hours 08/12/15 2158 08/14/15 1034   08/12/15 1938   imipenem-cilastatin (PRIMAXIN) 500 mg in sodium chloride 0.9 % 100 mL IVPB     500 mg 200 mL/hr over 30 Minutes Intravenous  Once 08/12/15 1938 08/12/15 2152   08/12/15 1900  vancomycin (VANCOCIN) 2,000 mg in sodium chloride 0.9 % 500 mL IVPB     2,000 mg 250 mL/hr over 120 Minutes Intravenous  Once 08/12/15 1852 08/12/15 2146   08/12/15 1852  piperacillin-tazobactam (ZOSYN) IVPB 2.25 g  Status:  Discontinued     2.25 g 100 mL/hr over 30 Minutes Intravenous  Once 08/12/15 1852 08/12/15 2142       Virgina Jock, PA-C [facility] Office: [phone] Pager: [phone] 08/17/2015 7:50 AM

## 2015-08-17 NOTE — Care Management Important Message (Signed)
Important Message  Patient Details  Name: MIQUELA TURRELL MRN: [id number] Date of Birth: 06/17/1961   Medicare Important Message Given:  Yes-second notification given    Nathen May 08/17/2015, 11:54 AMImportant Message  Patient Details  Name: JALAYIA ELIZARDO MRN: [id number] Date of Birth: [birth year]   Medicare Important Message Given:  Yes-second notification given    Nathen May 08/17/2015, 11:53 AM

## 2015-08-18 ENCOUNTER — Other Ambulatory Visit: Payer: Self-pay | Admitting: Nephrology

## 2015-08-18 DIAGNOSIS — R7881 Bacteremia: Secondary | ICD-10-CM

## 2015-08-18 DIAGNOSIS — B958 Unspecified staphylococcus as the cause of diseases classified elsewhere: Secondary | ICD-10-CM

## 2015-08-18 DIAGNOSIS — G4733 Obstructive sleep apnea (adult) (pediatric): Secondary | ICD-10-CM

## 2015-08-18 LAB — CBC
HCT: 32.9 % — ABNORMAL LOW (ref 36.0–46.0)
Hemoglobin: 9.8 g/dL — ABNORMAL LOW (ref 12.0–15.0)
MCH: 28 pg (ref 26.0–34.0)
MCHC: 29.8 g/dL — ABNORMAL LOW (ref 30.0–36.0)
MCV: 94 fL (ref 78.0–100.0)
Platelets: 197 10*3/uL (ref 150–400)
RBC: 3.5 MIL/uL — ABNORMAL LOW (ref 3.87–5.11)
RDW: 14.2 % (ref 11.5–15.5)
WBC: 4.9 10*3/uL (ref 4.0–10.5)

## 2015-08-18 LAB — GLUCOSE, CAPILLARY
Glucose-Capillary: 131 mg/dL — ABNORMAL HIGH (ref 65–99)
Glucose-Capillary: 83 mg/dL (ref 65–99)
Glucose-Capillary: 90 mg/dL (ref 65–99)

## 2015-08-18 LAB — TISSUE CULTURE

## 2015-08-18 LAB — BASIC METABOLIC PANEL WITH GFR
Anion gap: 11 (ref 5–15)
BUN: 25 mg/dL — ABNORMAL HIGH (ref 6–20)
CO2: 24 mmol/L (ref 22–32)
Calcium: 8.9 mg/dL (ref 8.9–10.3)
Chloride: 99 mmol/L — ABNORMAL LOW (ref 101–111)
Creatinine, Ser: 8.2 mg/dL — ABNORMAL HIGH (ref 0.44–1.00)
GFR calc Af Amer: 6 mL/min — ABNORMAL LOW
GFR calc non Af Amer: 5 mL/min — ABNORMAL LOW
Glucose, Bld: 114 mg/dL — ABNORMAL HIGH (ref 65–99)
Potassium: 3.9 mmol/L (ref 3.5–5.1)
Sodium: 134 mmol/L — ABNORMAL LOW (ref 135–145)

## 2015-08-18 MED ORDER — HEPARIN SODIUM (PORCINE) 1000 UNIT/ML DIALYSIS: 1000.0000 [IU] | INTRAMUSCULAR | Status: DC | PRN

## 2015-08-18 MED ORDER — HEPARIN SODIUM (PORCINE) 1000 UNIT/ML DIALYSIS: 2000.0000 [IU] | INTRAMUSCULAR | Status: DC | PRN

## 2015-08-18 MED ORDER — VANCOMYCIN HCL IN DEXTROSE 1-5 GM/200ML-% IV SOLN: 1000.0000 mg | INTRAVENOUS | Status: DC

## 2015-08-18 MED ORDER — LIDOCAINE HCL (PF) 1 % IJ SOLN: 5.0000 mL | INTRAMUSCULAR | Status: DC | PRN

## 2015-08-18 MED ORDER — MIDODRINE HCL 10 MG PO TABS: 10.0000 mg | ORAL_TABLET | Freq: Three times a day (TID) | ORAL | Status: DC

## 2015-08-18 MED ORDER — SODIUM CHLORIDE 0.9 % IV SOLN: 100.0000 mL | INTRAVENOUS | Status: DC | PRN

## 2015-08-18 MED ORDER — ALTEPLASE 2 MG IJ SOLR
2.0000 mg | Freq: Once | INTRAMUSCULAR | Status: DC | PRN
  Filled 2015-08-18: qty 2

## 2015-08-18 MED ORDER — PENTAFLUOROPROP-TETRAFLUOROETH EX AERO: 1.0000 "application " | INHALATION_SPRAY | CUTANEOUS | Status: DC | PRN

## 2015-08-18 MED ORDER — INSULIN GLARGINE 100 UNIT/ML ~~LOC~~ SOLN: 8.0000 [IU] | Freq: Every day | SUBCUTANEOUS | Status: DC

## 2015-08-18 MED ORDER — LIDOCAINE-PRILOCAINE 2.5-2.5 % EX CREA
1.0000 "application " | TOPICAL_CREAM | CUTANEOUS | Status: DC | PRN
  Filled 2015-08-18: qty 5

## 2015-08-18 MED ORDER — DOXERCALCIFEROL 4 MCG/2ML IV SOLN
INTRAVENOUS | Status: AC
  Administered 2015-08-18: 2 ug via INTRAVENOUS
  Filled 2015-08-18: qty 2

## 2015-08-18 NOTE — Progress Notes (Signed)
Discussed discharge instructions and medications with patient and family. Both verbalized understanding with all questions answered. VSS. Pt discharged home with family.  Ashlin Kreps,RN  

## 2015-08-18 NOTE — Progress Notes (Addendum)
[facility] [place]  Subjective  - Doing well this am.   Objective 97/59 78 97.6 F (36.4 C) (Oral) 19 95%  Intake/Output Summary (Last 24 hours) at 08/18/15 0749 Last data filed at 08/18/15 0000  Gross per 24 hour  Intake   1515 ml  Output      0 ml  Net   1515 ml    Left thigh graft incisions are healing well. distally  left foot is warm and well perfused Patient stood and moved to the bedside chair with min. asistants  Assessment/Planning: 54 y.o. female is s/p: revision of left AV thigh graft.  4 Days Post-Op   DO NOT STICK LATERAL LIMB OF GRAFT STICK MEDIAL GRAFT ONLY  Theda Sers, EMMA [facility] 08/18/2015 7:49 AM --  Laboratory Lab Results:  Recent Labs  08/17/15 1024 08/18/15 0257  WBC 4.6 4.9  HGB 9.8* 9.8*  HCT 32.3* 32.9*  PLT 165 197   BMET  Recent Labs  08/15/15 2052 08/17/15 1024  NA 132* 136  K 4.2 4.4  CL 94* 99*  CO2 26 27  GLUCOSE 94 81  BUN 29* 18  CREATININE 7.97* 6.73*  CALCIUM 7.8* 8.1*    COAG Lab Results  Component Value Date   INR 1.28 08/12/2015   INR 1.10 03/26/2013   INR 1.05 05/30/2011   No results found for: PTT  Agree with above. I will arrange suture removal in 1 week  Deitra Mayo, MD, [place] [phone] Office: [phone]

## 2015-08-18 NOTE — Discharge Summary (Signed)
Physician Discharge Summary  [name] [id number] DOB: 06/09/1961 DOA: 08/12/2015  PCP: Barbette Merino, MD  Admit date: 08/12/2015 Discharge date: 08/18/2015  Time spent: 45 minutes  Recommendations for Outpatient Follow-up:  Patient will be discharged to home with home health PT.  Patient will need to follow up with primary care provider within one week of discharge, to discuss diabetes management.  Continue hemodialysis as scheduled.  Patient should continue medications as prescribed.  Patient should follow a renal/car modified with fluid restriction diet.   Discharge Diagnoses:  Septic shock secondary to MSSA bacteremia HD graft infection End-stage renal disease Acute hypoxic respiratory failure secondary to hypoventilation versus COPD Diabetes mellitus with episodes of hypoglycemia  Discharge Condition: Stable  Diet recommendation: Renal/car modified 1200 mL fluid restriction per day  [facility] Weights   08/16/15 1430 08/18/15 0854 08/18/15 1306  Weight: 127.8 kg (281 lb 12 oz) 125.4 kg (276 lb 7.3 oz) 123.4 kg (272 lb 0.8 oz)    History of present illness:  on 08/12/2015 by Dr. Jana Hakim [name] is a 54 y.o. female with a history of ESRD on HD since 1992 who presents to the ED with complaints of Fever Chills and Myalgia x 2 days with headache, nausea and vomiting. She denies any neck stiffness. She reports occasional Cough that is nonproductive. She reports SOB and in the ED she was found to have hypoxia to 85% and was placed on 3 liters NCO2 and improved to 94%. She had a temperature of 102 in the ED. Chest Xray was negative for any acute findings, and a UA and Blood Cultures were ordered. She was placed on IV Vancomycin and Primaxin and referred for admission.  Hospital Course:  Septic shock secondary to MSSA bacteremia -Patient does have baseline hypotension, systolics of [id number]. -Continue Midodrine. -ACTH stimulation test pending -Continue  vancomycin -Infectious disease consulted, recommended 6 weeks of vancomycin with HD -Repeat blood cultures 08/14/2015 to date.  HD graft infection -Debrided on 9/4 -Vascular surgery following  End-stage renal disease -Nephrology consultation appreciated, continue HD TTS  Acute hypoxic respiratory failure secondary to hypoventilation postop/COPD -Appears to be stable, no wheezing on exams -Continue bronchodilators as needed  Diabetes mellitus with episodes of hypoglycemia -Hemoglobin A1c 6.6 -Lantus held -Continue insulin sliding scale CBG monitoring  Procedures/ Events 9/2 admitted w/ working dx of sepsis from infected thigh AVG. abx started.  9/3 seen by vascular.  9/4 to OR as blood cultures were positive and no other clear source other than AVG. Infected are removed and AVG revised. Hypotensive after surgery. PCCM asked to assume care in ICU  Consults  Nephrology Vascular surgery PCCM  Discharge Exam: Filed Vitals:   08/18/15 1306  BP: 105/57  Pulse: 76  Temp: 97.5 F (36.4 C)  Resp: 22   Exam  General: Well developed, well nourished, NAD, appears stated age  HEENT: NCAT, mucous membranes moist.   Cardiovascular: S1 S2 auscultated, RRR, soft murmur.  Respiratory: Clear to auscultation bilaterally with equal chest rise  Abdomen: Soft, obese, nontender, nondistended, + bowel sounds  Extremities: warm dry without cyanosis clubbing. Mild edema  Neuro: AAOx3, slow to respond, otherwise nonfocal  Discharge Instructions      Discharge Instructions    Discharge instructions    Complete by:  As directed   Patient will be discharged to home with home health PT.  Patient will need to follow up with primary care provider within one week of discharge, to discuss diabetes management.  Continue hemodialysis  as scheduled.  Patient should continue medications as prescribed.  Patient should follow a renal/car modified with fluid restriction diet.              Medication List    STOP taking these medications        clonazePAM 1 MG tablet  Commonly known as:  KLONOPIN     methylPREDNIsolone 4 MG tablet  Commonly known as:  MEDROL DOSPACK      TAKE these medications        albuterol 108 (90 BASE) MCG/ACT inhaler  Commonly known as:  PROVENTIL HFA;VENTOLIN HFA  Inhale 2 puffs into the lungs every 6 (six) hours as needed. For shortness of breath.     B-complex with vitamin C tablet  Take 1 tablet by mouth daily.     calcium acetate 667 MG capsule  Commonly known as:  PHOSLO  Take 1,334 mg by mouth 3 (three) times daily with meals.     insulin glargine 100 UNIT/ML injection  Commonly known as:  LANTUS  Inject 0.08 mLs (8 Units total) into the skin at bedtime.     metoCLOPramide 5 MG tablet  Commonly known as:  REGLAN  Take 5 mg by mouth at bedtime.     midodrine 10 MG tablet  Commonly known as:  PROAMATINE  Take 1 tablet (10 mg total) by mouth 3 (three) times daily with meals.     NOVOLOG FLEXPEN 100 UNIT/ML injection  Generic drug:  insulin aspart  Inject 12-15 Units into the skin 3 (three) times daily before meals. Per sliding scale     oxyCODONE 5 MG immediate release tablet  Commonly known as:  Oxy IR/ROXICODONE  Take 1 tablet (5 mg total) by mouth every 4 (four) hours as needed for severe pain.     tiotropium 18 MCG inhalation capsule  Commonly known as:  SPIRIVA  Place 18 mcg into inhaler and inhale daily.     TUMS ULTRA 1000 400 MG tablet  Generic drug:  calcium elemental as carbonate  Chew 1,000 mg by mouth daily.     vancomycin 1 GM/200ML Soln  Commonly known as:  VANCOCIN  Inject 200 mLs (1,000 mg total) into the vein Every Tuesday,Thursday,and Saturday with dialysis.       Allergies  Allergen Reactions  . Amoxicillin Anaphylaxis and Other (See Comments)    Throat closes and gets sweaty.  . Iohexol Other (See Comments)     Desc: scratchy/itchy throat/itching on back / nausea    Follow-up Information     Follow up with GARBA,LAWAL, MD. Schedule an appointment as soon as possible for a visit in 1 week.   Specialty:  Internal Medicine   Why:  Hospital follow up   Contact information:   [place] [place] [address] [phone]        The results of significant diagnostics from this hospitalization (including imaging, microbiology, ancillary and laboratory) are listed below for reference.    Significant Diagnostic Studies: Dg Chest Port 1 View  08/15/2015   CLINICAL DATA:  54 year old female with hypoxia. Initial encounter.  EXAM: PORTABLE CHEST - 1 VIEW  COMPARISON:  08/12/2015 and earlier.  FINDINGS: Portable AP semi upright view at 0819 hours. Stable lung volumes. Stable cardiac size and mediastinal contours. No pneumothorax. No pleural effusion or consolidation. Stable pulmonary vascularity without overt edema. Visualized tracheal air column is within normal limits.  IMPRESSION: Stable pulmonary vascular congestion without overt edema.   Electronically Signed   By:  Genevie Ann M.D.   On: 08/15/2015 08:48   Dg Chest Port 1 View  08/12/2015   CLINICAL DATA:  Headache.  Chills.  Nausea and vomiting for 1 day.  EXAM: PORTABLE CHEST - 1 VIEW  COMPARISON:  08/12/2014  FINDINGS: Vascular stent over the right mediastinum, likely in the SVC.  Thoracic spondylosis. Heart size within normal limits. Upper zone pulmonary vascular prominence, indeterminate due to the semi recumbent positioning. No overt edema. The lungs appear otherwise clear.  IMPRESSION: 1. No edema. Vascular stent along the right mediastinum presumably in the SVC. 2. Thoracic spondylosis.   Electronically Signed   By: Van Clines M.D.   On: 08/12/2015 18:32   Dg Abd 2 Views  08/13/2015   CLINICAL DATA:  End-stage renal disease on hemodialysis. Abdominal pain.  EXAM: ABDOMEN - 2 VIEW  COMPARISON:  None.  FINDINGS: No dilated loops of large or small bowel. Gas and stool in the rectum. No pathologic calcifications. No  organomegaly. No acute osseous abnormality  IMPRESSION: No acute abdominal findings   Electronically Signed   By: Suzy Bouchard M.D.   On: 08/13/2015 13:57    Microbiology: Recent Results (from the past 240 hour(s))  Blood culture (routine x 2)     Status: None   Collection Time: 08/12/15  5:36 PM  Result Value Ref Range Status   Specimen Description BLOOD LEFT ARM  Final   Special Requests BOTTLES DRAWN AEROBIC AND ANAEROBIC 5CC  Final   Culture  Setup Time   Final    AEROBIC BOTTLE ONLY GRAM POSITIVE COCCI IN CLUSTERS CRITICAL RESULT CALLED TO, READ BACK BY AND VERIFIED WITH: K.GENGLER,RN 0032 08/14/15 M.[place] BY R.[place]ENE    Culture STAPHYLOCOCCUS AUREUS  Final   Report Status 08/15/2015 FINAL  Final   Organism ID, Bacteria STAPHYLOCOCCUS AUREUS  Final      Susceptibility   Staphylococcus aureus - MIC*    CIPROFLOXACIN >=8 RESISTANT Resistant     ERYTHROMYCIN >=8 RESISTANT Resistant     GENTAMICIN <=0.5 SENSITIVE Sensitive     OXACILLIN 0.5 SENSITIVE Sensitive     TETRACYCLINE <=1 SENSITIVE Sensitive     VANCOMYCIN 1 SENSITIVE Sensitive     TRIMETH/SULFA <=10 SENSITIVE Sensitive     CLINDAMYCIN <=0.25 SENSITIVE Sensitive     RIFAMPIN <=0.5 SENSITIVE Sensitive     Inducible Clindamycin NEGATIVE Sensitive     * STAPHYLOCOCCUS AUREUS  Blood culture (routine x 2)     Status: None   Collection Time: 08/12/15 10:50 PM  Result Value Ref Range Status   Specimen Description BLOOD RIGHT ARM  Final   Special Requests BOTTLES DRAWN AEROBIC AND ANAEROBIC 6CC  Final   Culture NO GROWTH 5 DAYS  Final   Report Status 08/17/2015 FINAL  Final  MRSA PCR Screening     Status: Abnormal   Collection Time: 08/12/15 11:44 PM  Result Value Ref Range Status   MRSA by PCR POSITIVE (A) NEGATIVE Final    Comment:        The GeneXpert MRSA Assay (FDA approved for NASAL specimens only), is one component of a comprehensive MRSA colonization surveillance program. It is not intended  to diagnose MRSA infection nor to guide or monitor treatment for MRSA infections. RESULT CALLED TO, READ BACK BY AND VERIFIED WITH: K GENGLER@0219  08/13/15   Wound culture     Status: None   Collection Time: 08/13/15 11:09 AM  Result Value Ref Range Status   Specimen Description WOUND LEFT THIGH  GRAFT  Final   Special Requests NONE  Final   Gram Stain   Final    NO WBC SEEN NO SQUAMOUS EPITHELIAL CELLS SEEN RARE GRAM POSITIVE COCCI IN PAIRS Performed at [facility]    Culture   Final    ABUNDANT STAPHYLOCOCCUS AUREUS Note: RIFAMPIN AND GENTAMICIN SHOULD NOT BE USED AS SINGLE DRUGS FOR TREATMENT OF STAPH INFECTIONS. This organism DOES NOT demonstrate inducible Clindamycin resistance in vitro. Performed at [facility]    Report Status 08/16/2015 FINAL  Final   Organism ID, Bacteria STAPHYLOCOCCUS AUREUS  Final      Susceptibility   Staphylococcus aureus - MIC*    CLINDAMYCIN <=0.25 SENSITIVE Sensitive     ERYTHROMYCIN >=8 RESISTANT Resistant     GENTAMICIN <=0.5 SENSITIVE Sensitive     LEVOFLOXACIN 4 INTERMEDIATE Intermediate     OXACILLIN 0.5 SENSITIVE Sensitive     RIFAMPIN <=0.5 SENSITIVE Sensitive     TRIMETH/SULFA <=10 SENSITIVE Sensitive     VANCOMYCIN 1 SENSITIVE Sensitive     TETRACYCLINE <=1 SENSITIVE Sensitive     MOXIFLOXACIN 1 INTERMEDIATE Intermediate     * ABUNDANT STAPHYLOCOCCUS AUREUS  Wound culture     Status: None (Preliminary result)   Collection Time: 08/14/15 10:36 AM  Result Value Ref Range Status   Specimen Description WOUND LEFT THIGH  Final   Special Requests POF VANC AND ZOSYN  SPEC A ON SWAB  Final   Gram Stain   Final    NO WBC SEEN NO SQUAMOUS EPITHELIAL CELLS SEEN NO ORGANISMS SEEN Performed at [facility]    Culture   Final    RARE STAPHYLOCOCCUS AUREUS Note: RIFAMPIN AND GENTAMICIN SHOULD NOT BE USED AS SINGLE DRUGS FOR TREATMENT OF STAPH INFECTIONS. Performed at [facility]    Report Status  PENDING  Incomplete  Tissue culture     Status: None   Collection Time: 08/14/15 10:42 AM  Result Value Ref Range Status   Specimen Description TISSUE LEFT THIGH  Final   Special Requests POF ON VANC AND ZOSYN  SPEC B  Final   Gram Stain   Final    RARE WBC PRESENT, PREDOMINANTLY PMN NO ORGANISMS SEEN Performed at [facility]    Culture   Final    RARE STAPHYLOCOCCUS AUREUS Note: RIFAMPIN AND GENTAMICIN SHOULD NOT BE USED AS SINGLE DRUGS FOR TREATMENT OF STAPH INFECTIONS. This organism DOES NOT demonstrate inducible Clindamycin resistance in vitro. Performed at [facility]    Report Status 08/18/2015 FINAL  Final   Organism ID, Bacteria STAPHYLOCOCCUS AUREUS  Final      Susceptibility   Staphylococcus aureus - MIC*    CLINDAMYCIN <=0.25 SENSITIVE Sensitive     ERYTHROMYCIN >=8 RESISTANT Resistant     GENTAMICIN <=0.5 SENSITIVE Sensitive     LEVOFLOXACIN 4 INTERMEDIATE Intermediate     OXACILLIN 0.5 SENSITIVE Sensitive     RIFAMPIN <=0.5 SENSITIVE Sensitive     TRIMETH/SULFA <=10 SENSITIVE Sensitive     VANCOMYCIN 1 SENSITIVE Sensitive     TETRACYCLINE <=1 SENSITIVE Sensitive     MOXIFLOXACIN 2 RESISTANT Resistant     * RARE STAPHYLOCOCCUS AUREUS  Culture, blood (routine x 2)     Status: None (Preliminary result)   Collection Time: 08/14/15  8:00 PM  Result Value Ref Range Status   Specimen Description BLOOD HEMODIALYSIS CATHETER  Final   Special Requests BOTTLES DRAWN AEROBIC AND ANAEROBIC 10ML  Final   Culture NO GROWTH  4 DAYS  Final   Report Status PENDING  Incomplete  Culture, blood (routine x 2)     Status: None (Preliminary result)   Collection Time: 08/14/15  9:00 PM  Result Value Ref Range Status   Specimen Description BLOOD LEFT FEMORAL ARTERY HEMODIALYSIS CATHETER  Final   Special Requests BOTTLES DRAWN AEROBIC AND ANAEROBIC 10CC  Final   Culture NO GROWTH 4 DAYS  Final   Report Status PENDING  Incomplete     Labs: Basic Metabolic  Panel:  Recent Labs Lab 08/13/15 0238 08/14/15 0832 08/15/15 1300 08/15/15 2052 08/16/15 0332 08/17/15 1024 08/18/15 0900  NA 131* 138 134* 132*  --  136 134*  K 4.5 4.1 4.1 4.2  --  4.4 3.9  CL 96*  --  98* 94*  --  99* 99*  CO2 25  --  25 26  --  27 24  GLUCOSE 139* 93 88 94  --  81 114*  BUN 30*  --  28* 29*  --  18 25*  CREATININE 7.40*  --  7.42* 7.97*  --  6.73* 8.20*  CALCIUM 8.7*  --  7.5* 7.8*  --  8.1* 8.9  MG  --   --  1.7  --  1.8  --   --   PHOS  --   --  4.8* 5.2* 5.1*  --   --    Liver Function Tests:  Recent Labs Lab 08/12/15 1748 08/15/15 2052  AST 21  --   ALT 19  --   ALKPHOS 166*  --   BILITOT 1.3*  --   PROT 8.5*  --   ALBUMIN 4.0 3.5    Recent Labs Lab 08/13/15 1036  LIPASE 33   No results for input(s): AMMONIA in the last 168 hours. CBC:  Recent Labs Lab 08/13/15 0238 08/14/15 0832 08/15/15 0236 08/16/15 0332 08/17/15 1024 08/18/15 0257  WBC 6.0  --  7.7 4.7 4.6 4.9  HGB 11.4* 12.9 11.3* 9.7* 9.8* 9.8*  HCT 36.0 38.0 38.1 32.6* 32.3* 32.9*  MCV 92.3  --  95.3 93.7 95.3 94.0  PLT 155  --  182 159 165 197   Cardiac Enzymes: No results for input(s): CKTOTAL, CKMB, CKMBINDEX, TROPONINI in the last 168 hours. BNP: BNP (last 3 results) No results for input(s): BNP in the last 8760 hours.  ProBNP (last 3 results) No results for input(s): PROBNP in the last 8760 hours.  CBG:  Recent Labs Lab 08/17/15 1559 08/17/15 2100 08/18/15 0016 08/18/15 0352 08/18/15 0748  GLUCAP 91 96 90 83 131*       Signed:  Vayden Weinand  [facility] 08/18/2015, 2:12 PM

## 2015-08-18 NOTE — Progress Notes (Signed)
[facility] Progress Note  Assessment/Plan: 1. MSSA sepsis 2/2 infected graft s/p resection of lateral side - cannulating medially - continue Vanco (Ancef allergy) for 6 weeks total at her HD center - repeat BC neg to date; also MRSA PCR + 2. ESRD - TTS starting on 4 K bath K yesterday 4.4 - change to 3 K bath - normally runs 4.75 hours for adequate HD and chronic low BP; HD 4 hour today 3. Anemia - Hgb 9.8 - no ESA/Fe since last November, should be able to come up on its own; decline 2/2 infx surgery 4. Secondary hyperparathyroidism - Ca lower than usual, on hectorol, I suspect she has not been getting adequate Ca in bath - normally on 2.5 bath - change to 2.5 today labs pending 5. Hypotension -  Acute on chronic, much better today, on midodrine as previous 6. Nutrition - renal carb mod alb 3.5 7. DM 8. Hx PE - on chronic lovenox at home, heparin here 9. Disp - Anticipate D/c today  Myriam Jacobson, PA-C [facility] [phone] 08/18/2015,8:48 AM  LOS: 6 days   Pt seen, examined and agree w A/P as above. BP's better now, ok for dc after HD today .  Kelly Splinter MD pager [phone]    cell [phone] 08/18/2015, 11:50 AM    Subjective:   No c/o; up walking yesterday without dizziness.   Objective Filed Vitals:   08/17/15 1603 08/17/15 2013 08/18/15 0016 08/18/15 0353  BP: 88/65 96/41 101/53 97/59  Pulse: 70 71 70 78  Temp:  97.7 F (36.5 C) 98 F (36.7 C) 97.6 F (36.4 C)  TempSrc:  Oral Axillary Oral  Resp: 11 12 18 19   Height:      Weight:      SpO2: 99% 93% 93% 95%   Physical Exam HD being initiated goal 2.5 General: NAD Heart: RRR Lungs: no rales anteriorly Abdomen: obese soft NT + BS Extremities: 1 + LE edema Dialysis Access: left thigh graft + bruit, incision sites healing well  Dialysis Orders: TTS [facility] 4.45 hr 2 K 2.5 Ca 180 EDW 122.5 no profile 36 degrees thigh graft heparin 3000 with 1000 mid tmt, Hectorol 2 no ESA or  Fe Recent labs; Hgb 11.7 Ca 10 (usually less) P ok iPTH 201   Additional Objective Labs: Basic Metabolic Panel:  Recent Labs Lab 08/15/15 1300 08/15/15 2052 08/16/15 0332 08/17/15 1024  NA 134* 132*  --  136  K 4.1 4.2  --  4.4  CL 98* 94*  --  99*  CO2 25 26  --  27  GLUCOSE 88 94  --  81  BUN 28* 29*  --  18  CREATININE 7.42* 7.97*  --  6.73*  CALCIUM 7.5* 7.8*  --  8.1*  PHOS 4.8* 5.2* 5.1*  --    Liver Function Tests:  Recent Labs Lab 08/12/15 1748 08/15/15 2052  AST 21  --   ALT 19  --   ALKPHOS 166*  --   BILITOT 1.3*  --   PROT 8.5*  --   ALBUMIN 4.0 3.5    Recent Labs Lab 08/13/15 1036  LIPASE 33   CBC:  Recent Labs Lab 08/13/15 0238  08/15/15 0236 08/16/15 0332 08/17/15 1024 08/18/15 0257  WBC 6.0  --  7.7 4.7 4.6 4.9  HGB 11.4*  < > 11.3* 9.7* 9.8* 9.8*  HCT 36.0  < > 38.1 32.6* 32.3* 32.9*  MCV 92.3  --  95.3 93.7 95.3 94.0  PLT 155  --  182 159 165 197  < > = values in this interval not displayed. Blood Culture    Component Value Date/Time   SDES BLOOD LEFT FEMORAL ARTERY HEMODIALYSIS CATHETER 08/14/2015 2100   SPECREQUEST BOTTLES DRAWN AEROBIC AND ANAEROBIC 10CC 08/14/2015 2100   CULT NO GROWTH 3 DAYS 08/14/2015 2100   REPTSTATUS PENDING 08/14/2015 2100   CBG:  Recent Labs Lab 08/17/15 1559 08/17/15 2100 08/18/15 0016 08/18/15 0352 08/18/15 0748  GLUCAP 91 96 90 83 131*  Medications: . dextrose 5 % and 0.45% NaCl 50 mL/hr at 08/17/15 0550  . phenylephrine (NEO-SYNEPHRINE) Adult infusion Stopped (08/15/15 1159)   . antiseptic oral rinse  7 mL Mouth Rinse BID  . calcium acetate  1,334 mg Oral TID WC  . calcium carbonate  1,000 mg Oral QAC supper  . doxercalciferol  2 mcg Intravenous Q T,Th,Sa-HD  . feeding supplement (NEPRO CARB STEADY)  237 mL Oral TID BM  . heparin  3,000 Units Dialysis Once in dialysis  . heparin  5,000 Units Subcutaneous 3 times per day  . insulin aspart  0-9 Units Subcutaneous TID WC  . midodrine   10 mg Oral TID WC  . multivitamin  1 tablet Oral QHS  . mupirocin ointment  1 application Nasal BID  . sodium chloride  3 mL Intravenous Q12H  . vancomycin  1,000 mg Intravenous Q T,Th,Sa-HD

## 2015-08-18 NOTE — Discharge Instructions (Signed)
Bacteremia °Bacteremia occurs when bacteria get in your blood. Normal blood does not usually have bacteria. Bacteremia is one way infections can spread from one part of the body to another. °CAUSES  °· Causes may include anything that allows bacteria to get into the body. Examples are: °¨ Catheters. °¨ Intravenous (IV) access tubes. °¨ Cuts or scrapes of the skin. °· Temporary bacteremia may occur during dental procedures, while brushing your teeth, or during a bowel movement. This rarely causes any symptoms or medical problems. °· Bacteria may also get in the bloodstream as a complication of a bacterial infection elsewhere. This includes infected wounds and bacterial infections of the: °¨ Lungs (pneumonia). °¨ Kidneys (pyelonephritis). °¨ Intestines (enteritis, colitis). °¨ Organs in the abdomen (appendicitis, cholecystitis, diverticulitis). °SYMPTOMS  °The body is usually able to clear small numbers of bacteria out of the blood quickly. Brief bacteremia usually does not cause problems.  °· Problems can occur if the bacteria start to grow in number or spread to other parts of the body. If the bacteria start growing, you may develop: °¨ Chills. °¨ Fever. °¨ Nausea. °¨ Vomiting. °¨ Sweating. °¨ Lightheadedness and low blood pressure. °¨ Pain. °· If bacteria start to grow in the linings around the brain, it is called meningitis. This can cause severe headaches, many other problems, and even death. °· If bacteria start to grow in a joint, it causes arthritis with painful joints. If bacteria start to grow in a bone, it is called osteomyelitis. °· Bacteria from the blood can also cause sores (abscesses) in many organs, such as the muscle, liver, spleen, lungs, brain, and kidneys. °DIAGNOSIS  °· This condition is diagnosed by cultures of the blood. °· Cultures may also be taken from other parts of the body that are thought to be causing the bacteremia. A small piece of tissue, fluid, or other product of the body is  sampled. The sample is then put on a growth plate to see if any bacteria grows. °· Other lab tests may be done and the results may be abnormal. °TREATMENT  °Treatment requires a stay in the hospital. You will be given antibiotic medicine through an IV access tube. °PREVENTION  °People with an increased risk of developing bacteremia or complications may be given antibiotics before certain procedures. Examples are: °· A person with a heart murmur or artificial heart valve, before having his or her teeth cleaned. °· Before having a surgical or other invasive procedure. °· Before having a bowel procedure. °Document Released: 09/09/2006 Document Revised: 02/18/2012 Document Reviewed: 06/21/2011 °ExitCare® Patient Information ©2015 [employer]. This information is not intended to replace advice given to you by your health care provider. Make sure you discuss any questions you have with your health care provider. ° °

## 2015-08-19 ENCOUNTER — Telehealth: Payer: Self-pay | Admitting: Vascular Surgery

## 2015-08-19 LAB — CULTURE, BLOOD (ROUTINE X 2)
CULTURE: NO GROWTH
Culture: NO GROWTH

## 2015-08-19 LAB — WOUND CULTURE: GRAM STAIN: NONE SEEN

## 2015-08-19 NOTE — Telephone Encounter (Signed)
-----   Message from Angelia Mould, MD sent at 08/18/2015  4:00 PM EDT ----- Regarding: f/u She will need a f/u visit in 1-2 weeks to have some sutures removed from her left thigh graft revision.  Deitra Mayo, MD, [place] [phone] Office: [phone]

## 2015-08-19 NOTE — Telephone Encounter (Signed)
Spoke with pt, dpm °

## 2015-09-05 ENCOUNTER — Encounter: Payer: Self-pay | Admitting: Family

## 2015-09-07 ENCOUNTER — Encounter: Payer: Self-pay | Admitting: Family

## 2015-09-07 ENCOUNTER — Ambulatory Visit (INDEPENDENT_AMBULATORY_CARE_PROVIDER_SITE_OTHER): Payer: Medicare Other | Admitting: Family

## 2015-09-07 VITALS — BP 109/77 | HR 87 | Temp 97.4°F | Ht 61.0 in | Wt 273.2 lb

## 2015-09-07 DIAGNOSIS — Z992 Dependence on renal dialysis: Secondary | ICD-10-CM

## 2015-09-07 DIAGNOSIS — N186 End stage renal disease: Secondary | ICD-10-CM

## 2015-09-07 DIAGNOSIS — Z4802 Encounter for removal of sutures: Secondary | ICD-10-CM

## 2015-09-07 NOTE — Progress Notes (Addendum)
    Postoperative Access Visit   History of Present Illness  [name] is a 54 y.o. year old female patient of Dr. Scot Dock who is s/p revision of left thigh AV graft with interposition 7 mm PTFE graft and removal of infected segment of left thigh AV graft on 08/14/15. She returns today for sutures removal.  Pt denies fever or chills, denies pain or cold feeling in her feet or legs. She dialyses T-TH-Sat; medial aspect of left thigh graft is being accessed for HD.   The patient's wounds are healed. The patient is able to complete their activities of daily living.    For VQI Use Only  PRE-ADM LIVING: Home  AMB STATUS: Ambulatory  Physical Examination Filed Vitals:   09/07/15 0849  BP: 109/77  Pulse: 87  Temp: 97.4 F (36.3 C)  TempSrc: Oral  Height: 5\' 1"  (1.549 m)  Weight: 273 lb 3.2 oz (123.923 kg)  SpO2: 93%   Body mass index is 51.65 kg/(m^2).  Left upper thigh: Incision is well healed, skin feels warm, feet movement are normal, sensation in toes is intact, palpable thrill, bruit can be auscultated.   Medical Decision Making  [name] is a 54 y.o. year old female who presents s/p revision of left thigh AV graft with interposition 7 mm PTFE graft and removal of infected segment of left thigh AV graft on 08/14/15. She returns today for sutures removal.  Pt denies fever or chills, denies pain or cold feeling in her feet or legs. She dialyses T-TH-Sat; medial aspect of left thigh graft is being accessed for HD. Marland Kitchen  The patient's revised portion of the access will be ready for use in 4 weeks after the 08/14/15 AV graft revision.  Pt advised to follow up with [place] as needed.  Thank you for allowing [place] to participate in this patient's care.  NICKEL, Sharmon Leyden, RN, MSN, FNP-C [facility] Office: [phone]  09/07/2015, 9:13 AM  Clinic MD: Scot Dock

## 2015-09-07 NOTE — Patient Instructions (Signed)
Vascular Access for Hemodialysis A vascular access is a connection between two blood vessels that allows blood to be easily removed from the body and returned to the body during hemodialysis. Hemodialysis is a procedure in which a machine outside of the body filters the blood. There are three types of vascular accesses:   Arteriovenous fistula. This is a connection between an artery and a vein (usually in the arm) that is made by sewing them together. Blood in the artery flows directly into the vein, causing it to get larger over time. This makes it easier for the vein to be used for hemodialysis. An arteriovenous fistula takes 1-6 months to develop after surgery.   Arteriovenous graft. This is a connection between an artery and a vein in the arm that is made with a tube. An arteriovenous graft can be used within 2-3 weeks of surgery.   Venous catheter. This is a thin, flexible tube that is placed in a large vein (usually in the neck, chest, or groin). A venous catheter for hemodialysis contains two tubes that come out of the skin. A venous catheter can be used right away. It is usually used as a temporary access if you need hemodialysis before a fistula or graft has developed. It may also be used as a permanent access if a fistula or graft cannot be created. WHICH TYPE OF ACCESS IS BEST FOR ME? The type of access that is best for you depends on the size and strength of your veins.  A fistula is usually the preferred type of access. It can last several years and is less likely than the other types of accesses to become infected or to cause blood clots within a blood vessel (thrombosis). However, a fistula is not an option for everyone. If your veins are not the right size, a graft may be used instead. Grafts require you to have strong veins. If your veins are not strong enough for a graft, a catheter may be used. Catheters are more likely than fistulas and grafts to become infected or to have thrombosis.   Sometimes, only one type of access is an option. Your health care provider will help you determine which type of access is best for you.  HOW IS A VASCULAR ACCESS USED? The way the access is used depends on the type of access:   If the access is a fistula or graft, two needles are inserted through the skin into the access before each hemodialysis session. Blood leaves the body through one of the needles and travels through a tube to the hemodialysis machine (dialyzer). It then flows through another tube and returns to the body through the second needle.   If the access is a catheter, one tube is connected directly to the tube that leads to the dialyzer and the other is connected to a tube that leads away from the dialyzer. Blood leaves the body through one tube and returns to the body through the other.  WHAT KIND OF PROBLEMS CAN OCCUR WITH VASCULAR ACCESSES?  Blood clots within a blood vessel (thrombosis). Thrombosis can lead to a narrowing of a blood vessel or tube (stenosis). If thrombosis occurs frequently, another access site may be created as a backup.   Infection.  These problems are most likely to occur with a venous catheter and least likely to occur with an arteriovenous fistula.  HOW DO I CARE FOR MY VASCULAR ACCESS? Wear a medical alert bracelet. This tells health care providers that you are   a dialysis patient in the case of an emergency and allows them to care for your veins appropriately. If you have a graft or fistula:   A "bruit" is a noise that is heard with a stethoscope and a "thrill" is a vibration felt over the graft or fistula. The presence of the bruit and thrill indicates that the access is working. You will be taught to feel for the thrill each day. If this is not felt, the access may be clotted. Call your health care provider.   You may use the arm where your vascular access is located freely after the site heals. Keep the following in mind:   Avoid pressure on  the arm.   Avoid lifting heavy objects with the arm.   Avoid sleeping on the arm.   Avoid wearing tight-sleeved shirts or jewelry around the graft or fistula.   Do not allow blood pressure monitoring or needle punctures on the side where the graft or fistula is located.   With permission from your health care provider, you may do exercises to help with blood flow through a fistula. These exercises involve squeezing a rubber ball or other soft objects as instructed. SEEK MEDICAL CARE IF:   Chills develop.   You have an oral temperature above 102 F (38.9 C).  Swelling around the graft or fistula gets worse.   New pain develops.   Pus or other fluid (drainage) is seen at the vascular access site.   Skin redness or red streaking is seen on the skin around, above, or below the vascular access. SEEK IMMEDIATE MEDICAL CARE IF:   Pain, numbness, or an unusual pale skin color develops in the hand on the side of your fistula.   Dizziness or weakness develops that you have not had before.   The vascular access has bleeding that cannot be easily controlled. Document Released: 02/16/2003 Document Revised: 04/12/2014 Document Reviewed: 04/14/2013 ExitCare Patient Information 2015 [employer]. This information is not intended to replace advice given to you by your health care provider. Make sure you discuss any questions you have with your health care provider.  

## 2015-10-04 DIAGNOSIS — E8770 Fluid overload, unspecified: Secondary | ICD-10-CM | POA: Insufficient documentation

## 2015-12-19 ENCOUNTER — Emergency Department (HOSPITAL_COMMUNITY): Payer: Medicare Other

## 2015-12-19 ENCOUNTER — Encounter (HOSPITAL_COMMUNITY): Payer: Self-pay | Admitting: *Deleted

## 2015-12-19 ENCOUNTER — Emergency Department (HOSPITAL_COMMUNITY)
Admission: EM | Admit: 2015-12-19 | Discharge: 2015-12-19 | Disposition: A | Discharge status: Home / Self Care | Payer: Medicare Other | Attending: Emergency Medicine | Admitting: Emergency Medicine

## 2015-12-19 DIAGNOSIS — Z793 Long term (current) use of hormonal contraceptives: Secondary | ICD-10-CM | POA: Insufficient documentation

## 2015-12-19 DIAGNOSIS — Z8701 Personal history of pneumonia (recurrent): Secondary | ICD-10-CM | POA: Insufficient documentation

## 2015-12-19 DIAGNOSIS — Z880 Allergy status to penicillin: Secondary | ICD-10-CM | POA: Diagnosis not present

## 2015-12-19 DIAGNOSIS — Z8719 Personal history of other diseases of the digestive system: Secondary | ICD-10-CM | POA: Insufficient documentation

## 2015-12-19 DIAGNOSIS — Y999 Unspecified external cause status: Secondary | ICD-10-CM | POA: Insufficient documentation

## 2015-12-19 DIAGNOSIS — S93401A Sprain of unspecified ligament of right ankle, initial encounter: Secondary | ICD-10-CM | POA: Insufficient documentation

## 2015-12-19 DIAGNOSIS — E119 Type 2 diabetes mellitus without complications: Secondary | ICD-10-CM | POA: Diagnosis not present

## 2015-12-19 DIAGNOSIS — S6992XA Unspecified injury of left wrist, hand and finger(s), initial encounter: Secondary | ICD-10-CM | POA: Diagnosis not present

## 2015-12-19 DIAGNOSIS — J449 Chronic obstructive pulmonary disease, unspecified: Secondary | ICD-10-CM | POA: Diagnosis not present

## 2015-12-19 DIAGNOSIS — S8991XA Unspecified injury of right lower leg, initial encounter: Secondary | ICD-10-CM | POA: Insufficient documentation

## 2015-12-19 DIAGNOSIS — Y9389 Activity, other specified: Secondary | ICD-10-CM | POA: Insufficient documentation

## 2015-12-19 DIAGNOSIS — M25512 Pain in left shoulder: Secondary | ICD-10-CM

## 2015-12-19 DIAGNOSIS — Z86711 Personal history of pulmonary embolism: Secondary | ICD-10-CM | POA: Insufficient documentation

## 2015-12-19 DIAGNOSIS — I9589 Other hypotension: Secondary | ICD-10-CM | POA: Diagnosis not present

## 2015-12-19 DIAGNOSIS — N186 End stage renal disease: Secondary | ICD-10-CM | POA: Insufficient documentation

## 2015-12-19 DIAGNOSIS — I120 Hypertensive chronic kidney disease with stage 5 chronic kidney disease or end stage renal disease: Secondary | ICD-10-CM | POA: Diagnosis not present

## 2015-12-19 DIAGNOSIS — M25561 Pain in right knee: Secondary | ICD-10-CM

## 2015-12-19 DIAGNOSIS — M25532 Pain in left wrist: Secondary | ICD-10-CM

## 2015-12-19 DIAGNOSIS — G4733 Obstructive sleep apnea (adult) (pediatric): Secondary | ICD-10-CM | POA: Insufficient documentation

## 2015-12-19 DIAGNOSIS — Z79899 Other long term (current) drug therapy: Secondary | ICD-10-CM | POA: Insufficient documentation

## 2015-12-19 DIAGNOSIS — Z862 Personal history of diseases of the blood and blood-forming organs and certain disorders involving the immune mechanism: Secondary | ICD-10-CM | POA: Insufficient documentation

## 2015-12-19 DIAGNOSIS — S4992XA Unspecified injury of left shoulder and upper arm, initial encounter: Secondary | ICD-10-CM | POA: Diagnosis not present

## 2015-12-19 DIAGNOSIS — Z9981 Dependence on supplemental oxygen: Secondary | ICD-10-CM | POA: Diagnosis not present

## 2015-12-19 DIAGNOSIS — W109XXA Fall (on) (from) unspecified stairs and steps, initial encounter: Secondary | ICD-10-CM | POA: Diagnosis not present

## 2015-12-19 DIAGNOSIS — Z992 Dependence on renal dialysis: Secondary | ICD-10-CM | POA: Insufficient documentation

## 2015-12-19 DIAGNOSIS — Y9289 Other specified places as the place of occurrence of the external cause: Secondary | ICD-10-CM | POA: Insufficient documentation

## 2015-12-19 MED ORDER — ACETAMINOPHEN 500 MG PO TABS
1000.0000 mg | ORAL_TABLET | Freq: Once | ORAL | Status: AC
  Administered 2015-12-19: 1000 mg via ORAL
  Filled 2015-12-19: qty 2

## 2015-12-19 NOTE — ED Provider Notes (Signed)
CSN: [id number]     Arrival date & time 12/19/15  1423 History   First MD Initiated Contact with Patient 12/19/15 1810     Chief Complaint  Patient presents with  . Fall  . Knee Pain     (Consider location/radiation/quality/duration/timing/severity/associated sxs/prior Treatment) HPI Comments: Landed on bottom, started falling and tried to catch self and felt pop right knee Then fell onto bottom No head trauma, no LOC Tried walking on it but makes it painful No other areas of trauma   Patient is a 55 y.o. female presenting with fall and knee pain.  Fall This is a new problem. Episode onset: this AM. The problem occurs constantly. The problem has not changed since onset.Pertinent negatives include no chest pain, no abdominal pain, no headaches and no shortness of breath. Nothing aggravates the symptoms. Nothing relieves the symptoms. She has tried nothing for the symptoms. The treatment provided no relief.  Knee Pain Associated symptoms: no back pain, no fever and no neck pain     Past Medical History  Diagnosis Date  . HTN (hypertension)   . Obesities, morbid ([place])   . GERD (gastroesophageal reflux disease)   . PE (pulmonary embolism) ~ 2010  . Superior vena cava syndrome     Archie Endo 03/26/2013  . Pneumonia     "now & once a long time ago" (03/26/2013)  . Shortness of breath     "just a little bit; at any time" (03/26/2013)  . OSA on CPAP   . Iron deficiency anemia   . Chronic hypotension   . COPD (chronic obstructive pulmonary disease) ([place])   . ESRD (end stage renal disease) on dialysis [facility])     "TTS; [place]; (03/25/2014)  . 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 Complication of anesthesia     confusion   Past Surgical History  Procedure Laterality Date  . Vascular surgery    . Thrombectomy and revision of arterioventous (av) goretex  graft Left ~ 12/2012    "thigh"   . Arteriovenous graft placement Left ~ 2011    "thigh" (4  . Carpal  tunnel release Right ~ 2011  . Carpal tunnel release Right 01/28/2015    Procedure: RIGHT CARPAL TUNNEL RELEASE;  Surgeon: Roseanne Kaufman, MD;  Location: [facility];  Service: Orthopedics;  Laterality: Right;  . Revision of arteriovenous goretex graft Left 08/14/2015    Procedure: REVISION OF ARTERIOVENOUS GORETEX GRAFT LEFT THIGH with  REMOVAL of segment of infected graft.;  Surgeon: Angelia Mould, MD;  Location: [facility];  Service: Vascular;  Laterality: Left;   Family History  Problem Relation Age of Onset  . Diabetes Mellitus II Mother   . Hypertension Mother   . Hypertension Father   . Diabetes Mellitus II Father    Social History  Substance Use Topics  . Smoking status: Never Smoker   . Smokeless tobacco: Never Used  . Alcohol Use: No   OB History    No data available     Review of Systems  Constitutional: Negative for fever.  HENT: Negative for sore throat.   Eyes: Negative for visual disturbance.  Respiratory: Negative for cough and shortness of breath.   Cardiovascular: Negative for chest pain.  Gastrointestinal: Negative for abdominal pain.  Genitourinary: Negative for difficulty urinating.  Musculoskeletal: Negative for back pain and neck pain.  Skin: Negative for rash.  Neurological: Negative for syncope and headaches.  Allergies  Amoxicillin and Iohexol  Home Medications   Prior to Admission medications   Medication Sig Start Date End Date Taking? Authorizing Provider  albuterol (PROVENTIL HFA;VENTOLIN HFA) 108 (90 BASE) MCG/ACT inhaler Inhale 2 puffs into the lungs every 6 (six) hours as needed. For shortness of breath. Patient taking differently: Inhale 2 puffs into the lungs every 6 (six) hours as needed for wheezing or shortness of breath.  03/27/14  Yes Adeline C Viyuoh, MD  B Complex-C (B-COMPLEX WITH VITAMIN C) tablet Take 1 tablet by mouth daily.   Yes Historical Provider, MD  calcium acetate (PHOSLO) 667 MG capsule Take 1,334 mg by mouth 3 (three)  times daily with meals.    Yes Historical Provider, MD  calcium elemental as carbonate (TUMS ULTRA 1000) 400 MG tablet Chew 1,000 mg by mouth daily.   Yes Historical Provider, MD  insulin aspart (NOVOLOG FLEXPEN) 100 UNIT/ML injection Inject 12-15 Units into the skin 3 (three) times daily before meals. Per sliding scale   Yes Historical Provider, MD  insulin glargine (LANTUS) 100 UNIT/ML injection Inject 0.08 mLs (8 Units total) into the skin at bedtime. 08/18/15  Yes Maryann Mikhail, DO  metoCLOPramide (REGLAN) 5 MG tablet Take 5 mg by mouth at bedtime.     Yes Historical Provider, MD  midodrine (PROAMATINE) 10 MG tablet Take 1 tablet (10 mg total) by mouth 3 (three) times daily with meals. 08/18/15  Yes Maryann Mikhail, DO  tiotropium (SPIRIVA) 18 MCG inhalation capsule Place 18 mcg into inhaler and inhale daily.   Yes Historical Provider, MD  oxyCODONE (OXY IR/ROXICODONE) 5 MG immediate release tablet Take 1 tablet (5 mg total) by mouth every 4 (four) hours as needed for severe pain. Patient not taking: Reported on 09/07/2015 01/28/15   Roseanne Kaufman, MD  vancomycin [facility]) 1 GM/200ML SOLN Inject 200 mLs (1,000 mg total) into the vein Every Tuesday,Thursday,and Saturday with dialysis. Patient not taking: Reported on 12/19/2015 08/18/15   Maryann Mikhail, DO   BP 92/62 mmHg  Pulse 84  Temp(Src) 98 F (36.7 C) (Oral)  Resp 16  SpO2 96% Physical Exam  Constitutional: She is oriented to person, place, and time. She appears well-developed and well-nourished. No distress.  HENT:  Head: Normocephalic and atraumatic.  Eyes: Conjunctivae and EOM are normal.  Neck: Normal range of motion.  Cardiovascular: Normal rate, regular rhythm, normal heart sounds and intact distal pulses.  Exam reveals no gallop and no friction rub.   No murmur heard. Pulmonary/Chest: Effort normal and breath sounds normal. No respiratory distress. She has no wheezes. She has no rales.  Abdominal: Soft. She exhibits no  distension. There is no tenderness. There is no guarding.  Musculoskeletal: She exhibits no edema.       Left shoulder: She exhibits tenderness and bony tenderness (diffuse). She exhibits normal range of motion, no deformity, no laceration and normal pulse.       Right knee: She exhibits normal range of motion. Tenderness found.       Right ankle: She exhibits decreased range of motion and swelling. She exhibits no ecchymosis, no deformity, no laceration and normal pulse. Tenderness. Lateral malleolus tenderness found.       Left ankle: She exhibits normal range of motion.  Neurological: She is alert and oriented to person, place, and time.  Skin: Skin is warm and dry. No rash noted. She is not diaphoretic. No erythema.  Nursing note and vitals reviewed.   ED Course  Procedures (including critical care  time) Labs Review Labs Reviewed - No data to display  Imaging Review Dg Wrist Complete Left  12/19/2015  CLINICAL DATA:  [place] down stairs today with left wrist pain. EXAM: LEFT WRIST - COMPLETE 3+ VIEW COMPARISON:  None. FINDINGS: No fracture or dislocation. The alignment and joint spaces are maintained. Scaphoid is intact. Small round soft tissue calcification about the dorsal ulnar soft tissues may be phlebolith. Soft tissues are otherwise normal. IMPRESSION: No fracture or dislocation of the left wrist. Electronically Signed   By: Jeb Levering M.D.   On: 12/19/2015 20:01   Dg Ankle Complete Right  12/19/2015  CLINICAL DATA:  Right ankle pain after falling down stairs today. EXAM: RIGHT ANKLE - COMPLETE 3+ VIEW COMPARISON:  None. FINDINGS: The mineralization and alignment are normal. There is no evidence of acute fracture or dislocation. The joint spaces are maintained. There is prominent dorsal talar beaking. There is also prominent lateral soft tissue swelling. Vascular calcifications suggest underlying diabetes. IMPRESSION: No acute osseous findings.  Lateral soft tissue swelling.  Electronically Signed   By: Richardean Sale M.D.   On: 12/19/2015 20:01   Dg Shoulder Left  12/19/2015  CLINICAL DATA:  Fall down stairs today, now with left shoulder pain. EXAM: LEFT SHOULDER - 2+ VIEW COMPARISON:  None. FINDINGS: The acromioclavicular joint is congruent with mild degenerative change. Glenohumeral alignment is maintained. There is minimal subcortical change involving the greater tuberosity. Surgical clips in the axilla. Detailed evaluation limited by soft tissue attenuation from large body habitus. IMPRESSION: Subcortical change at the greater tuberosity, favor chronic and secondary to rotator cuff pathology, nondisplaced greater tuberosity fracture could have a similar appearance but is felt less likely. If physical exam is unreliable for point tenderness, CT may be considered. Please note soft tissue attenuation from large body habitus partially obscures evaluation. Electronically Signed   By: Jeb Levering M.D.   On: 12/19/2015 20:04   Dg Knee Complete 4 Views Right  12/19/2015  CLINICAL DATA:  Fall. EXAM: RIGHT KNEE - COMPLETE 4+ VIEW COMPARISON:  No prior. FINDINGS: No acute bony or joint abnormality identified. No evidence of fracture or dislocation . IMPRESSION: No acute abnormality. Electronically Signed   By: Marcello Moores  Register   On: 12/19/2015 15:36   I have personally reviewed and evaluated these images and lab results as part of my medical decision-making.   EKG Interpretation None      MDM   Final diagnoses:  Right ankle sprain, initial encounter  Left shoulder pain, rule out fracture of greater tuberosity however most likely chronic rotator cuff changes  Right knee pain  Left wrist pain  Chronic hypotension   55 year old female with a history of ESRD on dialysis, diabetes, COPD, pulmonary embolus, chronic hypertension on [place], recent admission for MSSA bacteremia presents with concern for mechanical fall with right knee, right ankle, and left shoulder pain.   Patient denies any other areas of injury. Patient did not hit her head and denies any loss consciousness and have low suspicion for intracranial bleed. X-rays were obtained and showed no acute fracture of the right knee or ankle, and showed question of abnormality to greater tuberosity of the humerus felt to be chronic rotator cuff changes. Patient with some right ankle swelling, otherwise neurovascularly intact, and tenderness over the anterior talofibular ligament and feel presentation is most likely consistent with sprain. Patient was given air splint and crutches to use as needed. Feel patient's shoulder pain is unlikely to represent fracture, however given questionable  finding on x-ray, provided patient with a shoulder sling for comfort and recommendation that the pain does not improve she should follow up closely with a PCP and consider orthopedic consult.   Patient has chronic hypotension with BP at baseline, no lightheadedness, and no other acute concerns.  Patient discharged in stable condition with understanding of reasons to return.     Gareth Morgan, MD 12/20/15 [phone]

## 2015-12-19 NOTE — ED Notes (Addendum)
Pt reports falling down approx 2-3 steps this am. No loc, denies hitting her head. "felt something pop in right knee" and now having pain. Pt is hypotensive at triage, states that's her norm, takes meds to raise her bp.

## 2015-12-19 NOTE — Progress Notes (Signed)
Orthopedic Tech Progress Note Patient Details:  [name] 12-03-1961 [id number]  Ortho Devices Type of Ortho Device: Crutches, Arm sling, Ankle Air splint Ortho Device/Splint Location: rle Ortho Device/Splint Interventions: Ordered, Application Instructed pt on how to apply sling at home as it is for comfort per drs order  [name] 12/19/2015, 9:35 PM

## 2015-12-19 NOTE — Discharge Instructions (Signed)
Ankle Sprain °An ankle sprain is an injury to the strong, fibrous tissues (ligaments) that hold your ankle bones together.  °HOME CARE  °· Put ice on your ankle for 1-2 days or as told by your doctor. °¨ Put ice in a plastic bag. °¨ Place a towel between your skin and the bag. °¨ Leave the ice on for 15-20 minutes at a time, every 2 hours while you are awake. °· Only take medicine as told by your doctor. °· Raise (elevate) your injured ankle above the level of your heart as much as possible for 2-3 days. °· Use crutches if your doctor tells you to. Slowly put your own weight on the affected ankle. Use the crutches until you can walk without pain. °· If you have a plaster splint: °¨ Do not rest it on anything harder than a pillow for 24 hours. °¨ Do not put weight on it. °¨ Do not get it wet. °¨ Take it off to shower or bathe. °· If given, use an elastic wrap or support stocking for support. Take the wrap off if your toes lose feeling (numb), tingle, or turn cold or blue. °· If you have an air splint: °¨ Add or let out air to make it comfortable. °¨ Take it off at night and to shower and bathe. °¨ Wiggle your toes and move your ankle up and down often while you are wearing it. °GET HELP IF: °· You have rapidly increasing bruising or puffiness (swelling). °· Your toes feel very cold. °· You lose feeling in your foot. °· Your medicine does not help your pain. °GET HELP RIGHT AWAY IF:  °· Your toes lose feeling (numb) or turn blue. °· You have severe pain that is increasing. °MAKE SURE YOU:  °· Understand these instructions. °· Will watch your condition. °· Will get help right away if you are not doing well or get worse. °  °This information is not intended to replace advice given to you by your health care provider. Make sure you discuss any questions you have with your health care provider. °  °Document Released: 05/14/2008 Document Revised: 12/17/2014 Document Reviewed: 06/09/2012 °Elsevier Interactive Patient  Education ©2016 [employer] ° °

## 2015-12-22 IMAGING — CR DG CHEST 1V PORT
1 series · 1 of 1 positions shown · non-contrast
Comparison: 03/25/2014

CLINICAL DATA: Shortness of breath and congestion

EXAM:
PORTABLE CHEST - 1 VIEW

[AP]
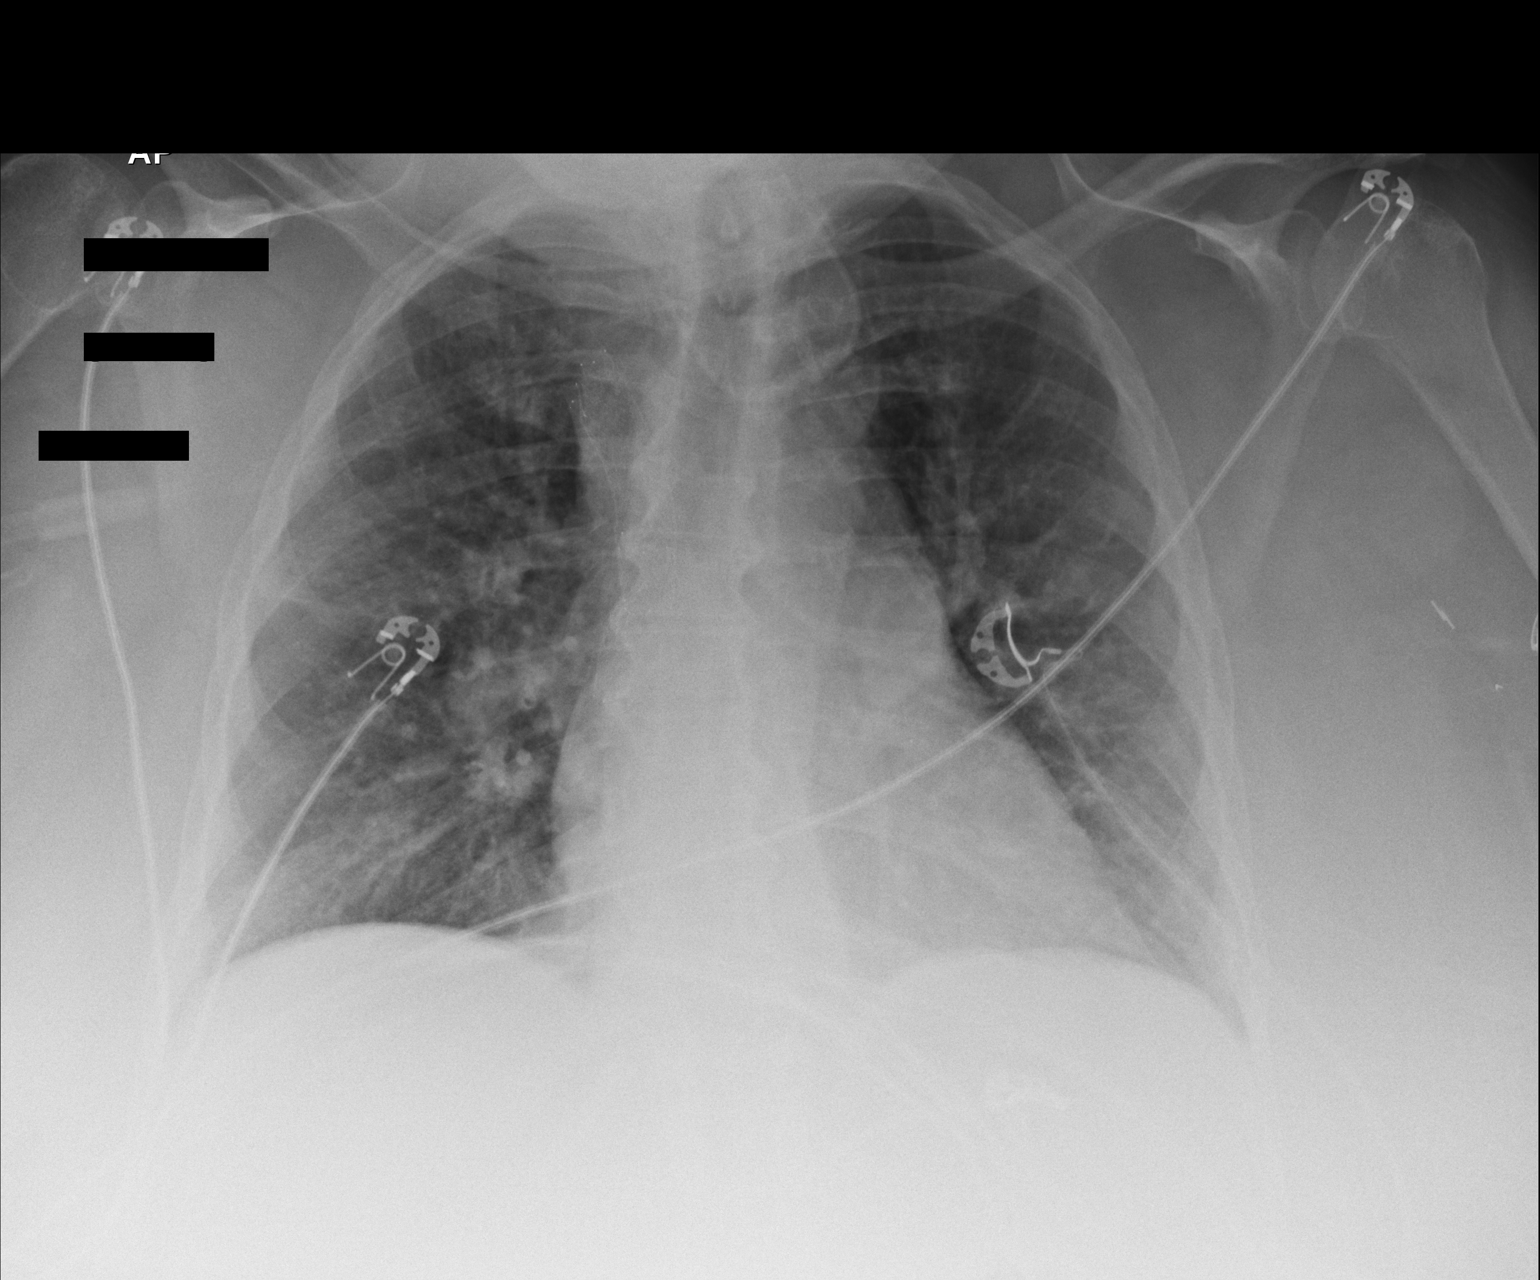

[1 of 1 positions shown; findings below may reference images not displayed]

FINDINGS: There is diffuse interstitial coarsening with bronchial cuffing. No
overt pulmonary edema. No effusion, asymmetric opacity, or
pneumothorax.

Normal heart size for technique. A SVC stent is again noted.
Unchanged appearance of the mildly widened upper mediastinum,
accentuated by portable technique.
IMPRESSION: Diffuse interstitial coarsening which could be congestive or
bronchitic.

## 2015-12-23 IMAGING — CR DG CHEST 1V PORT
1 series · 1 of 1 positions shown · non-contrast
Comparison: Single view of the chest 08/10/2014. And lateral chest
03/25/2014.

CLINICAL DATA: Shortness of breath.

EXAM:
PORTABLE CHEST - 1 VIEW

[AP]
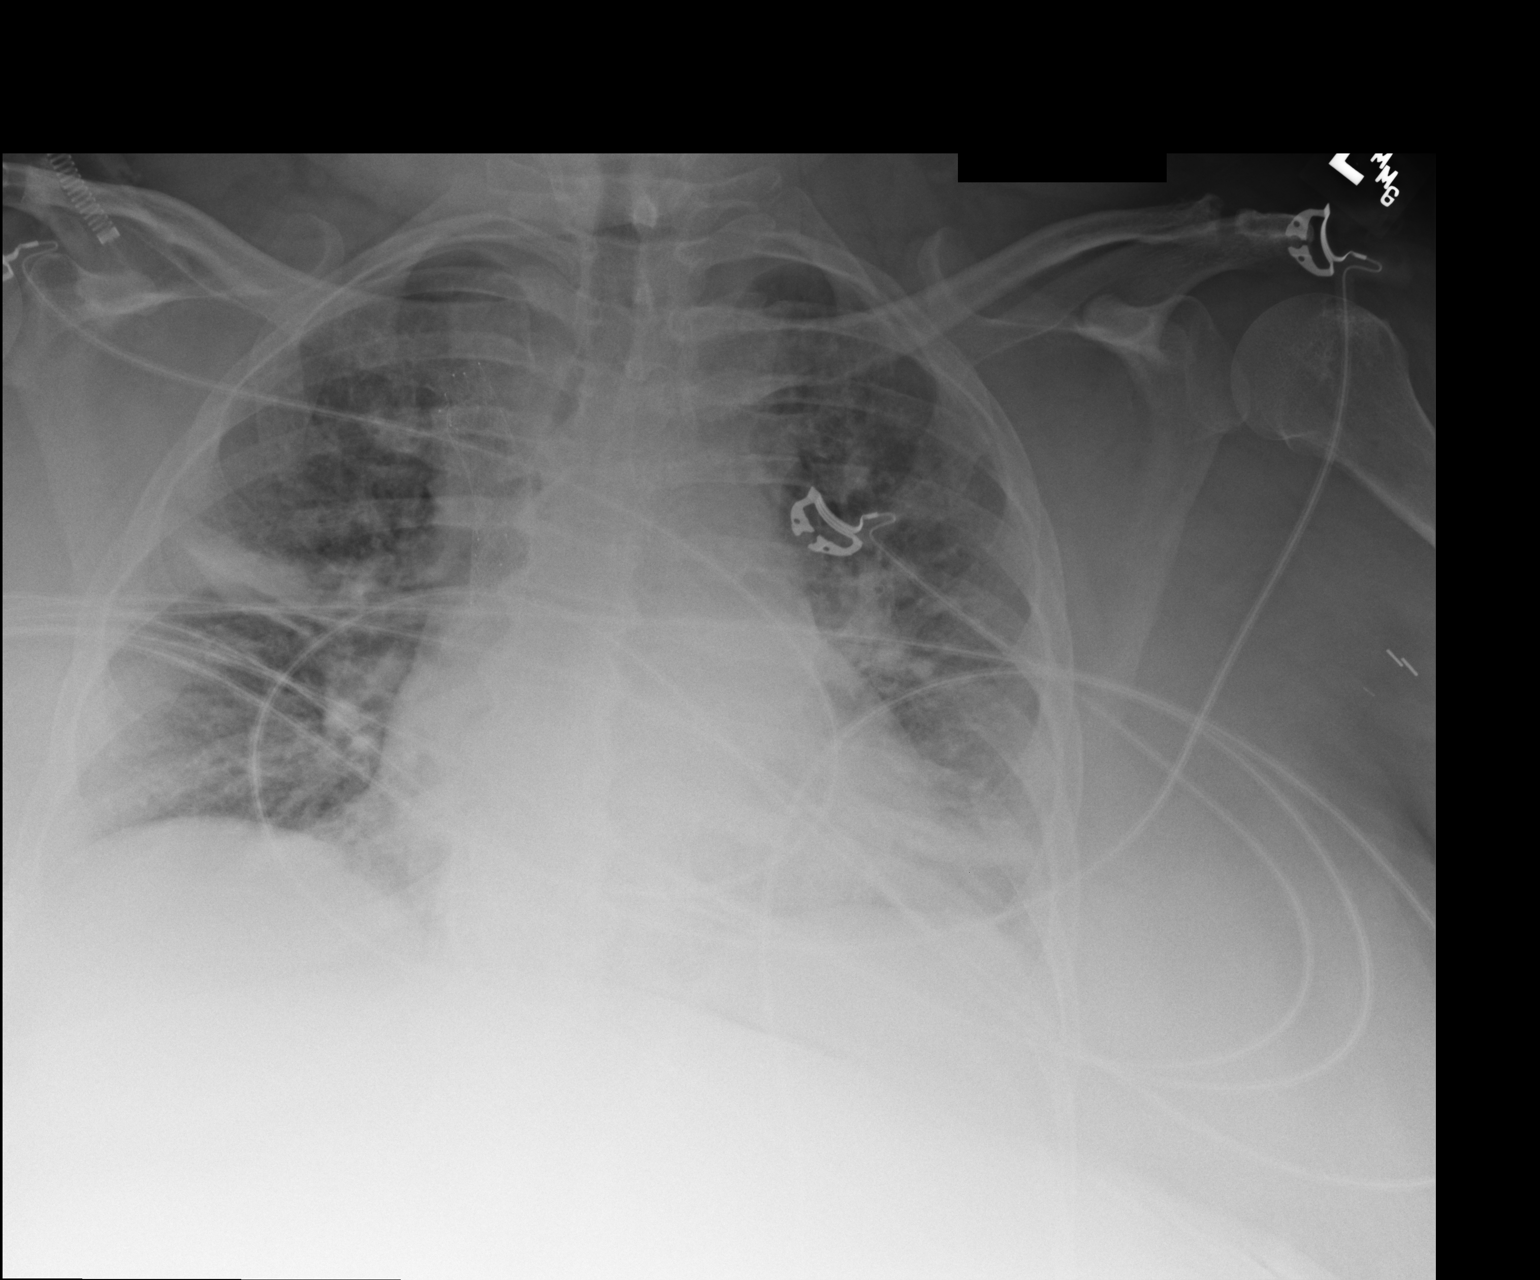

[1 of 1 positions shown; findings below may reference images not displayed]

FINDINGS: There is cardiomegaly and mild interstitial edema. Discoid
atelectasis is seen in the right mid lung zone and left lung base.
No pneumothorax or pleural effusion is identified.
IMPRESSION: Cardiomegaly with mild interstitial edema and scattered atelectasis.

## 2015-12-25 ENCOUNTER — Other Ambulatory Visit: Payer: Self-pay

## 2016-01-05 ENCOUNTER — Encounter (HOSPITAL_COMMUNITY): Payer: Self-pay | Admitting: Emergency Medicine

## 2016-01-05 ENCOUNTER — Emergency Department (HOSPITAL_COMMUNITY): Payer: Medicare Other

## 2016-01-05 ENCOUNTER — Emergency Department (HOSPITAL_COMMUNITY)
Admission: EM | Admit: 2016-01-05 | Discharge: 2016-01-05 | Disposition: A | Discharge status: Home / Self Care | Payer: Medicare Other | Attending: Emergency Medicine | Admitting: Emergency Medicine

## 2016-01-05 DIAGNOSIS — Z862 Personal history of diseases of the blood and blood-forming organs and certain disorders involving the immune mechanism: Secondary | ICD-10-CM | POA: Diagnosis not present

## 2016-01-05 DIAGNOSIS — Z8669 Personal history of other diseases of the nervous system and sense organs: Secondary | ICD-10-CM | POA: Diagnosis not present

## 2016-01-05 DIAGNOSIS — M791 Myalgia: Secondary | ICD-10-CM | POA: Insufficient documentation

## 2016-01-05 DIAGNOSIS — Z794 Long term (current) use of insulin: Secondary | ICD-10-CM | POA: Diagnosis not present

## 2016-01-05 DIAGNOSIS — R0981 Nasal congestion: Secondary | ICD-10-CM

## 2016-01-05 DIAGNOSIS — R5383 Other fatigue: Secondary | ICD-10-CM | POA: Diagnosis not present

## 2016-01-05 DIAGNOSIS — R110 Nausea: Secondary | ICD-10-CM

## 2016-01-05 DIAGNOSIS — E119 Type 2 diabetes mellitus without complications: Secondary | ICD-10-CM | POA: Insufficient documentation

## 2016-01-05 DIAGNOSIS — Z880 Allergy status to penicillin: Secondary | ICD-10-CM | POA: Insufficient documentation

## 2016-01-05 DIAGNOSIS — Z86711 Personal history of pulmonary embolism: Secondary | ICD-10-CM | POA: Diagnosis not present

## 2016-01-05 DIAGNOSIS — Z992 Dependence on renal dialysis: Secondary | ICD-10-CM | POA: Diagnosis not present

## 2016-01-05 DIAGNOSIS — J449 Chronic obstructive pulmonary disease, unspecified: Secondary | ICD-10-CM | POA: Diagnosis not present

## 2016-01-05 DIAGNOSIS — R6883 Chills (without fever): Secondary | ICD-10-CM | POA: Diagnosis not present

## 2016-01-05 DIAGNOSIS — Z8701 Personal history of pneumonia (recurrent): Secondary | ICD-10-CM | POA: Diagnosis not present

## 2016-01-05 DIAGNOSIS — Z79899 Other long term (current) drug therapy: Secondary | ICD-10-CM | POA: Insufficient documentation

## 2016-01-05 DIAGNOSIS — N186 End stage renal disease: Secondary | ICD-10-CM | POA: Insufficient documentation

## 2016-01-05 DIAGNOSIS — K219 Gastro-esophageal reflux disease without esophagitis: Secondary | ICD-10-CM | POA: Insufficient documentation

## 2016-01-05 DIAGNOSIS — I120 Hypertensive chronic kidney disease with stage 5 chronic kidney disease or end stage renal disease: Secondary | ICD-10-CM | POA: Insufficient documentation

## 2016-01-05 DIAGNOSIS — R059 Cough, unspecified: Secondary | ICD-10-CM

## 2016-01-05 DIAGNOSIS — R05 Cough: Secondary | ICD-10-CM

## 2016-01-05 LAB — BASIC METABOLIC PANEL
Anion gap: 11 (ref 5–15)
BUN: 6 mg/dL (ref 6–20)
CHLORIDE: 98 mmol/L — AB (ref 101–111)
CO2: 27 mmol/L (ref 22–32)
CREATININE: 3.78 mg/dL — AB (ref 0.44–1.00)
Calcium: 9.9 mg/dL (ref 8.9–10.3)
GFR, EST AFRICAN AMERICAN: 14 mL/min — AB (ref >60)
GFR, EST NON AFRICAN AMERICAN: 12 mL/min — AB (ref >60)
Glucose, Bld: 124 mg/dL — ABNORMAL HIGH (ref 65–99)
Potassium: 3.4 mmol/L — ABNORMAL LOW (ref 3.5–5.1)
SODIUM: 136 mmol/L (ref 135–145)

## 2016-01-05 LAB — CBC
HCT: 41.8 % (ref 36.0–46.0)
Hemoglobin: 13.4 g/dL (ref 12.0–15.0)
MCH: 29.6 pg (ref 26.0–34.0)
MCHC: 32.1 g/dL (ref 30.0–36.0)
MCV: 92.3 fL (ref 78.0–100.0)
PLATELETS: 145 10*3/uL — AB (ref 150–400)
RBC: 4.53 MIL/uL (ref 3.87–5.11)
RDW: 14.2 % (ref 11.5–15.5)
WBC: 4.8 10*3/uL (ref 4.0–10.5)

## 2016-01-05 LAB — CBG MONITORING, ED: GLUCOSE-CAPILLARY: 109 mg/dL — AB (ref 65–99)

## 2016-01-05 LAB — I-STAT CG4 LACTIC ACID, ED: LACTIC ACID, VENOUS: 1.23 mmol/L (ref 0.5–2.0)

## 2016-01-05 LAB — RAPID STREP SCREEN (MED CTR MEBANE ONLY): STREPTOCOCCUS, GROUP A SCREEN (DIRECT): NEGATIVE

## 2016-01-05 MED ORDER — OXYMETAZOLINE HCL 0.05 % NA SOLN: 1.0000 | Freq: Two times a day (BID) | NASAL | Status: DC

## 2016-01-05 MED ORDER — ONDANSETRON HCL 4 MG/2ML IJ SOLN
4.0000 mg | Freq: Once | INTRAMUSCULAR | Status: AC
  Administered 2016-01-05: 4 mg via INTRAVENOUS
  Filled 2016-01-05: qty 2

## 2016-01-05 MED ORDER — ONDANSETRON 4 MG PO TBDP: 4.0000 mg | ORAL_TABLET | Freq: Three times a day (TID) | ORAL | Status: DC | PRN

## 2016-01-05 MED ORDER — BENZONATATE 100 MG PO CAPS: 200.0000 mg | ORAL_CAPSULE | Freq: Two times a day (BID) | ORAL | Status: DC | PRN

## 2016-01-05 NOTE — ED Notes (Signed)
PA at bedside.

## 2016-01-05 NOTE — ED Notes (Signed)
Patient transported to X-ray 

## 2016-01-05 NOTE — Discharge Instructions (Signed)
Take your new medications as prescribed. Continue taking your home medications as prescribed and attending your dialysis treatments as scheduled. Follow up with your primary care provider in 2 days. Please return to the Emergency Department if symptoms worsen or new onset of fever, shortness of breath, wheezing, chest pain, vomiting, diarrhea, lightheadedness, dizziness syncope, seizure.

## 2016-01-05 NOTE — ED Notes (Signed)
PT returns from Xray at this time

## 2016-01-05 NOTE — ED Notes (Signed)
Patient is dialysis and states that she does not make urine.

## 2016-01-05 NOTE — ED Notes (Signed)
EDP aware of BP and BP trend. PT reports her BP is usually in the "80s and 90s." Family present agrees with this statement

## 2016-01-05 NOTE — ED Notes (Addendum)
Pt from dialysis center after treatment for eval of nausea and "pain all over my body." pt denies any abd pain, denies any emesis or diarrhea. Pt BP noted to be 83/48 in triage, pt alert and oriented, states before dialysis bp was [id number] systolic. nad noted.   Pt also reports after triage that she felt weak before dialysis and has had chills for x3 days.

## 2016-01-05 NOTE — ED Provider Notes (Signed)
CSN: [id number]     Arrival date & time 01/05/16  1206 History   First MD Initiated Contact with Patient 01/05/16 1225     Chief Complaint  Patient presents with  . Nausea     (Consider location/radiation/quality/duration/timing/severity/associated sxs/prior Treatment) HPI   Patient is a 55 year old female with past medical history of ESRD (on hemodialysis Tuesday Thursday Saturday), COPD, PE, SEC syndrome and chronic hypotension who presents to the ED from dialysis center status post treatment with complaint of nausea. Patient reports she has felt nauseous with associated generalized malaise, weakness and chills since yesterday. Endorses associated nasal congestion, sinus pressure and productive cough. She reports coughing up green/yellow sputum. Denies fever, headache, sore throat, shortness of breath, wheezing, chest pain, palpitations, abdominal pain, vomiting, diarrhea, blood in stool, numbness, tingling, weakness, syncope. Patient reports that she felt weak prior to dialysis today but notes she completed her dialysis treatment prior to coming to the ED. Patient endorses history of hypotension and notes she took her medications this morning prior to dialysis. Patient reports she has been around her nephew who has had a cold this week.  Past Medical History  Diagnosis Date  . HTN (hypertension)   . Obesities, morbid ([place])   . GERD (gastroesophageal reflux disease)   . PE (pulmonary embolism) ~ 2010  . Superior vena cava syndrome     Archie Endo 03/26/2013  . Pneumonia     "now & once a long time ago" (03/26/2013)  . Shortness of breath     "just a little bit; at any time" (03/26/2013)  . OSA on CPAP   . Iron deficiency anemia   . Chronic hypotension   . COPD (chronic obstructive pulmonary disease) ([place])   . ESRD (end stage renal disease) on dialysis [facility])     "TTS; [place]; (03/25/2014)  . 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 Complication of anesthesia      confusion   Past Surgical History  Procedure Laterality Date  . Vascular surgery    . Thrombectomy and revision of arterioventous (av) goretex  graft Left ~ 12/2012    "thigh"   . Arteriovenous graft placement Left ~ 2011    "thigh" (4  . Carpal tunnel release Right ~ 2011  . Carpal tunnel release Right 01/28/2015    Procedure: RIGHT CARPAL TUNNEL RELEASE;  Surgeon: Roseanne Kaufman, MD;  Location: [facility];  Service: Orthopedics;  Laterality: Right;  . Revision of arteriovenous goretex graft Left 08/14/2015    Procedure: REVISION OF ARTERIOVENOUS GORETEX GRAFT LEFT THIGH with  REMOVAL of segment of infected graft.;  Surgeon: Angelia Mould, MD;  Location: [facility] OR;  Service: Vascular;  Laterality: Left;   Family History  Problem Relation Age of Onset  . Diabetes Mellitus II Mother   . Hypertension Mother   . Hypertension Father   . Diabetes Mellitus II Father    Social History  Substance Use Topics  . Smoking status: Never Smoker   . Smokeless tobacco: Never Used  . Alcohol Use: No   OB History    No data available     Review of Systems  Constitutional: Positive for chills and fatigue.  HENT: Positive for congestion and sinus pressure.   Respiratory: Positive for cough.   Gastrointestinal: Positive for nausea.  Musculoskeletal: Positive for myalgias.  All other systems reviewed and are negative.     Allergies  Amoxicillin and Iohexol  Home Medications   Prior  to Admission medications   Medication Sig Start Date End Date Taking? Authorizing Provider  albuterol (PROVENTIL HFA;VENTOLIN HFA) 108 (90 BASE) MCG/ACT inhaler Inhale 2 puffs into the lungs every 6 (six) hours as needed. For shortness of breath. Patient taking differently: Inhale 2 puffs into the lungs every 6 (six) hours as needed for wheezing or shortness of breath.  03/27/14  Yes Adeline Saralyn Pilar, MD  ATROVENT HFA 17 MCG/ACT inhaler Inhale 1 puff into the lungs every 6 (six) hours as needed. Wheezing 01/03/16   Yes Historical Provider, MD  B Complex-C (B-COMPLEX WITH VITAMIN C) tablet Take 1 tablet by mouth daily.   Yes Historical Provider, MD  calcium acetate (PHOSLO) 667 MG capsule Take 1,334 mg by mouth 3 (three) times daily with meals.    Yes Historical Provider, MD  calcium elemental as carbonate (TUMS ULTRA 1000) 400 MG tablet Chew 1,000 mg by mouth daily.   Yes Historical Provider, MD  insulin aspart (NOVOLOG FLEXPEN) 100 UNIT/ML injection Inject 12-15 Units into the skin 3 (three) times daily before meals. Per sliding scale   Yes Historical Provider, MD  insulin glargine (LANTUS) 100 UNIT/ML injection Inject 0.08 mLs (8 Units total) into the skin at bedtime. 08/18/15  Yes Maryann Mikhail, DO  metoCLOPramide (REGLAN) 5 MG tablet Take 5 mg by mouth at bedtime.     Yes Historical Provider, MD  midodrine (PROAMATINE) 10 MG tablet Take 1 tablet (10 mg total) by mouth 3 (three) times daily with meals. 08/18/15  Yes Maryann Mikhail, DO  oxyCODONE (OXY IR/ROXICODONE) 5 MG immediate release tablet Take 1 tablet (5 mg total) by mouth every 4 (four) hours as needed for severe pain. 01/28/15  Yes Roseanne Kaufman, MD  tiotropium (SPIRIVA) 18 MCG inhalation capsule Place 18 mcg into inhaler and inhale daily.   Yes Historical Provider, MD  vancomycin (VANCOCIN) 1 GM/200ML SOLN Inject 200 mLs (1,000 mg total) into the vein Every Tuesday,Thursday,and Saturday with dialysis. 08/18/15  Yes Maryann Mikhail, DO  benzonatate (TESSALON) 100 MG capsule Take 2 capsules (200 mg total) by mouth 2 (two) times daily as needed for cough. 01/05/16   Nona Dell, PA-C  ondansetron (ZOFRAN ODT) 4 MG disintegrating tablet Take 1 tablet (4 mg total) by mouth every 8 (eight) hours as needed for nausea or vomiting. 01/05/16   Nona Dell, PA-C  oxymetazoline (AFRIN NASAL SPRAY) 0.05 % nasal spray Place 1 spray into both nostrils 2 (two) times daily. 01/05/16   Chesley Noon Nadeau, PA-C   BP 90/54 mmHg  Pulse 88   Temp(Src) 98 F (36.7 C) (Oral)  Resp 20  Ht 5\' 1"  (1.549 m)  SpO2 98% Physical Exam  Constitutional: She is oriented to person, place, and time. She appears well-developed and well-nourished.  Morbidly obese female  HENT:  Head: Normocephalic and atraumatic.  Right Ear: Tympanic membrane normal.  Left Ear: Tympanic membrane normal.  Nose: Rhinorrhea present. Right sinus exhibits maxillary sinus tenderness and frontal sinus tenderness. Left sinus exhibits maxillary sinus tenderness and frontal sinus tenderness.  Mouth/Throat: Uvula is midline, oropharynx is clear and moist and mucous membranes are normal. No oropharyngeal exudate.  Eyes: Conjunctivae and EOM are normal. Pupils are equal, round, and reactive to light. Right eye exhibits no discharge. Left eye exhibits no discharge. No scleral icterus.  Neck: Normal range of motion. Neck supple.  Cardiovascular: Normal rate, regular rhythm, normal heart sounds and intact distal pulses.   Pulmonary/Chest: Effort normal and breath sounds normal. No respiratory  distress. She has no wheezes. She has no rales. She exhibits no tenderness.  Abdominal: Soft. Bowel sounds are normal. She exhibits no distension and no mass. There is no tenderness. There is no rebound and no guarding.  Musculoskeletal: Normal range of motion. She exhibits no edema.  Lymphadenopathy:    She has no cervical adenopathy.  Neurological: She is alert and oriented to person, place, and time.  Skin: Skin is warm and dry.  Fistula present at left upper anterior thigh with palpable thrill. No surrounding erythema, swelling, redness or drainage.  Nursing note and vitals reviewed.   ED Course  Procedures (including critical care time) Labs Review Labs Reviewed  BASIC METABOLIC PANEL - Abnormal; Notable for the following:    Potassium 3.4 (*)    Chloride 98 (*)    Glucose, Bld 124 (*)    Creatinine, Ser 3.78 (*)    GFR calc non Af Amer 12 (*)    GFR calc Af Amer 14 (*)     All other components within normal limits  CBC - Abnormal; Notable for the following:    Platelets 145 (*)    All other components within normal limits  CBG MONITORING, ED - Abnormal; Notable for the following:    Glucose-Capillary 109 (*)    All other components within normal limits  RAPID STREP SCREEN (NOT AT [facility])  CULTURE, GROUP A STREP ([place])  CULTURE, GROUP A STREP ([place])  URINALYSIS, ROUTINE W REFLEX MICROSCOPIC (NOT AT [facility])  I-STAT CG4 LACTIC ACID, ED    Imaging Review Dg Chest 2 View  01/05/2016  CLINICAL DATA:  Productive cough, shortness of breath, body aches EXAM: CHEST  2 VIEW COMPARISON:  08/15/2015 FINDINGS: Cardiomediastinal silhouette is stable. No acute infiltrate or pulmonary edema. Vascular stent in the region of SVC again noted. Mild degenerative changes thoracic spine. IMPRESSION: No active disease.  Degenerative changes thoracic spine. Electronically Signed   By: Lahoma Crocker M.D.   On: 01/05/2016 13:11   I have personally reviewed and evaluated these images and lab results as part of my medical decision-making.   EKG Interpretation None      MDM   Final diagnoses:  Nausea  Nasal congestion  Cough    Pt presents with nausea, generalized malaise, chills, weakness, congestion, cough. Denies fever. PMH of ESRD (on hemodialysis Tuesday Thursday Saturday), COPD, PE, SEC syndrome and chronic hypotension. VSS, BP stable with systolic in the 0000000 at pt's baseline BP. Exam revealed rhinorrhea, bilateral maxillary and frontal sinus tenderness, lungs CTAB. Fistula present at left upper anterior thigh with palpable thrill, no surrounding erythema, swelling, redness or drainage. EKG showed sinus rhythm with 1st degree AV block. CBC and BMP at pt's baseline values. Rapid strep negative. Lactic 1.23. CXR showed no active disease. Pt's vitals have been stable while in the ED. On reevaluation, pt reports she is feeling better. I suspect pt's sxs are likely due to viral URI and  do not feel that any further workup is warranted at this time. Plan to d/c pt home with symptomatic tx. Advised pt to follow up with her PCP closely.  Evaluation does not show pathology requring ongoing emergent intervention or admission. Pt is hemodynamically stable and mentating appropriately. Discussed findings/results and plan with patient/guardian, who agrees with plan. All questions answered. Return precautions discussed and outpatient follow up given.      Chesley Noon [place], [place] 01/05/16 1512  Julianne Rice, MD 01/06/16 [phone]

## 2016-01-08 LAB — CULTURE, GROUP A STREP (THRC)

## 2016-04-10 ENCOUNTER — Emergency Department (HOSPITAL_COMMUNITY)
Admission: EM | Admit: 2016-04-10 | Discharge: 2016-04-11 | Disposition: A | Discharge status: Home / Self Care | Payer: Medicare Other | Attending: Emergency Medicine | Admitting: Emergency Medicine

## 2016-04-10 ENCOUNTER — Encounter (HOSPITAL_COMMUNITY): Payer: Self-pay

## 2016-04-10 ENCOUNTER — Emergency Department (HOSPITAL_COMMUNITY): Payer: Medicare Other

## 2016-04-10 DIAGNOSIS — J4541 Moderate persistent asthma with (acute) exacerbation: Secondary | ICD-10-CM | POA: Diagnosis not present

## 2016-04-10 DIAGNOSIS — Z8719 Personal history of other diseases of the digestive system: Secondary | ICD-10-CM | POA: Diagnosis not present

## 2016-04-10 DIAGNOSIS — J449 Chronic obstructive pulmonary disease, unspecified: Secondary | ICD-10-CM | POA: Insufficient documentation

## 2016-04-10 DIAGNOSIS — G4733 Obstructive sleep apnea (adult) (pediatric): Secondary | ICD-10-CM | POA: Diagnosis not present

## 2016-04-10 DIAGNOSIS — Z794 Long term (current) use of insulin: Secondary | ICD-10-CM | POA: Diagnosis not present

## 2016-04-10 DIAGNOSIS — Z8701 Personal history of pneumonia (recurrent): Secondary | ICD-10-CM | POA: Diagnosis not present

## 2016-04-10 DIAGNOSIS — N186 End stage renal disease: Secondary | ICD-10-CM | POA: Insufficient documentation

## 2016-04-10 DIAGNOSIS — Z862 Personal history of diseases of the blood and blood-forming organs and certain disorders involving the immune mechanism: Secondary | ICD-10-CM | POA: Diagnosis not present

## 2016-04-10 DIAGNOSIS — Z79899 Other long term (current) drug therapy: Secondary | ICD-10-CM | POA: Diagnosis not present

## 2016-04-10 DIAGNOSIS — Z992 Dependence on renal dialysis: Secondary | ICD-10-CM | POA: Diagnosis not present

## 2016-04-10 DIAGNOSIS — Z880 Allergy status to penicillin: Secondary | ICD-10-CM | POA: Diagnosis not present

## 2016-04-10 DIAGNOSIS — E119 Type 2 diabetes mellitus without complications: Secondary | ICD-10-CM | POA: Insufficient documentation

## 2016-04-10 DIAGNOSIS — I120 Hypertensive chronic kidney disease with stage 5 chronic kidney disease or end stage renal disease: Secondary | ICD-10-CM | POA: Insufficient documentation

## 2016-04-10 DIAGNOSIS — Z9981 Dependence on supplemental oxygen: Secondary | ICD-10-CM | POA: Diagnosis not present

## 2016-04-10 DIAGNOSIS — Z86711 Personal history of pulmonary embolism: Secondary | ICD-10-CM | POA: Insufficient documentation

## 2016-04-10 DIAGNOSIS — R079 Chest pain, unspecified: Secondary | ICD-10-CM | POA: Diagnosis present

## 2016-04-10 LAB — CBC
HEMATOCRIT: 41.5 % (ref 36.0–46.0)
Hemoglobin: 13 g/dL (ref 12.0–15.0)
MCH: 28.4 pg (ref 26.0–34.0)
MCHC: 31.3 g/dL (ref 30.0–36.0)
MCV: 90.8 fL (ref 78.0–100.0)
Platelets: 157 10*3/uL (ref 150–400)
RBC: 4.57 MIL/uL (ref 3.87–5.11)
RDW: 14.5 % (ref 11.5–15.5)
WBC: 5.2 10*3/uL (ref 4.0–10.5)

## 2016-04-10 LAB — BASIC METABOLIC PANEL
ANION GAP: 12 (ref 5–15)
BUN: 16 mg/dL (ref 6–20)
CHLORIDE: 99 mmol/L — AB (ref 101–111)
CO2: 24 mmol/L (ref 22–32)
Calcium: 8.9 mg/dL (ref 8.9–10.3)
Creatinine, Ser: 4.84 mg/dL — ABNORMAL HIGH (ref 0.44–1.00)
GFR calc Af Amer: 11 mL/min — ABNORMAL LOW (ref >60)
GFR, EST NON AFRICAN AMERICAN: 9 mL/min — AB (ref >60)
GLUCOSE: 151 mg/dL — AB (ref 65–99)
POTASSIUM: 3.8 mmol/L (ref 3.5–5.1)
Sodium: 135 mmol/L (ref 135–145)

## 2016-04-10 LAB — I-STAT TROPONIN, ED: Troponin i, poc: 0 ng/mL (ref 0.00–0.08)

## 2016-04-10 LAB — TROPONIN I

## 2016-04-10 MED ORDER — PREDNISONE 10 MG PO TABS: ORAL_TABLET | ORAL | Status: DC

## 2016-04-10 MED ORDER — ALBUTEROL SULFATE (2.5 MG/3ML) 0.083% IN NEBU
5.0000 mg | INHALATION_SOLUTION | Freq: Once | RESPIRATORY_TRACT | Status: AC
  Administered 2016-04-10: 5 mg via RESPIRATORY_TRACT

## 2016-04-10 MED ORDER — ALBUTEROL SULFATE (2.5 MG/3ML) 0.083% IN NEBU
INHALATION_SOLUTION | RESPIRATORY_TRACT | Status: AC
  Administered 2016-04-10: 5 mg via RESPIRATORY_TRACT
  Filled 2016-04-10: qty 6

## 2016-04-10 MED ORDER — MIDODRINE HCL 5 MG PO TABS
10.0000 mg | ORAL_TABLET | Freq: Once | ORAL | Status: AC
  Administered 2016-04-10: 10 mg via ORAL
  Filled 2016-04-10: qty 2

## 2016-04-10 MED ORDER — PREDNISONE 20 MG PO TABS
60.0000 mg | ORAL_TABLET | Freq: Once | ORAL | Status: AC
  Administered 2016-04-10: 60 mg via ORAL
  Filled 2016-04-10: qty 3

## 2016-04-10 NOTE — Discharge Instructions (Signed)
Asthma Attack Prevention While you may not be able to control the fact that you have asthma, you can take actions to prevent asthma attacks. The best way to prevent asthma attacks is to maintain good control of your asthma. You can achieve this by:  Taking your medicines as directed.  Avoiding things that can irritate your airways or make your asthma symptoms worse (asthma triggers).  Keeping track of how well your asthma is controlled and of any changes in your symptoms.  Responding quickly to worsening asthma symptoms (asthma attack).  Seeking emergency care when it is needed. WHAT ARE SOME WAYS TO PREVENT AN ASTHMA ATTACK? Have a Plan Work with your health care provider to create a written plan for managing and treating your asthma attacks (asthma action plan). This plan includes:  A list of your asthma triggers and how you can avoid them.  Information on when medicines should be taken and when their dosages should be changed.  The use of a device that measures how well your lungs are working (peak flow meter). Monitor Your Asthma Use your peak flow meter and record your results in a journal every day. A drop in your peak flow numbers on one or more days may indicate the start of an asthma attack. This can happen even before you start to feel symptoms. You can prevent an asthma attack from getting worse by following the steps in your asthma action plan. Avoid Asthma Triggers Work with your asthma health care provider to find out what your asthma triggers are. This can be done by:  Allergy testing.  Keeping a journal that notes when asthma attacks occur and the factors that may have contributed to them.  Determining if there are other medical conditions that are making your asthma worse. Once you have determined your asthma triggers, take steps to avoid them. This may include avoiding excessive or prolonged exposure to:  Dust. Have someone dust and vacuum your home for you once or  twice a week. Using a high-efficiency particulate arrestance (HEPA) vacuum is best.  Smoke. This includes campfire smoke, forest fire smoke, and secondhand smoke from tobacco products.  Pet dander. Avoid contact with animals that you know you are allergic to.  Allergens from trees, grasses or pollens. Avoid spending a lot of time outdoors when pollen counts are high, and on very windy days.  Very cold, dry, or humid air.  Mold.  Foods that contain high amounts of sulfites.  Strong odors.  Outdoor air pollutants, such as Lexicographer.  Indoor air pollutants, such as aerosol sprays and fumes from household cleaners.  Household pests, including dust mites and cockroaches, and pest droppings.  Certain medicines, including NSAIDs. Always talk to your health care provider before stopping or starting any new medicines. Medicines Take over-the-counter and prescription medicines only as told by your health care provider. Many asthma attacks can be prevented by carefully following your medicine schedule. Taking your medicines correctly is especially important when you cannot avoid certain asthma triggers. Act Quickly If an asthma attack does happen, acting quickly can decrease how severe it is and how long it lasts. Take these steps:   Pay attention to your symptoms. If you are coughing, wheezing, or having difficulty breathing, do not wait to see if your symptoms go away on their own. Follow your asthma action plan.  If you have followed your asthma action plan and your symptoms are not improving, call your health care provider or seek immediate medical care  at the nearest hospital. It is important to note how often you need to use your fast-acting rescue inhaler. If you are using your rescue inhaler more often, it may mean that your asthma is not under control. Adjusting your asthma treatment plan may help you to prevent future asthma attacks and help you to gain better control of your  condition. HOW CAN I PREVENT AN ASTHMA ATTACK WHEN I EXERCISE? Follow advice from your health care provider about whether you should use your fast-acting inhaler before exercising. Many people with asthma experience exercise-induced bronchoconstriction (EIB). This condition often worsens during vigorous exercise in cold, humid, or dry environments. Usually, people with EIB can stay very active by pre-treating with a fast-acting inhaler before exercising.   This information is not intended to replace advice given to you by your health care provider. Make sure you discuss any questions you have with your health care provider.   Document Released: 11/14/2009 Document Revised: 08/17/2015 Document Reviewed: 04/28/2015 Elsevier Interactive Patient Education 2016 [place].  Asthma, Adult Asthma is a recurring condition in which the airways tighten and narrow. Asthma can make it difficult to breathe. It can cause coughing, wheezing, and shortness of breath. Asthma episodes, also called asthma attacks, range from minor to life-threatening. Asthma cannot be cured, but medicines and lifestyle changes can help control it. CAUSES Asthma is believed to be caused by inherited (genetic) and environmental factors, but its exact cause is unknown. Asthma may be triggered by allergens, lung infections, or irritants in the air. Asthma triggers are different for each person. Common triggers include:   Animal dander.  Dust mites.  Cockroaches.  Pollen from trees or grass.  Mold.  Smoke.  Air pollutants such as dust, household cleaners, hair sprays, aerosol sprays, paint fumes, strong chemicals, or strong odors.  Cold air, weather changes, and winds (which increase molds and pollens in the air).  Strong emotional expressions such as crying or laughing hard.  Stress.  Certain medicines (such as aspirin) or types of drugs (such as beta-blockers).  Sulfites in foods and drinks. Foods and drinks that  may contain sulfites include dried fruit, potato chips, and sparkling grape juice.  Infections or inflammatory conditions such as the flu, a cold, or an inflammation of the nasal membranes (rhinitis).  Gastroesophageal reflux disease (GERD).  Exercise or strenuous activity. SYMPTOMS Symptoms may occur immediately after asthma is triggered or many hours later. Symptoms include:  Wheezing.  Excessive nighttime or early morning coughing.  Frequent or severe coughing with a common cold.  Chest tightness.  Shortness of breath. DIAGNOSIS  The diagnosis of asthma is made by a review of your medical history and a physical exam. Tests may also be performed. These may include:  Lung function studies. These tests show how much air you breathe in and out.  Allergy tests.  Imaging tests such as X-rays. TREATMENT  Asthma cannot be cured, but it can usually be controlled. Treatment involves identifying and avoiding your asthma triggers. It also involves medicines. There are 2 classes of medicine used for asthma treatment:   Controller medicines. These prevent asthma symptoms from occurring. They are usually taken every day.  Reliever or rescue medicines. These quickly relieve asthma symptoms. They are used as needed and provide short-term relief. Your health care provider will help you create an asthma action plan. An asthma action plan is a written plan for managing and treating your asthma attacks. It includes a list of your asthma triggers and how they  may be avoided. It also includes information on when medicines should be taken and when their dosage should be changed. An action plan may also involve the use of a device called a peak flow meter. A peak flow meter measures how well the lungs are working. It helps you monitor your condition. HOME CARE INSTRUCTIONS   Take medicines only as directed by your health care provider. Speak with your health care provider if you have questions about  how or when to take the medicines.  Use a peak flow meter as directed by your health care provider. Record and keep track of readings.  Understand and use the action plan to help minimize or stop an asthma attack without needing to seek medical care.  Control your home environment in the following ways to help prevent asthma attacks:  Do not smoke. Avoid being exposed to secondhand smoke.  Change your heating and air conditioning filter regularly.  Limit your use of fireplaces and wood stoves.  Get rid of pests (such as roaches and mice) and their droppings.  Throw away plants if you see mold on them.  Clean your floors and dust regularly. Use unscented cleaning products.  Try to have someone else vacuum for you regularly. Stay out of rooms while they are being vacuumed and for a short while afterward. If you vacuum, use a dust mask from a hardware store, a double-layered or microfilter vacuum cleaner bag, or a vacuum cleaner with a HEPA filter.  Replace carpet with wood, tile, or vinyl flooring. Carpet can trap dander and dust.  Use allergy-proof pillows, mattress covers, and box spring covers.  Wash bed sheets and blankets every week in hot water and dry them in a dryer.  Use blankets that are made of polyester or cotton.  Clean bathrooms and kitchens with bleach. If possible, have someone repaint the walls in these rooms with mold-resistant paint. Keep out of the rooms that are being cleaned and painted.  Wash hands frequently. SEEK MEDICAL CARE IF:   You have wheezing, shortness of breath, or a cough even if taking medicine to prevent attacks.  The colored mucus you cough up (sputum) is thicker than usual.  Your sputum changes from clear or white to yellow, green, gray, or bloody.  You have any problems that may be related to the medicines you are taking (such as a rash, itching, swelling, or trouble breathing).  You are using a reliever medicine more than 2-3 times per  week.  Your peak flow is still at 50-79% of your personal best after following your action plan for 1 hour.  You have a fever. SEEK IMMEDIATE MEDICAL CARE IF:   You seem to be getting worse and are unresponsive to treatment during an asthma attack.  You are short of breath even at rest.  You get short of breath when doing very little physical activity.  You have difficulty eating, drinking, or talking due to asthma symptoms.  You develop chest pain.  You develop a fast heartbeat.  You have a bluish color to your lips or fingernails.  You are light-headed, dizzy, or faint.  Your peak flow is less than 50% of your personal best.   This information is not intended to replace advice given to you by your health care provider. Make sure you discuss any questions you have with your health care provider.   Document Released: 11/26/2005 Document Revised: 08/17/2015 Document Reviewed: 06/25/2013 Elsevier Interactive Patient Education [employer].

## 2016-04-10 NOTE — ED Provider Notes (Signed)
CSN: [id number]     Arrival date & time 04/10/16  1606 History   First MD Initiated Contact with Patient 04/10/16 2238     Chief Complaint  Patient presents with  . Chest Pain     (Consider location/radiation/quality/duration/timing/severity/associated sxs/prior Treatment) Patient is a 55 y.o. female presenting with chest pain. The history is provided by the patient. No language interpreter was used.  Chest Pain Pain location:  L chest Pain quality: aching   Pain radiates to:  Does not radiate Pain radiates to the back: no   Pain severity:  Mild Onset quality:  Gradual Timing:  Constant Progression:  Resolved Chronicity:  New Context: breathing   Relieved by:  Nothing Worsened by:  Nothing tried Ineffective treatments:  None tried Associated symptoms: shortness of breath   Risk factors: diabetes mellitus     Past Medical History  Diagnosis Date  . HTN (hypertension)   . Obesities, morbid ([place])   . GERD (gastroesophageal reflux disease)   . PE (pulmonary embolism) ~ 2010  . Superior vena cava syndrome     Archie Endo 03/26/2013  . Pneumonia     "now & once a long time ago" (03/26/2013)  . Shortness of breath     "just a little bit; at any time" (03/26/2013)  . OSA on CPAP   . Iron deficiency anemia   . Chronic hypotension   . COPD (chronic obstructive pulmonary disease) ([place])   . ESRD (end stage renal disease) on dialysis [facility])     "TTS; [place]; (03/25/2014)  . 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 Complication of anesthesia     confusion   Past Surgical History  Procedure Laterality Date  . Vascular surgery    . Thrombectomy and revision of arterioventous (av) goretex  graft Left ~ 12/2012    "thigh"   . Arteriovenous graft placement Left ~ 2011    "thigh" (4  . Carpal tunnel release Right ~ 2011  . Carpal tunnel release Right 01/28/2015    Procedure: RIGHT CARPAL TUNNEL RELEASE;  Surgeon: Roseanne Kaufman, MD;  Location: [place] [facility];  Service:  Orthopedics;  Laterality: Right;  . Revision of arteriovenous goretex graft Left 08/14/2015    Procedure: REVISION OF ARTERIOVENOUS GORETEX GRAFT LEFT THIGH with  REMOVAL of segment of infected graft.;  Surgeon: Angelia Mould, MD;  Location[facility];  Service: Vascular;  Laterality: Left;   Family History  Problem Relation Age of Onset  . Diabetes Mellitus II Mother   . Hypertension Mother   . Hypertension Father   . Diabetes Mellitus II Father    Social History  Substance Use Topics  . Smoking status: Never Smoker   . Smokeless tobacco: Never Used  . Alcohol Use: No   OB History    No data available     Review of Systems  Respiratory: Positive for shortness of breath.   Cardiovascular: Positive for chest pain.  All other systems reviewed and are negative.     Allergies  Amoxicillin and Iohexol  Home Medications   Prior to Admission medications   Medication Sig Start Date End Date Taking? Authorizing Provider  albuterol (PROVENTIL HFA;VENTOLIN HFA) 108 (90 BASE) MCG/ACT inhaler Inhale 2 puffs into the lungs every 6 (six) hours as needed. For shortness of breath. Patient taking differently: Inhale 2 puffs into the lungs every 6 (six) hours as needed for wheezing or shortness of breath.  03/27/14   Adeline  Saralyn Pilar, MD  ATROVENT HFA 17 MCG/ACT inhaler Inhale 1 puff into the lungs every 6 (six) hours as needed. Wheezing 01/03/16   Historical Provider, MD  B Complex-C (B-COMPLEX WITH VITAMIN C) tablet Take 1 tablet by mouth daily.    Historical Provider, MD  benzonatate (TESSALON) 100 MG capsule Take 2 capsules (200 mg total) by mouth 2 (two) times daily as needed for cough. 01/05/16   Nona Dell, PA-C  calcium acetate (PHOSLO) 667 MG capsule Take 1,334 mg by mouth 3 (three) times daily with meals.     Historical Provider, MD  calcium elemental as carbonate (TUMS ULTRA 1000) 400 MG tablet Chew 1,000 mg by mouth daily.    Historical Provider, MD  insulin aspart  (NOVOLOG FLEXPEN) 100 UNIT/ML injection Inject 12-15 Units into the skin 3 (three) times daily before meals. Per sliding scale    Historical Provider, MD  insulin glargine (LANTUS) 100 UNIT/ML injection Inject 0.08 mLs (8 Units total) into the skin at bedtime. 08/18/15   Maryann Mikhail, DO  metoCLOPramide (REGLAN) 5 MG tablet Take 5 mg by mouth at bedtime.      Historical Provider, MD  midodrine (PROAMATINE) 10 MG tablet Take 1 tablet (10 mg total) by mouth 3 (three) times daily with meals. 08/18/15   Maryann Mikhail, DO  ondansetron (ZOFRAN ODT) 4 MG disintegrating tablet Take 1 tablet (4 mg total) by mouth every 8 (eight) hours as needed for nausea or vomiting. 01/05/16   Nona Dell, PA-C  oxyCODONE (OXY IR/ROXICODONE) 5 MG immediate release tablet Take 1 tablet (5 mg total) by mouth every 4 (four) hours as needed for severe pain. 01/28/15   Roseanne Kaufman, MD  oxymetazoline (AFRIN NASAL SPRAY) 0.05 % nasal spray Place 1 spray into both nostrils 2 (two) times daily. 01/05/16   Nona Dell, PA-C  tiotropium (SPIRIVA) 18 MCG inhalation capsule Place 18 mcg into inhaler and inhale daily.    Historical Provider, MD  vancomycin (VANCOCIN) 1 GM/200ML SOLN Inject 200 mLs (1,000 mg total) into the vein Every Tuesday,Thursday,and Saturday with dialysis. 08/18/15   Maryann Mikhail, DO   BP 94/56 mmHg  Pulse 71  Temp(Src) 97.9 F (36.6 C) (Oral)  Resp 15  SpO2 94% Physical Exam  Constitutional: She is oriented to person, place, and time. She appears well-developed and well-nourished.  HENT:  Head: Normocephalic and atraumatic.  Mouth/Throat: Oropharynx is clear and moist.  Eyes: EOM are normal. Pupils are equal, round, and reactive to light.  Neck: Normal range of motion.  Cardiovascular: Normal rate.   Pulmonary/Chest: Effort normal.  Abdominal: Soft. She exhibits no distension.  Musculoskeletal: Normal range of motion.  Neurological: She is alert and oriented to person, place,  and time.  Psychiatric: She has a normal mood and affect.  Nursing note and vitals reviewed.   ED Course  Procedures (including critical care time) Labs Review Labs Reviewed  BASIC METABOLIC PANEL - Abnormal; Notable for the following:    Chloride 99 (*)    Glucose, Bld 151 (*)    Creatinine, Ser 4.84 (*)    GFR calc non Af Amer 9 (*)    GFR calc Af Amer 11 (*)    All other components within normal limits  CBC  TROPONIN I  I-STAT TROPOININ, ED    Imaging Review Dg Chest 2 View  04/10/2016  CLINICAL DATA:  Chest pain EXAM: CHEST  2 VIEW COMPARISON:  01/05/2016 chest radiograph. FINDINGS: Vascular stent is in stable position  in the right anterior mediastinum. Stable cardiomediastinal silhouette with normal heart size. No pneumothorax. No pleural effusion. Lungs appear clear, with no acute consolidative airspace disease and no pulmonary edema. Partially visualized vascular stent in the medial proximal right upper extremity. Partially visualized surgical clips in the medial proximal left upper extremity. IMPRESSION: No active cardiopulmonary disease. Electronically Signed   By: Ilona Sorrel M.D.   On: 04/10/2016 16:58   I have personally reviewed and evaluated these images and lab results as part of my medical decision-making.   EKG Interpretation   Date/Time:  Tuesday Apr 10 2016 16:11:59 EDT Ventricular Rate:  80 PR Interval:  216 QRS Duration: 78 QT Interval:  392 QTC Calculation: 452 R Axis:   47 Text Interpretation:  Sinus rhythm with 1st degree A-V block Low voltage  QRS Borderline ECG Sinus rhythm with 1st degree A-V block Low voltage QRS  Abnormal ekg Confirmed by Carmin Muskrat  MD ([id number]) on 04/10/2016 10:39:00  PM      MDM Pt reports all symptoms resolved with breathing treatment.  Pt reports her chest gets tight like tonight with asthma attacks.  Pt has had 2 negative troponins.  Pt's blood pressure is low.  She reports this is her normal range.  Pt takes midodrine.  (Pt given dosage here)  (Pt refuses to wait until blood pressure improves)  Pt given rx for prednisone taper,  60 mg here.   She agrees to return if further chest pain or shortness of breath.  Pt has an inhaler with her.   Final diagnoses:  Asthma, moderate persistent, with acute exacerbation    An After Visit Summary was printed and given to the patient. Meds ordered this encounter  Medications  . albuterol (PROVENTIL) (2.5 MG/3ML) 0.083% nebulizer solution 5 mg    Sig:   . albuterol (PROVENTIL) (2.5 MG/3ML) 0.083% nebulizer solution    Sig:     Blue-Matthews, Jamie: cabinet override  . predniSONE (DELTASONE) tablet 60 mg    Sig:   . midodrine (PROAMATINE) tablet 10 mg    Sig:   . predniSONE (DELTASONE) 10 MG tablet    Sig: 5,4,3,2,1 taper    Dispense:  15 tablet    Refill:  0    Order Specific Question:  Supervising Provider    Answer:  Noemi Chapel [id number]    Fransico Meadow, PA-C 04/11/16 0025  Carmin Muskrat, MD 04/11/16 [phone]

## 2016-04-10 NOTE — ED Notes (Signed)
Patient here with SSCP that started 15 minutes prior to arrival. Reports that her chest feels tight, non-radiating. Had dialysis earlier today

## 2016-04-11 NOTE — ED Notes (Signed)
Discharge instructions reviewed - voiced understanding 

## 2016-09-10 ENCOUNTER — Emergency Department (HOSPITAL_COMMUNITY)
Admission: EM | Admit: 2016-09-10 | Discharge: 2016-09-10 | Disposition: A | Discharge status: Home / Self Care | Payer: Medicare Other | Attending: Emergency Medicine | Admitting: Emergency Medicine

## 2016-09-10 ENCOUNTER — Encounter (HOSPITAL_COMMUNITY): Payer: Self-pay | Admitting: Nurse Practitioner

## 2016-09-10 DIAGNOSIS — I120 Hypertensive chronic kidney disease with stage 5 chronic kidney disease or end stage renal disease: Secondary | ICD-10-CM | POA: Insufficient documentation

## 2016-09-10 DIAGNOSIS — M546 Pain in thoracic spine: Secondary | ICD-10-CM | POA: Insufficient documentation

## 2016-09-10 DIAGNOSIS — Z794 Long term (current) use of insulin: Secondary | ICD-10-CM | POA: Diagnosis not present

## 2016-09-10 DIAGNOSIS — E1122 Type 2 diabetes mellitus with diabetic chronic kidney disease: Secondary | ICD-10-CM | POA: Diagnosis not present

## 2016-09-10 DIAGNOSIS — Z992 Dependence on renal dialysis: Secondary | ICD-10-CM | POA: Diagnosis not present

## 2016-09-10 DIAGNOSIS — J449 Chronic obstructive pulmonary disease, unspecified: Secondary | ICD-10-CM | POA: Insufficient documentation

## 2016-09-10 DIAGNOSIS — N186 End stage renal disease: Secondary | ICD-10-CM | POA: Insufficient documentation

## 2016-09-10 DIAGNOSIS — M549 Dorsalgia, unspecified: Secondary | ICD-10-CM

## 2016-09-10 MED ORDER — HYDROCODONE-ACETAMINOPHEN 5-325 MG PO TABS
1.0000 | ORAL_TABLET | Freq: Once | ORAL | Status: AC
  Administered 2016-09-10: 1 via ORAL
  Filled 2016-09-10: qty 1

## 2016-09-10 MED ORDER — METHOCARBAMOL 500 MG PO TABS
500.0000 mg | ORAL_TABLET | Freq: Once | ORAL | Status: AC
  Administered 2016-09-10: 500 mg via ORAL
  Filled 2016-09-10: qty 1

## 2016-09-10 MED ORDER — METHOCARBAMOL 500 MG PO TABS: 500.0000 mg | ORAL_TABLET | Freq: Two times a day (BID) | ORAL | 0 refills | Status: DC

## 2016-09-10 MED ORDER — HYDROCODONE-ACETAMINOPHEN 5-325 MG PO TABS: 1.0000 | ORAL_TABLET | ORAL | 0 refills | Status: DC | PRN

## 2016-09-10 NOTE — ED Triage Notes (Signed)
Pt presents with c/o L sided mid back pain since yesterday. Pain has been constant since onset. Pain increases with walking. She denies any recent straining, injuries, falls. She denies n/v, bowel changes, fevers. She is anuric, hemodialysis pt who has been going to dialysis on regular schedule.

## 2016-09-10 NOTE — ED Provider Notes (Signed)
[facility] DEPT Provider Note   CSN: [id number] Arrival date & time: 09/10/16  1354   History   Chief Complaint Chief Complaint  Patient presents with  . Back Pain   HPI   [name] is an 55 y.o. female with history of ESRD on dialysis (T/Th/Sat schedule), chronic hypotension, COPD, DM, who presents to the ED for evaluation of left sided back pain since yesterday. States it developed over the course of the day and she does not remember injuring it. Denies heavy lifting. States the pain does not radiate. The pain is worse when she's up walking around or moving around. Denies new numbness, weakness, tingling. Denies fever, chills, abd pain, n/v/d. She states she has not missed a dialysis session. No chest pain or SOB, does not feel faint or light headed, no abdominal pain. She has not tried anything for the pain. Denies bowel/bladder incontinence or saddle paresthesias.  Past Medical History:  Diagnosis Date  . Chronic hypotension   . Complication of anesthesia    confusion  . COPD (chronic obstructive pulmonary disease) ([place])   . 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 ESRD (end stage renal disease) on dialysis [facility])    "TTS; [facility]; (03/25/2014)  . GERD (gastroesophageal reflux disease)   . HTN (hypertension)   . Iron deficiency anemia   . Obesities, morbid ([place])   . OSA on CPAP   . PE (pulmonary embolism) ~ 2010  . Pneumonia    "now & once a long time ago" (03/26/2013)  . Shortness of breath    "just a little bit; at any time" (03/26/2013)  . Superior vena cava syndrome    Archie Endo 03/26/2013    Patient Active Problem List   Diagnosis Date Noted  . Bacteremia due to Staphylococcus   . Infection and inflammatory reaction due to cardiac device, implant, and graft ([place])   . Staphylococcus aureus bacteremia with sepsis ([place])   . Severe sepsis with septic shock ([place])   . Screen for STD (sexually transmitted disease)   . Pressure ulcer  08/14/2015  . Sepsis ([place]) 08/12/2015  . SIRS (systemic inflammatory response syndrome) ([place]) 08/12/2015  . Diabetes mellitus type 2 in obese ([place]) 08/12/2015  . OSA on CPAP 08/12/2015  . Chronic hypotension 08/12/2015  . COPD (chronic obstructive pulmonary disease) ([place]) 08/12/2015  . Blood poisoning ([place])   . Hypercapnia 08/12/2014  . Acute pulmonary edema ([place]) 08/12/2014  . Acute respiratory failure with hypoxia ([place]) 08/10/2014  . COPD with acute exacerbation ([place]) 03/25/2014  . Fever and chills 03/26/2013  . Cough 03/26/2013  . Diarrhea 03/26/2013  . ESRD on hemodialysis ([place]) 03/26/2013  . OSA (obstructive sleep apnea) 03/26/2013  . Anemia of chronic disease 03/26/2013  . Secondary hyperparathyroidism ([place]) 03/26/2013  . Insulin-requiring or dependent type II diabetes mellitus ([place]) 03/26/2013    Past Surgical History:  Procedure Laterality Date  . ARTERIOVENOUS GRAFT PLACEMENT Left ~ 2011   "thigh" (4  . CARPAL TUNNEL RELEASE Right ~ 2011  . CARPAL TUNNEL RELEASE Right 01/28/2015   Procedure: RIGHT CARPAL TUNNEL RELEASE;  Surgeon: Roseanne Kaufman, MD;  Location: [facility];  Service: Orthopedics;  Laterality: Right;  . REVISION OF ARTERIOVENOUS GORETEX GRAFT Left 08/14/2015   Procedure: REVISION OF ARTERIOVENOUS GORETEX GRAFT LEFT THIGH with  REMOVAL of segment of infected graft.;  Surgeon: Angelia Mould, MD;  Location: [facility];  Service: Vascular;  Laterality: Left;  . THROMBECTOMY AND REVISION  OF ARTERIOVENTOUS (AV) GORETEX  GRAFT Left ~ 12/2012   "thigh"   . VASCULAR SURGERY      OB History    No data available       Home Medications    Prior to Admission medications   Medication Sig Start Date End Date Taking? Authorizing Provider  albuterol (PROVENTIL HFA;VENTOLIN HFA) 108 (90 BASE) MCG/ACT inhaler Inhale 2 puffs into the lungs every 6 (six) hours as needed. For shortness of breath. Patient taking differently: Inhale 2 puffs into the lungs every 6 (six) hours  as needed for wheezing or shortness of breath.  03/27/14   Sharnese Oats, MD  ATROVENT HFA 17 MCG/ACT inhaler Inhale 1 puff into the lungs every 6 (six) hours as needed. Wheezing 01/03/16   Historical Provider, MD  B Complex-C (B-COMPLEX WITH VITAMIN C) tablet Take 1 tablet by mouth daily.    Historical Provider, MD  benzonatate (TESSALON) 100 MG capsule Take 2 capsules (200 mg total) by mouth 2 (two) times daily as needed for cough. 01/05/16   Nona Dell, PA-C  calcium acetate (PHOSLO) 667 MG capsule Take 1,334 mg by mouth 3 (three) times daily with meals.     Historical Provider, MD  calcium elemental as carbonate (TUMS ULTRA 1000) 400 MG tablet Chew 1,000 mg by mouth daily.    Historical Provider, MD  HYDROcodone-acetaminophen (NORCO/VICODIN) 5-325 MG tablet Take 1-2 tablets by mouth every 4 (four) hours as needed for severe pain. 09/10/16   Olivia Canter Camilia Caywood, PA-C  insulin aspart (NOVOLOG FLEXPEN) 100 UNIT/ML injection Inject 12-15 Units into the skin 3 (three) times daily before meals. Per sliding scale    Historical Provider, MD  insulin glargine (LANTUS) 100 UNIT/ML injection Inject 0.08 mLs (8 Units total) into the skin at bedtime. 08/18/15   Maryann Mikhail, DO  methocarbamol (ROBAXIN) 500 MG tablet Take 1 tablet (500 mg total) by mouth 2 (two) times daily. 09/10/16   Olivia Canter Devone Bonilla, PA-C  metoCLOPramide (REGLAN) 5 MG tablet Take 5 mg by mouth at bedtime.      Historical Provider, MD  midodrine (PROAMATINE) 10 MG tablet Take 1 tablet (10 mg total) by mouth 3 (three) times daily with meals. 08/18/15   Maryann Mikhail, DO  ondansetron (ZOFRAN ODT) 4 MG disintegrating tablet Take 1 tablet (4 mg total) by mouth every 8 (eight) hours as needed for nausea or vomiting. 01/05/16   Nona Dell, PA-C  oxyCODONE (OXY IR/ROXICODONE) 5 MG immediate release tablet Take 1 tablet (5 mg total) by mouth every 4 (four) hours as needed for severe pain. 01/28/15   Roseanne Kaufman, MD  oxymetazoline (AFRIN  NASAL SPRAY) 0.05 % nasal spray Place 1 spray into both nostrils 2 (two) times daily. 01/05/16   Nona Dell, PA-C  predniSONE (DELTASONE) 10 MG tablet 5,4,3,2,1 taper 04/10/16   Fransico Meadow, PA-C  tiotropium (SPIRIVA) 18 MCG inhalation capsule Place 18 mcg into inhaler and inhale daily.    Historical Provider, MD  vancomycin (VANCOCIN) 1 GM/200ML SOLN Inject 200 mLs (1,000 mg total) into the vein Every Tuesday,Thursday,and Saturday with dialysis. 08/18/15   Cristal Ford, DO    Family History Family History  Problem Relation Age of Onset  . Diabetes Mellitus II Mother   . Hypertension Mother   . Hypertension Father   . Diabetes Mellitus II Father     Social History Social History  Substance Use Topics  . Smoking status: Never Smoker  . Smokeless tobacco: Never Used  .  Alcohol use No     Allergies   Amoxicillin; Iohexol; and Penicillins   Review of Systems Review of Systems 10 Systems reviewed and are negative for acute change except as noted in the HPI.   Physical Exam Updated Vital Signs BP (!) 81/55 (BP Location: Right Arm)   Pulse 82   Temp 97.7 F (36.5 C) (Oral)   Resp 20   Wt 119 kg   SpO2 97%   BMI 49.57 kg/m   Physical Exam  Constitutional: She is oriented to person, place, and time. No distress.  Obese, NAD  HENT:  Head: Atraumatic.  Right Ear: External ear normal.  Left Ear: External ear normal.  Nose: Nose normal.  Eyes: Conjunctivae are normal. No scleral icterus.  Cardiovascular: Normal rate and regular rhythm.   Pulmonary/Chest: Effort normal. No respiratory distress.  Abdominal: She exhibits no distension.  Musculoskeletal:  Left thoracic paraspinal ttp. No midline back tenderness. No stepoff or deformity Moves all extremities freely  Neurological: She is alert and oriented to person, place, and time.  5/5 strength throughout Steady gait  Skin: Skin is warm and dry. She is not diaphoretic.  Psychiatric: She has a normal mood  and affect. Her behavior is normal.  Nursing note and vitals reviewed.    ED Treatments / Results  Labs (all labs ordered are listed, but only abnormal results are displayed) Labs Reviewed - No data to display  EKG  EKG Interpretation None       Radiology No results found.  Procedures Procedures (including critical care time)  Medications Ordered in ED Medications  HYDROcodone-acetaminophen (NORCO/VICODIN) 5-325 MG per tablet 1 tablet (1 tablet Oral Given 09/10/16 1738)  methocarbamol (ROBAXIN) tablet 500 mg (500 mg Oral Given 09/10/16 1737)     Initial Impression / Assessment and Plan / ED Course  I have reviewed the triage vital signs and the nursing notes.  Pertinent labs & imaging results that were available during my care of the patient were reviewed by me and considered in my medical decision making (see chart for details).  Clinical Course    Pt presenting with left sided thoracic back pain since yesterday. No known injury. No red flags for cauda equina. Neuro exam intact and reassuring. Likely musculoskeletal. Pt is an ESRD pt but has not missed any dialysis sessions and has no other associated signs or symptoms. We will hold off on bloodwork today and she will go to dialysis as scheduled tomorrow. Notably pt's BP is low but this is baseline for her. No chest pain or SOB, does not feel faint or light headed, no abdominal pain. Doubt dissection or ruptured aneurysm. Will f/u with PCP. Strict ER return precautions given.  Final Clinical Impressions(s) / ED Diagnoses   Final diagnoses:  Acute left-sided back pain, unspecified back location    New Prescriptions Discharge Medication List as of 09/10/2016  5:12 PM    START taking these medications   Details  HYDROcodone-acetaminophen (NORCO/VICODIN) 5-325 MG tablet Take 1-2 tablets by mouth every 4 (four) hours as needed for severe pain., Starting Mon 09/10/2016, Print    methocarbamol (ROBAXIN) 500 MG tablet Take 1  tablet (500 mg total) by mouth 2 (two) times daily., Starting Mon 09/10/2016, Print         Anne Ng, PA-C 09/11/16 1526    Sherwood Gambler, MD 09/11/16 1723

## 2016-09-10 NOTE — Discharge Instructions (Signed)
Go to dialysis tomorrow as scheduled. Follow up with your primary care provider this week. Take medication as prescribed. Return to the ER for new or worsening symptoms.

## 2016-12-26 IMAGING — CR DG CHEST 1V PORT
1 series · 1 of 1 positions shown · non-contrast
Comparison: 08/12/2015 and earlier.

CLINICAL DATA: 53-year-old female with hypoxia. Initial encounter.

EXAM:
PORTABLE CHEST - 1 VIEW

[AP]
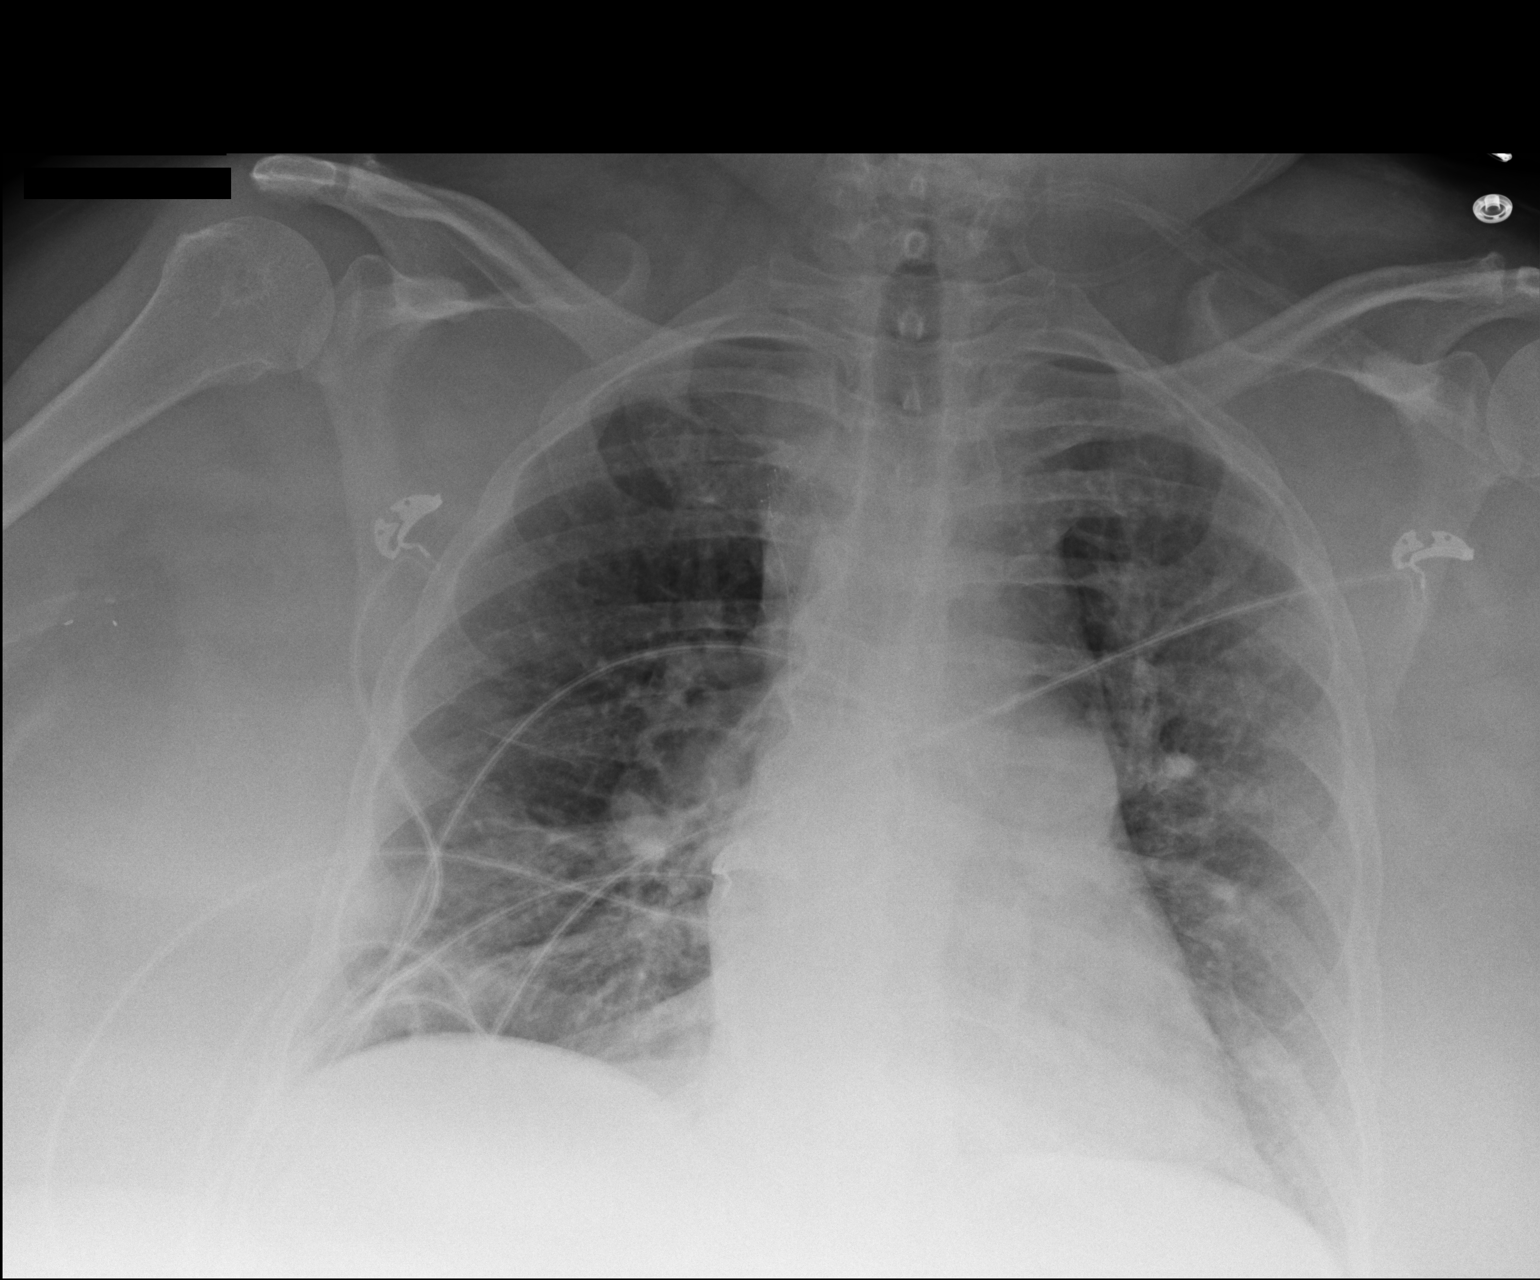

[1 of 1 positions shown; findings below may reference images not displayed]

FINDINGS: Portable AP semi upright view at 8058 hours. Stable lung volumes.
Stable cardiac size and mediastinal contours. No pneumothorax. No
pleural effusion or consolidation. Stable pulmonary vascularity
without overt edema. Visualized tracheal air column is within normal
limits.
IMPRESSION: Stable pulmonary vascular congestion without overt edema.

## 2017-05-01 IMAGING — DX DG KNEE COMPLETE 4+V*R*
4 series · 4 of 4 positions shown · non-contrast
Comparison: No prior.

CLINICAL DATA: Fall.

EXAM:
RIGHT KNEE - COMPLETE 4+ VIEW

[t knee ap right]
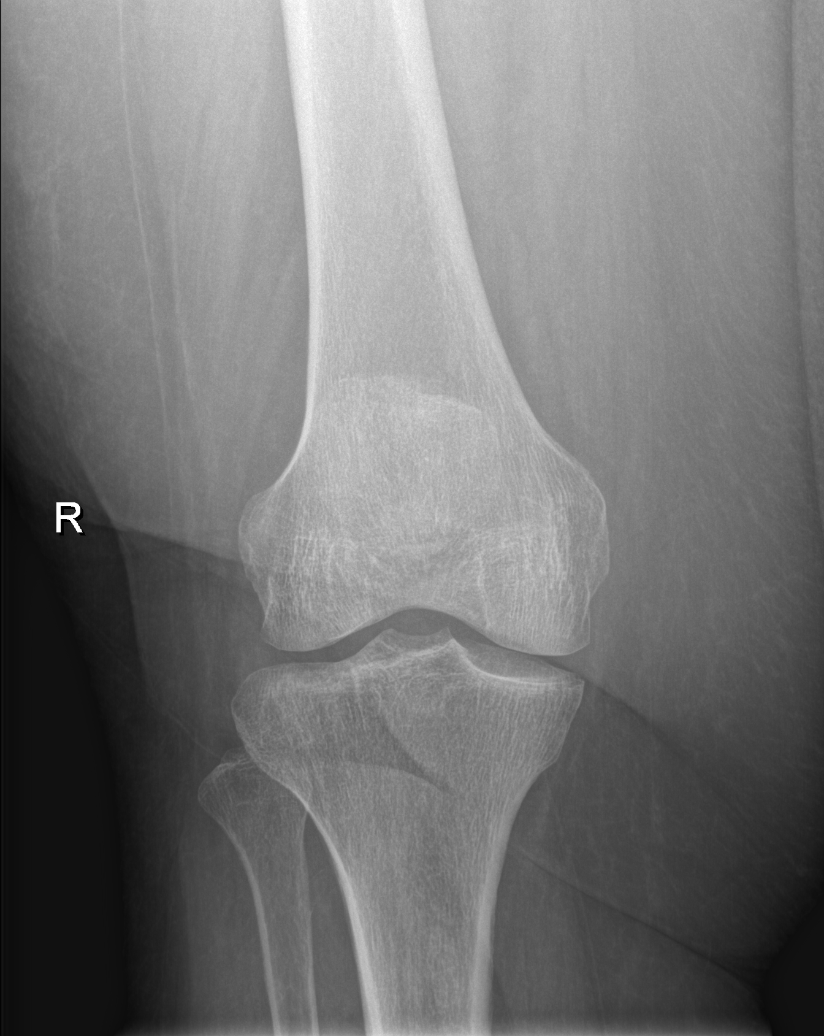

[t knee obl right (1 of 2)]
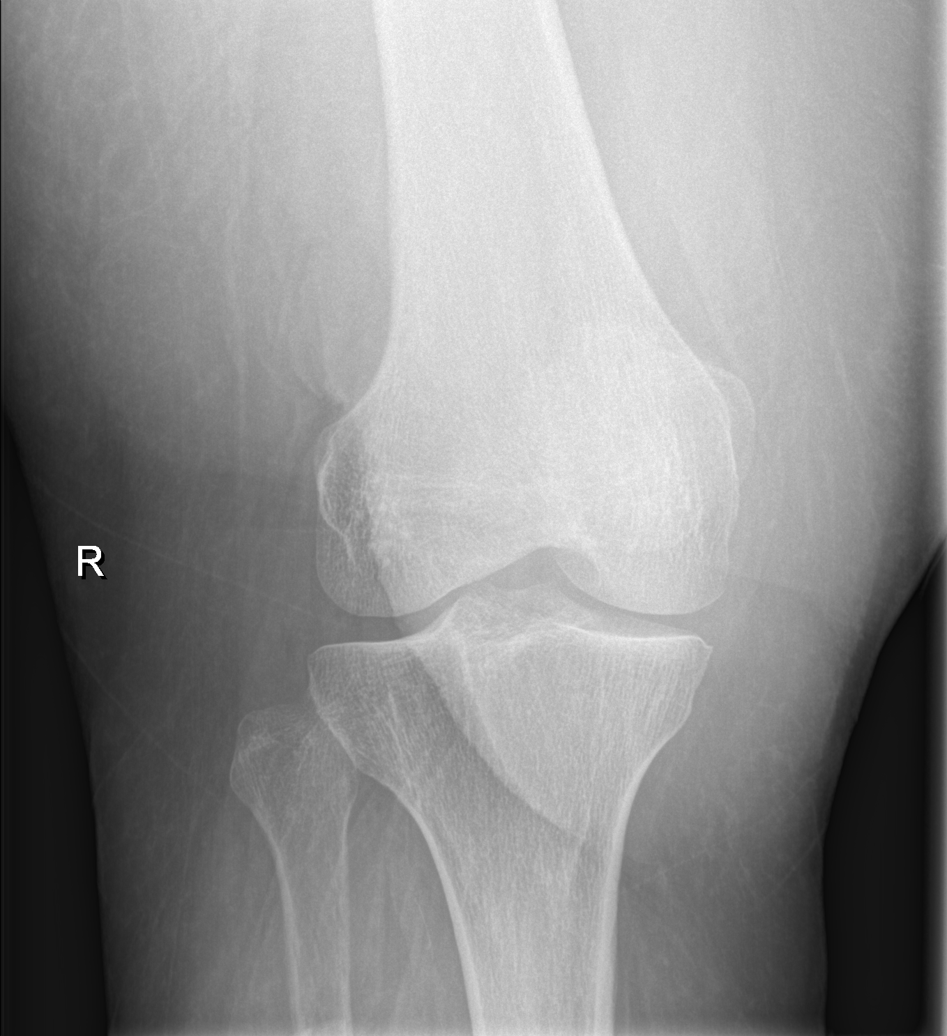

[t knee obl right (2 of 2)]
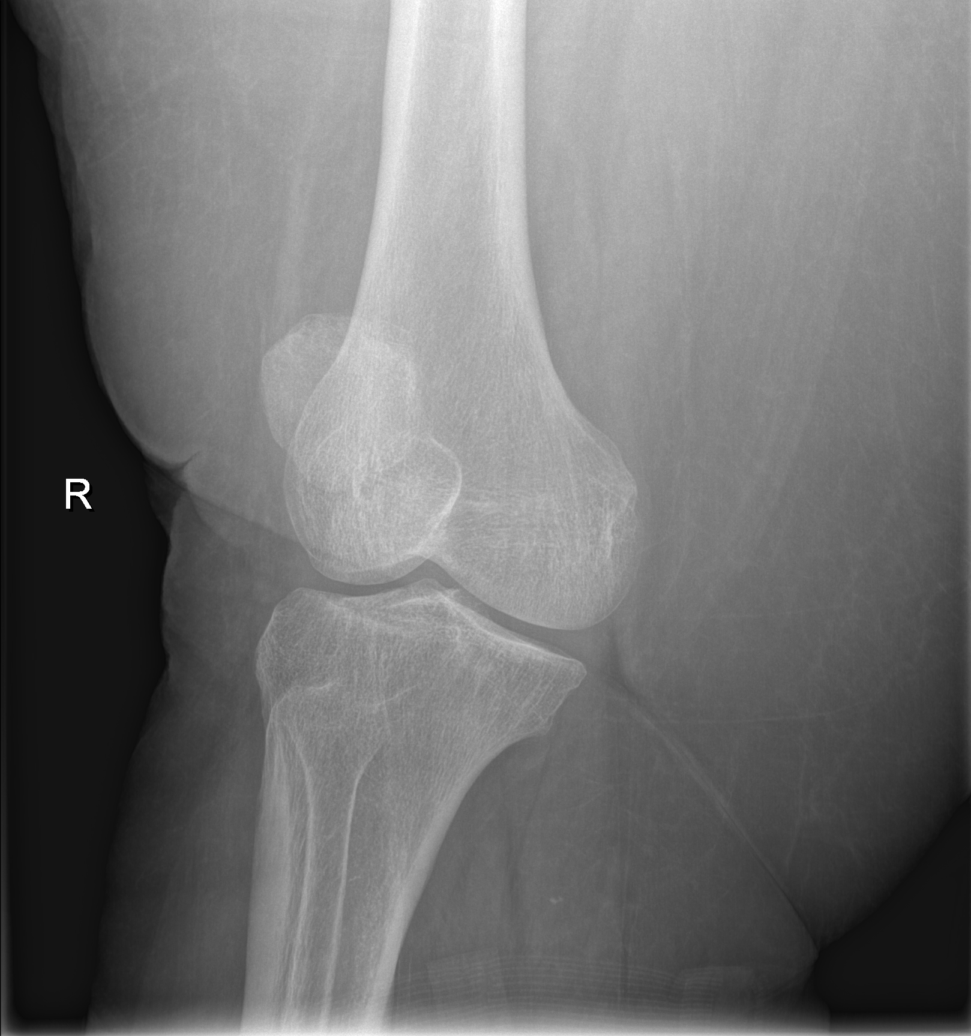

[t knee lat right]
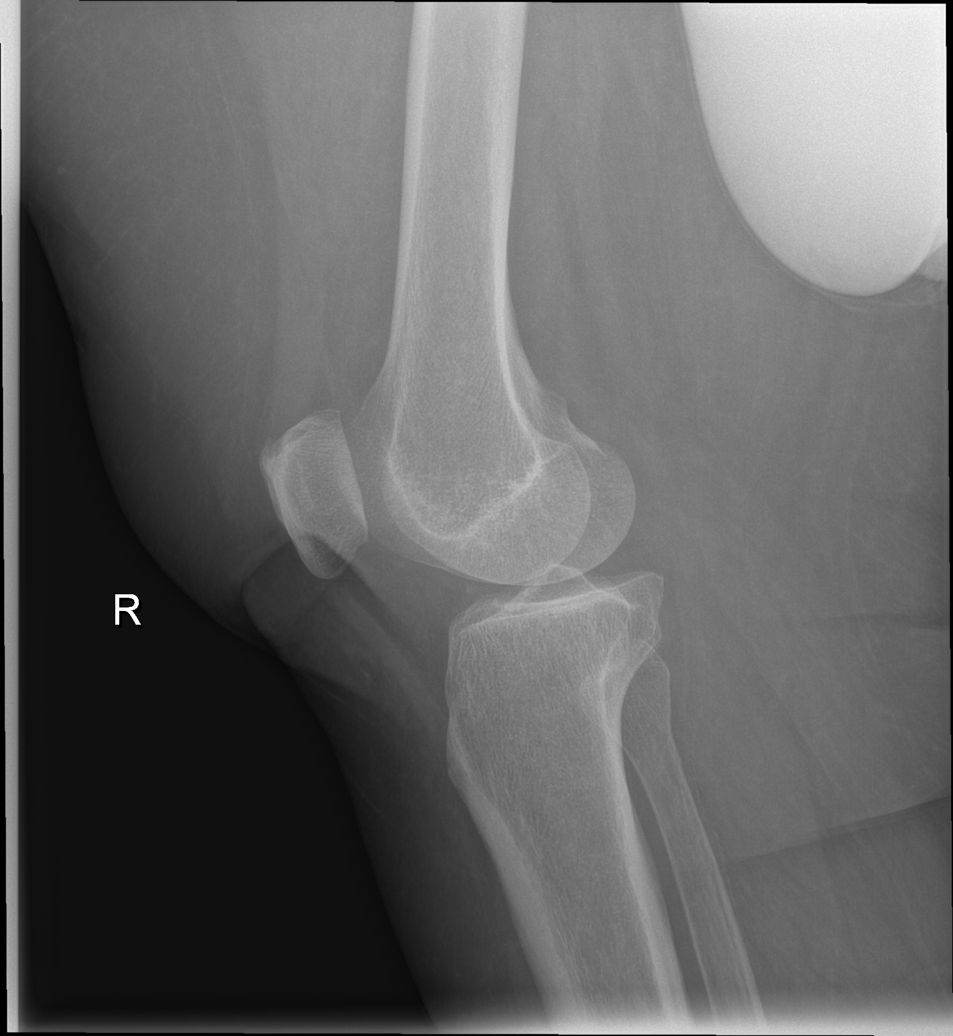

[4 of 4 positions shown; findings below may reference images not displayed]

FINDINGS: No acute bony or joint abnormality identified. No evidence of
fracture or dislocation .
IMPRESSION: No acute abnormality.

## 2017-05-01 IMAGING — CR DG ANKLE COMPLETE 3+V*R*
3 series · 3 of 3 positions shown · non-contrast
Comparison: None.

CLINICAL DATA: Right ankle pain after falling down stairs today.

EXAM:
RIGHT ANKLE - COMPLETE 3+ VIEW

[ankle ap]
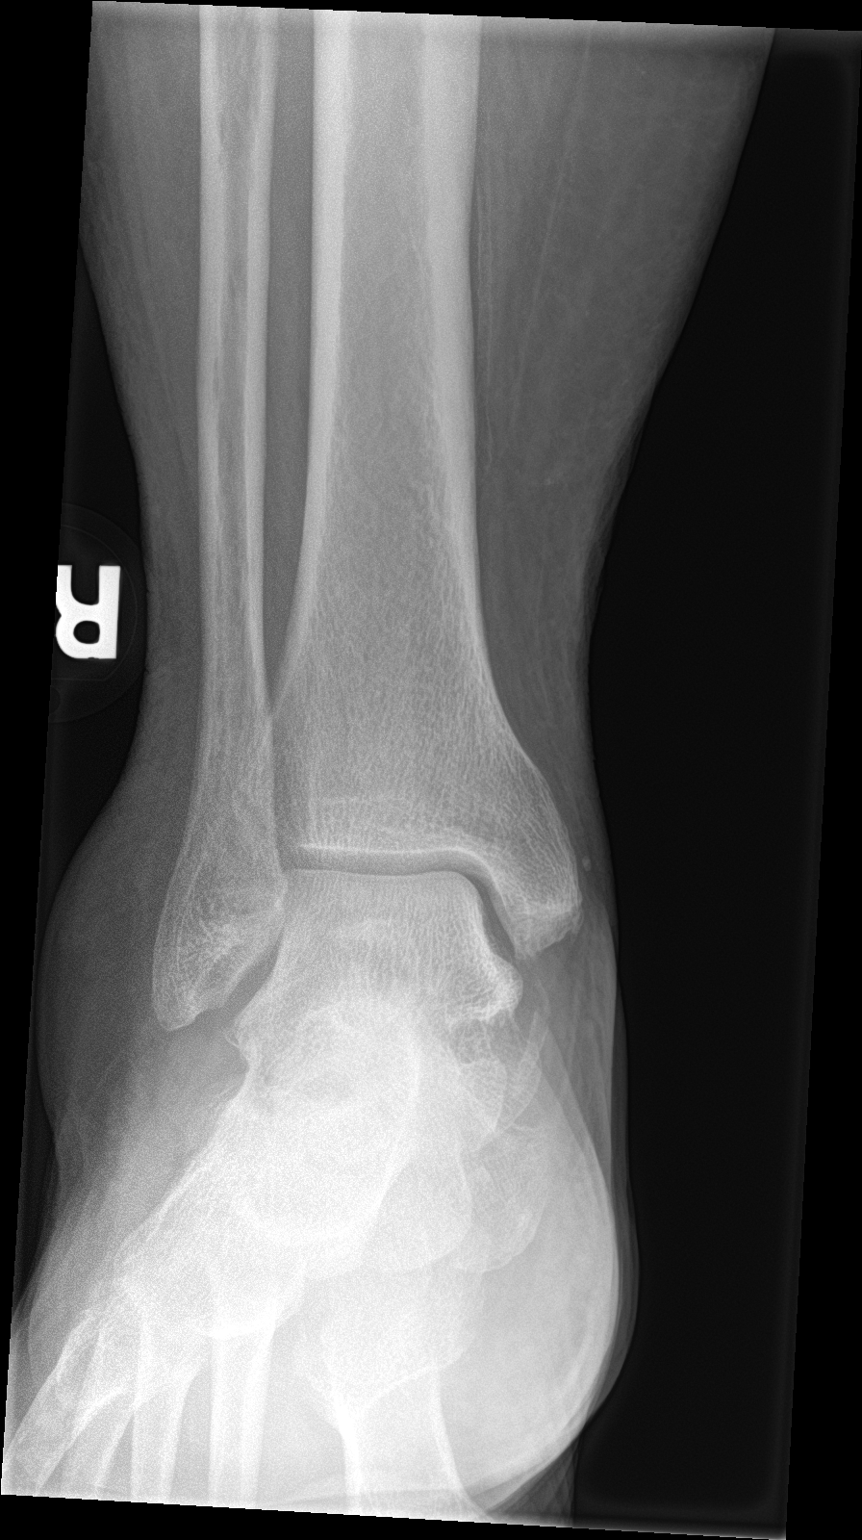

[ankle obl]
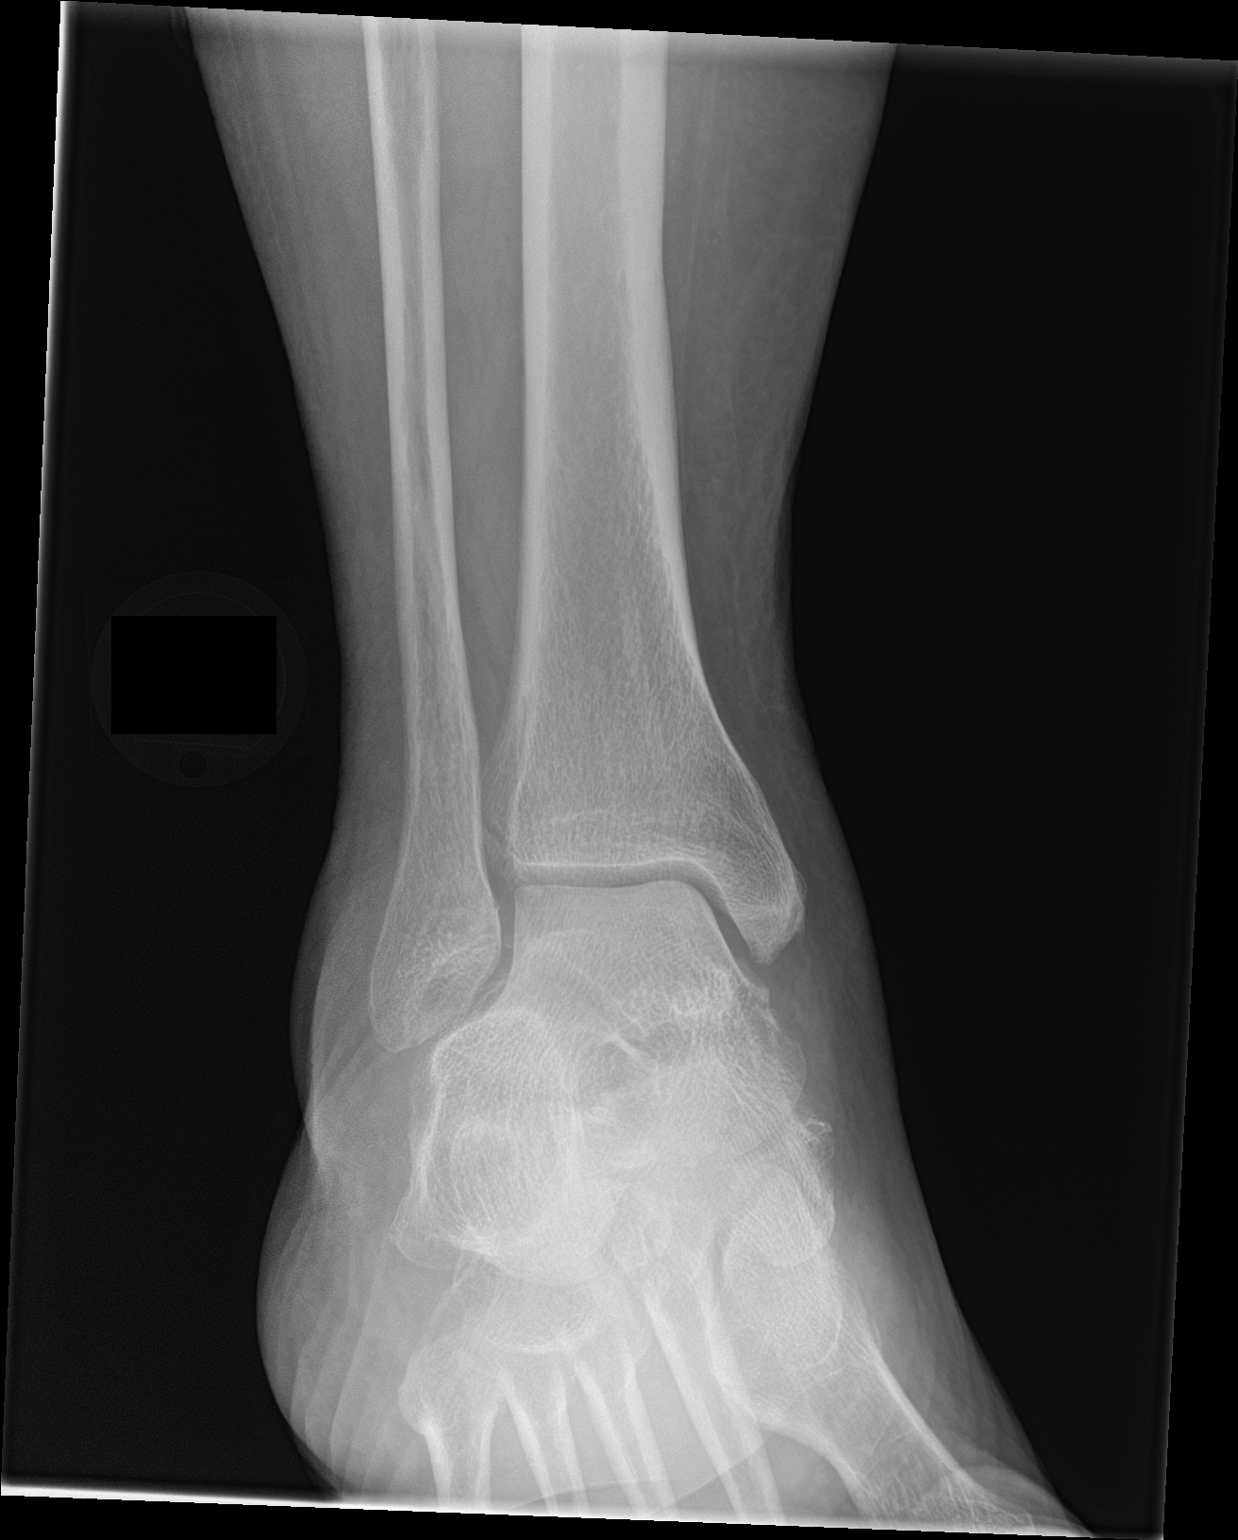

[ankle lat]
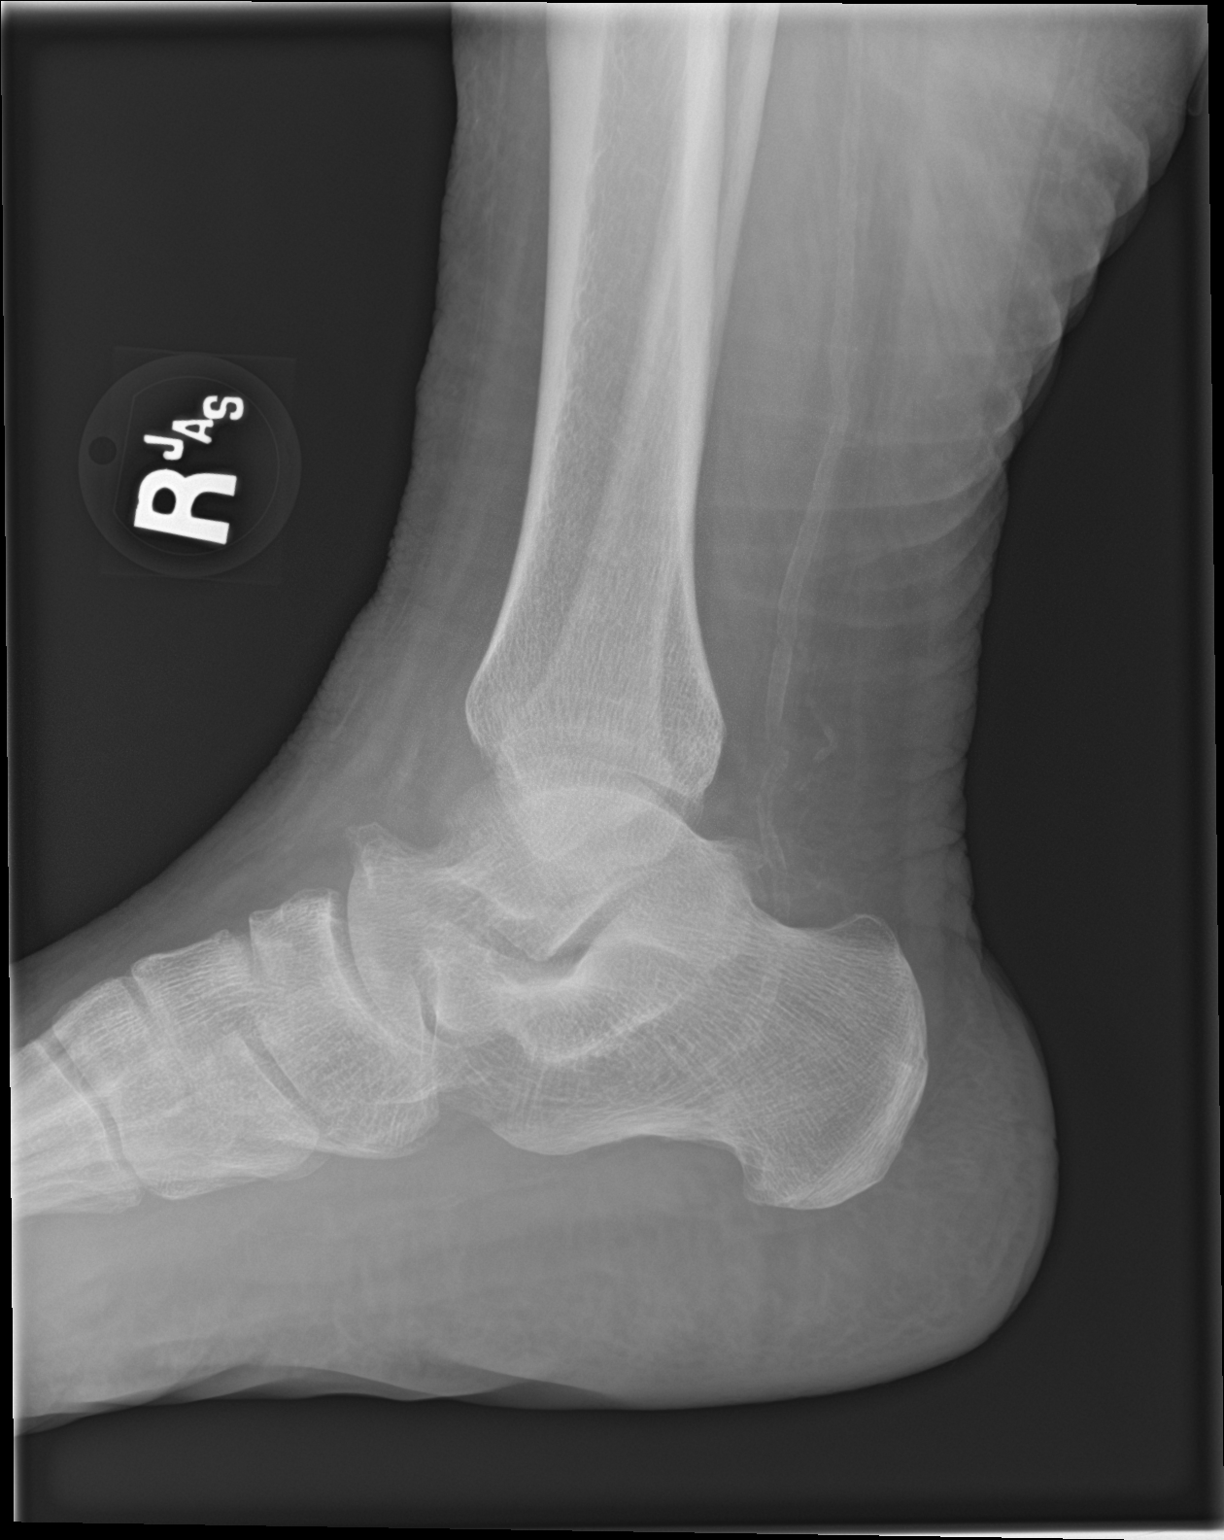

[3 of 3 positions shown; findings below may reference images not displayed]

FINDINGS: The mineralization and alignment are normal. There is no evidence of
acute fracture or dislocation. The joint spaces are maintained.
There is prominent dorsal talar beaking. There is also prominent
lateral soft tissue swelling. Vascular calcifications suggest
underlying diabetes.
IMPRESSION: No acute osseous findings.  Lateral soft tissue swelling.

## 2017-05-18 IMAGING — CR DG CHEST 2V
2 series · 2 of 2 positions shown · non-contrast
Comparison: 08/15/2015

CLINICAL DATA: Productive cough, shortness of breath, body aches

EXAM:
CHEST  2 VIEW

[chest pa]
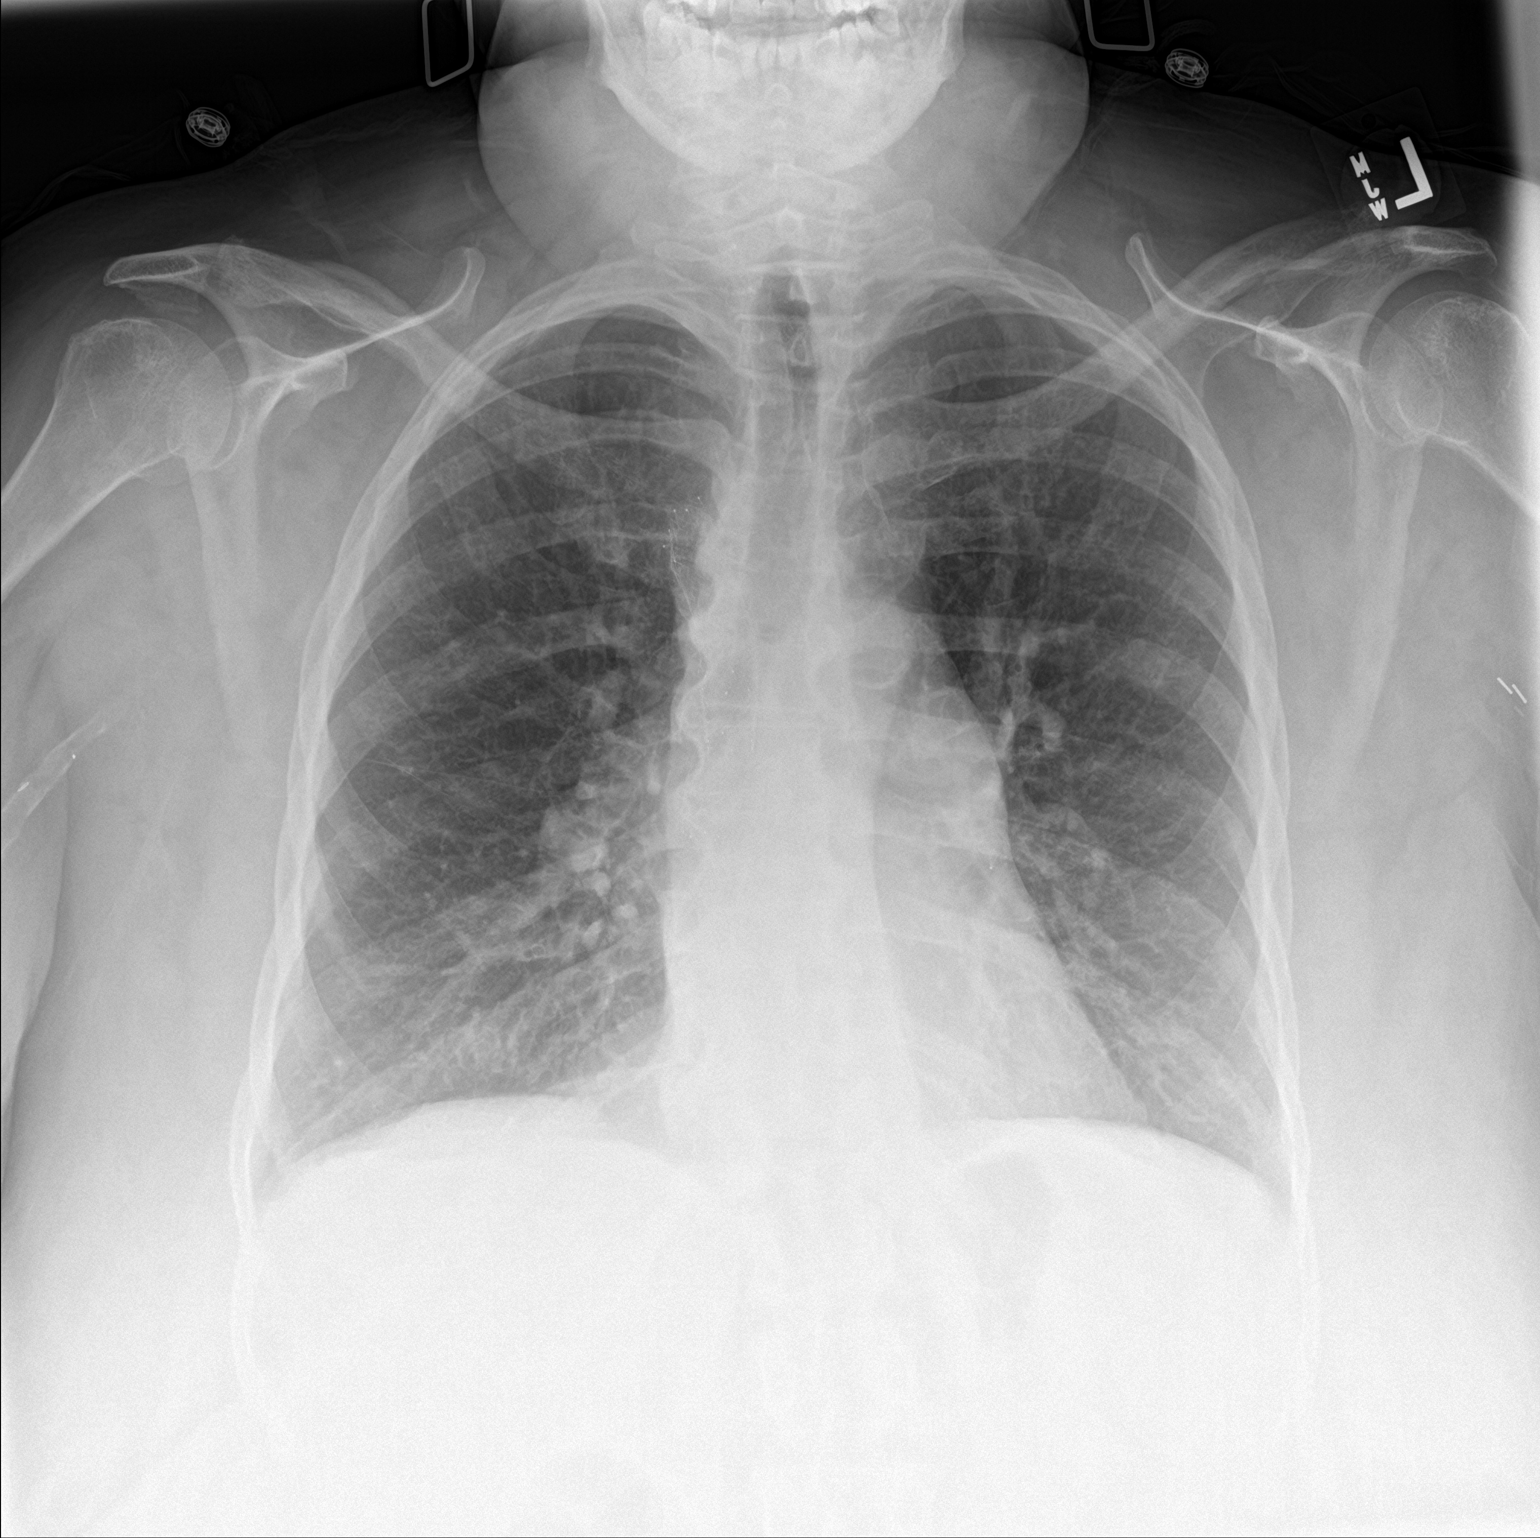

[chest lat]
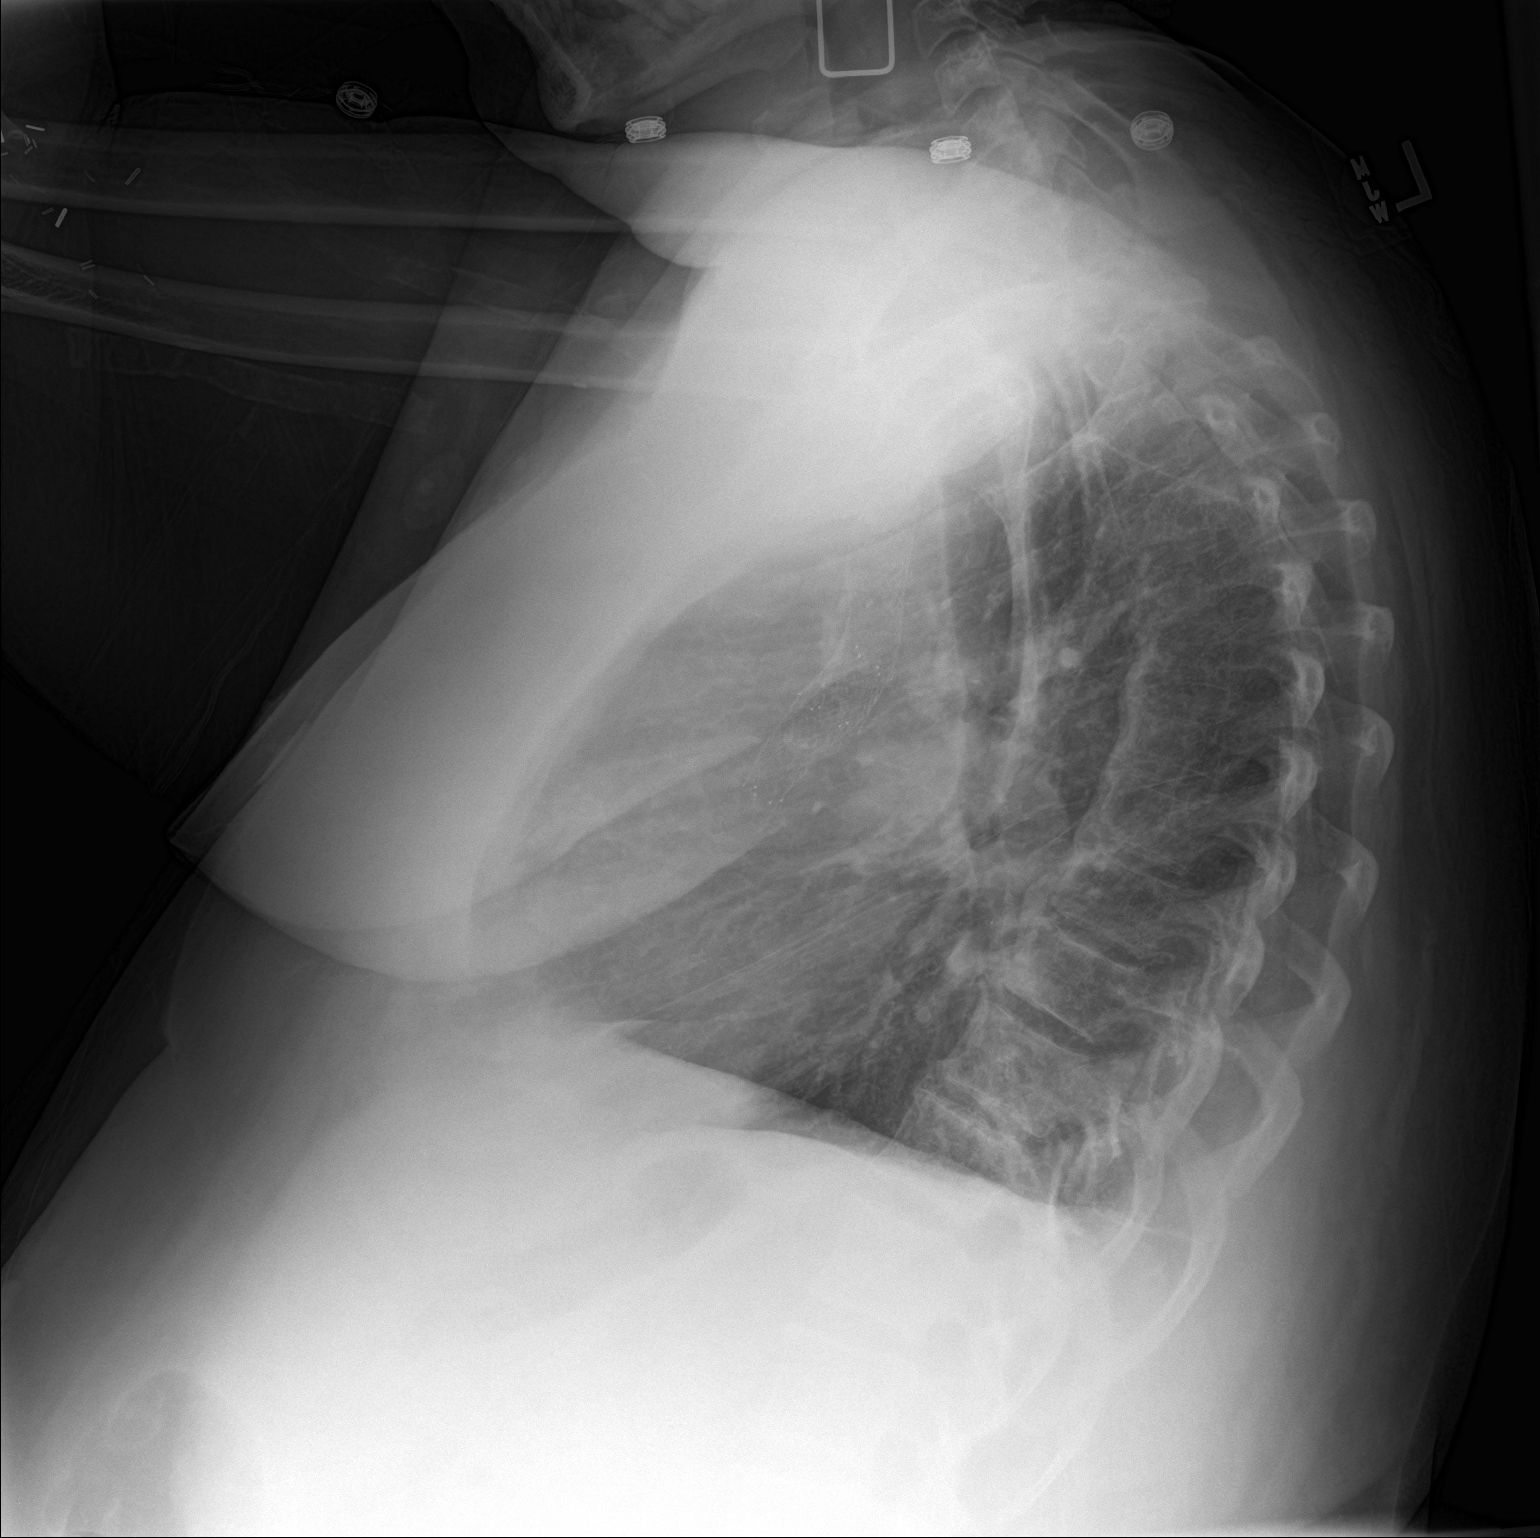

[2 of 2 positions shown; findings below may reference images not displayed]

FINDINGS: Cardiomediastinal silhouette is stable. No acute infiltrate or
pulmonary edema. Vascular stent in the region of SVC again noted.
Mild degenerative changes thoracic spine.
IMPRESSION: No active disease.  Degenerative changes thoracic spine.

## 2017-08-22 IMAGING — CR DG CHEST 2V
2 series · 2 of 2 positions shown · non-contrast
Comparison: 01/05/2016 chest radiograph.

CLINICAL DATA: Chest pain

EXAM:
CHEST  2 VIEW

[chest pa]
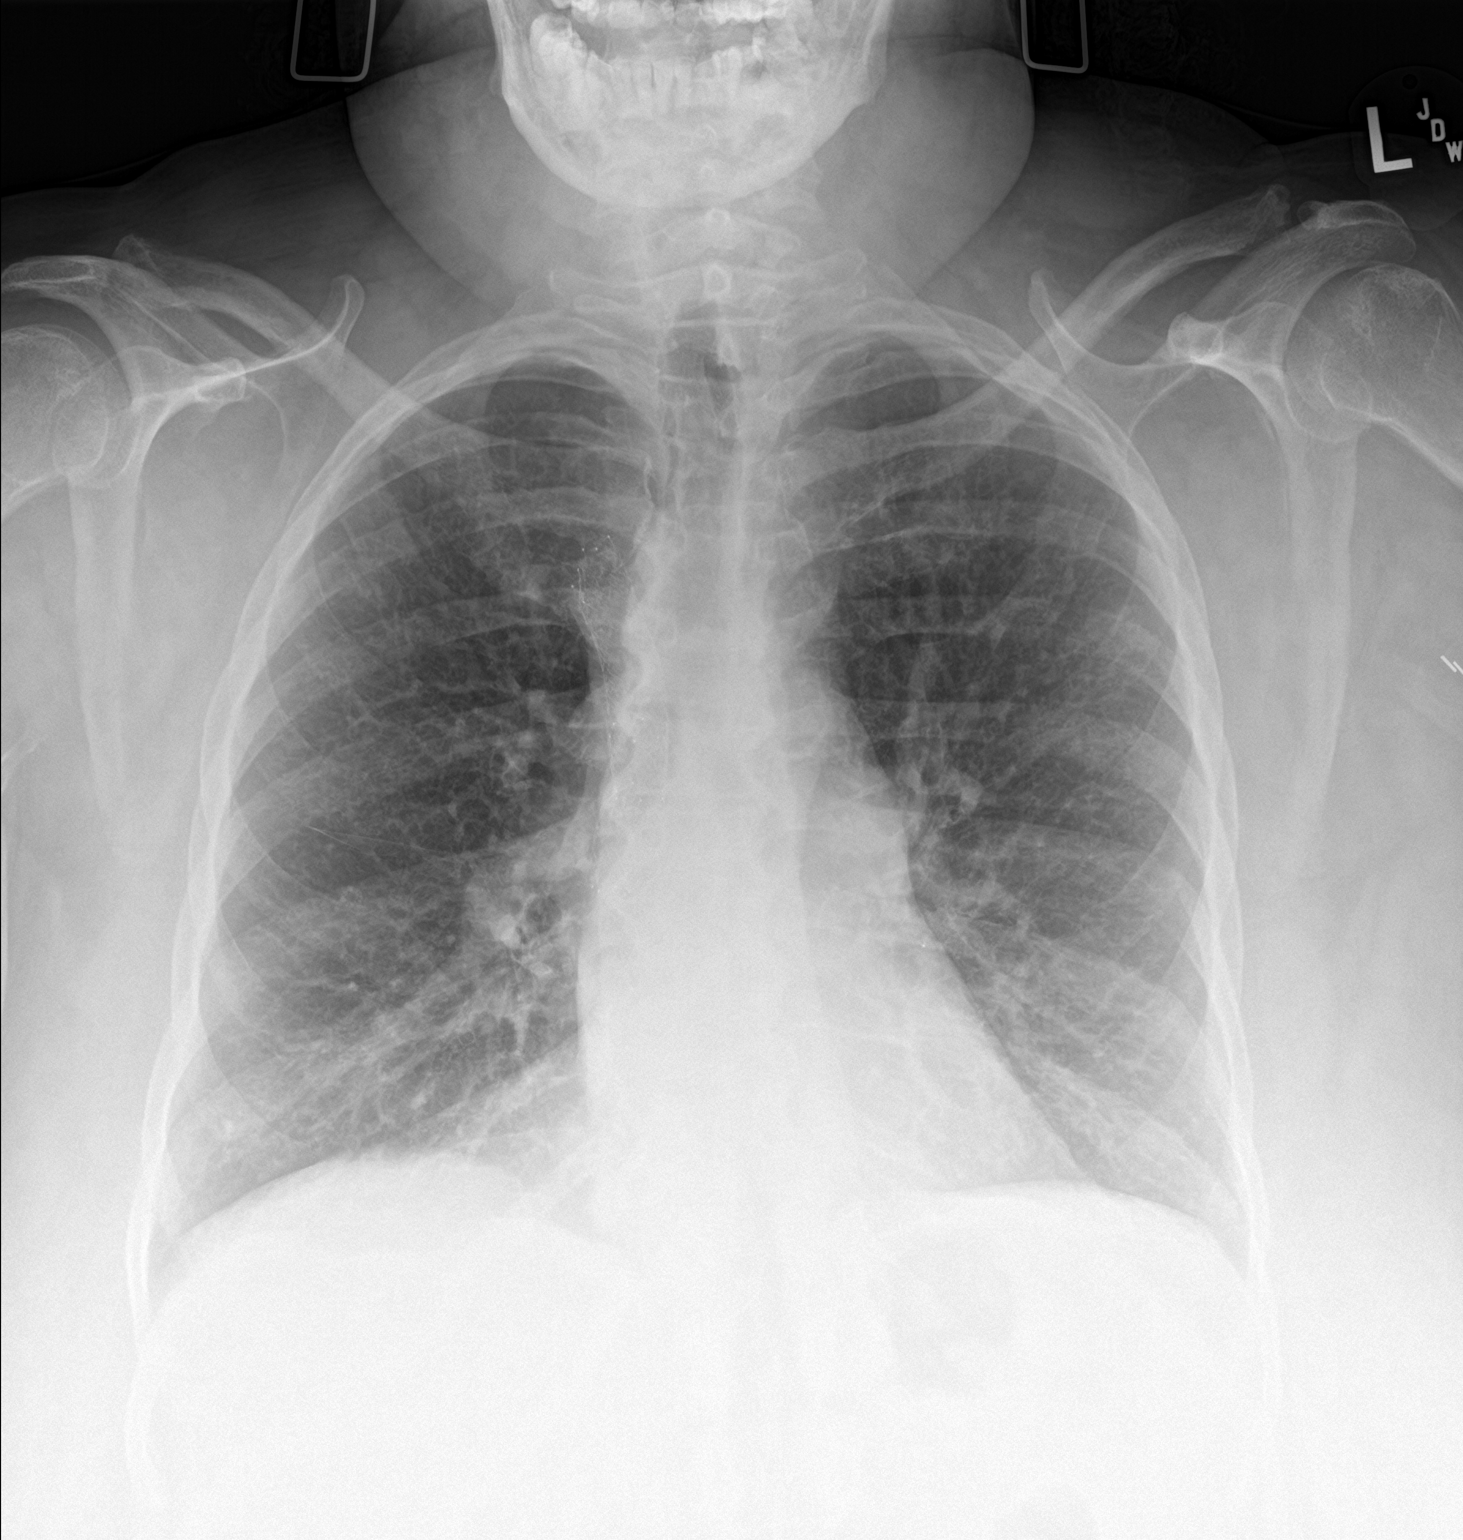

[chest lat]
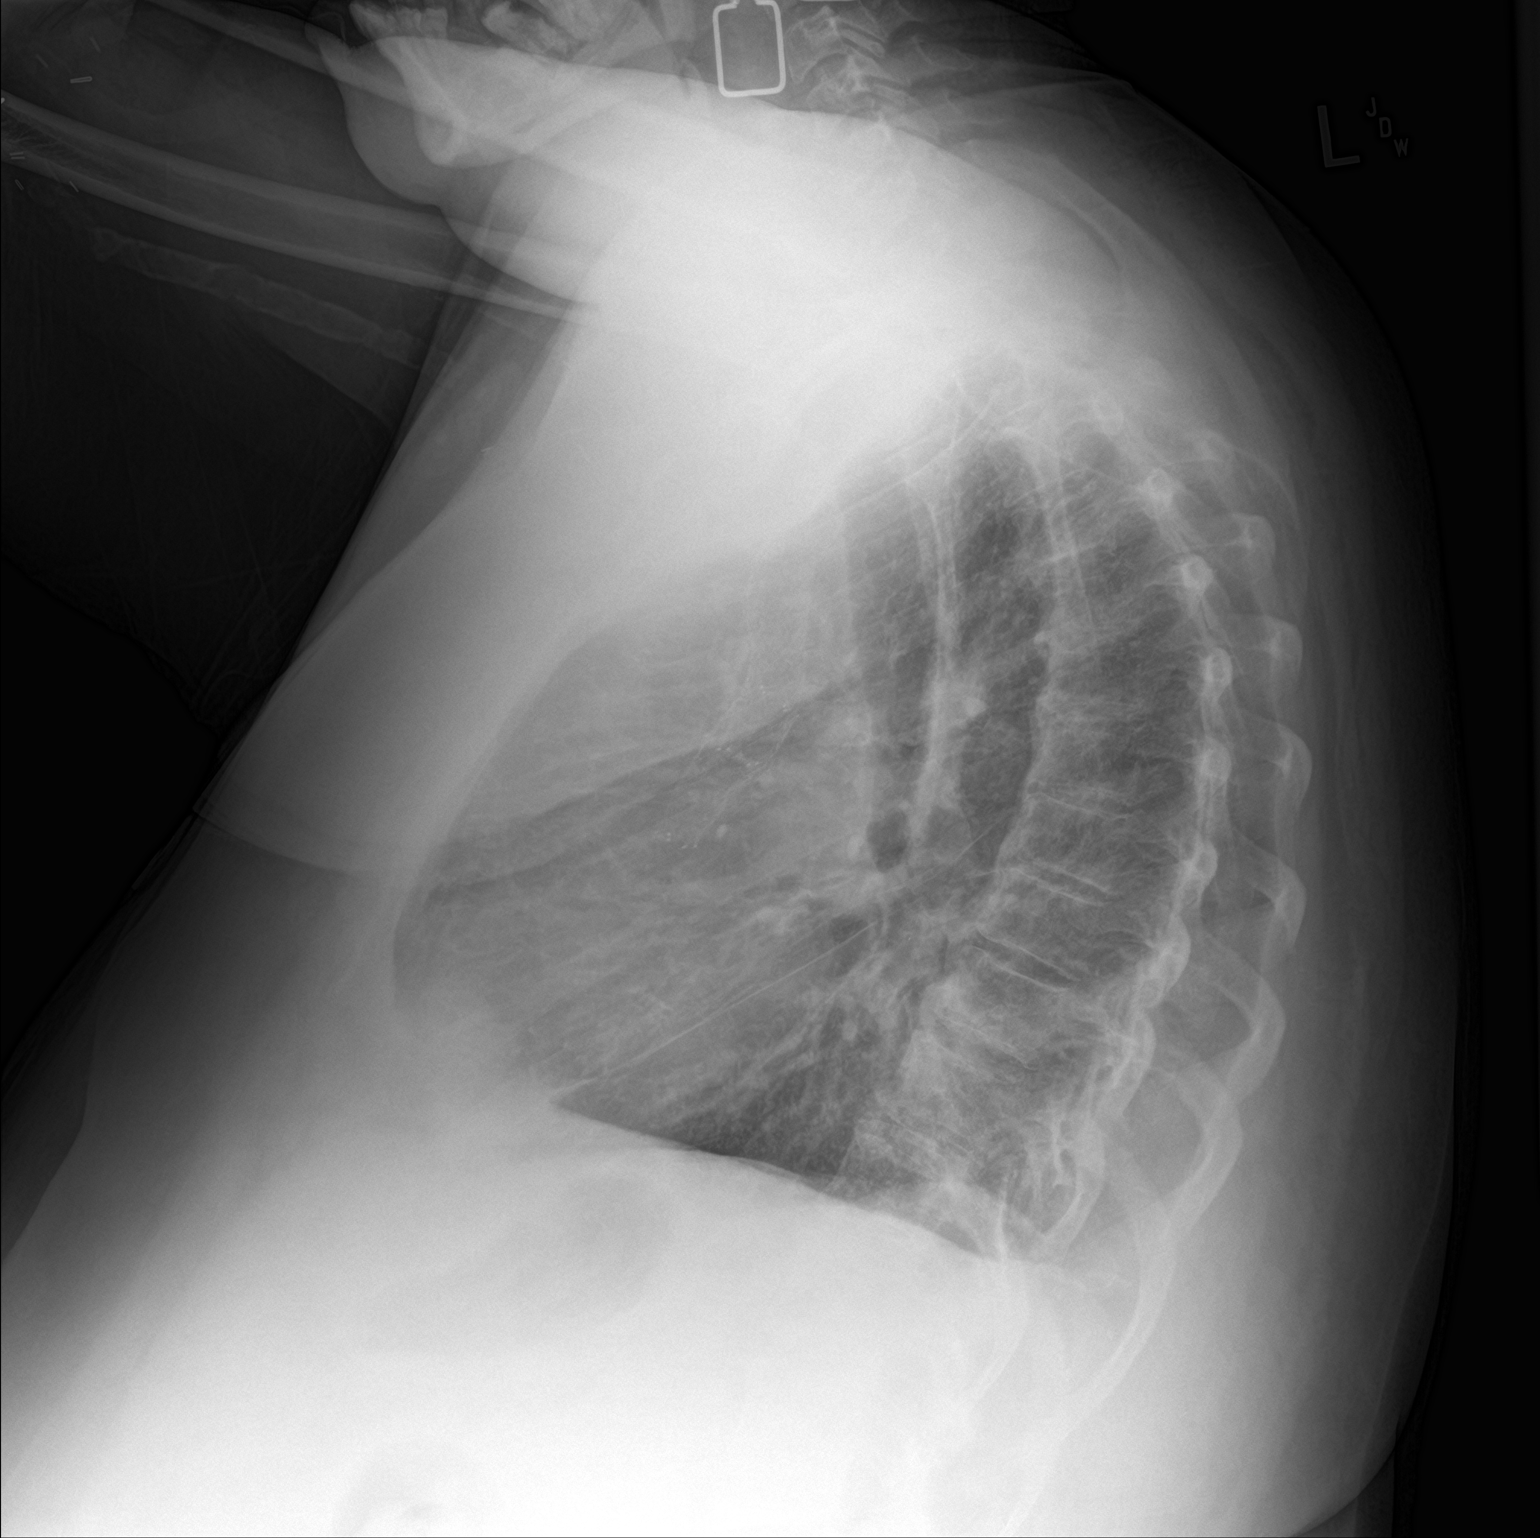

[2 of 2 positions shown; findings below may reference images not displayed]

FINDINGS: Vascular stent is in stable position in the right anterior
mediastinum. Stable cardiomediastinal silhouette with normal heart
size. No pneumothorax. No pleural effusion. Lungs appear clear, with
no acute consolidative airspace disease and no pulmonary edema.
Partially visualized vascular stent in the medial proximal right
upper extremity. Partially visualized surgical clips in the medial
proximal left upper extremity.
IMPRESSION: No active cardiopulmonary disease.

## 2018-06-22 ENCOUNTER — Other Ambulatory Visit: Payer: Self-pay

## 2018-06-22 ENCOUNTER — Emergency Department (HOSPITAL_COMMUNITY)
Admission: EM | Admit: 2018-06-22 | Discharge: 2018-06-22 | Disposition: A | Discharge status: Home / Self Care | Payer: Medicare Other | Attending: Emergency Medicine | Admitting: Emergency Medicine

## 2018-06-22 ENCOUNTER — Encounter (HOSPITAL_COMMUNITY): Payer: Self-pay | Admitting: Emergency Medicine

## 2018-06-22 ENCOUNTER — Emergency Department (HOSPITAL_COMMUNITY): Payer: Medicare Other

## 2018-06-22 DIAGNOSIS — W06XXXA Fall from bed, initial encounter: Secondary | ICD-10-CM | POA: Diagnosis not present

## 2018-06-22 DIAGNOSIS — I120 Hypertensive chronic kidney disease with stage 5 chronic kidney disease or end stage renal disease: Secondary | ICD-10-CM | POA: Insufficient documentation

## 2018-06-22 DIAGNOSIS — S4991XA Unspecified injury of right shoulder and upper arm, initial encounter: Secondary | ICD-10-CM | POA: Diagnosis present

## 2018-06-22 DIAGNOSIS — Y939 Activity, unspecified: Secondary | ICD-10-CM | POA: Insufficient documentation

## 2018-06-22 DIAGNOSIS — E119 Type 2 diabetes mellitus without complications: Secondary | ICD-10-CM | POA: Insufficient documentation

## 2018-06-22 DIAGNOSIS — S46911A Strain of unspecified muscle, fascia and tendon at shoulder and upper arm level, right arm, initial encounter: Secondary | ICD-10-CM | POA: Diagnosis not present

## 2018-06-22 DIAGNOSIS — Z794 Long term (current) use of insulin: Secondary | ICD-10-CM | POA: Insufficient documentation

## 2018-06-22 DIAGNOSIS — N186 End stage renal disease: Secondary | ICD-10-CM | POA: Insufficient documentation

## 2018-06-22 DIAGNOSIS — J449 Chronic obstructive pulmonary disease, unspecified: Secondary | ICD-10-CM | POA: Insufficient documentation

## 2018-06-22 DIAGNOSIS — Y929 Unspecified place or not applicable: Secondary | ICD-10-CM | POA: Diagnosis not present

## 2018-06-22 DIAGNOSIS — Z79899 Other long term (current) drug therapy: Secondary | ICD-10-CM | POA: Insufficient documentation

## 2018-06-22 DIAGNOSIS — Y999 Unspecified external cause status: Secondary | ICD-10-CM | POA: Insufficient documentation

## 2018-06-22 NOTE — Discharge Instructions (Addendum)
Take over-the-counter medications as needed for pain,   follow-up with your orthopedic doctor if the symptoms are not improving, apply ice to help with the swelling

## 2018-06-22 NOTE — ED Notes (Signed)
Pt in xray, notified to bring to room D30

## 2018-06-22 NOTE — ED Triage Notes (Signed)
Pt reports rolling out of bed while she was sleeping, c/o left shoulder pain, pt states she took an aleve that helped some.

## 2018-06-22 NOTE — ED Provider Notes (Addendum)
[place] EMERGENCY DEPARTMENT Provider Note   CSN: [id number] Arrival date & time: 06/22/18  0737     History   Chief Complaint Chief Complaint  Patient presents with  . Fall  . Shoulder Pain    HPI [name] is a 57 y.o. female.  HPI Patient presents to the emergency room for evaluation of left shoulder pain.  Patient states she fell out of bed last evening while she was asleep.   Since then she has been having pain.  The pain increases with any type of movement or palpation.  Patient denies any other injuries.  No neck pain.  No chest pain.  No numbness or weakness.  No head injury or loss of consciousness. Past Medical History:  Diagnosis Date  . Chronic hypotension   . Complication of anesthesia    confusion  . COPD (chronic obstructive pulmonary disease) ([place])   . 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 ESRD (end stage renal disease) on dialysis [facility])    "TTS; [place]; (03/25/2014)  . GERD (gastroesophageal reflux disease)   . HTN (hypertension)   . Iron deficiency anemia   . Obesities, morbid ([place])   . OSA on CPAP   . PE (pulmonary embolism) ~ 2010  . Pneumonia    "now & once a long time ago" (03/26/2013)  . Shortness of breath    "just a little bit; at any time" (03/26/2013)  . Superior vena cava syndrome    Archie Endo 03/26/2013    Patient Active Problem List   Diagnosis Date Noted  . Bacteremia due to Staphylococcus   . Infection and inflammatory reaction due to cardiac device, implant, and graft ([place])   . Staphylococcus aureus bacteremia with sepsis ([place])   . Severe sepsis with septic shock ([place])   . Screen for STD (sexually transmitted disease)   . Pressure ulcer 08/14/2015  . Sepsis ([place]) 08/12/2015  . SIRS (systemic inflammatory response syndrome) ([place]) 08/12/2015  . Diabetes mellitus type 2 in obese ([place]) 08/12/2015  . OSA on CPAP 08/12/2015  . Chronic hypotension 08/12/2015  . COPD (chronic  obstructive pulmonary disease) ([place]) 08/12/2015  . Blood poisoning   . Hypercapnia 08/12/2014  . Acute pulmonary edema ([place]) 08/12/2014  . Acute respiratory failure with hypoxia ([place]) 08/10/2014  . COPD with acute exacerbation ([place]) 03/25/2014  . Fever and chills 03/26/2013  . Cough 03/26/2013  . Diarrhea 03/26/2013  . ESRD on hemodialysis ([place]) 03/26/2013  . OSA (obstructive sleep apnea) 03/26/2013  . Anemia of chronic disease 03/26/2013  . Secondary hyperparathyroidism ([place]) 03/26/2013  . Insulin-requiring or dependent type II diabetes mellitus ([place]) 03/26/2013    Past Surgical History:  Procedure Laterality Date  . ARTERIOVENOUS GRAFT PLACEMENT Left ~ 2011   "thigh" (4  . CARPAL TUNNEL RELEASE Right ~ 2011  . CARPAL TUNNEL RELEASE Right 01/28/2015   Procedure: RIGHT CARPAL TUNNEL RELEASE;  Surgeon: Roseanne Kaufman, MD;  Location: [place];  Service: Orthopedics;  Laterality: Right;  . REVISION OF ARTERIOVENOUS GORETEX GRAFT Left 08/14/2015   Procedure: REVISION OF ARTERIOVENOUS GORETEX GRAFT LEFT THIGH with  REMOVAL of segment of infected graft.;  Surgeon: Angelia Mould, MD;  Location: [place];  Service: Vascular;  Laterality: Left;  . THROMBECTOMY AND REVISION OF ARTERIOVENTOUS (AV) GORETEX  GRAFT Left ~ 12/2012   "thigh"   . VASCULAR SURGERY       OB History   None  Home Medications    Prior to Admission medications   Medication Sig Start Date End Date Taking? Authorizing Provider  albuterol (PROVENTIL HFA;VENTOLIN HFA) 108 (90 BASE) MCG/ACT inhaler Inhale 2 puffs into the lungs every 6 (six) hours as needed. For shortness of breath. Patient taking differently: Inhale 2 puffs into the lungs every 6 (six) hours as needed for wheezing or shortness of breath.  03/27/14   Viyuoh, Alison Stalling, MD  ATROVENT HFA 17 MCG/ACT inhaler Inhale 1 puff into the lungs every 6 (six) hours as needed. Wheezing 01/03/16   [provider]  B Complex-C (B-COMPLEX WITH VITAMIN C)  tablet Take 1 tablet by mouth daily.    [provider]  benzonatate (TESSALON) 100 MG capsule Take 2 capsules (200 mg total) by mouth 2 (two) times daily as needed for cough. 01/05/16   Nona Dell, PA-C  calcium acetate (PHOSLO) 667 MG capsule Take 1,334 mg by mouth 3 (three) times daily with meals.     [provider]  calcium elemental as carbonate (TUMS ULTRA 1000) 400 MG tablet Chew 1,000 mg by mouth daily.    [provider]  HYDROcodone-acetaminophen (NORCO/VICODIN) 5-325 MG tablet Take 1-2 tablets by mouth every 4 (four) hours as needed for severe pain. 09/10/16   Sam, Olivia Canter, PA-C  insulin aspart (NOVOLOG FLEXPEN) 100 UNIT/ML injection Inject 12-15 Units into the skin 3 (three) times daily before meals. Per sliding scale    [provider]  insulin glargine (LANTUS) 100 UNIT/ML injection Inject 0.08 mLs (8 Units total) into the skin at bedtime. 08/18/15   Mikhail, Velta Addison, DO  methocarbamol (ROBAXIN) 500 MG tablet Take 1 tablet (500 mg total) by mouth 2 (two) times daily. 09/10/16   Sam, Olivia Canter, PA-C  metoCLOPramide (REGLAN) 5 MG tablet Take 5 mg by mouth at bedtime.      [provider]  midodrine (PROAMATINE) 10 MG tablet Take 1 tablet (10 mg total) by mouth 3 (three) times daily with meals. 08/18/15   Mikhail, Velta Addison, DO  ondansetron (ZOFRAN ODT) 4 MG disintegrating tablet Take 1 tablet (4 mg total) by mouth every 8 (eight) hours as needed for nausea or vomiting. 01/05/16   Nona Dell, PA-C  oxyCODONE (OXY IR/ROXICODONE) 5 MG immediate release tablet Take 1 tablet (5 mg total) by mouth every 4 (four) hours as needed for severe pain. 01/28/15   Roseanne Kaufman, MD  oxymetazoline (AFRIN NASAL SPRAY) 0.05 % nasal spray Place 1 spray into both nostrils 2 (two) times daily. 01/05/16   Nona Dell, PA-C  predniSONE (DELTASONE) 10 MG tablet 5,4,3,2,1 taper 04/10/16   Caryl Ada K, PA-C  tiotropium (SPIRIVA) 18 MCG  inhalation capsule Place 18 mcg into inhaler and inhale daily.    [provider]  vancomycin (VANCOCIN) 1 GM/200ML SOLN Inject 200 mLs (1,000 mg total) into the vein Every Tuesday,Thursday,and Saturday with dialysis. 08/18/15   Cristal Ford, DO    Family History Family History  Problem Relation Age of Onset  . Diabetes Mellitus II Mother   . Hypertension Mother   . Hypertension Father   . Diabetes Mellitus II Father     Social History Social History   Tobacco Use  . Smoking status: Never Smoker  . Smokeless tobacco: Never Used  Substance Use Topics  . Alcohol use: No  . Drug use: No     Allergies   Amoxicillin; Iohexol; and Penicillins   Review of Systems Review of Systems   Physical  Exam Updated Vital Signs BP 98/67 (BP Location: Right Arm)   Pulse 94   Temp 98.1 F (36.7 C) (Oral)   Resp 16   SpO2 97%   Physical Exam  Constitutional: No distress.  HENT:  Head: Normocephalic and atraumatic.  Right Ear: External ear normal.  Left Ear: External ear normal.  Eyes: Conjunctivae are normal. Right eye exhibits no discharge. Left eye exhibits no discharge. No scleral icterus.  Neck: Neck supple. No tracheal deviation present.  Cardiovascular: Normal rate.  Pulmonary/Chest: Effort normal. No stridor. No respiratory distress.  Abdominal: She exhibits no distension.  Musculoskeletal: She exhibits no edema.       Right shoulder: She exhibits no tenderness, no bony tenderness and no swelling.       Left shoulder: She exhibits decreased range of motion ( Pain with range of motion) and tenderness. She exhibits no bony tenderness, no swelling and no deformity.       Right wrist: She exhibits no tenderness, no bony tenderness and no swelling.       Left wrist: She exhibits no tenderness, no bony tenderness and no swelling.       Right hip: She exhibits normal range of motion, no tenderness, no bony tenderness and no swelling.       Left hip: She exhibits normal  range of motion, no tenderness and no bony tenderness.       Right ankle: She exhibits no swelling. No tenderness.       Left ankle: She exhibits no swelling. No tenderness.       Cervical back: She exhibits no tenderness, no bony tenderness and no swelling.       Thoracic back: She exhibits no tenderness, no bony tenderness and no swelling.       Lumbar back: She exhibits no tenderness, no bony tenderness and no swelling.  Neurological: She is alert. Cranial nerve deficit: no gross deficits.  Skin: Skin is warm and dry. No rash noted. She is not diaphoretic.  Psychiatric: She has a normal mood and affect.  Nursing note and vitals reviewed.    ED Treatments / Results  Labs (all labs ordered are listed, but only abnormal results are displayed) Labs Reviewed - No data to display  EKG None  Radiology Dg Shoulder Left  Result Date: 06/22/2018 CLINICAL DATA:  57 year old female with history of trauma from a fall out of bed. EXAM: LEFT SHOULDER - 2+ VIEW COMPARISON:  Left shoulder radiograph 12/19/2015. FINDINGS: There is no evidence of fracture or dislocation. Degenerative changes are noted in the region of the greater tuberosity, likely secondary to underlying rotator cuff pathology. Humeral head is slightly high-riding. Soft tissues are unremarkable. IMPRESSION: 1. No evidence of significant acute traumatic injury to the left shoulder. 2. Chronic degenerative changes overlying the greater tuberosity, likely from underlying rotator cuff pathology. Electronically Signed   By: Vinnie Langton M.D.   On: 06/22/2018 08:28    Procedures Procedures (including critical care time)  Medications Ordered in ED Medications - No data to display   Initial Impression / Assessment and Plan / ED Course  I have reviewed the triage vital signs and the nursing notes.  Pertinent labs & imaging results that were available during my care of the patient were reviewed by me and considered in my medical  decision making (see chart for details).    X-rays are negative for fracture or dislocation.  Discussed supportive treatment at home.  Follow-up with her orthopedic doctor if symptoms  have not resolved over the next week or so.  Final Clinical Impressions(s) / ED Diagnoses   Final diagnoses:  Strain of right shoulder, initial encounter    ED Discharge Orders    None       Dorie Rank, MD 06/22/18 1025 I was notified the patient's blood pressure was low upon discharge.  Patient states at baseline her blood pressure is very low.  She is not feeling dizzy or lightheaded.  We will make sure the patient can stand without difficulty and is asymptomatic.   Dorie Rank, MD 06/22/18 1041

## 2018-06-22 NOTE — ED Notes (Signed)
Patient given discharge instructions and verbalized understanding.  Patient stable to discharge at this time. Discussed discharge with Dr. Tomi Bamberger, pt BP is normally low and pt states feeling normal, denies dizziness or lightheadedness. Patient is alert and oriented to baseline.  No distressed noted at this time.  All belongings taken with the patient at discharge.

## 2019-07-01 ENCOUNTER — Emergency Department (HOSPITAL_COMMUNITY): Payer: Medicare Other

## 2019-07-01 ENCOUNTER — Other Ambulatory Visit: Payer: Self-pay

## 2019-07-01 ENCOUNTER — Emergency Department (HOSPITAL_COMMUNITY)
Admission: EM | Admit: 2019-07-01 | Discharge: 2019-07-01 | Disposition: A | Discharge status: Home / Self Care | Payer: Medicare Other | Attending: Emergency Medicine | Admitting: Emergency Medicine

## 2019-07-01 DIAGNOSIS — K219 Gastro-esophageal reflux disease without esophagitis: Secondary | ICD-10-CM | POA: Insufficient documentation

## 2019-07-01 DIAGNOSIS — M25562 Pain in left knee: Secondary | ICD-10-CM | POA: Diagnosis not present

## 2019-07-01 DIAGNOSIS — Z79899 Other long term (current) drug therapy: Secondary | ICD-10-CM | POA: Diagnosis not present

## 2019-07-01 DIAGNOSIS — I10 Essential (primary) hypertension: Secondary | ICD-10-CM | POA: Diagnosis not present

## 2019-07-01 DIAGNOSIS — J449 Chronic obstructive pulmonary disease, unspecified: Secondary | ICD-10-CM | POA: Diagnosis not present

## 2019-07-01 NOTE — ED Triage Notes (Signed)
Pt here for L knee pain. Pt sts she first noticed the pain yesterday and today she is unable to bear weight on it. Denies injury or previous hx of problems with knee.

## 2019-07-01 NOTE — Discharge Instructions (Signed)
Your x-ray today was normal.  You may take Tylenol to help with your pain.  Please apply ice to the area, keep leg elevated.  If you experience any worsening symptoms please return to the emergency department.  I have attached the number to Dr. Mardelle Matte, orthopedist please follow-up with him.

## 2019-07-01 NOTE — ED Notes (Signed)
Pt's husband, Marlynn Perking would like to be updated at [phone]

## 2019-07-01 NOTE — ED Provider Notes (Signed)
[facility] EMERGENCY DEPARTMENT Provider Note   CSN: [id number] Arrival date & time: 07/01/19  1046    History   Chief Complaint Chief Complaint  Patient presents with  . Knee Pain    HPI [name] is a 58 y.o. female.     58 y.o female with a PMH of COPD, hypertension, GERD, pulmonary embolism presents to the ED with complaints of left knee pain which began this morning.  Patient reports getting out of bed and feeling a popping sensation along her left knee.  Pain is worse with palpation along the upper patella region she has not been able to ambulate, along with weight-bear.  Reports pain is exacerbated with ambulation.  Has taken some Tylenol for relieving symptoms without improvement.  DenieS any prior surgical history to her left knee, fevers, IV drug use.  The history is provided by the patient and medical records.  Knee Pain Associated symptoms: no fever     Past Medical History:  Diagnosis Date  . Chronic hypotension   . Complication of anesthesia    confusion  . COPD (chronic obstructive pulmonary disease) ([place])   . 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 ESRD (end stage renal disease) on dialysis [facility])    "TTS; [place]; (03/25/2014)  . GERD (gastroesophageal reflux disease)   . HTN (hypertension)   . Iron deficiency anemia   . Obesities, morbid ([place])   . OSA on CPAP   . PE (pulmonary embolism) ~ 2010  . Pneumonia    "now & once a long time ago" (03/26/2013)  . Shortness of breath    "just a little bit; at any time" (03/26/2013)  . Superior vena cava syndrome    Archie Endo 03/26/2013    Patient Active Problem List   Diagnosis Date Noted  . Bacteremia due to Staphylococcus   . Infection and inflammatory reaction due to cardiac device, implant, and graft ([place])   . Staphylococcus aureus bacteremia with sepsis ([place])   . Severe sepsis with septic shock ([place])   . Screen for STD (sexually transmitted disease)   .  Pressure ulcer 08/14/2015  . Sepsis ([place]) 08/12/2015  . SIRS (systemic inflammatory response syndrome) ([place]) 08/12/2015  . Diabetes mellitus type 2 in obese ([place]) 08/12/2015  . OSA on CPAP 08/12/2015  . Chronic hypotension 08/12/2015  . COPD (chronic obstructive pulmonary disease) ([place]) 08/12/2015  . Blood poisoning   . Hypercapnia 08/12/2014  . Acute pulmonary edema ([place]) 08/12/2014  . Acute respiratory failure with hypoxia ([place]) 08/10/2014  . COPD with acute exacerbation ([place]) 03/25/2014  . Fever and chills 03/26/2013  . Cough 03/26/2013  . Diarrhea 03/26/2013  . ESRD on hemodialysis ([place]) 03/26/2013  . OSA (obstructive sleep apnea) 03/26/2013  . Anemia of chronic disease 03/26/2013  . Secondary hyperparathyroidism ([place]) 03/26/2013  . Insulin-requiring or dependent type II diabetes mellitus ([place]) 03/26/2013    Past Surgical History:  Procedure Laterality Date  . ARTERIOVENOUS GRAFT PLACEMENT Left ~ 2011   "thigh" (4  . CARPAL TUNNEL RELEASE Right ~ 2011  . CARPAL TUNNEL RELEASE Right 01/28/2015   Procedure: RIGHT CARPAL TUNNEL RELEASE;  Surgeon: Roseanne Kaufman, MD;  Location: [place];  Service: Orthopedics;  Laterality: Right;  . REVISION OF ARTERIOVENOUS GORETEX GRAFT Left 08/14/2015   Procedure: REVISION OF ARTERIOVENOUS GORETEX GRAFT LEFT THIGH with  REMOVAL of segment of infected graft.;  Surgeon: Angelia Mould, MD;  Location: [place];  Service: Vascular;  Laterality: Left;  . THROMBECTOMY AND REVISION OF ARTERIOVENTOUS (AV) GORETEX  GRAFT Left ~ 12/2012   "thigh"   . VASCULAR SURGERY       OB History   No obstetric history on file.      Home Medications    Prior to Admission medications   Medication Sig Start Date End Date Taking? Authorizing Provider  albuterol (PROVENTIL HFA;VENTOLIN HFA) 108 (90 BASE) MCG/ACT inhaler Inhale 2 puffs into the lungs every 6 (six) hours as needed. For shortness of breath. Patient taking differently: Inhale 2 puffs into the lungs  every 6 (six) hours as needed for wheezing or shortness of breath.  03/27/14   Viyuoh, Alison Stalling, MD  ATROVENT HFA 17 MCG/ACT inhaler Inhale 1 puff into the lungs every 6 (six) hours as needed. Wheezing 01/03/16   [provider]  B Complex-C (B-COMPLEX WITH VITAMIN C) tablet Take 1 tablet by mouth daily.    [provider]  benzonatate (TESSALON) 100 MG capsule Take 2 capsules (200 mg total) by mouth 2 (two) times daily as needed for cough. 01/05/16   Nona Dell, PA-C  calcium acetate (PHOSLO) 667 MG capsule Take 1,334 mg by mouth 3 (three) times daily with meals.     [provider]  calcium elemental as carbonate (TUMS ULTRA 1000) 400 MG tablet Chew 1,000 mg by mouth daily.    [provider]  HYDROcodone-acetaminophen (NORCO/VICODIN) 5-325 MG tablet Take 1-2 tablets by mouth every 4 (four) hours as needed for severe pain. 09/10/16   Sam, Olivia Canter, PA-C  insulin aspart (NOVOLOG FLEXPEN) 100 UNIT/ML injection Inject 12-15 Units into the skin 3 (three) times daily before meals. Per sliding scale    [provider]  insulin glargine (LANTUS) 100 UNIT/ML injection Inject 0.08 mLs (8 Units total) into the skin at bedtime. 08/18/15   Mikhail, Velta Addison, DO  methocarbamol (ROBAXIN) 500 MG tablet Take 1 tablet (500 mg total) by mouth 2 (two) times daily. 09/10/16   Sam, Olivia Canter, PA-C  metoCLOPramide (REGLAN) 5 MG tablet Take 5 mg by mouth at bedtime.      [provider]  midodrine (PROAMATINE) 10 MG tablet Take 1 tablet (10 mg total) by mouth 3 (three) times daily with meals. 08/18/15   Mikhail, Velta Addison, DO  ondansetron (ZOFRAN ODT) 4 MG disintegrating tablet Take 1 tablet (4 mg total) by mouth every 8 (eight) hours as needed for nausea or vomiting. 01/05/16   Nona Dell, PA-C  oxyCODONE (OXY IR/ROXICODONE) 5 MG immediate release tablet Take 1 tablet (5 mg total) by mouth every 4 (four) hours as needed for severe pain. 01/28/15   Roseanne Kaufman, MD  oxymetazoline (AFRIN NASAL SPRAY) 0.05 % nasal spray Place 1 spray into both nostrils 2 (two) times daily. 01/05/16   Nona Dell, PA-C  predniSONE (DELTASONE) 10 MG tablet 5,4,3,2,1 taper 04/10/16   Caryl Ada K, PA-C  tiotropium (SPIRIVA) 18 MCG inhalation capsule Place 18 mcg into inhaler and inhale daily.    [provider]  vancomycin (VANCOCIN) 1 GM/200ML SOLN Inject 200 mLs (1,000 mg total) into the vein Every Tuesday,Thursday,and Saturday with dialysis. 08/18/15   Cristal Ford, DO    Family History Family History  Problem Relation Age of Onset  . Diabetes Mellitus II Mother   . Hypertension Mother   . Hypertension Father   . Diabetes Mellitus II Father     Social History Social History   Tobacco Use  .  Smoking status: Never Smoker  . Smokeless tobacco: Never Used  Substance Use Topics  . Alcohol use: No  . Drug use: No     Allergies   Amoxicillin, Iohexol, and Penicillins   Review of Systems Review of Systems  Constitutional: Negative for fever.  Musculoskeletal: Positive for arthralgias. Negative for joint swelling and myalgias.  Skin: Negative for color change, pallor and wound.     Physical Exam Updated Vital Signs BP (!) 106/54 (BP Location: Right Arm)   Pulse 75   Temp 97.9 F (36.6 C) (Oral)   Resp 18   SpO2 97%   Physical Exam Vitals signs and nursing note reviewed.  Constitutional:      General: She is not in acute distress.    Appearance: She is well-developed. She is obese.     Comments: Non-ill-appearing, playing slots on her cell phone.  HENT:     Head: Normocephalic and atraumatic.     Mouth/Throat:     Pharynx: No oropharyngeal exudate.  Eyes:     Pupils: Pupils are equal, round, and reactive to light.  Neck:     Musculoskeletal: Normal range of motion.  Cardiovascular:     Rate and Rhythm: Regular rhythm.     Heart sounds: Normal heart sounds.  Pulmonary:     Effort: Pulmonary effort is  normal. No respiratory distress.     Breath sounds: Normal breath sounds.  Abdominal:     General: Bowel sounds are normal. There is no distension.     Palpations: Abdomen is soft.     Tenderness: There is no abdominal tenderness.  Musculoskeletal:        General: No tenderness or deformity.     Left knee: She exhibits decreased range of motion. She exhibits no swelling, no effusion, no ecchymosis, no deformity, no laceration, no erythema, normal alignment and no LCL laxity.     Right lower leg: No edema.     Left lower leg: No edema.       Legs:     Comments: Pain with palpation above the left knee, worse with leg extension. Did ambulate upon arrival.   Skin:    General: Skin is warm and dry.  Neurological:     Mental Status: She is alert and oriented to person, place, and time.      ED Treatments / Results  Labs (all labs ordered are listed, but only abnormal results are displayed) Labs Reviewed - No data to display  EKG None  Radiology Dg Knee Complete 4 Views Left  Result Date: 07/01/2019 CLINICAL DATA:  Acute left knee pain without known injury. EXAM: LEFT KNEE - COMPLETE 4+ VIEW COMPARISON:  None. FINDINGS: No evidence of fracture, dislocation, or joint effusion. No evidence of arthropathy or other focal bone abnormality. Soft tissues are unremarkable. IMPRESSION: Negative. Electronically Signed   By: Marijo Conception M.D.   On: 07/01/2019 12:21    Procedures Procedures (including critical care time)  Medications Ordered in ED Medications - No data to display   Initial Impression / Assessment and Plan / ED Course  I have reviewed the triage vital signs and the nursing notes.  Pertinent labs & imaging results that were available during my care of the patient were reviewed by me and considered in my medical decision making (see chart for details).     Patient with a past medical history of kidney disease presents to the ED with complaints of left leg pain which  began this  morning after getting out of bed.  Reports she felt a popping sensation to her left knee.  Has pain along above the patella region.  No joint effusion, changes in the skin.  No swelling to the leg, no calf tenderness.  Low suspicion for any DVT.  Vital signs are within normal limits, denies any fevers or trauma.  Low suspicion for any septic joint.  Will obtain x-ray to further evaluate patient's complaint: X-ray of her left knee showed no dislocation, effusion, fracture present.  Will have patient apply rice therapy, she is only to take Tylenol to help with her symptoms.  She does have a history of kidney disease will not be providing her any anti-inflammatories.  We will also have her follow-up with Dr. Mardelle Matte as needed.  Crutches were offered to patient, however she reports she does not want these at this time as she has to go to dialysis this afternoon.  Return precautions discussed at length.   Portions of this note were generated with [employer]. Dictation errors may occur despite best attempts at proofreading.    Final Clinical Impressions(s) / ED Diagnoses   Final diagnoses:  Acute pain of left knee    ED Discharge Orders    None       Janeece Fitting, PA-C 07/01/19 1311    Tegeler, Gwenyth Allegra, MD 07/01/19 [phone]

## 2019-07-01 NOTE — ED Notes (Signed)
Patient verbalizes understanding of discharge instructions . Opportunity for questions and answers were provided . Armband removed by staff ,Pt discharged from ED. W/C  offered at D/C  and Declined W/C at D/C and was escorted to lobby by RN.  

## 2019-07-01 NOTE — ED Notes (Signed)
Pt transported to xray 

## 2019-09-08 DIAGNOSIS — H469 Unspecified optic neuritis: Secondary | ICD-10-CM | POA: Insufficient documentation

## 2019-09-08 DIAGNOSIS — H25811 Combined forms of age-related cataract, right eye: Secondary | ICD-10-CM | POA: Insufficient documentation

## 2019-09-08 DIAGNOSIS — H401114 Primary open-angle glaucoma, right eye, indeterminate stage: Secondary | ICD-10-CM | POA: Insufficient documentation

## 2019-09-08 DIAGNOSIS — H401124 Primary open-angle glaucoma, left eye, indeterminate stage: Secondary | ICD-10-CM | POA: Insufficient documentation

## 2019-09-08 DIAGNOSIS — H25812 Combined forms of age-related cataract, left eye: Secondary | ICD-10-CM | POA: Insufficient documentation

## 2019-11-03 IMAGING — DX DG SHOULDER 2+V*L*
3 series · 3 of 3 positions shown · non-contrast
Comparison: Left shoulder radiograph 12/19/2015.

CLINICAL DATA: 56-year-old female with history of trauma from a
fall out of bed.

EXAM:
LEFT SHOULDER - 2+ VIEW

[shoulder grashey]
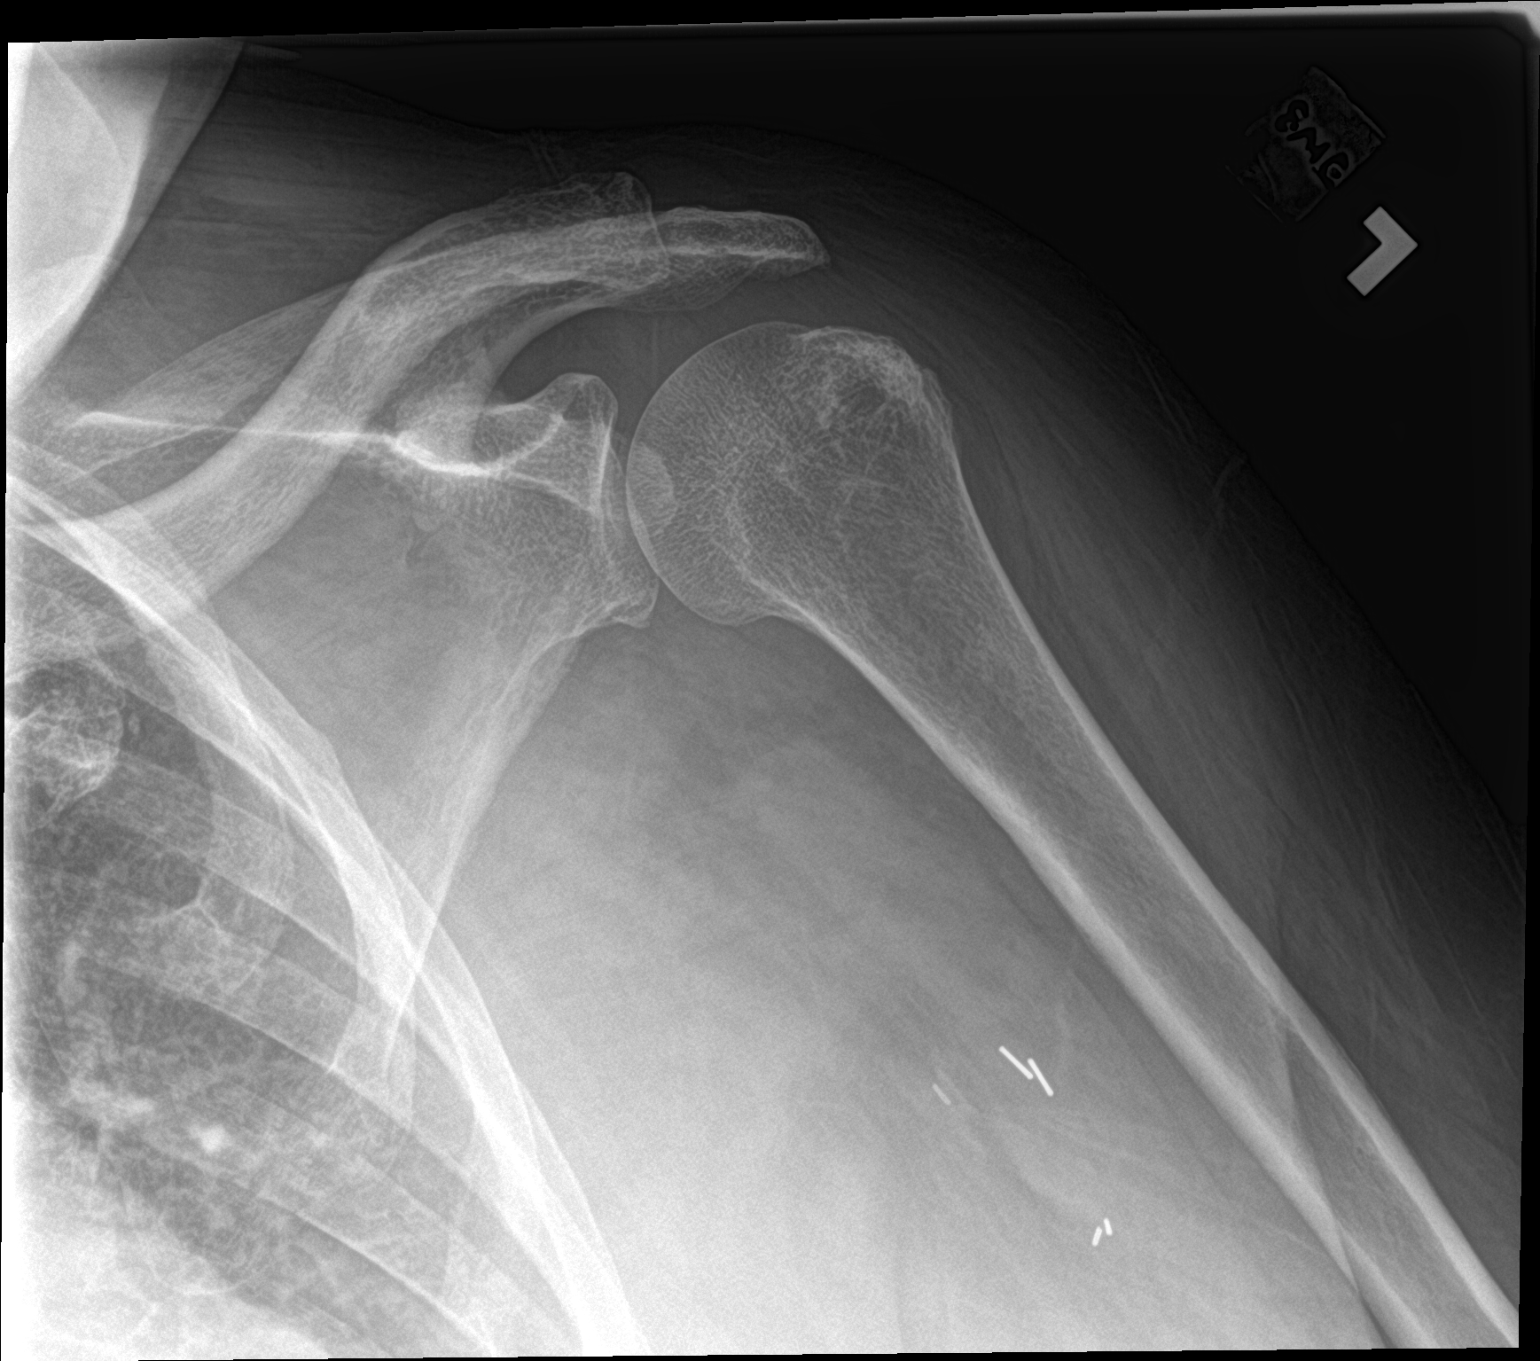

[shoulder y view]
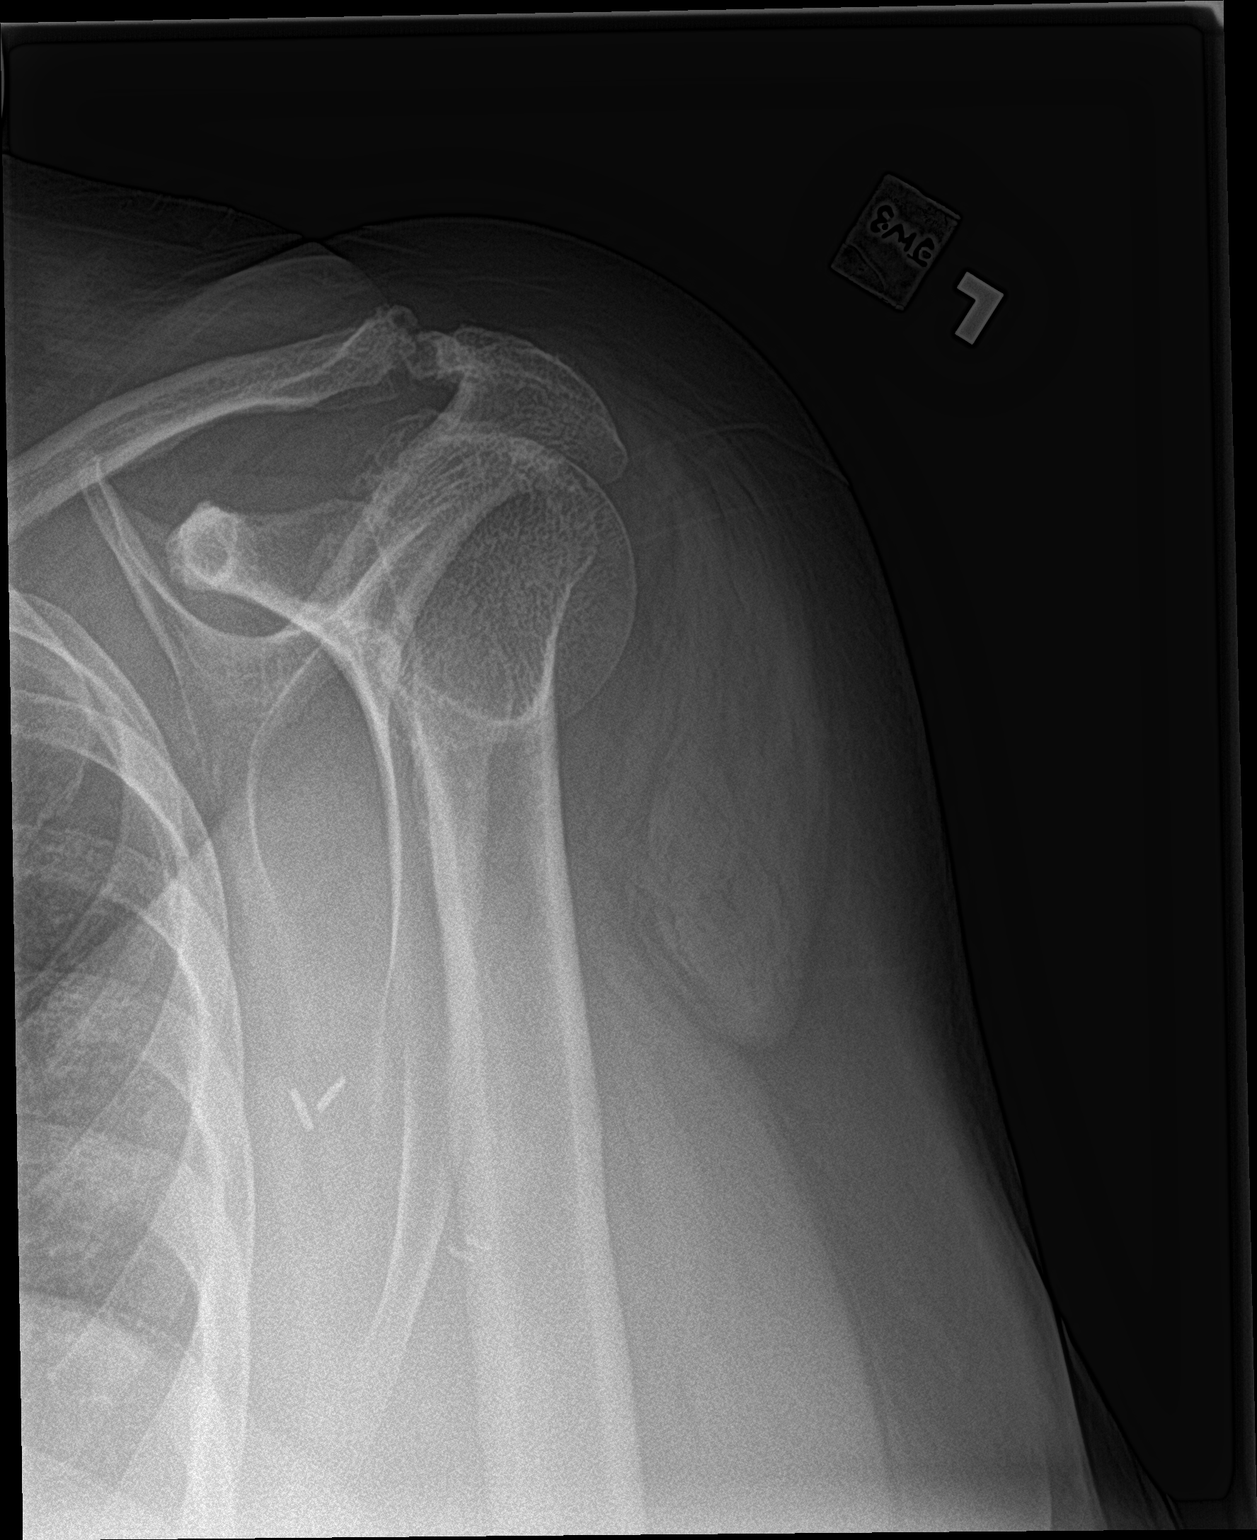

[shoulder axillary]
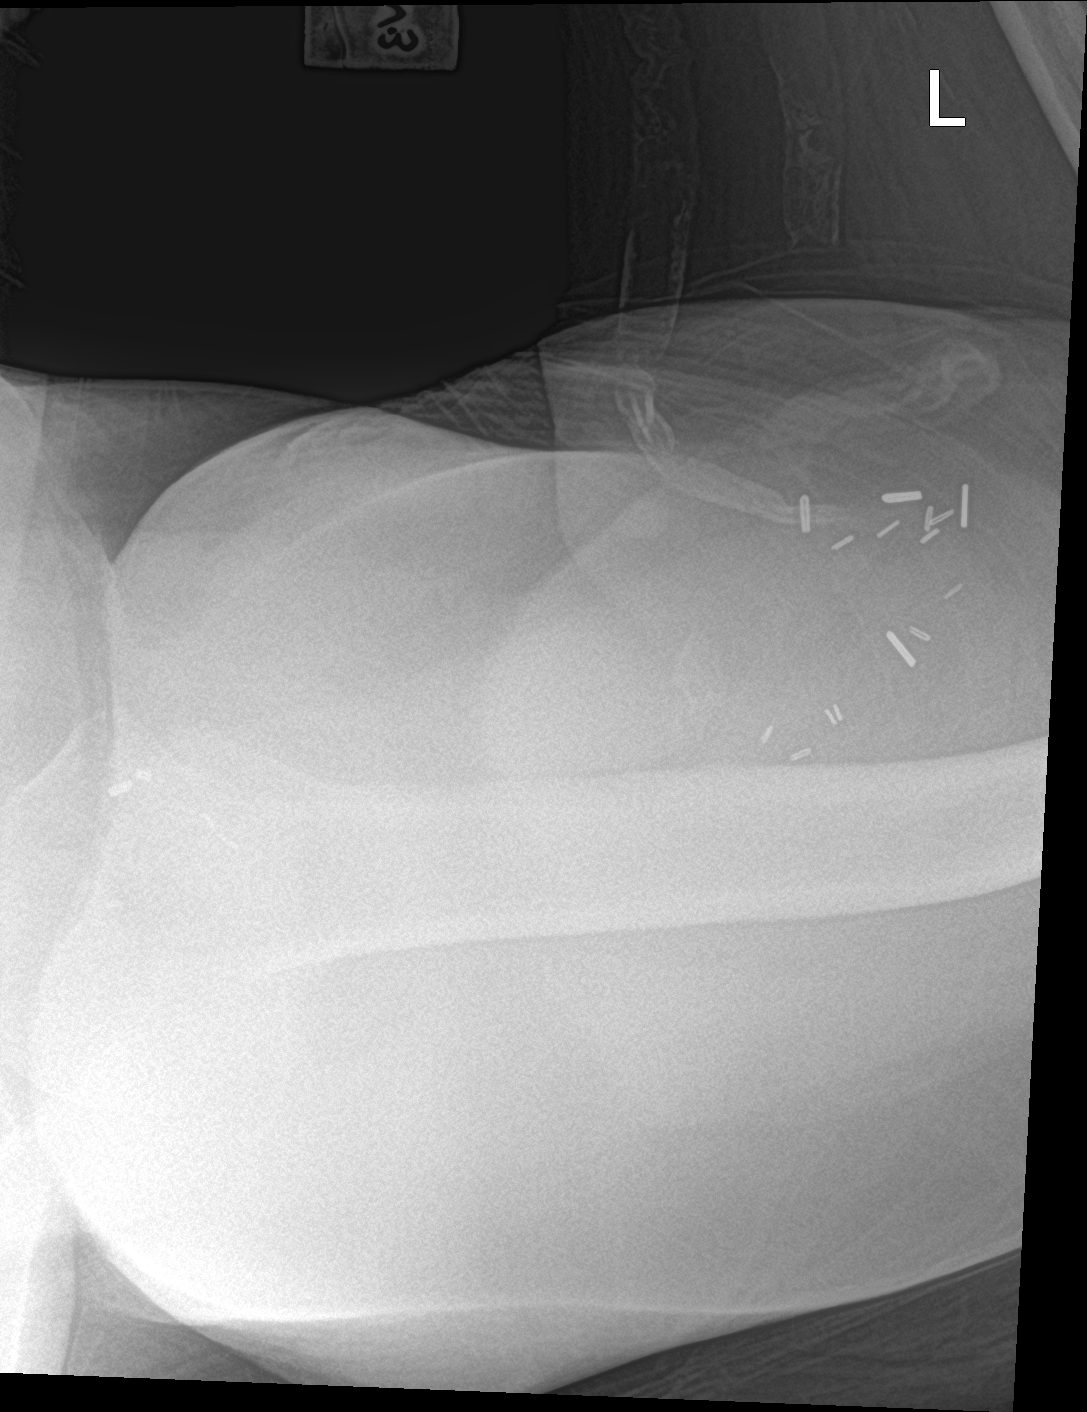

[3 of 3 positions shown; findings below may reference images not displayed]

FINDINGS: There is no evidence of fracture or dislocation. Degenerative
changes are noted in the region of the greater tuberosity, likely
secondary to underlying rotator cuff pathology. Humeral head is
slightly high-riding. Soft tissues are unremarkable.
IMPRESSION: 1. No evidence of significant acute traumatic injury to the left
shoulder.
2. Chronic degenerative changes overlying the greater tuberosity,
likely from underlying rotator cuff pathology.

## 2020-02-08 ENCOUNTER — Ambulatory Visit: Payer: Medicare Other | Attending: Internal Medicine

## 2020-02-08 DIAGNOSIS — Z23 Encounter for immunization: Secondary | ICD-10-CM | POA: Insufficient documentation

## 2020-02-08 NOTE — Progress Notes (Signed)
   Covid-19 Vaccination Clinic  Name:  [name]    MRN: [id number] DOB: Apr 25, 1961  02/08/2020  [name] was observed post Covid-19 immunization for 15 minutes without incidence. She was provided with Vaccine Information Sheet and instruction to access the V-Safe system.   [name] was instructed to call 911 with any severe reactions post vaccine: Marland Kitchen Difficulty breathing  . Swelling of your face and throat  . A fast heartbeat  . A bad rash all over your body  . Dizziness and weakness    Immunizations Administered    Name Date Dose VIS Date Route   Pfizer COVID-19 Vaccine 02/08/2020  9:06 AM 0.3 mL 11/20/2019 Intramuscular   Manufacturer: [place]   Lot: [id number]   [place]: [id number]

## 2020-03-02 ENCOUNTER — Ambulatory Visit: Payer: Medicare Other | Attending: Internal Medicine

## 2020-03-02 DIAGNOSIS — Z23 Encounter for immunization: Secondary | ICD-10-CM

## 2020-03-02 NOTE — Progress Notes (Signed)
   Covid-19 Vaccination Clinic  Name:  [name]    MRN: [id number] DOB: February 02, 1961  03/02/2020  [name] was observed post Covid-19 immunization for 15 minutes without incident. She was provided with Vaccine Information Sheet and instruction to access the V-Safe system.   [name] was instructed to call 911 with any severe reactions post vaccine: Marland Kitchen Difficulty breathing  . Swelling of face and throat  . A fast heartbeat  . A bad rash all over body  . Dizziness and weakness   Immunizations Administered    Name Date Dose VIS Date Route   Pfizer COVID-19 Vaccine 03/02/2020  9:01 AM 0.3 mL 11/20/2019 Intramuscular   Manufacturer: [place]   Lot: [id number]   [place]: [id number]

## 2020-08-03 ENCOUNTER — Ambulatory Visit (HOSPITAL_COMMUNITY)
Admission: EM | Admit: 2020-08-03 | Discharge: 2020-08-03 | Discharge status: Against Medical Advice | Payer: Medicare Other

## 2020-08-03 ENCOUNTER — Other Ambulatory Visit: Payer: Self-pay

## 2020-11-11 IMAGING — DX LEFT KNEE - COMPLETE 4+ VIEW
4 series · 4 of 4 positions shown · non-contrast
Comparison: None.

CLINICAL DATA: Acute left knee pain without known injury.

EXAM:
LEFT KNEE - COMPLETE 4+ VIEW

[knee ap]
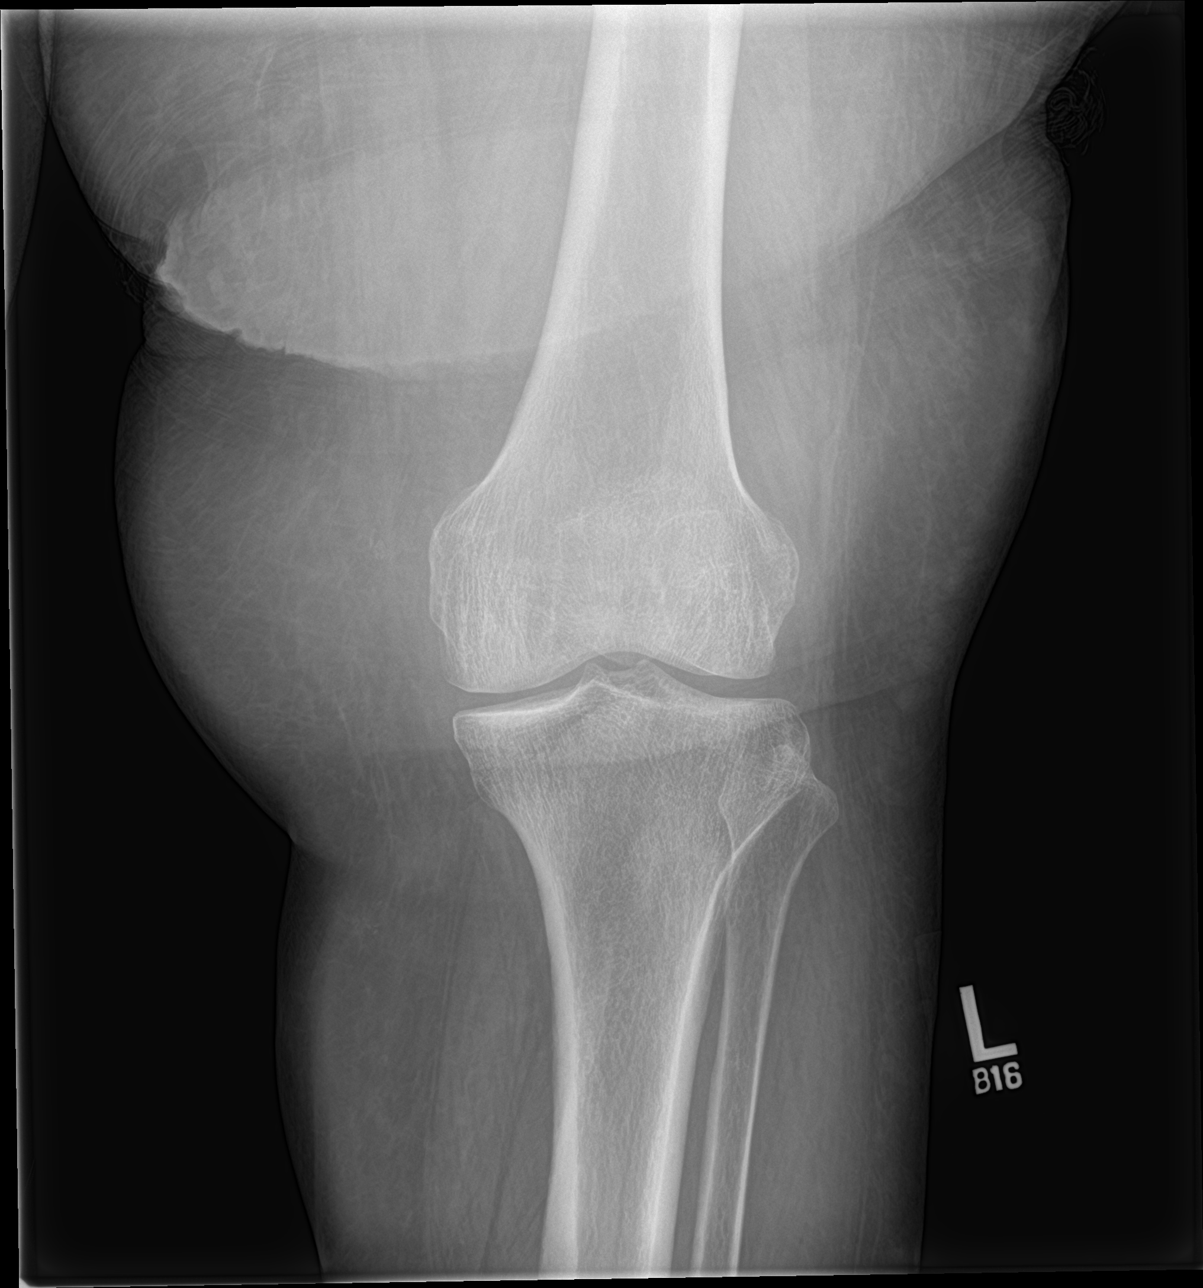

[knee obl (1 of 2)]
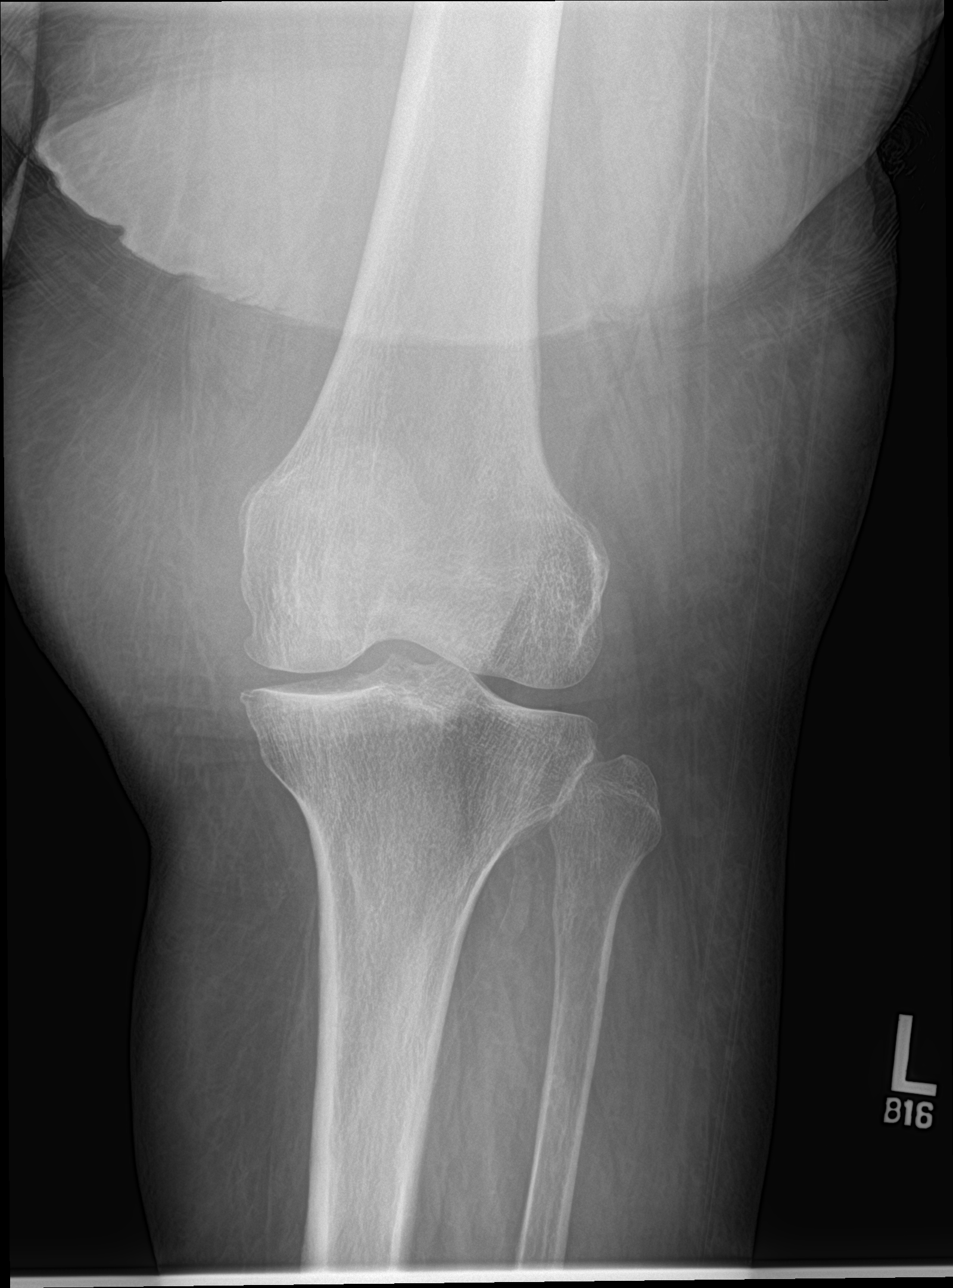

[knee obl (2 of 2)]
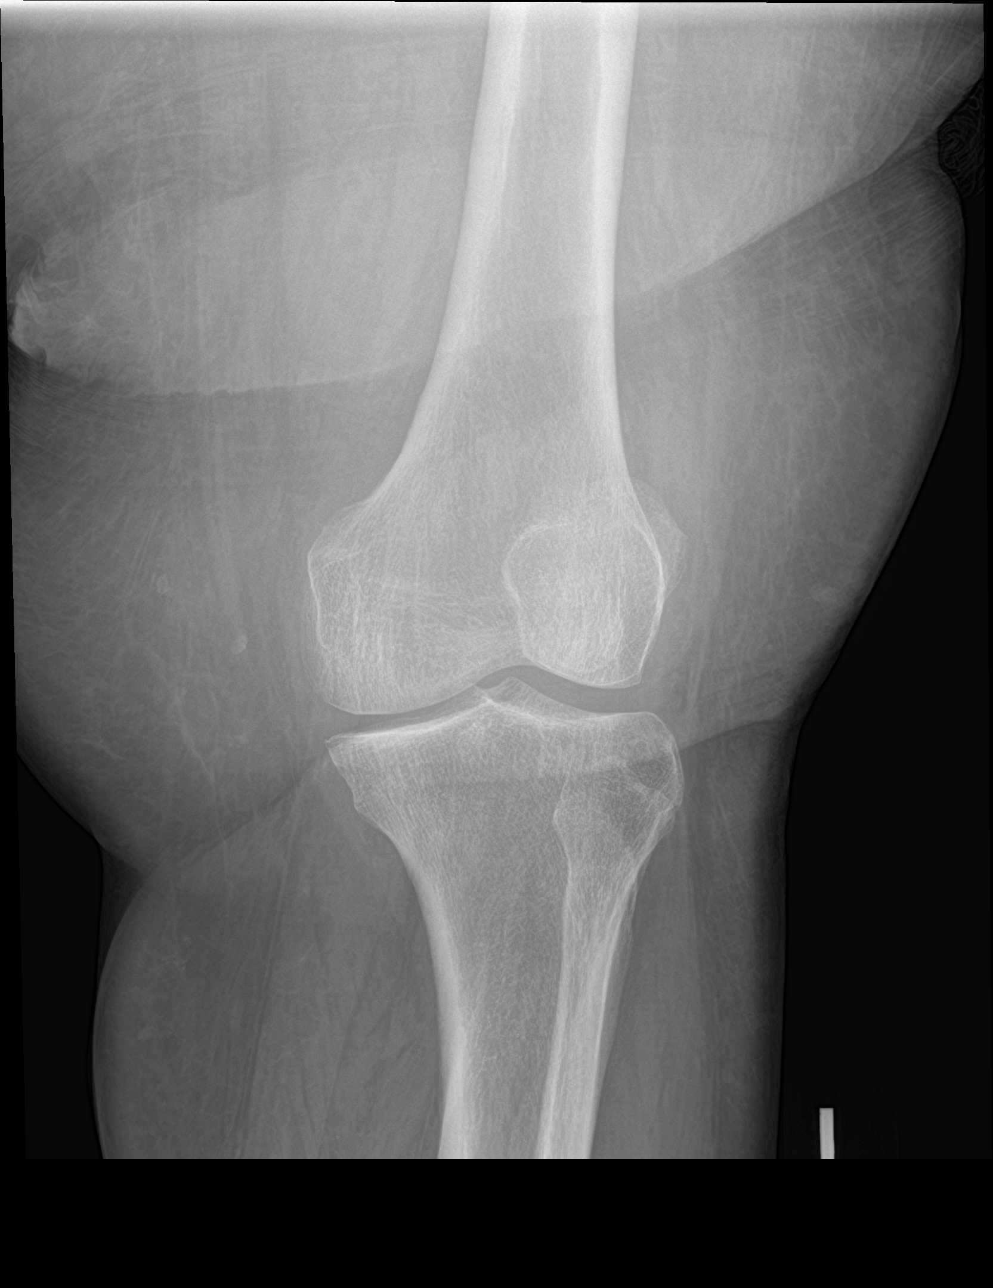

[knee lat]
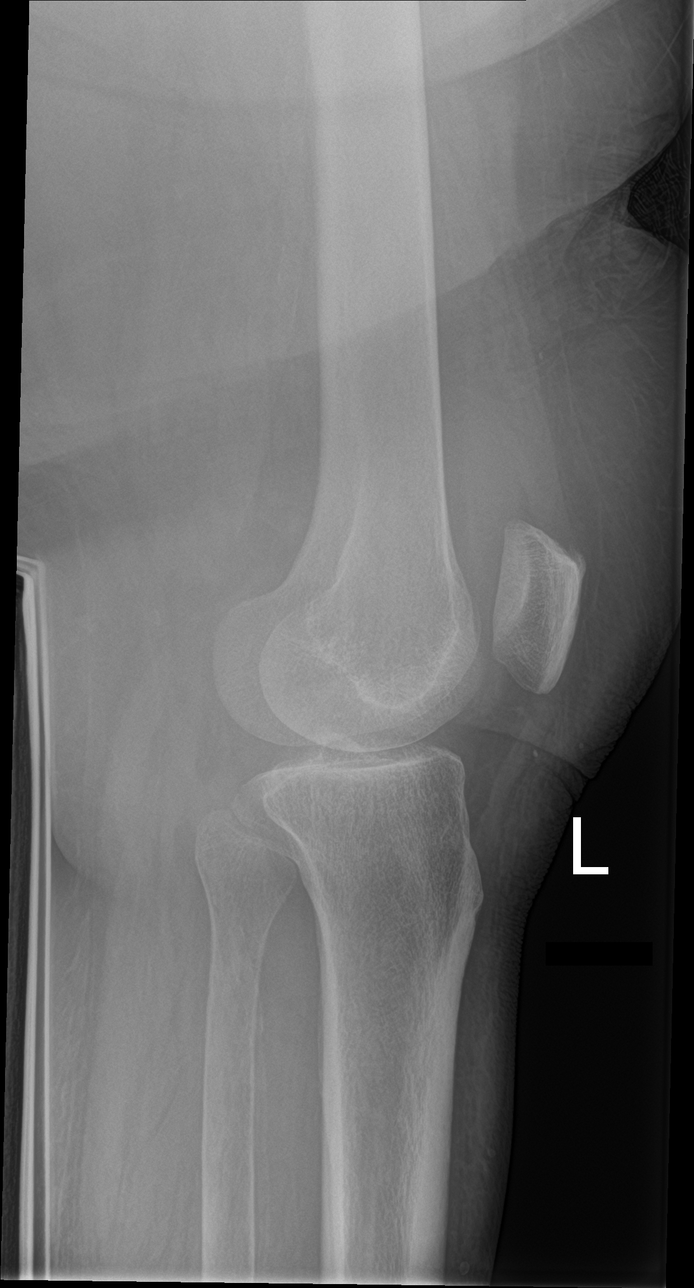

[4 of 4 positions shown; findings below may reference images not displayed]

FINDINGS: No evidence of fracture, dislocation, or joint effusion. No evidence
of arthropathy or other focal bone abnormality. Soft tissues are
unremarkable.
IMPRESSION: Negative.

## 2021-01-19 ENCOUNTER — Emergency Department (HOSPITAL_COMMUNITY)
Admission: EM | Admit: 2021-01-19 | Discharge: 2021-01-19 | Disposition: A | Discharge status: Home / Self Care | Payer: Medicare Other | Attending: Emergency Medicine | Admitting: Emergency Medicine

## 2021-01-19 ENCOUNTER — Other Ambulatory Visit: Payer: Self-pay

## 2021-01-19 DIAGNOSIS — N186 End stage renal disease: Secondary | ICD-10-CM | POA: Insufficient documentation

## 2021-01-19 DIAGNOSIS — Z79899 Other long term (current) drug therapy: Secondary | ICD-10-CM | POA: Diagnosis not present

## 2021-01-19 DIAGNOSIS — Z794 Long term (current) use of insulin: Secondary | ICD-10-CM | POA: Diagnosis not present

## 2021-01-19 DIAGNOSIS — I120 Hypertensive chronic kidney disease with stage 5 chronic kidney disease or end stage renal disease: Secondary | ICD-10-CM | POA: Insufficient documentation

## 2021-01-19 DIAGNOSIS — Y840 Cardiac catheterization as the cause of abnormal reaction of the patient, or of later complication, without mention of misadventure at the time of the procedure: Secondary | ICD-10-CM | POA: Insufficient documentation

## 2021-01-19 DIAGNOSIS — T82838A Hemorrhage of vascular prosthetic devices, implants and grafts, initial encounter: Secondary | ICD-10-CM | POA: Diagnosis present

## 2021-01-19 DIAGNOSIS — J449 Chronic obstructive pulmonary disease, unspecified: Secondary | ICD-10-CM | POA: Diagnosis not present

## 2021-01-19 DIAGNOSIS — Z992 Dependence on renal dialysis: Secondary | ICD-10-CM | POA: Insufficient documentation

## 2021-01-19 DIAGNOSIS — E1122 Type 2 diabetes mellitus with diabetic chronic kidney disease: Secondary | ICD-10-CM | POA: Insufficient documentation

## 2021-01-19 DIAGNOSIS — R58 Hemorrhage, not elsewhere classified: Secondary | ICD-10-CM

## 2021-01-19 LAB — COMPREHENSIVE METABOLIC PANEL
ALT: 13 U/L (ref 0–44)
AST: 19 U/L (ref 15–41)
Albumin: 3.7 g/dL (ref 3.5–5.0)
Alkaline Phosphatase: 172 U/L — ABNORMAL HIGH (ref 38–126)
Anion gap: 12 (ref 5–15)
BUN: 9 mg/dL (ref 6–20)
CO2: 29 mmol/L (ref 22–32)
Calcium: 7.8 mg/dL — ABNORMAL LOW (ref 8.9–10.3)
Chloride: 97 mmol/L — ABNORMAL LOW (ref 98–111)
Creatinine, Ser: 3.04 mg/dL — ABNORMAL HIGH (ref 0.44–1.00)
GFR, Estimated: 17 mL/min — ABNORMAL LOW (ref >60)
Glucose, Bld: 88 mg/dL (ref 70–99)
Potassium: 3.7 mmol/L (ref 3.5–5.1)
Sodium: 138 mmol/L (ref 135–145)
Total Bilirubin: 0.9 mg/dL (ref 0.3–1.2)
Total Protein: 8.2 g/dL — ABNORMAL HIGH (ref 6.5–8.1)

## 2021-01-19 LAB — CBC
HCT: 31.5 % — ABNORMAL LOW (ref 36.0–46.0)
Hemoglobin: 10 g/dL — ABNORMAL LOW (ref 12.0–15.0)
MCH: 29.9 pg (ref 26.0–34.0)
MCHC: 31.7 g/dL (ref 30.0–36.0)
MCV: 94 fL (ref 80.0–100.0)
Platelets: 175 10*3/uL (ref 150–400)
RBC: 3.35 MIL/uL — ABNORMAL LOW (ref 3.87–5.11)
RDW: 15.7 % — ABNORMAL HIGH (ref 11.5–15.5)
WBC: 5.4 10*3/uL (ref 4.0–10.5)
nRBC: 0 % (ref 0.0–0.2)

## 2021-01-19 NOTE — ED Triage Notes (Addendum)
Patient sent to ED from dialysis center for evaluation. Patient states during dialysis treatment the needle fell out and the nurse at the facility was concerned about the volume of blood loss. Patient was reaccessed at dialysis and treatment was completed, is not currently accessed. Patient denies any complaints at this time.

## 2021-01-19 NOTE — ED Provider Notes (Signed)
[facility] EMERGENCY DEPARTMENT Provider Note   CSN: [id number] Arrival date & time: 01/19/21  1108     History Chief Complaint  Patient presents with  . Vascular Access Problem    [name] is a 60 y.o. female with an extensive past medical history as noted below who presents to the ED from dialysis due to concern of blood loss.  Patient states she was at dialysis just prior to arrival when patient's AV graft in her LLE opened up and patient began to bleed. Dialysis staff were concerned due to amount of blood loss. Patient was able to finish her entire dialysis session with no access difficulties following the incident.  Patient denies lightheadedness, dizziness, chest pain, and shortness of breath.  She has no complaints at this time.  No treatment prior to arrival.  No aggravating or alleviating symptoms.  History obtained from patient and past medical records. No interpreter used during encounter.      Past Medical History:  Diagnosis Date  . Chronic hypotension   . Complication of anesthesia    confusion  . COPD (chronic obstructive pulmonary disease) ([place])   . 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 ESRD (end stage renal disease) on dialysis [facility])    "TTS; [place]; (03/25/2014)  . GERD (gastroesophageal reflux disease)   . HTN (hypertension)   . Iron deficiency anemia   . Obesities, morbid ([place])   . OSA on CPAP   . PE (pulmonary embolism) ~ 2010  . Pneumonia    "now & once a long time ago" (03/26/2013)  . Shortness of breath    "just a little bit; at any time" (03/26/2013)  . Superior vena cava syndrome    Archie Endo 03/26/2013    Patient Active Problem List   Diagnosis Date Noted  . Bacteremia due to Staphylococcus   . Infection and inflammatory reaction due to cardiac device, implant, and graft ([place])   . Staphylococcus aureus bacteremia with sepsis ([place])   . Severe sepsis with septic shock ([place])   . Screen for STD  (sexually transmitted disease)   . Pressure ulcer 08/14/2015  . Sepsis ([place]) 08/12/2015  . SIRS (systemic inflammatory response syndrome) ([place]) 08/12/2015  . Diabetes mellitus type 2 in obese ([place]) 08/12/2015  . OSA on CPAP 08/12/2015  . Chronic hypotension 08/12/2015  . COPD (chronic obstructive pulmonary disease) ([place]) 08/12/2015  . Blood poisoning   . Hypercapnia 08/12/2014  . Acute pulmonary edema ([place]) 08/12/2014  . Acute respiratory failure with hypoxia ([place]) 08/10/2014  . COPD with acute exacerbation ([place]) 03/25/2014  . Fever and chills 03/26/2013  . Cough 03/26/2013  . Diarrhea 03/26/2013  . ESRD on hemodialysis ([place]) 03/26/2013  . OSA (obstructive sleep apnea) 03/26/2013  . Anemia of chronic disease 03/26/2013  . Secondary hyperparathyroidism ([place]) 03/26/2013  . Insulin-requiring or dependent type II diabetes mellitus ([place]) 03/26/2013    Past Surgical History:  Procedure Laterality Date  . ARTERIOVENOUS GRAFT PLACEMENT Left ~ 2011   "thigh" (4  . CARPAL TUNNEL RELEASE Right ~ 2011  . CARPAL TUNNEL RELEASE Right 01/28/2015   Procedure: RIGHT CARPAL TUNNEL RELEASE;  Surgeon: Roseanne Kaufman, MD;  Location: [facility];  Service: Orthopedics;  Laterality: Right;  . REVISION OF ARTERIOVENOUS GORETEX GRAFT Left 08/14/2015   Procedure: REVISION OF ARTERIOVENOUS GORETEX GRAFT LEFT THIGH with  REMOVAL of segment of infected graft.;  Surgeon: Angelia Mould, MD;  Location: [facility];  Service: Vascular;  Laterality: Left;  . THROMBECTOMY AND REVISION OF ARTERIOVENTOUS (AV) GORETEX  GRAFT Left ~ 12/2012   "thigh"   . VASCULAR SURGERY       OB History   No obstetric history on file.     Family History  Problem Relation Age of Onset  . Diabetes Mellitus II Mother   . Hypertension Mother   . Hypertension Father   . Diabetes Mellitus II Father     Social History   Tobacco Use  . Smoking status: Never Smoker  . Smokeless tobacco: Never Used  Substance Use Topics  . Alcohol  use: No  . Drug use: No    Home Medications Prior to Admission medications   Medication Sig Start Date End Date Taking? Authorizing Provider  albuterol (PROVENTIL HFA;VENTOLIN HFA) 108 (90 BASE) MCG/ACT inhaler Inhale 2 puffs into the lungs every 6 (six) hours as needed. For shortness of breath. Patient taking differently: Inhale 2 puffs into the lungs every 6 (six) hours as needed for wheezing or shortness of breath.  03/27/14   Viyuoh, Alison Stalling, MD  ATROVENT HFA 17 MCG/ACT inhaler Inhale 1 puff into the lungs every 6 (six) hours as needed. Wheezing 01/03/16   [provider]  B Complex-C (B-COMPLEX WITH VITAMIN C) tablet Take 1 tablet by mouth daily.    [provider]  benzonatate (TESSALON) 100 MG capsule Take 2 capsules (200 mg total) by mouth 2 (two) times daily as needed for cough. 01/05/16   Nona Dell, PA-C  calcium acetate (PHOSLO) 667 MG capsule Take 1,334 mg by mouth 3 (three) times daily with meals.     [provider]  calcium elemental as carbonate (TUMS ULTRA 1000) 400 MG tablet Chew 1,000 mg by mouth daily.    [provider]  HYDROcodone-acetaminophen (NORCO/VICODIN) 5-325 MG tablet Take 1-2 tablets by mouth every 4 (four) hours as needed for severe pain. 09/10/16   Sam, Olivia Canter, PA-C  insulin aspart (NOVOLOG FLEXPEN) 100 UNIT/ML injection Inject 12-15 Units into the skin 3 (three) times daily before meals. Per sliding scale    [provider]  insulin glargine (LANTUS) 100 UNIT/ML injection Inject 0.08 mLs (8 Units total) into the skin at bedtime. 08/18/15   Mikhail, Velta Addison, DO  methocarbamol (ROBAXIN) 500 MG tablet Take 1 tablet (500 mg total) by mouth 2 (two) times daily. 09/10/16   Sam, Olivia Canter, PA-C  metoCLOPramide (REGLAN) 5 MG tablet Take 5 mg by mouth at bedtime.      [provider]  midodrine (PROAMATINE) 10 MG tablet Take 1 tablet (10 mg total) by mouth 3 (three) times daily with meals. 08/18/15   Mikhail,  Velta Addison, DO  ondansetron (ZOFRAN ODT) 4 MG disintegrating tablet Take 1 tablet (4 mg total) by mouth every 8 (eight) hours as needed for nausea or vomiting. 01/05/16   Nona Dell, PA-C  oxyCODONE (OXY IR/ROXICODONE) 5 MG immediate release tablet Take 1 tablet (5 mg total) by mouth every 4 (four) hours as needed for severe pain. 01/28/15   Roseanne Kaufman, MD  oxymetazoline (AFRIN NASAL SPRAY) 0.05 % nasal spray Place 1 spray into both nostrils 2 (two) times daily. 01/05/16   Nona Dell, PA-C  predniSONE (DELTASONE) 10 MG tablet 5,4,3,2,1 taper 04/10/16   Caryl Ada K, PA-C  tiotropium (SPIRIVA) 18 MCG inhalation capsule Place 18 mcg into inhaler and inhale daily.    [provider]  vancomycin (VANCOCIN) 1 GM/200ML SOLN Inject 200 mLs (  1,000 mg total) into the vein Every Tuesday,Thursday,and Saturday with dialysis. 08/18/15   Cristal Ford, DO    Allergies    Amoxicillin, Iohexol, and Penicillins  Review of Systems   Review of Systems  Constitutional: Negative for chills and fever.  Respiratory: Negative for shortness of breath.   Cardiovascular: Negative for chest pain.  Neurological: Negative for dizziness and light-headedness.  All other systems reviewed and are negative.   Physical Exam Updated Vital Signs BP 102/68 (BP Location: Right Arm)   Pulse 78   Temp 98.3 F (36.8 C)   Resp 20   SpO2 96%   Physical Exam Vitals and nursing note reviewed.  Constitutional:      General: She is not in acute distress.    Appearance: She is not ill-appearing.  HENT:     Head: Normocephalic.  Eyes:     Pupils: Pupils are equal, round, and reactive to light.  Cardiovascular:     Rate and Rhythm: Normal rate and regular rhythm.     Pulses: Normal pulses.     Heart sounds: Normal heart sounds. No murmur heard. No friction rub. No gallop.   Pulmonary:     Effort: Pulmonary effort is normal.     Breath sounds: Normal breath sounds.  Abdominal:      General: Abdomen is flat. Bowel sounds are normal. There is no distension.     Palpations: Abdomen is soft.     Tenderness: There is no abdominal tenderness. There is no guarding or rebound.     Comments: Abdomen soft, nondistended, nontender to palpation in all quadrants without guarding or peritoneal signs. No rebound.   Musculoskeletal:     Cervical back: Neck supple.     Comments: LLE neurovascularly intact  Skin:    Comments: AV graft on lateral aspect of LLE. No active bleeding.   Neurological:     General: No focal deficit present.     Mental Status: She is alert.  Psychiatric:        Mood and Affect: Mood normal.        Behavior: Behavior normal.     ED Results / Procedures / Treatments   Labs (all labs ordered are listed, but only abnormal results are displayed) Labs Reviewed  COMPREHENSIVE METABOLIC PANEL - Abnormal; Notable for the following components:      Result Value   Chloride 97 (*)    Creatinine, Ser 3.04 (*)    Calcium 7.8 (*)    Total Protein 8.2 (*)    Alkaline Phosphatase 172 (*)    GFR, Estimated 17 (*)    All other components within normal limits  CBC - Abnormal; Notable for the following components:   RBC 3.35 (*)    Hemoglobin 10.0 (*)    HCT 31.5 (*)    RDW 15.7 (*)    All other components within normal limits    EKG None  Radiology No results found.  Procedures Procedures   Medications Ordered in ED Medications - No data to display  ED Course  I have reviewed the triage vital signs and the nursing notes.  Pertinent labs & imaging results that were available during my care of the patient were reviewed by me and considered in my medical decision making (see chart for details).    MDM Rules/Calculators/A&P                         60 year old female presents to the ED  from dialysis due to concerns about blood loss. Patient was receiving dialysis just prior to arrival when she began to lose blood from her graft in LLE. Following the  bleeding episode, patient was able to finish dialysis.  Patient has no complaints.  Denies lightheadedness, dizziness, shortness of breath, and chest pain.  Upon arrival, vitals all within normal limits.  Patient is afebrile, not tachycardic or hypoxic.  Patient in no acute distress and non-ill-appearing. Graft in LLE with no active bleeding. LLE neurovascularly intact.  Routine labs ordered at triage.  CBC reassuring with no leukocytosis.  Hemoglobin at 10 with no baseline hemoglobin over the past 4 years.  Suspect patient typically has anemia likely due to ESRD.  CMP significant for creatinine at 3.04 likely due to ESRD. Elevated alk phos at 172. No RUQ abdominal pain. Normal AST/ALT. Advised patient to have Alk phos rechecked by PCP to ensure downtrending. Patient hemodynamically stable here in the ED.  Patient able to ambulate without difficulty.  Patient stable for discharge. Strict ED precautions discussed with patient. Patient states understanding and agrees to plan. Patient discharged home in no acute distress and stable vitals.  Final Clinical Impression(s) / ED Diagnoses Final diagnoses:  Bleeding    Rx / DC Orders ED Discharge Orders    None       Karie Kirks 01/19/21 1304    Wyvonnia Dusky, MD 01/19/21 [phone]

## 2021-01-19 NOTE — ED Notes (Signed)
Patient discharge instructions reviewed with the patient. The patient verbalized understanding of instructions. Patient discharged. 

## 2021-01-19 NOTE — Discharge Instructions (Addendum)
As discussed, your vitals are all within normal limits.  Your hemoglobin was slightly lower than normal likely due to your end-stage renal disease.  Please follow-up with your PCP to recheck your alkaline phosphatase since it was slightly elevated here in the ED. Return to the ER for new or worsening symptoms.

## 2021-02-23 DIAGNOSIS — S71102A Unspecified open wound, left thigh, initial encounter: Secondary | ICD-10-CM | POA: Insufficient documentation

## 2021-03-01 ENCOUNTER — Other Ambulatory Visit: Payer: Self-pay

## 2021-03-01 ENCOUNTER — Ambulatory Visit (INDEPENDENT_AMBULATORY_CARE_PROVIDER_SITE_OTHER): Payer: Medicare Other | Admitting: Vascular Surgery

## 2021-03-01 ENCOUNTER — Encounter: Payer: Self-pay | Admitting: Vascular Surgery

## 2021-03-01 VITALS — BP 112/72 | HR 73 | Temp 98.1°F | Resp 20 | Ht 61.0 in | Wt 249.0 lb

## 2021-03-01 DIAGNOSIS — Z992 Dependence on renal dialysis: Secondary | ICD-10-CM | POA: Diagnosis not present

## 2021-03-01 DIAGNOSIS — N186 End stage renal disease: Secondary | ICD-10-CM

## 2021-03-01 NOTE — Progress Notes (Signed)
Patient name: [name] MRN: [id number] DOB: Jul 13, 1961 Sex: female  REASON FOR VISIT:   Wound overlying left thigh AV graft.  HPI:   [name] is a pleasant 60 y.o. female who had a left thigh AV graft placed in 1997.  Of note she is also had a previous right thigh AV graft. Her most recent surgical revision that I can tell was in September 2016 when she presented with fevers and a positive blood culture.  She had an area over the lateral aspect of her graft that was draining and I replaced the lateral half of the graft with a 7 mm interposition PTFE graft.  She dialyzes on Tuesdays Thursdays and Saturdays.  About a week ago she began to notice a small blister over the lateral aspect of her left thigh AV graft.  She was seen at the dialysis center and added on today.  She denies any specific complaints.  She tells me that her graft has been working well.  She denies fever or chills.  She is not on any blood thinners.  Current Outpatient Medications  Medication Sig Dispense Refill  . albuterol (PROVENTIL HFA;VENTOLIN HFA) 108 (90 BASE) MCG/ACT inhaler Inhale 2 puffs into the lungs every 6 (six) hours as needed. For shortness of breath. (Patient taking differently: Inhale 2 puffs into the lungs every 6 (six) hours as needed for wheezing or shortness of breath.) 1 Inhaler 0  . ATROVENT HFA 17 MCG/ACT inhaler Inhale 1 puff into the lungs every 6 (six) hours as needed. Wheezing  0  . B Complex-C (B-COMPLEX WITH VITAMIN C) tablet Take 1 tablet by mouth daily.    . benzonatate (TESSALON) 100 MG capsule Take 2 capsules (200 mg total) by mouth 2 (two) times daily as needed for cough. 20 capsule 0  . calcium acetate (PHOSLO) 667 MG capsule Take 1,334 mg by mouth 3 (three) times daily with meals.    . calcium elemental as carbonate (BARIATRIC TUMS ULTRA) 400 MG chewable tablet Chew 1,000 mg by mouth daily.    Marland Kitchen HYDROcodone-acetaminophen (NORCO/VICODIN) 5-325 MG tablet Take 1-2 tablets by  mouth every 4 (four) hours as needed for severe pain. 10 tablet 0  . insulin aspart (NOVOLOG) 100 UNIT/ML injection Inject 12-15 Units into the skin 3 (three) times daily before meals. Per sliding scale    . insulin glargine (LANTUS) 100 UNIT/ML injection Inject 0.08 mLs (8 Units total) into the skin at bedtime. 10 mL 0  . methocarbamol (ROBAXIN) 500 MG tablet Take 1 tablet (500 mg total) by mouth 2 (two) times daily. 20 tablet 0  . metoCLOPramide (REGLAN) 5 MG tablet Take 5 mg by mouth at bedtime.    . midodrine (PROAMATINE) 10 MG tablet Take 1 tablet (10 mg total) by mouth 3 (three) times daily with meals. 90 tablet 0  . ondansetron (ZOFRAN ODT) 4 MG disintegrating tablet Take 1 tablet (4 mg total) by mouth every 8 (eight) hours as needed for nausea or vomiting. 10 tablet 0  . oxyCODONE (OXY IR/ROXICODONE) 5 MG immediate release tablet Take 1 tablet (5 mg total) by mouth every 4 (four) hours as needed for severe pain. 30 tablet 0  . oxymetazoline (AFRIN NASAL SPRAY) 0.05 % nasal spray Place 1 spray into both nostrils 2 (two) times daily. 30 mL 0  . predniSONE (DELTASONE) 10 MG tablet 5,4,3,2,1 taper 15 tablet 0  . tiotropium (SPIRIVA) 18 MCG inhalation capsule Place 18 mcg into inhaler and inhale daily.    Marland Kitchen  vancomycin (VANCOCIN) 1 GM/200ML SOLN Inject 200 mLs (1,000 mg total) into the vein Every Tuesday,Thursday,and Saturday with dialysis. 4000 mL    No current facility-administered medications for this visit.    REVIEW OF SYSTEMS:  '[X]'$  denotes positive finding, '[ ]'$  denotes negative finding Vascular    Leg swelling    Cardiac    Chest pain or chest pressure:    Shortness of breath upon exertion:    Short of breath when lying flat:    Irregular heart rhythm:    Constitutional    Fever or chills:     PHYSICAL EXAM:   Vitals:   03/01/21 1501  BP: 112/72  Pulse: 73  Resp: 20  Temp: 98.1 F (36.7 C)  SpO2: 92%  Weight: 112.9 kg  Height: '5\' 1"'$  (1.549 m)    GENERAL: The patient  is a well-nourished female, in no acute distress. The vital signs are documented above. CARDIOVASCULAR: There is a regular rate and rhythm. PULMONARY: There is good air exchange bilaterally without wheezing or rales. VASCULAR: She does have a thrill in her left thigh AV graft although the graft is somewhat pulsatile.  The graft is degenerative and looks like its been revised more than once.  She has a small blister over the lateral aspect of her graft as shown in the 2 photographs below.        DATA:   LABS: Her potassium on 01/19/2021 was 3.7.  MEDICAL ISSUES:   ULCERATION OVER LATERAL ASPECT OF LEFT THIGH AV GRAFT: This patient has a small blister over the lateral aspect of her left thigh AV graft.  I do not think that it is eminent risk of bleeding, however I think this could quickly progressed to something that is more worrisome and I have recommended revision of her graft Friday.  She dialyzes Tuesdays Thursdays Saturdays and this is a nondialysis day.  I think that we would be able to bypass around this area with a new interposition graft and then excised the involved segment once these wounds were closed and Dermabonded.  I think they would still have plenty a graft to cannulate proximal and distal to the revision.  I am out of town Friday so I will schedule this with one of my partners  Deitra Mayo [facility]

## 2021-03-01 NOTE — H&P (View-Only) (Signed)
Patient name: [name] MRN: [id number] DOB: 15-May-1961 Sex: female  REASON FOR VISIT:   Wound overlying left thigh AV graft.  HPI:   [name] is a pleasant 60 y.o. female who had a left thigh AV graft placed in 1997.  Of note she is also had a previous right thigh AV graft. Her most recent surgical revision that I can tell was in September 2016 when she presented with fevers and a positive blood culture.  She had an area over the lateral aspect of her graft that was draining and I replaced the lateral half of the graft with a 7 mm interposition PTFE graft.  She dialyzes on Tuesdays Thursdays and Saturdays.  About a week ago she began to notice a small blister over the lateral aspect of her left thigh AV graft.  She was seen at the dialysis center and added on today.  She denies any specific complaints.  She tells me that her graft has been working well.  She denies fever or chills.  She is not on any blood thinners.  Current Outpatient Medications  Medication Sig Dispense Refill  . albuterol (PROVENTIL HFA;VENTOLIN HFA) 108 (90 BASE) MCG/ACT inhaler Inhale 2 puffs into the lungs every 6 (six) hours as needed. For shortness of breath. (Patient taking differently: Inhale 2 puffs into the lungs every 6 (six) hours as needed for wheezing or shortness of breath.) 1 Inhaler 0  . ATROVENT HFA 17 MCG/ACT inhaler Inhale 1 puff into the lungs every 6 (six) hours as needed. Wheezing  0  . B Complex-C (B-COMPLEX WITH VITAMIN C) tablet Take 1 tablet by mouth daily.    . benzonatate (TESSALON) 100 MG capsule Take 2 capsules (200 mg total) by mouth 2 (two) times daily as needed for cough. 20 capsule 0  . calcium acetate (PHOSLO) 667 MG capsule Take 1,334 mg by mouth 3 (three) times daily with meals.    . calcium elemental as carbonate (BARIATRIC TUMS ULTRA) 400 MG chewable tablet Chew 1,000 mg by mouth daily.    Marland Kitchen HYDROcodone-acetaminophen (NORCO/VICODIN) 5-325 MG tablet Take 1-2 tablets by  mouth every 4 (four) hours as needed for severe pain. 10 tablet 0  . insulin aspart (NOVOLOG) 100 UNIT/ML injection Inject 12-15 Units into the skin 3 (three) times daily before meals. Per sliding scale    . insulin glargine (LANTUS) 100 UNIT/ML injection Inject 0.08 mLs (8 Units total) into the skin at bedtime. 10 mL 0  . methocarbamol (ROBAXIN) 500 MG tablet Take 1 tablet (500 mg total) by mouth 2 (two) times daily. 20 tablet 0  . metoCLOPramide (REGLAN) 5 MG tablet Take 5 mg by mouth at bedtime.    . midodrine (PROAMATINE) 10 MG tablet Take 1 tablet (10 mg total) by mouth 3 (three) times daily with meals. 90 tablet 0  . ondansetron (ZOFRAN ODT) 4 MG disintegrating tablet Take 1 tablet (4 mg total) by mouth every 8 (eight) hours as needed for nausea or vomiting. 10 tablet 0  . oxyCODONE (OXY IR/ROXICODONE) 5 MG immediate release tablet Take 1 tablet (5 mg total) by mouth every 4 (four) hours as needed for severe pain. 30 tablet 0  . oxymetazoline (AFRIN NASAL SPRAY) 0.05 % nasal spray Place 1 spray into both nostrils 2 (two) times daily. 30 mL 0  . predniSONE (DELTASONE) 10 MG tablet 5,4,3,2,1 taper 15 tablet 0  . tiotropium (SPIRIVA) 18 MCG inhalation capsule Place 18 mcg into inhaler and inhale daily.    Marland Kitchen  vancomycin (VANCOCIN) 1 GM/200ML SOLN Inject 200 mLs (1,000 mg total) into the vein Every Tuesday,Thursday,and Saturday with dialysis. 4000 mL    No current facility-administered medications for this visit.    REVIEW OF SYSTEMS:  '[X]'$  denotes positive finding, '[ ]'$  denotes negative finding Vascular    Leg swelling    Cardiac    Chest pain or chest pressure:    Shortness of breath upon exertion:    Short of breath when lying flat:    Irregular heart rhythm:    Constitutional    Fever or chills:     PHYSICAL EXAM:   Vitals:   03/01/21 1501  BP: 112/72  Pulse: 73  Resp: 20  Temp: 98.1 F (36.7 C)  SpO2: 92%  Weight: 112.9 kg  Height: '5\' 1"'$  (1.549 m)    GENERAL: The patient  is a well-nourished female, in no acute distress. The vital signs are documented above. CARDIOVASCULAR: There is a regular rate and rhythm. PULMONARY: There is good air exchange bilaterally without wheezing or rales. VASCULAR: She does have a thrill in her left thigh AV graft although the graft is somewhat pulsatile.  The graft is degenerative and looks like its been revised more than once.  She has a small blister over the lateral aspect of her graft as shown in the 2 photographs below.        DATA:   LABS: Her potassium on 01/19/2021 was 3.7.  MEDICAL ISSUES:   ULCERATION OVER LATERAL ASPECT OF LEFT THIGH AV GRAFT: This patient has a small blister over the lateral aspect of her left thigh AV graft.  I do not think that it is eminent risk of bleeding, however I think this could quickly progressed to something that is more worrisome and I have recommended revision of her graft Friday.  She dialyzes Tuesdays Thursdays Saturdays and this is a nondialysis day.  I think that we would be able to bypass around this area with a new interposition graft and then excised the involved segment once these wounds were closed and Dermabonded.  I think they would still have plenty a graft to cannulate proximal and distal to the revision.  I am out of town Friday so I will schedule this with one of my partners  Deitra Mayo [facility] [place]

## 2021-03-02 ENCOUNTER — Other Ambulatory Visit (HOSPITAL_COMMUNITY)
Admission: RE | Admit: 2021-03-02 | Discharge: 2021-03-02 | Disposition: A | Discharge status: Home / Self Care | Payer: Medicare Other | Source: Ambulatory Visit | Attending: Vascular Surgery | Admitting: Vascular Surgery

## 2021-03-02 ENCOUNTER — Encounter (HOSPITAL_COMMUNITY): Payer: Self-pay | Admitting: Vascular Surgery

## 2021-03-02 DIAGNOSIS — Z01812 Encounter for preprocedural laboratory examination: Secondary | ICD-10-CM | POA: Insufficient documentation

## 2021-03-02 DIAGNOSIS — Z20822 Contact with and (suspected) exposure to covid-19: Secondary | ICD-10-CM | POA: Insufficient documentation

## 2021-03-02 LAB — SARS CORONAVIRUS 2 (TAT 6-24 HRS): SARS Coronavirus 2: NEGATIVE

## 2021-03-02 NOTE — Progress Notes (Signed)
Mrs Appiah denies chest pain or shortness of breath. Patient was tested for Covid and has been in quarantine since that time.  Mrs. Byers has type II diabetes, patients states that CBGs have been running 115-120.  Mrs. Mcgahee has takes Novolog per Sliding Scale with breakfast or evening meal; patient states that the coverage starts at 200.  I instructed patient to check CBG after awaking and every 2 hours until arrival  to the hospital.  I Instructed patient if CBG is less than 70 to take 4 Glucose Tablets or 1 tube of Glucose Gel or 1/2 cup of a clear juice. Recheck CBG in 15 minutes if CBG is not over 70 call, pre- op desk at [phone] for further instructions. If scheduled to receive Insulin, do not take Insulin

## 2021-03-03 ENCOUNTER — Other Ambulatory Visit: Payer: Self-pay

## 2021-03-03 ENCOUNTER — Encounter (HOSPITAL_COMMUNITY): Payer: Self-pay | Admitting: Vascular Surgery

## 2021-03-03 ENCOUNTER — Ambulatory Visit (HOSPITAL_COMMUNITY): Payer: Medicare Other | Admitting: Anesthesiology

## 2021-03-03 ENCOUNTER — Observation Stay (HOSPITAL_COMMUNITY)
Admission: RE | Admit: 2021-03-03 | Discharge: 2021-03-04 | Disposition: A | Discharge status: Home / Self Care | Payer: Medicare Other | Attending: Vascular Surgery | Admitting: Vascular Surgery

## 2021-03-03 ENCOUNTER — Encounter (HOSPITAL_COMMUNITY)
Admission: RE | Disposition: A | Discharge status: Home / Self Care | Payer: Self-pay | Source: Home / Self Care | Attending: Vascular Surgery

## 2021-03-03 DIAGNOSIS — Z794 Long term (current) use of insulin: Secondary | ICD-10-CM | POA: Insufficient documentation

## 2021-03-03 DIAGNOSIS — T827XXS Infection and inflammatory reaction due to other cardiac and vascular devices, implants and grafts, sequela: Secondary | ICD-10-CM

## 2021-03-03 DIAGNOSIS — Z992 Dependence on renal dialysis: Secondary | ICD-10-CM | POA: Insufficient documentation

## 2021-03-03 DIAGNOSIS — Y829 Unspecified medical devices associated with adverse incidents: Secondary | ICD-10-CM | POA: Insufficient documentation

## 2021-03-03 DIAGNOSIS — Z79899 Other long term (current) drug therapy: Secondary | ICD-10-CM | POA: Insufficient documentation

## 2021-03-03 DIAGNOSIS — R0902 Hypoxemia: Secondary | ICD-10-CM

## 2021-03-03 DIAGNOSIS — T82898A Other specified complication of vascular prosthetic devices, implants and grafts, initial encounter: Principal | ICD-10-CM | POA: Insufficient documentation

## 2021-03-03 DIAGNOSIS — N186 End stage renal disease: Secondary | ICD-10-CM

## 2021-03-03 DIAGNOSIS — Z9889 Other specified postprocedural states: Secondary | ICD-10-CM

## 2021-03-03 HISTORY — DX: Unspecified osteoarthritis, unspecified site: M19.90

## 2021-03-03 HISTORY — PX: REVISION OF ARTERIOVENOUS GORETEX GRAFT: SHX6073

## 2021-03-03 LAB — COMPREHENSIVE METABOLIC PANEL
ALT: 15 U/L (ref 0–44)
AST: 25 U/L (ref 15–41)
Albumin: 3.8 g/dL (ref 3.5–5.0)
Alkaline Phosphatase: 139 U/L — ABNORMAL HIGH (ref 38–126)
Anion gap: 12 (ref 5–15)
BUN: 18 mg/dL (ref 6–20)
CO2: 26 mmol/L (ref 22–32)
Calcium: 7.6 mg/dL — ABNORMAL LOW (ref 8.9–10.3)
Chloride: 98 mmol/L (ref 98–111)
Creatinine, Ser: 5.7 mg/dL — ABNORMAL HIGH (ref 0.44–1.00)
GFR, Estimated: 8 mL/min — ABNORMAL LOW (ref >60)
Glucose, Bld: 135 mg/dL — ABNORMAL HIGH (ref 70–99)
Potassium: 4.8 mmol/L (ref 3.5–5.1)
Sodium: 136 mmol/L (ref 135–145)
Total Bilirubin: 1 mg/dL (ref 0.3–1.2)
Total Protein: 8 g/dL (ref 6.5–8.1)

## 2021-03-03 LAB — POCT I-STAT, CHEM 8
BUN: 19 mg/dL (ref 6–20)
Calcium, Ion: 0.95 mmol/L — ABNORMAL LOW (ref 1.15–1.40)
Chloride: 96 mmol/L — ABNORMAL LOW (ref 98–111)
Creatinine, Ser: 5.2 mg/dL — ABNORMAL HIGH (ref 0.44–1.00)
Glucose, Bld: 87 mg/dL (ref 70–99)
HCT: 34 % — ABNORMAL LOW (ref 36.0–46.0)
Hemoglobin: 11.6 g/dL — ABNORMAL LOW (ref 12.0–15.0)
Potassium: 3.7 mmol/L (ref 3.5–5.1)
Sodium: 140 mmol/L (ref 135–145)
TCO2: 32 mmol/L (ref 22–32)

## 2021-03-03 LAB — SURGICAL PCR SCREEN
MRSA, PCR: NEGATIVE
Staphylococcus aureus: POSITIVE — AB

## 2021-03-03 LAB — GLUCOSE, CAPILLARY
Glucose-Capillary: 87 mg/dL (ref 70–99)
Glucose-Capillary: 66 mg/dL — ABNORMAL LOW (ref 70–99)
Glucose-Capillary: 100 mg/dL — ABNORMAL HIGH (ref 70–99)
Glucose-Capillary: 121 mg/dL — ABNORMAL HIGH (ref 70–99)

## 2021-03-03 LAB — PROTIME-INR
INR: 1.2 (ref 0.8–1.2)
Prothrombin Time: 14.5 seconds (ref 11.4–15.2)

## 2021-03-03 LAB — HIV ANTIBODY (ROUTINE TESTING W REFLEX): HIV Screen 4th Generation wRfx: NONREACTIVE

## 2021-03-03 LAB — CBC
HCT: 32.5 % — ABNORMAL LOW (ref 36.0–46.0)
Hemoglobin: 10 g/dL — ABNORMAL LOW (ref 12.0–15.0)
MCH: 29.2 pg (ref 26.0–34.0)
MCHC: 30.8 g/dL (ref 30.0–36.0)
MCV: 95 fL (ref 80.0–100.0)
Platelets: 174 10*3/uL (ref 150–400)
RBC: 3.42 MIL/uL — ABNORMAL LOW (ref 3.87–5.11)
RDW: 15.5 % (ref 11.5–15.5)
WBC: 7.3 10*3/uL (ref 4.0–10.5)
nRBC: 0 % (ref 0.0–0.2)

## 2021-03-03 SURGERY — REVISION OF ARTERIOVENOUS GORETEX GRAFT
Anesthesia: General | Site: Thigh | Laterality: Left

## 2021-03-03 MED ORDER — STERILE WATER FOR IRRIGATION IR SOLN
Status: DC | PRN
  Administered 2021-03-03: 1000 mL

## 2021-03-03 MED ORDER — CHLORHEXIDINE GLUCONATE 4 % EX LIQD: 60.0000 mL | Freq: Once | CUTANEOUS | Status: DC

## 2021-03-03 MED ORDER — CHLORHEXIDINE GLUCONATE 0.12 % MT SOLN
OROMUCOSAL | Status: AC
  Administered 2021-03-03: 15 mL
  Filled 2021-03-03: qty 15

## 2021-03-03 MED ORDER — HYDRALAZINE HCL 20 MG/ML IJ SOLN: 5.0000 mg | INTRAMUSCULAR | Status: DC | PRN

## 2021-03-03 MED ORDER — MIDAZOLAM HCL 2 MG/2ML IJ SOLN
INTRAMUSCULAR | Status: AC
  Filled 2021-03-03: qty 2

## 2021-03-03 MED ORDER — HEPARIN SODIUM (PORCINE) 1000 UNIT/ML IJ SOLN
INTRAMUSCULAR | Status: DC | PRN
  Administered 2021-03-03: 10000 [IU] via INTRAVENOUS

## 2021-03-03 MED ORDER — GUAIFENESIN-DM 100-10 MG/5ML PO SYRP: 15.0000 mL | ORAL_SOLUTION | ORAL | Status: DC | PRN

## 2021-03-03 MED ORDER — LIDOCAINE 2% (20 MG/ML) 5 ML SYRINGE
INTRAMUSCULAR | Status: AC
  Filled 2021-03-03: qty 5

## 2021-03-03 MED ORDER — FENTANYL CITRATE (PF) 250 MCG/5ML IJ SOLN
INTRAMUSCULAR | Status: AC
  Filled 2021-03-03: qty 5

## 2021-03-03 MED ORDER — PROPOFOL 10 MG/ML IV BOLUS
INTRAVENOUS | Status: DC | PRN
  Administered 2021-03-03: 150 mg via INTRAVENOUS
  Administered 2021-03-03 (×2): 50 mg via INTRAVENOUS

## 2021-03-03 MED ORDER — DEXAMETHASONE SODIUM PHOSPHATE 10 MG/ML IJ SOLN
INTRAMUSCULAR | Status: AC
  Filled 2021-03-03: qty 1

## 2021-03-03 MED ORDER — SUCCINYLCHOLINE CHLORIDE 200 MG/10ML IV SOSY
PREFILLED_SYRINGE | INTRAVENOUS | Status: AC
  Filled 2021-03-03: qty 10

## 2021-03-03 MED ORDER — PHENOL 1.4 % MT LIQD: 1.0000 | OROMUCOSAL | Status: DC | PRN

## 2021-03-03 MED ORDER — OXYCODONE HCL 5 MG PO TABS
5.0000 mg | ORAL_TABLET | ORAL | Status: DC | PRN
Start: 2021-03-03 — End: 2021-03-04
  Administered 2021-03-03: 5 mg via ORAL
  Filled 2021-03-03: qty 1

## 2021-03-03 MED ORDER — FENTANYL CITRATE (PF) 100 MCG/2ML IJ SOLN
25.0000 ug | INTRAMUSCULAR | Status: DC | PRN
Start: 2021-03-03 — End: 2021-03-03

## 2021-03-03 MED ORDER — PHENYLEPHRINE HCL-NACL 10-0.9 MG/250ML-% IV SOLN
INTRAVENOUS | Status: DC | PRN
  Administered 2021-03-03: 40 ug/min via INTRAVENOUS

## 2021-03-03 MED ORDER — OXYCODONE HCL 5 MG/5ML PO SOLN
5.0000 mg | Freq: Once | ORAL | Status: DC | PRN
Start: 2021-03-03 — End: 2021-03-03

## 2021-03-03 MED ORDER — PROTAMINE SULFATE 10 MG/ML IV SOLN
INTRAVENOUS | Status: AC
  Filled 2021-03-03: qty 10

## 2021-03-03 MED ORDER — HEPARIN SODIUM (PORCINE) 5000 UNIT/ML IJ SOLN
5000.0000 [IU] | Freq: Three times a day (TID) | INTRAMUSCULAR | Status: DC
  Administered 2021-03-03 – 2021-03-04 (×2): 5000 [IU] via SUBCUTANEOUS
  Filled 2021-03-03 (×2): qty 1

## 2021-03-03 MED ORDER — POTASSIUM CHLORIDE CRYS ER 20 MEQ PO TBCR
20.0000 meq | EXTENDED_RELEASE_TABLET | Freq: Once | ORAL | Status: AC
  Administered 2021-03-03: 20 meq via ORAL
  Filled 2021-03-03: qty 1

## 2021-03-03 MED ORDER — SODIUM CHLORIDE 0.9 % IV SOLN
INTRAVENOUS | Status: DC | PRN
  Administered 2021-03-03: 500 mL

## 2021-03-03 MED ORDER — NALOXONE HCL 0.4 MG/ML IJ SOLN
INTRAMUSCULAR | Status: DC | PRN
  Administered 2021-03-03: 40 ug via INTRAVENOUS
  Administered 2021-03-03: 80 ug via INTRAVENOUS

## 2021-03-03 MED ORDER — ACETAMINOPHEN 325 MG PO TABS
650.0000 mg | ORAL_TABLET | Freq: Four times a day (QID) | ORAL | Status: DC
  Administered 2021-03-03: 650 mg via ORAL
  Filled 2021-03-03 (×2): qty 2

## 2021-03-03 MED ORDER — OXYCODONE-ACETAMINOPHEN 5-325 MG PO TABS: 1.0000 | ORAL_TABLET | ORAL | 0 refills | Status: AC | PRN

## 2021-03-03 MED ORDER — 0.9 % SODIUM CHLORIDE (POUR BTL) OPTIME
TOPICAL | Status: DC | PRN
  Administered 2021-03-03: 1000 mL

## 2021-03-03 MED ORDER — DEXAMETHASONE SODIUM PHOSPHATE 10 MG/ML IJ SOLN
INTRAMUSCULAR | Status: DC | PRN
  Administered 2021-03-03: 4 mg via INTRAVENOUS

## 2021-03-03 MED ORDER — SODIUM CHLORIDE 0.9 % IV SOLN
INTRAVENOUS | Status: AC
  Filled 2021-03-03: qty 1.2

## 2021-03-03 MED ORDER — PROMETHAZINE HCL 25 MG/ML IJ SOLN: 6.2500 mg | INTRAMUSCULAR | Status: DC | PRN

## 2021-03-03 MED ORDER — ACETAMINOPHEN 10 MG/ML IV SOLN
INTRAVENOUS | Status: AC
  Filled 2021-03-03: qty 100

## 2021-03-03 MED ORDER — ONDANSETRON HCL 4 MG/2ML IJ SOLN
INTRAMUSCULAR | Status: AC
  Filled 2021-03-03: qty 2

## 2021-03-03 MED ORDER — PROTAMINE SULFATE 10 MG/ML IV SOLN
INTRAVENOUS | Status: AC
  Filled 2021-03-03: qty 5

## 2021-03-03 MED ORDER — LABETALOL HCL 5 MG/ML IV SOLN: 10.0000 mg | INTRAVENOUS | Status: DC | PRN

## 2021-03-03 MED ORDER — OXYCODONE HCL 5 MG PO TABS: 5.0000 mg | ORAL_TABLET | Freq: Once | ORAL | Status: DC | PRN

## 2021-03-03 MED ORDER — ACETAMINOPHEN 10 MG/ML IV SOLN
1000.0000 mg | Freq: Once | INTRAVENOUS | Status: DC | PRN
  Administered 2021-03-03: 1000 mg via INTRAVENOUS

## 2021-03-03 MED ORDER — HEPARIN SODIUM (PORCINE) 1000 UNIT/ML IJ SOLN
INTRAMUSCULAR | Status: AC
  Filled 2021-03-03: qty 1

## 2021-03-03 MED ORDER — NALOXONE HCL 0.4 MG/ML IJ SOLN
INTRAMUSCULAR | Status: AC
  Filled 2021-03-03: qty 1

## 2021-03-03 MED ORDER — VANCOMYCIN HCL 1500 MG/300ML IV SOLN
1500.0000 mg | INTRAVENOUS | Status: AC
  Administered 2021-03-03: 1500 mg via INTRAVENOUS
  Filled 2021-03-03: qty 300

## 2021-03-03 MED ORDER — PROTAMINE SULFATE 10 MG/ML IV SOLN
INTRAVENOUS | Status: DC | PRN
  Administered 2021-03-03 (×4): 20 mg via INTRAVENOUS
  Administered 2021-03-03: 30 mg via INTRAVENOUS
  Administered 2021-03-03 (×2): 20 mg via INTRAVENOUS

## 2021-03-03 MED ORDER — MIDAZOLAM HCL 2 MG/2ML IJ SOLN
INTRAMUSCULAR | Status: DC | PRN
  Administered 2021-03-03: .5 mg via INTRAVENOUS

## 2021-03-03 MED ORDER — FLUMAZENIL 0.5 MG/5ML IV SOLN
0.2000 mg | Freq: Once | INTRAVENOUS | Status: AC
  Administered 2021-03-03: 0.2 mg via INTRAVENOUS

## 2021-03-03 MED ORDER — DEXTROSE 50 % IV SOLN
12.5000 g | INTRAVENOUS | Status: AC
  Filled 2021-03-03: qty 50

## 2021-03-03 MED ORDER — ONDANSETRON HCL 4 MG/2ML IJ SOLN
INTRAMUSCULAR | Status: DC | PRN
  Administered 2021-03-03: 4 mg via INTRAVENOUS

## 2021-03-03 MED ORDER — GLYCOPYRROLATE PF 0.2 MG/ML IJ SOSY
PREFILLED_SYRINGE | INTRAMUSCULAR | Status: DC | PRN
  Administered 2021-03-03: .2 mg via INTRAVENOUS

## 2021-03-03 MED ORDER — HEMOSTATIC AGENTS (NO CHARGE) OPTIME
TOPICAL | Status: DC | PRN
  Administered 2021-03-03 (×2): 1 via TOPICAL

## 2021-03-03 MED ORDER — FLUMAZENIL 0.5 MG/5ML IV SOLN
INTRAVENOUS | Status: AC
  Administered 2021-03-03: 0.2 mg
  Filled 2021-03-03: qty 5

## 2021-03-03 MED ORDER — FENTANYL CITRATE (PF) 100 MCG/2ML IJ SOLN
INTRAMUSCULAR | Status: DC | PRN
  Administered 2021-03-03: 150 ug via INTRAVENOUS

## 2021-03-03 MED ORDER — ALUM & MAG HYDROXIDE-SIMETH 200-200-20 MG/5ML PO SUSP: 15.0000 mL | ORAL | Status: DC | PRN

## 2021-03-03 MED ORDER — DEXTROSE 50 % IV SOLN
INTRAVENOUS | Status: AC
  Administered 2021-03-03: 12.5 g via INTRAVENOUS
  Filled 2021-03-03: qty 50

## 2021-03-03 MED ORDER — ONDANSETRON HCL 4 MG/2ML IJ SOLN: 4.0000 mg | Freq: Four times a day (QID) | INTRAMUSCULAR | Status: DC | PRN

## 2021-03-03 MED ORDER — SODIUM CHLORIDE 0.9 % IV SOLN: INTRAVENOUS | Status: DC

## 2021-03-03 MED ORDER — PANTOPRAZOLE SODIUM 40 MG PO TBEC
40.0000 mg | DELAYED_RELEASE_TABLET | Freq: Every day | ORAL | Status: DC
  Administered 2021-03-03: 40 mg via ORAL
  Filled 2021-03-03: qty 1

## 2021-03-03 MED ORDER — GLYCOPYRROLATE PF 0.2 MG/ML IJ SOSY
PREFILLED_SYRINGE | INTRAMUSCULAR | Status: AC
  Filled 2021-03-03: qty 1

## 2021-03-03 MED ORDER — SUCCINYLCHOLINE CHLORIDE 20 MG/ML IJ SOLN
INTRAMUSCULAR | Status: DC | PRN
  Administered 2021-03-03: 120 mg via INTRAVENOUS

## 2021-03-03 MED ORDER — METOPROLOL TARTRATE 5 MG/5ML IV SOLN: 2.0000 mg | INTRAVENOUS | Status: DC | PRN

## 2021-03-03 MED ORDER — PROPOFOL 10 MG/ML IV BOLUS
INTRAVENOUS | Status: AC
  Filled 2021-03-03: qty 40

## 2021-03-03 MED ORDER — LIDOCAINE HCL (PF) 1 % IJ SOLN
INTRAMUSCULAR | Status: AC
  Filled 2021-03-03: qty 30

## 2021-03-03 MED ORDER — LIDOCAINE 2% (20 MG/ML) 5 ML SYRINGE
INTRAMUSCULAR | Status: DC | PRN
  Administered 2021-03-03: 60 mg via INTRAVENOUS

## 2021-03-03 SURGICAL SUPPLY — 42 items
ADH SKN CLS APL DERMABOND .7 (GAUZE/BANDAGES/DRESSINGS) ×2
AGENT HMST SPONGE THK3/8 (HEMOSTASIS) ×2
CANISTER SUCT 3000ML PPV (MISCELLANEOUS) ×2 IMPLANT
CANNULA VESSEL 3MM 2 BLNT TIP (CANNULA) ×2 IMPLANT
CLIP VESOCCLUDE MED 6/CT (CLIP) ×2 IMPLANT
CLIP VESOCCLUDE SM WIDE 6/CT (CLIP) ×2 IMPLANT
CNTNR URN SCR LID CUP LEK RST (MISCELLANEOUS) IMPLANT
CONT SPEC 4OZ STRL OR WHT (MISCELLANEOUS) ×2
COVER WAND RF STERILE (DRAPES) ×2 IMPLANT
DECANTER SPIKE VIAL GLASS SM (MISCELLANEOUS) ×2 IMPLANT
DERMABOND ADVANCED (GAUZE/BANDAGES/DRESSINGS) ×2
DERMABOND ADVANCED .7 DNX12 (GAUZE/BANDAGES/DRESSINGS) ×1 IMPLANT
ELECT REM PT RETURN 9FT ADLT (ELECTROSURGICAL) ×2
ELECTRODE REM PT RTRN 9FT ADLT (ELECTROSURGICAL) ×1 IMPLANT
GAUZE 4X4 16PLY RFD (DISPOSABLE) ×1 IMPLANT
GAUZE SPONGE 4X4 12PLY STRL (GAUZE/BANDAGES/DRESSINGS) ×1 IMPLANT
GOWN STRL REUS W/ TWL LRG LVL3 (GOWN DISPOSABLE) ×3 IMPLANT
GOWN STRL REUS W/TWL LRG LVL3 (GOWN DISPOSABLE) ×6
GRAFT GORETEX STND 6X20 (Vascular Products) ×2 IMPLANT
GRAFT GORETEXSTD 6X20 (Vascular Products) IMPLANT
HEMOSTAT ARISTA ABSORB 3G PWDR (HEMOSTASIS) ×1 IMPLANT
HEMOSTAT SPONGE AVITENE ULTRA (HEMOSTASIS) ×2 IMPLANT
KIT BASIN OR (CUSTOM PROCEDURE TRAY) ×2 IMPLANT
KIT TURNOVER KIT B (KITS) ×2 IMPLANT
LOOP VESSEL MAXI BLUE (MISCELLANEOUS) ×1 IMPLANT
LOOP VESSEL MINI RED (MISCELLANEOUS) IMPLANT
NDL HYPO 25GX1X1/2 BEV (NEEDLE) ×1 IMPLANT
NEEDLE HYPO 25GX1X1/2 BEV (NEEDLE) ×2 IMPLANT
NS IRRIG 1000ML POUR BTL (IV SOLUTION) ×2 IMPLANT
PACK CV ACCESS (CUSTOM PROCEDURE TRAY) ×2 IMPLANT
PAD ARMBOARD 7.5X6 YLW CONV (MISCELLANEOUS) ×4 IMPLANT
SPONGE LAP 18X18 RF (DISPOSABLE) ×1 IMPLANT
SPONGE SURGIFOAM ABS GEL 100 (HEMOSTASIS) IMPLANT
SUT PROLENE 5 0 C 1 24 (SUTURE) ×2 IMPLANT
SUT PROLENE 5 0 C 1 36 (SUTURE) ×1 IMPLANT
SUT PROLENE 6 0 CC (SUTURE) ×5 IMPLANT
SUT VIC AB 3-0 SH 27 (SUTURE) ×10
SUT VIC AB 3-0 SH 27X BRD (SUTURE) ×1 IMPLANT
SUT VICRYL 4-0 PS2 18IN ABS (SUTURE) ×4 IMPLANT
TOWEL GREEN STERILE (TOWEL DISPOSABLE) ×2 IMPLANT
UNDERPAD 30X36 HEAVY ABSORB (UNDERPADS AND DIAPERS) ×2 IMPLANT
WATER STERILE IRR 1000ML POUR (IV SOLUTION) ×2 IMPLANT

## 2021-03-03 NOTE — Anesthesia Preprocedure Evaluation (Addendum)
Anesthesia Evaluation  Patient identified by MRN, date of birth, ID band Patient awake    Reviewed: Allergy & Precautions, NPO status , Patient's Chart, lab work & pertinent test results  Airway Mallampati: III  TM Distance: >3 FB Neck ROM: Full    Dental  (+) Missing   Pulmonary shortness of breath and with exertion, sleep apnea and Continuous Positive Airway Pressure Ventilation , COPD,  COPD inhaler, PE   Pulmonary exam normal breath sounds clear to auscultation       Cardiovascular negative cardio ROS Normal cardiovascular exam Rhythm:Regular Rate:Normal  ECG: SR, rate 70   Neuro/Psych negative neurological ROS  negative psych ROS   GI/Hepatic negative GI ROS, Neg liver ROS,   Endo/Other  diabetes, Insulin DependentMorbid obesity  Renal/GU ESRF and DialysisRenal disease     Musculoskeletal negative musculoskeletal ROS (+)   Abdominal   Peds  Hematology negative hematology ROS (+)   Anesthesia Other Findings COMPLICATION OF AV GRAFT ESRD  Reproductive/Obstetrics                            Anesthesia Physical Anesthesia Plan  ASA: III  Anesthesia Plan: General   Post-op Pain Management:    Induction: Intravenous  PONV Risk Score and Plan: Ondansetron, Dexamethasone and Treatment may vary due to age or medical condition  Airway Management Planned: Oral ETT  Additional Equipment:   Intra-op Plan:   Post-operative Plan: Extubation in OR  Informed Consent: I have reviewed the patients History and Physical, chart, labs and discussed the procedure including the risks, benefits and alternatives for the proposed anesthesia with the patient or authorized representative who has indicated his/her understanding and acceptance.     Dental advisory given  Plan Discussed with: CRNA  Anesthesia Plan Comments:        Anesthesia Quick Evaluation

## 2021-03-03 NOTE — Anesthesia Procedure Notes (Signed)
Procedure Name: Intubation Date/Time: 03/03/2021 1:33 PM Performed by: Renato Shin, CRNA Pre-anesthesia Checklist: Patient identified, Emergency Drugs available, Suction available and Patient being monitored Patient Re-evaluated:Patient Re-evaluated prior to induction Oxygen Delivery Method: Circle system utilized Preoxygenation: Pre-oxygenation with 100% oxygen Induction Type: IV induction Ventilation: Mask ventilation without difficulty Laryngoscope Size: Miller and 2 Grade View: Grade I Tube type: Oral Tube size: 7.0 mm Number of attempts: 1 Airway Equipment and Method: Stylet and Oral airway Placement Confirmation: ETT inserted through vocal cords under direct vision,  positive ETCO2 and breath sounds checked- equal and bilateral Secured at: 22 cm Tube secured with: Tape Dental Injury: Teeth and Oropharynx as per pre-operative assessment  Difficulty Due To: Difficulty was anticipated and Difficult Airway- due to large tongue Future Recommendations: Recommend- induction with short-acting agent, and alternative techniques readily available

## 2021-03-03 NOTE — Progress Notes (Signed)
Notified Bedside RN and Respiratory of Pt's O2 sats consistently upper 70's- 80's while on CPAP. Pt in no distress per bedside nurse and per camera assessment. Respiratory to assess.

## 2021-03-03 NOTE — Progress Notes (Signed)
Pt arrived from unit from PACU. .Will continue to monitor. Pt. Oriented to unit. CHG given, Pt on 5L [place] Sp02  92. Pt very sleepy states pain level is 10.   Phoebe Sharps, RN

## 2021-03-03 NOTE — Progress Notes (Signed)
CRITICAL RESULT PROVIDER NOTIFICATION  Test performed and critical result:  CBG 66   Date and time result received:  1146  Provider name/title: Dr. Oneida Alar Dr. Roanna Banning  Provider response: n/a; Anesthesia protocol for hypoglycemia. 12.5g of 50% Dextrose injection administered intravenously. Pt with not acute symptoms, resting. Will re-check CBG in 15 mins.   Jacqlyn Larsen, RN

## 2021-03-03 NOTE — Discharge Instructions (Signed)
[facility]  Discharge Instructions  AV Fistula or Graft Surgery for Dialysis Access  Please refer to the following instructions for your post-procedure care. Your surgeon or physician assistant will discuss any changes with you.  Activity  You may drive the day following your surgery, if you are comfortable and no longer taking prescription pain medication. Resume full activity as the soreness in your incision resolves.  Bathing/Showering  You may shower after you go home. Keep your incision dry for 48 hours. Do not soak in a bathtub, hot tub, or swim until the incision heals completely. You may not shower if you have a hemodialysis catheter.  Incision Care  Clean your incision with mild soap and water after 48 hours. Pat the area dry with a clean towel. You do not need a bandage unless otherwise instructed. Do not apply any ointments or creams to your incision. You may have skin glue on your incision. Do not peel it off. It will come off on its own in about one week. Your arm may swell a bit after surgery. To reduce swelling use pillows to elevate your arm so it is above your heart. Your doctor will tell you if you need to lightly wrap your arm with an ACE bandage.  Diet  Resume your normal diet. There are not special food restrictions following this procedure. In order to heal from your surgery, it is CRITICAL to get adequate nutrition. Your body requires vitamins, minerals, and protein. Vegetables are the best source of vitamins and minerals. Vegetables also provide the perfect balance of protein. Processed food has little nutritional value, so try to avoid this.  Medications  Resume taking all of your medications. If your incision is causing pain, you may take over-the counter pain relievers such as acetaminophen (Tylenol). If you were prescribed a stronger pain medication, please be aware these medications can cause nausea and constipation. Prevent  nausea by taking the medication with a snack or meal. Avoid constipation by drinking plenty of fluids and eating foods with high amount of fiber, such as fruits, vegetables, and grains.  Do not take Tylenol if you are taking prescription pain medications.  Follow up Your surgeon may want to see you in the office following your access surgery. If so, this will be arranged at the time of your surgery.  Please call us immediately for any of the following conditions:  . Increased pain, redness, drainage (pus) from your incision site . Fever of 101 degrees or higher . Severe or worsening pain at your incision site . Hand pain or numbness. .  Reduce your risk of vascular disease:  . Stop smoking. If you would like help, call [facility] at 1-800-QUIT-NOW 575 681 0450) or [place] at (925) 008-3438  . Manage your cholesterol . Maintain a desired weight . Control your diabetes . Keep your blood pressure down  Dialysis  It will take several weeks to several months for your new dialysis access to be ready for use. Your surgeon will determine when it is okay to use it. Your nephrologist will continue to direct your dialysis. You can continue to use your Permcath until your new access is ready for use.   03/03/2021 ROAN ALLEGRA [id number] 1961/10/06  Surgeon(s): Fields, Jessy Oto, MD  Procedure(s): REVISION OF LEFT THIGH ARTERIOVENOUS GORETEX GRAFT   May stick graft immediately  X May stick graft on designated area only: do not stick from the 3- 6 o'clock area of graft for 6  weeks   Do not stick left thigh graft in marked area for 6 weeks    If you have any questions, please call the office at [phone].

## 2021-03-03 NOTE — Progress Notes (Signed)
Pt was received to PACU at 1707, and had not waken up enough in the OR to be extubated. Pt was placed on PRVC RR-15, Vt-400, Fi02-40%. She was extubated at 1725 with MD at bedside. Pt was placed on simple mask. RN remains at bedside.

## 2021-03-03 NOTE — Transfer of Care (Signed)
Immediate Anesthesia Transfer of Care Note  Patient: [name]  Procedure(s) Performed: REVISION OF LEFT THIGH ARTERIOVENOUS GORETEX GRAFT (Left Thigh)  Patient Location: PACU  Anesthesia Type:General  Level of Consciousness: awake, patient cooperative and Patient remains intubated per anesthesia plan  Airway & Oxygen Therapy: Patient remains intubated per anesthesia plan and Patient placed on Ventilator (see vital sign flow sheet for setting)  Post-op Assessment: Report given to RN and Post -op Vital signs reviewed and stable  Post vital signs: Reviewed and stable  Last Vitals:  Vitals Value Taken Time  BP 103/52 03/03/21 1710  Temp    Pulse 80 03/03/21 1718  Resp 33 03/03/21 1718  SpO2 96 % 03/03/21 1718  Vitals shown include unvalidated device data.  Last Pain:  Vitals:   03/03/21 0950  TempSrc:   PainSc: 0-No pain         Complications: No complications documented.

## 2021-03-03 NOTE — Interval H&P Note (Signed)
History and Physical Interval Note:  03/03/2021 1:01 PM  [name]  has presented today for surgery, with the diagnosis of COMPLICATION OF AVG, ESRD.  The various methods of treatment have been discussed with the patient and family. After consideration of risks, benefits and other options for treatment, the patient has consented to  Procedure(s): REVISION OF LEFT THIGH ARTERIOVENOUS GORETEX GRAFT (Left) as a surgical intervention.  The patient's history has been reviewed, patient examined, no change in status, stable for surgery.  I have reviewed the patient's chart and labs.  Questions were answered to the patient's satisfaction.     Ruta Hinds

## 2021-03-03 NOTE — Op Note (Signed)
Procedure: Revision left thigh graft with 6 mm interposition PTFE, removal of lateral segment thigh graft  Preoperative diagnosis: Graft degeneration left thigh graft  Postoperative diagnosis: Same  Anesthesia: General  Assistant: Paul Half, PA-C  Operative findings: Graft degeneration lateral segment extending from the 3:00 to the 6 o'clock position  New interposition graft tunneled medial to this  Specimens: Culture of old AV graft  Operative details: After obtaining informed consent, the patient was taken the operating room.  The patient was placed in supine position on the operating table.  After induction of general anesthesia patient's left thigh and groin were prepped and draped in usual sterile fashion.  Next a transverse incision was made over the graft at about the 6 o'clock position on the anterior thigh.  Incision was carried on through subcutaneous tissues down to the level of the graft and this was dissected free circumferentially.  There was multiple venous collaterals which caused some bothersome bleeding which was controlled with cautery and several 5-0 Prolene sutures.  The graft was well incorporated.  An additional incision was then made at the 3 o'clock position over the graft in similar fashion.  There were also large venous collaterals in this location and these were controlled with cautery and 5-0 Prolene.  The graft was dissected free circumferentially.  It was freed up over several centimeters proximally distally in both incisions.  Patient was then given 10,000 units of heparin.  Graft was controlled proximally distally and transected between the 2 clamps.  A new 6 mm interposition graft was tunneled subcutaneously from one incision to the other.  The graft was then sewn end-to-end to the proximal and the distal segment of the old graft using a running 6-0 Prolene suture.  Prior to completion of the anastomosis of each 1 it was forward bled backbled and thoroughly  flushed; the anastomosis was secured clamps were released there was a palpable thrill in the graft immediately and good Doppler flow.  Hemostasis was obtained with 150 mg of protamine as well as Avitene and Arista powder.  These incisions were then closed in multiple layers with running 3-0 and 4-0 Vicryl suture and Dermabond of the skin.  Attention was then turned to the degenerated segment on the lateral aspect of the leg.  A longitudinal incision was made over this and carried down through the subcutaneous tissues down to the level of the disconnected graft.  This again was well incorporated with no obvious evidence of infection.  The entire segment of graft was removed and swapped and sent for culture.  The wound was thoroughly irrigated with normal saline solution.  The skin edges were freshened up with a 15 blade.  The subcutaneous tissues were then reapproximated using a running 3-0 Vicryl suture.  The skin was closed with a 4-0 Vicryl subcuticular stitch.  Patient tired procedure well no complications.  The instrument sponge needle counts correct in the case.  Patient was taken to recovery in stable condition.  Ruta Hinds, MD [facility] [place] Office: [phone]

## 2021-03-04 ENCOUNTER — Observation Stay (HOSPITAL_COMMUNITY): Payer: Medicare Other

## 2021-03-04 DIAGNOSIS — Z992 Dependence on renal dialysis: Secondary | ICD-10-CM | POA: Diagnosis not present

## 2021-03-04 DIAGNOSIS — R400 Somnolence: Secondary | ICD-10-CM | POA: Diagnosis not present

## 2021-03-04 DIAGNOSIS — T82898A Other specified complication of vascular prosthetic devices, implants and grafts, initial encounter: Secondary | ICD-10-CM | POA: Diagnosis not present

## 2021-03-04 DIAGNOSIS — Z9889 Other specified postprocedural states: Secondary | ICD-10-CM

## 2021-03-04 DIAGNOSIS — N186 End stage renal disease: Secondary | ICD-10-CM

## 2021-03-04 LAB — GLUCOSE, CAPILLARY
Glucose-Capillary: 112 mg/dL — ABNORMAL HIGH (ref 70–99)
Glucose-Capillary: 107 mg/dL — ABNORMAL HIGH (ref 70–99)

## 2021-03-04 LAB — BASIC METABOLIC PANEL
Anion gap: 11 (ref 5–15)
BUN: 23 mg/dL — ABNORMAL HIGH (ref 6–20)
CO2: 29 mmol/L (ref 22–32)
Calcium: 7.5 mg/dL — ABNORMAL LOW (ref 8.9–10.3)
Chloride: 96 mmol/L — ABNORMAL LOW (ref 98–111)
Creatinine, Ser: 6.28 mg/dL — ABNORMAL HIGH (ref 0.44–1.00)
GFR, Estimated: 7 mL/min — ABNORMAL LOW (ref >60)
Glucose, Bld: 130 mg/dL — ABNORMAL HIGH (ref 70–99)
Potassium: 4.7 mmol/L (ref 3.5–5.1)
Sodium: 136 mmol/L (ref 135–145)

## 2021-03-04 LAB — CBC
HCT: 29.1 % — ABNORMAL LOW (ref 36.0–46.0)
HCT: 28.8 % — ABNORMAL LOW (ref 36.0–46.0)
Hemoglobin: 8.9 g/dL — ABNORMAL LOW (ref 12.0–15.0)
Hemoglobin: 9.1 g/dL — ABNORMAL LOW (ref 12.0–15.0)
MCH: 29.2 pg (ref 26.0–34.0)
MCH: 29.6 pg (ref 26.0–34.0)
MCHC: 30.6 g/dL (ref 30.0–36.0)
MCHC: 31.6 g/dL (ref 30.0–36.0)
MCV: 95.4 fL (ref 80.0–100.0)
MCV: 93.8 fL (ref 80.0–100.0)
Platelets: 178 10*3/uL (ref 150–400)
Platelets: 185 10*3/uL (ref 150–400)
RBC: 3.05 MIL/uL — ABNORMAL LOW (ref 3.87–5.11)
RBC: 3.07 MIL/uL — ABNORMAL LOW (ref 3.87–5.11)
RDW: 15.4 % (ref 11.5–15.5)
RDW: 15.2 % (ref 11.5–15.5)
WBC: 6.5 10*3/uL (ref 4.0–10.5)
WBC: 6.4 10*3/uL (ref 4.0–10.5)
nRBC: 0 % (ref 0.0–0.2)
nRBC: 0 % (ref 0.0–0.2)

## 2021-03-04 LAB — BLOOD GAS, ARTERIAL
Acid-Base Excess: 2.9 mmol/L — ABNORMAL HIGH (ref 0.0–2.0)
Bicarbonate: 28.5 mmol/L — ABNORMAL HIGH (ref 20.0–28.0)
Drawn by: 60057
FIO2: 60
O2 Saturation: 95.6 %
Patient temperature: 36.4
pCO2 arterial: 56.1 mmHg — ABNORMAL HIGH (ref 32.0–48.0)
pH, Arterial: 7.322 — ABNORMAL LOW (ref 7.350–7.450)
pO2, Arterial: 80.4 mmHg — ABNORMAL LOW (ref 83.0–108.0)

## 2021-03-04 LAB — HEMOGLOBIN A1C
Hgb A1c MFr Bld: 5.4 % (ref 4.8–5.6)
Mean Plasma Glucose: 108.28 mg/dL

## 2021-03-04 LAB — LACTIC ACID, PLASMA: Lactic Acid, Venous: 1.3 mmol/L (ref 0.5–1.9)

## 2021-03-04 MED ORDER — INSULIN ASPART 100 UNIT/ML ~~LOC~~ SOLN: 0.0000 [IU] | SUBCUTANEOUS | Status: DC

## 2021-03-04 MED ORDER — FLUTICASONE FUROATE-VILANTEROL 100-25 MCG/INH IN AEPB
1.0000 | INHALATION_SPRAY | Freq: Every day | RESPIRATORY_TRACT | Status: DC
  Administered 2021-03-04: 1 via RESPIRATORY_TRACT
  Filled 2021-03-04: qty 28

## 2021-03-04 MED ORDER — MIDODRINE HCL 5 MG PO TABS
10.0000 mg | ORAL_TABLET | Freq: Two times a day (BID) | ORAL | Status: DC
  Administered 2021-03-04: 10 mg via ORAL
  Filled 2021-03-04: qty 2

## 2021-03-04 NOTE — Progress Notes (Signed)
Patient d/c from unit. Discharge instructions given and education completed. Pt weaned to room air; O2 sats 90-94%. Pt arranged dialysis session for after d/c.  Raelyn Number, RN 03/04/21 11:20 AM

## 2021-03-04 NOTE — Consult Note (Signed)
NAME:  [name], MRN:  [id number], DOB:  1961-09-30, LOS: 0 ADMISSION DATE:  03/03/2021, CONSULTATION DATE: 3/26 REFERRING MD: Dr. Stanford Breed, CHIEF COMPLAINT: Lethargy  History of Present Illness:  60 year old female with past medical history as below, which is significant for end-stage renal disease on dialysis, chronic hypotension, DM 2, COPD, OSA on CPAP, and SVC syndrome.  She has failed dialysis grafts and both upper extremities is currently dialyzed through groin access.  She presented to [facility] on 3/25 for revision of the left thigh AV graft as the skin over it had developed a blister which was at risk to eventually cause the graft bleed.  She was taken to the OR by Dr. Oneida Alar.  The procedure went as expected and was uncomplicated.  In the postoperative setting she was slow to arouse and was not extubated in the OR as is typical and had to be recovered on the ventilator in the PACU where she was ultimately extubated and was transferred to the floor.  In the very early a.m. hours of 3/26 the patient was experiencing episodes of hypoxemia and lethargy.  Sats dropped into the 70s and she became only briefly arousable to stimulation.  She was started on BiPAP and PCCM was consulted.  Pertinent  Medical History   has a past medical history of Arthritis, Chronic hypotension, Complication of anesthesia (2016), COPD (chronic obstructive pulmonary disease) ([place]), Diabetes mellitus type 2 in obese ([place]), ESRD (end stage renal disease) on dialysis ([place]), GERD (gastroesophageal reflux disease), Iron deficiency anemia, Obesities, morbid ([place]), OSA on CPAP, PE (pulmonary embolism) (~ 2010), Pneumonia, Shortness of breath, and Superior vena cava syndrome.   Significant Hospital Events: Including procedures, antibiotic start and stop dates in addition to other pertinent events   . 3/25 admitted for revision of left thigh AV graft . 3/26 hypoxic, lethargic, started on BiPAP  Interim History /  Subjective:    Objective   Blood pressure (!) 77/43, pulse (!) 55, temperature 97.6 F (36.4 C), temperature source Axillary, resp. rate 10, height '5\' 1"'$  (1.549 m), weight 116.6 kg, SpO2 96 %.    FiO2 (%):  [60 %] 60 %   Intake/Output Summary (Last 24 hours) at 03/04/2021 0246 Last data filed at 03/03/2021 1810 Gross per 24 hour  Intake 900 ml  Output 150 ml  Net 750 ml   Filed Weights   03/03/21 0947  Weight: 116.6 kg    Examination: General: Obese female on BiPAP in no distress HENT: Normocephalic, atraumatic, PERRL Lungs: Clear bilateral breath sounds Cardiovascular: Borderline bradycardic, regular, no murmurs, rubs, or gallops Abdomen: Soft, nontender, nondistended Extremities: No acute deformity, moving all extremities. Neuro: Able to stay awake spontaneously and speaking in full sentences.   Labs/imaging that I havepersonally reviewed  (right click and "Reselect all SmartList Selections" daily)  Creatinine 5.7, calcium 7.6, hemoglobin 10, INR 1.2.  Resolved Hospital Problem list     Assessment & Plan:   Acute hypoxemic respiratory failure: I suspect there is an element of acute hypercapnic failure as well.  This is likely due to decompensated OSA in the postoperative setting.  She does have an OSA history and uses CPAP at home.  She has responded well to BiPAP and is now more awake with O2 sats in the high 90s. -Continue BiPAP -SPO2 goal 90 to 95%.  Hyperoxygenation will result in increased lethargy. -Stat portable chest x-ray -ABG pending -Limit sedating medications  Status post left thigh AV graft revision 3/26 -Per  primary  Hypotension: it is unclear whether she is truly hypotensive as blood pressure cuff is on her right forearm and she has failed dialysis access in both upper extremities.  Her mental status is significantly improved despite MAP slightly low. -Ongoing monitoring -Check lactic acid -Will reorder home midodrine  ESRD on HD Tuesday Thursday  Saturday -We will need nephrology consult for HD later today  DM2 -We will order sliding scale  COPD without acute exacerbation -Start home Advair -As needed DuoNeb  Best practice (right click and "Reselect all SmartList Selections" daily)  Diet:  NPO Pain/Anxiety/Delirium protocol (if indicated): No VAP protocol (if indicated): Not indicated DVT prophylaxis: Subcutaneous Heparin GI prophylaxis: N/A Glucose control:  SSI Yes Central venous access:  N/A Arterial line:  N/A Foley:  N/A Mobility:  bed rest  PT consulted: N/A Last date of multidisciplinary goals of care discussion '[]'$  Code Status:  full code Disposition:   Labs   CBC: Recent Labs  Lab 03/03/21 1032 03/03/21 1817  WBC  --  7.3  HGB 11.6* 10.0*  HCT 34.0* 32.5*  MCV  --  95.0  PLT  --  [id number]    Basic Metabolic Panel: Recent Labs  Lab 03/03/21 1032 03/03/21 1817  NA 140 136  K 3.7 4.8  CL 96* 98  CO2  --  26  GLUCOSE 87 135*  BUN 19 18  CREATININE 5.20* 5.70*  CALCIUM  --  7.6*   GFR: Estimated Creatinine Clearance: 12.5 mL/min (A) (by C-G formula based on SCr of 5.7 mg/dL (H)). Recent Labs  Lab 03/03/21 1817  WBC 7.3    Liver Function Tests: Recent Labs  Lab 03/03/21 1817  AST 25  ALT 15  ALKPHOS 139*  BILITOT 1.0  PROT 8.0  ALBUMIN 3.8   No results for input(s): LIPASE, AMYLASE in the last 168 hours. No results for input(s): AMMONIA in the last 168 hours.  ABG    Component Value Date/Time   PHART 7.242 (L) 08/10/2014 1600   PCO2ART 65.7 (HH) 08/10/2014 1600   PO2ART 81.5 08/10/2014 1600   HCO3 27.3 (H) 08/10/2014 1600   TCO2 32 03/03/2021 1032   ACIDBASEDEF 1.2 08/10/2014 1252   O2SAT 95.7 08/10/2014 1600     Coagulation Profile: Recent Labs  Lab 03/03/21 1817  INR 1.2    Cardiac Enzymes: No results for input(s): CKTOTAL, CKMB, CKMBINDEX, TROPONINI in the last 168 hours.  HbA1C: Hgb A1c MFr Bld  Date/Time Value Ref Range Status  08/16/2015 03:32 AM 6.6 (H)  4.8 - 5.6 % Final    Comment:    (NOTE)         Pre-diabetes: 5.7 - 6.4         Diabetes: >6.4         Glycemic control for adults with diabetes: <7.0   08/13/2015 02:38 AM 6.6 (H) 4.8 - 5.6 % Final    Comment:    (NOTE)         Pre-diabetes: 5.7 - 6.4         Diabetes: >6.4         Glycemic control for adults with diabetes: <7.0     CBG: Recent Labs  Lab 03/03/21 0948 03/03/21 1146 03/03/21 1230 03/03/21 1709  GLUCAP 87 66* 100* 121*    Review of Systems:   Bolds are positive  Constitutional: weight loss, gain, night sweats, Fevers, chills, fatigue .  HEENT: headaches, Sore throat, sneezing, nasal congestion, post nasal drip, Difficulty swallowing, Tooth/dental  problems, visual complaints visual changes, ear ache CV:  chest pain, radiates:,Orthopnea, PND, swelling in lower extremities, dizziness, palpitations, syncope.  GI  heartburn, indigestion, abdominal pain, nausea, vomiting, diarrhea, change in bowel habits, loss of appetite, bloody stools.  Resp: cough, productive: , hemoptysis, dyspnea, chest pain, pleuritic.  Skin: rash or itching or icterus GU: dysuria, change in color of urine, urgency or frequency. flank pain, hematuria  MS: joint pain or swelling. decreased range of motion  Psych: change in mood or affect. depression or anxiety.  Neuro: difficulty with speech, weakness, numbness, ataxia    Past Medical History:  She,  has a past medical history of Arthritis, Chronic hypotension, Complication of anesthesia (2016), COPD (chronic obstructive pulmonary disease) ([place]), Diabetes mellitus type 2 in obese ([place]), ESRD (end stage renal disease) on dialysis ([place]), GERD (gastroesophageal reflux disease), Iron deficiency anemia, Obesities, morbid ([place]), OSA on CPAP, PE (pulmonary embolism) (~ 2010), Pneumonia, Shortness of breath, and Superior vena cava syndrome.   Surgical History:   Past Surgical History:  Procedure Laterality Date  . ARTERIOVENOUS GRAFT PLACEMENT  Left ~ 2011   "thigh" (4  . CARPAL TUNNEL RELEASE Right ~ 2011  . CARPAL TUNNEL RELEASE Right 01/28/2015   Procedure: RIGHT CARPAL TUNNEL RELEASE;  Surgeon: Roseanne Kaufman, MD;  Location: [facility];  Service: Orthopedics;  Laterality: Right;  . PARATHYROIDECTOMY  02/1993  . REVISION OF ARTERIOVENOUS GORETEX GRAFT Left 08/14/2015   Procedure: REVISION OF ARTERIOVENOUS GORETEX GRAFT LEFT THIGH with  REMOVAL of segment of infected graft.;  Surgeon: Angelia Mould, MD;  Location: [facility];  Service: Vascular;  Laterality: Left;  . THROMBECTOMY AND REVISION OF ARTERIOVENTOUS (AV) GORETEX  GRAFT Left ~ 12/2012   "thigh"   . VASCULAR SURGERY       Social History:   reports that she has never smoked. She has never used smokeless tobacco. She reports that she does not drink alcohol and does not use drugs.   Family History:  Her family history includes Diabetes Mellitus II in her father and mother; Hypertension in her father and mother.   Allergies Allergies  Allergen Reactions  . Amoxicillin Anaphylaxis and Other (See Comments)    Throat closes and gets sweaty.  . Penicillins Anaphylaxis    Throat closes and gets sweaty  . Iohexol Other (See Comments)     Desc: scratchy/itchy throat/itching on back / nausea   . Ceftazidime Nausea And Vomiting     Home Medications  Prior to Admission medications   Medication Sig Start Date End Date Taking? Authorizing Provider  acetaminophen (TYLENOL) 500 MG tablet Take 500 mg by mouth every 6 (six) hours as needed for moderate pain or headache.   Yes [provider]  ATROVENT HFA 17 MCG/ACT inhaler Inhale 1 puff into the lungs every 6 (six) hours as needed for wheezing. 01/03/16  Yes [provider]  brimonidine (ALPHAGAN) 0.2 % ophthalmic solution Place 1 drop into both eyes 3 (three) times daily.   Yes [provider]  dorzolamide-timolol (COSOPT) 22.3-6.8 MG/ML ophthalmic solution Place 1 drop into both eyes 2 (two) times daily.    Yes [provider]  doxycycline (VIBRA-TABS) 100 MG tablet Take 100 mg by mouth 2 (two) times daily. 02/27/21  Yes [provider]  ferric citrate (AURYXIA) 1 GM 210 MG(Fe) tablet Take 630 mg by mouth 3 (three) times daily with meals.   Yes [provider]  Ferrous Sulfate (IRON PO) Take 1 tablet by mouth daily.  Yes [provider]  Fluticasone-Salmeterol (ADVAIR) 100-50 MCG/DOSE AEPB Inhale 1 puff into the lungs 2 (two) times daily as needed (shortness of breath).   Yes [provider]  insulin aspart (NOVOLOG) 100 UNIT/ML injection Inject 10-25 Units into the skin 2 (two) times daily. Per sliding scale  Takes in am and evening meal   Yes [provider]  latanoprost (XALATAN) 0.005 % ophthalmic solution Place 1 drop into both eyes at bedtime.   Yes [provider]  metoCLOPramide (REGLAN) 5 MG tablet Take 5 mg by mouth at bedtime.   Yes [provider]  midodrine (PROAMATINE) 10 MG tablet Take 1 tablet (10 mg total) by mouth 3 (three) times daily with meals. Patient taking differently: Take 10 mg by mouth in the morning and at bedtime. 08/18/15  Yes Mikhail, [place], DO  Multiple Vitamin (MULTIVITAMIN WITH MINERALS) TABS tablet Take 1 tablet by mouth daily.   Yes [provider]  oxyCODONE-acetaminophen (PERCOCET) 5-325 MG tablet Take 1 tablet by mouth every 4 (four) hours as needed for severe pain. 03/03/21 03/03/22 Yes Baglia, Corrina, PA-C  traMADol (ULTRAM) 50 MG tablet Take 50 mg by mouth 3 (three) times daily as needed for moderate pain.   Yes [provider]     Critical care time: N/A     Georgann Housekeeper, AGACNP-BC [facility]  See Amion for personal pager PCCM on call pager [phone] until 7pm. Please call Elink 7p-7a. [phone]  03/04/2021 3:02 AM

## 2021-03-04 NOTE — Plan of Care (Signed)
POC initiated and progressing. 

## 2021-03-04 NOTE — Progress Notes (Addendum)
  Progress Note    03/04/2021 10:19 AM 1 Day Post-Op  Subjective:  Feels good. No complaints. Eager to go home   Vitals:   03/04/21 0801 03/04/21 0915  BP: (!) 89/56   Pulse: 65   Resp: 15   Temp: 97.6 F (36.4 C)   SpO2: 99% 97%   Physical Exam: Cardiac: regular Lungs: non labored, on 3L [place] Incisions:  Left thigh incisions are clean, dry and intact Extremities: well perfused and warm Abdomen:  Obese, soft, non tender Neurologic: alert and oriented  CBC    Component Value Date/Time   WBC 6.4 03/04/2021 0509   RBC 3.07 (L) 03/04/2021 0509   HGB 9.1 (L) 03/04/2021 0509   HCT 28.8 (L) 03/04/2021 0509   PLT 185 03/04/2021 0509   MCV 93.8 03/04/2021 0509   MCH 29.6 03/04/2021 0509   MCHC 31.6 03/04/2021 0509   RDW 15.2 03/04/2021 0509   LYMPHSABS 0.9 03/25/2014 1159   MONOABS 0.5 03/25/2014 1159   EOSABS 0.2 03/25/2014 1159   BASOSABS 0.0 03/25/2014 1159    BMET    Component Value Date/Time   NA 136 03/04/2021 0509   K 4.7 03/04/2021 0509   CL 96 (L) 03/04/2021 0509   CO2 29 03/04/2021 0509   GLUCOSE 130 (H) 03/04/2021 0509   BUN 23 (H) 03/04/2021 0509   CREATININE 6.28 (H) 03/04/2021 0509   CALCIUM 7.5 (L) 03/04/2021 0509   GFRNONAA 7 (L) 03/04/2021 0509   GFRAA 11 (L) 04/10/2016 1620    INR    Component Value Date/Time   INR 1.2 03/03/2021 1817     Intake/Output Summary (Last 24 hours) at 03/04/2021 1019 Last data filed at 03/03/2021 1810 Gross per 24 hour  Intake 900 ml  Output 150 ml  Net 750 ml     Assessment/Plan:  60 y.o. female is s/p Revision left thigh graft with 6 mm interposition PTFE, removal of lateral segment thigh graft 1 Day Post-Op. She was kept overnight in observation due to apneic episodes in PACU and minimally responsive. Appreciate CCM assistance.  She was continued on BiPAP overnight. Midodrine ordered due to hypotension. Patient doing much better back to baseline this morning. Will wean her off of [place]. If she tolerates she  is otherwise stable for discharge home today. She dialyzes Tues/ Thus/ Sat so she will have outpatient dialysis when she leaves hospital today. She has follow up arranged in 2 weeks in our office   Karoline Caldwell, [facility] [phone] 03/04/2021 10:19 AM   VASCULAR STAFF ADDENDUM: I have independently interviewed and examined the patient. I agree with the above.  Wants to go home - DC with outpatient HD.  Yevonne Aline. Stanford Breed, MD [facility] Phone Number: [phone] 03/04/2021 11:29 AM

## 2021-03-04 NOTE — Anesthesia Postprocedure Evaluation (Signed)
Anesthesia Post Note  Patient: [name]  Procedure(s) Performed: REVISION OF LEFT THIGH ARTERIOVENOUS GORETEX GRAFT (Left Thigh)     Patient location during evaluation: PACU Anesthesia Type: General Level of consciousness: awake and alert Pain management: pain level controlled Vital Signs Assessment: post-procedure vital signs reviewed and stable Respiratory status: spontaneous breathing, nonlabored ventilation, respiratory function stable and patient connected to nasal cannula oxygen Cardiovascular status: blood pressure returned to baseline and stable Postop Assessment: no apparent nausea or vomiting Anesthetic complications: no Comments: Pt with prolonged intubation following surgery. Weaned from vent in PACU and extubate without event. Pt with Pickwickian type physiology with poor respiratory effort and drive despite narcotic and benzo reversal. Pt compliant with CPAP but felt not safe to go home and will be admitted after discussion with Dr. Oneida Alar to step down unit for continuous pulse ox and CPAP.  Deatra Canter, MD   No complications documented.  Last Vitals:  Vitals:   03/04/21 0801 03/04/21 0915  BP: (!) 89/56   Pulse: 65   Resp: 15   Temp: 36.4 C   SpO2: 99% 97%    Last Pain:  Vitals:   03/04/21 0801  TempSrc: Oral  PainSc:                  [name]

## 2021-03-04 NOTE — Progress Notes (Signed)
Called to bedside. Patient minimally responsive, SpO2 in 70s. Good response in SpO2 to BiPAP. Still somnolent. Will awaken briefly to stimulation, but quickly falls back asleep. Reports no chest pain, dyspnea, N/V. Appears to be residual effect of anesthesia with severe OSA. Will transfer to ICU and continue BiPAP. PCCM consulted.   [name]. Stanford Breed, MD [facility] Phone Number: [phone] 03/04/2021 2:30 AM

## 2021-03-04 NOTE — Discharge Summary (Signed)
Discharge Summary  Patient ID: [name] [id number] 59 y.o. 11-Jul-1961  Admit date: 03/03/2021  Discharge date and time: 03/04/2021 11:37 AM   Admitting Physician: Cherre Robins, MD   Discharge Physician: Jamelle Haring, MD  Admission Diagnoses: ESRD (end stage renal disease) on dialysis [facility]) [N18.6, Z99.2] Arteriovenous graft removed [Z98.890]  Discharge Diagnoses: ESRD Acute hypoxemic respiratory failure hypotension  Admission Condition: fair  Discharged Condition: fair  Indication for Admission: 60 year old female with ESRD who presented with left thigh AV graft degeneration with wound of the lateral segment needing revision of the graft and excision of the involved segment of graft and wound  Hospital Course: 60 year old female was admitted to the hospital on 03/03/25 and underwent Revision left thigh graft with 6 mm interposition PTFE, removal of lateral segment thigh graft by Dr. Oneida Alar. She tolerated the procedure well but did not wake enough in OR to be extubated. She was extubated in PACU. Very minimally responsive with apneic episodes following. Placed on CPAP. Not in any distress. If was felt safest to admit her for overnight observation  Overnight Dr. Stanford Breed was called to bedside. Patient minimally responsive, SpO2 in 70s. Good response in SpO2 to BiPAP. Still somnolent. Will awaken briefly to stimulation, but quickly falls back asleep. Reports no chest pain, dyspnea, N/V. Appears to be residual effect of anesthesia with severe OSA. PCCM consulted.   PCCM evaluated patient and recommended BiPAP, close monitoring of hypotension, and restarting home Midodrine. Did not feel that she needed transfer to ICU.  She otherwise remained stable overnight. Oxygenation and mentation improved. Able to come off of BiPAP and was on 5L nasal cannula. Felt better and eager to go home. Able to wean her off of [place] with good saturation on room air. Left thigh incisions intact and dry.  She dialyzes Tues/ Thurs/ Sat so she arranged to discharge and go to outpatient dialysis. She will follow up in 2 weeks in the office  Consults: pulmonary/intensive care  Treatments: cardiac meds: midodrine, respiratory therapy: O2 and BiPAP and surgery:Revision left thigh graft with 6 mm interposition PTFE, removal of lateral segment thigh graft    Disposition: Discharge disposition: 01-Home or Self Care       Patient Instructions:  Allergies as of 03/04/2021      Reactions   Amoxicillin Anaphylaxis, Other (See Comments)   Throat closes and gets sweaty.   Penicillins Anaphylaxis   Throat closes and gets sweaty   Iohexol Other (See Comments)    Desc: scratchy/itchy throat/itching on back / nausea   Ceftazidime Nausea And Vomiting      Medication List    STOP taking these medications   traMADol 50 MG tablet Commonly known as: ULTRAM     TAKE these medications   acetaminophen 500 MG tablet Commonly known as: TYLENOL Take 500 mg by mouth every 6 (six) hours as needed for moderate pain or headache.   Atrovent HFA 17 MCG/ACT inhaler Generic drug: ipratropium Inhale 1 puff into the lungs every 6 (six) hours as needed for wheezing.   brimonidine 0.2 % ophthalmic solution Commonly known as: ALPHAGAN Place 1 drop into both eyes 3 (three) times daily. Notes to patient: You did not receive this medication during your hospital stay. You may resume it after discharge.   dorzolamide-timolol 22.3-6.8 MG/ML ophthalmic solution Commonly known as: COSOPT Place 1 drop into both eyes 2 (two) times daily. Notes to patient: You did not receive this medication during your hospital stay.  You may resume it after discharge.   doxycycline 100 MG tablet Commonly known as: VIBRA-TABS Take 100 mg by mouth 2 (two) times daily. Notes to patient: You did not receive this medication during your hospital stay. You may resume it after discharge.   ferric citrate 1 GM 210 MG(Fe) tablet Commonly  known as: AURYXIA Take 630 mg by mouth 3 (three) times daily with meals. Notes to patient: You did not receive this medication during your hospital stay. You may resume it after discharge.   Fluticasone-Salmeterol 100-50 MCG/DOSE Aepb Commonly known as: ADVAIR Inhale 1 puff into the lungs 2 (two) times daily as needed (shortness of breath).   insulin aspart 100 UNIT/ML injection Commonly known as: novoLOG Inject 10-25 Units into the skin 2 (two) times daily. Per sliding scale  Takes in am and evening meal Notes to patient: You did not receive this medication during your hospital stay. You may resume it after discharge.   IRON PO Take 1 tablet by mouth daily. Notes to patient: You did not receive this medication during your hospital stay. You may resume it after discharge.   latanoprost 0.005 % ophthalmic solution Commonly known as: XALATAN Place 1 drop into both eyes at bedtime. Notes to patient: You did not receive this medication during your hospital stay. You may resume it after discharge.   metoCLOPramide 5 MG tablet Commonly known as: REGLAN Take 5 mg by mouth at bedtime. Notes to patient: You did not receive this medication during your hospital stay. You may resume it after discharge.   midodrine 10 MG tablet Commonly known as: PROAMATINE Take 1 tablet (10 mg total) by mouth 3 (three) times daily with meals. What changed: when to take this   multivitamin with minerals Tabs tablet Take 1 tablet by mouth daily. Notes to patient: You did not receive this medication during your hospital stay. You may resume it after discharge.   oxyCODONE-acetaminophen 5-325 MG tablet Commonly known as: Percocet Take 1 tablet by mouth every 4 (four) hours as needed for severe pain.      Activity: activity as tolerated Diet: renal diet Wound Care: keep wound clean and dry. Instructions provided to patient to not stick graft between 3 and 6 o'clock position of graft  Follow-up with Dr.  Oneida Alar in 2 weeks.  SignedKaroline Caldwell, PA-C 03/05/2021 10:11 AM [facility] Office: [phone]

## 2021-03-06 ENCOUNTER — Encounter (HOSPITAL_COMMUNITY): Payer: Self-pay | Admitting: Vascular Surgery

## 2021-03-06 DIAGNOSIS — R52 Pain, unspecified: Secondary | ICD-10-CM | POA: Insufficient documentation

## 2021-03-06 LAB — AEROBIC CULTURE W GRAM STAIN (SUPERFICIAL SPECIMEN)
Culture: NO GROWTH
Gram Stain: NONE SEEN

## 2021-03-15 ENCOUNTER — Ambulatory Visit (INDEPENDENT_AMBULATORY_CARE_PROVIDER_SITE_OTHER): Payer: Medicare Other | Admitting: Physician Assistant

## 2021-03-15 ENCOUNTER — Other Ambulatory Visit: Payer: Self-pay

## 2021-03-15 ENCOUNTER — Encounter: Payer: Self-pay | Admitting: Physician Assistant

## 2021-03-15 VITALS — BP 126/72 | HR 74 | Temp 98.1°F | Resp 20 | Ht 61.0 in | Wt 253.5 lb

## 2021-03-15 DIAGNOSIS — N186 End stage renal disease: Secondary | ICD-10-CM

## 2021-03-15 DIAGNOSIS — Z992 Dependence on renal dialysis: Secondary | ICD-10-CM

## 2021-03-15 NOTE — Progress Notes (Signed)
    Postoperative Access Visit   History of Present Illness   [name] is a 60 y.o. year old female who presents for postoperative follow-up [id number] left thigh graft with 6 mm interposition PTFE, removal of lateral segment thigh graft 03/03/21 by Dr. Oneida Alar. This was performed due to degeneration of lateral segment of left thigh graft with ulceration. She was kept in overnight observation post operatively due to some apneic episodes post extubation and some delayed responsiveness. She did well overnight and remained stable for discharge home.  The patient's wounds are healing well.  The patient notes no steal symptoms. She states the graft has been working well since she was discharged on 3/26, however yesterday she had a different tech and they were not able to cannulate the graft. She is scheduled to go back today to try again. She reports no bleeding issues.  She currently dialyzes on Tues/Thurs/Sat at the [facility] location  Physical Examination   Vitals:   03/15/21 0846  BP: 126/72  Pulse: 74  Resp: 20  Temp: 98.1 F (36.7 C)  TempSrc: Temporal  SpO2: 96%  Weight: 253 lb 8 oz (115 kg)  Height: '5\' 1"'$  (1.549 m)   Body mass index is 47.9 kg/m.  left thigh Incisions are healing well, palpable thrill, bruit can be auscultated     Medical Decision Making   [name] is a 60 y.o. year old female who presents s/p Revision left thigh graft with 6 mm interposition PTFE, removal of lateral segment thigh graft 03/03/21 by Dr. Oneida Alar. This was performed due to degeneration of lateral segment of left thigh graft with ulceration. The incisions are healing well. Clinically the graft is functioning well with good thrill and audible bruit. The graft can continued to be cannulated avoiding the 3- 6 o'clock position for total of 4 weeks (can be used any time after 04/03/21).    Patent is without signs or symptoms of steal syndrome  Advised her to call for earlier follow  up if she continues to have issues with graft at dialysis  The patient may follow up on a prn basis   Karoline Caldwell, PA-C [facility] [place] Office: [facility] MD: Laqueta Due

## 2021-03-16 ENCOUNTER — Telehealth: Payer: Self-pay

## 2021-03-16 ENCOUNTER — Ambulatory Visit (INDEPENDENT_AMBULATORY_CARE_PROVIDER_SITE_OTHER): Payer: Medicare Other | Admitting: Physician Assistant

## 2021-03-16 ENCOUNTER — Encounter: Payer: Self-pay | Admitting: Physician Assistant

## 2021-03-16 VITALS — BP 94/52 | HR 68 | Temp 98.0°F | Resp 20 | Ht 61.0 in | Wt 253.0 lb

## 2021-03-16 DIAGNOSIS — Z992 Dependence on renal dialysis: Secondary | ICD-10-CM

## 2021-03-16 DIAGNOSIS — N186 End stage renal disease: Secondary | ICD-10-CM

## 2021-03-16 NOTE — Progress Notes (Signed)
POST OPERATIVE OFFICE NOTE    CC:  F/u for surgery  HPI:  This is a 60 y.o. female who is s/p  Revision left thigh graft with 6 mm interposition PTFE, removal of lateral segment thigh graft 03/03/21 by Dr. Oneida Alar.   She was last seen in our office on 03/17/21.  The incisions were healing well and she had a palpable thrill in the graft.  Pt returns today for follow up.  Pt states she did have HD and once they were able to cannulate they had no problem.    Allergies  Allergen Reactions  . Amoxicillin Anaphylaxis and Other (See Comments)    Throat closes and gets sweaty.  . Penicillins Anaphylaxis    Throat closes and gets sweaty  . Iohexol Other (See Comments)     Desc: scratchy/itchy throat/itching on back / nausea   . Ceftazidime Nausea And Vomiting    Current Outpatient Medications  Medication Sig Dispense Refill  . acetaminophen (TYLENOL) 500 MG tablet Take 500 mg by mouth every 6 (six) hours as needed for moderate pain or headache.    . ATROVENT HFA 17 MCG/ACT inhaler Inhale 1 puff into the lungs every 6 (six) hours as needed for wheezing.  0  . brimonidine (ALPHAGAN) 0.2 % ophthalmic solution Place 1 drop into both eyes 3 (three) times daily.    . dorzolamide-timolol (COSOPT) 22.3-6.8 MG/ML ophthalmic solution Place 1 drop into both eyes 2 (two) times daily.    Marland Kitchen doxycycline (VIBRA-TABS) 100 MG tablet Take 100 mg by mouth 2 (two) times daily.    . ferric citrate (AURYXIA) 1 GM 210 MG(Fe) tablet Take 630 mg by mouth 3 (three) times daily with meals.    . Ferrous Sulfate (IRON PO) Take 1 tablet by mouth daily.    . Fluticasone-Salmeterol (ADVAIR) 100-50 MCG/DOSE AEPB Inhale 1 puff into the lungs 2 (two) times daily as needed (shortness of breath).    . insulin aspart (NOVOLOG) 100 UNIT/ML injection Inject 10-25 Units into the skin 2 (two) times daily. Per sliding scale  Takes in am and evening meal    . latanoprost (XALATAN) 0.005 % ophthalmic solution Place 1 drop into both eyes  at bedtime.    . metoCLOPramide (REGLAN) 5 MG tablet Take 5 mg by mouth at bedtime.    . midodrine (PROAMATINE) 10 MG tablet Take 1 tablet (10 mg total) by mouth 3 (three) times daily with meals. (Patient taking differently: Take 10 mg by mouth in the morning and at bedtime.) 90 tablet 0  . Multiple Vitamin (MULTIVITAMIN WITH MINERALS) TABS tablet Take 1 tablet by mouth daily.    Marland Kitchen oxyCODONE-acetaminophen (PERCOCET) 5-325 MG tablet Take 1 tablet by mouth every 4 (four) hours as needed for severe pain. 12 tablet 0  . traMADol (ULTRAM) 50 MG tablet Take 50 mg by mouth 3 (three) times daily.     No current facility-administered medications for this visit.     ROS:  See HPI  Physical Exam:    Incision:  Incisions are healing well Extremities:  Palpable pedal pulse, palpable thrill ingraft with audible doppler    Assessment/Plan:  This is a 60 y.o. female who is s/p:Revision left thigh graft with 6 mm interposition PTFE, removal of lateral segment thigh graft 03/03/21 by Dr. Oneida Alar. The graft has a palpable and audible thrill.  She will keep the HD to the medial half of the graft until the lateral revision has scared in.  The revision lateral side may  be used as of 04/03/21.  F/U as needed.   Roxy Horseman PA-C [facility] [phone]   Clinic MD:  Oneida Alar

## 2021-03-16 NOTE — Telephone Encounter (Signed)
Patient seen in APP clinic yesterday. Revised AVG on 3/25 had audible bruit and palpable thrill yesterday - patient reports HD having some trouble accessing it. Patient called today to report loss of thrill at site. Says "she thinks dialysis may have clotted it off with all their digging around". Called [facility] - tech who accessed site today said they had some trouble sticking it and pulled out some clots but no other issues. She was able to receive a treatment. Talked to [facility] - patient placed on PA schedule today (no studies) to evaluate access.

## 2021-04-24 DIAGNOSIS — E8809 Other disorders of plasma-protein metabolism, not elsewhere classified: Secondary | ICD-10-CM | POA: Insufficient documentation

## 2021-07-22 ENCOUNTER — Encounter (HOSPITAL_BASED_OUTPATIENT_CLINIC_OR_DEPARTMENT_OTHER): Payer: Self-pay

## 2021-07-22 ENCOUNTER — Emergency Department (HOSPITAL_BASED_OUTPATIENT_CLINIC_OR_DEPARTMENT_OTHER)
Admission: EM | Admit: 2021-07-22 | Discharge: 2021-07-22 | Disposition: A | Discharge status: Home / Self Care | Payer: Medicare Other | Attending: Emergency Medicine | Admitting: Emergency Medicine

## 2021-07-22 ENCOUNTER — Other Ambulatory Visit: Payer: Self-pay

## 2021-07-22 DIAGNOSIS — Z794 Long term (current) use of insulin: Secondary | ICD-10-CM | POA: Diagnosis not present

## 2021-07-22 DIAGNOSIS — Y840 Cardiac catheterization as the cause of abnormal reaction of the patient, or of later complication, without mention of misadventure at the time of the procedure: Secondary | ICD-10-CM | POA: Insufficient documentation

## 2021-07-22 DIAGNOSIS — T8249XA Other complication of vascular dialysis catheter, initial encounter: Secondary | ICD-10-CM | POA: Insufficient documentation

## 2021-07-22 DIAGNOSIS — E1122 Type 2 diabetes mellitus with diabetic chronic kidney disease: Secondary | ICD-10-CM | POA: Diagnosis not present

## 2021-07-22 DIAGNOSIS — N186 End stage renal disease: Secondary | ICD-10-CM | POA: Insufficient documentation

## 2021-07-22 DIAGNOSIS — T829XXA Unspecified complication of cardiac and vascular prosthetic device, implant and graft, initial encounter: Secondary | ICD-10-CM

## 2021-07-22 DIAGNOSIS — I120 Hypertensive chronic kidney disease with stage 5 chronic kidney disease or end stage renal disease: Secondary | ICD-10-CM | POA: Insufficient documentation

## 2021-07-22 DIAGNOSIS — Z992 Dependence on renal dialysis: Secondary | ICD-10-CM | POA: Diagnosis not present

## 2021-07-22 DIAGNOSIS — Z79899 Other long term (current) drug therapy: Secondary | ICD-10-CM | POA: Diagnosis not present

## 2021-07-22 DIAGNOSIS — J449 Chronic obstructive pulmonary disease, unspecified: Secondary | ICD-10-CM | POA: Diagnosis not present

## 2021-07-22 DIAGNOSIS — M7989 Other specified soft tissue disorders: Secondary | ICD-10-CM | POA: Diagnosis not present

## 2021-07-22 LAB — CBC WITH DIFFERENTIAL/PLATELET
Abs Immature Granulocytes: 0.01 10*3/uL (ref 0.00–0.07)
Basophils Absolute: 0 10*3/uL (ref 0.0–0.1)
Basophils Relative: 0 %
Eosinophils Absolute: 0.3 10*3/uL (ref 0.0–0.5)
Eosinophils Relative: 6 %
HCT: 33.3 % — ABNORMAL LOW (ref 36.0–46.0)
Hemoglobin: 10.6 g/dL — ABNORMAL LOW (ref 12.0–15.0)
Immature Granulocytes: 0 %
Lymphocytes Relative: 12 %
Lymphs Abs: 0.7 10*3/uL (ref 0.7–4.0)
MCH: 29.3 pg (ref 26.0–34.0)
MCHC: 31.8 g/dL (ref 30.0–36.0)
MCV: 92 fL (ref 80.0–100.0)
Monocytes Absolute: 0.6 10*3/uL (ref 0.1–1.0)
Monocytes Relative: 11 %
Neutro Abs: 4.2 10*3/uL (ref 1.7–7.7)
Neutrophils Relative %: 71 %
Platelets: 223 10*3/uL (ref 150–400)
RBC: 3.62 MIL/uL — ABNORMAL LOW (ref 3.87–5.11)
RDW: 14.6 % (ref 11.5–15.5)
WBC: 6 10*3/uL (ref 4.0–10.5)
nRBC: 0 % (ref 0.0–0.2)

## 2021-07-22 LAB — BASIC METABOLIC PANEL
Anion gap: 11 (ref 5–15)
BUN: 9 mg/dL (ref 6–20)
CO2: 33 mmol/L — ABNORMAL HIGH (ref 22–32)
Calcium: 8.4 mg/dL — ABNORMAL LOW (ref 8.9–10.3)
Chloride: 93 mmol/L — ABNORMAL LOW (ref 98–111)
Creatinine, Ser: 3.48 mg/dL — ABNORMAL HIGH (ref 0.44–1.00)
GFR, Estimated: 14 mL/min — ABNORMAL LOW (ref >60)
Glucose, Bld: 100 mg/dL — ABNORMAL HIGH (ref 70–99)
Potassium: 3.7 mmol/L (ref 3.5–5.1)
Sodium: 137 mmol/L (ref 135–145)

## 2021-07-22 NOTE — Discharge Instructions (Addendum)
You likely have a hematoma formation after graft site.  Continue to keep your appointment for vascular lab this coming week.  Return to the ER if you have fevers, worsening symptoms, or any additional concerns.

## 2021-07-22 NOTE — ED Provider Notes (Signed)
[facility] EMERGENCY DEPT Provider Note   CSN: [id number] Arrival date & time: 07/22/21  1410     History Chief Complaint  Patient presents with   Leg Swelling    [name] is a 60 y.o. female.  Patient presents chief complaint of left thigh swelling and pain at the site of her dialysis AV fistula graft.  She states symptoms have been going on for about 2 days.  Denies fall or trauma to her recollection.  She had a graft placed several years ago, last had a revision March 2021.  She otherwise denies any fevers or cough or vomiting or diarrhea.  She states that they have not had any problems utilizing the site for dialysis and last had dialysis today early in the morning.      Past Medical History:  Diagnosis Date   Arthritis    Chronic hypotension    Complication of anesthesia 2016   confusion   COPD (chronic obstructive pulmonary disease) (HCC)    Diabetes mellitus type 2 in obese (HCC)    insulin dependent   ESRD (end stage renal disease) on dialysis ([place])    "TTS; [facility]; (03/25/2014)   GERD (gastroesophageal reflux disease)    Iron deficiency anemia    Obesities, morbid (HCC)    OSA on CPAP    PE (pulmonary embolism) ~ 2010   Pneumonia    "now & once a long time ago" (03/26/2013)   Shortness of breath    "just a little bit; at any time" (03/26/2013)   Superior vena cava syndrome    Archie Endo 03/26/2013    Patient Active Problem List   Diagnosis Date Noted   Pain, unspecified 03/06/2021   ESRD (end stage renal disease) on dialysis ([place]) 03/03/2021   Arteriovenous graft removed 03/03/2021   Unspecified open wound, left thigh, initial encounter 02/23/2021   Other disorders of phosphorus metabolism 11/20/2019   Combined form of age-related cataract, left eye 09/08/2019   Combined form of age-related cataract, right eye 09/08/2019   Optic neuropathy, bilateral 09/08/2019   Primary open angle glaucoma (POAG) of left eye, indeterminate stage  09/08/2019   Primary open angle glaucoma (POAG) of right eye, indeterminate stage 09/08/2019   Fluid overload, unspecified 10/04/2015   Bacteremia due to Staphylococcus    Infection and inflammatory reaction due to cardiac device, implant, and graft ([place])    Staphylococcus aureus bacteremia with sepsis ([place])    Severe sepsis with septic shock ([place])    Screen for STD (sexually transmitted disease)    Pressure ulcer 08/14/2015   Sepsis ([place]) 08/12/2015   SIRS (systemic inflammatory response syndrome) ([place]) 08/12/2015   Diabetes mellitus type 2 in obese ([place]) 08/12/2015   OSA on CPAP 08/12/2015   Chronic hypotension 08/12/2015   COPD (chronic obstructive pulmonary disease) ([place]) 08/12/2015   Blood poisoning    Encounter for removal of sutures 10/23/2014   Coagulation defect, unspecified ([place]) 08/18/2014   Hypercapnia 08/12/2014   Acute pulmonary edema ([place]) 08/12/2014   Acute respiratory failure with hypoxia ([place]) 08/10/2014   COPD with acute exacerbation ([place]) 03/25/2014   Pruritus, unspecified 11/02/2013   Shortness of breath 10/31/2013   Hypercalcemia 07/25/2013   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   Fever and chills 03/26/2013   Cough 03/26/2013   Diarrhea 03/26/2013   ESRD on hemodialysis ([place]) 03/26/2013   OSA (obstructive sleep apnea) 03/26/2013   Anemia of chronic disease 03/26/2013   Secondary hyperparathyroidism ([place]) 03/26/2013   Insulin-requiring or dependent  type II diabetes mellitus ([place]) 03/26/2013   Allergy, unspecified, initial encounter 10/10/2012   Body mass index (BMI) 45.0-49.9, adult ([place]) 07/21/2012   Iron deficiency anemia, unspecified 08/03/2010   Other long term (current) drug therapy 12/09/2009   Radiographic dye allergy status 12/09/2009   Contact with and (suspected) exposure to potentially hazardous body fluids 12/09/2009   Morbid (severe) obesity due to excess calories ([place]) 12/09/2009   Personal history of benign neoplasm of brain  12/09/2009   Personal history of Methicillin resistant Staphylococcus aureus infection 12/09/2009   Personal history of other venous thrombosis and embolism 12/09/2009   Gastro-esophageal reflux disease without esophagitis 11/13/2006   Other specified complication of vascular prosthetic devices, implants and grafts, subsequent encounter 11/13/2006   Effusion, unspecified shoulder 09/07/2006   Carpal tunnel syndrome, unspecified upper limb 12/14/1998   Hypertensive chronic kidney disease with stage 5 chronic kidney disease or end stage renal disease ([place]) 12/14/1998   Renal sclerosis, unspecified 12/14/1998   Status post tubal ligation 12/14/1998   Dependence on renal dialysis ([place]) 12/10/1992    Past Surgical History:  Procedure Laterality Date   ARTERIOVENOUS GRAFT PLACEMENT Left ~ 2011   "thigh" (4   CARPAL TUNNEL RELEASE Right ~ 2011   CARPAL TUNNEL RELEASE Right 01/28/2015   Procedure: RIGHT CARPAL TUNNEL RELEASE;  Surgeon: Roseanne Kaufman, MD;  Location: [facility];  Service: Orthopedics;  Laterality: Right;   PARATHYROIDECTOMY  02/1993   REVISION OF ARTERIOVENOUS GORETEX GRAFT Left 08/14/2015   Procedure: REVISION OF ARTERIOVENOUS GORETEX GRAFT LEFT THIGH with  REMOVAL of segment of infected graft.;  Surgeon: Angelia Mould, MD;  [facility];  Service: Vascular;  Laterality: Left;   REVISION OF ARTERIOVENOUS GORETEX GRAFT Left 03/03/2021   Procedure: REVISION OF LEFT THIGH ARTERIOVENOUS GORETEX GRAFT;  Surgeon: Elam Dutch, MD;  Location: [facility];  Service: Vascular;  Laterality: Left;   THROMBECTOMY AND REVISION OF ARTERIOVENTOUS (AV) GORETEX  GRAFT Left ~ 12/2012   "thigh"    VASCULAR SURGERY       OB History   No obstetric history on file.     Family History  Problem Relation Age of Onset   Diabetes Mellitus II Mother    Hypertension Mother    Hypertension Father    Diabetes Mellitus II Father     Social History   Tobacco Use   Smoking status: Never    Smokeless tobacco: Never  Vaping Use   Vaping Use: Never used  Substance Use Topics   Alcohol use: No   Drug use: No    Home Medications Prior to Admission medications   Medication Sig Start Date End Date Taking? Authorizing Provider  ATROVENT HFA 17 MCG/ACT inhaler Inhale 1 puff into the lungs every 6 (six) hours as needed for wheezing. 01/03/16  Yes [provider]  brimonidine (ALPHAGAN) 0.2 % ophthalmic solution Place 1 drop into both eyes 3 (three) times daily.   Yes [provider]  dorzolamide-timolol (COSOPT) 22.3-6.8 MG/ML ophthalmic solution Place 1 drop into both eyes 2 (two) times daily.   Yes [provider]  ferric citrate (AURYXIA) 1 GM 210 MG(Fe) tablet Take 630 mg by mouth 3 (three) times daily with meals.   Yes [provider]  Ferrous Sulfate (IRON PO) Take 1 tablet by mouth daily.   Yes [provider]  Fluticasone-Salmeterol (ADVAIR) 100-50 MCG/DOSE AEPB Inhale 1 puff into the lungs 2 (two) times daily as needed (shortness of breath).   Yes [provider]  insulin aspart (NOVOLOG) 100 UNIT/ML injection Inject 10-25 Units into the skin 2 (two) times daily. Per sliding scale  Takes in am and evening meal   Yes [provider]  midodrine (PROAMATINE) 10 MG tablet Take 1 tablet (10 mg total) by mouth 3 (three) times daily with meals. Patient taking differently: Take 10 mg by mouth in the morning and at bedtime. 08/18/15  Yes Mikhail, [place], DO  Multiple Vitamin (MULTIVITAMIN WITH MINERALS) TABS tablet Take 1 tablet by mouth daily.   Yes [provider]  traMADol (ULTRAM) 50 MG tablet Take 50 mg by mouth 3 (three) times daily. 03/04/21  Yes [provider]  acetaminophen (TYLENOL) 500 MG tablet Take 500 mg by mouth every 6 (six) hours as needed for moderate pain or headache.    [provider]  doxycycline (VIBRA-TABS) 100 MG tablet Take 100 mg by mouth 2 (two) times daily. 02/27/21    [provider]  latanoprost (XALATAN) 0.005 % ophthalmic solution Place 1 drop into both eyes at bedtime.    [provider]  Methoxy PEG-Epoetin Beta (MIRCERA IJ) Mircera 03/14/21 03/13/22  [provider]  metoCLOPramide (REGLAN) 5 MG tablet Take 5 mg by mouth at bedtime.    [provider]  oxyCODONE-acetaminophen (PERCOCET) 5-325 MG tablet Take 1 tablet by mouth every 4 (four) hours as needed for severe pain. 03/03/21 03/03/22  Baglia, Corrina, PA-C    Allergies    Amoxicillin, Penicillins, Iohexol, and Ceftazidime  Review of Systems   Review of Systems  Constitutional:  Negative for fever.  HENT:  Negative for ear pain.   Eyes:  Negative for pain.  Respiratory:  Negative for cough.   Cardiovascular:  Negative for chest pain.  Gastrointestinal:  Negative for abdominal pain.  Genitourinary:  Negative for flank pain.  Musculoskeletal:  Negative for back pain.  Skin:  Negative for rash.  Neurological:  Negative for headaches.   Physical Exam Updated Vital Signs BP 126/86 (BP Location: Left Wrist)   Pulse 88   Temp 99.2 F (37.3 C)   Resp 16   Ht '5\' 1"'$  (1.549 m)   Wt 126.1 kg   SpO2 95%   BMI 52.53 kg/m   Physical Exam Constitutional:      General: She is not in acute distress.    Appearance: Normal appearance.  HENT:     Head: Normocephalic.     Nose: Nose normal.  Eyes:     Extraocular Movements: Extraocular movements intact.  Cardiovascular:     Rate and Rhythm: Normal rate.  Pulmonary:     Effort: Pulmonary effort is normal.  Musculoskeletal:        General: Normal range of motion.     Cervical back: Normal range of motion.     Comments: Left anterior thigh dialysis graft site appears without ulcerative lesions.  No active bleeding noted.  Palpable thrill is present.  Region of 10 x 20 cm of induration noted but is nonfluctuant and moderately tender on palpation.  No abnormal warmth no cellulitis noted.  No purulent drainage  noted.  Neurological:     General: No focal deficit present.     Mental Status: She is alert. Mental status is at baseline.    ED Results / Procedures / Treatments   Labs (all labs ordered are listed, but only abnormal results are displayed) Labs Reviewed  CBC WITH DIFFERENTIAL/PLATELET - Abnormal; Notable for the following components:      Result Value  RBC 3.62 (*)    Hemoglobin 10.6 (*)    HCT 33.3 (*)    All other components within normal limits  BASIC METABOLIC PANEL - Abnormal; Notable for the following components:   Chloride 93 (*)    CO2 33 (*)    Glucose, Bld 100 (*)    Creatinine, Ser 3.48 (*)    Calcium 8.4 (*)    GFR, Estimated 14 (*)    All other components within normal limits    EKG None  Radiology No results found.  Procedures Procedures   Medications Ordered in ED Medications - No data to display  ED Course  I have reviewed the triage vital signs and the nursing notes.  Pertinent labs & imaging results that were available during my care of the patient were reviewed by me and considered in my medical decision making (see chart for details).    MDM Rules/Calculators/A&P                           Labs are sent normal white count chemistry consistent with end-stage kidney disease.  No arterial ultrasound available at our facility.  I spoke with radiology who recommended that a CT angio would likely not be very helpful or diagnostic.  Case discussed with on-call vascular surgeon, recommends the patient to keep her appointment in 2 days with interventional radiology and to pursue a shuntogram or other diagnostic study that may help.  Patient advised immediate return for increased pain fevers inability to obtain dialysis or if she has any additional concerns.  Final Clinical Impression(s) / ED Diagnoses Final diagnoses:  Complication of dialysis access insertion, initial encounter    Rx / DC Orders ED Discharge Orders     None         Luna Fuse, MD 07/22/21 1730

## 2021-07-22 NOTE — ED Triage Notes (Addendum)
Pt states she has noted new swelling to L thigh area for approximately two weeks. Pt states she had a graft done to the same site for dialysis. Pt is T, Th, Sa dialysis patient and received full tx today.

## 2021-08-09 ENCOUNTER — Ambulatory Visit (INDEPENDENT_AMBULATORY_CARE_PROVIDER_SITE_OTHER): Payer: Medicare Other | Admitting: Physician Assistant

## 2021-08-09 ENCOUNTER — Other Ambulatory Visit: Payer: Self-pay

## 2021-08-09 VITALS — BP 100/58 | HR 69 | Temp 97.7°F | Resp 20 | Ht 61.0 in | Wt 245.1 lb

## 2021-08-09 DIAGNOSIS — Z992 Dependence on renal dialysis: Secondary | ICD-10-CM | POA: Diagnosis not present

## 2021-08-09 DIAGNOSIS — N186 End stage renal disease: Secondary | ICD-10-CM

## 2021-08-09 NOTE — Progress Notes (Signed)
Office Note     CC:  follow up Requesting Provider:  Elwyn Reach, MD  HPI: [name] is a 60 y.o. (1961-04-25) female who presents for evaluation of left thigh graft.  She has had prior access in both arms and also had a right thigh graft that has since thrombosed.  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 Oneida Alar on 03/03/2021.  She describes a infiltration event 6 weeks ago.  She developed a large hematoma however this has softened and decreased in size with the help of a heating pad over the past 6 weeks.  Thigh graft has continued to be functional and is performing well during treatments.  She is here today to make sure she can continue to use her thigh graft and that the hematoma is healing.  She denies rest pain or nonhealing wounds of left foot.  She dialyzes on a Tuesday Thursday Saturday schedule at the [facility] kidney location  Past Medical History:  Diagnosis Date   Arthritis    Chronic hypotension    Complication of anesthesia 2016   confusion   COPD (chronic obstructive pulmonary disease) (HCC)    Diabetes mellitus type 2 in obese (HCC)    insulin dependent   ESRD (end stage renal disease) on dialysis ([place])    "TTS; [facility]; (03/25/2014)   GERD (gastroesophageal reflux disease)    Iron deficiency anemia    Obesities, morbid (HCC)    OSA on CPAP    PE (pulmonary embolism) ~ 2010   Pneumonia    "now & once a long time ago" (03/26/2013)   Shortness of breath    "just a little bit; at any time" (03/26/2013)   Superior vena cava syndrome    Archie Endo 03/26/2013    Past Surgical History:  Procedure Laterality Date   ARTERIOVENOUS GRAFT PLACEMENT Left ~ 2011   "thigh" (4   CARPAL TUNNEL RELEASE Right ~ 2011   CARPAL TUNNEL RELEASE Right 01/28/2015   Procedure: RIGHT CARPAL TUNNEL RELEASE;  Surgeon: Roseanne Kaufman, MD;  Location: [place];  Service: Orthopedics;  Laterality: Right;    PARATHYROIDECTOMY  02/1993   REVISION OF ARTERIOVENOUS GORETEX GRAFT Left 08/14/2015   Procedure: REVISION OF ARTERIOVENOUS GORETEX GRAFT LEFT THIGH with  REMOVAL of segment of infected graft.;  Surgeon: Angelia Mould, MD;  Location: [facility] OR;  Service: Vascular;  Laterality: Left;   REVISION OF ARTERIOVENOUS GORETEX GRAFT Left 03/03/2021   Procedure: REVISION OF LEFT THIGH ARTERIOVENOUS GORETEX GRAFT;  Surgeon: Elam Dutch, MD;  Location: [facility] OR;  Service: Vascular;  Laterality: Left;   THROMBECTOMY AND REVISION OF ARTERIOVENTOUS (AV) GORETEX  GRAFT Left ~ 12/2012   "thigh"    VASCULAR SURGERY      Social History   Socioeconomic History   Marital status: Married    Spouse name: Not on file   Number of children: Not on file   Years of education: Not on file   Highest education level: Not on file  Occupational History   Not on file  Tobacco Use   Smoking status: Never   Smokeless tobacco: Never  Vaping Use   Vaping Use: Never used  Substance and Sexual Activity   Alcohol use: No   Drug use: No   Sexual activity: Never  Other Topics Concern   Not on file  Social History Narrative   Not on file   Social Determinants of Health  Financial Resource Strain: Not on file  Food Insecurity: Not on file  Transportation Needs: Not on file  Physical Activity: Not on file  Stress: Not on file  Social Connections: Not on file  Intimate Partner Violence: Not on file    Family History  Problem Relation Age of Onset   Diabetes Mellitus II Mother    Hypertension Mother    Hypertension Father    Diabetes Mellitus II Father     Current Outpatient Medications  Medication Sig Dispense Refill   acetaminophen (TYLENOL) 500 MG tablet Take 500 mg by mouth every 6 (six) hours as needed for moderate pain or headache.     Alcohol Swabs (ALCOHOL PADS) 70 % PADS SMARTSIG:2 Topical Twice Daily     ATROVENT HFA 17 MCG/ACT inhaler Inhale 1 puff into the lungs every 6 (six) hours as needed  for wheezing.  0   brimonidine (ALPHAGAN) 0.2 % ophthalmic solution Place 1 drop into both eyes 3 (three) times daily.     DIPHENHYDRAMINE HCL PO Take by mouth.     dorzolamide-timolol (COSOPT) 22.3-6.8 MG/ML ophthalmic solution Place 1 drop into both eyes 2 (two) times daily.     doxycycline (VIBRA-TABS) 100 MG tablet Take 100 mg by mouth 2 (two) times daily.     EASY COMFORT INSULIN SYRINGE 31G X 5/16" 1 ML MISC      Easy Comfort Lancets MISC      ferric citrate (AURYXIA) 1 GM 210 MG(Fe) tablet Take 630 mg by mouth 3 (three) times daily with meals.     Ferrous Sulfate (IRON PO) Take 1 tablet by mouth daily.     Fluticasone-Salmeterol (ADVAIR) 100-50 MCG/DOSE AEPB Inhale 1 puff into the lungs 2 (two) times daily as needed (shortness of breath).     heparin 1000 unit/mL SOLN injection Heparin Sodium (Porcine) 1,000 Units/mL Systemic     insulin aspart (NOVOLOG) 100 UNIT/ML injection Inject 10-25 Units into the skin 2 (two) times daily. Per sliding scale  Takes in am and evening meal     latanoprost (XALATAN) 0.005 % ophthalmic solution Place 1 drop into both eyes at bedtime.     meloxicam (MOBIC) 15 MG tablet Take 15 mg by mouth daily.     Methoxy PEG-Epoetin Beta (MIRCERA IJ) Mircera     metoCLOPramide (REGLAN) 5 MG tablet Take 5 mg by mouth at bedtime.     midodrine (PROAMATINE) 10 MG tablet Take 1 tablet (10 mg total) by mouth 3 (three) times daily with meals. (Patient taking differently: Take 10 mg by mouth in the morning and at bedtime.) 90 tablet 0   Multiple Vitamin (MULTIVITAMIN WITH MINERALS) TABS tablet Take 1 tablet by mouth daily.     ONETOUCH VERIO test strip 1 each 4 (four) times daily.     oxyCODONE-acetaminophen (PERCOCET) 5-325 MG tablet Take 1 tablet by mouth every 4 (four) hours as needed for severe pain. 12 tablet 0   predniSONE (DELTASONE) 20 MG tablet Take by mouth.     traMADol (ULTRAM) 50 MG tablet Take 50 mg by mouth 3 (three) times daily.     No current  facility-administered medications for this visit.    Allergies  Allergen Reactions   Amoxicillin Anaphylaxis and Other (See Comments)    Throat closes and gets sweaty.   Penicillins Anaphylaxis    Throat closes and gets sweaty   Iohexol Other (See Comments)     Desc: scratchy/itchy throat/itching on back / nausea    Ceftazidime Nausea And  Vomiting     REVIEW OF SYSTEMS:   '[X]'$  denotes positive finding, '[ ]'$  denotes negative finding Cardiac  Comments:  Chest pain or chest pressure:    Shortness of breath upon exertion:    Short of breath when lying flat:    Irregular heart rhythm:        Vascular    Pain in calf, thigh, or hip brought on by ambulation:    Pain in feet at night that wakes you up from your sleep:     Blood clot in your veins:    Leg swelling:         Pulmonary    Oxygen at home:    Productive cough:     Wheezing:         Neurologic    Sudden weakness in arms or legs:     Sudden numbness in arms or legs:     Sudden onset of difficulty speaking or slurred speech:    Temporary loss of vision in one eye:     Problems with dizziness:         Gastrointestinal    Blood in stool:     Vomited blood:         Genitourinary    Burning when urinating:     Blood in urine:        Psychiatric    Major depression:         Hematologic    Bleeding problems:    Problems with blood clotting too easily:        Skin    Rashes or ulcers:        Constitutional    Fever or chills:      PHYSICAL EXAMINATION:  Vitals:   08/09/21 0914  BP: (!) 100/58  Pulse: 69  Resp: 20  Temp: 97.7 F (36.5 C)  TempSrc: Temporal  SpO2: 97%  Weight: 245 lb 1.6 oz (111.2 kg)  Height: '5\' 1"'$  (1.549 m)    General:  WDWN in NAD; vital signs documented above Gait: Not observed HENT: WNL, normocephalic Pulmonary: normal non-labored breathing , without Rales, rhonchi,  wheezing Cardiac: regular HR Abdomen: soft, NT, no masses Skin: without rashes Vascular Exam/Pulses:   Right Left  Radial 2+ (normal) 2+ (normal)  DP absent 2+ (normal)   Extremities: Patent left thigh graft with palpable thrill throughout; hematoma on the inner curve of the lateral portion of the graft without skin compromise or fluctuance Musculoskeletal: no muscle wasting or atrophy  Neurologic: A&O X 3;  No focal weakness or paresthesias are detected Psychiatric:  The pt has Normal affect.   ASSESSMENT/PLAN:: 60 y.o. female here for evaluation of left thigh AV graft after infiltration event 6 weeks ago  -Subjectively hematoma of left thigh after infiltration event is decreasing in size and softening; continue use of heating pad to help speed reabsorption -Skin overlying hematoma is not compromised and I do not feel any areas of fluctuance that would suggest infection -Hematoma is currently not bothersome to the patient.  I advised against incision and drainage as this AV graft has already been revised once and further surgery would increase the risk of infection in a patient who has limited permanent dialysis options -Continue HD via left thigh AV graft; call/return office with any questions or concerns otherwise can follow-up as needed   Dagoberto Ligas, PA-C [facility] [phone]  Clinic MD:   Scot Dock

## 2021-10-11 ENCOUNTER — Emergency Department (HOSPITAL_BASED_OUTPATIENT_CLINIC_OR_DEPARTMENT_OTHER): Payer: Medicare Other

## 2021-10-11 ENCOUNTER — Emergency Department (HOSPITAL_BASED_OUTPATIENT_CLINIC_OR_DEPARTMENT_OTHER)
Admission: EM | Admit: 2021-10-11 | Discharge: 2021-10-11 | Disposition: A | Discharge status: Home / Self Care | Payer: Medicare Other | Attending: Emergency Medicine | Admitting: Emergency Medicine

## 2021-10-11 ENCOUNTER — Encounter (HOSPITAL_BASED_OUTPATIENT_CLINIC_OR_DEPARTMENT_OTHER): Payer: Self-pay

## 2021-10-11 ENCOUNTER — Other Ambulatory Visit: Payer: Self-pay

## 2021-10-11 DIAGNOSIS — M25562 Pain in left knee: Secondary | ICD-10-CM

## 2021-10-11 DIAGNOSIS — M25462 Effusion, left knee: Secondary | ICD-10-CM | POA: Diagnosis not present

## 2021-10-11 DIAGNOSIS — Z794 Long term (current) use of insulin: Secondary | ICD-10-CM | POA: Diagnosis not present

## 2021-10-11 DIAGNOSIS — J449 Chronic obstructive pulmonary disease, unspecified: Secondary | ICD-10-CM | POA: Insufficient documentation

## 2021-10-11 DIAGNOSIS — N186 End stage renal disease: Secondary | ICD-10-CM | POA: Diagnosis not present

## 2021-10-11 DIAGNOSIS — E1122 Type 2 diabetes mellitus with diabetic chronic kidney disease: Secondary | ICD-10-CM | POA: Diagnosis not present

## 2021-10-11 DIAGNOSIS — Z992 Dependence on renal dialysis: Secondary | ICD-10-CM | POA: Insufficient documentation

## 2021-10-11 MED ORDER — ACETAMINOPHEN 325 MG PO TABS
650.0000 mg | ORAL_TABLET | Freq: Once | ORAL | Status: AC
  Administered 2021-10-11: 650 mg via ORAL
  Filled 2021-10-11: qty 2

## 2021-10-11 NOTE — ED Triage Notes (Addendum)
Pt arrives POV with 2 day history of left knee pain.  Pt denies any fall or injury to left knee, states it just started hurting.  Has tried Tylenol, Aleve, and Tiger Balm without relief.  Pt has dialysis access in left thigh.

## 2021-10-11 NOTE — ED Provider Notes (Signed)
[facility] Provider Note  CSN: [id number] Arrival date & time: 10/11/21 1009    History Chief Complaint  Patient presents with   Knee Pain    [name] is a 60 y.o. female with history of ESRD on HD TTS (went yesterday) here for 3-4 days of aching pain in L knee, worse with movement and standing. Not improved much with APAP. She has had similar in the past, seen in 2020, but improved without intervention and she never saw Ortho. She denies any new falls or injuries. No history of gout. No fever   Past Medical History:  Diagnosis Date   Arthritis    Chronic hypotension    Complication of anesthesia 2016   confusion   COPD (chronic obstructive pulmonary disease) (HCC)    Diabetes mellitus type 2 in obese (HCC)    insulin dependent   ESRD (end stage renal disease) on dialysis ([place])    "TTS; [facility]; (03/25/2014)   GERD (gastroesophageal reflux disease)    Iron deficiency anemia    Obesities, morbid (HCC)    OSA on CPAP    PE (pulmonary embolism) ~ 2010   Pneumonia    "now & once a long time ago" (03/26/2013)   Shortness of breath    "just a little bit; at any time" (03/26/2013)   Superior vena cava syndrome    Archie Endo 03/26/2013    Past Surgical History:  Procedure Laterality Date   ARTERIOVENOUS GRAFT PLACEMENT Left ~ 2011   "thigh" (4   CARPAL TUNNEL RELEASE Right ~ 2011   CARPAL TUNNEL RELEASE Right 01/28/2015   Procedure: RIGHT CARPAL TUNNEL RELEASE;  Surgeon: Roseanne Kaufman, MD;  Location: [place];  Service: Orthopedics;  Laterality: Right;   PARATHYROIDECTOMY  02/1993   REVISION OF ARTERIOVENOUS GORETEX GRAFT Left 08/14/2015   Procedure: REVISION OF ARTERIOVENOUS GORETEX GRAFT LEFT THIGH with  REMOVAL of segment of infected graft.;  Surgeon: Angelia Mould, MD;  Location: [facility] OR;  Service: Vascular;  Laterality: Left;   REVISION OF ARTERIOVENOUS GORETEX GRAFT Left 03/03/2021   Procedure: REVISION OF LEFT THIGH ARTERIOVENOUS  GORETEX GRAFT;  Surgeon: Elam Dutch, MD;  [facility]: [facility] OR;  Service: Vascular;  Laterality: Left;   THROMBECTOMY AND REVISION OF ARTERIOVENTOUS (AV) GORETEX  GRAFT Left ~ 12/2012   "thigh"    VASCULAR SURGERY      Family History  Problem Relation Age of Onset   Diabetes Mellitus II Mother    Hypertension Mother    Hypertension Father    Diabetes Mellitus II Father     Social History   Tobacco Use   Smoking status: Never   Smokeless tobacco: Never  Vaping Use   Vaping Use: Never used  Substance Use Topics   Alcohol use: No   Drug use: No     Home Medications Prior to Admission medications   Medication Sig Start Date End Date Taking? Authorizing Provider  acetaminophen (TYLENOL) 500 MG tablet Take 500 mg by mouth every 6 (six) hours as needed for moderate pain or headache.   Yes [provider]  Alcohol Swabs (ALCOHOL PADS) 70 % PADS SMARTSIG:2 Topical Twice Daily 04/14/21  Yes [provider]  ATROVENT HFA 17 MCG/ACT inhaler Inhale 1 puff into the lungs every 6 (six) hours as needed for wheezing. 01/03/16  Yes [provider]  brimonidine (ALPHAGAN) 0.2 % ophthalmic solution Place 1 drop into both eyes 3 (three) times daily.   Yes [provider]  dorzolamide-timolol (COSOPT) 22.3-6.8 MG/ML ophthalmic solution Place 1 drop into both eyes 2 (two) times daily.   Yes [provider]  insulin aspart (NOVOLOG) 100 UNIT/ML injection Inject 10-25 Units into the skin 2 (two) times daily. Per sliding scale  Takes in am and evening meal   Yes [provider]  latanoprost (XALATAN) 0.005 % ophthalmic solution Place 1 drop into both eyes at bedtime.   Yes [provider]  Methoxy PEG-Epoetin Beta (MIRCERA IJ) Mircera 03/14/21 03/13/22 Yes [provider]  metoCLOPramide (REGLAN) 5 MG tablet Take 5 mg by mouth at bedtime.   Yes [provider]  Multiple Vitamin (MULTIVITAMIN WITH MINERALS) TABS tablet Take 1  tablet by mouth daily.   Yes [provider]  [facility] VERIO test strip 1 each 4 (four) times daily. 04/14/21  Yes [provider]  traMADol (ULTRAM) 50 MG tablet Take 50 mg by mouth 3 (three) times daily. 03/04/21  Yes [provider]  DIPHENHYDRAMINE HCL PO Take by mouth. 06/24/21 06/23/22  [provider]  doxycycline (VIBRA-TABS) 100 MG tablet Take 100 mg by mouth 2 (two) times daily. 02/27/21   [provider]  EASY COMFORT INSULIN SYRINGE 31G X 5/16" 1 ML [place]  04/14/21   [provider]  Easy Comfort Lancets [place]  04/14/21   [provider]  ferric citrate (AURYXIA) 1 GM 210 MG(Fe) tablet Take 630 mg by mouth 3 (three) times daily with meals.    [provider]  Ferrous Sulfate (IRON PO) Take 1 tablet by mouth daily.    [provider]  Fluticasone-Salmeterol (ADVAIR) 100-50 MCG/DOSE AEPB Inhale 1 puff into the lungs 2 (two) times daily as needed (shortness of breath).    [provider]  heparin 1000 unit/mL SOLN injection Heparin Sodium (Porcine) 1,000 Units/mL Systemic 08/03/21 08/02/22  [provider]  meloxicam (MOBIC) 15 MG tablet Take 15 mg by mouth daily. Patient not taking: Reported on 10/11/2021 07/20/21   [provider]  midodrine (PROAMATINE) 10 MG tablet Take 1 tablet (10 mg total) by mouth 3 (three) times daily with meals. Patient taking differently: Take 10 mg by mouth in the morning and at bedtime. 08/18/15   Mikhail, Velta Addison, DO  oxyCODONE-acetaminophen (PERCOCET) 5-325 MG tablet Take 1 tablet by mouth every 4 (four) hours as needed for severe pain. Patient not taking: Reported on 10/11/2021 03/03/21 03/03/22  Karoline Caldwell, PA-C  predniSONE (DELTASONE) 20 MG tablet Take by mouth. 07/27/21   [provider]     Allergies    Amoxicillin, Penicillins, Iohexol, and Ceftazidime   Review of Systems   Review of Systems A comprehensive review of systems was completed and  negative except as noted in HPI.    Physical Exam BP 120/79 (BP Location: Right Arm)   Pulse 72   Temp 98.5 F (36.9 C) (Oral)   Resp 16   Ht 5\' 1"  (1.549 m)   Wt 84.4 kg   SpO2 97%   BMI 35.14 kg/m   Physical Exam Vitals and nursing note reviewed.  Constitutional:      Appearance: Normal appearance.  HENT:     Head: Normocephalic and atraumatic.     Nose: Nose normal.     Mouth/Throat:     Mouth: Mucous membranes are moist.  Eyes:     Extraocular Movements: Extraocular movements intact.     Conjunctiva/sclera: Conjunctivae normal.  Cardiovascular:     Rate and Rhythm: Normal rate.  Pulmonary:  Effort: Pulmonary effort is normal.     Breath sounds: Normal breath sounds.  Abdominal:     General: Abdomen is flat.     Palpations: Abdomen is soft.     Tenderness: There is no abdominal tenderness.  Musculoskeletal:        General: No swelling. Normal range of motion.     Cervical back: Neck supple.     Comments: Dialysis graft in L femoral area with pulse but no thrill, states it was used yesterday without difficulty; L knee is diffusely tender to palpation, no erythema or warmth, difficult to discern effusion given body habitus  Skin:    General: Skin is warm and dry.  Neurological:     General: No focal deficit present.     Mental Status: She is alert.  Psychiatric:        Mood and Affect: Mood normal.     ED Results / Procedures / Treatments   Labs (all labs ordered are listed, but only abnormal results are displayed) Labs Reviewed - No data to display  EKG None   Radiology DG Knee Complete 4 Views Left  Result Date: 10/11/2021 CLINICAL DATA:  Left knee pain for the past 2 days.  No injury. EXAM: LEFT KNEE - COMPLETE 4+ VIEW COMPARISON:  Left knee x-rays dated July 01, 2019. FINDINGS: No acute fracture or dislocation. Large joint effusion, increased from prior. Joint spaces are preserved. Bone mineralization is normal. Diffuse soft tissue swelling.  IMPRESSION: 1. Large joint effusion. No acute osseous abnormality. Electronically Signed   By: Titus Dubin M.D.   On: 10/11/2021 10:56    Procedures Procedures  Medications Ordered in the ED Medications  acetaminophen (TYLENOL) tablet 650 mg (has no administration in time range)     MDM Rules/Calculators/A&P MDM  Patient with atraumatic L knee pain, no signs of infection.  ED Course  I have reviewed the triage vital signs and the nursing notes.  Pertinent labs & imaging results that were available during my care of the patient were reviewed by me and considered in my medical decision making (see chart for details).  Clinical Course as of 10/11/21 1132  Wed Oct 11, 2021  1123 Xray shows effusion, offered therapeutic arthrocentesis in the ED but patient declines. Will have EMT place in knee brace and plan for outpatient Ortho follow up.  [CS]    Clinical Course User Index [CS] Truddie Hidden, MD    Final Clinical Impression(s) / ED Diagnoses Final diagnoses:  Acute pain of left knee    Rx / DC Orders ED Discharge Orders     None        Truddie Hidden, MD 10/11/21 1132

## 2021-10-11 NOTE — Discharge Instructions (Addendum)
Please continue taking Tylenol as needed for pain. Wear your brace and use you walker for comfort. Follow up with Ortho as listed below.

## 2021-10-13 ENCOUNTER — Other Ambulatory Visit: Payer: Self-pay

## 2021-10-13 ENCOUNTER — Ambulatory Visit (INDEPENDENT_AMBULATORY_CARE_PROVIDER_SITE_OTHER): Payer: Medicare Other | Admitting: Orthopaedic Surgery

## 2021-10-13 DIAGNOSIS — M1712 Unilateral primary osteoarthritis, left knee: Secondary | ICD-10-CM | POA: Diagnosis not present

## 2021-10-13 DIAGNOSIS — M25562 Pain in left knee: Secondary | ICD-10-CM

## 2021-10-13 MED ORDER — TRIAMCINOLONE ACETONIDE 40 MG/ML IJ SUSP
80.0000 mg | INTRAMUSCULAR | Status: AC | PRN
  Administered 2021-10-13: 80 mg via INTRA_ARTICULAR

## 2021-10-13 MED ORDER — LIDOCAINE HCL 1 % IJ SOLN
4.0000 mL | INTRAMUSCULAR | Status: AC | PRN
Start: 2021-10-13 — End: 2021-10-13
  Administered 2021-10-13: 4 mL

## 2021-10-13 NOTE — Progress Notes (Signed)
Chief Complaint: left knee pain     History of Present Illness:    [name] is a 60 y.o. female presents today with 1 week of right knee pain.  She denies any specific injury.  She is currently on dialysis.  She went to the emergency room at which point no aspiration was attempted.  No uric acid was obtained.  She was sent to orthopedics for further evaluation.  Denies any history of gout or pseudogout.  Denies any fevers or chills.  She has been taking Tylenol which does not help much.    Surgical History:   None  PMH/PSH/Family History/Social History/Meds/Allergies:    Past Medical History:  Diagnosis Date  . Arthritis   . Chronic hypotension   . Complication of anesthesia 2016   confusion  . COPD (chronic obstructive pulmonary disease) ([place])   . 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 ESRD (end stage renal disease) on dialysis [facility])    "TTS; [place]; (03/25/2014)  . GERD (gastroesophageal reflux disease)   . Iron deficiency anemia   . Obesities, morbid ([place])   . OSA on CPAP   . PE (pulmonary embolism) ~ 2010  . Pneumonia    "now & once a long time ago" (03/26/2013)  . Shortness of breath    "just a little bit; at any time" (03/26/2013)  . Superior vena cava syndrome    Archie Endo 03/26/2013   Past Surgical History:  Procedure Laterality Date  . ARTERIOVENOUS GRAFT PLACEMENT Left ~ 2011   "thigh" (4  . CARPAL TUNNEL RELEASE Right ~ 2011  . CARPAL TUNNEL RELEASE Right 01/28/2015   Procedure: RIGHT CARPAL TUNNEL RELEASE;  Surgeon: Roseanne Kaufman, MD;  Location: [place];  Service: Orthopedics;  Laterality: Right;  . PARATHYROIDECTOMY  02/1993  . REVISION OF ARTERIOVENOUS GORETEX GRAFT Left 08/14/2015   Procedure: REVISION OF ARTERIOVENOUS GORETEX GRAFT LEFT THIGH with  REMOVAL of segment of infected graft.;  Surgeon: Angelia Mould, MD;  Location: [place];  Service: Vascular;  Laterality: Left;  . REVISION  OF ARTERIOVENOUS GORETEX GRAFT Left 03/03/2021   Procedure: REVISION OF LEFT THIGH ARTERIOVENOUS GORETEX GRAFT;  Surgeon: Elam Dutch, MD;  Location: [place];  Service: Vascular;  Laterality: Left;  . THROMBECTOMY AND REVISION OF ARTERIOVENTOUS (AV) GORETEX  GRAFT Left ~ 12/2012   "thigh"   . VASCULAR SURGERY     Social History   Socioeconomic History  . Marital status: Married    Spouse name: Not on file  . Number of children: Not on file  . Years of education: Not on file  . Highest education level: Not on file  Occupational History  . Not on file  Tobacco Use  . Smoking status: Never  . Smokeless tobacco: Never  Vaping Use  . Vaping Use: Never used  Substance and Sexual Activity  . Alcohol use: No  . Drug use: No  . Sexual activity: Never  Other Topics Concern  . Not on file  Social History Narrative  . Not on file   Social Determinants of Health   Financial Resource Strain: Not on file  Food Insecurity: Not on file  Transportation Needs: Not on file  Physical Activity: Not on file  Stress: Not on file  Social  Connections: Not on file   Family History  Problem Relation Age of Onset  . Diabetes Mellitus II Mother   . Hypertension Mother   . Hypertension Father   . Diabetes Mellitus II Father    Allergies  Allergen Reactions  . Amoxicillin Anaphylaxis and Other (See Comments)    Throat closes and gets sweaty.  . Penicillins Anaphylaxis    Throat closes and gets sweaty  . Iohexol Other (See Comments)     Desc: scratchy/itchy throat/itching on back / nausea   . Ceftazidime Nausea And Vomiting   Current Outpatient Medications  Medication Sig Dispense Refill  . acetaminophen (TYLENOL) 500 MG tablet Take 500 mg by mouth every 6 (six) hours as needed for moderate pain or headache.    . Alcohol Swabs (ALCOHOL PADS) 70 % PADS SMARTSIG:2 Topical Twice Daily    . ATROVENT HFA 17 MCG/ACT inhaler Inhale 1 puff into the lungs every 6 (six) hours as needed for  wheezing.  0  . brimonidine (ALPHAGAN) 0.2 % ophthalmic solution Place 1 drop into both eyes 3 (three) times daily.    Marland Kitchen DIPHENHYDRAMINE HCL PO Take by mouth.    . dorzolamide-timolol (COSOPT) 22.3-6.8 MG/ML ophthalmic solution Place 1 drop into both eyes 2 (two) times daily.    Marland Kitchen doxycycline (VIBRA-TABS) 100 MG tablet Take 100 mg by mouth 2 (two) times daily.    Marland Kitchen EASY COMFORT INSULIN SYRINGE 31G X 5/16" 1 ML MISC     . Easy Comfort Lancets MISC     . ferric citrate (AURYXIA) 1 GM 210 MG(Fe) tablet Take 630 mg by mouth 3 (three) times daily with meals.    . Ferrous Sulfate (IRON PO) Take 1 tablet by mouth daily.    . Fluticasone-Salmeterol (ADVAIR) 100-50 MCG/DOSE AEPB Inhale 1 puff into the lungs 2 (two) times daily as needed (shortness of breath).    . heparin 1000 unit/mL SOLN injection Heparin Sodium (Porcine) 1,000 Units/mL Systemic    . insulin aspart (NOVOLOG) 100 UNIT/ML injection Inject 10-25 Units into the skin 2 (two) times daily. Per sliding scale  Takes in am and evening meal    . latanoprost (XALATAN) 0.005 % ophthalmic solution Place 1 drop into both eyes at bedtime.    . meloxicam (MOBIC) 15 MG tablet Take 15 mg by mouth daily. (Patient not taking: Reported on 10/11/2021)    . Methoxy PEG-Epoetin Beta (MIRCERA IJ) Mircera    . metoCLOPramide (REGLAN) 5 MG tablet Take 5 mg by mouth at bedtime.    . midodrine (PROAMATINE) 10 MG tablet Take 1 tablet (10 mg total) by mouth 3 (three) times daily with meals. (Patient taking differently: Take 10 mg by mouth in the morning and at bedtime.) 90 tablet 0  . Multiple Vitamin (MULTIVITAMIN WITH MINERALS) TABS tablet Take 1 tablet by mouth daily.    Glory Rosebush VERIO test strip 1 each 4 (four) times daily.    Marland Kitchen oxyCODONE-acetaminophen (PERCOCET) 5-325 MG tablet Take 1 tablet by mouth every 4 (four) hours as needed for severe pain. (Patient not taking: Reported on 10/11/2021) 12 tablet 0  . predniSONE (DELTASONE) 20 MG tablet Take by mouth.     . traMADol (ULTRAM) 50 MG tablet Take 50 mg by mouth 3 (three) times daily.     No current facility-administered medications for this visit.   No results found.  Review of Systems:   A ROS was performed including pertinent positives and negatives as documented in the HPI.  Physical Exam :  Constitutional: NAD and appears stated age Neurological: Alert and oriented Psych: Appropriate affect and cooperative There were no vitals taken for this visit.   Comprehensive Musculoskeletal Exam:    She has diffuse pain about the left knee.  She is able to range it from 0-50 without any significant pain.  No redness or erythema.  Trace effusion.  Imaging:   Xray (4 views left knee): Mild tricompartmental osteoarthritis  I personally reviewed and interpreted the radiographs.   Assessment:   60 year old female with left knee pain consistent with possible osteoarthritis flareup versus crystalline arthropathy.  At this time I would recommend a left knee steroid injection.  Plan :    -She will follow-up as needed    Procedure Note  Patient: CLOTEAL ISAACSON             Date of Birth: 1961/02/18           MRN: [id number]             Visit Date: 10/13/2021  Procedures: Visit Diagnoses: No diagnosis found.  Large Joint Inj: L knee on 10/13/2021 2:28 PM Indications: pain Details: 22 G 1.5 in needle, ultrasound-guided anterior approach  Arthrogram: No  Medications: 4 mL lidocaine 1 %; 80 mg triamcinolone acetonide 40 MG/ML Outcome: tolerated well, no immediate complications Procedure, treatment alternatives, risks and benefits explained, specific risks discussed. Consent was given by the patient. Immediately prior to procedure a time out was called to verify the correct patient, procedure, equipment, support staff and site/side marked as required. Patient was prepped and draped in the usual sterile fashion.       I personally saw and evaluated the patient, and participated in the  management and treatment plan.  Vanetta Mulders, MD Attending Physician, Orthopedic Surgery  This document was dictated using Dragon voice recognition software. A reasonable attempt at proof reading has been made to minimize errors.

## 2021-10-31 ENCOUNTER — Ambulatory Visit (HOSPITAL_BASED_OUTPATIENT_CLINIC_OR_DEPARTMENT_OTHER): Payer: Medicare Other | Admitting: Orthopaedic Surgery

## 2022-04-14 ENCOUNTER — Emergency Department (HOSPITAL_BASED_OUTPATIENT_CLINIC_OR_DEPARTMENT_OTHER)
Admission: EM | Admit: 2022-04-14 | Discharge: 2022-04-14 | Disposition: A | Discharge status: Home / Self Care | Payer: Medicare Other | Attending: Emergency Medicine | Admitting: Emergency Medicine

## 2022-04-14 ENCOUNTER — Other Ambulatory Visit: Payer: Self-pay

## 2022-04-14 DIAGNOSIS — Z7901 Long term (current) use of anticoagulants: Secondary | ICD-10-CM | POA: Insufficient documentation

## 2022-04-14 DIAGNOSIS — R059 Cough, unspecified: Secondary | ICD-10-CM | POA: Diagnosis present

## 2022-04-14 DIAGNOSIS — H10023 Other mucopurulent conjunctivitis, bilateral: Secondary | ICD-10-CM | POA: Insufficient documentation

## 2022-04-14 DIAGNOSIS — B349 Viral infection, unspecified: Secondary | ICD-10-CM | POA: Diagnosis not present

## 2022-04-14 DIAGNOSIS — Z20822 Contact with and (suspected) exposure to covid-19: Secondary | ICD-10-CM | POA: Diagnosis not present

## 2022-04-14 DIAGNOSIS — Z794 Long term (current) use of insulin: Secondary | ICD-10-CM | POA: Insufficient documentation

## 2022-04-14 LAB — RESP PANEL BY RT-PCR (FLU A&B, COVID) ARPGX2
Influenza A by PCR: NEGATIVE
Influenza B by PCR: NEGATIVE
SARS Coronavirus 2 by RT PCR: NEGATIVE

## 2022-04-14 LAB — GROUP A STREP BY PCR: Group A Strep by PCR: NOT DETECTED

## 2022-04-14 NOTE — ED Provider Notes (Signed)
?[place] EMERGENCY DEPT ?Provider Note ? ? ?CSN: [id number] ?Arrival date & time: 04/14/22  1523 ? ?  ? ?History ? ?Chief Complaint  ?Patient presents with  ? Flu like symptoms  ? ? ?[name] is a 61 y.o. female with medical history significant for dialysis on Wednesdays, Fridays, Sundays.  Patient presents ED for evaluation of URI.  Patient reports that for the last 1 week she has had cough, nasal congestion, sore throat, body aches, bilateral eye drainage.  Patient denies any fevers, nausea or vomiting, diarrhea or abdominal pain.  Patient reports that she has been vaccinated for the flu and COVID, has been boosted.  Patient states that her niece has an unknown viral illness and she recently was around her. ? ?HPI ? ?  ? ?Home Medications ?Prior to Admission medications   ?Medication Sig Start Date End Date Taking? Authorizing Provider  ?acetaminophen (TYLENOL) 500 MG tablet Take 500 mg by mouth every 6 (six) hours as needed for moderate pain or headache.    [provider]  ?Alcohol Swabs (ALCOHOL PADS) 70 % PADS SMARTSIG:2 Topical Twice Daily 04/14/21   [provider]  ?ATROVENT HFA 17 MCG/ACT inhaler Inhale 1 puff into the lungs every 6 (six) hours as needed for wheezing. 01/03/16   [provider]  ?brimonidine (ALPHAGAN) 0.2 % ophthalmic solution Place 1 drop into both eyes 3 (three) times daily.    [provider]  ?DIPHENHYDRAMINE HCL PO Take by mouth. 06/24/21 06/23/22  [provider]  ?dorzolamide-timolol (COSOPT) 22.3-6.8 MG/ML ophthalmic solution Place 1 drop into both eyes 2 (two) times daily.    [provider]  ?doxycycline (VIBRA-TABS) 100 MG tablet Take 100 mg by mouth 2 (two) times daily. 02/27/21   [provider]  ?EASY COMFORT INSULIN SYRINGE 31G X 5/16" 1 ML MISC  04/14/21   [provider]  ?Easy Comfort Lancets [place]  04/14/21   [provider]  ?ferric citrate (AURYXIA) 1 GM 210 MG(Fe) tablet Take  630 mg by mouth 3 (three) times daily with meals.    [provider]  ?Ferrous Sulfate (IRON PO) Take 1 tablet by mouth daily.    [provider]  ?Fluticasone-Salmeterol (ADVAIR) 100-50 MCG/DOSE AEPB Inhale 1 puff into the lungs 2 (two) times daily as needed (shortness of breath).    [provider]  ?heparin 1000 unit/mL SOLN injection Heparin Sodium (Porcine) 1,000 Units/mL Systemic 08/03/21 08/02/22  [provider]  ?insulin aspart (NOVOLOG) 100 UNIT/ML injection Inject 10-25 Units into the skin 2 (two) times daily. Per sliding scale ? ?Takes in am and evening meal    [provider]  ?latanoprost (XALATAN) 0.005 % ophthalmic solution Place 1 drop into both eyes at bedtime.    [provider]  ?meloxicam (MOBIC) 15 MG tablet Take 15 mg by mouth daily. ?Patient not taking: Reported on 10/11/2021 07/20/21   [provider]  ?metoCLOPramide (REGLAN) 5 MG tablet Take 5 mg by mouth at bedtime.    [provider]  ?midodrine (PROAMATINE) 10 MG tablet Take 1 tablet (10 mg total) by mouth 3 (three) times daily with meals. ?Patient taking differently: Take 10 mg by mouth in the morning and at bedtime. 08/18/15   Cristal Ford, DO  ?Multiple Vitamin (MULTIVITAMIN WITH MINERALS) TABS tablet Take 1 tablet by mouth daily.    [provider]  ?Roma Schanz test strip 1 each 4 (four) times daily. 04/14/21   [provider]  ?predniSONE (  DELTASONE) 20 MG tablet Take by mouth. 07/27/21   [provider]  ?traMADol (ULTRAM) 50 MG tablet Take 50 mg by mouth 3 (three) times daily. 03/04/21   [provider]  ?   ? ?Allergies    ?Amoxicillin, Penicillins, Iohexol, and Ceftazidime   ? ?Review of Systems   ?Review of Systems  ?Constitutional:  Positive for chills. Negative for fever.  ?HENT:  Positive for congestion, postnasal drip, rhinorrhea and sore throat.   ?Eyes:  Positive for discharge and itching.  ?Respiratory:  Positive  for cough. Negative for shortness of breath.   ?Cardiovascular:  Negative for chest pain.  ?Gastrointestinal:  Negative for abdominal pain, diarrhea, nausea and vomiting.  ?All other systems reviewed and are negative. ? ?Physical Exam ?Updated Vital Signs ?BP 110/74 (BP Location: Right Wrist)   Pulse 79   Temp 98.7 ?F (37.1 ?C)   Resp 20   Ht 5\' 1"  (1.549 m)   Wt 103.4 kg   SpO2 95%   BMI 43.08 kg/m?  ?Physical Exam ?Vitals and nursing note reviewed.  ?Constitutional:   ?   General: She is not in acute distress. ?   Appearance: Normal appearance. She is not ill-appearing, toxic-appearing or diaphoretic.  ?HENT:  ?   Head: Normocephalic and atraumatic.  ?   Nose: Congestion present.  ?   Mouth/Throat:  ?   Mouth: Mucous membranes are moist.  ?   Pharynx: Oropharynx is clear. Posterior oropharyngeal erythema present. No oropharyngeal exudate.  ?Eyes:  ?   General:     ?   Right eye: Discharge present.     ?   Left eye: Discharge present. ?   Extraocular Movements: Extraocular movements intact.  ?   Conjunctiva/sclera: Conjunctivae normal.  ?   Pupils: Pupils are equal, round, and reactive to light.  ?   Comments: Bilateral clear eye discharge  ?Cardiovascular:  ?   Rate and Rhythm: Normal rate and regular rhythm.  ?Pulmonary:  ?   Effort: Pulmonary effort is normal.  ?   Breath sounds: Normal breath sounds. No wheezing.  ?Abdominal:  ?   General: Abdomen is flat. Bowel sounds are normal.  ?   Palpations: Abdomen is soft.  ?   Tenderness: There is no abdominal tenderness.  ?Musculoskeletal:  ?   Cervical back: Normal range of motion and neck supple. No tenderness.  ?Skin: ?   General: Skin is warm and dry.  ?   Capillary Refill: Capillary refill takes less than 2 seconds.  ?Neurological:  ?   Mental Status: She is alert and oriented to person, place, and time.  ? ? ?ED Results / Procedures / Treatments   ?Labs ?(all labs ordered are listed, but only abnormal results are displayed) ?Labs Reviewed  ?RESP PANEL BY  RT-PCR (FLU A&B, COVID) ARPGX2  ?GROUP A STREP BY PCR  ? ? ?EKG ?None ? ?Radiology ?No results found. ? ?Procedures ?Procedures  ? ? ?Medications Ordered in ED ?Medications - No data to display ? ?ED Course/ Medical Decision Making/ A&P ?  ?                        ?Medical Decision Making ? ?61 year old female presents to ED for evaluation of URI.  Please see HPI for further details ? ?On examination, patient nontachycardic, afebrile.  Patient lung sounds clear bilaterally, patient nonhypoxic on room air.  Patient abdomen soft compressible in all 4 quadrants.  No lower extremity  swelling or edema.  Patient posterior oropharynx does show signs of erythema however no exudate.  Patient uvula is midline, patient handling secretions appropriately.  Patient has bilateral clear eye discharge. ? ?Patient worked up utilizing the following labs which I personally interpreted: ?- Respiratory panel negative for flu, COVID ?- Patient strep negative ? ?At this time, most likely cause of patient illness due to viral illness.  Patient advised to treat symptomatically.  Patient advised to follow-up with PCP for ongoing needs.  Patient provided with return precautions which she voiced understanding with.  The patient had all of her questions answered to her satisfaction.  The patient is stable for discharge at this time ? ? ?Final Clinical Impression(s) / ED Diagnoses ?Final diagnoses:  ?Viral illness  ? ? ?Rx / DC Orders ?ED Discharge Orders   ? ? None  ? ?  ? ? ?  ?Azucena Cecil, PA-C ?04/14/22 1810 ? ?  ?Dorie Rank, MD ?04/14/22 2122 ? ?

## 2022-04-14 NOTE — ED Triage Notes (Signed)
Pt c/o flu like symptoms, cough, congestion, chills but no fever. Unknown exposure to flu/covid.  ?

## 2022-04-14 NOTE — Discharge Instructions (Signed)
Return to ED with any new symptoms such as inability to swallow, drooling, high fever nonresponsive to Tylenol ?Please follow-up with your PCP for any ongoing needs ?You most likely have a viral illness.  You may take Tylenol for any fevers or body aches or chills.  I encourage you to continue using warm compresses on both of your eyes to increase drainage.  Please remain hydrated, I suggest Pedialyte.  Please continue eating a diet that is agreeable to your stomach. ?Please review the attached informational guide concerning viral illnesses in adults ?

## 2022-06-13 ENCOUNTER — Other Ambulatory Visit: Payer: Self-pay | Admitting: Orthopedic Surgery

## 2022-06-23 ENCOUNTER — Emergency Department (HOSPITAL_COMMUNITY): Payer: Medicare Other

## 2022-06-23 ENCOUNTER — Inpatient Hospital Stay (HOSPITAL_COMMUNITY)
Admission: EM | Admit: 2022-06-23 | Discharge: 2022-06-26 | DRG: 312 | Disposition: A | Discharge status: Home / Self Care | Payer: Medicare Other | Attending: Internal Medicine | Admitting: Internal Medicine

## 2022-06-23 DIAGNOSIS — Z794 Long term (current) use of insulin: Secondary | ICD-10-CM | POA: Diagnosis not present

## 2022-06-23 DIAGNOSIS — Z8249 Family history of ischemic heart disease and other diseases of the circulatory system: Secondary | ICD-10-CM

## 2022-06-23 DIAGNOSIS — E875 Hyperkalemia: Secondary | ICD-10-CM | POA: Diagnosis present

## 2022-06-23 DIAGNOSIS — M1990 Unspecified osteoarthritis, unspecified site: Secondary | ICD-10-CM | POA: Diagnosis present

## 2022-06-23 DIAGNOSIS — Z791 Long term (current) use of non-steroidal anti-inflammatories (NSAID): Secondary | ICD-10-CM

## 2022-06-23 DIAGNOSIS — R739 Hyperglycemia, unspecified: Secondary | ICD-10-CM

## 2022-06-23 DIAGNOSIS — I9589 Other hypotension: Secondary | ICD-10-CM | POA: Diagnosis present

## 2022-06-23 DIAGNOSIS — I953 Hypotension of hemodialysis: Secondary | ICD-10-CM | POA: Diagnosis present

## 2022-06-23 DIAGNOSIS — N186 End stage renal disease: Secondary | ICD-10-CM | POA: Diagnosis present

## 2022-06-23 DIAGNOSIS — Z833 Family history of diabetes mellitus: Secondary | ICD-10-CM | POA: Diagnosis not present

## 2022-06-23 DIAGNOSIS — Z91041 Radiographic dye allergy status: Secondary | ICD-10-CM

## 2022-06-23 DIAGNOSIS — Z992 Dependence on renal dialysis: Secondary | ICD-10-CM

## 2022-06-23 DIAGNOSIS — G4733 Obstructive sleep apnea (adult) (pediatric): Secondary | ICD-10-CM | POA: Diagnosis present

## 2022-06-23 DIAGNOSIS — J449 Chronic obstructive pulmonary disease, unspecified: Secondary | ICD-10-CM | POA: Diagnosis not present

## 2022-06-23 DIAGNOSIS — Z86711 Personal history of pulmonary embolism: Secondary | ICD-10-CM

## 2022-06-23 DIAGNOSIS — R571 Hypovolemic shock: Secondary | ICD-10-CM | POA: Diagnosis not present

## 2022-06-23 DIAGNOSIS — I272 Pulmonary hypertension, unspecified: Secondary | ICD-10-CM | POA: Diagnosis not present

## 2022-06-23 DIAGNOSIS — Z7952 Long term (current) use of systemic steroids: Secondary | ICD-10-CM

## 2022-06-23 DIAGNOSIS — Z881 Allergy status to other antibiotic agents status: Secondary | ICD-10-CM

## 2022-06-23 DIAGNOSIS — Z6841 Body Mass Index (BMI) 40.0 and over, adult: Secondary | ICD-10-CM

## 2022-06-23 DIAGNOSIS — E1165 Type 2 diabetes mellitus with hyperglycemia: Secondary | ICD-10-CM | POA: Diagnosis present

## 2022-06-23 DIAGNOSIS — E1122 Type 2 diabetes mellitus with diabetic chronic kidney disease: Secondary | ICD-10-CM | POA: Diagnosis present

## 2022-06-23 DIAGNOSIS — I120 Hypertensive chronic kidney disease with stage 5 chronic kidney disease or end stage renal disease: Secondary | ICD-10-CM | POA: Diagnosis present

## 2022-06-23 DIAGNOSIS — I959 Hypotension, unspecified: Secondary | ICD-10-CM | POA: Diagnosis present

## 2022-06-23 DIAGNOSIS — E872 Acidosis, unspecified: Secondary | ICD-10-CM | POA: Diagnosis present

## 2022-06-23 DIAGNOSIS — Z79899 Other long term (current) drug therapy: Secondary | ICD-10-CM

## 2022-06-23 DIAGNOSIS — Z7951 Long term (current) use of inhaled steroids: Secondary | ICD-10-CM

## 2022-06-23 DIAGNOSIS — D72829 Elevated white blood cell count, unspecified: Secondary | ICD-10-CM | POA: Diagnosis present

## 2022-06-23 DIAGNOSIS — D631 Anemia in chronic kidney disease: Secondary | ICD-10-CM | POA: Diagnosis present

## 2022-06-23 DIAGNOSIS — K219 Gastro-esophageal reflux disease without esophagitis: Secondary | ICD-10-CM | POA: Diagnosis present

## 2022-06-23 DIAGNOSIS — E871 Hypo-osmolality and hyponatremia: Secondary | ICD-10-CM | POA: Diagnosis present

## 2022-06-23 DIAGNOSIS — Z880 Allergy status to penicillin: Secondary | ICD-10-CM

## 2022-06-23 LAB — CBC WITH DIFFERENTIAL/PLATELET
Abs Immature Granulocytes: 0.05 10*3/uL (ref 0.00–0.07)
Basophils Absolute: 0 10*3/uL (ref 0.0–0.1)
Basophils Relative: 0 %
Eosinophils Absolute: 0.1 10*3/uL (ref 0.0–0.5)
Eosinophils Relative: 1 %
HCT: 40 % (ref 36.0–46.0)
Hemoglobin: 12.9 g/dL (ref 12.0–15.0)
Immature Granulocytes: 1 %
Lymphocytes Relative: 24 %
Lymphs Abs: 2.6 10*3/uL (ref 0.7–4.0)
MCH: 30.3 pg (ref 26.0–34.0)
MCHC: 32.3 g/dL (ref 30.0–36.0)
MCV: 93.9 fL (ref 80.0–100.0)
Monocytes Absolute: 0.8 10*3/uL (ref 0.1–1.0)
Monocytes Relative: 7 %
Neutro Abs: 7.5 10*3/uL (ref 1.7–7.7)
Neutrophils Relative %: 67 %
Platelets: 163 10*3/uL (ref 150–400)
RBC: 4.26 MIL/uL (ref 3.87–5.11)
RDW: 15.7 % — ABNORMAL HIGH (ref 11.5–15.5)
WBC: 11 10*3/uL — ABNORMAL HIGH (ref 4.0–10.5)
nRBC: 0 % (ref 0.0–0.2)

## 2022-06-23 LAB — MRSA NEXT GEN BY PCR, NASAL: MRSA by PCR Next Gen: NOT DETECTED

## 2022-06-23 LAB — I-STAT VENOUS BLOOD GAS, ED
Acid-Base Excess: 5 mmol/L — ABNORMAL HIGH (ref 0.0–2.0)
Bicarbonate: 30.7 mmol/L — ABNORMAL HIGH (ref 20.0–28.0)
Calcium, Ion: 0.72 mmol/L — CL (ref 1.15–1.40)
HCT: 41 % (ref 36.0–46.0)
Hemoglobin: 13.9 g/dL (ref 12.0–15.0)
O2 Saturation: 78 %
Potassium: 4.4 mmol/L (ref 3.5–5.1)
Sodium: 134 mmol/L — ABNORMAL LOW (ref 135–145)
TCO2: 32 mmol/L (ref 22–32)
pCO2, Ven: 47 mmHg (ref 44–60)
pH, Ven: 7.422 (ref 7.25–7.43)
pO2, Ven: 42 mmHg (ref 32–45)

## 2022-06-23 LAB — CBC
HCT: 38.1 % (ref 36.0–46.0)
Hemoglobin: 12.5 g/dL (ref 12.0–15.0)
MCH: 30.8 pg (ref 26.0–34.0)
MCHC: 32.8 g/dL (ref 30.0–36.0)
MCV: 93.8 fL (ref 80.0–100.0)
Platelets: 196 10*3/uL (ref 150–400)
RBC: 4.06 MIL/uL (ref 3.87–5.11)
RDW: 15.7 % — ABNORMAL HIGH (ref 11.5–15.5)
WBC: 16.3 10*3/uL — ABNORMAL HIGH (ref 4.0–10.5)
nRBC: 0 % (ref 0.0–0.2)

## 2022-06-23 LAB — BASIC METABOLIC PANEL
Anion gap: 16 — ABNORMAL HIGH (ref 5–15)
BUN: 29 mg/dL — ABNORMAL HIGH (ref 8–23)
CO2: 27 mmol/L (ref 22–32)
Calcium: 6.6 mg/dL — ABNORMAL LOW (ref 8.9–10.3)
Chloride: 92 mmol/L — ABNORMAL LOW (ref 98–111)
Creatinine, Ser: 5.54 mg/dL — ABNORMAL HIGH (ref 0.44–1.00)
GFR, Estimated: 8 mL/min — ABNORMAL LOW (ref >60)
Glucose, Bld: 306 mg/dL — ABNORMAL HIGH (ref 70–99)
Potassium: 4.5 mmol/L (ref 3.5–5.1)
Sodium: 135 mmol/L (ref 135–145)

## 2022-06-23 LAB — LACTIC ACID, PLASMA
Lactic Acid, Venous: 2.7 mmol/L (ref 0.5–1.9)
Lactic Acid, Venous: 3 mmol/L (ref 0.5–1.9)

## 2022-06-23 LAB — HEMOGLOBIN A1C
Hgb A1c MFr Bld: 8.2 % — ABNORMAL HIGH (ref 4.8–5.6)
Mean Plasma Glucose: 188.64 mg/dL

## 2022-06-23 LAB — CREATININE, SERUM
Creatinine, Ser: 5.67 mg/dL — ABNORMAL HIGH (ref 0.44–1.00)
GFR, Estimated: 8 mL/min — ABNORMAL LOW (ref >60)

## 2022-06-23 LAB — CORTISOL: Cortisol, Plasma: >100 ug/dL

## 2022-06-23 LAB — GLUCOSE, CAPILLARY: Glucose-Capillary: 359 mg/dL — ABNORMAL HIGH (ref 70–99)

## 2022-06-23 LAB — HIV ANTIBODY (ROUTINE TESTING W REFLEX): HIV Screen 4th Generation wRfx: NONREACTIVE

## 2022-06-23 LAB — PROCALCITONIN: Procalcitonin: 0.87 ng/mL

## 2022-06-23 LAB — BRAIN NATRIURETIC PEPTIDE: B Natriuretic Peptide: 44 pg/mL (ref 0.0–100.0)

## 2022-06-23 LAB — PHOSPHORUS: Phosphorus: 4.2 mg/dL (ref 2.5–4.6)

## 2022-06-23 LAB — MAGNESIUM: Magnesium: 1.7 mg/dL (ref 1.7–2.4)

## 2022-06-23 LAB — TROPONIN I (HIGH SENSITIVITY): Troponin I (High Sensitivity): 21 ng/L — ABNORMAL HIGH (ref <18)

## 2022-06-23 MED ORDER — POLYETHYLENE GLYCOL 3350 17 G PO PACK: 17.0000 g | PACK | Freq: Every day | ORAL | Status: DC | PRN

## 2022-06-23 MED ORDER — ACETAMINOPHEN 325 MG PO TABS
650.0000 mg | ORAL_TABLET | ORAL | Status: DC | PRN
  Administered 2022-06-23: 650 mg via ORAL
  Filled 2022-06-23: qty 2

## 2022-06-23 MED ORDER — HYDROCORTISONE SOD SUC (PF) 100 MG IJ SOLR
100.0000 mg | Freq: Once | INTRAMUSCULAR | Status: AC
  Administered 2022-06-23: 100 mg via INTRAVENOUS
  Filled 2022-06-23: qty 2

## 2022-06-23 MED ORDER — PANTOPRAZOLE SODIUM 40 MG PO TBEC
40.0000 mg | DELAYED_RELEASE_TABLET | Freq: Every day | ORAL | Status: DC
  Administered 2022-06-23 – 2022-06-26 (×4): 40 mg via ORAL
  Filled 2022-06-23 (×4): qty 1

## 2022-06-23 MED ORDER — HEPARIN SODIUM (PORCINE) 5000 UNIT/ML IJ SOLN
5000.0000 [IU] | Freq: Three times a day (TID) | INTRAMUSCULAR | Status: DC
  Administered 2022-06-23 – 2022-06-26 (×9): 5000 [IU] via SUBCUTANEOUS
  Filled 2022-06-23 (×8): qty 1

## 2022-06-23 MED ORDER — ORAL CARE MOUTH RINSE: 15.0000 mL | OROMUCOSAL | Status: DC | PRN

## 2022-06-23 MED ORDER — SODIUM CHLORIDE 0.9 % IV BOLUS
500.0000 mL | Freq: Once | INTRAVENOUS | Status: AC
  Administered 2022-06-23: 500 mL via INTRAVENOUS

## 2022-06-23 MED ORDER — DOCUSATE SODIUM 100 MG PO CAPS: 100.0000 mg | ORAL_CAPSULE | Freq: Two times a day (BID) | ORAL | Status: DC | PRN

## 2022-06-23 MED ORDER — EPINEPHRINE HCL 5 MG/250ML IV SOLN IN NS
0.5000 ug/min | INTRAVENOUS | Status: DC
  Administered 2022-06-23: 0.5 ug/min via INTRAVENOUS
  Filled 2022-06-23: qty 250

## 2022-06-23 MED ORDER — SODIUM CHLORIDE 0.9 % IV SOLN
250.0000 mL | INTRAVENOUS | Status: DC
  Administered 2022-06-24: 250 mL via INTRAVENOUS

## 2022-06-23 MED ORDER — INSULIN ASPART 100 UNIT/ML IJ SOLN
0.0000 [IU] | INTRAMUSCULAR | Status: DC
  Administered 2022-06-23: 5 [IU] via SUBCUTANEOUS
  Administered 2022-06-24 (×2): 2 [IU] via SUBCUTANEOUS
  Administered 2022-06-24: 4 [IU] via SUBCUTANEOUS
  Administered 2022-06-24: 1 [IU] via SUBCUTANEOUS
  Administered 2022-06-24 (×2): 3 [IU] via SUBCUTANEOUS
  Administered 2022-06-25: 1 [IU] via SUBCUTANEOUS
  Administered 2022-06-25: 3 [IU] via SUBCUTANEOUS
  Administered 2022-06-25: 2 [IU] via SUBCUTANEOUS
  Administered 2022-06-26: 1 [IU] via SUBCUTANEOUS

## 2022-06-23 MED ORDER — IPRATROPIUM-ALBUTEROL 0.5-2.5 (3) MG/3ML IN SOLN: 3.0000 mL | Freq: Four times a day (QID) | RESPIRATORY_TRACT | Status: DC | PRN

## 2022-06-23 MED ORDER — MOMETASONE FURO-FORMOTEROL FUM 100-5 MCG/ACT IN AERO
2.0000 | INHALATION_SPRAY | Freq: Two times a day (BID) | RESPIRATORY_TRACT | Status: DC
  Administered 2022-06-24 – 2022-06-25 (×4): 2 via RESPIRATORY_TRACT
  Filled 2022-06-23 (×2): qty 8.8

## 2022-06-23 MED ORDER — MIDODRINE HCL 5 MG PO TABS
10.0000 mg | ORAL_TABLET | Freq: Three times a day (TID) | ORAL | Status: DC
  Administered 2022-06-23 – 2022-06-24 (×2): 10 mg via ORAL
  Filled 2022-06-23 (×2): qty 2

## 2022-06-23 MED ORDER — SODIUM CHLORIDE 0.9 % IV BOLUS
250.0000 mL | Freq: Once | INTRAVENOUS | Status: AC
  Administered 2022-06-23: 250 mL via INTRAVENOUS

## 2022-06-23 MED ORDER — CHLORHEXIDINE GLUCONATE CLOTH 2 % EX PADS
6.0000 | MEDICATED_PAD | Freq: Every day | CUTANEOUS | Status: DC
  Administered 2022-06-23 – 2022-06-25 (×2): 6 via TOPICAL

## 2022-06-23 MED ORDER — NOREPINEPHRINE 4 MG/250ML-% IV SOLN
2.0000 ug/min | INTRAVENOUS | Status: DC
  Administered 2022-06-23 – 2022-06-24 (×2): 2 ug/min via INTRAVENOUS
  Administered 2022-06-24: 1 ug/min via INTRAVENOUS
  Filled 2022-06-23 (×2): qty 250

## 2022-06-23 NOTE — ED Notes (Signed)
Pt weaned off Epi drip and fluids started. BP drops once more. Patient remains asymptomatic of hypotension and continues to answer questions. MD Zavitz notified and new Epi drip requested. See MAR. Started at rate similar to what EMS was running.

## 2022-06-23 NOTE — Progress Notes (Signed)
An USGPIV (ultrasound guided PIV) has been placed for short-term vasopressor infusion. A correctly placed ivWatch must be used when administering Vasopressors. Should this treatment be needed beyond 72 hours, central line access should be obtained.  It will be the responsibility of the bedside nurse to follow best practice to prevent extravasations.   ?

## 2022-06-23 NOTE — ED Notes (Signed)
Phlebotomy at bedside to draw cultures.

## 2022-06-23 NOTE — Progress Notes (Signed)
Pt. Refused cpap. RT informed pt. To notify if she changes her mind. 

## 2022-06-23 NOTE — ED Notes (Signed)
Zavitz MD notified of continued hypotension after bolus. EPI now running at 61mcg/min. BP verified in both arms. Pt continues to answer questions and remains alert. Critical care consulted.

## 2022-06-23 NOTE — ED Provider Notes (Signed)
[facility] Provider Note   CSN: [id number] Arrival date & time: 06/23/22  1738     History  Chief Complaint  Patient presents with   Hypotension    [name] is a 61 y.o. female.  Patient presents from dialysis center after completing dialysis and feeling generally weak lightheaded and her blood pressure dropped to the 17C systolic.  Patient received 500 cc on route with EMS and blood pressure remained low and they start epi drip at 4 mics per minute to get her blood pressure to around 90 systolic.  Patient is felt weak after dialysis before however she is persistently feeling generally weak.  Patient denies blood in the stools, chest pain, shortness of breath, fevers.  Patient has mild generalized headache.       Home Medications Prior to Admission medications   Medication Sig Start Date End Date Taking? Authorizing Provider  acetaminophen (TYLENOL) 500 MG tablet Take 500 mg by mouth every 6 (six) hours as needed for moderate pain or headache.    [provider]  Alcohol Swabs (ALCOHOL PADS) 70 % PADS SMARTSIG:2 Topical Twice Daily 04/14/21   [provider]  ATROVENT HFA 17 MCG/ACT inhaler Inhale 1 puff into the lungs every 6 (six) hours as needed for wheezing. 01/03/16   [provider]  brimonidine (ALPHAGAN) 0.2 % ophthalmic solution Place 1 drop into both eyes 3 (three) times daily.    [provider]  DIPHENHYDRAMINE HCL PO Take by mouth. 06/24/21 06/23/22  [provider]  dorzolamide-timolol (COSOPT) 22.3-6.8 MG/ML ophthalmic solution Place 1 drop into both eyes 2 (two) times daily.    [provider]  doxycycline (VIBRA-TABS) 100 MG tablet Take 100 mg by mouth 2 (two) times daily. 02/27/21   [provider]  EASY COMFORT INSULIN SYRINGE 31G X 5/16" 1 ML [place]  04/14/21   [provider]  Easy Comfort Lancets [place]  04/14/21   [provider]  ferric citrate  (AURYXIA) 1 GM 210 MG(Fe) tablet Take 630 mg by mouth 3 (three) times daily with meals.    [provider]  Ferrous Sulfate (IRON PO) Take 1 tablet by mouth daily.    [provider]  Fluticasone-Salmeterol (ADVAIR) 100-50 MCG/DOSE AEPB Inhale 1 puff into the lungs 2 (two) times daily as needed (shortness of breath).    [provider]  heparin 1000 unit/mL SOLN injection Heparin Sodium (Porcine) 1,000 Units/mL Systemic 08/03/21 08/02/22  [provider]  insulin aspart (NOVOLOG) 100 UNIT/ML injection Inject 10-25 Units into the skin 2 (two) times daily. Per sliding scale  Takes in am and evening meal    [provider]  latanoprost (XALATAN) 0.005 % ophthalmic solution Place 1 drop into both eyes at bedtime.    [provider]  meloxicam (MOBIC) 15 MG tablet Take 15 mg by mouth daily. Patient not taking: Reported on 10/11/2021 07/20/21   [provider]  metoCLOPramide (REGLAN) 5 MG tablet Take 5 mg by mouth at bedtime.    [provider]  midodrine (PROAMATINE) 10 MG tablet Take 1 tablet (10 mg total) by mouth 3 (three) times daily with meals. Patient taking differently: Take 10 mg by mouth in the morning and at bedtime. 08/18/15   Cristal Ford, DO  Multiple Vitamin (MULTIVITAMIN WITH MINERALS) TABS tablet Take 1 tablet by mouth daily.    [provider]  [facility] VERIO test strip 1 each 4 (four) times daily. 04/14/21  [provider]  predniSONE (DELTASONE) 20 MG tablet Take by mouth. 07/27/21   [provider]  traMADol (ULTRAM) 50 MG tablet Take 50 mg by mouth 3 (three) times daily. 03/04/21   [provider]      Allergies    Amoxicillin, Penicillins, Iohexol, and Ceftazidime    Review of Systems   Review of Systems  Constitutional:  Negative for chills and fever.  HENT:  Negative for congestion.   Eyes:  Negative for visual disturbance.  Respiratory:  Negative for shortness of  breath.   Cardiovascular:  Negative for chest pain.  Gastrointestinal:  Negative for abdominal pain and vomiting.  Genitourinary:  Negative for dysuria and flank pain.  Musculoskeletal:  Negative for back pain, neck pain and neck stiffness.  Skin:  Negative for rash.  Neurological:  Positive for weakness and light-headedness. Negative for headaches.    Physical Exam Updated Vital Signs BP (!) 86/64   Pulse 96   Temp 98.5 F (36.9 C) (Oral)   Resp 18   SpO2 100%  Physical Exam Vitals and nursing note reviewed.  Constitutional:      General: She is not in acute distress.    Appearance: She is well-developed.  HENT:     Head: Normocephalic and atraumatic.     Mouth/Throat:     Mouth: Mucous membranes are dry.  Eyes:     General:        Right eye: No discharge.        Left eye: No discharge.     Conjunctiva/sclera: Conjunctivae normal.  Neck:     Trachea: No tracheal deviation.  Cardiovascular:     Rate and Rhythm: Normal rate and regular rhythm.  Pulmonary:     Effort: Pulmonary effort is normal.     Breath sounds: Normal breath sounds.  Abdominal:     General: There is no distension.     Palpations: Abdomen is soft.     Tenderness: There is no abdominal tenderness. There is no guarding.  Musculoskeletal:        General: No swelling. Normal range of motion.     Cervical back: Normal range of motion and neck supple. No rigidity.  Skin:    General: Skin is warm.     Capillary Refill: Capillary refill takes less than 2 seconds.     Findings: No rash.  Neurological:     General: No focal deficit present.     Mental Status: She is alert.     Cranial Nerves: No cranial nerve deficit.     Comments: General weakness, moves all extremities equal bilateral.  Psychiatric:        Mood and Affect: Mood normal.     ED Results / Procedures / Treatments   Labs (all labs ordered are listed, but only abnormal results are displayed) Labs Reviewed  CBC WITH  DIFFERENTIAL/PLATELET - Abnormal; Notable for the following components:      Result Value   WBC 11.0 (*)    RDW 15.7 (*)    All other components within normal limits  BASIC METABOLIC PANEL - Abnormal; Notable for the following components:   Chloride 92 (*)    Glucose, Bld 306 (*)    BUN 29 (*)    Creatinine, Ser 5.54 (*)    Calcium 6.6 (*)    GFR, Estimated 8 (*)    Anion gap 16 (*)    All other components within normal limits  I-STAT VENOUS BLOOD GAS, ED -  Abnormal; Notable for the following components:   Bicarbonate 30.7 (*)    Acid-Base Excess 5.0 (*)    Sodium 134 (*)    Calcium, Ion 0.72 (*)    All other components within normal limits  CULTURE, BLOOD (ROUTINE X 2)  CULTURE, BLOOD (ROUTINE X 2)  LACTIC ACID, PLASMA  TROPONIN I (HIGH SENSITIVITY)    EKG EKG Interpretation  Date/Time:  Saturday June 23 2022 17:53:13 EDT Ventricular Rate:  95 PR Interval:  172 QRS Duration: 80 QT Interval:  403 QTC Calculation: 507 R Axis:   -10 Text Interpretation: Sinus rhythm Atrial premature complex Consider right atrial enlargement Low voltage, precordial leads Abnrm T, consider ischemia, anterolateral lds Prolonged QT interval Confirmed by Elnora Morrison [phone]) on 06/23/2022 6:19:26 PM  Radiology DG Chest Portable 1 View  Result Date: 06/23/2022 CLINICAL DATA:  Hypotension. EXAM: PORTABLE CHEST 1 VIEW COMPARISON:  Chest radiograph dated 03/04/2021. FINDINGS: No focal consolidation, pleural effusion, or pneumothorax. Mild central vascular congestion. Cardiac silhouette is within normal limits. SVC stent is noted. The osseous structures are intact. The soft tissues are unremarkable. IMPRESSION: Mild central vascular congestion. No focal consolidation. Electronically Signed   By: Anner Crete M.D.   On: 06/23/2022 19:12    Procedures .Critical Care  Performed by: Elnora Morrison, MD Authorized by: Elnora Morrison, MD   Critical care provider statement:    Critical care time  (minutes):  30   Critical care start time:  06/23/2022 6:30 PM   Critical care end time:  06/23/2022 7:00 PM   Critical care time was exclusive of:  Separately billable procedures and treating other patients and teaching time   Critical care was necessary to treat or prevent imminent or life-threatening deterioration of the following conditions:  Shock   Critical care was time spent personally by me on the following activities:  Development of treatment plan with patient or surrogate, discussions with consultants, evaluation of patient's response to treatment, examination of patient, ordering and review of laboratory studies, ordering and review of radiographic studies, ordering and performing treatments and interventions, pulse oximetry, re-evaluation of patient's condition and review of old charts Ultrasound ED Echo  Date/Time: 06/23/2022 7:29 PM  Performed by: Elnora Morrison, MD Authorized by: Elnora Morrison, MD   Procedure details:    Indications: hypotension     Views: subxiphoid, parasternal long axis view and parasternal short axis view     Limitations:  Acoustic shadowing and body habitus Findings:    Pericardium: no pericardial effusion     LV Function: normal (>50% EF)   Impression:    Impression: normal       Medications Ordered in ED Medications  EPINEPHrine (ADRENALIN) 5 mg in NS 250 mL (0.02 mg/mL) premix infusion (0.5 mcg/min Intravenous New Bag/Given 06/23/22 1820)  sodium chloride 0.9 % bolus 500 mL (has no administration in time range)  hydrocortisone sodium succinate (SOLU-CORTEF) 100 MG injection 100 mg (has no administration in time range)  sodium chloride 0.9 % bolus 250 mL (250 mLs Intravenous New Bag/Given 06/23/22 1804)    ED Course/ Medical Decision Making/ A&P                           Medical Decision Making Amount and/or Complexity of Data Reviewed Labs: ordered. Radiology: ordered. ECG/medicine tests: ordered.  Risk Prescription drug  management. Decision regarding hospitalization.   Patient presents with persistent hypotension and general weakness from dialysis.  Differential includes postdialysis from  fluid removal, dehydration, metabolic/electrolyte related, anemia, cardiac related, steroid related, pericardial effusion, sepsis early, other.  Patient denies fevers, initial temperature is 98 degrees.  Discussed with nursing rectal temperature for completeness.  Patient received IV fluids 500 cc prior to arrival and then 500 cc here however patient still requiring between 6 and 8 mcg/min of epinephrine.  Nursing tried different arms and still hypotensive.  Lactic acid pending, blood cultures pending.  Bedside ultrasound IVC collapsing and no significant decreased ejection fraction or significant pericardial effusion although limited views due to body habitus. Steroid IV dose added.  Discussed with critical care for their assistance and admission.  Chest x-ray reviewed no acute findings no significant fluid.  Glucose aligned elevated 306, mild anion gap 16, likely secondary to renal failure and dehydration/prerenal.  EKG reviewed no acute ischemia.        Final Clinical Impression(s) / ED Diagnoses Final diagnoses:  ESRD (end stage renal disease) on dialysis ([place])  Hypotension, unspecified hypotension type  Hyponatremia  Hyperglycemia    Rx / DC Orders ED Discharge Orders     None         Elnora Morrison, MD 06/23/22 1930

## 2022-06-23 NOTE — H&P (Signed)
NAME:  [name], MRN:  [id number], DOB:  22-Nov-1961, LOS: 0 ADMISSION DATE:  06/23/2022, CONSULTATION DATE:  06/23/2022  REFERRING MD:  Reather Converse, EDP, CHIEF COMPLAINT:  `   History of Present Illness:  61 year old with ESRD on dialysis for many years, underwent her usual session of 4.5 hours of dialysis.  Her weight going and was 109 and weight postdialysis was 103 pounds, her dry weight is 105.  She was BIBEMS from home for generalized weakness and near syncope, found to be hypotensive with initial blood pressure 56 systolic, given 762 normal saline in route and started on epi drip by EMS at 4 mics with blood pressure 88/54 on arrival to ED. she was given another 500 cc of fluid and epi was titrated to 8 mics to obtain blood pressure in the systolic 90 range.  Blood pressure was checked on multiple sites. Bedside echo by EDP showed normal LV function and no  Effusion She reports mild generalized headache .  No fevers, chest pain, shortness of breath  Pertinent  Medical History  ESRD on HD-TTS , dry weight 105 pounds -Difficult to access currently being dialyzed via left femoral AV graft with several revisions -SVC syndrome status post stent -Chronic hypotension on midodrine -"COPD", never smoker -OSA on CPAP -Diabetes type 2 on insulin  Significant Hospital Events: Including procedures, antibiotic start and stop dates in addition to other pertinent events     Interim History / Subjective:  Complains of mild headache  Objective   Blood pressure (!) 86/64, pulse 96, temperature 98.5 F (36.9 C), temperature source Oral, resp. rate 18, SpO2 100 %.       No intake or output data in the 24 hours ending 06/23/22 1957 There were no vitals filed for this visit.  Examination: General: Adult woman lying supine in ED stretcher, no distress HENT: Mild pallor, no icterus, short neck, no JVD, multiple scars on neck and upper chest related to old dialysis access Lungs: Clear bilateral,  no accessory muscle use Cardiovascular: S1-S2 regular, no murmur, no rub, peak T waves on monitor Abdomen: Soft, nontender, no bowel splenomegaly Extremities: No deformity signs of old graft, left femoral AVG with good thrill, no tenderness Neuro: Alert oriented x3, no neck stiffness, nonfocal   Labs show normal electrolytes, ionized calcium 0.7, borderline leukocytosis Chest x-ray independently reviewed shows SVC stent, mild central vascular congestion EKG shows sinus rhythm and peaked T waves in inferolateral leads  Resolved Hospital Problem list     Assessment & Plan:  Acute on chronic hypotension, postdialysis , requiring pressors No overt evidence of sepsis Lactic acidosis indicates that hypotension is renal, also checked on multiple extremities -It is possible that she was over dialyzed given that her dry weight is 105 and she was 103 pounds -No reason to suspect adrenal insufficiency, she was given prednisone prior to vascular procedure due to contrast allergy  -Received 1 L of fluids, give another 500 cc slow -Convert from epinephrine to peripheral Levophed , aim for systolic blood pressure 26-33 and above -Continue midodrine 10 mg thrice daily -Obtain echo -Obtain procalcitonin and if high will add vancomycin to cover bacteremia   OSA -resume home CPAP "COPD" versus asthma -resume Advair , no PFTs available  Best Practice (right click and "Reselect all SmartList Selections" daily)   Diet/type: Regular consistency (see orders) DVT prophylaxis: prophylactic heparin  GI prophylaxis: N/A Lines: N/A Foley:  N/A Code Status:  full code Last date of multidisciplinary goals of care  discussion [NA] patient and husband updated at bedside  Labs   CBC: Recent Labs  Lab 06/23/22 1751 06/23/22 1843  WBC 11.0*  --   NEUTROABS 7.5  --   HGB 12.9 13.9  HCT 40.0 41.0  MCV 93.9  --   PLT 163  --     Basic Metabolic Panel: Recent Labs  Lab 06/23/22 1751 06/23/22 1843   NA 135 134*  K 4.5 4.4  CL 92*  --   CO2 27  --   GLUCOSE 306*  --   BUN 29*  --   CREATININE 5.54*  --   CALCIUM 6.6*  --    GFR: CrCl cannot be calculated (Unknown ideal weight.). Recent Labs  Lab 06/23/22 1751  WBC 11.0*  LATICACIDVEN 2.7*    Liver Function Tests: No results for input(s): "AST", "ALT", "ALKPHOS", "BILITOT", "PROT", "ALBUMIN" in the last 168 hours. No results for input(s): "LIPASE", "AMYLASE" in the last 168 hours. No results for input(s): "AMMONIA" in the last 168 hours.  ABG    Component Value Date/Time   PHART 7.322 (L) 03/04/2021 0236   PCO2ART 56.1 (H) 03/04/2021 0236   PO2ART 80.4 (L) 03/04/2021 0236   HCO3 30.7 (H) 06/23/2022 1843   TCO2 32 06/23/2022 1843   ACIDBASEDEF 1.2 08/10/2014 1252   O2SAT 78 06/23/2022 1843     Coagulation Profile: No results for input(s): "INR", "PROTIME" in the last 168 hours.  Cardiac Enzymes: No results for input(s): "CKTOTAL", "CKMB", "CKMBINDEX", "TROPONINI" in the last 168 hours.  HbA1C: Hgb A1c MFr Bld  Date/Time Value Ref Range Status  03/04/2021 03:34 AM 5.4 4.8 - 5.6 % Final    Comment:    (NOTE) Pre diabetes:          5.7%-6.4%  Diabetes:              >6.4%  Glycemic control for   <7.0% adults with diabetes   08/16/2015 03:32 AM 6.6 (H) 4.8 - 5.6 % Final    Comment:    (NOTE)         Pre-diabetes: 5.7 - 6.4         Diabetes: >6.4         Glycemic control for adults with diabetes: <7.0     CBG: No results for input(s): "GLUCAP" in the last 168 hours.  Review of Systems:   Headache Generalized weakness   Constitutional: negative for anorexia, fevers and sweats  Eyes: negative for irritation, redness and visual disturbance  Ears, nose, mouth, throat, and face: negative for earaches, epistaxis, nasal congestion and sore throat  Respiratory: negative for cough, dyspnea on exertion, sputum and wheezing  Cardiovascular: negative for chest pain, dyspnea, lower extremity edema,  orthopnea, palpitations and syncope  Gastrointestinal: negative for abdominal pain, constipation, diarrhea, melena, nausea and vomiting  Genitourinary:negative for dysuria, frequency and hematuria  Hematologic/lymphatic: negative for bleeding, easy bruising and lymphadenopathy  Musculoskeletal:negative for arthralgias, muscle weakness and stiff joints  Neurological: negative for coordination problems, gait problems and weakness  Endocrine: negative for diabetic symptoms including polydipsia, polyuria and weight loss   Past Medical History:  She,  has a past medical history of Arthritis, Chronic hypotension, Complication of anesthesia (2016), COPD (chronic obstructive pulmonary disease) ([place]), Diabetes mellitus type 2 in obese ([place]), ESRD (end stage renal disease) on dialysis ([place]), GERD (gastroesophageal reflux disease), Iron deficiency anemia, Obesities, morbid ([place]), OSA on CPAP, PE (pulmonary embolism) (~ 2010), Pneumonia, Shortness of breath, and Superior vena cava syndrome.  Surgical History:   Past Surgical History:  Procedure Laterality Date   ARTERIOVENOUS GRAFT PLACEMENT Left ~ 2011   "thigh" (4   CARPAL TUNNEL RELEASE Right ~ 2011   CARPAL TUNNEL RELEASE Right 01/28/2015   Procedure: RIGHT CARPAL TUNNEL RELEASE;  Surgeon: Roseanne Kaufman, MD;  Location: [facility];  Service: Orthopedics;  Laterality: Right;   PARATHYROIDECTOMY  02/1993   REVISION OF ARTERIOVENOUS GORETEX GRAFT Left 08/14/2015   Procedure: REVISION OF ARTERIOVENOUS GORETEX GRAFT LEFT THIGH with  REMOVAL of segment of infected graft.;  Surgeon: Angelia Mould, MD;  Location: [facility] OR;  Service: Vascular;  Laterality: Left;   REVISION OF ARTERIOVENOUS GORETEX GRAFT Left 03/03/2021   Procedure: REVISION OF LEFT THIGH ARTERIOVENOUS GORETEX GRAFT;  Surgeon: Elam Dutch, MD;  Location: [facility];  Service: Vascular;  Laterality: Left;   THROMBECTOMY AND REVISION OF ARTERIOVENTOUS (AV) GORETEX  GRAFT Left ~ 12/2012   "thigh"     VASCULAR SURGERY       Social History:   reports that she has never smoked. She has never used smokeless tobacco. She reports that she does not drink alcohol and does not use drugs.   Family History:  Her family history includes Diabetes Mellitus II in her father and mother; Hypertension in her father and mother.   Allergies Allergies  Allergen Reactions   Amoxicillin Anaphylaxis and Other (See Comments)    Throat closes and gets sweaty.   Penicillins Anaphylaxis    Throat closes and gets sweaty   Iohexol Other (See Comments)     Desc: scratchy/itchy throat/itching on back / nausea    Ceftazidime Nausea And Vomiting     Home Medications  Prior to Admission medications   Medication Sig Start Date End Date Taking? Authorizing Provider  acetaminophen (TYLENOL) 500 MG tablet Take 500 mg by mouth every 6 (six) hours as needed for moderate pain or headache.    [provider]  Alcohol Swabs (ALCOHOL PADS) 70 % PADS SMARTSIG:2 Topical Twice Daily 04/14/21   [provider]  ATROVENT HFA 17 MCG/ACT inhaler Inhale 1 puff into the lungs every 6 (six) hours as needed for wheezing. 01/03/16   [provider]  brimonidine (ALPHAGAN) 0.2 % ophthalmic solution Place 1 drop into both eyes 3 (three) times daily.    [provider]  DIPHENHYDRAMINE HCL PO Take by mouth. 06/24/21 06/23/22  [provider]  dorzolamide-timolol (COSOPT) 22.3-6.8 MG/ML ophthalmic solution Place 1 drop into both eyes 2 (two) times daily.    [provider]  doxycycline (VIBRA-TABS) 100 MG tablet Take 100 mg by mouth 2 (two) times daily. 02/27/21   [provider]  EASY COMFORT INSULIN SYRINGE 31G X 5/16" 1 ML [place]  04/14/21   [provider]  Easy Comfort Lancets [place]  04/14/21   [provider]  ferric citrate (AURYXIA) 1 GM 210 MG(Fe) tablet Take 630 mg by mouth 3 (three) times daily with meals.    [provider]  Ferrous Sulfate (IRON  PO) Take 1 tablet by mouth daily.    [provider]  Fluticasone-Salmeterol (ADVAIR) 100-50 MCG/DOSE AEPB Inhale 1 puff into the lungs 2 (two) times daily as needed (shortness of breath).    [provider]  heparin 1000 unit/mL SOLN injection Heparin Sodium (Porcine) 1,000 Units/mL Systemic 08/03/21 08/02/22  [provider]  insulin aspart (NOVOLOG) 100 UNIT/ML injection Inject 10-25 Units into the skin 2 (two) times daily. Per sliding scale  Takes in am  and evening meal    [provider]  latanoprost (XALATAN) 0.005 % ophthalmic solution Place 1 drop into both eyes at bedtime.    [provider]  meloxicam (MOBIC) 15 MG tablet Take 15 mg by mouth daily. Patient not taking: Reported on 10/11/2021 07/20/21   [provider]  metoCLOPramide (REGLAN) 5 MG tablet Take 5 mg by mouth at bedtime.    [provider]  midodrine (PROAMATINE) 10 MG tablet Take 1 tablet (10 mg total) by mouth 3 (three) times daily with meals. Patient taking differently: Take 10 mg by mouth in the morning and at bedtime. 08/18/15   Cristal Ford, DO  Multiple Vitamin (MULTIVITAMIN WITH MINERALS) TABS tablet Take 1 tablet by mouth daily.    [provider]  [facility] VERIO test strip 1 each 4 (four) times daily. 04/14/21   [provider]  predniSONE (DELTASONE) 20 MG tablet Take by mouth. 07/27/21   [provider]  traMADol (ULTRAM) 50 MG tablet Take 50 mg by mouth 3 (three) times daily. 03/04/21   [provider]     Critical care time: [place] MD. FCCP. [facility] Pager : [phone]  If no response to pager , please call [phone] until 7 pm After 7:00 pm call Elink  [phone]   06/23/2022

## 2022-06-23 NOTE — ED Triage Notes (Signed)
BIB [facility] from home. Dialysis today full treatment. Near syncope-weakness.   Initial BP 56 palp. HR 80 96% RA CBG 358 20 LAC 500NS--> No change On Epi drip upon arrival 75mcg/min--> 88/50mmHg -CP -NIH

## 2022-06-23 NOTE — Progress Notes (Signed)
[facility] Note Patient Name: [name] DOB: 1961-02-19 MRN: [id number]   Date of Service  06/23/2022  HPI/Events of Note  33 F ESRD on HD via left femoral AV graft, DM (A1C 8.2), presented with generalized weakness and was hypotensive on EMS arrival started on epi drip. Received 1.5 liters crystalloids.  Seen chatting on the phone On norepinephrine drip  eICU Interventions  Awaiting procalcitonin to decide if antibiotics indicated Hypotension felt to be volume related at this time as no clear source of infection. Cultures pending     Intervention Category Evaluation Type: New Patient Evaluation  Judd Lien 06/23/2022, 10:12 PM

## 2022-06-24 ENCOUNTER — Encounter (HOSPITAL_COMMUNITY): Payer: Self-pay | Admitting: Pulmonary Disease

## 2022-06-24 ENCOUNTER — Inpatient Hospital Stay (HOSPITAL_COMMUNITY): Payer: Medicare Other

## 2022-06-24 ENCOUNTER — Other Ambulatory Visit: Payer: Self-pay

## 2022-06-24 DIAGNOSIS — Z992 Dependence on renal dialysis: Secondary | ICD-10-CM | POA: Diagnosis not present

## 2022-06-24 DIAGNOSIS — E875 Hyperkalemia: Secondary | ICD-10-CM | POA: Diagnosis not present

## 2022-06-24 DIAGNOSIS — I272 Pulmonary hypertension, unspecified: Secondary | ICD-10-CM | POA: Diagnosis not present

## 2022-06-24 DIAGNOSIS — N186 End stage renal disease: Secondary | ICD-10-CM | POA: Diagnosis not present

## 2022-06-24 LAB — LACTIC ACID, PLASMA: Lactic Acid, Venous: 1.1 mmol/L (ref 0.5–1.9)

## 2022-06-24 LAB — COMPREHENSIVE METABOLIC PANEL
ALT: 17 U/L (ref 0–44)
AST: 13 U/L — ABNORMAL LOW (ref 15–41)
Albumin: 3.4 g/dL — ABNORMAL LOW (ref 3.5–5.0)
Alkaline Phosphatase: 127 U/L — ABNORMAL HIGH (ref 38–126)
Anion gap: 17 — ABNORMAL HIGH (ref 5–15)
BUN: 38 mg/dL — ABNORMAL HIGH (ref 8–23)
CO2: 24 mmol/L (ref 22–32)
Calcium: 6.4 mg/dL — CL (ref 8.9–10.3)
Chloride: 93 mmol/L — ABNORMAL LOW (ref 98–111)
Creatinine, Ser: 6.38 mg/dL — ABNORMAL HIGH (ref 0.44–1.00)
GFR, Estimated: 7 mL/min — ABNORMAL LOW (ref >60)
Glucose, Bld: 213 mg/dL — ABNORMAL HIGH (ref 70–99)
Potassium: 6.3 mmol/L (ref 3.5–5.1)
Sodium: 134 mmol/L — ABNORMAL LOW (ref 135–145)
Total Bilirubin: 0.6 mg/dL (ref 0.3–1.2)
Total Protein: 7.3 g/dL (ref 6.5–8.1)

## 2022-06-24 LAB — BASIC METABOLIC PANEL
Anion gap: 19 — ABNORMAL HIGH (ref 5–15)
Anion gap: 20 — ABNORMAL HIGH (ref 5–15)
BUN: 42 mg/dL — ABNORMAL HIGH (ref 8–23)
BUN: 44 mg/dL — ABNORMAL HIGH (ref 8–23)
CO2: 20 mmol/L — ABNORMAL LOW (ref 22–32)
CO2: 22 mmol/L (ref 22–32)
Calcium: 7.1 mg/dL — ABNORMAL LOW (ref 8.9–10.3)
Calcium: 6.6 mg/dL — ABNORMAL LOW (ref 8.9–10.3)
Chloride: 98 mmol/L (ref 98–111)
Chloride: 94 mmol/L — ABNORMAL LOW (ref 98–111)
Creatinine, Ser: 6.8 mg/dL — ABNORMAL HIGH (ref 0.44–1.00)
Creatinine, Ser: 6.88 mg/dL — ABNORMAL HIGH (ref 0.44–1.00)
GFR, Estimated: 6 mL/min — ABNORMAL LOW (ref >60)
GFR, Estimated: 6 mL/min — ABNORMAL LOW (ref >60)
Glucose, Bld: 160 mg/dL — ABNORMAL HIGH (ref 70–99)
Glucose, Bld: 198 mg/dL — ABNORMAL HIGH (ref 70–99)
Potassium: 5.9 mmol/L — ABNORMAL HIGH (ref 3.5–5.1)
Potassium: 4.6 mmol/L (ref 3.5–5.1)
Sodium: 137 mmol/L (ref 135–145)
Sodium: 136 mmol/L (ref 135–145)

## 2022-06-24 LAB — ECHOCARDIOGRAM COMPLETE
AR max vel: 2.49 cm2
AV Area VTI: 2.74 cm2
AV Area mean vel: 2.39 cm2
AV Mean grad: 6 mmHg
AV Peak grad: 10.5 mmHg
Ao pk vel: 1.62 m/s
Area-P 1/2: 3.21 cm2
Height: 61 in
S' Lateral: 2.3 cm
Weight: 2959.46 oz

## 2022-06-24 LAB — GLUCOSE, CAPILLARY
Glucose-Capillary: 147 mg/dL — ABNORMAL HIGH (ref 70–99)
Glucose-Capillary: 188 mg/dL — ABNORMAL HIGH (ref 70–99)
Glucose-Capillary: 193 mg/dL — ABNORMAL HIGH (ref 70–99)
Glucose-Capillary: 202 mg/dL — ABNORMAL HIGH (ref 70–99)
Glucose-Capillary: 241 mg/dL — ABNORMAL HIGH (ref 70–99)
Glucose-Capillary: 247 mg/dL — ABNORMAL HIGH (ref 70–99)
Glucose-Capillary: 257 mg/dL — ABNORMAL HIGH (ref 70–99)
Glucose-Capillary: 310 mg/dL — ABNORMAL HIGH (ref 70–99)

## 2022-06-24 LAB — CBC
HCT: 37.4 % (ref 36.0–46.0)
Hemoglobin: 12.5 g/dL (ref 12.0–15.0)
MCH: 30.3 pg (ref 26.0–34.0)
MCHC: 33.4 g/dL (ref 30.0–36.0)
MCV: 90.8 fL (ref 80.0–100.0)
Platelets: 184 10*3/uL (ref 150–400)
RBC: 4.12 MIL/uL (ref 3.87–5.11)
RDW: 15.9 % — ABNORMAL HIGH (ref 11.5–15.5)
WBC: 14.6 10*3/uL — ABNORMAL HIGH (ref 4.0–10.5)
nRBC: 0 % (ref 0.0–0.2)

## 2022-06-24 LAB — TROPONIN I (HIGH SENSITIVITY): Troponin I (High Sensitivity): 27 ng/L — ABNORMAL HIGH (ref <18)

## 2022-06-24 MED ORDER — DEXTROSE 50 % IV SOLN
1.0000 | Freq: Once | INTRAVENOUS | Status: AC
Start: 2022-06-24 — End: 2022-06-24
  Administered 2022-06-24: 50 mL via INTRAVENOUS
  Filled 2022-06-24: qty 50

## 2022-06-24 MED ORDER — PREDNISONE 20 MG PO TABS
20.0000 mg | ORAL_TABLET | Freq: Every day | ORAL | Status: DC
  Administered 2022-06-25: 20 mg via ORAL
  Filled 2022-06-24: qty 1

## 2022-06-24 MED ORDER — CALCIUM GLUCONATE-NACL 1-0.675 GM/50ML-% IV SOLN
1.0000 g | Freq: Once | INTRAVENOUS | Status: AC
  Administered 2022-06-24: 1000 mg via INTRAVENOUS
  Filled 2022-06-24: qty 50

## 2022-06-24 MED ORDER — PERFLUTREN LIPID MICROSPHERE
1.0000 mL | INTRAVENOUS | Status: AC | PRN
  Administered 2022-06-24: 4 mL via INTRAVENOUS

## 2022-06-24 MED ORDER — SODIUM CHLORIDE 0.9 % IV BOLUS
1000.0000 mL | Freq: Once | INTRAVENOUS | Status: AC
Start: 2022-06-24 — End: 2022-06-25
  Administered 2022-06-24: 1000 mL via INTRAVENOUS

## 2022-06-24 MED ORDER — MIDODRINE HCL 5 MG PO TABS
20.0000 mg | ORAL_TABLET | Freq: Three times a day (TID) | ORAL | Status: DC
  Administered 2022-06-24 – 2022-06-26 (×6): 20 mg via ORAL
  Filled 2022-06-24 (×7): qty 4

## 2022-06-24 MED ORDER — SODIUM POLYSTYRENE SULFONATE 15 GM/60ML PO SUSP
30.0000 g | Freq: Once | ORAL | Status: DC
Start: 2022-06-24 — End: 2022-06-24

## 2022-06-24 MED ORDER — INSULIN ASPART 100 UNIT/ML IV SOLN
10.0000 [IU] | Freq: Once | INTRAVENOUS | Status: AC
  Administered 2022-06-24: 10 [IU] via INTRAVENOUS

## 2022-06-24 MED ORDER — INSULIN ASPART 100 UNIT/ML IV SOLN
5.0000 [IU] | Freq: Once | INTRAVENOUS | Status: AC
Start: 2022-06-24 — End: 2022-06-24
  Administered 2022-06-24: 5 [IU] via INTRAVENOUS

## 2022-06-24 MED ORDER — CHLORHEXIDINE GLUCONATE CLOTH 2 % EX PADS: 6.0000 | MEDICATED_PAD | Freq: Every day | CUTANEOUS | Status: DC

## 2022-06-24 MED ORDER — SODIUM ZIRCONIUM CYCLOSILICATE 10 G PO PACK
10.0000 g | PACK | Freq: Once | ORAL | Status: AC
Start: 2022-06-24 — End: 2022-06-24
  Administered 2022-06-24: 10 g
  Filled 2022-06-24: qty 1

## 2022-06-24 NOTE — Progress Notes (Signed)
RN received call from Lab in regards to a previously ordered and drawn procalcitonin specimen scheduled on 06/24/2022 at 0923. RN was made aware that there was not enough blood in the sample for the procalcitonin to be ran. Pt is a lab draw and per Lab, they are going to re-order a procalcitonin lab to be drawn with patient AM labs.

## 2022-06-24 NOTE — Progress Notes (Signed)
[facility] Progress Note Patient Name: [name] DOB: Mar 09, 1961 MRN: [id number]   Date of Service  06/24/2022  HPI/Events of Note  K 6.3, Ca 6.4 Albumin 3.4  eICU Interventions  Dextrose/insulin and calcium ordered as per hyperkalemia protocol     Intervention Category Intermediate Interventions: Electrolyte abnormality - evaluation and management  Raquel Sarna T Krithik Mapel 06/24/2022, 6:00 AM

## 2022-06-24 NOTE — Progress Notes (Signed)
NAME:  [name], MRN:  [id number], DOB:  Nov 06, 1961, LOS: 1 ADMISSION DATE:  06/23/2022, CONSULTATION DATE:  06/24/2022  REFERRING MD:  Reather Converse, EDP, CHIEF COMPLAINT:  `   History of Present Illness:  61 year old with ESRD on dialysis for many years, underwent her usual session of 4.5 hours of dialysis.  Her weight going and was 109 and weight postdialysis was 103 pounds, her dry weight is 105 kg.  She was BIBEMS from home for generalized weakness and near syncope, found to be hypotensive with initial blood pressure 56 systolic, given 294 normal saline in route and started on epi drip by EMS at 4 mics with blood pressure 88/54 on arrival to ED. she was given another 500 cc of fluid and epi was titrated to 8 mics to obtain blood pressure in the systolic 90 range.  Blood pressure was checked on multiple sites. Bedside echo by EDP showed normal LV function and no  Effusion She reports mild generalized headache .  No fevers, chest pain, shortness of breath  Pertinent  Medical History  ESRD on HD-TTS , dry weight 105 pounds -Difficult to access currently being dialyzed via left femoral AV graft with several revisions -SVC syndrome status post stent -Chronic hypotension on midodrine -"COPD", never smoker -OSA on CPAP -Diabetes type 2 on insulin  Significant Hospital Events: Including procedures, antibiotic start and stop dates in addition to other pertinent events   7/15 admit to PCCM with hypotension 7/16 feeling improved, remains on Levophed 65mcg, afebrile   Interim History / Subjective:  States overall feeling improved, but is having night sweats  Objective   Blood pressure 118/75, pulse 70, temperature 98.4 F (36.9 C), temperature source Oral, resp. rate (!) 8, height 5\' 1"  (1.549 m), weight 83.9 kg, SpO2 95 %.        Intake/Output Summary (Last 24 hours) at 06/24/2022 0754 Last data filed at 06/24/2022 0404 Gross per 24 hour  Intake 1335.39 ml  Output --  Net 1335.39 ml    Filed Weights   06/23/22 2212  Weight: 83.9 kg    General:  chronically ill-appearing F, resting in bed in no distress HEENT: MM pink/moist, sclera anicteric Neuro: awake, alert, oriented and following commands CV: s1s2 rrr, no m/r/g PULM:  clear bilaterally without rhonchi or wheezing GI: soft, non-tender Extremities: warm/dry, no edema, L femoral AVG Skin: no rashes or lesions  Labs reviewed K 6.3 Ca 6.4 Lactic acid 3.0->1.1 Procal 0.87, repeat pending      Resolved Hospital Problem list     Assessment & Plan:    Acute on chronic hypotension, postdialysis ,  No overt evidence of sepsis, though having night sweats and initial lactic acid of 3.0 -It is possible that she was over dialyzed given that she was below her dry weight -No reason to suspect adrenal insufficiency, she was given prednisone prior to vascular procedure due to contrast allergy, cortisol >100 -Received 1.5L IVF yesterday -remains on 53mcg Levophed -Continue midodrine 10 mg tid, consider increasing -Echo pending -follow blood cultures to completion -Procal 0.87 yesterday, trend today -if spikes fever start empiric abx   ESRD Hyperkalemia Hypocalcemia  Last dialyzed 7/15, due 7/18 -K 6.3 this AM, gave Ca, 10 units IV insulin and Lokelma -repeat BMP -nephrology consult  OSA -resume home CPAP "COPD" versus asthma -resume Advair , no PFTs available  Best Practice (right click and "Reselect all SmartList Selections" daily)   Diet/type: Regular consistency (see orders) DVT prophylaxis: prophylactic heparin  GI  prophylaxis: N/A Lines: N/A Foley:  N/A Code Status:  full code Last date of multidisciplinary goals of care discussion [NA] patient and husband updated at bedside 7/16  Labs   CBC: Recent Labs  Lab 06/23/22 1751 06/23/22 1843 06/23/22 2100 06/24/22 0500  WBC 11.0*  --  16.3* 14.6*  NEUTROABS 7.5  --   --   --   HGB 12.9 13.9 12.5 12.5  HCT 40.0 41.0 38.1 37.4  MCV 93.9   --  93.8 90.8  PLT 163  --  196 184     Basic Metabolic Panel: Recent Labs  Lab 06/23/22 1751 06/23/22 1843 06/23/22 2100 06/24/22 0500  NA 135 134*  --  134*  K 4.5 4.4  --  6.3*  CL 92*  --   --  93*  CO2 27  --   --  24  GLUCOSE 306*  --   --  213*  BUN 29*  --   --  38*  CREATININE 5.54*  --  5.67* 6.38*  CALCIUM 6.6*  --   --  6.4*  MG  --   --  1.7  --   PHOS  --   --  4.2  --     GFR: Estimated Creatinine Clearance: 9.1 mL/min (A) (by C-G formula based on SCr of 6.38 mg/dL (H)). Recent Labs  Lab 06/23/22 1751 06/23/22 2100 06/24/22 0500  PROCALCITON  --  0.87  --   WBC 11.0* 16.3* 14.6*  LATICACIDVEN 2.7* 3.0* 1.1     Liver Function Tests: Recent Labs  Lab 06/24/22 0500  AST 13*  ALT 17  ALKPHOS 127*  BILITOT 0.6  PROT 7.3  ALBUMIN 3.4*   No results for input(s): "LIPASE", "AMYLASE" in the last 168 hours. No results for input(s): "AMMONIA" in the last 168 hours.  ABG    Component Value Date/Time   PHART 7.322 (L) 03/04/2021 0236   PCO2ART 56.1 (H) 03/04/2021 0236   PO2ART 80.4 (L) 03/04/2021 0236   HCO3 30.7 (H) 06/23/2022 1843   TCO2 32 06/23/2022 1843   ACIDBASEDEF 1.2 08/10/2014 1252   O2SAT 78 06/23/2022 1843     Coagulation Profile: No results for input(s): "INR", "PROTIME" in the last 168 hours.  Cardiac Enzymes: No results for input(s): "CKTOTAL", "CKMB", "CKMBINDEX", "TROPONINI" in the last 168 hours.  HbA1C: Hgb A1c MFr Bld  Date/Time Value Ref Range Status  06/23/2022 09:00 PM 8.2 (H) 4.8 - 5.6 % Final    Comment:    (NOTE) Pre diabetes:          5.7%-6.4%  Diabetes:              >6.4%  Glycemic control for   <7.0% adults with diabetes   03/04/2021 03:34 AM 5.4 4.8 - 5.6 % Final    Comment:    (NOTE) Pre diabetes:          5.7%-6.4%  Diabetes:              >6.4%  Glycemic control for   <7.0% adults with diabetes     CBG: Recent Labs  Lab 06/23/22 2144 06/24/22 0059 06/24/22 0315 06/24/22 0716  GLUCAP  359* 310* 193* 247*    Review of Systems:   Please see the history of present illness. All other systems reviewed and are negative    Past Medical History:  She,  has a past medical history of Arthritis, Chronic hypotension, Complication of anesthesia (2016), COPD (chronic obstructive pulmonary disease) (  [place]), Diabetes mellitus type 2 in obese ([place]), ESRD (end stage renal disease) on dialysis ([place]), GERD (gastroesophageal reflux disease), Iron deficiency anemia, Obesities, morbid ([place]), OSA on CPAP, PE (pulmonary embolism) (~ 2010), Pneumonia, Shortness of breath, and Superior vena cava syndrome.   Surgical History:   Past Surgical History:  Procedure Laterality Date   ARTERIOVENOUS GRAFT PLACEMENT Left ~ 2011   "thigh" (4   CARPAL TUNNEL RELEASE Right ~ 2011   CARPAL TUNNEL RELEASE Right 01/28/2015   Procedure: RIGHT CARPAL TUNNEL RELEASE;  Surgeon: Roseanne Kaufman, MD;  Location: [facility];  Service: Orthopedics;  Laterality: Right;   PARATHYROIDECTOMY  02/1993   REVISION OF ARTERIOVENOUS GORETEX GRAFT Left 08/14/2015   Procedure: REVISION OF ARTERIOVENOUS GORETEX GRAFT LEFT THIGH with  REMOVAL of segment of infected graft.;  Surgeon: Angelia Mould, MD;  Location: [facility] OR;  Service: Vascular;  Laterality: Left;   REVISION OF ARTERIOVENOUS GORETEX GRAFT Left 03/03/2021   Procedure: REVISION OF LEFT THIGH ARTERIOVENOUS GORETEX GRAFT;  Surgeon: Elam Dutch, MD;  Location: [facility];  Service: Vascular;  Laterality: Left;   THROMBECTOMY AND REVISION OF ARTERIOVENTOUS (AV) GORETEX  GRAFT Left ~ 12/2012   "thigh"    VASCULAR SURGERY       Social History:   reports that she has never smoked. She has never used smokeless tobacco. She reports that she does not drink alcohol and does not use drugs.   Family History:  Her family history includes Diabetes Mellitus II in her father and mother; Hypertension in her father and mother.   Allergies Allergies  Allergen Reactions   Amoxicillin  Anaphylaxis and Other (See Comments)    Throat closes and gets sweaty.   Penicillins Anaphylaxis    Throat closes and gets sweaty   Iohexol Other (See Comments)     Desc: scratchy/itchy throat/itching on back / nausea    Ceftazidime Nausea And Vomiting     Home Medications  Prior to Admission medications   Medication Sig Start Date End Date Taking? Authorizing Provider  acetaminophen (TYLENOL) 500 MG tablet Take 500 mg by mouth every 6 (six) hours as needed for moderate pain or headache.    [provider]  Alcohol Swabs (ALCOHOL PADS) 70 % PADS SMARTSIG:2 Topical Twice Daily 04/14/21   [provider]  ATROVENT HFA 17 MCG/ACT inhaler Inhale 1 puff into the lungs every 6 (six) hours as needed for wheezing. 01/03/16   [provider]  brimonidine (ALPHAGAN) 0.2 % ophthalmic solution Place 1 drop into both eyes 3 (three) times daily.    [provider]  DIPHENHYDRAMINE HCL PO Take by mouth. 06/24/21 06/23/22  [provider]  dorzolamide-timolol (COSOPT) 22.3-6.8 MG/ML ophthalmic solution Place 1 drop into both eyes 2 (two) times daily.    [provider]  doxycycline (VIBRA-TABS) 100 MG tablet Take 100 mg by mouth 2 (two) times daily. 02/27/21   [provider]  EASY COMFORT INSULIN SYRINGE 31G X 5/16" 1 ML [place]  04/14/21   [provider]  Easy Comfort Lancets [place]  04/14/21   [provider]  ferric citrate (AURYXIA) 1 GM 210 MG(Fe) tablet Take 630 mg by mouth 3 (three) times daily with meals.    [provider]  Ferrous Sulfate (IRON PO) Take 1 tablet by mouth daily.    [provider]  Fluticasone-Salmeterol (ADVAIR) 100-50 MCG/DOSE AEPB Inhale 1 puff into the lungs 2 (two) times daily as needed (shortness of breath).    [provider]  heparin 1000 unit/mL SOLN injection Heparin Sodium (Porcine) 1,000 Units/mL Systemic 08/03/21 08/02/22  [provider]  insulin aspart (NOVOLOG) 100  UNIT/ML injection Inject 10-25 Units into the skin 2 (two) times daily. Per sliding scale  Takes in am and evening meal    [provider]  latanoprost (XALATAN) 0.005 % ophthalmic solution Place 1 drop into both eyes at bedtime.    [provider]  meloxicam (MOBIC) 15 MG tablet Take 15 mg by mouth daily. Patient not taking: Reported on 10/11/2021 07/20/21   [provider]  metoCLOPramide (REGLAN) 5 MG tablet Take 5 mg by mouth at bedtime.    [provider]  midodrine (PROAMATINE) 10 MG tablet Take 1 tablet (10 mg total) by mouth 3 (three) times daily with meals. Patient taking differently: Take 10 mg by mouth in the morning and at bedtime. 08/18/15   Cristal Ford, DO  Multiple Vitamin (MULTIVITAMIN WITH MINERALS) TABS tablet Take 1 tablet by mouth daily.    [provider]  [facility] VERIO test strip 1 each 4 (four) times daily. 04/14/21   [provider]  predniSONE (DELTASONE) 20 MG tablet Take by mouth. 07/27/21   [provider]  traMADol (ULTRAM) 50 MG tablet Take 50 mg by mouth 3 (three) times daily. 03/04/21   [provider]     Critical care time: 35 minutes     CRITICAL CARE Performed by: Otilio Carpen Johnavon Mcclafferty   Total critical care time: 35 minutes  Critical care time was exclusive of separately billable procedures and treating other patients.  Critical care was necessary to treat or prevent imminent or life-threatening deterioration.  Critical care was time spent personally by me on the following activities: development of treatment plan with patient and/or surrogate as well as nursing, discussions with consultants, evaluation of patient's response to treatment, examination of patient, obtaining history from patient or surrogate, ordering and performing treatments and interventions, ordering and review of laboratory studies, ordering and review of radiographic studies, pulse oximetry and re-evaluation of  patient's condition.   Otilio Carpen Ileane Sando, PA-C [facility] See Amion for pager If no response to pager , please call [phone] until 7pm After 7:00 pm call Elink  [phone]?[place]

## 2022-06-24 NOTE — Progress Notes (Signed)
Echocardiogram 2D Echocardiogram has been performed.  [name] 06/24/2022, 2:51 PM

## 2022-06-24 NOTE — Consult Note (Addendum)
Renal Service Consult Note [facility]  [name] 06/24/2022 Sol Blazing, MD Requesting Physician: Dr. Tacy Learn  Reason for Consult: ESRD pt w/ hypotension, gen'd weakness HPI: The patient is a 61 y.o. year-old w/ hx of DJD, chronic hypotension, COPD, DM2, ESRD on HD mwf, anemia, iron deficiency, morbid obesity, hx of PE, OSA, superior vena cava syndrome who presented to ED 7/15 from home w/ near syncope and gen'd weakness. In ED BP's were low around 60/ palp, no hypoxia. Pt rec'd NS bolus 500 cc x 2 and IV pressor support w/ epi gtt. BP's improved to around 90/60. Bedside echo showed normal LV function. Pt was admitted to ICU. We are asked to see for dialysis.   Afebrile since admit here. On levo gtt , down to 1 ug/min but BP"s still low in the 90's and some in the 80s. Pt says she felt real "dehydrated" on admit yesterday. She has been eating out all the time, more than usual, [employer] and [employer]. Eating lots of french fries too which is not usual for her. Has not had any syncopal episodes.   ROS - denies CP, no joint pain, no HA, no blurry vision, no rash, no diarrhea, no nausea/ vomiting   Past Medical History  Past Medical History:  Diagnosis Date   Arthritis    Chronic hypotension    Complication of anesthesia 2016   confusion   COPD (chronic obstructive pulmonary disease) ([place])    Diabetes mellitus type 2 in obese (HCC)    insulin dependent   ESRD (end stage renal disease) on dialysis ([place])    "TTS; [place] [facility]; (03/25/2014)   GERD (gastroesophageal reflux disease)    Iron deficiency anemia    Obesities, morbid (HCC)    OSA on CPAP    PE (pulmonary embolism) ~ 2010   Pneumonia    "now & once a long time ago" (03/26/2013)   Shortness of breath    "just a little bit; at any time" (03/26/2013)   Superior vena cava syndrome    Archie Endo 03/26/2013   Past Surgical History  Past Surgical History:  Procedure Laterality Date   ARTERIOVENOUS GRAFT  PLACEMENT Left ~ 2011   "thigh" (4   CARPAL TUNNEL RELEASE Right ~ 2011   CARPAL TUNNEL RELEASE Right 01/28/2015   Procedure: RIGHT CARPAL TUNNEL RELEASE;  Surgeon: Roseanne Kaufman, MD;  Location: [facility];  Service: Orthopedics;  Laterality: Right;   PARATHYROIDECTOMY  02/1993   REVISION OF ARTERIOVENOUS GORETEX GRAFT Left 08/14/2015   Procedure: REVISION OF ARTERIOVENOUS GORETEX GRAFT LEFT THIGH with  REMOVAL of segment of infected graft.;  Surgeon: Angelia Mould, MD;  Location[facility] OR;  Service: Vascular;  Laterality: Left;   REVISION OF ARTERIOVENOUS GORETEX GRAFT Left 03/03/2021   Procedure: REVISION OF LEFT THIGH ARTERIOVENOUS GORETEX GRAFT;  Surgeon: Elam Dutch, MD;  Location: [facility] OR;  Service: Vascular;  Laterality: Left;   THROMBECTOMY AND REVISION OF ARTERIOVENTOUS (AV) GORETEX  GRAFT Left ~ 12/2012   "thigh"    VASCULAR SURGERY     Family History  Family History  Problem Relation Age of Onset   Diabetes Mellitus II Mother    Hypertension Mother    Hypertension Father    Diabetes Mellitus II Father    Social History  reports that she has never smoked. She has never used smokeless tobacco. She reports that she does not drink alcohol and does not use drugs. Allergies  Allergies  Allergen Reactions  Amoxicillin Anaphylaxis and Other (See Comments)    Throat closes and gets sweaty.   Penicillins Anaphylaxis    Throat closes and gets sweaty   Iohexol Other (See Comments)     Desc: scratchy/itchy throat/itching on back / nausea    Ceftazidime Nausea And Vomiting   Home medications Prior to Admission medications   Medication Sig Start Date End Date Taking? Authorizing Provider  acetaminophen (TYLENOL) 500 MG tablet Take 500 mg by mouth every 6 (six) hours as needed for moderate pain or headache.   Yes [provider]  Capsaicin-Menthol (ALEVEER EX) Apply 1 Application topically 2 (two) times daily as needed (pain).   Yes [provider]  ferric  citrate (AURYXIA) 1 GM 210 MG(Fe) tablet Take 630 mg by mouth 3 (three) times daily with meals.   Yes [provider]  ferrous sulfate 325 (65 FE) MG tablet Take 325 mg by mouth daily with breakfast.   Yes [provider]  Fluticasone-Salmeterol (ADVAIR) 100-50 MCG/DOSE AEPB Inhale 1 puff into the lungs 2 (two) times daily as needed (shortness of breath).   Yes [provider]  insulin aspart (NOVOLOG) 100 UNIT/ML injection Inject 15 Units into the skin 3 (three) times daily before meals.   Yes [provider]  ipratropium (ATROVENT HFA) 17 MCG/ACT inhaler Inhale 2 puffs into the lungs daily as needed for wheezing (shortness of breath).   Yes [provider]  Multiple Vitamin (MULTIVITAMIN WITH MINERALS) TABS tablet Take 1 tablet by mouth daily.   Yes [provider]  traMADol (ULTRAM) 50 MG tablet Take 50 mg by mouth daily as needed (hand pain). 03/04/21  Yes [provider]  Alcohol Swabs (ALCOHOL PADS) 70 % PADS SMARTSIG:2 Topical Twice Daily 04/14/21   [provider]  diphenhydrAMINE (BENADRYL) 25 MG tablet Take 50 mg by mouth See admin instructions. Prior to g Patient not taking: Reported on 06/24/2022    [provider]  [place] X 5/16" 1 ML [place]  04/14/21   [provider]  Easy Comfort Lancets [place]  04/14/21   [provider]  midodrine (PROAMATINE) 10 MG tablet Take 1 tablet (10 mg total) by mouth 3 (three) times daily with meals. 08/18/15   Mikhail, Velta Addison, DO  ONETOUCH VERIO test strip 1 each 4 (four) times daily. 04/14/21   [provider]  predniSONE (DELTASONE) 20 MG tablet Take by mouth. Patient not taking: Reported on 06/24/2022 07/27/21   [provider]     Vitals:   06/24/22 1154 06/24/22 1155 06/24/22 1156 06/24/22 1522  BP:      Pulse: 82 82 84   Resp: (!) 21 19 (!) 23   Temp:    97.9 F (36.6 C)  TempSrc:    Oral  SpO2: 95% 97% 93%    Weight:      Height:       Exam Gen alert, no distress No rash, cyanosis or gangrene Sclera anicteric, throat clear  No jvd or bruits Chest clear bilat to bases, no rales/ wheezing RRR no MRG Abd soft ntnd no mass or ascites +bs GU normal MS no joint effusions or deformity Ext no LE or UE edema, no wounds or ulcers Neuro is alert, Ox 3 , nf    L thigh AVG+bruit   Home meds include - midodrine 10 mg tid, auryxia 3 ac tid   OP HD: [place] TTS  4h  400/500  103.3kg   2K/3Ca bath  P2   Heparin 1500   L thigh AVG - doxercalicferol 2 ug IV tiw w/ hd - last HD 7/15 w/ post wt 102.6kg - last HD 11.8 on 7/11, tsat 48%  Assessment/ Plan: Acute on chronic hypotension - post dialysis yesterday 7/15. Improved w/ 1.5 L bolus and pressors. Pressors weaning down to levo 2 ug/min today. Echo pending. BCx's neg, no fever. No signs of infection w/ new hypotension and pt has been eating out last several mos and may need a new higher dry wt. Her dry wt was lowered about 2 kg a little while ago. Will give 1 L NS overnight to hopefully help w/ getting pressors off and plan raise her dry wt by 1.5- 2 kg or so.  Hyperkalemia -  improved from 6.3 yest to 5.9 today w/ temporizing measures and lokelma. Will plan short HD in am to get K+ down further.  ESRD - on HD TTS. Has not missed HD. Plan 3h HD tonight or possibly tomorrow am. NO fluid off, get K+ down.  BP - takes midodrine at home 10 mg tid for many years  Anemia esrd - Hb stable 12.5, no esa needs OSA Obesity    Rob Talib Headley  MD 06/24/2022, 4:57 PM Recent Labs  Lab 06/23/22 2100 06/24/22 0500 06/24/22 0923 06/24/22 1444  HGB 12.5 12.5  --   --   ALBUMIN  --  3.4*  --   --   CALCIUM  --  6.4* 7.1* 6.6*  PHOS 4.2  --   --   --   CREATININE 5.67* 6.38* 6.80* 6.88*  K  --  6.3* 5.9* 4.6

## 2022-06-25 ENCOUNTER — Encounter (HOSPITAL_COMMUNITY): Payer: Self-pay | Admitting: Pulmonary Disease

## 2022-06-25 DIAGNOSIS — R571 Hypovolemic shock: Secondary | ICD-10-CM | POA: Diagnosis not present

## 2022-06-25 DIAGNOSIS — Z992 Dependence on renal dialysis: Secondary | ICD-10-CM | POA: Diagnosis not present

## 2022-06-25 DIAGNOSIS — N186 End stage renal disease: Secondary | ICD-10-CM | POA: Diagnosis not present

## 2022-06-25 LAB — BASIC METABOLIC PANEL
Anion gap: 16 — ABNORMAL HIGH (ref 5–15)
Anion gap: 12 (ref 5–15)
BUN: 47 mg/dL — ABNORMAL HIGH (ref 8–23)
BUN: 24 mg/dL — ABNORMAL HIGH (ref 8–23)
CO2: 27 mmol/L (ref 22–32)
CO2: 29 mmol/L (ref 22–32)
Calcium: 6.2 mg/dL — CL (ref 8.9–10.3)
Calcium: 7.2 mg/dL — ABNORMAL LOW (ref 8.9–10.3)
Chloride: 94 mmol/L — ABNORMAL LOW (ref 98–111)
Chloride: 95 mmol/L — ABNORMAL LOW (ref 98–111)
Creatinine, Ser: 7.78 mg/dL — ABNORMAL HIGH (ref 0.44–1.00)
Creatinine, Ser: 4.73 mg/dL — ABNORMAL HIGH (ref 0.44–1.00)
GFR, Estimated: 5 mL/min — ABNORMAL LOW (ref >60)
GFR, Estimated: 10 mL/min — ABNORMAL LOW (ref >60)
Glucose, Bld: 165 mg/dL — ABNORMAL HIGH (ref 70–99)
Glucose, Bld: 229 mg/dL — ABNORMAL HIGH (ref 70–99)
Potassium: 4.8 mmol/L (ref 3.5–5.1)
Potassium: 5.5 mmol/L — ABNORMAL HIGH (ref 3.5–5.1)
Sodium: 137 mmol/L (ref 135–145)
Sodium: 136 mmol/L (ref 135–145)

## 2022-06-25 LAB — CBC
HCT: 37.1 % (ref 36.0–46.0)
Hemoglobin: 11.7 g/dL — ABNORMAL LOW (ref 12.0–15.0)
MCH: 30.1 pg (ref 26.0–34.0)
MCHC: 31.5 g/dL (ref 30.0–36.0)
MCV: 95.4 fL (ref 80.0–100.0)
Platelets: 170 10*3/uL (ref 150–400)
RBC: 3.89 MIL/uL (ref 3.87–5.11)
RDW: 16 % — ABNORMAL HIGH (ref 11.5–15.5)
WBC: 8.3 10*3/uL (ref 4.0–10.5)
nRBC: 0 % (ref 0.0–0.2)

## 2022-06-25 LAB — PROCALCITONIN: Procalcitonin: 2.83 ng/mL

## 2022-06-25 LAB — MAGNESIUM
Magnesium: 1.9 mg/dL (ref 1.7–2.4)
Magnesium: 1.6 mg/dL — ABNORMAL LOW (ref 1.7–2.4)

## 2022-06-25 LAB — PHOSPHORUS
Phosphorus: 6.1 mg/dL — ABNORMAL HIGH (ref 2.5–4.6)
Phosphorus: 3.5 mg/dL (ref 2.5–4.6)

## 2022-06-25 LAB — HEPATITIS B CORE ANTIBODY, TOTAL: Hep B Core Total Ab: NONREACTIVE

## 2022-06-25 LAB — GLUCOSE, CAPILLARY
Glucose-Capillary: 125 mg/dL — ABNORMAL HIGH (ref 70–99)
Glucose-Capillary: 152 mg/dL — ABNORMAL HIGH (ref 70–99)
Glucose-Capillary: 115 mg/dL — ABNORMAL HIGH (ref 70–99)
Glucose-Capillary: 219 mg/dL — ABNORMAL HIGH (ref 70–99)
Glucose-Capillary: 275 mg/dL — ABNORMAL HIGH (ref 70–99)

## 2022-06-25 LAB — HEPATITIS C ANTIBODY: HCV Ab: NONREACTIVE

## 2022-06-25 LAB — HEPATITIS B SURFACE ANTIGEN: Hepatitis B Surface Ag: NONREACTIVE

## 2022-06-25 LAB — HEPATITIS B SURFACE ANTIBODY,QUALITATIVE: Hep B S Ab: REACTIVE — AB

## 2022-06-25 MED ORDER — SODIUM ZIRCONIUM CYCLOSILICATE 10 G PO PACK
10.0000 g | PACK | Freq: Once | ORAL | Status: AC
Start: 2022-06-25 — End: 2022-06-25
  Administered 2022-06-25: 10 g via ORAL
  Filled 2022-06-25: qty 1

## 2022-06-25 MED ORDER — CALCIUM GLUCONATE-NACL 1-0.675 GM/50ML-% IV SOLN
1.0000 g | Freq: Once | INTRAVENOUS | Status: AC
  Administered 2022-06-25: 1000 mg via INTRAVENOUS
  Filled 2022-06-25: qty 50

## 2022-06-25 MED ORDER — FLUDROCORTISONE ACETATE 0.1 MG PO TABS
0.1000 mg | ORAL_TABLET | Freq: Every day | ORAL | Status: DC
  Administered 2022-06-26: 0.1 mg via ORAL
  Filled 2022-06-25: qty 1

## 2022-06-25 NOTE — Progress Notes (Signed)
Nephrology Follow-Up Consult note   Assessment/Recommendations: [name] is a/an 61 y.o. female with a past medical history significant for ESRD complicated by hypotension, SVC syndrome status post stent, COPD, OSA, DM 2, admitted for hypotension.       [place] [facility]  4h  400/500  103.3kg   2K/3Ca bath   P2   Heparin 1500   L thigh AVG - doxercalicferol 2 ug IV tiw w/ hd - last HD 7/15 w/ post wt 102.6kg - last HD 11.8 on 7/11, tsat 48%   Acute on chronic hypotension - post dialysis 7/15. Improved w/ 1.5 L bolus and pressors. Pressors weaning down to levo 1 ug/min today. Echo without significant concern. BCx's neg, no fever.  Likely needs new higher dry weight.  Continue to wean pressors as tolerated Hyperkalemia -has improved with dialysis.  Plan for dialysis again tomorrow to maintain schedule ESRD - on HD TTS.  Received dialysis earlier this morning.  Plan for dialysis again tomorrow BP - takes midodrine at home 10 mg tid for many years.  Continue medication.  Wean pressors Anemia esrd - Hb stable 11.7, no esa needs OSA Obesity   Recommendations conveyed to primary service.    [facility] 06/25/2022 8:46 AM  ___________________________________________________________  CC: Hypotension  Interval History/Subjective: Patient states she feels much better today.  No longer having dizziness or lightheadedness.  On 1 mcg of norepinephrine this morning.  Denies shortness of breath or chest pain   Medications:  Current Facility-Administered Medications  Medication Dose Route Frequency Provider Last Rate Last Admin   0.9 %  sodium chloride infusion  250 mL Intravenous Continuous Andres Labrum D, PA-C 6 mL/hr at 06/25/22 0700 Infusion Verify at 06/25/22 0700   acetaminophen (TYLENOL) tablet 650 mg  650 mg Oral Q4H PRN Mick Sell, PA-C   650 mg at 06/23/22 2204   calcium gluconate 1 g/ 50 mL sodium chloride IVPB  1 g Intravenous Once Farrel Gordon, DO       Chlorhexidine Gluconate Cloth 2 % PADS 6 each  6 each Topical Q0600 Rigoberto Noel, MD   6 each at 06/23/22 2202   Chlorhexidine Gluconate Cloth 2 % PADS 6 each  6 each Topical Q0600 Roney Jaffe, MD       docusate sodium (COLACE) capsule 100 mg  100 mg Oral BID PRN Mick Sell, PA-C       [START ON 06/26/2022] fludrocortisone (FLORINEF) tablet 0.1 mg  0.1 mg Oral Daily Jacky Kindle, MD       heparin injection 5,000 Units  5,000 Units Subcutaneous Q8H Mick Sell, PA-C   5,000 Units at 06/25/22 0615   insulin aspart (novoLOG) injection 0-6 Units  0-6 Units Subcutaneous Q4H Andres Labrum D, PA-C   1 Units at 06/25/22 0842   ipratropium-albuterol (DUONEB) 0.5-2.5 (3) MG/3ML nebulizer solution 3 mL  3 mL Nebulization Q6H PRN Mick Sell, PA-C       midodrine (PROAMATINE) tablet 20 mg  20 mg Oral TID WC Gleason, Otilio Carpen, PA-C   20 mg at 06/25/22 0842   mometasone-formoterol (DULERA) 100-5 MCG/ACT inhaler 2 puff  2 puff Inhalation BID Mick Sell, PA-C   2 puff at 06/25/22 0807   norepinephrine (LEVOPHED) 4mg  in 221mL (0.016 mg/mL) premix infusion  2-10 mcg/min Intravenous Titrated Andres Labrum D, PA-C 7.5 mL/hr at 06/25/22 0700 2 mcg/min at 06/25/22 0700   Oral care mouth rinse  15  mL Mouth Rinse PRN Rigoberto Noel, MD       pantoprazole (PROTONIX) EC tablet 40 mg  40 mg Oral Daily Mick Sell, PA-C   40 mg at 06/24/22 0930   polyethylene glycol (MIRALAX / GLYCOLAX) packet 17 g  17 g Oral Daily PRN Mick Sell, PA-C          Review of Systems: 10 systems reviewed and negative except per interval history/subjective  Physical Exam: Vitals:   06/25/22 0740 06/25/22 0807  BP:    Pulse:    Resp:    Temp: (!) 97.4 F (36.3 C)   SpO2:  95%   No intake/output data recorded.  Intake/Output Summary (Last 24 hours) at 06/25/2022 0846 Last data filed at 06/25/2022 0700 Gross per 24 hour  Intake 916.69 ml  Output --  Net 916.69 ml   Constitutional: well-appearing, no  acute distress ENMT: ears and nose without scars or lesions, MMM CV: normal rate, trace edema in the bilateral lower extremities Respiratory: Bilateral chest rise, normal work of breathing Gastrointestinal: soft, non-tender, no palpable masses or hernias Skin: no visible lesions or rashes Psych: alert, judgement/insight appropriate, appropriate mood and affect   Test Results I personally reviewed new and old clinical labs and radiology tests Lab Results  Component Value Date   NA 137 06/25/2022   K 4.8 06/25/2022   CL 94 (L) 06/25/2022   CO2 27 06/25/2022   BUN 47 (H) 06/25/2022   CREATININE 7.78 (H) 06/25/2022   CALCIUM 6.2 (LL) 06/25/2022   ALBUMIN 3.4 (L) 06/24/2022   PHOS 6.1 (H) 06/25/2022    CBC Recent Labs  Lab 06/23/22 1751 06/23/22 1843 06/23/22 2100 06/24/22 0500 06/25/22 0615  WBC 11.0*  --  16.3* 14.6* 8.3  NEUTROABS 7.5  --   --   --   --   HGB 12.9   < > 12.5 12.5 11.7*  HCT 40.0   < > 38.1 37.4 37.1  MCV 93.9  --  93.8 90.8 95.4  PLT 163  --  196 184 170   < > = values in this interval not displayed.

## 2022-06-25 NOTE — Plan of Care (Signed)
  Problem: Education: Goal: Knowledge of disease and its progression will improve Outcome: Completed/Met   Problem: Fluid Volume: Goal: Compliance with measures to maintain balanced fluid volume will improve Outcome: Completed/Met   Problem: Health Behavior/Discharge Planning: Goal: Ability to manage health-related needs will improve Outcome: Completed/Met   Problem: Nutritional: Goal: Ability to make healthy dietary choices will improve Outcome: Completed/Met   Problem: Clinical Measurements: Goal: Complications related to the disease process, condition or treatment will be avoided or minimized Outcome: Completed/Met

## 2022-06-25 NOTE — Progress Notes (Signed)
NAME:  [name], MRN:  [id number], DOB:  Jul 15, 1961, LOS: 2 ADMISSION DATE:  06/23/2022, CONSULTATION DATE:  06/25/2022  REFERRING MD:  Reather Converse, EDP, CHIEF COMPLAINT: hypotension  History of Present Illness:  61 year old with ESRD on dialysis for many years, underwent her usual session of 4.5 hours of dialysis.  Her weight going and was 109 and weight postdialysis was 103 pounds, her dry weight is 105 kg.  She was BIBEMS from home for generalized weakness and near syncope, found to be hypotensive with initial blood pressure 56 systolic, given 147 normal saline in route and started on epi drip by EMS at 4 mics with blood pressure 88/54 on arrival to ED. she was given another 500 cc of fluid and epi was titrated to 8 mics to obtain blood pressure in the systolic 90 range.  Blood pressure was checked on multiple sites. Bedside echo by EDP showed normal LV function and no effusion.  She reports mild generalized headache .  No fevers, chest pain, shortness of breath  Pertinent  Medical History  -ESRD on HD-TTS , dry weight 105 pounds -Difficult to access currently being dialyzed via left femoral AV graft with several revisions -SVC syndrome status post stent -Chronic hypotension on midodrine -"COPD", never smoker -OSA on CPAP -Diabetes type 2 on insulin  Significant Hospital Events: Including procedures, antibiotic start and stop dates in addition to other pertinent events   7/15 admit to PCCM with hypotension 7/16 feeling improved, remains on Levophed 80mcg, afebrile   Interim History / Subjective:  Patient states that she overall feels improved this morning and has no complaints.  Objective   Blood pressure 123/79, pulse 62, temperature (!) 97.4 F (36.3 C), temperature source Axillary, resp. rate 11, height 5\' 1"  (1.549 m), weight 80.9 kg, SpO2 95 %.        Intake/Output Summary (Last 24 hours) at 06/25/2022 0847 Last data filed at 06/25/2022 0700 Gross per 24 hour  Intake 916.69  ml  Output --  Net 916.69 ml    Filed Weights   06/23/22 2212 06/24/22 2042 06/25/22 0421  Weight: 83.9 kg 80.9 kg 80.9 kg   Constitutional:Sitting upright in bed eating breakfast, in no acute distress. Cardio:Regular rate and rhythm. No murmurs, rubs, or gallops. Pulm:Clear to auscultation bilaterally. Normal work of breathing on room air. Abdomen:Soft, nontender, nondistended. WGN:FAOZHYQM for extremity edema. Skin:Warm and dry. Neuro:Alert, oriented, no focal deficit noted. Psych:Normal mood and affect.  Resolved Hospital Problem list     Assessment & Plan:   Acute on chronic hypotension Patient is stable this morning, was able to titrate down to 1 mcg of levophed however had to be increased again to 2 mcg. She also received 1L IVF overnight and BP has responded appropriately to these interventions. Procalcitonin is increased to 2.83 today, from 0.87 on admission but she exhibits no signs of infection and with her ESRD this may not be accurate; further, leukocytosis has resolved. Given clinical improvement I suspect her presenting symptoms were 2/2 overdiuresis rather than infectious etiology. Echocardiogram was largely unremarkable. -Titrate levophed as tolerated; MAP goal >65, SBP goal > 90 -Continue midodrine 20 mg TID -Follow blood cultures -If new fever, start empiric antibiotics  ESRD Hyperkalemia, resolved Hypocalcemia  Hyperphosphatemia Nephrology consulted and will make adjustments on their end for new dry weight, which they plan to increase. She will have HD today but no fluid will be removed. Ca remains low at 6.2, phosphorous acutely elevated to 6.1. -Nephrology consulted, appreciate their  assistance -HD today -Ca repleted -Trend BMP  OSA -resume home CPAP Stable. -CPAP nightly -Continue Advair  Best Practice (right click and "Reselect all SmartList Selections" daily)   Diet/type: Regular consistency (see orders) DVT prophylaxis: prophylactic heparin   GI prophylaxis: N/A Lines: N/A Foley:  N/A Code Status:  full code Last date of multidisciplinary goals of care discussion [NA] patient and husband updated at bedside 7/16  Labs   CBC: Recent Labs  Lab 06/23/22 1751 06/23/22 1843 06/23/22 2100 06/24/22 0500 06/25/22 0615  WBC 11.0*  --  16.3* 14.6* 8.3  NEUTROABS 7.5  --   --   --   --   HGB 12.9 13.9 12.5 12.5 11.7*  HCT 40.0 41.0 38.1 37.4 37.1  MCV 93.9  --  93.8 90.8 95.4  PLT 163  --  196 184 170     Basic Metabolic Panel: Recent Labs  Lab 06/23/22 1751 06/23/22 1843 06/23/22 2100 06/24/22 0500 06/24/22 0923 06/24/22 1444 06/25/22 0615  NA 135 134*  --  134* 137 136 137  K 4.5 4.4  --  6.3* 5.9* 4.6 4.8  CL 92*  --   --  93* 98 94* 94*  CO2 27  --   --  24 20* 22 27  GLUCOSE 306*  --   --  213* 160* 198* 165*  BUN 29*  --   --  38* 42* 44* 47*  CREATININE 5.54*  --  5.67* 6.38* 6.80* 6.88* 7.78*  CALCIUM 6.6*  --   --  6.4* 7.1* 6.6* 6.2*  MG  --   --  1.7  --   --   --  1.9  PHOS  --   --  4.2  --   --   --  6.1*    GFR: Estimated Creatinine Clearance: 7.3 mL/min (A) (by C-G formula based on SCr of 7.78 mg/dL (H)). Recent Labs  Lab 06/23/22 1751 06/23/22 2100 06/24/22 0500 06/25/22 0615  PROCALCITON  --  0.87  --  2.83  WBC 11.0* 16.3* 14.6* 8.3  LATICACIDVEN 2.7* 3.0* 1.1  --      Liver Function Tests: Recent Labs  Lab 06/24/22 0500  AST 13*  ALT 17  ALKPHOS 127*  BILITOT 0.6  PROT 7.3  ALBUMIN 3.4*    No results for input(s): "LIPASE", "AMYLASE" in the last 168 hours. No results for input(s): "AMMONIA" in the last 168 hours.  ABG    Component Value Date/Time   PHART 7.322 (L) 03/04/2021 0236   PCO2ART 56.1 (H) 03/04/2021 0236   PO2ART 80.4 (L) 03/04/2021 0236   HCO3 30.7 (H) 06/23/2022 1843   TCO2 32 06/23/2022 1843   ACIDBASEDEF 1.2 08/10/2014 1252   O2SAT 78 06/23/2022 1843     Coagulation Profile: No results for input(s): "INR", "PROTIME" in the last 168  hours.  Cardiac Enzymes: No results for input(s): "CKTOTAL", "CKMB", "CKMBINDEX", "TROPONINI" in the last 168 hours.  HbA1C: Hgb A1c MFr Bld  Date/Time Value Ref Range Status  06/23/2022 09:00 PM 8.2 (H) 4.8 - 5.6 % Final    Comment:    (NOTE) Pre diabetes:          5.7%-6.4%  Diabetes:              >6.4%  Glycemic control for   <7.0% adults with diabetes   03/04/2021 03:34 AM 5.4 4.8 - 5.6 % Final    Comment:    (NOTE) Pre diabetes:  5.7%-6.4%  Diabetes:              >6.4%  Glycemic control for   <7.0% adults with diabetes     CBG: Recent Labs  Lab 06/24/22 1521 06/24/22 1927 06/24/22 2340 06/25/22 0403 06/25/22 0736  GLUCAP 202* 241* 257* 125* 152*     Review of Systems:   Please see the history of present illness. All other systems reviewed and are negative   Past Medical History:  She,  has a past medical history of Arthritis, Chronic hypotension, Complication of anesthesia (2016), COPD (chronic obstructive pulmonary disease) ([place]), Diabetes mellitus type 2 in obese ([place]), ESRD (end stage renal disease) on dialysis ([place]), GERD (gastroesophageal reflux disease), Iron deficiency anemia, Obesities, morbid ([place]), OSA on CPAP, PE (pulmonary embolism) (~ 2010), Pneumonia, Shortness of breath, and Superior vena cava syndrome.   Surgical History:   Past Surgical History:  Procedure Laterality Date   ARTERIOVENOUS GRAFT PLACEMENT Left ~ 2011   "thigh" (4   CARPAL TUNNEL RELEASE Right ~ 2011   CARPAL TUNNEL RELEASE Right 01/28/2015   Procedure: RIGHT CARPAL TUNNEL RELEASE;  Surgeon: Roseanne Kaufman, MD;  Location: [facility];  Service: Orthopedics;  Laterality: Right;   PARATHYROIDECTOMY  02/1993   REVISION OF ARTERIOVENOUS GORETEX GRAFT Left 08/14/2015   Procedure: REVISION OF ARTERIOVENOUS GORETEX GRAFT LEFT THIGH with  REMOVAL of segment of infected graft.;  Surgeon: Angelia Mould, MD;  Location: [facility];  Service: Vascular;  Laterality: Left;   REVISION  OF ARTERIOVENOUS GORETEX GRAFT Left 03/03/2021   Procedure: REVISION OF LEFT THIGH ARTERIOVENOUS GORETEX GRAFT;  Surgeon: Elam Dutch, MD;  Location: [facility];  Service: Vascular;  Laterality: Left;   THROMBECTOMY AND REVISION OF ARTERIOVENTOUS (AV) GORETEX  GRAFT Left ~ 12/2012   "thigh"    VASCULAR SURGERY       Social History:   reports that she has never smoked. She has never used smokeless tobacco. She reports that she does not drink alcohol and does not use drugs.   Family History:  Her family history includes Diabetes Mellitus II in her father and mother; Hypertension in her father and mother.   Allergies Allergies  Allergen Reactions   Amoxicillin Anaphylaxis and Other (See Comments)    Throat closes and gets sweaty.   Penicillins Anaphylaxis    Throat closes and gets sweaty   Iohexol Other (See Comments)     Desc: scratchy/itchy throat/itching on back / nausea    Ceftazidime Nausea And Vomiting     Home Medications  Prior to Admission medications   Medication Sig Start Date End Date Taking? Authorizing Provider  acetaminophen (TYLENOL) 500 MG tablet Take 500 mg by mouth every 6 (six) hours as needed for moderate pain or headache.    [provider]  Alcohol Swabs (ALCOHOL PADS) 70 % PADS SMARTSIG:2 Topical Twice Daily 04/14/21   [provider]  ATROVENT HFA 17 MCG/ACT inhaler Inhale 1 puff into the lungs every 6 (six) hours as needed for wheezing. 01/03/16   [provider]  brimonidine (ALPHAGAN) 0.2 % ophthalmic solution Place 1 drop into both eyes 3 (three) times daily.    [provider]  DIPHENHYDRAMINE HCL PO Take by mouth. 06/24/21 06/23/22  [provider]  dorzolamide-timolol (COSOPT) 22.3-6.8 MG/ML ophthalmic solution Place 1 drop into both eyes 2 (two) times daily.    [provider]  doxycycline (VIBRA-TABS) 100 MG tablet Take 100 mg by mouth 2 (two) times daily. 02/27/21  [provider]  EASY  COMFORT INSULIN SYRINGE 31G X 5/16" 1 ML [place]  04/14/21   [provider]  Easy Comfort Lancets [place]  04/14/21   [provider]  ferric citrate (AURYXIA) 1 GM 210 MG(Fe) tablet Take 630 mg by mouth 3 (three) times daily with meals.    [provider]  Ferrous Sulfate (IRON PO) Take 1 tablet by mouth daily.    [provider]  Fluticasone-Salmeterol (ADVAIR) 100-50 MCG/DOSE AEPB Inhale 1 puff into the lungs 2 (two) times daily as needed (shortness of breath).    [provider]  heparin 1000 unit/mL SOLN injection Heparin Sodium (Porcine) 1,000 Units/mL Systemic 08/03/21 08/02/22  [provider]  insulin aspart (NOVOLOG) 100 UNIT/ML injection Inject 10-25 Units into the skin 2 (two) times daily. Per sliding scale  Takes in am and evening meal    [provider]  latanoprost (XALATAN) 0.005 % ophthalmic solution Place 1 drop into both eyes at bedtime.    [provider]  meloxicam (MOBIC) 15 MG tablet Take 15 mg by mouth daily. Patient not taking: Reported on 10/11/2021 07/20/21   [provider]  metoCLOPramide (REGLAN) 5 MG tablet Take 5 mg by mouth at bedtime.    [provider]  midodrine (PROAMATINE) 10 MG tablet Take 1 tablet (10 mg total) by mouth 3 (three) times daily with meals. Patient taking differently: Take 10 mg by mouth in the morning and at bedtime. 08/18/15   Cristal Ford, DO  Multiple Vitamin (MULTIVITAMIN WITH MINERALS) TABS tablet Take 1 tablet by mouth daily.    [provider]  [facility] VERIO test strip 1 each 4 (four) times daily. 04/14/21   [provider]  predniSONE (DELTASONE) 20 MG tablet Take by mouth. 07/27/21   [provider]  traMADol (ULTRAM) 50 MG tablet Take 50 mg by mouth 3 (three) times daily. 03/04/21   [provider]     Critical care time: 35 minutes     CRITICAL CARE Performed by: Farrel Gordon

## 2022-06-25 NOTE — Progress Notes (Signed)
Pt receives out-pt HD at [facility] on TTS. Pt arrives at 5:00 for 5:15 chair time. Will assist as needed.   Melven Sartorius Renal Navigator [phone]

## 2022-06-25 NOTE — TOC Initial Note (Signed)
Transition of Care [facility]) - Initial/Assessment Note    Patient Details  Name: [name] MRN: [id number] Date of Birth: [birth year]  Transition of Care [facility]) CM/SW Contact:    Tom-Johnson, Renea Ee, RN Phone Number: 06/25/2022, 5:12 PM  Clinical Narrative:                  CM spoke with patient at bedside about needs for post hospital transition. Admitted for Hypotension, on Midodrine.  From home with husband and sister. Has two supportive daughters. On disability. Has scheduled dialysis days on TTS, husband transports. Has a cane, walker, CPAP at home. No on home O2, currently on 2L. Has a PCS aide five days/week for 2 hrs each day from [facility] care. PCP is Elwyn Reach, MD and uses [facility] on [address].     No TOC needs or recommendations noted at this time. CM will continue to follow as patient progresses with care.    Barriers to Discharge: Continued Medical Work up   Patient Goals and CMS Choice Patient states their goals for this hospitalization and ongoing recovery are:: To return home CMS Medicare.gov Compare Post Acute Care list provided to:: Patient    Expected Discharge Plan and Services     Discharge Planning Services: CM Consult   Living arrangements for the past 2 months: Single Family Home                                      Prior Living Arrangements/Services Living arrangements for the past 2 months: Single Family Home Lives with:: Spouse, Siblings (Sister) Patient language and need for interpreter reviewed:: Yes Do you feel safe going back to the place where you live?: Yes      Need for Family Participation in Patient Care: Yes (Comment) Care giver support system in place?: Yes (comment) Current home services: DME (CPAP, cane, walker) Criminal Activity/Legal Involvement Pertinent to Current Situation/Hospitalization: No - Comment as needed  Activities of Daily Living Home Assistive Devices/Equipment: Blood  pressure cuff, Cane (specify quad or straight), Walker (specify type), CBG Meter ADL Screening (condition at time of admission) Patient's cognitive ability adequate to safely complete daily activities?: Yes Is the patient deaf or have difficulty hearing?: No Does the patient have difficulty seeing, even when wearing glasses/contacts?: No Does the patient have difficulty concentrating, remembering, or making decisions?: No Patient able to express need for assistance with ADLs?: Yes Does the patient have difficulty dressing or bathing?: No Independently performs ADLs?: Yes (appropriate for developmental age) Does the patient have difficulty walking or climbing stairs?: No Weakness of Legs: Both Weakness of Arms/Hands: None  Permission Sought/Granted Permission sought to share information with : Case Manager, Family Supports Permission granted to share information with : Yes, Verbal Permission Granted              Emotional Assessment Appearance:: Appears stated age Attitude/Demeanor/Rapport: Engaged, Gracious Affect (typically observed): Accepting, Appropriate, Calm, Hopeful Orientation: : Oriented to Self, Oriented to Place, Oriented to  Time, Oriented to Situation Alcohol / Substance Use: Not Applicable Psych Involvement: No (comment)  Admission diagnosis:  Hyponatremia [E87.1] Hyperglycemia [R73.9] ESRD (end stage renal disease) on dialysis ([place]) [N18.6, Z99.2] Hypotension [I95.9] Hypotension, unspecified hypotension type [I95.9] Patient Active Problem List   Diagnosis Date Noted   Hypotension 06/23/2022   Other disorders of plasma-protein metabolism, not elsewhere classified 04/24/2021   Pain, unspecified 03/06/2021  ESRD (end stage renal disease) on dialysis ([place]) 03/03/2021   Arteriovenous graft removed 03/03/2021   Unspecified open wound, left thigh, initial encounter 02/23/2021   Other disorders of phosphorus metabolism 11/20/2019   Combined form of age-related  cataract, left eye 09/08/2019   Combined form of age-related cataract, right eye 09/08/2019   Optic neuropathy, bilateral 09/08/2019   Primary open angle glaucoma (POAG) of left eye, indeterminate stage 09/08/2019   Primary open angle glaucoma (POAG) of right eye, indeterminate stage 09/08/2019   Fluid overload, unspecified 10/04/2015   Bacteremia due to Staphylococcus    Infection and inflammatory reaction due to cardiac device, implant, and graft ([place])    Staphylococcus aureus bacteremia with sepsis ([place])    Severe sepsis with septic shock ([place])    Screen for STD (sexually transmitted disease)    Pressure ulcer 08/14/2015   Sepsis ([place]) 08/12/2015   SIRS (systemic inflammatory response syndrome) ([place]) 08/12/2015   Diabetes mellitus type 2 in obese ([place]) 08/12/2015   OSA on CPAP 08/12/2015   Chronic hypotension 08/12/2015   COPD (chronic obstructive pulmonary disease) ([place]) 08/12/2015   Blood poisoning    Encounter for removal of sutures 10/23/2014   Coagulation defect, unspecified ([place]) 08/18/2014   Hypercapnia 08/12/2014   Acute pulmonary edema ([place]) 08/12/2014   Acute respiratory failure with hypoxia ([place]) 08/10/2014   COPD with acute exacerbation ([place]) 03/25/2014   Pruritus, unspecified 11/02/2013   Shortness of breath 10/31/2013   Hypercalcemia 07/25/2013   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   Fever and chills 03/26/2013   Cough 03/26/2013   Diarrhea 03/26/2013   ESRD on hemodialysis ([place]) 03/26/2013   OSA (obstructive sleep apnea) 03/26/2013   Anemia of chronic disease 03/26/2013   Secondary hyperparathyroidism ([place]) 03/26/2013   Insulin-requiring or dependent type II diabetes mellitus ([place]) 03/26/2013   Allergy, unspecified, initial encounter 10/10/2012   Body mass index (BMI) 45.0-49.9, adult ([place]) 07/21/2012   Iron deficiency anemia, unspecified 08/03/2010   Other long term (current) drug therapy 12/09/2009   Radiographic dye allergy status  12/09/2009   Contact with and (suspected) exposure to potentially hazardous body fluids 12/09/2009   Morbid (severe) obesity due to excess calories ([place]) 12/09/2009   Personal history of benign neoplasm of brain 12/09/2009   Personal history of Methicillin resistant Staphylococcus aureus infection 12/09/2009   Personal history of other venous thrombosis and embolism 12/09/2009   Gastro-esophageal reflux disease without esophagitis 11/13/2006   Other specified complication of vascular prosthetic devices, implants and grafts, subsequent encounter 11/13/2006   Effusion, unspecified shoulder 09/07/2006   Carpal tunnel syndrome, unspecified upper limb 12/14/1998   Hypertensive chronic kidney disease with stage 5 chronic kidney disease or end stage renal disease ([place]) 12/14/1998   Renal sclerosis, unspecified 12/14/1998   Status post tubal ligation 12/14/1998   Dependence on renal dialysis ([place]) 12/10/1992   PCP:  Elwyn Reach, MD Pharmacy:   [facility] [place], [place] - [phone] [place] [address] Phone: [phone] Fax: [phone]  [facility] [place], [place] - [address] [employer] [address], [address] [place] [address] Phone: [phone] Fax: [phone]  [facility] [address] [place] [address] Phone: [phone] Fax: [phone]     Social Determinants of Health (SDOH) Interventions    Readmission Risk Interventions     No data to display

## 2022-06-25 NOTE — Progress Notes (Signed)
Received patient in bed, alert and oriented. Informed consent signed and in chart.  Time tx completed: 3.0 hrs  HD treatment completed. Patient tolerated well. Fistula/Graft/HD catheter without signs and symptoms of complications. Patient transported back to the room, alert and orient and in no acute distress. Report given to bedside RN.  Total UF removed: None  Medication given: None  Post HD VS: See chart  Post HD weight: 80.9 kg

## 2022-06-26 ENCOUNTER — Other Ambulatory Visit (HOSPITAL_COMMUNITY): Payer: Self-pay

## 2022-06-26 DIAGNOSIS — I953 Hypotension of hemodialysis: Secondary | ICD-10-CM | POA: Diagnosis not present

## 2022-06-26 DIAGNOSIS — N186 End stage renal disease: Secondary | ICD-10-CM | POA: Diagnosis not present

## 2022-06-26 DIAGNOSIS — Z992 Dependence on renal dialysis: Secondary | ICD-10-CM | POA: Diagnosis not present

## 2022-06-26 LAB — CBC
HCT: 34.1 % — ABNORMAL LOW (ref 36.0–46.0)
Hemoglobin: 11.1 g/dL — ABNORMAL LOW (ref 12.0–15.0)
MCH: 30.5 pg (ref 26.0–34.0)
MCHC: 32.6 g/dL (ref 30.0–36.0)
MCV: 93.7 fL (ref 80.0–100.0)
Platelets: 177 10*3/uL (ref 150–400)
RBC: 3.64 MIL/uL — ABNORMAL LOW (ref 3.87–5.11)
RDW: 15.7 % — ABNORMAL HIGH (ref 11.5–15.5)
WBC: 9 10*3/uL (ref 4.0–10.5)
nRBC: 0 % (ref 0.0–0.2)

## 2022-06-26 LAB — RENAL FUNCTION PANEL
Albumin: 2.7 g/dL — ABNORMAL LOW (ref 3.5–5.0)
Anion gap: 10 (ref 5–15)
BUN: 14 mg/dL (ref 8–23)
CO2: 25 mmol/L (ref 22–32)
Calcium: 6.5 mg/dL — ABNORMAL LOW (ref 8.9–10.3)
Chloride: 102 mmol/L (ref 98–111)
Creatinine, Ser: 3.06 mg/dL — ABNORMAL HIGH (ref 0.44–1.00)
GFR, Estimated: 17 mL/min — ABNORMAL LOW (ref >60)
Glucose, Bld: 93 mg/dL (ref 70–99)
Phosphorus: 1.9 mg/dL — ABNORMAL LOW (ref 2.5–4.6)
Potassium: 3.2 mmol/L — ABNORMAL LOW (ref 3.5–5.1)
Sodium: 137 mmol/L (ref 135–145)

## 2022-06-26 LAB — GLUCOSE, CAPILLARY
Glucose-Capillary: 199 mg/dL — ABNORMAL HIGH (ref 70–99)
Glucose-Capillary: 136 mg/dL — ABNORMAL HIGH (ref 70–99)
Glucose-Capillary: 124 mg/dL — ABNORMAL HIGH (ref 70–99)
Glucose-Capillary: 108 mg/dL — ABNORMAL HIGH (ref 70–99)

## 2022-06-26 LAB — HEPATITIS B SURFACE ANTIBODY, QUANTITATIVE: Hep B S AB Quant (Post): 42.4 m[IU]/mL (ref >9.9)

## 2022-06-26 MED ORDER — HEPARIN SODIUM (PORCINE) 1000 UNIT/ML IJ SOLN
INTRAMUSCULAR | Status: AC
  Administered 2022-06-26: 1500 [IU] via INTRAVENOUS_CENTRAL
  Filled 2022-06-26: qty 2

## 2022-06-26 MED ORDER — MIDODRINE HCL 10 MG PO TABS
20.0000 mg | ORAL_TABLET | Freq: Three times a day (TID) | ORAL | 2 refills | Status: AC
  Filled 2022-06-26: qty 180, 30d supply, fill #0

## 2022-06-26 NOTE — Progress Notes (Signed)
[facility] Progress Note   Subjective:    Seen and examined patient on HD. Currently running even and tolerating well. BP stable. No acute issues. Levo off.  Objective Vitals:   06/26/22 0840 06/26/22 0905 06/26/22 0939 06/26/22 1000  BP: (!) 90/52 (!) 98/57 (!) 110/59 (!) 105/92  Pulse: 68 60 (!) 58 60  Resp: 18 15 17 11   Temp: 98.6 F (37 C)     TempSrc:      SpO2: 99% 100% 100% 97%  Weight:      Height:       Physical Exam General: Chronically ill-appearing; 2L Villanueva; NAD Heart: S1 and S2; No murmurs, gallops, or rubs Lungs: Clear anteriorly; No wheezing, rales, or rhonchi Abdomen: Soft and non-tender Extremities: No edema BLLE Dialysis Access: L thigh AVG   Filed Weights   06/25/22 0909 06/25/22 1238 06/26/22 0838  Weight: 80.9 kg 80.9 kg 107 kg    Intake/Output Summary (Last 24 hours) at 06/26/2022 1045 Last data filed at 06/26/2022 0800 Gross per 24 hour  Intake 395.23 ml  Output 0 ml  Net 395.23 ml    Additional Objective Labs: Basic Metabolic Panel: Recent Labs  Lab 06/23/22 2100 06/24/22 0500 06/24/22 1444 06/25/22 0615 06/25/22 1554  NA  --    < > 136 137 136  K  --    < > 4.6 4.8 5.5*  CL  --    < > 94* 94* 95*  CO2  --    < > 22 27 29   GLUCOSE  --    < > 198* 165* 229*  BUN  --    < > 44* 47* 24*  CREATININE 5.67*   < > 6.88* 7.78* 4.73*  CALCIUM  --    < > 6.6* 6.2* 7.2*  PHOS 4.2  --   --  6.1* 3.5   < > = values in this interval not displayed.   Liver Function Tests: Recent Labs  Lab 06/24/22 0500  AST 13*  ALT 17  ALKPHOS 127*  BILITOT 0.6  PROT 7.3  ALBUMIN 3.4*   No results for input(s): "LIPASE", "AMYLASE" in the last 168 hours. CBC: Recent Labs  Lab 06/23/22 1751 06/23/22 1843 06/23/22 2100 06/24/22 0500 06/25/22 0615  WBC 11.0*  --  16.3* 14.6* 8.3  NEUTROABS 7.5  --   --   --   --   HGB 12.9   < > 12.5 12.5 11.7*  HCT 40.0   < > 38.1 37.4 37.1  MCV 93.9  --  93.8 90.8 95.4  PLT 163  --  196 184  170   < > = values in this interval not displayed.   Blood Culture    Component Value Date/Time   SDES BLOOD LEFT FOREARM 06/23/2022 2059   SPECREQUEST  06/23/2022 2059    BOTTLES DRAWN AEROBIC AND ANAEROBIC Blood Culture adequate volume   CULT  06/23/2022 2059    NO GROWTH 2 DAYS Performed at [facility], [place] [address] [place], [place] [address]    REPTSTATUS PENDING 06/23/2022 2059    Cardiac Enzymes: No results for input(s): "CKTOTAL", "CKMB", "CKMBINDEX", "TROPONINI" in the last 168 hours. CBG: Recent Labs  Lab 06/25/22 1535 06/25/22 1904 06/26/22 0106 06/26/22 0436 06/26/22 0747  GLUCAP 219* 275* 199* 136* 124*   Iron Studies: No results for input(s): "IRON", "TIBC", "TRANSFERRIN", "FERRITIN" in the last 72 hours. Lab Results  Component Value Date   INR 1.2 03/03/2021  INR 1.28 08/12/2015   INR 1.10 03/26/2013   Studies/Results: ECHOCARDIOGRAM COMPLETE  Result Date: 06/24/2022    ECHOCARDIOGRAM REPORT   Patient Name:   [name] Date of Exam: 06/24/2022 Medical Rec #:  [id number]       Height:       61.0 in Accession #:    [id number]      Weight:       184.0 lb Date of Birth:  04/23/1961       BSA:          1.823 m Patient Age:    61 years        BP:           75/55 mmHg Patient Gender: F               HR:           82 bpm. Exam Location:  Inpatient Procedure: 2D Echo, Cardiac Doppler, Color Doppler and Intracardiac            Opacification Agent Indications:    pulmonary htn  History:        Patient has no prior history of Echocardiogram examinations.  Sonographer:    DV Referring Phys: [id number] Mick Sell  Sonographer Comments: Suboptimal apical window. IMPRESSIONS  1. Left ventricular ejection fraction, by estimation, is 70 to 75%. The left ventricle has hyperdynamic function. The left ventricle has no regional wall motion abnormalities. Left ventricular diastolic parameters are consistent with Grade I diastolic dysfunction (impaired relaxation).  2. Right  ventricular systolic function is normal. The right ventricular size is normal. Tricuspid regurgitation signal is inadequate for assessing PA pressure.  3. The mitral valve is grossly normal. Trivial mitral valve regurgitation. No evidence of mitral stenosis.  4. The aortic valve is tricuspid. There is mild calcification of the aortic valve. There is mild thickening of the aortic valve. Aortic valve regurgitation is not visualized. Aortic valve sclerosis/calcification is present, without any evidence of aortic stenosis.  5. The inferior vena cava is normal in size with greater than 50% respiratory variability, suggesting right atrial pressure of 3 mmHg. Comparison(s): No prior Echocardiogram. FINDINGS  Left Ventricle: Left ventricular ejection fraction, by estimation, is 70 to 75%. The left ventricle has hyperdynamic function. The left ventricle has no regional wall motion abnormalities. Definity contrast agent was given IV to delineate the left ventricular endocardial borders. The left ventricular internal cavity size was normal in size. There is no left ventricular hypertrophy. Left ventricular diastolic parameters are consistent with Grade I diastolic dysfunction (impaired relaxation). Right Ventricle: The right ventricular size is normal. No increase in right ventricular wall thickness. Right ventricular systolic function is normal. Tricuspid regurgitation signal is inadequate for assessing PA pressure. Left Atrium: Left atrial size was normal in size. Right Atrium: Right atrial size was normal in size. Pericardium: There is no evidence of pericardial effusion. Mitral Valve: The mitral valve is grossly normal. There is mild thickening of the mitral valve leaflet(s). There is mild calcification of the mitral valve leaflet(s). Trivial mitral valve regurgitation. No evidence of mitral valve stenosis. Tricuspid Valve: The tricuspid valve is normal in structure. Tricuspid valve regurgitation is trivial. Aortic Valve:  The aortic valve is tricuspid. There is mild calcification of the aortic valve. There is mild thickening of the aortic valve. Aortic valve regurgitation is not visualized. Aortic valve sclerosis/calcification is present, without any evidence of aortic stenosis. Aortic valve mean gradient measures 6.0 mmHg. Aortic valve peak gradient  measures 10.5 mmHg. Aortic valve area, by VTI measures 2.74 cm. Pulmonic Valve: The pulmonic valve was normal in structure. Pulmonic valve regurgitation is not visualized. Aorta: The aortic root and ascending aorta are structurally normal, with no evidence of dilitation. Venous: The inferior vena cava is normal in size with greater than 50% respiratory variability, suggesting right atrial pressure of 3 mmHg. IAS/Shunts: The atrial septum is grossly normal.  LEFT VENTRICLE PLAX 2D LVIDd:         3.60 cm   Diastology LVIDs:         2.30 cm   LV e' medial:    4.57 cm/s LV PW:         0.80 cm   LV E/e' medial:  17.8 LV IVS:        0.90 cm   LV e' lateral:   9.90 cm/s LVOT diam:     1.90 cm   LV E/e' lateral: 8.2 LV SV:         78 LV SV Index:   43 LVOT Area:     2.84 cm  RIGHT VENTRICLE             IVC RV S prime:     10.20 cm/s  IVC diam: 2.10 cm TAPSE (M-mode): 1.5 cm LEFT ATRIUM             Index LA diam:        3.10 cm 1.70 cm/m LA Vol (A2C):   20.5 ml 11.24 ml/m LA Vol (A4C):   29.6 ml 16.23 ml/m LA Biplane Vol: 25.3 ml 13.88 ml/m  AORTIC VALVE                     PULMONIC VALVE AV Area (Vmax):    2.49 cm      PV Vmax:       1.28 m/s AV Area (Vmean):   2.39 cm      PV Peak grad:  6.6 mmHg AV Area (VTI):     2.74 cm AV Vmax:           162.00 cm/s AV Vmean:          112.000 cm/s AV VTI:            0.285 m AV Peak Grad:      10.5 mmHg AV Mean Grad:      6.0 mmHg LVOT Vmax:         142.00 cm/s LVOT Vmean:        94.600 cm/s LVOT VTI:          0.275 m LVOT/AV VTI ratio: 0.96  AORTA Ao Root diam: 3.20 cm Ao Asc diam:  3.00 cm MITRAL VALVE MV Area (PHT): 3.21 cm     SHUNTS MV Decel  Time: 236 msec     Systemic VTI:  0.28 m MV E velocity: 81.20 cm/s   Systemic Diam: 1.90 cm MV A velocity: 124.00 cm/s MV E/A ratio:  0.65 Gwyndolyn Kaufman MD Electronically signed by Gwyndolyn Kaufman MD Signature Date/Time: 06/24/2022/3:16:21 PM    Final     Medications:  sodium chloride Stopped (06/25/22 0844)   norepinephrine (LEVOPHED) Adult infusion Stopped (06/25/22 1041)    fludrocortisone  0.1 mg Oral Daily   heparin  5,000 Units Subcutaneous Q8H   insulin aspart  0-6 Units Subcutaneous Q4H   midodrine  20 mg Oral TID WC   mometasone-formoterol  2 puff Inhalation BID   pantoprazole  40 mg Oral  Daily    Dialysis Orders: [facility]  4h  400/500  103.3kg   2K/3Ca bath   P2   Heparin 1500   L thigh AVG - doxercalicferol 2 ug IV tiw w/ hd - last HD 7/15 w/ post wt 102.6kg - last HD 11.8 on 7/11, tsat 48%  Assessment/Plan: Acute on chronic hypotension - post dialysis 7/15. Improved w/ 1.5 L bolus and pressors. Levo now and running HD even today. Echo without significant concern. BCx's neg, no fever.  Likely needs new higher dry weight.   Hyperkalemia -K+ 5.5 today. Suspect this will improve with HD today. ESRD - on HD TTS.  Received dialysis 7/17 and again today. BP - takes midodrine at home 10 mg tid for many years.  Continue medication. Pressors now off Anemia esrd - Hb stable 11.7, no esa needs OSA Obesity  Tobie Poet, NP [facility] 06/26/2022,10:45 AM  LOS: 3 days

## 2022-06-26 NOTE — TOC Transition Note (Signed)
Transition of Care [facility]) - CM/SW Discharge Note   Patient Details  Name: [name] MRN: [id number] Date of Birth: [birth year]  Transition of Care [facility]) CM/SW Contact:  Tom-Johnson, Renea Ee, RN Phone Number: 06/26/2022, 3:43 PM   Clinical Narrative:     Patient is scheduled for discharge today. No TOC needs or recommendations noted. Family to transport at discharge. No further TOC needs noted.   Final next level of care: Home/Self Care Barriers to Discharge: Barriers Resolved   Patient Goals and CMS Choice Patient states their goals for this hospitalization and ongoing recovery are:: To return home CMS Medicare.gov Compare Post Acute Care list provided to:: Patient    Discharge Placement                Patient to be transferred to facility by: Family      Discharge Plan and Services   Discharge Planning Services: CM Consult            DME Arranged: N/A DME Agency: NA       HH Arranged: NA HH Agency: NA        Social Determinants of Health (SDOH) Interventions     Readmission Risk Interventions     No data to display

## 2022-06-26 NOTE — Care Management Important Message (Signed)
Important Message  Patient Details  Name: [name] MRN: [id number] Date of Birth: May 16, 1961   Medicare Important Message Given:  Yes     Aralynn Brake 06/26/2022, 2:40 PM

## 2022-06-26 NOTE — Hospital Course (Signed)
Acute on chronic hypotension Patient presented after experiencing generalized weakness and presyncope after HD. She was hypotensive with SBP 56 per EMS and received a total of 0.5 L NS and started on epinephrine en route to [facility] ED. On arrival she received additional IVF and further titration of epinephrine resulting in improvement of BP to SBP 90's. She was admitted to ICU and started on levophed and given additional IVF resuscitation. Home midodrine was continued at normal dose of 10 mg TID. An echocardiogram was obtained and showed normal LV function without effusion. Due to continued pressor support needs, her home midodrine dose was increased to 20 mg TID. Procalcitonin was increased but she exhibited no signs of infection and with her ESRD it was likely that this may not have been accurate. She had an initial leukocytosis on admission of 16.3 which had resolved by time of discharge, and blood cultures showed no growth. She was ultimately able to tolerate being off of levophed including with dialysis sessions and deemed safe for discharge to home.   ESRD Hyperkalemia Hypocalcemia  Hyperphosphatemia Patient hyperkalemic to 6.3 on HD 1 which was managed with calcium, IV insulin, and lokelma. Nephrology was consulted and assisted in dialysis needs including adjustments in dry weight. Calcium was repleted throughout admission and other electrolytes were managed via dialysis.   OSA Nightly CPAP and Advair continued during admission.

## 2022-06-26 NOTE — Progress Notes (Signed)
  PROGRESS NOTE  Patient admitted after she became hypotensive postdialysis.  She required Levophed on admission and ICU stay.  They were able to titrate midodrine and wean off of vasopressor support.  She was transferred to [facility] service morning of 7/18.  I evaluated patient during dialysis.  She was in good spirits, without any physical complaints.  Blood pressure remained stable.   Patient remains stable for discharge home.   Dessa Phi, DO [facility] 06/26/2022, 1:27 PM  Available via Epic secure chat 7am-7pm After these hours, please refer to coverage provider listed on [email or web address]

## 2022-06-26 NOTE — Progress Notes (Signed)
New Admission Note:  Arrival Method: Wheelchair Mental Orientation: Alert and oriented x4 Telemetry: Box 16 Assessment: Completed Skin: Warm and dry IV: NSLs Pain: Denies Tubes: N/A Safety Measures: Safety Fall Prevention Plan initiated.  Admission: Completed 5 M  Orientation: Patient has been orientated to the room, unit and the staff. Welcome booklet given.  Family: None  Orders have been reviewed and implemented. Will continue to monitor the patient. Call light has been placed within reach and bed alarm has been activated.   Sima Matas BSN, RN  Phone Number: [phone]

## 2022-06-26 NOTE — Progress Notes (Signed)
D/C order noted. Contacted [facility] to advise clinic of pt's d/c today and that pt should resume care on Thursday.   Vianne Grieshop Renal Navigator [phone] 

## 2022-06-26 NOTE — Procedures (Signed)
HD note:  Patient tolerated treatment well. No complaints of discomfort. Ordered to keep even with fluid during treatment . She completed 3 hours of HD.

## 2022-06-26 NOTE — Discharge Summary (Signed)
Physician Discharge Summary         Patient ID: [name] MRN: [id number] DOB/AGE: May 27, 1961 61 y.o.  Admit date: 06/23/2022 Discharge date: 06/26/2022  Discharge Diagnoses:    Active Hospital Problems   Diagnosis Date Noted   Hypotension 06/23/2022    Resolved Hospital Problems  No resolved problems to display.      Discharge summary    Acute on chronic hypotension Patient presented after experiencing generalized weakness and presyncope after HD. She was hypotensive with SBP 56 per EMS and received a total of 0.5 L NS and started on epinephrine en route to [facility] ED. On arrival she received additional IVF and further titration of epinephrine resulting in improvement of BP to SBP 90's. She was admitted to ICU and started on levophed and given additional IVF resuscitation. Home midodrine was continued at normal dose of 10 mg TID. An echocardiogram was obtained and showed normal LV function without effusion. Due to continued pressor support needs, her home midodrine dose was increased to 20 mg TID. Procalcitonin was increased but she exhibited no signs of infection and with her ESRD it was likely that this may not have been accurate. She had an initial leukocytosis on admission of 16.3 which had resolved by time of discharge, and blood cultures showed no growth. She was ultimately able to tolerate being off of levophed including with dialysis sessions and deemed safe for discharge to home.   ESRD Hyperkalemia Hypocalcemia  Hyperphosphatemia Patient hyperkalemic to 6.3 on HD 1 which was managed with calcium, IV insulin, and lokelma. Nephrology was consulted and assisted in dialysis needs including adjustments in dry weight. Calcium was repleted throughout admission and other electrolytes were managed via dialysis.   OSA Nightly CPAP and Advair continued during admission.  Discharge Plan by Active Problems    Acute on chronic hypotension Continue new, increased dose of midodrine  20 mg TID.  ESRD Hyperkalemia Hypocalcemia Hyperphosphatemia Recommend close monitoring of electrolytes with HD sessions; management per nephrology.  Significant Hospital tests/ studies  Echocardiogram showed normal LV function without effusion. Procalcitonin 0.87>2.83 Lactic acid 2.7>3.0>1.1 Leukocytosis 11.0>16.3>14.6>8.3>9.0 K 4.5>4.4>6.3>5.9>4.6>4.8>5.5 Anion gap 17>19>20>16>12 Calcium 6.6>6.4>7.1>6.6>6.2>7.2 BNP 44.0 HbA1c 8.2% Troponin 21>27  Procedures   None  Culture data/antimicrobials   Blood cultures at time of discharge show no growth at 3 days.   Consults  Nephrology    Discharge Exam: BP (!) 102/56 (BP Location: Right Arm)   Pulse 62   Temp 98.2 F (36.8 C)   Resp 18   Ht 5\' 1"  (1.549 m)   Wt 109 kg   SpO2 98%   BMI 45.40 kg/m   Constitutional:Sitting at bedside enjoying a slice of cake, in no acute distress. Cardio:Regular rate and rhythm. No murmurs, rubs, or gallops. Pulm:Clear to auscultation bilaterally. Abdomen:Soft, nontender. VQQ:VZDGLOVF for extremity edema. Skin:Warm and dry. Neuro:Awake, alert, and oriented. No focal deficit noted. Psych:Pleasant mood and affect.  Labs at discharge   Lab Results  Component Value Date   CREATININE 3.06 (H) 06/26/2022   BUN 14 06/26/2022   NA 137 06/26/2022   K 3.2 (L) 06/26/2022   CL 102 06/26/2022   CO2 25 06/26/2022   Lab Results  Component Value Date   WBC 9.0 06/26/2022   HGB 11.1 (L) 06/26/2022   HCT 34.1 (L) 06/26/2022   MCV 93.7 06/26/2022   PLT 177 06/26/2022   Lab Results  Component Value Date   ALT 17 06/24/2022   AST 13 (L) 06/24/2022   ALKPHOS  127 (H) 06/24/2022   BILITOT 0.6 06/24/2022   Lab Results  Component Value Date   INR 1.2 03/03/2021   INR 1.28 08/12/2015   INR 1.10 03/26/2013    Current radiological studies    No results found.  Disposition:    Discharge disposition: 01-Home or Self Care       Discharge Instructions     Call MD for:   persistant dizziness or light-headedness   Complete by: As directed    Call MD for:  persistant nausea and vomiting   Complete by: As directed    Call MD for:  temperature >100.4   Complete by: As directed    Diet - low sodium heart healthy   Complete by: As directed    Increase activity slowly   Complete by: As directed        Allergies as of 06/26/2022       Reactions   Amoxicillin Anaphylaxis, Other (See Comments)   Throat closes and gets sweaty.   Penicillins Anaphylaxis   Throat closes and gets sweaty   Iohexol Other (See Comments)    Desc: scratchy/itchy throat/itching on back / nausea   Ceftazidime Nausea And Vomiting        Medication List     STOP taking these medications    diphenhydrAMINE 25 MG tablet Commonly known as: BENADRYL   predniSONE 20 MG tablet Commonly known as: DELTASONE       TAKE these medications    acetaminophen 500 MG tablet Commonly known as: TYLENOL Take 500 mg by mouth every 6 (six) hours as needed for moderate pain or headache.   Alcohol Pads 70 % Pads SMARTSIG:2 Topical Twice Daily   ALEVEER EX Apply 1 Application topically 2 (two) times daily as needed (pain).   Easy Comfort Insulin Syringe 31G X 5/16" 1 ML Misc Generic drug: Insulin Syringe-Needle U-100   Easy Comfort Lancets Misc   ferric citrate 1 GM 210 MG(Fe) tablet Commonly known as: AURYXIA Take 630 mg by mouth 3 (three) times daily with meals.   ferrous sulfate 325 (65 FE) MG tablet Take 325 mg by mouth daily with breakfast.   Fluticasone-Salmeterol 100-50 MCG/DOSE Aepb Commonly known as: ADVAIR Inhale 1 puff into the lungs 2 (two) times daily as needed (shortness of breath).   insulin aspart 100 UNIT/ML injection Commonly known as: novoLOG Inject 15 Units into the skin 3 (three) times daily before meals.   ipratropium 17 MCG/ACT inhaler Commonly known as: ATROVENT HFA Inhale 2 puffs into the lungs daily as needed for wheezing (shortness of breath).    midodrine 10 MG tablet Commonly known as: PROAMATINE Take 2 tablets (20 mg total) by mouth 3 (three) times daily with meals. What changed: how much to take   multivitamin with minerals Tabs tablet Take 1 tablet by mouth daily.   OneTouch Verio test strip Generic drug: glucose blood 1 each 4 (four) times daily.   traMADol 50 MG tablet Commonly known as: ULTRAM Take 50 mg by mouth daily as needed (hand pain).         Follow-up appointment   Follow-up with PCP in next 1 week.  Discharge Condition:    good   Signed: Farrel Gordon 06/26/2022, 3:14 PM

## 2022-06-26 NOTE — Procedures (Signed)
I was present at this dialysis session. I have reviewed the session itself and made appropriate changes. Off pressors. BP acceptable. Feels well. Likely okay to DC later today from renal standpoint.  Filed Weights   06/25/22 0909 06/25/22 1238 06/26/22 0838  Weight: 80.9 kg 80.9 kg 107 kg    Recent Labs  Lab 06/25/22 1554  NA 136  K 5.5*  CL 95*  CO2 29  GLUCOSE 229*  BUN 24*  CREATININE 4.73*  CALCIUM 7.2*  PHOS 3.5    Recent Labs  Lab 06/23/22 1751 06/23/22 1843 06/23/22 2100 06/24/22 0500 06/25/22 0615  WBC 11.0*  --  16.3* 14.6* 8.3  NEUTROABS 7.5  --   --   --   --   HGB 12.9   < > 12.5 12.5 11.7*  HCT 40.0   < > 38.1 37.4 37.1  MCV 93.9  --  93.8 90.8 95.4  PLT 163  --  196 184 170   < > = values in this interval not displayed.    Scheduled Meds:  fludrocortisone  0.1 mg Oral Daily   heparin  5,000 Units Subcutaneous Q8H   insulin aspart  0-6 Units Subcutaneous Q4H   midodrine  20 mg Oral TID WC   mometasone-formoterol  2 puff Inhalation BID   pantoprazole  40 mg Oral Daily   Continuous Infusions:  sodium chloride Stopped (06/25/22 0844)   norepinephrine (LEVOPHED) Adult infusion Stopped (06/25/22 1041)   PRN Meds:.acetaminophen, docusate sodium, ipratropium-albuterol, mouth rinse, polyethylene glycol   Holly Bumpers,  MD 06/26/2022, 10:51 AM

## 2022-06-27 LAB — HEPATITIS B SURFACE ANTIBODY, QUANTITATIVE: Hep B S AB Quant (Post): 33.6 m[IU]/mL (ref >9.9)

## 2022-06-27 NOTE — TOC Transition Note (Addendum)
Transition of care contact from inpatient facility  Date of discharge: 06/26/22 Date of contact: 06/27/22 Method: Phone Call Spoke to: Patient  Contacted patient to discuss transition of care from recent inpatient hospitalization. Patient admitted 06/23/22-06/26/22 for acute on chronic hypotension. Midodrine has been raised to 20mg  TID.  Discharge medications have been reviewed.   Patient will follow up with her outpatient HD unit on: Thursday June 28, 2022 at [facility].  Tobie Poet, NP

## 2022-06-28 LAB — CULTURE, BLOOD (ROUTINE X 2)
Culture: NO GROWTH
Culture: NO GROWTH
Special Requests: ADEQUATE
Special Requests: ADEQUATE

## 2022-07-16 IMAGING — DX DG CHEST 1V PORT
1 series · 1 of 1 positions shown · non-contrast
Comparison: 04/10/2016

CLINICAL DATA: Hypoxia

EXAM:
PORTABLE CHEST 1 VIEW

[chest]
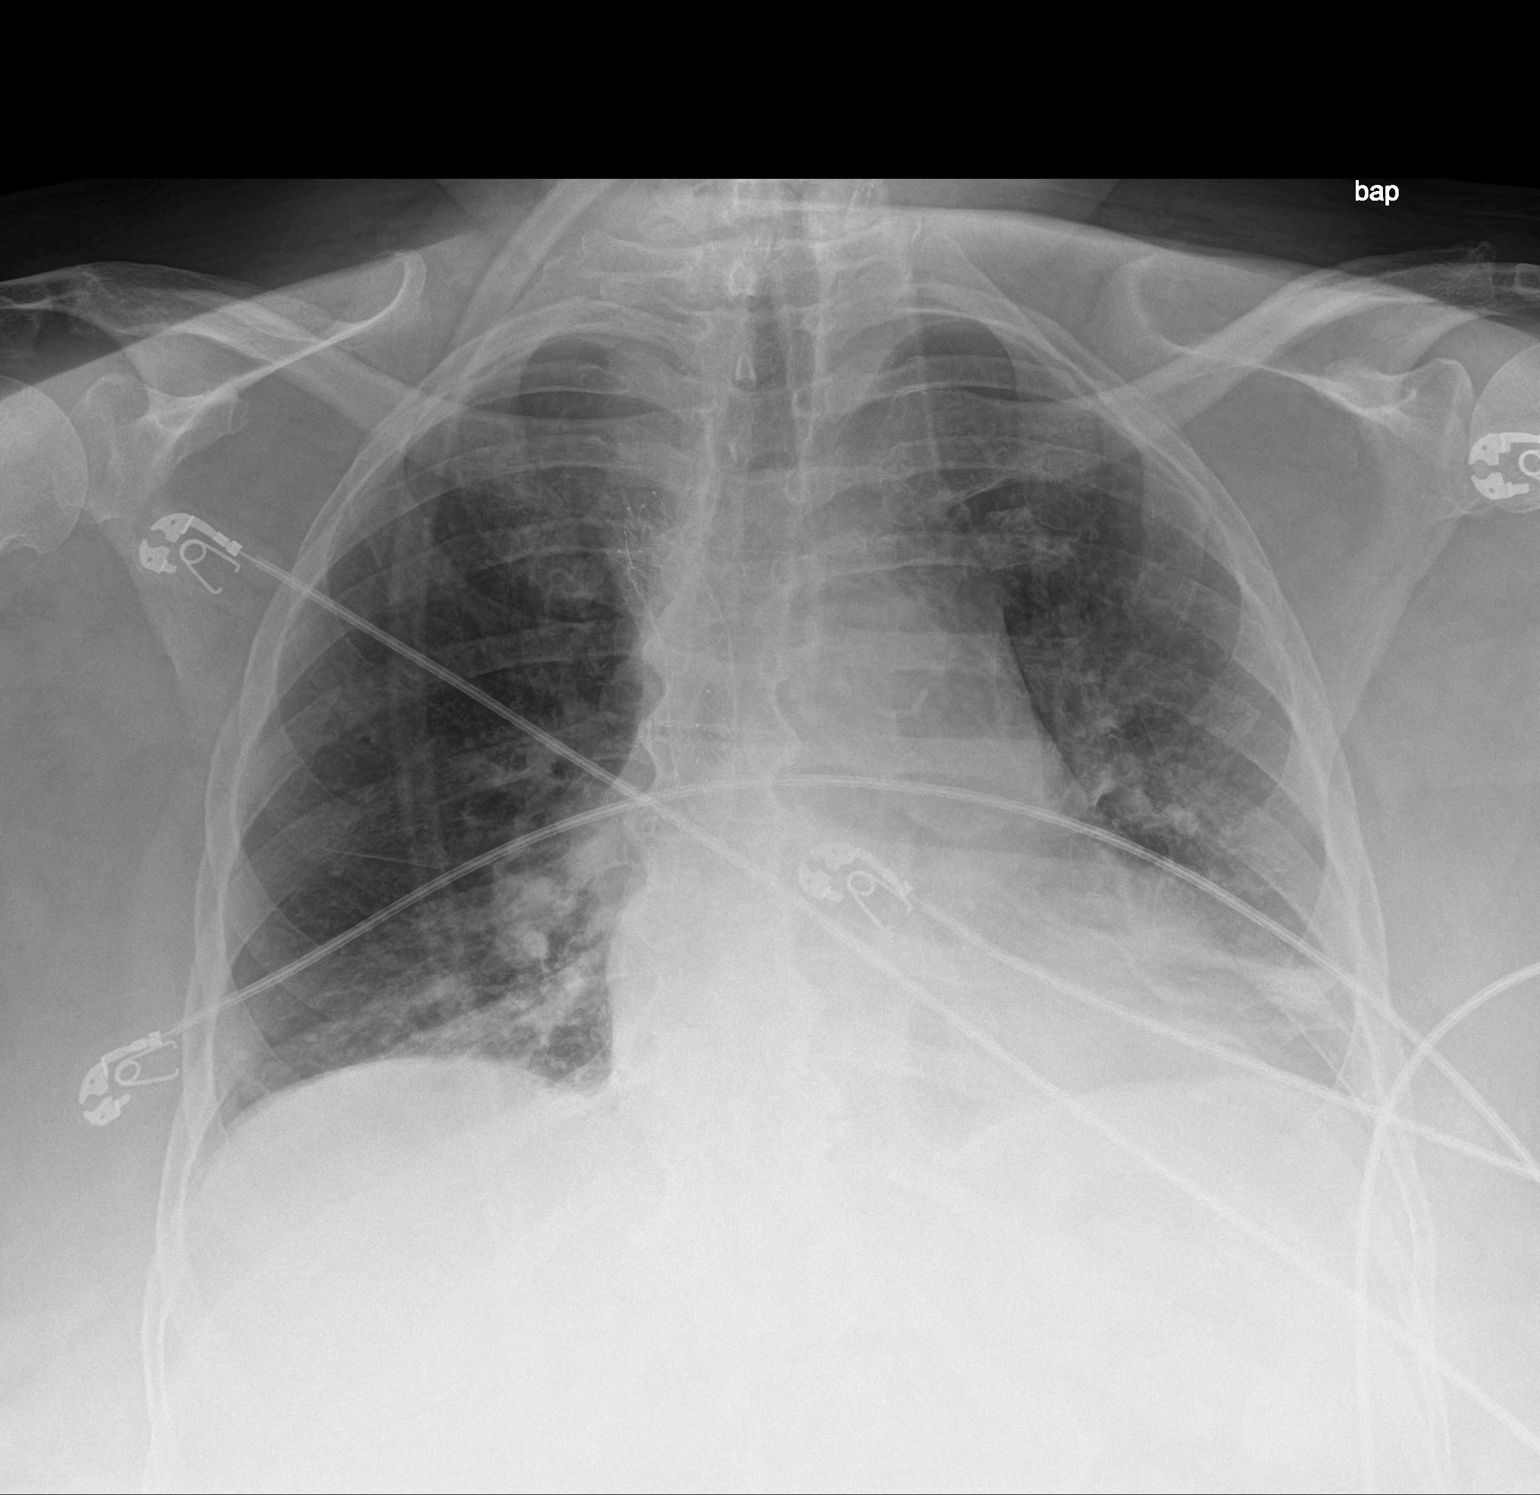

[1 of 1 positions shown; findings below may reference images not displayed]

FINDINGS: Cardiac shadow is mildly enlarged. Bibasilar atelectasis left
greater than right is noted. Lungs are otherwise clear. No bony
abnormality is seen. SVC stent is again noted.
IMPRESSION: Mild bibasilar atelectasis.

## 2022-07-25 ENCOUNTER — Encounter (HOSPITAL_COMMUNITY): Payer: Self-pay | Admitting: Orthopedic Surgery

## 2022-07-25 ENCOUNTER — Other Ambulatory Visit: Payer: Self-pay

## 2022-07-25 MED ORDER — VANCOMYCIN HCL 1500 MG/300ML IV SOLN
1500.0000 mg | INTRAVENOUS | Status: AC
  Administered 2022-07-26: 1500 mg via INTRAVENOUS
  Filled 2022-07-25: qty 300

## 2022-07-25 NOTE — Anesthesia Preprocedure Evaluation (Signed)
Anesthesia Evaluation  Patient identified by MRN, date of birth, ID band Patient awake    Reviewed: Allergy & Precautions, NPO status , Patient's Chart, lab work & pertinent test results  History of Anesthesia Complications Negative for: history of anesthetic complications  Airway Mallampati: IV  TM Distance: >3 FB Neck ROM: Full    Dental  (+) Teeth Intact, Dental Advisory Given   Pulmonary sleep apnea and Continuous Positive Airway Pressure Ventilation , COPD, PE   Pulmonary exam normal        Cardiovascular negative cardio ROS Normal cardiovascular exam  Echo 06/24/22: EF 70-75%, no RWMA, g1dd, normal RVSF, trivial MR, AV sclerosis w/o stenosis   Neuro/Psych negative neurological ROS     GI/Hepatic Neg liver ROS, GERD  ,  Endo/Other  diabetes, Type 2, Insulin DependentMorbid obesity  Renal/GU ESRF and DialysisRenal disease  negative genitourinary   Musculoskeletal negative musculoskeletal ROS (+)   Abdominal   Peds  Hematology  (+) Blood dyscrasia, anemia ,   Anesthesia Other Findings   Reproductive/Obstetrics                          Anesthesia Physical Anesthesia Plan  ASA: 4  Anesthesia Plan: General   Post-op Pain Management: Tylenol PO (pre-op)*   Induction: Intravenous  PONV Risk Score and Plan: 3 and Ondansetron, Dexamethasone, Midazolam and Treatment may vary due to age or medical condition  Airway Management Planned: LMA  Additional Equipment: None  Intra-op Plan:   Post-operative Plan: Extubation in OR  Informed Consent: I have reviewed the patients History and Physical, chart, labs and discussed the procedure including the risks, benefits and alternatives for the proposed anesthesia with the patient or authorized representative who has indicated his/her understanding and acceptance.     Dental advisory given  Plan Discussed with:   Anesthesia Plan Comments:        Anesthesia Quick Evaluation

## 2022-07-25 NOTE — Progress Notes (Signed)
PCP - Dr Jossie Ng   EKG - 07/02/22 ECHO - 06/24/22   CPAP - Wears most nights  Fasting Blood Sugar - 115 avg Checks Blood Sugar 3/day  ERAS Protcol - Clears until 0530  Anesthesia review: N  Patient verbally denies any shortness of breath, fever, cough and chest pain during phone call   -------------  SDW INSTRUCTIONS given:  Your procedure is scheduled on 07/26/22.  Report to [facility] Main Entrance "A" at 0600 A.M., and check in at the Admitting office.  Call this number if you have problems the morning of surgery:  [phone]   Remember:  Do not eat after midnight the night before your surgery  You may drink clear liquids until 0530 the morning of your surgery.   Clear liquids allowed are: Water, Non-Citrus Juices (without pulp), Carbonated Beverages, Clear Tea, Black Coffee Only, and Gatorade    Take these medicines the morning of surgery with A SIP OF WATER  midodrine (PROAMATINE) acetaminophen (TYLENOL)-if needed ipratropium (ATROVENT HFA)-if needed Fluticasone-Salmeterol (ADVAIR)-if needed  .** PLEASE check your blood sugar the morning of your surgery when you wake up and every 2 hours until you get to the Short Stay unit.  If your blood sugar is less than 70 mg/dL, you will need to treat for low blood sugar: Do not take insulin. Treat a low blood sugar (less than 70 mg/dL) with  cup of clear juice (cranberry or apple), 4 glucose tablets, OR glucose gel. Recheck blood sugar in 15 minutes after treatment (to make sure it is greater than 70 mg/dL). If your blood sugar is not greater than 70 mg/dL on recheck, call [phone] for further instructions.   If blood sugar is above 220 please take half a dose of the usual correction dose (5 units)   As of today, STOP taking any Aspirin (unless otherwise instructed by your surgeon) Aleve, Naproxen, Ibuprofen, Motrin, Advil, Goody's, BC's, all herbal medications, fish oil, and all vitamins.                       Do not wear jewelry, make up, or nail polish            Do not wear lotions, powders, perfumes/colognes, or deodorant.            Do not shave 48 hours prior to surgery.  Men may shave face and neck.            Do not bring valuables to the hospital.            [facility] is not responsible for any belongings or valuables.  Do NOT Smoke (Tobacco/Vaping) 24 hours prior to your procedure If you use a CPAP at night, you may bring all equipment for your overnight stay.   Contacts, glasses, dentures or bridgework may not be worn into surgery.      For patients admitted to the hospital, discharge time will be determined by your treatment team.   Patients discharged the day of surgery will not be allowed to drive home, and someone needs to stay with them for 24 hours.    Special instructions:   [place]- Preparing For Surgery  Before surgery, you can play an important role. Because skin is not sterile, your skin needs to be as free of germs as possible. You can reduce the number of germs on your skin by washing with CHG (chlorahexidine gluconate) Soap before surgery.  CHG is an antiseptic cleaner  which kills germs and bonds with the skin to continue killing germs even after washing.    Oral Hygiene is also important to reduce your risk of infection.  Remember - BRUSH YOUR TEETH THE MORNING OF SURGERY WITH YOUR REGULAR TOOTHPASTE  Please do not use if you have an allergy to CHG or antibacterial soaps. If your skin becomes reddened/irritated stop using the CHG.  Do not shave (including legs and underarms) for at least 48 hours prior to first CHG shower. It is OK to shave your face.  Please follow these instructions carefully.   Shower the NIGHT BEFORE SURGERY and the MORNING OF SURGERY with DIAL Soap.   Pat yourself dry with a CLEAN TOWEL.  Wear CLEAN PAJAMAS to bed the night before surgery  Place CLEAN SHEETS on your bed the night of your first shower and DO NOT SLEEP WITH  PETS.   Day of Surgery: Please shower morning of surgery  Wear Clean/Comfortable clothing the morning of surgery Do not apply any deodorants/lotions.   Remember to brush your teeth WITH YOUR REGULAR TOOTHPASTE.   Questions were answered. Patient verbalized understanding of instructions.

## 2022-07-26 ENCOUNTER — Encounter (HOSPITAL_COMMUNITY)
Admission: RE | Disposition: A | Discharge status: Home / Self Care | Payer: Self-pay | Source: Home / Self Care | Attending: Orthopedic Surgery

## 2022-07-26 ENCOUNTER — Other Ambulatory Visit: Payer: Self-pay

## 2022-07-26 ENCOUNTER — Ambulatory Visit (HOSPITAL_COMMUNITY)
Admission: RE | Admit: 2022-07-26 | Discharge: 2022-07-26 | Disposition: A | Discharge status: Home / Self Care | Payer: Medicare Other | Attending: Orthopedic Surgery | Admitting: Orthopedic Surgery

## 2022-07-26 ENCOUNTER — Ambulatory Visit (HOSPITAL_COMMUNITY): Payer: Medicare Other | Admitting: Anesthesiology

## 2022-07-26 ENCOUNTER — Ambulatory Visit (HOSPITAL_BASED_OUTPATIENT_CLINIC_OR_DEPARTMENT_OTHER): Payer: Medicare Other | Admitting: Anesthesiology

## 2022-07-26 ENCOUNTER — Encounter (HOSPITAL_COMMUNITY): Payer: Self-pay | Admitting: Orthopedic Surgery

## 2022-07-26 DIAGNOSIS — Z9989 Dependence on other enabling machines and devices: Secondary | ICD-10-CM

## 2022-07-26 DIAGNOSIS — G4730 Sleep apnea, unspecified: Secondary | ICD-10-CM | POA: Insufficient documentation

## 2022-07-26 DIAGNOSIS — G5602 Carpal tunnel syndrome, left upper limb: Secondary | ICD-10-CM

## 2022-07-26 DIAGNOSIS — N186 End stage renal disease: Secondary | ICD-10-CM | POA: Diagnosis not present

## 2022-07-26 DIAGNOSIS — G4733 Obstructive sleep apnea (adult) (pediatric): Secondary | ICD-10-CM

## 2022-07-26 DIAGNOSIS — E1122 Type 2 diabetes mellitus with diabetic chronic kidney disease: Secondary | ICD-10-CM | POA: Diagnosis not present

## 2022-07-26 DIAGNOSIS — Z794 Long term (current) use of insulin: Secondary | ICD-10-CM | POA: Diagnosis not present

## 2022-07-26 DIAGNOSIS — J449 Chronic obstructive pulmonary disease, unspecified: Secondary | ICD-10-CM | POA: Insufficient documentation

## 2022-07-26 DIAGNOSIS — Z992 Dependence on renal dialysis: Secondary | ICD-10-CM | POA: Diagnosis not present

## 2022-07-26 HISTORY — PX: CARPAL TUNNEL RELEASE: SHX101

## 2022-07-26 LAB — POCT I-STAT, CHEM 8
BUN: 4 mg/dL — ABNORMAL LOW (ref 8–23)
Calcium, Ion: 1.09 mmol/L — ABNORMAL LOW (ref 1.15–1.40)
Chloride: 95 mmol/L — ABNORMAL LOW (ref 98–111)
Creatinine, Ser: 4.6 mg/dL — ABNORMAL HIGH (ref 0.44–1.00)
Glucose, Bld: 101 mg/dL — ABNORMAL HIGH (ref 70–99)
HCT: 40 % (ref 36.0–46.0)
Hemoglobin: 13.6 g/dL (ref 12.0–15.0)
Potassium: 3.2 mmol/L — ABNORMAL LOW (ref 3.5–5.1)
Sodium: 136 mmol/L (ref 135–145)
TCO2: 28 mmol/L (ref 22–32)

## 2022-07-26 LAB — GLUCOSE, CAPILLARY
Glucose-Capillary: 89 mg/dL (ref 70–99)
Glucose-Capillary: 102 mg/dL — ABNORMAL HIGH (ref 70–99)

## 2022-07-26 SURGERY — CARPAL TUNNEL RELEASE
Anesthesia: General | Site: Hand | Laterality: Left

## 2022-07-26 MED ORDER — 0.9 % SODIUM CHLORIDE (POUR BTL) OPTIME
TOPICAL | Status: DC | PRN
  Administered 2022-07-26: 1000 mL

## 2022-07-26 MED ORDER — FENTANYL CITRATE (PF) 250 MCG/5ML IJ SOLN
INTRAMUSCULAR | Status: AC
  Filled 2022-07-26: qty 5

## 2022-07-26 MED ORDER — ORAL CARE MOUTH RINSE: 15.0000 mL | Freq: Once | OROMUCOSAL | Status: AC

## 2022-07-26 MED ORDER — HYDROCODONE-ACETAMINOPHEN 5-325 MG PO TABS: ORAL_TABLET | ORAL | 0 refills | Status: AC

## 2022-07-26 MED ORDER — MIDAZOLAM HCL 2 MG/2ML IJ SOLN
INTRAMUSCULAR | Status: AC
  Filled 2022-07-26: qty 2

## 2022-07-26 MED ORDER — PROPOFOL 500 MG/50ML IV EMUL
INTRAVENOUS | Status: DC | PRN
  Administered 2022-07-26: 20 ug/kg/min via INTRAVENOUS

## 2022-07-26 MED ORDER — ONDANSETRON HCL 4 MG/2ML IJ SOLN
INTRAMUSCULAR | Status: DC | PRN
  Administered 2022-07-26: 4 mg via INTRAVENOUS

## 2022-07-26 MED ORDER — BUPIVACAINE HCL (PF) 0.25 % IJ SOLN
INTRAMUSCULAR | Status: AC
  Filled 2022-07-26: qty 30

## 2022-07-26 MED ORDER — LIDOCAINE-EPINEPHRINE (PF) 1.5 %-1:200000 IJ SOLN
INTRAMUSCULAR | Status: DC | PRN
  Administered 2022-07-26: 30 mL via PERINEURAL

## 2022-07-26 MED ORDER — SODIUM CHLORIDE 0.9 % IV SOLN: INTRAVENOUS | Status: DC

## 2022-07-26 MED ORDER — ACETAMINOPHEN 500 MG PO TABS
1000.0000 mg | ORAL_TABLET | Freq: Once | ORAL | Status: AC
  Administered 2022-07-26: 1000 mg via ORAL
  Filled 2022-07-26: qty 2

## 2022-07-26 MED ORDER — CHLORHEXIDINE GLUCONATE 0.12 % MT SOLN
15.0000 mL | Freq: Once | OROMUCOSAL | Status: AC
  Administered 2022-07-26: 15 mL via OROMUCOSAL
  Filled 2022-07-26: qty 15

## 2022-07-26 MED ORDER — BUPIVACAINE HCL 0.25 % IJ SOLN
INTRAMUSCULAR | Status: DC | PRN
  Administered 2022-07-26: 10 mL

## 2022-07-26 MED ORDER — INSULIN ASPART 100 UNIT/ML IJ SOLN: 0.0000 [IU] | INTRAMUSCULAR | Status: DC | PRN

## 2022-07-26 MED ORDER — PROPOFOL 10 MG/ML IV BOLUS
INTRAVENOUS | Status: AC
  Filled 2022-07-26: qty 20

## 2022-07-26 MED ORDER — MIDAZOLAM HCL 2 MG/2ML IJ SOLN
INTRAMUSCULAR | Status: DC | PRN
  Administered 2022-07-26 (×2): 1 mg via INTRAVENOUS

## 2022-07-26 SURGICAL SUPPLY — 49 items
APL PRP STRL LF DISP 70% ISPRP (MISCELLANEOUS) ×2
BAG COUNTER SPONGE SURGICOUNT (BAG) ×3 IMPLANT
BAG SPNG CNTER NS LX DISP (BAG) ×2
BANDAGE ACE 3X5.8 VEL STRL LF (GAUZE/BANDAGES/DRESSINGS) ×1 IMPLANT
BNDG CMPR 9X4 STRL LF SNTH (GAUZE/BANDAGES/DRESSINGS) ×2
BNDG ELASTIC 3X5.8 VLCR STR LF (GAUZE/BANDAGES/DRESSINGS) ×3 IMPLANT
BNDG ELASTIC 4X5.8 VLCR STR LF (GAUZE/BANDAGES/DRESSINGS) ×3 IMPLANT
BNDG ESMARK 4X9 LF (GAUZE/BANDAGES/DRESSINGS) ×3 IMPLANT
BNDG GAUZE DERMACEA FLUFF (GAUZE/BANDAGES/DRESSINGS) ×2
BNDG GAUZE DERMACEA FLUFF 4 (GAUZE/BANDAGES/DRESSINGS) ×1 IMPLANT
BNDG GAUZE ELAST 4 BULKY (GAUZE/BANDAGES/DRESSINGS) ×3 IMPLANT
BNDG GZE DERMACEA 4 6PLY (GAUZE/BANDAGES/DRESSINGS) ×2
CHLORAPREP W/TINT 26 (MISCELLANEOUS) ×3 IMPLANT
CORD BIPOLAR FORCEPS 12FT (ELECTRODE) ×3 IMPLANT
COVER SURGICAL LIGHT HANDLE (MISCELLANEOUS) ×3 IMPLANT
CUFF TOURN SGL QUICK 18X4 (TOURNIQUET CUFF) ×3 IMPLANT
DRAPE SURG 17X23 STRL (DRAPES) ×3 IMPLANT
DRSG PAD ABDOMINAL 8X10 ST (GAUZE/BANDAGES/DRESSINGS) ×5 IMPLANT
GAUZE SPONGE 4X4 12PLY STRL (GAUZE/BANDAGES/DRESSINGS) ×3 IMPLANT
GAUZE XEROFORM 1X8 LF (GAUZE/BANDAGES/DRESSINGS) ×3 IMPLANT
GLOVE BIO SURGEON STRL SZ7.5 (GLOVE) ×6 IMPLANT
GLOVE BIO SURGEON STRL SZ8 (GLOVE) ×1 IMPLANT
GLOVE BIOGEL PI IND STRL 8 (GLOVE) ×3 IMPLANT
GLOVE BIOGEL PI IND STRL 8.5 (GLOVE) ×1 IMPLANT
GLOVE BIOGEL PI INDICATOR 8 (GLOVE) ×2
GLOVE BIOGEL PI INDICATOR 8.5 (GLOVE) ×2
GOWN STRL REUS W/ TWL LRG LVL3 (GOWN DISPOSABLE) ×6 IMPLANT
GOWN STRL REUS W/TWL LRG LVL3 (GOWN DISPOSABLE) ×4
KIT BASIN OR (CUSTOM PROCEDURE TRAY) ×3 IMPLANT
KIT TURNOVER KIT B (KITS) ×3 IMPLANT
NDL HYPO 25GX1X1/2 BEV (NEEDLE) IMPLANT
NEEDLE HYPO 25GX1X1/2 BEV (NEEDLE) ×2 IMPLANT
NS IRRIG 1000ML POUR BTL (IV SOLUTION) ×3 IMPLANT
PACK ORTHO EXTREMITY (CUSTOM PROCEDURE TRAY) ×3 IMPLANT
PAD ARMBOARD 7.5X6 YLW CONV (MISCELLANEOUS) ×6 IMPLANT
PADDING CAST ABS 4INX4YD NS (CAST SUPPLIES) ×2
PADDING CAST ABS COTTON 4X4 ST (CAST SUPPLIES) ×3 IMPLANT
SPONGE T-LAP 4X18 ~~LOC~~+RFID (SPONGE) ×3 IMPLANT
SUCTION FRAZIER HANDLE 10FR (MISCELLANEOUS) ×2
SUCTION TUBE FRAZIER 10FR DISP (MISCELLANEOUS) ×1 IMPLANT
SUT ETHILON 3 0 PS 1 (SUTURE) ×3 IMPLANT
SUT ETHILON 4 0 PS 2 18 (SUTURE) ×2 IMPLANT
SUT VIC AB 3-0 SH 18 (SUTURE) ×3 IMPLANT
SYR CONTROL 10ML LL (SYRINGE) ×1 IMPLANT
TOWEL GREEN STERILE (TOWEL DISPOSABLE) ×3 IMPLANT
TOWEL GREEN STERILE FF (TOWEL DISPOSABLE) ×3 IMPLANT
TUBE CONNECTING 12X1/4 (SUCTIONS) ×1 IMPLANT
UNDERPAD 30X36 HEAVY ABSORB (UNDERPADS AND DIAPERS) ×3 IMPLANT
WATER STERILE IRR 1000ML POUR (IV SOLUTION) ×3 IMPLANT

## 2022-07-26 NOTE — Progress Notes (Signed)
Orthopedic Tech Progress Note Patient Details:  [name] 13-May-1961 [id number]  Ortho Devices Type of Ortho Device: Arm sling Ortho Device/Splint Interventions: Ordered      [name] 07/26/2022, 10:14 AM

## 2022-07-26 NOTE — Anesthesia Procedure Notes (Signed)
Procedure Name: MAC Date/Time: 07/26/2022 8:38 AM  Performed by: Dorann Lodge, CRNAPre-anesthesia Checklist: Patient identified, Emergency Drugs available, Suction available and Patient being monitored Patient Re-evaluated:Patient Re-evaluated prior to induction Oxygen Delivery Method: Simple face mask Dental Injury: Teeth and Oropharynx as per pre-operative assessment

## 2022-07-26 NOTE — Discharge Instructions (Signed)

## 2022-07-26 NOTE — H&P (Signed)
[name] is an 61 y.o. female.   Chief Complaint: carpal tunnel syndrome HPI: 61 yo female with numbness and tingling in hands.  Nocturnal symptoms.  Positive nerve conduction studies.  She wishes to have repeat carpal tunnel release on left side with possible hypothenar fat pad transfer.  Allergies:  Allergies  Allergen Reactions   Amoxicillin Anaphylaxis and Other (See Comments)    Throat closes and gets sweaty.   Penicillins Anaphylaxis    Throat closes and gets sweaty   Iohexol Other (See Comments)     Desc: scratchy/itchy throat/itching on back / nausea    Ceftazidime Nausea And Vomiting    Past Medical History:  Diagnosis Date   Arthritis    Chronic hypotension    Complication of anesthesia 2016   confusion   COPD (chronic obstructive pulmonary disease) (HCC)    Diabetes mellitus type 2 in obese (HCC)    insulin dependent   ESRD (end stage renal disease) on dialysis ([place])    "TTS; [facility]; (03/25/2014)   GERD (gastroesophageal reflux disease)    Iron deficiency anemia    Obesities, morbid (HCC)    OSA on CPAP    PE (pulmonary embolism) ~ 2010   Pneumonia    "now & once a long time ago" (03/26/2013)   Shortness of breath    "just a little bit; at any time" (03/26/2013)   Superior vena cava syndrome    Archie Endo 03/26/2013    Past Surgical History:  Procedure Laterality Date   ARTERIOVENOUS GRAFT PLACEMENT Left ~ 2011   "thigh" (4   CARPAL TUNNEL RELEASE Right ~ 2011   CARPAL TUNNEL RELEASE Right 01/28/2015   Procedure: RIGHT CARPAL TUNNEL RELEASE;  Surgeon: Roseanne Kaufman, MD;  Location: [facility];  Service: Orthopedics;  Laterality: Right;   PARATHYROIDECTOMY  02/1993   REVISION OF ARTERIOVENOUS GORETEX GRAFT Left 08/14/2015   Procedure: REVISION OF ARTERIOVENOUS GORETEX GRAFT LEFT THIGH with  REMOVAL of segment of infected graft.;  Surgeon: Angelia Mould, MD;  Location: [facility] OR;  Service: Vascular;  Laterality: Left;   REVISION OF ARTERIOVENOUS GORETEX  GRAFT Left 03/03/2021   Procedure: REVISION OF LEFT THIGH ARTERIOVENOUS GORETEX GRAFT;  Surgeon: Elam Dutch, MD;  Location: [facility] OR;  Service: Vascular;  Laterality: Left;   THROMBECTOMY AND REVISION OF ARTERIOVENTOUS (AV) GORETEX  GRAFT Left ~ 12/2012   "thigh"    VASCULAR SURGERY      Family History: Family History  Problem Relation Age of Onset   Diabetes Mellitus II Mother    Hypertension Mother    Hypertension Father    Diabetes Mellitus II Father     Social History:   reports that she has never smoked. She has never used smokeless tobacco. She reports that she does not drink alcohol and does not use drugs.  Medications: Medications Prior to Admission  Medication Sig Dispense Refill   ferric citrate (AURYXIA) 1 GM 210 MG(Fe) tablet Take 630 mg by mouth 3 (three) times daily with meals.     ferrous sulfate 325 (65 FE) MG tablet Take 325 mg by mouth daily with breakfast.     Fluticasone-Salmeterol (ADVAIR) 100-50 MCG/DOSE AEPB Inhale 1 puff into the lungs 2 (two) times daily as needed (shortness of breath).     HUMALOG KWIKPEN 100 UNIT/ML KwikPen Inject 10 Units into the skin 3 (three) times daily before meals.     midodrine (PROAMATINE) 10 MG tablet Take 2 tablets (20 mg total) by mouth 3 (  three) times daily with meals. 180 tablet 2   Multiple Vitamin (MULTIVITAMIN WITH MINERALS) TABS tablet Take 1 tablet by mouth daily.     acetaminophen (TYLENOL) 500 MG tablet Take 500 mg by mouth every 6 (six) hours as needed for moderate pain or headache.     Alcohol Swabs (ALCOHOL PADS) 70 % PADS SMARTSIG:2 Topical Twice Daily     Capsaicin-Menthol (ALEVEER EX) Apply 1 Application topically 2 (two) times daily as needed (pain).     EASY COMFORT INSULIN SYRINGE 31G X 5/16" 1 ML MISC      Easy Comfort Lancets MISC      ipratropium (ATROVENT HFA) 17 MCG/ACT inhaler Inhale 2 puffs into the lungs daily as needed for wheezing (shortness of breath).     ONETOUCH VERIO test strip 1 each 4 (four)  times daily.     traMADol (ULTRAM) 50 MG tablet Take 50 mg by mouth daily as needed (hand pain).      Results for orders placed or performed during the hospital encounter of 07/26/22 (from the past 48 hour(s))  Glucose, capillary     Status: None   Collection Time: 07/26/22  6:48 AM  Result Value Ref Range   Glucose-Capillary 89 70 - 99 mg/dL    Comment: Glucose reference range applies only to samples taken after fasting for at least 8 hours.  I-STAT, chem 8     Status: Abnormal   Collection Time: 07/26/22  7:32 AM  Result Value Ref Range   Sodium 136 135 - 145 mmol/L   Potassium 3.2 (L) 3.5 - 5.1 mmol/L   Chloride 95 (L) 98 - 111 mmol/L   BUN 4 (L) 8 - 23 mg/dL   Creatinine, Ser 4.60 (H) 0.44 - 1.00 mg/dL   Glucose, Bld 101 (H) 70 - 99 mg/dL    Comment: Glucose reference range applies only to samples taken after fasting for at least 8 hours.   Calcium, Ion 1.09 (L) 1.15 - 1.40 mmol/L   TCO2 28 22 - 32 mmol/L   Hemoglobin 13.6 12.0 - 15.0 g/dL   HCT 40.0 36.0 - 46.0 %    No results found.    Blood pressure 126/80, pulse 81, temperature 98.4 F (36.9 C), temperature source Oral, resp. rate 18, height 5\' 1"  (1.549 m), weight 112 kg, SpO2 94 %.  General appearance: alert, cooperative, and appears stated age Head: Normocephalic, without obvious abnormality, atraumatic Neck: supple, symmetrical, trachea midline Extremities: Intact capillary refill all digits.  Tingling in fingers.  +epl/fpl/io.  No wounds.  Pulses: 2+ and symmetric Skin: Skin color, texture, turgor normal. No rashes or lesions Neurologic: Grossly normal Incision/Wound: none  Assessment/Plan Left recurrent carpal tunnel syndrome.  Non operative and operative treatment options have been discussed with the patient and patient wishes to proceed with operative treatment.Risks, benefits, and alternatives of surgery have been discussed and the patient agrees with the plan of care.   Leanora Cover 07/26/2022, 8:15  AM

## 2022-07-26 NOTE — Transfer of Care (Signed)
Immediate Anesthesia Transfer of Care Note  Patient: [name]  Procedure(s) Performed: LEFT CARPAL TUNNEL RELEASE (Left: Hand)  Patient Location: PACU  Anesthesia Type:MAC and Regional  Level of Consciousness: awake and drowsy  Airway & Oxygen Therapy: Patient Spontanous Breathing and Patient connected to face mask oxygen  Post-op Assessment: Report given to RN and Post -op Vital signs reviewed and stable  Post vital signs: Reviewed and stable  Last Vitals:  Vitals Value Taken Time  BP 127/80 07/26/22 0935  Temp    Pulse 75 07/26/22 0937  Resp 12 07/26/22 0937  SpO2 98 % 07/26/22 0937  Vitals shown include unvalidated device data.  Last Pain:  Vitals:   07/26/22 0700  TempSrc:   PainSc: 9       Patients Stated Pain Goal: 2 (04/75/33 9179)  Complications: No notable events documented.

## 2022-07-26 NOTE — Op Note (Signed)
I assisted Surgeon(s) and Role:    * Leanora Cover, MD - Primary    Daryll Brod, MD - Assisting on the Procedure(s): LEFT CARPAL [place] on 07/26/2022.  I provided assistance on this case as follows: setup approach, identification, release of median nerve, closure of the wound and application of the dressing .  Electronically signed by: Daryll Brod, MD Date: 07/26/2022 Time: 9:30 AM

## 2022-07-26 NOTE — Op Note (Signed)
NAME: [name] MEDICAL RECORD NO: [id number] DATE OF BIRTH: January 02, 1961 FACILITY: [facility] LOCATION: [facility] OR PHYSICIAN: Tennis Must, MD   OPERATIVE REPORT   DATE OF PROCEDURE: 07/26/22    PREOPERATIVE DIAGNOSIS: Left recurrent carpal tunnel syndrome   POSTOPERATIVE DIAGNOSIS: Left recurrent carpal tunnel syndrome   PROCEDURE: Release of left recurrent carpal tunnel syndrome   SURGEON:  Leanora Cover, M.D.   ASSISTANT: Daryll Brod, MD   ANESTHESIA:  Regional with sedation   INTRAVENOUS FLUIDS:  Per anesthesia flow sheet.   ESTIMATED BLOOD LOSS:  Minimal.   COMPLICATIONS:  None.   SPECIMENS:  none   TOURNIQUET TIME:   Left forearm Esmarch bandage: 28 minutes   DISPOSITION:  Stable to PACU.   INDICATIONS: 62 year old female with numbness and tingling left hand.  Positive nerve conduction studies.  She has nocturnal symptoms.  She wishes to proceed with repeat release of left carpal tunnel syndrome.  Risks, benefits and alternatives of surgery were discussed including the risks of blood loss, infection, damage to nerves, vessels, tendons, ligaments, bone for surgery, need for additional surgery, complications with wound healing, continued pain, stiffness, , recurrence.  She voiced understanding of these risks and elected to proceed.  OPERATIVE COURSE:  After being identified preoperatively by myself,  the patient and I agreed on the procedure and site of the procedure.  The surgical site was marked.  Surgical consent had been signed. Preoperative IV antibiotic prophylaxis was given. She was transferred to the operating room and placed on the operating table in supine position with the Left upper extremity on an arm board.  Sedation was induced by the anesthesiologist. A regional block had been performed by anesthesia.    Left upper extremity was prepped and draped in normal sterile orthopedic fashion.  A surgical pause was performed between the surgeons, anesthesia, and  operating room staff and all were in agreement as to the patient, procedure, and site of procedure.  An Esmarch bandage was used as a tourniquet just proximal to the wrist.  Incision was made over the transverse carpal ligament and distal forearm.  This was carried into subcutaneous tissues by spreading technique.  Bipolar electrocautery was used to obtain hemostasis.  The median nerve was identified underneath the volar brachial fascia.  The fascia was released.  The nerve was protected throughout the case.  The nerve was then decompressed from proximal to distal under direct visualization.  The knife was used to release the reformed transverse carpal ligament.  The entirety of the ligament was released.  The nerve was inspected.  There was hourglass deformity.  There was synovium around the flexor tendons.  There was some fluid as well.  Adhesions to the nerve were released.  The nerve was inspected again.  There was hourglass deformity.  The motor branch was identified and was intact.  The wound was copiously irrigated with sterile saline.  Was closed with 4-0 nylon in a horizontal mattress fashion.  It was injected with quarter percent plain Marcaine to aid in postoperative analgesia.  Was dressed with sterile Xeroform 4 x 4's and ABD and wrapped with Kerlix and Ace bandage.  The Esmarch bandage tourniquet was removed at 28 minutes.  Fingertips were pink with brisk capillary refill after deflation of tourniquet.  The operative  drapes were broken down.  The patient was awoken from anesthesia safely.  She was transferred back to the stretcher and taken to PACU in stable condition.  I will see her back  in the office in 1 week for postoperative followup.  I will give her a prescription for Norco 5/325 1-2 tabs PO q6 hours prn pain, dispense # 20.   Leanora Cover, MD Electronically signed, 07/26/22

## 2022-07-26 NOTE — Anesthesia Procedure Notes (Addendum)
  Anesthesia Regional Block: Supraclavicular block   Pre-Anesthetic Checklist: , timeout performed,  Correct Patient, Correct Site, Correct Laterality,  Correct Procedure, Correct Position, site marked,  Risks and benefits discussed,  Surgical consent,  Pre-op evaluation,  At surgeon's request and post-op pain management  Laterality: Left  Prep: chloraprep       Needles:  Injection technique: Single-shot  Needle Type: Echogenic Stimulator Needle     Needle Length: 10cm  Needle Gauge: 20     Additional Needles:   Procedures:,,,, ultrasound used (permanent image in chart),,    Narrative:  Start time: 07/26/2022 8:35 AM End time: 07/26/2022 8:40 AM Injection made incrementally with aspirations every 5 mL.  Performed by: Personally  Anesthesiologist: Lidia Collum, MD  Additional Notes: Standard monitors applied. Skin prepped. Good needle visualization with ultrasound. Injection made in 5cc increments with no resistance to injection. Patient tolerated the procedure well.

## 2022-07-27 ENCOUNTER — Encounter (HOSPITAL_COMMUNITY): Payer: Self-pay | Admitting: Orthopedic Surgery

## 2022-07-27 NOTE — Anesthesia Postprocedure Evaluation (Signed)
Anesthesia Post Note  Patient: [name]  Procedure(s) Performed: LEFT CARPAL TUNNEL RELEASE (Left: Hand)     Patient location during evaluation: PACU Anesthesia Type: Regional Level of consciousness: awake and alert Pain management: pain level controlled Vital Signs Assessment: post-procedure vital signs reviewed and stable Respiratory status: spontaneous breathing, nonlabored ventilation and respiratory function stable Cardiovascular status: blood pressure returned to baseline and stable Postop Assessment: no apparent nausea or vomiting Anesthetic complications: no   No notable events documented.  Last Vitals:  Vitals:   07/26/22 1030 07/26/22 1041  BP: 111/65 95/63  Pulse: 77 76  Resp: 12 12  Temp:  36.4 C  SpO2: 96% 93%    Last Pain:  Vitals:   07/26/22 1041  TempSrc:   PainSc: 0-No pain   Pain Goal: Patients Stated Pain Goal: 2 (07/26/22 0700)                 Lidia Collum

## 2022-08-10 DEATH — deceased

## 2023-02-22 IMAGING — DX DG KNEE COMPLETE 4+V*L*
1 series · 4 of 4 positions shown · non-contrast
Comparison: Left knee x-rays dated July 01, 2019.

CLINICAL DATA: Left knee pain for the past 2 days.  No injury.

EXAM:
LEFT KNEE - COMPLETE 4+ VIEW

[Series 1: knee · 0.14mm/px · 4 of 4 slices shown]
[im 1/4]
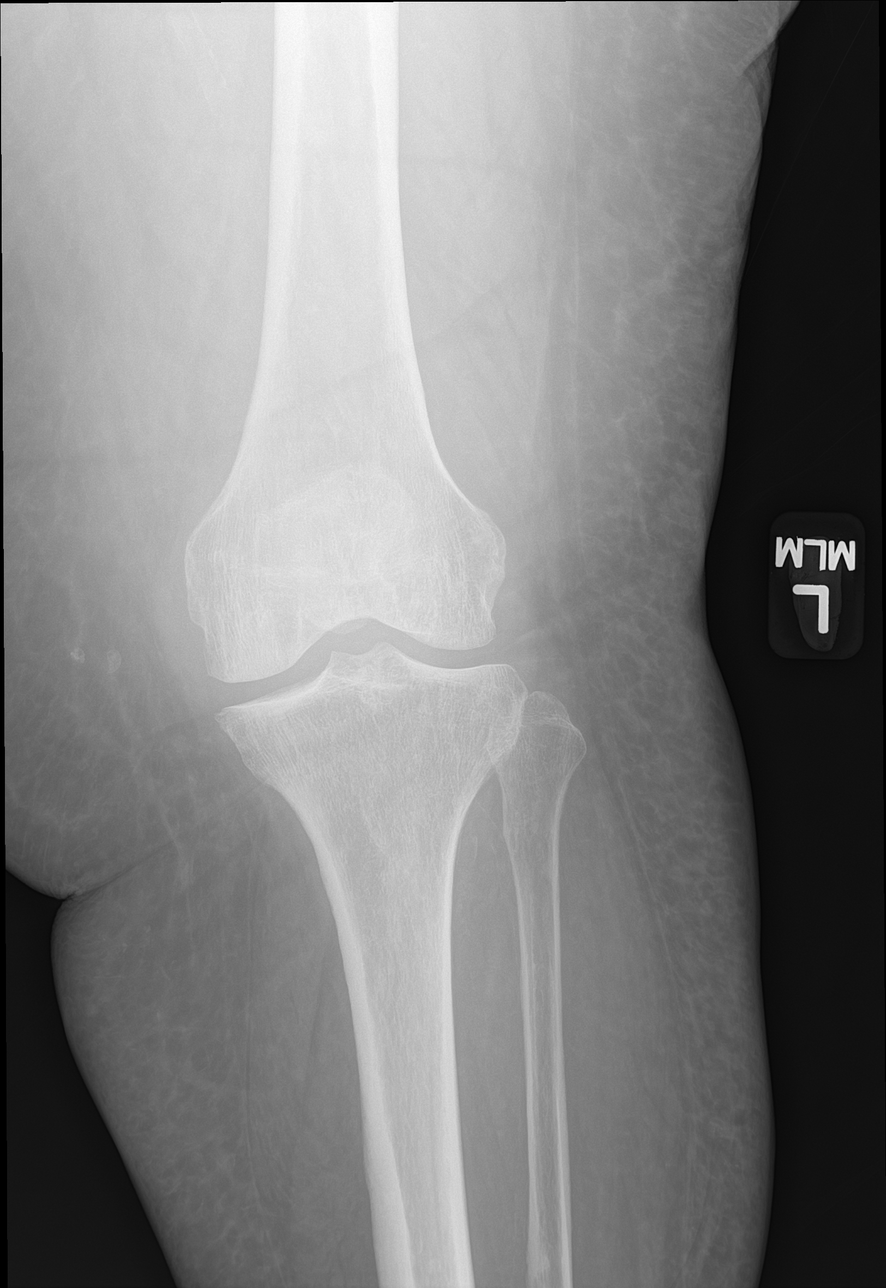
[im 2/4]
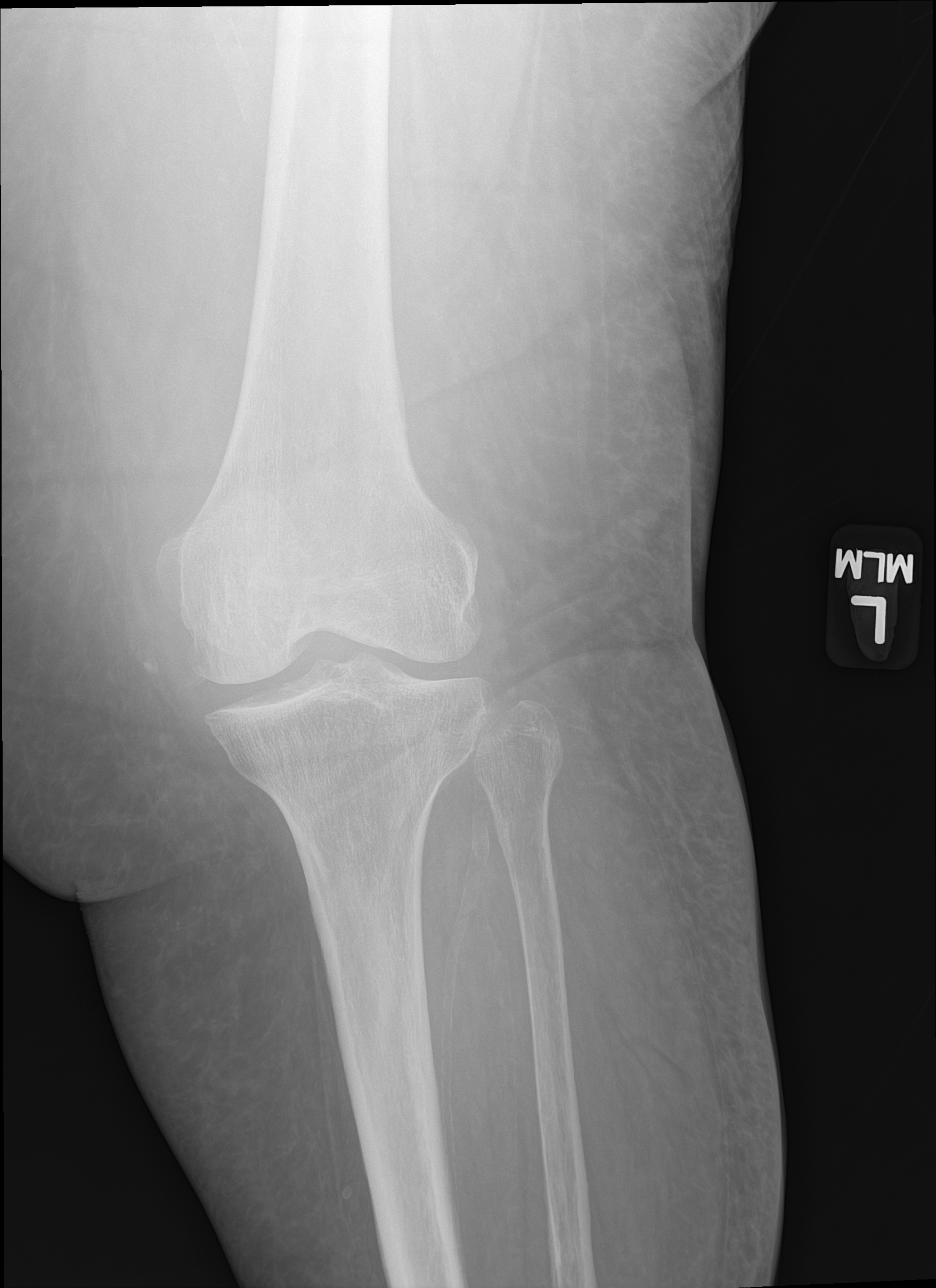
[im 3/4]
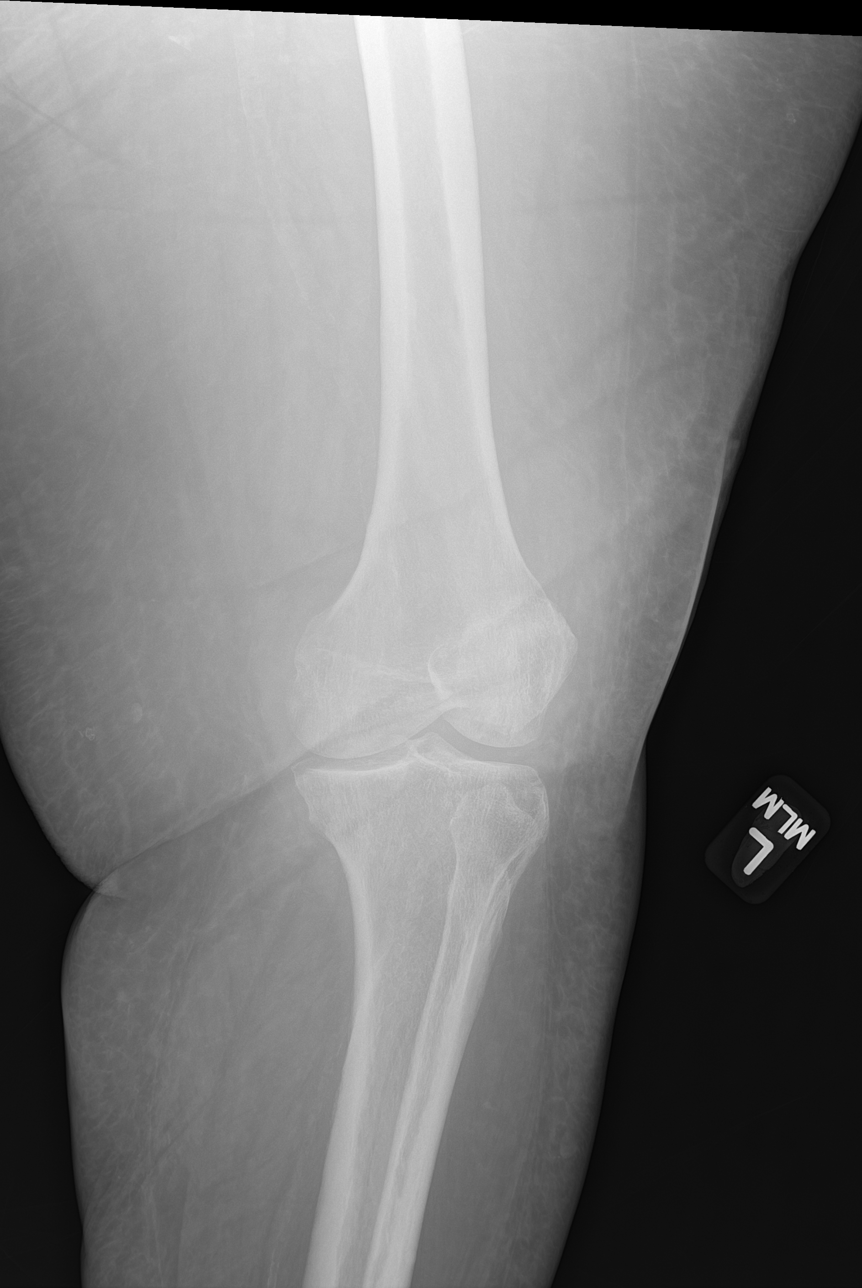
[im 4/4]
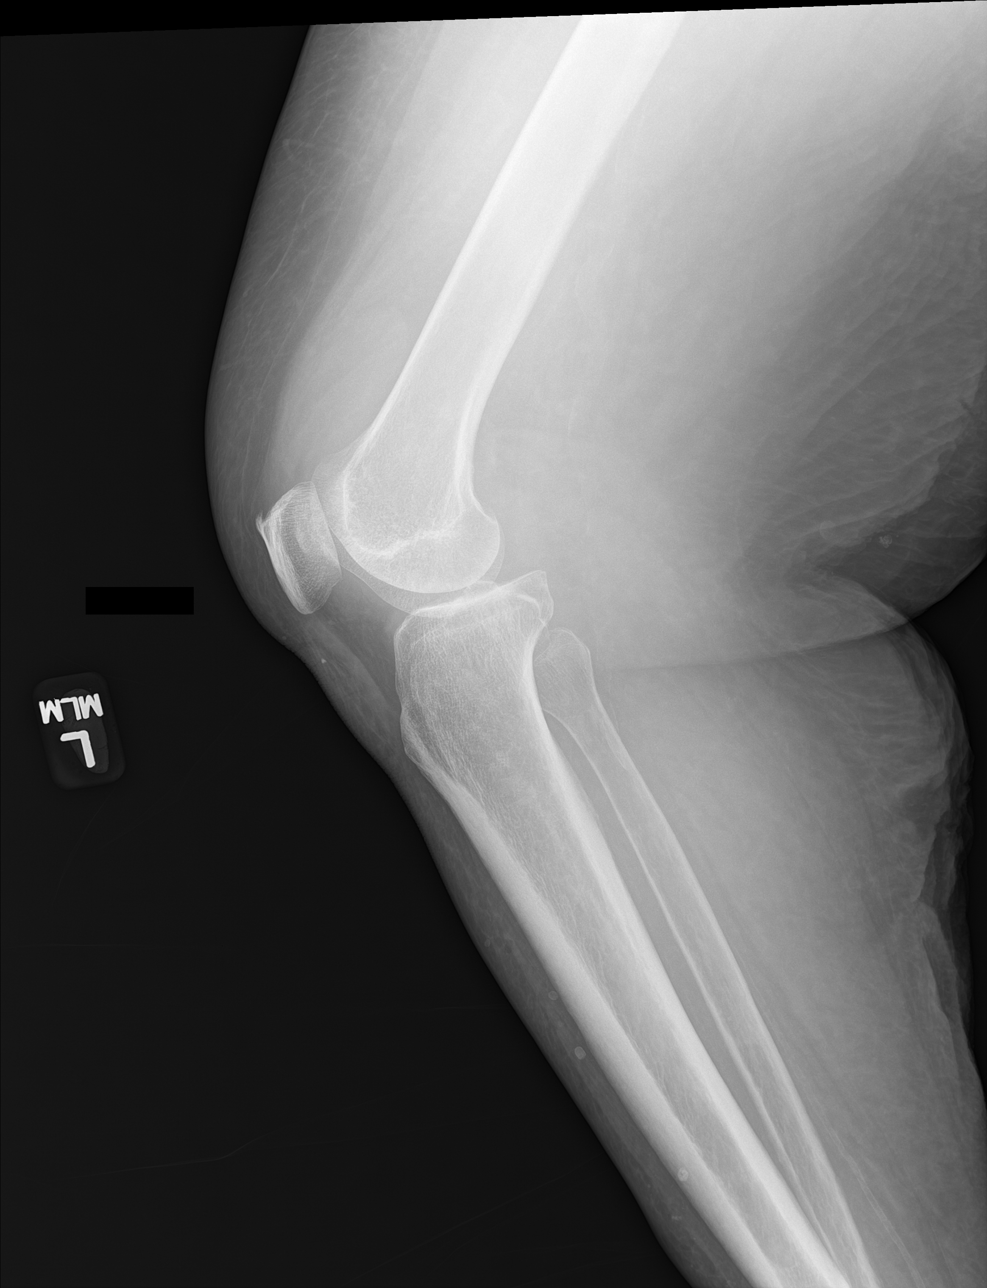

[4 of 4 positions shown; findings below may reference images not displayed]

FINDINGS: No acute fracture or dislocation. Large joint effusion, increased
from prior. Joint spaces are preserved. Bone mineralization is
normal. Diffuse soft tissue swelling.
IMPRESSION: 1. Large joint effusion. No acute osseous abnormality.
